# Patient Record
Sex: Female | Born: 1955 | Race: White | Hispanic: No | Smoking: Former smoker
Health system: Southern US, Community
[De-identification: ages and names within clinical notes are randomized; demographics above are authoritative.]

## PROBLEM LIST (undated history)

## (undated) DIAGNOSIS — I214 Non-ST elevation (NSTEMI) myocardial infarction: Secondary | ICD-10-CM

## (undated) DIAGNOSIS — H269 Unspecified cataract: Secondary | ICD-10-CM

## (undated) DIAGNOSIS — J9601 Acute respiratory failure with hypoxia: Secondary | ICD-10-CM

## (undated) DIAGNOSIS — I10 Essential (primary) hypertension: Secondary | ICD-10-CM

## (undated) DIAGNOSIS — N814 Uterovaginal prolapse, unspecified: Secondary | ICD-10-CM

## (undated) DIAGNOSIS — E860 Dehydration: Secondary | ICD-10-CM

## (undated) DIAGNOSIS — D649 Anemia, unspecified: Secondary | ICD-10-CM

## (undated) DIAGNOSIS — Z9289 Personal history of other medical treatment: Secondary | ICD-10-CM

## (undated) DIAGNOSIS — N179 Acute kidney failure, unspecified: Secondary | ICD-10-CM

## (undated) DIAGNOSIS — K429 Umbilical hernia without obstruction or gangrene: Secondary | ICD-10-CM

## (undated) DIAGNOSIS — E785 Hyperlipidemia, unspecified: Secondary | ICD-10-CM

## (undated) DIAGNOSIS — M1710 Unilateral primary osteoarthritis, unspecified knee: Secondary | ICD-10-CM

## (undated) DIAGNOSIS — G4730 Sleep apnea, unspecified: Secondary | ICD-10-CM

## (undated) DIAGNOSIS — Z955 Presence of coronary angioplasty implant and graft: Secondary | ICD-10-CM

## (undated) DIAGNOSIS — I639 Cerebral infarction, unspecified: Secondary | ICD-10-CM

## (undated) DIAGNOSIS — E119 Type 2 diabetes mellitus without complications: Secondary | ICD-10-CM

## (undated) DIAGNOSIS — I25110 Atherosclerotic heart disease of native coronary artery with unstable angina pectoris: Secondary | ICD-10-CM

## (undated) HISTORY — DX: Acute kidney failure, unspecified: N17.9

## (undated) HISTORY — DX: Essential (primary) hypertension: I10

## (undated) HISTORY — PX: COLONOSCOPY: SHX174

## (undated) HISTORY — DX: Dehydration: E86.0

## (undated) HISTORY — DX: Acute respiratory failure with hypoxia: J96.01

## (undated) HISTORY — PX: TUBAL LIGATION: SHX77

## (undated) HISTORY — DX: Umbilical hernia without obstruction or gangrene: K42.9

## (undated) HISTORY — DX: Unilateral primary osteoarthritis, unspecified knee: M17.10

## (undated) HISTORY — DX: Uterovaginal prolapse, unspecified: N81.4

## (undated) HISTORY — DX: Type 2 diabetes mellitus without complications: E11.9

## (undated) HISTORY — DX: Sleep apnea, unspecified: G47.30

## (undated) HISTORY — DX: Hyperlipidemia, unspecified: E78.5

## (undated) HISTORY — DX: Unspecified cataract: H26.9

---

## 1997-06-28 ENCOUNTER — Emergency Department (HOSPITAL_COMMUNITY): Admission: EM | Admit: 1997-06-28 | Discharge: 1997-06-29 | Payer: Self-pay | Admitting: Emergency Medicine

## 1998-05-15 ENCOUNTER — Emergency Department (HOSPITAL_COMMUNITY): Admission: EM | Admit: 1998-05-15 | Discharge: 1998-05-15 | Payer: Self-pay | Admitting: Emergency Medicine

## 1999-03-03 ENCOUNTER — Emergency Department (HOSPITAL_COMMUNITY): Admission: EM | Admit: 1999-03-03 | Discharge: 1999-03-03 | Payer: Self-pay | Admitting: Emergency Medicine

## 2004-08-02 ENCOUNTER — Ambulatory Visit: Payer: Self-pay | Admitting: Internal Medicine

## 2004-08-06 ENCOUNTER — Ambulatory Visit: Payer: Self-pay | Admitting: *Deleted

## 2004-09-06 ENCOUNTER — Ambulatory Visit: Payer: Self-pay | Admitting: Internal Medicine

## 2004-09-09 ENCOUNTER — Ambulatory Visit (HOSPITAL_COMMUNITY): Admission: RE | Admit: 2004-09-09 | Discharge: 2004-09-09 | Payer: Self-pay | Admitting: Internal Medicine

## 2004-09-25 ENCOUNTER — Inpatient Hospital Stay (HOSPITAL_COMMUNITY): Admission: EM | Admit: 2004-09-25 | Discharge: 2004-09-30 | Payer: Self-pay | Admitting: Emergency Medicine

## 2004-10-04 ENCOUNTER — Ambulatory Visit: Payer: Self-pay | Admitting: Internal Medicine

## 2004-10-10 ENCOUNTER — Encounter: Payer: Self-pay | Admitting: Obstetrics and Gynecology

## 2004-10-10 ENCOUNTER — Ambulatory Visit: Payer: Self-pay | Admitting: Obstetrics and Gynecology

## 2004-10-22 ENCOUNTER — Ambulatory Visit (HOSPITAL_COMMUNITY): Admission: RE | Admit: 2004-10-22 | Discharge: 2004-10-22 | Payer: Self-pay | Admitting: Urology

## 2005-01-17 ENCOUNTER — Ambulatory Visit: Payer: Self-pay | Admitting: Internal Medicine

## 2005-01-21 ENCOUNTER — Ambulatory Visit (HOSPITAL_COMMUNITY): Admission: RE | Admit: 2005-01-21 | Discharge: 2005-01-21 | Payer: Self-pay | Admitting: Internal Medicine

## 2005-02-07 ENCOUNTER — Ambulatory Visit: Payer: Self-pay | Admitting: Internal Medicine

## 2005-04-14 ENCOUNTER — Ambulatory Visit: Payer: Self-pay | Admitting: Internal Medicine

## 2005-05-15 ENCOUNTER — Ambulatory Visit: Payer: Self-pay | Admitting: *Deleted

## 2006-01-20 DIAGNOSIS — N814 Uterovaginal prolapse, unspecified: Secondary | ICD-10-CM

## 2006-01-20 HISTORY — DX: Uterovaginal prolapse, unspecified: N81.4

## 2006-03-19 ENCOUNTER — Ambulatory Visit: Payer: Self-pay | Admitting: Internal Medicine

## 2006-04-02 ENCOUNTER — Ambulatory Visit: Payer: Self-pay | Admitting: Internal Medicine

## 2006-04-03 ENCOUNTER — Ambulatory Visit (HOSPITAL_COMMUNITY): Admission: RE | Admit: 2006-04-03 | Discharge: 2006-04-03 | Payer: Self-pay | Admitting: Internal Medicine

## 2006-04-16 ENCOUNTER — Ambulatory Visit: Payer: Self-pay | Admitting: Internal Medicine

## 2006-04-30 ENCOUNTER — Encounter (HOSPITAL_COMMUNITY): Admission: RE | Admit: 2006-04-30 | Discharge: 2006-05-01 | Payer: Self-pay | Admitting: Cardiology

## 2006-05-07 ENCOUNTER — Ambulatory Visit: Payer: Self-pay | Admitting: Internal Medicine

## 2006-05-14 ENCOUNTER — Ambulatory Visit (HOSPITAL_COMMUNITY): Admission: RE | Admit: 2006-05-14 | Discharge: 2006-05-14 | Payer: Self-pay | Admitting: Cardiology

## 2006-05-21 ENCOUNTER — Ambulatory Visit: Payer: Self-pay | Admitting: Family Medicine

## 2006-05-25 ENCOUNTER — Emergency Department (HOSPITAL_COMMUNITY): Admission: EM | Admit: 2006-05-25 | Discharge: 2006-05-25 | Payer: Self-pay | Admitting: Emergency Medicine

## 2006-10-07 ENCOUNTER — Encounter (INDEPENDENT_AMBULATORY_CARE_PROVIDER_SITE_OTHER): Payer: Self-pay | Admitting: *Deleted

## 2007-04-02 ENCOUNTER — Ambulatory Visit: Payer: Self-pay | Admitting: Nurse Practitioner

## 2007-04-02 DIAGNOSIS — E785 Hyperlipidemia, unspecified: Secondary | ICD-10-CM

## 2007-04-02 DIAGNOSIS — M1710 Unilateral primary osteoarthritis, unspecified knee: Secondary | ICD-10-CM

## 2007-04-02 DIAGNOSIS — I10 Essential (primary) hypertension: Secondary | ICD-10-CM

## 2007-04-02 DIAGNOSIS — E1169 Type 2 diabetes mellitus with other specified complication: Secondary | ICD-10-CM

## 2007-04-02 DIAGNOSIS — E1159 Type 2 diabetes mellitus with other circulatory complications: Secondary | ICD-10-CM | POA: Diagnosis present

## 2007-04-05 LAB — CONVERTED CEMR LAB
ALT: 24 units/L (ref 0–35)
AST: 22 units/L (ref 0–37)
Albumin: 4.1 g/dL (ref 3.5–5.2)
Alkaline Phosphatase: 97 units/L (ref 39–117)
BUN: 11 mg/dL (ref 6–23)
Basophils Absolute: 0 10*3/uL (ref 0.0–0.1)
Basophils Relative: 1 % (ref 0–1)
CO2: 25 meq/L (ref 19–32)
Calcium: 9 mg/dL (ref 8.4–10.5)
Chloride: 100 meq/L (ref 96–112)
Creatinine, Ser: 0.95 mg/dL (ref 0.40–1.20)
Eosinophils Absolute: 0.4 10*3/uL (ref 0.0–0.7)
Eosinophils Relative: 6 % — ABNORMAL HIGH (ref 0–5)
Glucose, Bld: 192 mg/dL — ABNORMAL HIGH (ref 70–99)
HCT: 44.9 % (ref 36.0–46.0)
Hemoglobin: 14.1 g/dL (ref 12.0–15.0)
Lymphocytes Relative: 36 % (ref 12–46)
Lymphs Abs: 2.3 10*3/uL (ref 0.7–4.0)
MCHC: 31.4 g/dL (ref 30.0–36.0)
MCV: 89.1 fL (ref 78.0–100.0)
Monocytes Absolute: 0.5 10*3/uL (ref 0.1–1.0)
Monocytes Relative: 8 % (ref 3–12)
Neutro Abs: 3 10*3/uL (ref 1.7–7.7)
Neutrophils Relative %: 48 % (ref 43–77)
Platelets: 248 10*3/uL (ref 150–400)
Potassium: 4.1 meq/L (ref 3.5–5.3)
RBC: 5.04 M/uL (ref 3.87–5.11)
RDW: 14.9 % (ref 11.5–15.5)
Sodium: 139 meq/L (ref 135–145)
TSH: 1.107 microintl units/mL (ref 0.350–5.50)
Total Bilirubin: 0.3 mg/dL (ref 0.3–1.2)
Total Protein: 7.5 g/dL (ref 6.0–8.3)
WBC: 6.2 10*3/uL (ref 4.0–10.5)

## 2007-04-19 ENCOUNTER — Ambulatory Visit: Payer: Self-pay | Admitting: Nurse Practitioner

## 2007-04-19 LAB — CONVERTED CEMR LAB
Blood Glucose, AC Bkfst: 221 mg/dL
Hgb A1c MFr Bld: 9.8 %

## 2007-04-20 LAB — CONVERTED CEMR LAB
Cholesterol: 212 mg/dL — ABNORMAL HIGH (ref 0–200)
HDL: 35 mg/dL — ABNORMAL LOW (ref >39)
LDL Cholesterol: 142 mg/dL — ABNORMAL HIGH (ref 0–99)
Total CHOL/HDL Ratio: 6.1
Triglycerides: 173 mg/dL — ABNORMAL HIGH (ref <150)
VLDL: 35 mg/dL (ref 0–40)

## 2007-04-21 ENCOUNTER — Telehealth (INDEPENDENT_AMBULATORY_CARE_PROVIDER_SITE_OTHER): Payer: Self-pay | Admitting: *Deleted

## 2007-05-25 ENCOUNTER — Ambulatory Visit: Payer: Self-pay | Admitting: Internal Medicine

## 2007-05-25 DIAGNOSIS — E119 Type 2 diabetes mellitus without complications: Secondary | ICD-10-CM

## 2007-05-25 DIAGNOSIS — Z794 Long term (current) use of insulin: Secondary | ICD-10-CM

## 2007-05-25 DIAGNOSIS — E1165 Type 2 diabetes mellitus with hyperglycemia: Secondary | ICD-10-CM

## 2007-05-25 LAB — CONVERTED CEMR LAB: Blood Glucose, Fingerstick: 225

## 2007-05-31 ENCOUNTER — Ambulatory Visit: Payer: Self-pay | Admitting: Internal Medicine

## 2007-06-06 LAB — CONVERTED CEMR LAB
BUN: 19 mg/dL (ref 6–23)
CO2: 24 meq/L (ref 19–32)
Calcium: 9 mg/dL (ref 8.4–10.5)
Chloride: 101 meq/L (ref 96–112)
Creatinine, Ser: 0.98 mg/dL (ref 0.40–1.20)
Glucose, Bld: 151 mg/dL — ABNORMAL HIGH (ref 70–99)
Potassium: 4.3 meq/L (ref 3.5–5.3)
Sodium: 138 meq/L (ref 135–145)

## 2007-06-07 ENCOUNTER — Encounter (INDEPENDENT_AMBULATORY_CARE_PROVIDER_SITE_OTHER): Payer: Self-pay | Admitting: *Deleted

## 2007-06-08 ENCOUNTER — Ambulatory Visit: Payer: Self-pay | Admitting: Internal Medicine

## 2007-06-22 ENCOUNTER — Ambulatory Visit: Payer: Self-pay | Admitting: Family Medicine

## 2007-06-22 ENCOUNTER — Encounter (INDEPENDENT_AMBULATORY_CARE_PROVIDER_SITE_OTHER): Payer: Self-pay | Admitting: Internal Medicine

## 2007-08-19 ENCOUNTER — Ambulatory Visit: Payer: Self-pay | Admitting: Internal Medicine

## 2007-08-19 LAB — CONVERTED CEMR LAB
Blood Glucose, Fingerstick: 203
Hgb A1c MFr Bld: 9.5 %

## 2007-10-05 ENCOUNTER — Ambulatory Visit: Payer: Self-pay | Admitting: Internal Medicine

## 2007-10-05 LAB — CONVERTED CEMR LAB
Bilirubin Urine: NEGATIVE
Blood Glucose, Fingerstick: 141
Blood in Urine, dipstick: NEGATIVE
Glucose, Urine, Semiquant: NEGATIVE
Ketones, urine, test strip: NEGATIVE
Nitrite: NEGATIVE
Protein, U semiquant: NEGATIVE
Specific Gravity, Urine: <1.005
Urobilinogen, UA: 0.2
WBC Urine, dipstick: NEGATIVE
pH: 7

## 2007-10-14 LAB — CONVERTED CEMR LAB
ALT: 22 units/L (ref 0–35)
AST: 17 units/L (ref 0–37)
Albumin: 4.2 g/dL (ref 3.5–5.2)
Alkaline Phosphatase: 82 units/L (ref 39–117)
Bilirubin, Direct: 0.1 mg/dL (ref 0.0–0.3)
Cholesterol: 126 mg/dL (ref 0–200)
HDL: 39 mg/dL — ABNORMAL LOW (ref >39)
Indirect Bilirubin: 0.3 mg/dL (ref 0.0–0.9)
LDL Cholesterol: 55 mg/dL (ref 0–99)
Microalb, Ur: 2.1 mg/dL — ABNORMAL HIGH (ref 0.00–1.89)
Total Bilirubin: 0.4 mg/dL (ref 0.3–1.2)
Total CHOL/HDL Ratio: 3.2
Total Protein: 7.4 g/dL (ref 6.0–8.3)
Triglycerides: 160 mg/dL — ABNORMAL HIGH (ref <150)
VLDL: 32 mg/dL (ref 0–40)

## 2007-10-21 ENCOUNTER — Encounter: Admission: RE | Admit: 2007-10-21 | Discharge: 2007-12-02 | Payer: Self-pay | Admitting: Internal Medicine

## 2007-10-21 ENCOUNTER — Encounter (INDEPENDENT_AMBULATORY_CARE_PROVIDER_SITE_OTHER): Payer: Self-pay | Admitting: Internal Medicine

## 2007-12-02 ENCOUNTER — Encounter (INDEPENDENT_AMBULATORY_CARE_PROVIDER_SITE_OTHER): Payer: Self-pay | Admitting: Internal Medicine

## 2008-02-18 ENCOUNTER — Ambulatory Visit: Payer: Self-pay | Admitting: Internal Medicine

## 2008-05-15 ENCOUNTER — Encounter (INDEPENDENT_AMBULATORY_CARE_PROVIDER_SITE_OTHER): Payer: Self-pay | Admitting: Internal Medicine

## 2008-06-21 ENCOUNTER — Encounter (INDEPENDENT_AMBULATORY_CARE_PROVIDER_SITE_OTHER): Payer: Self-pay | Admitting: Internal Medicine

## 2008-07-07 ENCOUNTER — Encounter (INDEPENDENT_AMBULATORY_CARE_PROVIDER_SITE_OTHER): Payer: Self-pay | Admitting: Internal Medicine

## 2008-07-07 ENCOUNTER — Ambulatory Visit: Payer: Self-pay | Admitting: Internal Medicine

## 2008-07-07 DIAGNOSIS — F329 Major depressive disorder, single episode, unspecified: Secondary | ICD-10-CM

## 2008-07-07 LAB — CONVERTED CEMR LAB
Bilirubin Urine: NEGATIVE
Blood Glucose, Fingerstick: 99
Blood in Urine, dipstick: NEGATIVE
Chlamydia, DNA Probe: NEGATIVE
GC Probe Amp, Genital: NEGATIVE
Glucose, Urine, Semiquant: NEGATIVE
Hgb A1c MFr Bld: 8.7 %
Ketones, urine, test strip: NEGATIVE
Nitrite: NEGATIVE
Protein, U semiquant: 100
Specific Gravity, Urine: 1.01
Urobilinogen, UA: 0.2
WBC Urine, dipstick: NEGATIVE
pH: 7.5

## 2008-07-13 ENCOUNTER — Encounter (INDEPENDENT_AMBULATORY_CARE_PROVIDER_SITE_OTHER): Payer: Self-pay | Admitting: Internal Medicine

## 2008-07-21 ENCOUNTER — Ambulatory Visit (HOSPITAL_COMMUNITY): Admission: RE | Admit: 2008-07-21 | Discharge: 2008-07-21 | Payer: Self-pay | Admitting: Internal Medicine

## 2008-08-09 ENCOUNTER — Ambulatory Visit: Payer: Self-pay | Admitting: Internal Medicine

## 2008-08-21 LAB — CONVERTED CEMR LAB
ALT: 27 U/L
AST: 25 U/L
Albumin: 3.9 g/dL
Alkaline Phosphatase: 76 U/L
BUN: 11 mg/dL
Basophils Absolute: 0.1 10*3/uL
Basophils Relative: 1 %
CO2: 27 meq/L
Calcium: 9.2 mg/dL
Chloride: 105 meq/L
Cholesterol: 207 mg/dL — ABNORMAL HIGH
Creatinine, Ser: 0.94 mg/dL
Eosinophils Absolute: 0.2 10*3/uL
Eosinophils Relative: 4 %
Glucose, Bld: 155 mg/dL — ABNORMAL HIGH
HCT: 43.3 %
HDL: 39 mg/dL — ABNORMAL LOW
Hemoglobin: 13.6 g/dL
LDL Cholesterol: 133 mg/dL — ABNORMAL HIGH
Lymphocytes Relative: 41 %
Lymphs Abs: 2.5 10*3/uL
MCHC: 31.4 g/dL
MCV: 89.5 fL
Microalb, Ur: 8.82 mg/dL — ABNORMAL HIGH
Monocytes Absolute: 0.5 10*3/uL
Monocytes Relative: 8 %
Neutro Abs: 2.9 10*3/uL
Neutrophils Relative %: 47 %
Platelets: 261 10*3/uL
Potassium: 4.4 meq/L
RBC: 4.84 M/uL
RDW: 16.2 % — ABNORMAL HIGH
Sodium: 142 meq/L
Total Bilirubin: 0.4 mg/dL
Total CHOL/HDL Ratio: 5.3
Total Protein: 7.2 g/dL
Triglycerides: 173 mg/dL — ABNORMAL HIGH
VLDL: 35 mg/dL
WBC: 6.2 10*3/uL

## 2008-09-07 ENCOUNTER — Ambulatory Visit: Payer: Self-pay | Admitting: Internal Medicine

## 2008-09-07 LAB — CONVERTED CEMR LAB: Blood Glucose, Fingerstick: 176

## 2008-09-21 ENCOUNTER — Ambulatory Visit: Payer: Self-pay | Admitting: Internal Medicine

## 2008-09-28 ENCOUNTER — Ambulatory Visit: Payer: Self-pay | Admitting: Internal Medicine

## 2008-10-19 ENCOUNTER — Ambulatory Visit: Payer: Self-pay | Admitting: Internal Medicine

## 2008-10-19 LAB — CONVERTED CEMR LAB
ALT: 29 units/L (ref 0–35)
AST: 21 units/L (ref 0–37)
Albumin: 4.1 g/dL (ref 3.5–5.2)
Alkaline Phosphatase: 71 units/L (ref 39–117)
Bilirubin, Direct: 0.1 mg/dL (ref 0.0–0.3)
Cholesterol: 113 mg/dL (ref 0–200)
HDL: 39 mg/dL — ABNORMAL LOW (ref >39)
Indirect Bilirubin: 0.3 mg/dL (ref 0.0–0.9)
LDL Cholesterol: 54 mg/dL (ref 0–99)
Total Bilirubin: 0.4 mg/dL (ref 0.3–1.2)
Total CHOL/HDL Ratio: 2.9
Total Protein: 7.2 g/dL (ref 6.0–8.3)
Triglycerides: 100 mg/dL (ref <150)
VLDL: 20 mg/dL (ref 0–40)

## 2008-11-01 ENCOUNTER — Encounter (INDEPENDENT_AMBULATORY_CARE_PROVIDER_SITE_OTHER): Payer: Self-pay | Admitting: Internal Medicine

## 2009-01-20 HISTORY — PX: CARDIAC CATHETERIZATION: SHX172

## 2009-01-26 ENCOUNTER — Ambulatory Visit: Payer: Self-pay | Admitting: Internal Medicine

## 2009-01-26 DIAGNOSIS — K439 Ventral hernia without obstruction or gangrene: Secondary | ICD-10-CM | POA: Insufficient documentation

## 2009-01-26 HISTORY — DX: Ventral hernia without obstruction or gangrene: K43.9

## 2009-01-26 LAB — CONVERTED CEMR LAB: Blood Glucose, Fingerstick: 96

## 2009-01-31 LAB — CONVERTED CEMR LAB
Hgb A1c MFr Bld: 8.3 % — ABNORMAL HIGH (ref 4.6–6.1)
Microalb, Ur: 13.01 mg/dL — ABNORMAL HIGH (ref 0.00–1.89)

## 2009-02-09 ENCOUNTER — Ambulatory Visit: Payer: Self-pay | Admitting: Internal Medicine

## 2009-02-23 LAB — CONVERTED CEMR LAB
BUN: 15 mg/dL (ref 6–23)
CO2: 25 meq/L (ref 19–32)
Calcium: 9.4 mg/dL (ref 8.4–10.5)
Chloride: 103 meq/L (ref 96–112)
Creatinine, Ser: 0.91 mg/dL (ref 0.40–1.20)
Glucose, Bld: 91 mg/dL (ref 70–99)
Potassium: 4.1 meq/L (ref 3.5–5.3)
Sodium: 141 meq/L (ref 135–145)

## 2009-03-05 ENCOUNTER — Ambulatory Visit (HOSPITAL_COMMUNITY): Admission: RE | Admit: 2009-03-05 | Discharge: 2009-03-05 | Payer: Self-pay | Admitting: Family Medicine

## 2009-03-09 ENCOUNTER — Ambulatory Visit: Payer: Self-pay | Admitting: Internal Medicine

## 2009-03-09 LAB — CONVERTED CEMR LAB: Blood Glucose, Fingerstick: 100

## 2009-03-23 ENCOUNTER — Ambulatory Visit: Payer: Self-pay | Admitting: Internal Medicine

## 2009-03-23 LAB — CONVERTED CEMR LAB
BUN: 15 mg/dL (ref 6–23)
CO2: 29 meq/L (ref 19–32)
Calcium: 8.8 mg/dL (ref 8.4–10.5)
Chloride: 102 meq/L (ref 96–112)
Creatinine, Ser: 0.84 mg/dL (ref 0.40–1.20)
Glucose, Bld: 117 mg/dL — ABNORMAL HIGH (ref 70–99)
Potassium: 3.7 meq/L (ref 3.5–5.3)
Sodium: 142 meq/L (ref 135–145)

## 2009-03-26 ENCOUNTER — Encounter: Admission: RE | Admit: 2009-03-26 | Discharge: 2009-03-26 | Payer: Self-pay | Admitting: Internal Medicine

## 2009-03-27 ENCOUNTER — Encounter (INDEPENDENT_AMBULATORY_CARE_PROVIDER_SITE_OTHER): Payer: Self-pay | Admitting: Internal Medicine

## 2009-03-30 ENCOUNTER — Telehealth (INDEPENDENT_AMBULATORY_CARE_PROVIDER_SITE_OTHER): Payer: Self-pay | Admitting: Internal Medicine

## 2009-04-06 ENCOUNTER — Ambulatory Visit: Payer: Self-pay | Admitting: Internal Medicine

## 2009-08-10 ENCOUNTER — Telehealth (INDEPENDENT_AMBULATORY_CARE_PROVIDER_SITE_OTHER): Payer: Self-pay | Admitting: Internal Medicine

## 2009-08-10 ENCOUNTER — Ambulatory Visit: Payer: Self-pay | Admitting: Internal Medicine

## 2009-08-10 DIAGNOSIS — G4733 Obstructive sleep apnea (adult) (pediatric): Secondary | ICD-10-CM

## 2009-08-10 DIAGNOSIS — E049 Nontoxic goiter, unspecified: Secondary | ICD-10-CM | POA: Insufficient documentation

## 2009-08-10 LAB — CONVERTED CEMR LAB
Bilirubin Urine: NEGATIVE
Blood Glucose, Fingerstick: 97
Blood in Urine, dipstick: NEGATIVE
Chlamydia, DNA Probe: NEGATIVE
GC Probe Amp, Genital: NEGATIVE
Glucose, Urine, Semiquant: NEGATIVE
Hgb A1c MFr Bld: 7.7 %
KOH Prep: NEGATIVE
Ketones, urine, test strip: NEGATIVE
Microalb, Ur: 11.07 mg/dL — ABNORMAL HIGH (ref 0.00–1.89)
Nitrite: NEGATIVE
Specific Gravity, Urine: 1.01
TSH: 1.175 microintl units/mL (ref 0.350–4.500)
Urobilinogen, UA: NEGATIVE
WBC Urine, dipstick: NEGATIVE
Whiff Test: POSITIVE
pH: 6.5

## 2009-08-14 ENCOUNTER — Encounter (INDEPENDENT_AMBULATORY_CARE_PROVIDER_SITE_OTHER): Payer: Self-pay | Admitting: Internal Medicine

## 2009-08-14 ENCOUNTER — Ambulatory Visit (HOSPITAL_COMMUNITY): Admission: RE | Admit: 2009-08-14 | Discharge: 2009-08-14 | Payer: Self-pay | Admitting: Internal Medicine

## 2009-08-15 ENCOUNTER — Encounter (INDEPENDENT_AMBULATORY_CARE_PROVIDER_SITE_OTHER): Payer: Self-pay | Admitting: Internal Medicine

## 2009-08-17 ENCOUNTER — Ambulatory Visit (HOSPITAL_COMMUNITY): Admission: RE | Admit: 2009-08-17 | Discharge: 2009-08-17 | Payer: Self-pay | Admitting: Internal Medicine

## 2009-08-18 ENCOUNTER — Encounter (INDEPENDENT_AMBULATORY_CARE_PROVIDER_SITE_OTHER): Payer: Self-pay | Admitting: Internal Medicine

## 2009-08-29 ENCOUNTER — Encounter (INDEPENDENT_AMBULATORY_CARE_PROVIDER_SITE_OTHER): Payer: Self-pay | Admitting: *Deleted

## 2009-08-31 ENCOUNTER — Ambulatory Visit: Payer: Self-pay | Admitting: Internal Medicine

## 2009-09-06 ENCOUNTER — Telehealth: Payer: Self-pay | Admitting: Internal Medicine

## 2009-09-07 ENCOUNTER — Ambulatory Visit: Payer: Self-pay | Admitting: Internal Medicine

## 2009-09-10 ENCOUNTER — Encounter: Payer: Self-pay | Admitting: Internal Medicine

## 2009-09-13 DIAGNOSIS — D126 Benign neoplasm of colon, unspecified: Secondary | ICD-10-CM

## 2009-09-13 HISTORY — DX: Benign neoplasm of colon, unspecified: D12.6

## 2009-09-14 ENCOUNTER — Ambulatory Visit: Payer: Self-pay | Admitting: Internal Medicine

## 2009-09-14 LAB — CONVERTED CEMR LAB: Blood Glucose, Fingerstick: 114

## 2009-10-05 ENCOUNTER — Emergency Department (HOSPITAL_COMMUNITY): Admission: EM | Admit: 2009-10-05 | Discharge: 2009-10-05 | Payer: Self-pay | Admitting: Emergency Medicine

## 2009-10-11 ENCOUNTER — Ambulatory Visit (HOSPITAL_BASED_OUTPATIENT_CLINIC_OR_DEPARTMENT_OTHER): Admission: RE | Admit: 2009-10-11 | Discharge: 2009-10-11 | Payer: Self-pay | Admitting: Internal Medicine

## 2009-10-11 ENCOUNTER — Encounter (INDEPENDENT_AMBULATORY_CARE_PROVIDER_SITE_OTHER): Payer: Self-pay | Admitting: Internal Medicine

## 2009-10-12 ENCOUNTER — Encounter (INDEPENDENT_AMBULATORY_CARE_PROVIDER_SITE_OTHER): Payer: Self-pay | Admitting: Internal Medicine

## 2009-10-13 ENCOUNTER — Ambulatory Visit: Payer: Self-pay | Admitting: Internal Medicine

## 2009-10-22 ENCOUNTER — Ambulatory Visit: Payer: Self-pay | Admitting: Internal Medicine

## 2009-10-26 ENCOUNTER — Ambulatory Visit: Payer: Self-pay | Admitting: Internal Medicine

## 2009-11-24 ENCOUNTER — Encounter (INDEPENDENT_AMBULATORY_CARE_PROVIDER_SITE_OTHER): Payer: Self-pay | Admitting: Internal Medicine

## 2009-11-28 ENCOUNTER — Encounter (INDEPENDENT_AMBULATORY_CARE_PROVIDER_SITE_OTHER): Payer: Self-pay | Admitting: Internal Medicine

## 2010-01-02 ENCOUNTER — Encounter (INDEPENDENT_AMBULATORY_CARE_PROVIDER_SITE_OTHER): Payer: Self-pay | Admitting: Internal Medicine

## 2010-01-18 ENCOUNTER — Ambulatory Visit: Payer: Self-pay | Admitting: Internal Medicine

## 2010-01-18 LAB — CONVERTED CEMR LAB
Blood Glucose, Fingerstick: 114
Cholesterol, target level: 200 mg/dL
Hgb A1c MFr Bld: 7.5 %

## 2010-02-01 ENCOUNTER — Ambulatory Visit
Admission: RE | Admit: 2010-02-01 | Discharge: 2010-02-01 | Payer: Self-pay | Source: Home / Self Care | Attending: Internal Medicine | Admitting: Internal Medicine

## 2010-02-01 ENCOUNTER — Encounter (INDEPENDENT_AMBULATORY_CARE_PROVIDER_SITE_OTHER): Payer: Self-pay | Admitting: Internal Medicine

## 2010-02-01 LAB — CONVERTED CEMR LAB
AST: 19 units/L (ref 0–37)
Albumin: 4.3 g/dL (ref 3.5–5.2)
Alkaline Phosphatase: 55 units/L (ref 39–117)
BUN: 15 mg/dL (ref 6–23)
Basophils Absolute: 0 10*3/uL (ref 0.0–0.1)
Basophils Relative: 1 % (ref 0–1)
Eosinophils Absolute: 0.3 10*3/uL (ref 0.0–0.7)
HDL: 40 mg/dL (ref >39)
LDL Cholesterol: 136 mg/dL — ABNORMAL HIGH (ref 0–99)
MCHC: 31.8 g/dL (ref 30.0–36.0)
MCV: 88.8 fL (ref 78.0–100.0)
Monocytes Relative: 8 % (ref 3–12)
Neutrophils Relative %: 46 % (ref 43–77)
Platelets: 250 10*3/uL (ref 150–400)
Potassium: 4.2 meq/L (ref 3.5–5.3)
RBC: 4.74 M/uL (ref 3.87–5.11)
RDW: 15 % (ref 11.5–15.5)
Total CHOL/HDL Ratio: 5.3

## 2010-02-19 NOTE — Miscellaneous (Signed)
Summary: [facility]  Clinical Lists Changes  Medications: Added new medication of MIRALAX   POWD (POLYETHYLENE GLYCOL 3350) As per prep  instructions. - Signed Added new medication of DULCOLAX 5 MG  TBEC (BISACODYL) Day before procedure take 2 at 3pm and 2 at 8pm. - Signed Added new medication of REGLAN 10 MG  TABS (METOCLOPRAMIDE HCL) As per prep instructions. - Signed Rx of MIRALAX   POWD (POLYETHYLENE GLYCOL 3350) As per prep  instructions.;  #255gm x 0;  Signed;  Entered by: Ezra Sites RN;  Authorized by: Hart Carwin MD;  Method used: Print then Give to Patient Rx of DULCOLAX 5 MG  TBEC (BISACODYL) Day before procedure take 2 at 3pm and 2 at 8pm.;  #4 x 0;  Signed;  Entered by: Ezra Sites RN;  Authorized by: Hart Carwin MD;  Method used: Print then Give to Patient Rx of REGLAN 10 MG  TABS (METOCLOPRAMIDE HCL) As per prep instructions.;  #2 x 0;  Signed;  Entered by: Ezra Sites RN;  Authorized by: Hart Carwin MD;  Method used: Print then Give to Patient Observations: Added new observation of NKA: T (08/31/2009 10:52)    Prescriptions: REGLAN 10 MG  TABS (METOCLOPRAMIDE HCL) As per prep instructions.  #2 x 0   Entered by:   Ezra Sites RN   Authorized by:   Hart Carwin MD   Signed by:   Ezra Sites RN on 08/31/2009   Method used:   Print then Give to Patient   RxID:   [id number] DULCOLAX 5 MG  TBEC (BISACODYL) Day before procedure take 2 at 3pm and 2 at 8pm.  #4 x 0   Entered by:   Ezra Sites RN   Authorized by:   Hart Carwin MD   Signed by:   Ezra Sites RN on 08/31/2009   Method used:   Print then Give to Patient   RxID:   [id number] MIRALAX   POWD (POLYETHYLENE GLYCOL 3350) As per prep  instructions.  #255gm x 0   Entered by:   Ezra Sites RN   Authorized by:   Hart Carwin MD   Signed by:   Ezra Sites RN on 08/31/2009   Method used:   Print then Give to Patient   RxID:   [id number]

## 2010-02-19 NOTE — Letter (Signed)
Summary: Diabetic Instructions  [facility]  [address] [place], [place] [address]   Phone: [phone]  Fax: [phone]    Holly Acosta [birth year] MRN: [id number]   _  _   ORAL DIABETIC MEDICATION INSTRUCTIONS  The day before your procedure:   Take your diabetic pill as you do normally  The day of your procedure:   Do not take your diabetic pill    We will check your blood sugar levels during the admission process and again in Recovery before discharging you home  ________________________________________________________________________

## 2010-02-19 NOTE — Progress Notes (Signed)
Summary: Office Visit//DEPRESSION SCREENNING  Office Visit//DEPRESSION SCREENNING   Imported By: Arta Bruce 08/13/2009 09:29:10  _____________________________________________________________________  External Attachment:    Type:   Image     Comment:   External Document

## 2010-02-19 NOTE — Assessment & Plan Note (Signed)
Summary: FOLLOW UP WITH BMET/ V58.69 IN 2-3 WEEKS NURSE VISIT//GK  Nurse Visit   Allergies: No Known Drug Allergies

## 2010-02-19 NOTE — Assessment & Plan Note (Signed)
Summary: FOLLOW UP WITH DR Little Bashore IN 6 WEEKS BP//GK   Vital Signs:  Patient profile:   55 year old female Weight:      189 pounds Temp:     97.8 degrees F Pulse rate:   54 / minute Pulse rhythm:   regular Resp:     18 per minute BP sitting:   157 / 81  (left arm) Cuff size:   large  Vitals Entered By: Vesta Mixer CMA (March 09, 2009 11:15 AM) CC: f/u bp Is Patient Diabetic? Yes Pain Assessment Patient in pain? no      CBG Result 100  Does patient need assistance? Ambulation Normal   Primary Care Provider:  Yuval Rubens  CC:  f/u bp.  History of Present Illness: 1.  Hypertension:  Has been on 20 mg of Lisinopril for about 1 month.  Tolerated increase in Lisinopril fine.  2.  DM:  A1C as expected too high at last visit.  Pt. lost about 10 lb s since then working on diet.  Exercising on bike as well.  Taking meds regularly.  Current Medications (verified): 1)  Atenolol 50 Mg Tabs (Atenolol) .... Take One (1) Tablet Each Day 2)  Norvasc 10 Mg Tabs (Amlodipine Besylate) .... Take One (1) Tablet By Mouth Every Day 3)  Glucophage 500 Mg  Tabs (Metformin Hcl) .... 2 Tablets By Mouth Two Times A Day 4)  Glucometer Elite Classic   Kit (Blood Glucose Monitoring Suppl) .... Use To Check Sugars Two Times A Day 5)  Glucometer Elite Test   Strp (Glucose Blood) .... Two Times A Day Sugar Testing 6)  Lisinopril 20 Mg Tabs (Lisinopril) .Marland Kitchen.. 1 Tab By Mouth Daily 7)  Crestor 10 Mg  Tabs (Rosuvastatin Calcium) .Marland Kitchen.. 1 Tab By Mouth Daily 8)  Ibuprofen 600 Mg  Tabs (Ibuprofen) .Marland Kitchen.. 1 Tab By Mouth Q 6h With Food As Needed Pain 9)  Lovaza 1 Gm Caps (Omega-3-Acid Ethyl Esters) .... 4 Caps By Mouth Daily 10)  Glucotrol Xl 5 Mg Xr24h-Tab (Glipizide) .Marland Kitchen.. 1 Tab By Mouth Q Am  Allergies (verified): No Known Drug Allergies  Physical Exam  Lungs:  Normal respiratory effort, chest expands symmetrically. Lungs are clear to auscultation, no crackles or wheezes. Heart:  Normal rate and regular  rhythm. S1 and S2 normal without gallop, murmur, click, rub or other extra sounds.  Radial pulses normal and equal   Impression & Recommendations:  Problem # 1:  CRACKLES BILATERAL LUNG BASES (ICD-793.1) Resolved--CXR normal as well  Problem # 2:  DIABETES MELLITUS, TYPE II, UNCONTROLLED (ICD-250.02) Continue to work on lifestyle Her updated medication list for this problem includes:    Glucophage 500 Mg Tabs (Metformin hcl) .Marland Kitchen... 2 tablets by mouth two times a day    Lisinopril 20 Mg Tabs (Lisinopril) .Marland Kitchen... 1 tab by mouth daily    Glucotrol Xl 5 Mg Xr24h-tab (Glipizide) .Marland Kitchen... 1 tab by mouth q am  Problem # 3:  HYPERTENSION, BENIGN ESSENTIAL (ICD-401.1) Add HCTZ Her updated medication list for this problem includes:    Atenolol 50 Mg Tabs (Atenolol) .Marland Kitchen... Take one (1) tablet each day    Norvasc 10 Mg Tabs (Amlodipine besylate) .Marland Kitchen... Take one (1) tablet by mouth every day    Lisinopril 20 Mg Tabs (Lisinopril) .Marland Kitchen... 1 tab by mouth daily    Hydrochlorothiazide 12.5 Mg Caps (Hydrochlorothiazide) .Marland Kitchen... 1 tab or cap by mouth daily in morning  Complete Medication List: 1)  Atenolol 50 Mg Tabs (Atenolol) .... Take one (  1) tablet each day 2)  Norvasc 10 Mg Tabs (Amlodipine besylate) .... Take one (1) tablet by mouth every day 3)  Glucophage 500 Mg Tabs (Metformin hcl) .... 2 tablets by mouth two times a day 4)  Glucometer Elite Classic Kit (Blood glucose monitoring suppl) .... Use to check sugars two times a day 5)  Glucometer Elite Test Strp (Glucose blood) .... Two times a day sugar testing 6)  Lisinopril 20 Mg Tabs (Lisinopril) .Marland Kitchen.. 1 tab by mouth daily 7)  Crestor 10 Mg Tabs (Rosuvastatin calcium) .Marland Kitchen.. 1 tab by mouth daily 8)  Ibuprofen 600 Mg Tabs (Ibuprofen) .Marland Kitchen.. 1 tab by mouth q 6h with food as needed pain 9)  Lovaza 1 Gm Caps (Omega-3-acid ethyl esters) .... 4 caps by mouth daily 10)  Glucotrol Xl 5 Mg Xr24h-tab (Glipizide) .Marland Kitchen.. 1 tab by mouth q am 11)  Hydrochlorothiazide 12.5 Mg  Caps (Hydrochlorothiazide) .Marland Kitchen.. 1 tab or cap by mouth daily in morning  Other Orders: Capillary Blood Glucose/CBG (16109)  Patient Instructions: 1)  Follow up with Dr. Delrae Alfred on or after 07/07/09 for CPP 2)  Lab visit --fasting -- 2 days prior to CPP in June:  FLP, A1C, HIV, RPR, UA, urine microalbumin, CBC, CMET 3)  BMet and bp check --nurse visit in 2 weeks Prescriptions: HYDROCHLOROTHIAZIDE 12.5 MG CAPS (HYDROCHLOROTHIAZIDE) 1 tab or cap by mouth daily in morning  #30 x 11   Entered and Authorized by:   Julieanne Manson MD   Signed by:   Julieanne Manson MD on 03/09/2009   Method used:   Print then Give to Patient   RxID:   [id number]

## 2010-02-19 NOTE — Letter (Signed)
Summary: MAILED REQUESTED RECORDS TO [facility]  MAILED REQUESTED RECORDS TO [facility]   Imported By: Arta Bruce 10/12/2009 15:33:11  _____________________________________________________________________  External Attachment:    Type:   Image     Comment:   External Document

## 2010-02-19 NOTE — Progress Notes (Signed)
Summary: Question about procedure  Phone Note Call from Patient Call back at Home Phone [phone]   Caller: Patient Call For: Dr. Juanda Chance Reason for Call: Talk to Nurse Summary of Call: pt. ate some nuts and salad on Mon./Tues. Has her COL sch'd for 09-07-09 Initial call taken by: Karna Christmas,  September 06, 2009 8:08 AM  Follow-up for Phone Call        Patient  notified to keep her appointment for 09/07/09, to be on a clear liquid diet today. Follow-up by: Darcey Nora RN, CGRN,  September 06, 2009 8:48 AM

## 2010-02-19 NOTE — Assessment & Plan Note (Signed)
Summary: F/U DYSTHYMIA 1 MONTH PER DR MULBERRY/ NS   Vital Signs:  Patient profile:   55 year old female Height:      64.50 inches Weight:      185.5 pounds Temp:     97.1 degrees F oral Pulse rate:   40 / minute Pulse rhythm:   regular Resp:     16 per minute BP sitting:   156 / 90  (left arm) Cuff size:   regular  Vitals Entered By: Michelle Nasuti (September 14, 2009 10:39 AM) CC: PT PRESENTS TO CLINIC FOR DM AND HTN FOLLOW UP Is Patient Diabetic? Yes Did you bring your meter with you today? No Pain Assessment Patient in pain? no      CBG Result 114 CBG Device ID B NONFASTING  Does patient need assistance? Functional Status Self care Ambulation Normal   Primary Care Amea Mcphail:  [facility]  CC:  PT PRESENTS TO CLINIC FOR DM AND HTN FOLLOW UP.  History of Present Illness: 1.  Depression:  Scored 14 on PHQ 9 at CPP -- see ROS last visit.  Pt. did not take Zoloft after reading possible side effects--concerned she will become a "zombie"  while taking.  Long discussion today regarding side effects.  States she is now going to a church and trying meditation--wants to give that a try before considering medication.  Has had counseling in past--but not currently.  Refused at last visit.  No suicidal ideation.  Pt. now willing to go to [facility] for counseling.    2.  Hypertension:  Pt. cannot give a clear history as to whether she has been taking meds regularly.  Looks like she did not fill meds until 8/15 for most part, but then states she has been taking meds since her last visit on 7/22.  3.  DM:  as above--not clear how long has been back on meds.  Sugars have been running in 90 to low 100 range.  4.  Concern for sleep apnea and plans for colonoscopy--scheduled for September.  Habits & Providers  Alcohol-Tobacco-Diet     Tobacco Status: never  Current Medications (verified): 1)  Atenolol 50 Mg Tabs (Atenolol) .... Take One (1) Tablet Each Day 2)  Norvasc 10 Mg Tabs  (Amlodipine Besylate) .... Take One (1) Tablet By Mouth Every Day 3)  Glucophage 500 Mg  Tabs (Metformin Hcl) .... 2 Tablets By Mouth Two Times A Day 4)  Glucometer Elite Classic   Kit (Blood Glucose Monitoring Suppl) .... Use To Check Sugars Two Times A Day 5)  Glucometer Elite Test   Strp (Glucose Blood) .... Two Times A Day Sugar Testing 6)  Lisinopril 20 Mg Tabs (Lisinopril) .Marland Kitchen.. 1 Tab By Mouth Daily 7)  Crestor 10 Mg  Tabs (Rosuvastatin Calcium) .Marland Kitchen.. 1 Tab By Mouth Daily 8)  Lovaza 1 Gm Caps (Omega-3-Acid Ethyl Esters) .... 4 Caps By Mouth Daily 9)  Glucotrol Xl 5 Mg Xr24h-Tab (Glipizide) .Marland Kitchen.. 1 Tab By Mouth Q Am 10)  Hydrochlorothiazide 25 Mg Tabs (Hydrochlorothiazide) .Marland Kitchen.. 1 Tablet By Mouth Daily 11)  Zoloft 50 Mg Tabs (Sertraline Hcl) .... 1/2 Tab By Mouth Daily For 1 Week, Then 1 Tab By Mouth Daily 12)  Dish Pessary  Misc (Misc. Devices) 13)  Miralax   Powd (Polyethylene Glycol 3350) .... As Per Prep  Instructions. 14)  Dulcolax 5 Mg  Tbec (Bisacodyl) .... Day Before Procedure Take 2 At 3pm and 2 At 8pm. 15)  Reglan 10 Mg  Tabs (Metoclopramide  Hcl) .... As Per Prep Instructions.  Allergies (verified): No Known Drug Allergies  Social History: Smoking Status:  never  Physical Exam  General:  NAD, smiling.   Impression & Recommendations:  Problem # 1:  DEPRESSION (ICD-311) Will remove zoloft and just try counseling. The following medications were removed from the medication list:    Zoloft 50 Mg Tabs (Sertraline hcl) .Marland Kitchen... 1/2 tab by mouth daily for 1 week, then 1 tab by mouth daily  Orders: Psychology Referral (Psychology)  Problem # 2:  DIABETES MELLITUS, TYPE II, UNCONTROLLED (ICD-250.02) Pt. needs to be on meds longer for adequate evaluation Her updated medication list for this problem includes:    Glucophage 500 Mg Tabs (Metformin hcl) .Marland Kitchen... 2 tablets by mouth two times a day    Lisinopril 20 Mg Tabs (Lisinopril) .Marland Kitchen... 1 tab by mouth daily    Glucotrol Xl 5 Mg  Xr24h-tab (Glipizide) .Marland Kitchen... 1 tab by mouth q am  Problem # 3:  HYPERTENSION, BENIGN ESSENTIAL (ICD-401.1) Needs to be on meds longer to see if controlled on current meds. Her updated medication list for this problem includes:    Atenolol 50 Mg Tabs (Atenolol) .Marland Kitchen... Take one (1) tablet each day    Norvasc 10 Mg Tabs (Amlodipine besylate) .Marland Kitchen... Take one (1) tablet by mouth every day    Lisinopril 20 Mg Tabs (Lisinopril) .Marland Kitchen... 1 tab by mouth daily    Hydrochlorothiazide 25 Mg Tabs (Hydrochlorothiazide) .Marland Kitchen... 1 tablet by mouth daily  Complete Medication List: 1)  Atenolol 50 Mg Tabs (Atenolol) .... Take one (1) tablet each day 2)  Norvasc 10 Mg Tabs (Amlodipine besylate) .... Take one (1) tablet by mouth every day 3)  Glucophage 500 Mg Tabs (Metformin hcl) .... 2 tablets by mouth two times a day 4)  Glucometer Elite Classic Kit (Blood glucose monitoring suppl) .... Use to check sugars two times a day 5)  Glucometer Elite Test Strp (Glucose blood) .... Two times a day sugar testing 6)  Lisinopril 20 Mg Tabs (Lisinopril) .Marland Kitchen.. 1 tab by mouth daily 7)  Crestor 10 Mg Tabs (Rosuvastatin calcium) .Marland Kitchen.. 1 tab by mouth daily 8)  Lovaza 1 Gm Caps (Omega-3-acid ethyl esters) .... 4 caps by mouth daily 9)  Glucotrol Xl 5 Mg Xr24h-tab (Glipizide) .Marland Kitchen.. 1 tab by mouth q am 10)  Hydrochlorothiazide 25 Mg Tabs (Hydrochlorothiazide) .Marland Kitchen.. 1 tablet by mouth daily 11)  Dish Pessary Misc (Misc. devices) 12)  Miralax Powd (Polyethylene glycol 3350) .... As per prep  instructions. 13)  Dulcolax 5 Mg Tbec (Bisacodyl) .... Day before procedure take 2 at 3pm and 2 at 8pm. 14)  Reglan 10 Mg Tabs (Metoclopramide hcl) .... As per prep instructions.  Patient Instructions: 1)  Referral [facility] for counseling. 2)  Follow up with Dr. Delrae Alfred in 3 months --DM, htn, depression 3)  Call office and let us know after you have sleep study done, so we can look for results

## 2010-02-19 NOTE — Progress Notes (Signed)
  Phone Note Call from Patient Call back at [facility] Phone [phone]   Summary of Call: The pt received a new prescription called hydrochlorothiadide and she only got 15 pills but she is not sure if she should take more pills or not.  Pt needs to come next Friday, March 18 but she only has three more pills and won't last till next Friday.  Please call her back. [facility] Delrae Alfred MD  Initial call taken by: Manon Hilding,  March 30, 2009 3:22 PM  Follow-up for Phone Call        Pt was increased to one pill a day, but only given #15 by pharmacy, called [facility] to let them know of the increase in dosage. Follow-up by: Vesta Mixer CMA,  March 30, 2009 3:53 PM

## 2010-02-19 NOTE — Procedures (Signed)
Summary: Colonoscopy  Patient: Holly Acosta Note: All result statuses are Final unless otherwise noted.  Tests: (1) Colonoscopy (COL)   COL Colonoscopy           DONE     [facility]     [address]     [place], [place]  [address]           COLONOSCOPY PROCEDURE REPORT           PATIENT:  [name], [name]  MR#:  [id number]     BIRTHDATE:  1955/03/29, 54 yrs. old  GENDER:  female     ENDOSCOPIST:  Hedwig Morton. Juanda Chance, MD     REF. BY:  Julieanne Manson, M.D.     PROCEDURE DATE:  09/07/2009     PROCEDURE:  Colonoscopy 24401     ASA CLASS:  Class I     INDICATIONS:  Routine Risk Screening     MEDICATIONS:   Versed 7 mg, Fentanyl 50 mcg           DESCRIPTION OF PROCEDURE:   After the risks benefits and     alternatives of the procedure were thoroughly explained, informed     consent was obtained.  Digital rectal exam was performed and     revealed no rectal masses.   The LB [id number] endoscope     was introduced through the anus and advanced to the cecum, which     was identified by both the appendix and ileocecal valve, without     limitations.  The quality of the prep was good, using MiraLax.     The instrument was then slowly withdrawn as the colon was fully     examined.     <<PROCEDUREIMAGES>>           FINDINGS:  Four polyps were found in the sigmoid to descending     colon segments. 4 mm polyp at 60 cm, 10 mm sessile polyp at 15     cm,3 mm sessile polyp at 15 cm and 2 mm flat polyp at 10 cm(     removed but no tissue) Polyps were snared without cautery.     Retrieval was successful. snare polyp Polyp was snared, then     cauterized with monopolar cautery. Retrieval was successful (see     image6, image5, image7, image9, image8, and image10). snare polyp     This was otherwise a normal examination of the colon (see image11,     image4, image3, image2, and image1).   Retroflexed views in the     rectum revealed no abnormalities.    The scope was then withdrawn     from the patient and the procedure completed.           COMPLICATIONS:  None     ENDOSCOPIC IMPRESSION:     1) Four polyps in the sigmoid to descending colon segments     2) Otherwise normal examination     RECOMMENDATIONS:     1) Await pathology results     2) High fiber diet.     REPEAT EXAM:  In 5 year(s) for.           ______________________________     Hedwig Morton. Juanda Chance, MD           CC:           n.     eSIGNED:   Hedwig Morton. Brodie at 09/07/2009 09:17 AM  Shauna, Bodkins, [id number]  Note: An exclamation mark (!) indicates a result that was not dispersed into the flowsheet. Document Creation Date: 09/07/2009 9:19 AM _______________________________________________________________________  (1) Order result status: Final Collection or observation date-time: 09/07/2009 09:10 Requested date-time:  Receipt date-time:  Reported date-time:  Referring Physician:   Ordering Physician: Lina Sar [phone]) Specimen Source:  Source: Launa Grill Order Number: [id number] Lab site:   Appended Document: Colonoscopy     Procedures Next Due Date:    Colonoscopy: 09/2019

## 2010-02-19 NOTE — Letter (Signed)
Summary: *HSN Results Follow up  [facility]  [address] [place], [place] [address]   Phone: [phone]  Fax: [phone]      08/18/2009   [facility] [address] [place], [place]  [address]   Dear  Ms. Benna Ferrante,                            ____S.Drinkard,FNP   ____D. Gore,FNP       ____B. McPherson,MD   ____V. Rankins,MD    __X__E. Jermine Bibbee,MD    ____N. Daphine Deutscher, FNP  ____D. Reche Dixon, MD    ____K. Philipp Deputy, MD    ____Other     This letter is to inform you that your recent test(s):  _______Pap Smear    _______Lab Test     ___X____X-ray    _______ is within acceptable limits  _______ requires a medication change  _______ requires a follow-up lab visit  _______ requires a follow-up visit with your provider   Comments:  Thyroid ultrasound showed 2 small growths that are not concerning appearing.  Recommend recheck of your thyroid this time next year and considering a repeat ultrasound if feels larger.       _________________________________________________________ If you have any questions, please contact our office                     Sincerely,  Julieanne Manson MD [facility]

## 2010-02-19 NOTE — Assessment & Plan Note (Signed)
Summary: 54mo f/u/////rjp   Vital Signs:  Patient profile:   55 year old female Weight:      200.2 pounds Temp:     97.9 degrees F oral Pulse rate:   54 / minute Pulse rhythm:   regular Resp:     18 per minute BP sitting:   159 / 85  (left arm) Cuff size:   large  Vitals Entered By: Geanie Cooley  (January 26, 2009 12:26 PM) CC: pt here for followup, pt wants to know if there is something she can take for  the contstant pain in her knees. pt has arthiritis in her knees, the pain is worse when she walks. Is Patient Diabetic? Yes Did you bring your meter with you today? No Pain Assessment Patient in pain? yes     Location: knee Intensity: 8 Type: aching CBG Result 96  Does patient need assistance? Functional Status Self care Ambulation Normal   Primary Care Provider:  Nehemie Casserly  CC:  pt here for followup, pt wants to know if there is something she can take for  the contstant pain in her knees. pt has arthiritis in her knees, and the pain is worse when she walks.Marland Kitchen  History of Present Illness: 1.  Hyperlipidemia:  Just back to exercising after the holidays.  Using the exercise bike as limited with walking with knee pain.  No current access to a pool.  Does not feel really comfortable swimming, but would consider some water aerobics or walking in level not above head.  2.  DM:  Retasure- thinks she had done some time in past year--unable to find documentation.  Checks feet nightly.  Just received flu shot today.  Up to date on pneumovax.  Sugars 80-90s in morning.  Has not been checking later in day.  A1C in upper 8s in June.  3.  Hypertension:  May have been missing meds when tries to refill and cannot get right away.  Taking max of Norvasc, 10 mg Lisinopril, 50 mg of Atenolol.  Exercise as above.  4.  Bilateral knee pain:  Has hx of OA of bilateral knees.  Would like to try Glucosamine/chondroitin.  Discussed weight issue and exercise above as well.  Habits &  Providers  Alcohol-Tobacco-Diet     Tobacco Status: quit--5 years ago     Cigarette Packs/Day: 1.0     Year Started: age 28-until late 61s  Allergies (verified): No Known Drug Allergies  Social History: Smoking Status:  quit--5 years ago Packs/Day:  1.0  Review of Systems Resp:  Denies cough and shortness of breath. GI:  No abdominal pain with hernia.  Physical Exam  General:  Obese, NAD Lungs:  Bilateral dry crackles at bases posteriorly Heart:  RRR, bradycardic with Grade I/VI SEM along LSB.  Normodynamic radial pulses.  No JVD Abdomen:  Supraumbilical hernia that is nontender and fairly soft but unable to completely reduce.soft, non-tender, no rebound tenderness, no hepatomegaly, and no splenomegaly.  Morbidly obese.   Impression & Recommendations:  Problem # 1:  CRACKLES BILATERAL LUNG BASES (ICD-793.1) No evidence of failure Orders: Diagnostic X-Ray/Fluoroscopy (Diagnostic X-Ray/Flu)  Problem # 2:  DIABETES MELLITUS, TYPE II, UNCONTROLLED (ICD-250.02) encouraged lifestyle changes Diabetic foot exam today flu shot today Her updated medication list for this problem includes:    Glucophage 500 Mg Tabs (Metformin hcl) .Marland Kitchen... 2 tablets by mouth two times a day    Lisinopril 20 Mg Tabs (Lisinopril) .Marland Kitchen... 1 tab by mouth daily  Glucotrol Xl 5 Mg Xr24h-tab (Glipizide) .Marland Kitchen... 1 tab by mouth q am  Orders: T- Hemoglobin A1C (81191-47829) T-Urine Microalbumin w/creat. ratio [phone])  Problem # 3:  HYPERCHOLESTEROLEMIA (ICD-272.0) At goal save for HDL with combination Her updated medication list for this problem includes:    Crestor 10 Mg Tabs (Rosuvastatin calcium) .Marland Kitchen... 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 Pt. not light  headed or fatigued with resting pulse, but limited with beta blockade secondary to this Increase Lisinopril to 20 mg daily Her updated medication list for this problem includes:    Atenolol 50 Mg Tabs (Atenolol) .Marland Kitchen... Take one (1) tablet each day    Norvasc 10 Mg Tabs (Amlodipine besylate) .Marland Kitchen... Take one (1) tablet by mouth every day    Lisinopril 20 Mg Tabs (Lisinopril) .Marland Kitchen... 1 tab by mouth daily  Problem # 6:  VENTRAL HERNIA (ICD-553.20) Follow for now  Complete Medication List: 1)  Atenolol 50 Mg Tabs (Atenolol) .... Take one (1) tablet each day 2)  Norvasc 10 Mg Tabs (Amlodipine besylate) .... Take one (1) tablet by mouth every day 3)  Glucophage 500 Mg Tabs (Metformin hcl) .... 2 tablets by mouth two times a day 4)  Glucometer Elite Classic Kit (Blood glucose monitoring suppl) .... Use to check sugars two times a day 5)  Glucometer Elite Test Strp (Glucose blood) .... Two times a day sugar testing 6)  Lisinopril 20 Mg Tabs (Lisinopril) .Marland Kitchen.. 1 tab by mouth daily 7)  Crestor 10 Mg Tabs (Rosuvastatin calcium) .Marland Kitchen.. 1 tab by mouth daily 8)  Ibuprofen 600 Mg Tabs (Ibuprofen) .Marland Kitchen.. 1 tab by mouth q 6h with food as needed pain 9)  Lovaza 1 Gm Caps (Omega-3-acid ethyl esters) .... 4 caps by mouth daily 10)  Glucotrol Xl 5 Mg Xr24h-tab (Glipizide) .Marland Kitchen.. 1 tab by mouth q am  Other Orders: Capillary Blood Glucose/CBG (62952) Flu Vaccine 57yrs + (84132) Admin 1st Vaccine (44010) Admin 1st Vaccine [facility]) [phone])  Patient Instructions: 1)  BP check with BMET   V58.69 in 2-3 weeks--nurse visit 2)  Follow up with Dr. Delrae Alfred in 6 weeks--bp 3)  Get into a pool if possible Prescriptions: CRESTOR 10 MG  TABS (ROSUVASTATIN CALCIUM) 1 tab by mouth daily  #30 x 6   Entered and Authorized by:   Julieanne Manson MD   Signed by:   Julieanne Manson MD on 01/26/2009   Method used:   Faxed to ...       [facility] (retail)       [address] [place], [place]  [address]       Ph: [phone]       Fax: [phone]   RxID:   [id number] GLUCOMETER ELITE TEST   STRP (GLUCOSE BLOOD) two times a day sugar testing  #100 x 11   Entered and Authorized by:   Julieanne Manson MD   Signed by:   Julieanne Manson MD on 01/26/2009   Method used:   Faxed to ...       [facility] (retail)       [address] [place], [place]  [address]  Ph: [phone]       Fax: [phone]   RxID:   [id number] LISINOPRIL 20 MG TABS (LISINOPRIL) 1 tab by mouth daily  #30 x 11   Entered and Authorized by:   Julieanne Manson MD   Signed by:   Julieanne Manson MD on 01/26/2009   Method used:   Faxed to ...       [facility] (retail)       [address] [place], [place]  [address]       Ph: [phone]       Fax: [phone]   RxID:   [id number]    Vital Signs:  Patient profile:   55 year old female Weight:      200.2 pounds Temp:     97.9 degrees F oral Pulse rate:   54 / minute Pulse rhythm:   regular Resp:     18 per minute BP sitting:   159 / 85  (left arm) Cuff size:   large  Vitals Entered By: Geanie Cooley  (January 26, 2009 12:26 PM)    Influenza Vaccine    Vaccine Type: Fluvax 3+    Site: left deltoid    Mfr: Sanofi Pasteur    Dose: 0.5 ml    Route: IM    Given by: Vesta Mixer CMA    Exp. Date: 07/12/2009    Lot #: [id number]    VIS given: 08/13/06 version given January 26, 2009.  Flu Vaccine Consent Questions    Do you have a history of severe allergic reactions to this vaccine? no    Any prior history of allergic reactions to egg and/or gelatin? no    Do you have a sensitivity to the preservative Thimersol? no    Do you have a past history of Guillan-Barre Syndrome? no    Do you currently have an acute febrile illness? no    Have you ever had a severe reaction to latex? no    Vaccine information  given and explained to patient? yes    Are you currently pregnant? no

## 2010-02-19 NOTE — Miscellaneous (Signed)
Summary: Referral to [facility] for CPAP set up  Clinical Lists Changes  Problems: Changed problem from Question of  SLEEP APNEA (ICD-780.57) to SLEEP APNEA (ICD-780.57) - CPAP titration 10/11/09  Settings;  13 CWP Small ResMed Mirage Quattro mask with heated humidifier. - Signed Orders: Added new Referral order of Home Health Referral [facility]) - Signed  Nora--needs referral to [facility] for CPAP set up at home. I send the referral today to [facility] and also the application to [facility] to the patient .Marland KitchenCheryll Dessert  November 26, 2009 9:46 AM

## 2010-02-19 NOTE — Letter (Signed)
Summary: [facility] Instructions  [facility]  [address] [place], [place] [address]   Phone: [phone]  Fax: [phone]       BRAELEE HERRLE    [birth year]    MRN: [id number]       Procedure Day /Date:  Friday 09/07/2009     Arrival Time:  7:30 am     Procedure Time: 8:30 am     Location of Procedure:                    _x _  [facility] (4th Floor)    PREPARATION FOR COLONOSCOPY WITH MIRALAX  Starting 5 days prior to your procedure Sunday 8/14  do not eat nuts, seeds, popcorn, corn, beans, peas,  salads, or any raw vegetables.  Do not take any fiber supplements (e.g. Metamucil, Citrucel, and Benefiber). ____________________________________________________________________________________________________   THE DAY BEFORE YOUR PROCEDURE         DATE: Thursday 8/18  1   Drink clear liquids the entire day-NO SOLID FOOD  2   Do not drink anything colored red or purple.  Avoid juices with pulp.  No orange juice.  3   Drink at least 64 oz. (8 glasses) of fluid/clear liquids during the day to prevent dehydration and help the prep work efficiently.  CLEAR LIQUIDS INCLUDE: Water Jello Ice Popsicles Tea (sugar ok, no milk/cream) Powdered fruit flavored drinks Coffee (sugar ok, no milk/cream) Gatorade Juice: apple, white grape, white cranberry  Lemonade Clear bullion, consomm, broth Carbonated beverages (any kind) Strained chicken noodle soup Hard Candy  4   Mix the entire bottle of Miralax with 64 oz. of Gatorade/Powerade in the morning and put in the refrigerator to chill.  5   At 3:00 pm take 2 Dulcolax/Bisacodyl tablets.  6   At 4:30 pm take one Reglan/Metoclopramide tablet.  7  Starting at 5:00 pm drink one 8 oz glass of the Miralax mixture every 15-20 minutes until you have finished drinking the entire 64 oz.  You should finish drinking prep around 7:30 or 8:00 pm.  8   If you are nauseated, you may take the 2nd Reglan/Metoclopramide  tablet at 6:30 pm.        9    At 8:00 pm take 2 more DULCOLAX/Bisacodyl tablets.     THE DAY OF YOUR PROCEDURE      DATE:  Friday 8/19  You may drink clear liquids until 6:30 am   (2 HOURS BEFORE PROCEDURE).   MEDICATION INSTRUCTIONS  Unless otherwise instructed, you should take regular prescription medications with a small sip of water as early as possible the morning of your procedure.  Diabetic patients - see separate instructions.  Additional medication instructions:  Do not take HCTZ day of procedure.         OTHER INSTRUCTIONS  You will need a responsible adult at least 55 years of age to accompany you and drive you home.   This person must remain in the waiting room during your procedure.  Wear loose fitting clothing that is easily removed.  Leave jewelry and other valuables at home.  However, you may wish to bring a book to read or an iPod/MP3 player to listen to music as you wait for your procedure to start.  Remove all body piercing jewelry and leave at home.  Total time from sign-in until discharge is approximately 2-3 hours.  You should go home directly after your procedure and rest.  You can resume  normal activities the day after your procedure.  The day of your procedure you should not:   Drive   Make legal decisions   Operate machinery   Drink alcohol   Return to work  You will receive specific instructions about eating, activities and medications before you leave.   The above instructions have been reviewed and explained to me by   Ezra Sites RN  August 31, 2009 11:18 AM    I fully understand and can verbalize these instructions _____________________________ Date _______

## 2010-02-19 NOTE — Letter (Signed)
Summary: TEST ORDER FORM//ULTRASOUND/APPT DATE & TIME  TEST ORDER FORM//ULTRASOUND/APPT DATE & TIME   Imported By: Arta Bruce 08/10/2009 12:31:54  _____________________________________________________________________  External Attachment:    Type:   Image     Comment:   External Document

## 2010-02-19 NOTE — Letter (Signed)
Summary: SLEEP STUDY APPT  SLEEP STUDY APPT   Imported By: Arta Bruce 08/14/2009 10:40:10  _____________________________________________________________________  External Attachment:    Type:   Image     Comment:   External Document

## 2010-02-19 NOTE — Progress Notes (Signed)
Summary: SLEEP STUDY REFERRAL   Phone Note Outgoing Call   Summary of Call: Holly Acosta--referral for screening colonoscopy and for split night sleep study--see orders.  Send CPP for GI referral Initial call taken by: Julieanne Manson MD,  August 10, 2009 12:07 PM  Follow-up for Phone Call        I SEND REFERRAL TODAY  TO [facility] SLEEP STUDY  PT HAVE AN APPT 09-21-09 2 @ 8PM  Follow-up by: Cheryll Dessert,  August 13, 2009 3:14 PM

## 2010-02-19 NOTE — Assessment & Plan Note (Signed)
Summary: CPP/////KT//RES//SS   Vital Signs:  Patient profile:   55 year old female Weight:      188 pounds BMI:     31.89 Temp:     97.9 degrees F Pulse rate:   56 / minute Pulse rhythm:   regular Resp:     18 per minute BP sitting:   175 / 91  (left arm) Cuff size:   large  Vitals Entered By: Vesta Mixer CMA (August 10, 2009 10:49 AM) CC: CPP Is Patient Diabetic? Yes CBG Result 97  Does patient need assistance? Ambulation Normal   Primary Care Provider:  Adalea Handler  CC:  CPP.  History of Present Illness: 55 yo female here for CPP.  Pt. has been off  all meds for about 1 month as she did not get eligibility appt. in timely fashion and could not get meds filled.    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 1.  2008:  Cardiac Catheterization: normal  Family History: Mother, died 39:   DM, Alzheimer's dementia, Hypertension, arthritis Father, died 68:  MI. 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 Tobacco Use:  Started age 17, quit age 49:  smoked 1ppd Alcohol Use:  None Drug Use:  None  Review of Systems General:  Energy not good.. Eyes:  Reading glasses. No eye exam in last year.  Had Retasure over 1 year ago.. ENT:  Denies decreased hearing. CV:   Denies chest pain or discomfort and palpitations. Resp:  Denies shortness of breath. GI:  Denies abdominal pain, bloody stools, dark tarry stools, and diarrhea; occasional constipation. GU:  Denies discharge, dysuria, and urinary frequency. MS:  Significant knee pain--OA. Derm:  Rash--red and itchy on lateral low legs intermittently.  Uses Jergen's Lotion and helps.  Legs do not swell.. Neuro:  Sometimes with numbness and tingling in right hand--all fingers--does not note that it starts on one side or the other.Marland Kitchen Psych:  Has not completed PHQ 9, but several answers in 2 and 3 range--ultimately scored 14--has felt depressed for a few months.  Not suicidal.  Has been to counseling at [facility] and knows she can go back.  Does not feel that there is anything more she can talk about.  Main issues she feels that are affecting her are in dealing with children, especially one daughter who has struggled with drug addiction.  Mom thinks she is still using.  She watches her daughter's children as at times her daughter is not able.  Sleeps well, but never refreshed.  Does not really look forward to day.  Never knows what the day may bring with regards to her daughter.  Does not really find herself tearful a lot.   Does snore very badly--sometimes apneic.  Has never been evaluated for sleep apnea.  Physical Exam  General:  overweight, NAD Head:  Normocephalic and atraumatic without obvious abnormalities. No apparent alopecia or balding. Eyes:  No corneal or conjunctival inflammation noted.  EOMI. Perrla. Funduscopic exam benign, without hemorrhages, exudates or papilledema. Vision grossly normal. Ears:  External ear exam shows no significant lesions or deformities.  Otoscopic examination reveals clear canals, tympanic membranes are intact bilaterally without bulging, retraction, inflammation or discharge. Hearing is grossly normal bilaterally. Nose:  External nasal examination shows no deformity or  inflammation. Nasal mucosa are pink and moist without lesions or exudates. Mouth:  Oral mucosa and oropharynx without lesions or exudates.  Teeth in good repair.fair dentition.   Neck:  No deformities, masses, or tenderness noted.  Thyromegaly noted--seems to go down into low neck area, not just wide  No definite nodule Breasts:  No mass, nodules, thickening, tenderness, bulging, retraction, inflamation, nipple discharge or skin changes noted.   Lungs:  Normal respiratory effort, chest expands symmetrically. Lungs are clear to auscultation, no wheezes, but faint dry crackles at bases.  Resonant to percussion at bases. Heart:  Normal rate and regular rhythm. S1 and S2 normal without gallop, murmur, click, rub or other extra sounds. Abdomen:  Bowel sounds positive,abdomen soft and non-tender without masses or  organomegaly.  Does have a large  chronic supraumbilical hernia that does not completely reduce.  Hernia area nontender.   Rectal:  No external abnormalities noted. Normal sphincter tone. No rectal masses or tenderness.  Heme negative light brown stool Genitalia:  Pelvic Exam: After pessary removal        External: normal female genitalia without lesions or masses        Vagina: normal without lesions or masses        Cervix: Able to palpate, but not able to fully visualize secondary to very lax vaginal walls blocking view.        Adnexa: normal bimanual exam without masses or fullness        Uterus: normal by palpation        Pap smear: performed Msk:  No deformity or scoliosis noted of thoracic or lumbar spine.   Pulses:  R and L carotid,radial,femoral,dorsalis pedis and posterior tibial pulses are full and equal bilaterally Extremities:  No clubbing, cyanosis, edema.Hypertrophic changes of bilateral knees with somewhat limited flexion. Neurologic:  No cranial nerve deficits noted. Station and gait are normal. Plantar reflexes are down-going bilaterally. DTRs are symmetrical throughout.  Sensory, motor and coordinative functions appear intact. Skin:  Hyperpigmenatation of lower legs bilaterally with fine scabbing on lateral pretib area bilaterally.  Appears dry Cervical Nodes:  No lymphadenopathy noted Axillary Nodes:  No palpable lymphadenopathy Inguinal Nodes:  No significant adenopathy Psych:  Cognition and judgment appear intact. Alert and cooperative with normal attention span and concentration. No apparent delusions, illusions, hallucinations   Impression & Recommendations:  Problem # 1:  ROUTINE GYNECOLOGICAL EXAMINATION (ICD-V72.31)  Mammogram scheduled  Orders: Pap Smear, Thin Prep ( Collection of) [id number]) KOH/ WET Mount [phone]) T- GC Chlamydia ([address]) T-HIV Antibody  (Reflex) ([phone]) T-Pap Smear, Thin Prep (78469) T-Syphilis Test (RPR) ([id number])  Problem # 2:  HEALTH SCREENING (ICD-V70.0) Immunizations up to date Guiac cards x 3 to return in 2 weeks. Orders: Gastroenterology Referral (GI)  Problem # 3:  DEPRESSION (ICD-311)  Her updated medication list for this problem includes:    Zoloft 50 Mg Tabs (Sertraline hcl) .Marland Kitchen... 1/2 tab by mouth daily for 1 week, then 1 tab by mouth daily  Problem # 4:  Health issues Regarding cholesterol, DM, htn--none of these are controlled currently as has not filled meds --no labs until back on meds for a bit.  Problem #  5:  THYROMEGALY (ICD-240.9)  Orders: T-TSH ([id number]) Ultrasound (Ultrasound)  Complete Medication List: 1)  Atenolol 50 Mg Tabs (Atenolol) .... Take one (1) tablet each day 2)  Norvasc 10 Mg Tabs (Amlodipine besylate) .... Take one (1) tablet by mouth every day 3)  Glucophage 500 Mg Tabs (Metformin hcl) .... 2 tablets by mouth two times a day 4)  Glucometer Elite Classic Kit (Blood glucose monitoring suppl) .... Use to check sugars two times a day 5)  Glucometer Elite Test Strp (Glucose blood) .... Two times a day sugar testing 6)  Lisinopril 20 Mg Tabs (Lisinopril) .Marland Kitchen.. 1 tab by  mouth daily 7)  Crestor 10 Mg Tabs (Rosuvastatin calcium) .Marland Kitchen.. 1 tab by mouth daily 8)  Lovaza 1 Gm Caps (Omega-3-acid ethyl esters) .... 4 caps by mouth daily 9)  Glucotrol Xl 5 Mg Xr24h-tab (Glipizide) .Marland Kitchen.. 1 tab by mouth q am 10)  Hydrochlorothiazide 25 Mg Tabs (Hydrochlorothiazide) .Marland Kitchen.. 1 tablet by mouth daily 11)  Zoloft 50 Mg Tabs (Sertraline hcl) .... 1/2 tab by mouth daily for 1 week, then 1 tab by mouth daily 12)  Dish Pessary Misc (Misc. devices)  Other Orders: Capillary Blood Glucose/CBG (65784) Hemoglobin A1C (83036) UA Dipstick w/o Micro (manual) (69629) T-Urine Microalbumin w/creat. ratio [phone]) Sleep Disorder Referral (Sleep Disorder)  Patient Instructions: 1)  Schedule Retasure 2)  Call for flu shot end of October, beginning of NOvember 3)  Follow up with Dr. Delrae Alfred in 1 month--dysthymia 4)  Will schedule fasting labs after 1 month follow up:  FLP, CBC  Preventive Care Screening  Prior Values:    Pap Smear:  NEGATIVE FOR INTRAEPITHELIAL LESIONS OR MALIGNANCY. (07/07/2008)    Mammogram:  ASSESSMENT: Negative - BI-RADS 1^MM DIGITAL SCREENING (07/21/2008)    Last Tetanus Booster:  Tdap (08/19/2007)    Last Flu Shot:  Fluvax 3+ (01/26/2009)    Last Pneumovax:  Pneumovax (08/19/2007)     SBE:  Yes and no changes Osteoprevention:  Not much in way of dairy.  Exercising on Stationary bike--15 minutes two times a day  Guaiac Cards:  cannot recall. Colonoscopy:  never.  Prescriptions: HYDROCHLOROTHIAZIDE 25 MG TABS (HYDROCHLOROTHIAZIDE) 1 tablet by mouth daily  #30 x 11   Entered and Authorized by:   Julieanne Manson MD   Signed by:   Julieanne Manson MD on 08/10/2009   Method used:   Faxed to ...       [facility] (retail)       [address] [place], [place]  [address]       Ph: [phone]       Fax: [phone]   RxID:   [id number] GLUCOTROL XL 5 MG XR24H-TAB (GLIPIZIDE) 1 tab by mouth q am   #30 x 11   Entered and Authorized by:   Julieanne Manson MD   Signed by:   Julieanne Manson MD on 08/10/2009   Method used:   Faxed to ...       [facility] - [facility] (retail)       [address] [place], [place]  [address]       Ph: [phone]       Fax: [phone]   RxID:   [id number] LOVAZA 1 GM CAPS (OMEGA-3-ACID ETHYL ESTERS) 4 caps by mouth daily  #120 x 11   Entered and Authorized by:   Julieanne Manson MD   Signed by:  Julieanne Manson MD on 08/10/2009   Method used:   Faxed to ...       [facility] - [facility] (retail)       [address] [place], [place]  [address]       Ph: [phone]       Fax: [phone]   RxID:   [id number] CRESTOR 10 MG  TABS (ROSUVASTATIN CALCIUM) 1 tab by mouth daily  #30 x 6   Entered and Authorized by:   Julieanne Manson MD   Signed by:   Julieanne Manson MD on 08/10/2009   Method used:   Faxed to ...       [facility] - [facility] (retail)       [address] [place], [place]  [address]       Ph: [phone]       Fax: [phone]   RxID:   [id number] LISINOPRIL 20 MG TABS (LISINOPRIL) 1 tab by mouth daily  #30 x 11   Entered and Authorized by:   Julieanne Manson MD   Signed by:   Julieanne Manson MD on 08/10/2009   Method used:   Faxed to ...       [facility] - Pharmac (retail)       [address] [place], [place]  [address]       Ph: [phone]       Fax: [phone]   RxID:   [id number] GLUCOMETER ELITE TEST   STRP (GLUCOSE BLOOD) two times a day sugar testing  #100 x 11   Entered and Authorized by:   Julieanne Manson MD   Signed by:   Julieanne Manson MD on 08/10/2009   Method used:   Faxed to ...       [facility] - Pharmac (retail)       [address] [place], [place]  [address]       Ph: [phone]        Fax: [phone]   RxID:   [id number] GLUCOPHAGE 500 MG  TABS (METFORMIN HCL) 2 tablets by mouth two times a day  #120 x 6   Entered and Authorized by:   Julieanne Manson MD   Signed by:   Julieanne Manson MD on 08/10/2009   Method used:   Faxed to ...       [facility] - Pharmac (retail)       [address] [place], [place]  [address]       Ph: [phone]       Fax: [phone]   RxID:   [id number] NORVASC 10 MG TABS (AMLODIPINE BESYLATE) TAKE ONE (1) TABLET BY MOUTH EVERY DAY  #30 x 11   Entered and Authorized by:   Julieanne Manson MD   Signed by:   Julieanne Manson MD on 08/10/2009   Method used:   Faxed to ...       [facility] - Pharmac (retail)       [address] [place], [place]  [address]       Ph: [phone]       Fax: [phone]   RxID:   [id number] ATENOLOL 50 MG TABS (ATENOLOL) TAKE ONE (1) TABLET EACH DAY  #30 x 11  Entered and Authorized by:   Julieanne Manson MD   Signed by:   Julieanne Manson MD on 08/10/2009   Method used:   Faxed to ...       [facility] - Pharmac (retail)       [address] [place], [place]  [address]       Ph: [phone]       Fax: [phone]   RxID:   [id number] ZOLOFT 50 MG TABS (SERTRALINE HCL) 1/2 tab by mouth daily for 1 week, then 1 tab by mouth daily  #30 x 2   Entered and Authorized by:   Julieanne Manson MD   Signed by:   Julieanne Manson MD on 08/10/2009   Method used:   Faxed to ...       [facility] - Pharmac (retail)       [address] [place], [place]  [address]       Ph: [phone]       Fax: [phone]   RxID:   [id number]   Laboratory Results   Urine Tests    Routine Urinalysis   Glucose: negative   (Normal Range: Negative) Bilirubin: negative   (Normal Range: Negative) Ketone: negative   (Normal Range:  Negative) Spec. Gravity: 1.010   (Normal Range: 1.003-1.035) Blood: negative   (Normal Range: Negative) pH: 6.5   (Normal Range: 5.0-8.0) Protein: trace   (Normal Range: Negative) Urobilinogen: negative   (Normal Range: 0-1) Nitrite: negative   (Normal Range: Negative) Leukocyte Esterace: negative   (Normal Range: Negative)     Blood Tests     HGBA1C: 7.7%   (Normal Range: Non-Diabetic - 3-6%   Control Diabetic - 6-8%) CBG Random:: 97    Wet Mount/KOH Source: vaginal WBC/hpf: 5-10 Bacteria/hpf: 2+ Clue cells/hpf: few  Positive whiff Yeast/hpf: none Trichomonas/hpf: none   Laboratory Results   Urine Tests    Routine Urinalysis   Glucose: negative   (Normal Range: Negative) Bilirubin: negative   (Normal Range: Negative) Ketone: negative   (Normal Range: Negative) Spec. Gravity: 1.010   (Normal Range: 1.003-1.035) Blood: negative   (Normal Range: Negative) pH: 6.5   (Normal Range: 5.0-8.0) Protein: trace   (Normal Range: Negative) Urobilinogen: negative   (Normal Range: 0-1) Nitrite: negative   (Normal Range: Negative) Leukocyte Esterace: negative   (Normal Range: Negative)     Blood Tests     HGBA1C: 7.7%   (Normal Range: Non-Diabetic - 3-6%   Control Diabetic - 6-8%) CBG Random:: 97mg /dL    Wet Mount  Positive whiff Wet Mount KOH: Negative     Appended Document: CPP/////KT//RES//SS Tiffany--did she get HIV testing?   Clinical Lists Changes  Observations: Added new observation of HIVRAPIDRSLT: negative (08/10/2009 17:46)      Laboratory Results    Other Tests  Rapid HIV: negative

## 2010-02-19 NOTE — Letter (Signed)
Summary: REFERAL//GASTROENTEROLOGY APPT DATE & TIME  REFERAL//GASTROENTEROLOGY APPT DATE & TIME   Imported By: Arta Bruce 08/15/2009 12:46:36  _____________________________________________________________________  External Attachment:    Type:   Image     Comment:   External Document

## 2010-02-19 NOTE — Letter (Signed)
Summary: *HSN Results Follow up  [facility]  [address] [place], [place] [address]   Phone: [phone]  Fax: [phone]      08/15/2009   [facility] [address] [place], [place]  [address]   Dear  Ms. Urania Dakin,                            ____S.Drinkard,FNP   ____D. Gore,FNP       ____B. McPherson,MD   ____V. Rankins,MD    _X___E. Emilia Kayes,MD    ____N. Daphine Deutscher, FNP  ____D. Reche Dixon, MD    ____K. Philipp Deputy, MD    ____Other     This letter is to inform you that your recent test(s):  ___X____Pap Smear    ___X____Lab Test     _______X-ray    ____X___ is within acceptable limits  _______ requires a medication change  _______ requires a follow-up lab visit  _______ requires a follow-up visit with your provider   Comments:       _________________________________________________________ If you have any questions, please contact our office                     Sincerely,  Julieanne Manson MD [facility]

## 2010-02-19 NOTE — Letter (Signed)
Summary: NUTRRITION & DIABETES MANAGEMENT  NUTRRITION & DIABETES MANAGEMENT   Imported By: Arta Bruce 06/12/2009 14:53:42  _____________________________________________________________________  External Attachment:    Type:   Image     Comment:   External Document

## 2010-02-19 NOTE — Letter (Signed)
Summary: Patient Notice- Polyp Results  [facility]  [address] [place], [place] [address]   Phone: [phone]  Fax: [phone]        September 10, 2009 MRN: [id number]    [facility] [address] [place], [place]  [address]    Dear Ms. Mayford Knife,  I am pleased to inform you that the colon polyp(s) removed during your recent colonoscopy was (were) found to be benign (no cancer detected) upon pathologic examination.All polyps were hyperplastic ( NOT precancerous)  I recommend you have a repeat colonoscopy examination in 10 _ years to look for recurrent polyps, as having colon polyps increases your risk for having recurrent polyps or even colon cancer in the future.  Should you develop new or worsening symptoms of abdominal pain, bowel habit changes or bleeding from the rectum or bowels, please schedule an evaluation with either your primary care physician or with me.  Additional information/recommendations:  _x_ No further action with gastroenterology is needed at this time. Please      follow-up with your primary care physician for your other healthcare      needs.  __ Please call [phone] to schedule a return visit to review your      situation.  __ Please keep your follow-up visit as already scheduled.  __ Continue treatment plan as outlined the day of your exam.  Please call us if you are having persistent problems or have questions about your condition that have not been fully answered at this time.  Sincerely,  Hart Carwin MD  This letter has been electronically signed by your physician.  Appended Document: Patient Notice- Polyp Results letter mailed

## 2010-02-21 NOTE — Letter (Signed)
Summary: FAXED MOST RECENT RECORDS TO [facility]  FAXED MOST RECENT RECORDS TO [facility]   Imported By: Arta Bruce 01/02/2010 11:48:54  _____________________________________________________________________  External Attachment:    Type:   Image     Comment:   External Document

## 2010-02-21 NOTE — Letter (Signed)
Summary: CRAP BI-LEVEL SET-UP INFORMATION  CRAP BI-LEVEL SET-UP INFORMATION   Imported By: Arta Bruce 01/07/2010 15:24:06  _____________________________________________________________________  External Attachment:    Type:   Image     Comment:   External Document

## 2010-02-21 NOTE — Assessment & Plan Note (Signed)
Summary: fup DM,HTN,depression/JM   Vital Signs:  Patient profile:   55 year old female Height:      64.50 inches Weight:      187.0 pounds BMI:     31.72 Temp:     97.3 degrees F oral Pulse rate:   72 / minute Pulse rhythm:   regular Resp:     18 per minute BP sitting:   140 / 92  (left arm) Cuff size:   regular  Vitals Entered By: [place] Shannon (January 18, 2010 3:09 PM) CC: pt is here for follow up..., Lipid Management Is Patient Diabetic? Yes Pain Assessment Patient in pain? no      CBG Result 114  Does patient need assistance? Functional Status Self care Ambulation Normal   Primary Care Provider:  [facility]  CC:  pt is here for follow up... and Lipid Management.  History of Present Illness: 1.  Sleep apnea:  Using CPAP nightly--generally between 4-8 hours.  Sometimes awakens and has taken off the apparatus.  Notes improved daytime energy and less sleepiness.    2.  Depression:  Went to [facility] once.  Missed another appt.  Has not yet made another appt.   3.  DM:  Has not had eyes checked with Retasure yet this year.   States fasting sugars are around 115.  Does not check any other time of day generally.  A1C at 7.5%, which is good for her.   Did get flu vaccine.  4.  Hypertension:  pt.'s med list shows Norvasc has not been filled since 07/2009, but pt. states she has been getting monthly.  Lisinopril and Atenolol appear filled appropriately.  Called pharmacy and they states she has not picked up Norvasc since July   Allergies: No Known Drug Allergies   Impression & Recommendations:  Problem # 1:  DEPRESSION (ICD-311) She is to let me know if she wants something else done for treatment. Does not seem interested currently Encouraged counseling at least  Problem # 2:  DIABETES MELLITUS, TYPE II, UNCONTROLLED (ICD-250.02) Fair control--certainly improved Her updated medication list for this problem includes:    Glucophage 500 Mg Tabs (Metformin  hcl) .Marland Kitchen... 2 tablets by mouth two times a day    Lisinopril 20 Mg Tabs (Lisinopril) .Marland Kitchen... 1 tab by mouth daily    Glucotrol Xl 5 Mg Xr24h-tab (Glipizide) .Marland Kitchen... 1 tab by mouth q am  Orders: Hgb A1C (14782NF)  Problem # 3:  HYPERTENSION, BENIGN ESSENTIAL (ICD-401.1) Not controlled, but not taking meds To get restarted on Norvasc and recheck bp when in for fasting labs Her updated medication list for this problem includes:    Atenolol 50 Mg Tabs (Atenolol) .Marland Kitchen... Take one (1) tablet each day    Norvasc 10 Mg Tabs (Amlodipine besylate) .Marland Kitchen... Take one (1) tablet by mouth every day    Lisinopril 20 Mg Tabs (Lisinopril) .Marland Kitchen... 1 tab by mouth daily    Hydrochlorothiazide 25 Mg Tabs (Hydrochlorothiazide) .Marland Kitchen... 1 tablet by mouth daily  Problem # 4:  SLEEP APNEA (ICD-780.57) Improved with CPAP  Complete Medication List: 1)  Atenolol 50 Mg Tabs (Atenolol) .... Take one (1) tablet each day 2)  Norvasc 10 Mg Tabs (Amlodipine besylate) .... Take one (1) tablet by mouth every day 3)  Glucophage 500 Mg Tabs (Metformin hcl) .... 2 tablets by mouth two times a day 4)  Glucometer Elite Classic Kit (Blood glucose monitoring suppl) .... Use to check sugars two times a day 5)  Glucometer Elite Test Strp (Glucose blood) .... Two times a day sugar testing 6)  Lisinopril 20 Mg Tabs (Lisinopril) .Marland Kitchen.. 1 tab by mouth daily 7)  Crestor 10 Mg Tabs (Rosuvastatin calcium) .Marland Kitchen.. 1 tab by mouth daily 8)  Lovaza 1 Gm Caps (Omega-3-acid ethyl esters) .... 4 caps by mouth daily 9)  Glucotrol Xl 5 Mg Xr24h-tab (Glipizide) .Marland Kitchen.. 1 tab by mouth q am 10)  Hydrochlorothiazide 25 Mg Tabs (Hydrochlorothiazide) .Marland Kitchen.. 1 tablet by mouth daily 11)  Dish Pessary Misc (Misc. devices)  Other Orders: Capillary Blood Glucose/CBG (40981)  Patient Instructions: 1)  Schedule fasting labs  and bp check with nurse in next 2 weeks:  FLP, CMET, CBC. 2)  Follow up with Dr. Delrae Alfred in 4 months --htn, DM 3)  Schedule  Retasure Prescriptions: NORVASC 10 MG TABS (AMLODIPINE BESYLATE) TAKE ONE (1) TABLET BY MOUTH EVERY DAY  #30 x 11   Entered and Authorized by:   Julieanne Manson MD   Signed by:   Julieanne Manson MD on 01/18/2010   Method used:   Faxed to ...       [facility] - [facility] (retail)       [address] [place], [place]  [address]       Ph: [phone]       Fax: [phone]   RxID:   [id number]    Orders Added: 1)  Capillary Blood Glucose/CBG [82948] 2)  Hgb A1C [83036QW] 3)  Est. Patient Level III [36[address]]    Laboratory Results   Blood Tests   Date/Time Received: January 18, 2010 3:45 PM   HGBA1C: 7.5%   (Normal Range: Non-Diabetic - 3-6%   Control Diabetic - 6-8%) CBG Random:: 114mg /dL

## 2010-03-02 ENCOUNTER — Telehealth (INDEPENDENT_AMBULATORY_CARE_PROVIDER_SITE_OTHER): Payer: Self-pay | Admitting: Internal Medicine

## 2010-03-19 NOTE — Progress Notes (Signed)
Summary: High cholesterol  Phone Note Outgoing Call   Summary of Call: Please call pt. --let her know it looks like she is not taking her Crestor.  She needs to take every day.  Rest of labs fine. Schedule for repeat FLP in 8 weeks please.  Also, send for medication fill report from pharmacy please--place in shelves where labs go please. Initial call taken by: Julieanne Manson MD,  March 02, 2010 3:21 PM  Follow-up for Phone Call        Called pt -- Notified her of the above info -- She says she has missed her Crestor but for the most part is taking them -- Encouraged pt. to take meds as instructed -- Sched. her an appt. for 05/03/10 @ 9am -- Called [facility] and they will be faxing over pt's medication list -- Will be placed in your shelf -- Hale Drone CMA  March 06, 2010 11:30 AM   Additional Follow-up for Phone Call Additional follow up Details #1::        Appears she is filling med appropriately when fill list evaluated, just needs to take it regularly Additional Follow-up by: Julieanne Manson MD,  March 13, 2010 8:36 AM

## 2010-04-04 LAB — GLUCOSE, CAPILLARY: Glucose-Capillary: 162 mg/dL — ABNORMAL HIGH (ref 70–99)

## 2010-04-05 LAB — GLUCOSE, CAPILLARY: Glucose-Capillary: 98 mg/dL (ref 70–99)

## 2010-06-07 NOTE — Cardiovascular Report (Signed)
NAMECAROLEANN, [name]                 ACCOUNT NO.:  [id number]   MEDICAL RECORD NO.:  [id number]          PATIENT TYPE:  OIB   LOCATION:  2899                         FACILITY:  [facility]   PHYSICIAN:  Peter M. Swaziland, M.D.  DATE OF BIRTH:  02-28-1955   DATE OF PROCEDURE:  05/14/2006  DATE OF DISCHARGE:                            CARDIAC CATHETERIZATION   INDICATION FOR PROCEDURE:  The patient is a 55 year old white female  with multiple cardiac risk factors, who presented with symptoms  predominantly of dyspnea.  She had an adenosine Cardiolite study, which  suggested a small area of ischemia in the posterolateral wall.   PROCEDURE:  Left heart catheterization, coronary and left ventricular  angiography.   ACCESS:  Via the right femoral artery using standard Seldinger  technique.   EQUIPMENT:  6-French 4 cm right and left Judkins catheter, 6-French  pigtail catheter, 6-French arterial sheath.  Closure was obtained with  Angio-Seal device with excellent hemostasis.   MEDICATIONS:  Local anesthesia 1% Xylocaine, Versed 2 mg IV.   CONTRAST:  90 mL of Omnipaque.   HEMODYNAMIC DATA:  Aortic pressure was 153/74 with a mean of 105.  Left  ventricular pressure is 149 with an EDP of 11 mmHg.   ANGIOGRAPHIC DATA:  The left coronary artery arises and distributes  normally.  The left main coronary is normal.   The left anterior descending artery and moderate diagonal branch are  both normal.   The left circumflex coronary artery is normal.   The right coronary is a large, dominant vessel and is normal.   Left ventricular angiography was performed in the RAO view.  This  demonstrates normal left ventricular size and contractility with normal  systolic function.  Ejection fraction is estimated at 60%.  There was no  mitral regurgitation or prolapse.   FINAL INTERPRETATION:  1. Normal coronary anatomy.  2. Normal left ventricular function.           ______________________________  Peter M. Swaziland, M.D.     PMJ/MEDQ  D:  05/14/2006  T:  05/14/2006  Job:  [id number]   cc:   Dr. Cathey Endow, Dala Dock

## 2010-06-07 NOTE — Discharge Summary (Signed)
Holly Acosta, FINCHER                 ACCOUNT NO.:  [id number]   MEDICAL RECORD NO.:  [id number]          PATIENT TYPE:  INP   LOCATION:  0165                         FACILITY:  [facility]   PHYSICIAN:  Hillery Aldo, M.D.   DATE OF BIRTH:  1955/04/25   DATE OF ADMISSION:  09/24/2004  DATE OF DISCHARGE:                                 DISCHARGE SUMMARY   PRIMARY CARE PHYSICIAN:  Dr. Donia Guiles at [facility].   DISCHARGE DIAGNOSES:  1.  Uterine prolapse.  2.  Acute renal failure secondary to obstructive uropathy.  3.  Sepsis with multiple organ dysfunction syndrome.  4.  Anemia.  5.  Chronic hypertension.  6.  Diarrhea, resolved.  7.  Hypokalemia.  8.  Noncardiac chest pain.  9.  Bilateral hydronephrosis.  10. Proteus mirabilis urinary tract infection.   DISCHARGE MEDICATIONS:  1.  Norvasc 5 mg daily.  2.  Atenolol 50 mg daily.  3.  Ciprofloxacin 250 mg b.i.d. x9 additional days.  4.  Iron supplements twice a day with meals.   Note:  The patient was instructed to stop her hydrochlorothiazide due to  renal insufficiency.   CONSULTATIONS:  1.  Dr. Vernie Ammons of urology.  2.  Dr. Vincente Poli of gynecology.   PROCEDURES AND DIAGNOSTIC STUDIES:  1.  Chest x-ray on September 25, 2004 showed no active cardiopulmonary      disease.  2.  Renal ultrasound on September 26, 2004 showed bilateral hydronephrosis.      Prior CT scan demonstrated the uterine prolapse with compression of the      bladder and distal ureters. The urologist commented that this uterine      and vaginal prolapse is the most likely cause for the bilateral      hydronephrosis.  3.  Chest x-ray on September 26, 2004 showed cardiomegaly with low lung      volumes. There was mild left basilar subsegmental atelectasis.   DISCHARGE LABORATORY VALUES:  CBC showed a white blood cell count of 12.2  and an absolute neutrophil count of 10.2. Hemoglobin was 10.1, hematocrit  30.3, MCV 82.1, and platelets 493. Sodium was 137,  potassium 3.5, chloride  104, bicarbonate 24, glucose 125, BUN 38, creatinine 2.0, calcium 8.7. Stool  for C. difficile toxin was negative. Stool cultures did not reveal any  suspicious growth. Fecal occult blood testing was negative. Urine cultures  grew greater than 100,000 colonies of Proteus mirabilis.   HOSPITAL COURSE BY PROBLEM:  #1 - SEPSIS. The patient did appear to be  septic on initial evaluation and she was empirically treated with IV Cipro  for presumed urinary source. She was hypotensive on admission as well. She  was found to be in acute renal failure. With appropriate antibiotic  treatment, her blood pressure stabilized and her white blood cell count  began to decline. It reached a high of 14.2. She will continue on a total  course of 2 weeks of ciprofloxacin therapy.   #2 - HYDRONEPHROSIS/OBSTRUCTIVE UROPATHY. The patient was found to have  gross uterine prolapse on physical exam. Urology and gynecology consultation  was obtained. Dr. Vernie Ammons replaced her uterine into the vagina to unkink the  uterus and treat the hydronephrosis and acute renal failure. She was  subsequently evaluated by Dr. Vincente Poli who implanted a pessary for a  temporizing measure. She will need close follow-up with a gynecologist after  discharge for endometrial biopsy and possible hysterectomy. Her renal  failure reversed with this intervention. Her creatinine had reached a high  of 4.1. At the time of this dictation, it has declined to 2.0. because of  her ongoing elevated creatinine, her hydrochlorothiazide will not be  resumed.   #3 - IRON DEFICIENCY ANEMIA. It was felt that her iron deficiency anemia was  likely secondary to fibroids in her uterus and menorrhagia. She did receive  a unit of packed red blood cells for a hemoglobin of 8.5. Iron studies were  done and her ferritin was 251 with an iron of less than 10. The elevation in  her ferritin likely was secondary to acute phase reaction from  her sepsis.  Her stools were heme negative. She will be continued on iron supplementation  therapy.   #4 - HYPOKALEMIA. The patient was supplemented with potassium and was able  to maintain her potassium level prior to discharge.   #5 - HYPERTENSION. The patient's antihypertensives were held while in-house  due to hypotension. Upon discharge, her blood pressure has normalized and  has even been high. Given this, we will resume her atenolol and her Norvasc.   DISPOSITION:  The patient will be discharged home today. She is to return to  Dr. Reche Dixon in 1-2 weeks' time. She is to return to a gynecologist in the  near future. She is to resume a low-salt diet and to resume activity slowly.   CONDITION AT DISCHARGE:  Improved.           ______________________________  Hillery Aldo, M.D.     CR/MEDQ  D:  09/30/2004  T:  09/30/2004  Job:  [id number]   cc:   Dineen Kid. Reche Dixon, M.D.  [address] [place]  [place] [address]  Fax: [phone]   Stann Mainland. Vincente Poli, M.D.  [address]  [place]  [place] [address]  Fax: [phone]   Veverly Fells. Vernie Ammons, M.D.  [address]  [place]  [place] [address]  Fax: [phone]

## 2010-06-07 NOTE — Discharge Summary (Signed)
NAMEWAYLYNN, BENEFIEL NO.:  [id number]   MEDICAL RECORD NO.:  [id number]           PATIENT TYPE:   LOCATION:                               FACILITY:  [facility]   PHYSICIAN:  Peter M. Swaziland, M.D.  DATE OF BIRTH:  04/05/1955   DATE OF ADMISSION:  05/14/2006  DATE OF DISCHARGE:                               DISCHARGE SUMMARY   Ms. Watrous is a 55 year old white female who was evaluated for symptoms  of dyspnea and chest pain.  She has multiple cardiac risk factors  including a history of tobacco abuse, hypertension, and  hypercholesterolemia.  She has a family history of early coronary  disease.  She presents with a history of dyspnea on exertion.  She also  has periods of chest tightness and discomfort that was really more  profound prior to a recent change in her blood pressure medication.  To  further evaluate her symptoms, she did undergo an adenosine Cardiolite  study on May 01, 2006.  This demonstrated a small area of reversible  ischemia in the inferior posterior wall.  Left ventricular function was  normal at 58%.  Based on these findings, I have recommended diagnostic  cardiac catheterization.   PAST MEDICAL HISTORY:  1. Hypertension.  2. Hypercholesterolemia.  3. Arthritis.  4. History of uterine prolapse.   She has had no prior surgery.   She has no known allergies.   CURRENT MEDICATIONS:  1. Atenolol 50 mg per day.  2. Pravachol 40 mg at bedtime.  3. Norvasc 10 mg per day  4. Tylenol p.r.n.   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  She is divorced.  She has three children.  She has a 15  pack-year history of smoking but quit in 2002.  She denies alcohol use.   FAMILY HISTORY:  Father died at age 55 with myocardial infarction.  Mother is age 59 and has diabetes.  One brother, age 61, has diabetes.  She has four sisters in good health.   REVIEW OF SYSTEMS:  Otherwise unremarkable.   PHYSICAL  EXAMINATION:  GENERAL:  The patient is an obese white female in  no distress.  VITAL SIGNS:  Weight is 202.  Blood pressure 118/70, pulse 60 and  regular, respirations were normal.  HEENT:  Unremarkable.  She has no JVD or bruits.  LUNGS:  Clear.  CARDIAC:  Regular rate and rhythm without gallop, murmur, rub or click.  ABDOMEN:  Soft, nontender, without masses or bruits.  VASCULAR:  Femoral and pedal pulses are 2+ and symmetric.  NEUROLOGIC:  Nonfocal.   LABORATORY DATA:  Resting ECG shows sinus bradycardia with LVH by  voltage and nonspecific T-wave abnormality.  Chest x-ray shows no active  disease.   IMPRESSION:  1. Symptoms of chest tightness and dyspnea on exertion consistent with      anginal equivalent.  Adenosine Cardiolite study shows a small area      of inferior posterior ischemia.  2. Hypercholesterolemia.  3. Hypertension.  4. Prior tobacco abuse.  5. Strong  family history of coronary disease.   PLAN:  We will proceed with diagnostic cardiac catheterization with  further therapy pending these results.           ______________________________  Peter M. Swaziland, M.D.     PMJ/MEDQ  D:  05/08/2006  T:  05/08/2006  Job:  [id number]

## 2010-06-07 NOTE — H&P (Signed)
NAMEKRIMSON, MASSMANN                 ACCOUNT NO.:  [id number]   MEDICAL RECORD NO.:  [id number]          PATIENT TYPE:  INP   LOCATION:  0104                         FACILITY:  [facility]   PHYSICIAN:  Lonia Blood, M.D.       DATE OF BIRTH:  [birth year]   DATE OF ADMISSION:  09/25/2004  DATE OF DISCHARGE:                                HISTORY & PHYSICAL   PRIMARY CARE PHYSICIAN:  Dineen Kid. Reche Dixon, M.D. at [facility].   CHIEF COMPLAINT:  Altered mental status.   HISTORY OF PRESENT ILLNESS:  Ms. Haefner is a 55 year old woman with a  history of hypertension who was brought in to the [facility]  around midnight for altered mental status.  The patient's sister reports  that the patient was found in her car, really confused and unable to get  down from the car because of just generalized weakness.  The patient does  not have a clear recollection of the event.  Currently, the patient denies  any coughing, denies any chest pain, denies abdominal pain.  She reports  some dysuria and some lower abdominal pain.   PAST MEDICAL HISTORY:  1.  Hypertension.  2.  Hyperlipidemia.  3.  Uterine prolapse.   HOME MEDICATIONS:  The patient is too confused to remember all the  medications, but she is taking a cholesterol pill, a beta blocker, and a  fluid pill, but she does not know the exact name or the dosages of them.   The patient has no known drug allergies.   SOCIAL HISTORY:  Ms. Matzek lives by herself.  She works as a Advertising copywriter.  She does not smoke cigarettes, does not drink alcohol, does not use any IV  drugs.  She is single and has two grownup children.   FAMILY HISTORY:  The patient's father died of coronary artery disease.  The  patient's mother is alive and has diabetes.  She has six siblings.  One of  them is a brother with hypertension and diabetes.   REVIEW OF SYSTEMS:  CONSTITUTIONAL:  Positive for fever, chills, fatigue.  EYES:  The patient denies any vision changes  or eye pain.  ENT:  Reports a  dry mouth.  No hearing loss.  CARDIOVASCULAR:  Denies any chest pain,  dyspnea, or edema.  RESPIRATORY:  Denies any cough, sputum, wheezing.  GASTROINTESTINAL:  Denies any nausea or vomiting.  Is positive for diarrhea.  Negative for bleed.  GENITOURINARY:  As per HPI.  SKIN:  No bothersome  rashes.  MUSCULOSKELETAL:  No joint pain, no muscle pain, no back pain.  NEUROLOGIC:  No focal weakness.  She has generalized weakness.  No vertigo.  ENDOCRINE:  No polyuria, no polydipsia, no heat or cold intolerance.  PSYCHIATRIC:  Not obtainable.   PHYSICAL EXAMINATION UPON ADMISSION:  VITAL SIGNS:  Temperature 103.5; blood  pressure 111/62; heart rate 72; respirations 20; saturating 94% on room air.  GENERAL APPEARANCE:  She appears frail and chronically ill.  She is very  obtunded.  She is curled in bed in a fetal position.  HEENT:  Eyes:  Pupils equal, round, and reactive to light and accommodation.  Extraocular movements are intact.  She has intact hearing.  Pharynx is  clear.  Mouth is without ulcerations.  Very dry mucosa.  NECK:  Without JVD.  No thyromegaly.  Trachea is in the midline.  RESPIRATORY:  Clear to auscultation, without wheezes, rhonchi, crackles.  CARDIOVASCULAR:  She is tachycardic, with 3/6 flow murmur, and S3 gallop.  No pedal edema.  GASTROINTESTINAL:  The abdomen is soft, nontender.  Bowel sounds are  present.  She has positive CVA tenderness.  No suprapubic tenderness.  LYMPHATIC:  There is no palpable lymphadenopathy.  MUSCULOSKELETAL:  She has decreased muscle bulk. Good tone.  Full range of  motion in the limbs.  SKIN:  There are no bothersome rashes.  NEUROLOGIC:  Cranial nerves III-XII intact.  Deep tendon reflexes are  symmetric.  Sensation is intact.  Strength is 5/5.  PSYCHIATRIC:  The patient is too obtunded to be tested.   LABORATORY DATA AT THE TIME OF ADMISSION:  Sodium 128, potassium 3.5,  chloride 84, bicarbonate 29, glucose  119, BUN 48, creatinine 4, total  protein 7.1, albumin 2.6, AST 79, ALT 66, alkaline phosphatase 68, bilirubin  1.  UA shows positive hemoglobin, positive leukocyte esterase, 11-20 white  blood cells.  White blood cell count is 18,000, hemoglobin 10, hematocrit  30, neutrophil count 15,000.  Chest x-ray shows no acute disease.   ASSESSMENT AND PLAN:  1.  Sepsis, with most likely urinary source, probably also pyelonephritis.      Plan is to admit the patient to intensive care unit.  Proceed with      aggressive hydration, intravenous antibiotics, and close monitoring.  2.  Acute renal failure.  Most likely secondary to severe dehydration.  Will      proceed with intravenous fluid administration and monitoring the      creatinine.  3.  Increase liver function tests.  Decrease albumin.  Worrisome of chronic      liver disease, but also this could be related just to the acute septic      episode.  Will monitor the values.  4.  Anemia.  Unsure about chronicity.  Will check hemoccult stools.  Check a      ferritin, iron, TIBC.  Transfuse as needed.           ______________________________  Lonia Blood, M.D.     SL/MEDQ  D:  09/25/2004  T:  09/25/2004  Job:  [id number]   cc:   [facility]

## 2010-06-07 NOTE — Consult Note (Signed)
Holly Acosta, Holly Acosta                 ACCOUNT NO.:  [id number]   MEDICAL RECORD NO.:  [id number]          PATIENT TYPE:  INP   LOCATION:  0165                         FACILITY:  [facility]   PHYSICIAN:  Mark C. Vernie Ammons, M.D.  DATE OF BIRTH:  09/12/1955   DATE OF CONSULTATION:  09/26/2004  DATE OF DISCHARGE:                                   CONSULTATION   Patient is a 55 year old black female admitted with acute renal failure.  She had developed altered mental status and was brought into the emergency  room and found to have a creatinine of 4.  She had elevated white count as  well.  A CT scan was obtained, and it revealed bilateral hydronephrosis, and  I was contacted regarding this finding.  In talking with the patient, she  has had some hesitancy just recently.  She also has some frequency and  urgency as well as rare urge incontinence.  Never had any hematuria.  She  has noted a bulging mass from the vagina for approximately a month or so,  which has corresponded to the duration of her hesitancy.   PAST MEDICAL HISTORY:  1.  Hypertension.  2.  Hyperlipidemia.  3.  Uterine prolapse.   CURRENT MEDICATIONS:  Lovenox, Protonix, Cipro, Phenergan.   ALLERGIES:  No known drug allergies.   SOCIAL HISTORY:  Patient lives by herself.  She denies alcohol or tobacco  use.   FAMILY HISTORY:  Father and mother died of coronary artery disease.  No  history of GU malignancy or renal disease.   REVIEW OF SYSTEMS:  Positive for recent fever, chills, and fatigue.  No  chest pain.  She has had some recent diarrhea.  As noted above, otherwise  negative.   PHYSICAL EXAMINATION:  VITAL SIGNS:  Blood pressure 120/70, pulse 70,  temperature 101.5.  GENERAL:  Patient is a well-developed and well-nourished, frail-appearing  black female in no apparent distress.  HEENT:  Normocephalic and atraumatic.  Oropharynx is clear.  NECK:  Supple.  Midline trachea.  CHEST:  Normal respiratory effort.  CARDIOVASCULAR:  Regular rate and rhythm with grade 3 flow murmur.  ABDOMEN:  Soft and nontender without mass or HSM.  GU:  Normal urethra.  Her vagina is essentially everted with the majority of  the uterus lying outside of her vagina.  It does not appear excoriated or  with any lesion.  The uterus was replaced to its normal anatomic position.  No bladder base abnormalities were noted.  Foley catheter was indwelling.  EXTREMITIES:  Without clubbing, cyanosis or edema.  No gross focal  neurological deficits noted.  SKIN:  Warm and dry.   Her CBC now reveals a white count of 14.6.  Her potassium is 2.8.  Sodium  131.  BUN 50, creatinine 4.1.  Urine culture, preliminary, reveals greater than 100,000 colonies of  Proteus.   CT scan was performed of the pelvis only.  She has bilateral hydronephrosis  down to the bladder; however, the ureters could be followed out of the  pelvis and actually into the location of the vagina.  Renal ultrasound, no mass.   IMPRESSION:  1.  Proteus urinary tract infection:  Sensitivities are pending.  She is      currently on Cipro.  2.  Bilateral hydronephrosis:  This is undoubtedly due to the uterus/bladder      prolapsing out of her vagina, which is kinking both her ureters.  She      has a Foley catheter in place, but that would not help, as the ureters      are kinked above the level of where the catheter is, and the only      treatment is replacement of her uterus in its normal anatomic position,      which was performed.  This alone should unkink her ureters; however, she      is going to need a permanent solution.  3.  Voiding symptoms:  These could be due to her urinary tract infection but      also likely from the prolapse.  I think as both of these are treated,      that should resolve.   RECOMMENDATION:  1.  I replaced her uterus into the vagina, its normal anatomic position,      which will unkink her ureters and likely resolve her  hydronephrosis and      renal failure.  2.  Agree with Cipro until sensitivities are known.  3.  I recommend GYN consult for pessary placement versus hysterectomy.      Mark C. Vernie Ammons, M.D.  Electronically Signed     MCO/MEDQ  D:  09/26/2004  T:  09/26/2004  Job:  [id number]

## 2011-11-04 ENCOUNTER — Encounter: Payer: Self-pay | Admitting: Family Medicine

## 2011-11-04 ENCOUNTER — Ambulatory Visit (INDEPENDENT_AMBULATORY_CARE_PROVIDER_SITE_OTHER): Payer: Medicaid Other | Admitting: Family Medicine

## 2011-11-04 VITALS — BP 153/90 | HR 64 | Ht 65.0 in | Wt 193.0 lb

## 2011-11-04 DIAGNOSIS — E7800 Pure hypercholesterolemia, unspecified: Secondary | ICD-10-CM

## 2011-11-04 DIAGNOSIS — Z23 Encounter for immunization: Secondary | ICD-10-CM

## 2011-11-04 DIAGNOSIS — I10 Essential (primary) hypertension: Secondary | ICD-10-CM

## 2011-11-04 DIAGNOSIS — E119 Type 2 diabetes mellitus without complications: Secondary | ICD-10-CM

## 2011-11-04 LAB — POCT GLYCOSYLATED HEMOGLOBIN (HGB A1C): Hemoglobin A1C: 10.7

## 2011-11-04 MED ORDER — GLUCOSE BLOOD VI STRP: ORAL_STRIP | Status: DC

## 2011-11-04 MED ORDER — ACCU-CHEK NANO SMARTVIEW W/DEVICE KIT: 1.0000 | PACK | Freq: Every day | Status: DC

## 2011-11-04 MED ORDER — ACCU-CHEK FASTCLIX LANCETS MISC: 1.0000 | Freq: Every day | Status: DC

## 2011-11-04 NOTE — Assessment & Plan Note (Signed)
>>  ASSESSMENT AND PLAN FOR HYPERTENSION, BENIGN ESSENTIAL WRITTEN ON 11/04/2011  3:03 PM BY HAIRFORD, AMBER M, MD  Currently on 4 medications and not at goal. Will RTC in 4 months and re-evaluate blood pressure. Encourage patient to diet and exercise adn continue taking all medications. If she remains elevated will consider referral to ambulatory BP monitoring prior to adjusting medications.

## 2011-11-04 NOTE — Assessment & Plan Note (Signed)
Last labs at goal, per patient. Awaiting records. Continue Pravastatin, Will check direct LDL with next lab draw.

## 2011-11-04 NOTE — Assessment & Plan Note (Signed)
Uncontrolled. A1C 10.8. Will give Rx for new meter. She will check CBG and keep log. Monitor for highs/lows. Continue all medications currently and will readdress in one month. Patient is aware of possibility of starting insulin.

## 2011-11-04 NOTE — Patient Instructions (Signed)
It was nice to meet you today. Please make an appointment to come back in 4-6 weeks for lab work and to check up on your high blood pressure and diabetes.  Please make an eye appointment and a mammogram appointment.  Let me know if you need anything. Take care! Amber M. Hairford, M.D.

## 2011-11-04 NOTE — Assessment & Plan Note (Signed)
Currently on 4 medications and not at goal. Will RTC in 4 months and re-evaluate blood pressure. Encourage patient to diet and exercise adn continue taking all medications. If she remains elevated will consider referral to ambulatory BP monitoring prior to adjusting medications.

## 2011-11-04 NOTE — Assessment & Plan Note (Signed)
>>  ASSESSMENT AND PLAN FOR UNCONTROLLED TYPE 2 DIABETES MELLITUS WITH HYPERGLYCEMIA ([facility]) WRITTEN ON 11/04/2011  2:52 PM BY HAIRFORD, AMBER M, MD  Uncontrolled. A1C 10.8. Will give Rx for new meter. She will check CBG and keep log. Monitor for highs/lows. Continue all medications currently and will readdress in one month. Patient is aware of possibility of starting insulin.

## 2011-11-04 NOTE — Progress Notes (Signed)
Patient ID: Holly Acosta, female   DOB: 06/20/1955, 56 y.o.   MRN: [id number] [name] Umberto Pavek M. Holly Hoge, MD Phone: [phone]   Subjective: HPI: Patient is a 56 y.o. female presenting to clinic today for new patient visit. Concerns today include None  1. Hypertension Blood pressure at home:Does not measure Blood pressure today: 153/90 Taking Meds: Yes Side effects: None ROS: Denies headache, visual changes, nausea, vomiting, chest pain, abdominal pain or shortness of breath.  2.  Diabetes:  High/Low at home: Does not check (no meter). Unsure of last A1C Taking medications: Yes Side effects: None ROS: denies fever, chills, dizziness, loss of conscieness, polyuria, polydipsia, numbness or tingling in extremities or chest pain.  3. HLD- Last check 3 months ago, at goal per patient.   Past Medical History  Diagnosis Date  . Diabetes mellitus, type 2   . HTN (hypertension)   . HLD (hyperlipidemia)   . Sleep apnea   . Prolapsed uterus   . Arthritis of knee    Past Surgical History  Procedure Date  . Cardiac catheterization     2011   History   Social History  . Marital Status: Divorced    Spouse Name: N/A    Number of Children: N/A  . Years of Education: N/A   Social History Main Topics  . Smoking status: Former Games developer  . Smokeless tobacco: Former Neurosurgeon    Quit date: 11/04/2006  . Alcohol Use: No  . Drug Use: No  . Sexually Active: None   Other Topics Concern  . None   Social History Narrative   Lives with daughter Toniann Fail age 26) and 4 grandchildren.Disability - Arthritis. Does get disability check.Does not have a living will yet   History Reviewed: former smoker. Health Maintenance:  - Needs Flu shot today - Last mammogram 2 years - Colonoscopy 2 years - Last lab work 3 months ago  ROS: Please see HPI above.  Objective: Office vital signs reviewed.  Physical Examination:  General: Awake, alert. NAD HEENT: Atraumatic,  normocephalic Neck: No LAD Pulm: CTAB, no wheezes Cardio: RRR, no murmurs appreciated Abdomen:+BS, soft, nontender, nondistended. Midline hernia Extremities: No edema. TTP knees bilaterally Neuro: Grossly intact  Assessment: 56 yo F New Patient appointment  Plan: See Problem List and After Visit Summary

## 2011-12-03 ENCOUNTER — Other Ambulatory Visit: Payer: Self-pay | Admitting: Family Medicine

## 2011-12-11 ENCOUNTER — Encounter: Payer: Self-pay | Admitting: Family Medicine

## 2011-12-11 ENCOUNTER — Ambulatory Visit (INDEPENDENT_AMBULATORY_CARE_PROVIDER_SITE_OTHER): Payer: Medicaid Other | Admitting: Family Medicine

## 2011-12-11 VITALS — BP 153/82 | HR 66 | Temp 97.6°F | Ht 65.0 in | Wt 192.4 lb

## 2011-12-11 DIAGNOSIS — M1710 Unilateral primary osteoarthritis, unspecified knee: Secondary | ICD-10-CM | POA: Insufficient documentation

## 2011-12-11 DIAGNOSIS — IMO0001 Reserved for inherently not codable concepts without codable children: Secondary | ICD-10-CM

## 2011-12-11 DIAGNOSIS — M179 Osteoarthritis of knee, unspecified: Secondary | ICD-10-CM | POA: Insufficient documentation

## 2011-12-11 DIAGNOSIS — E7800 Pure hypercholesterolemia, unspecified: Secondary | ICD-10-CM

## 2011-12-11 DIAGNOSIS — I10 Essential (primary) hypertension: Secondary | ICD-10-CM

## 2011-12-11 DIAGNOSIS — IMO0002 Reserved for concepts with insufficient information to code with codable children: Secondary | ICD-10-CM

## 2011-12-11 LAB — BASIC METABOLIC PANEL
BUN: 14 mg/dL (ref 6–23)
Chloride: 101 mEq/L (ref 96–112)
Creat: 0.86 mg/dL (ref 0.50–1.10)
Glucose, Bld: 106 mg/dL — ABNORMAL HIGH (ref 70–99)

## 2011-12-11 LAB — CBC
HCT: 40.3 % (ref 36.0–46.0)
Hemoglobin: 13.6 g/dL (ref 12.0–15.0)
MCV: 84.3 fL (ref 78.0–100.0)
RDW: 14.8 % (ref 11.5–15.5)
WBC: 8.2 10*3/uL (ref 4.0–10.5)

## 2011-12-11 MED ORDER — ATENOLOL 50 MG PO TABS: 50.0000 mg | ORAL_TABLET | Freq: Every day | ORAL | Status: DC

## 2011-12-11 MED ORDER — AMLODIPINE BESYLATE 10 MG PO TABS: 10.0000 mg | ORAL_TABLET | Freq: Every day | ORAL | Status: DC

## 2011-12-11 MED ORDER — DICLOFENAC SODIUM 75 MG PO TBEC: 75.0000 mg | DELAYED_RELEASE_TABLET | Freq: Two times a day (BID) | ORAL | Status: DC

## 2011-12-11 NOTE — Patient Instructions (Signed)
It was good to see you today! I am glad everything is going well. Please make an appointment in the front to see Dr. Raymondo Band in pharmacy clinic. Then, come back to see me in 6-8 weeks for follow up.  I will refill your medications today as well.  Take care! Sinda Leedom M. Zianne Schubring, M.D.

## 2011-12-11 NOTE — Assessment & Plan Note (Signed)
>>  ASSESSMENT AND PLAN FOR UNCONTROLLED TYPE 2 DIABETES MELLITUS WITH HYPERGLYCEMIA (HCC) WRITTEN ON 12/11/2011  2:07 PM BY HAIRFORD, AMBER M, MD  Uncontrolled. Still taking metformin and glipizide. Patient's diet is not carb modified, and seems to have limited knowledge. Will check CBC, Bmet and direct LDL today. Encouraged patient to see Dr. Raymondo Band about DM and perhaps consider insulin for better control. Patient agrees.

## 2011-12-11 NOTE — Addendum Note (Signed)
Addended by: Hilarie Fredrickson on: 12/11/2011 03:23 PM   Modules accepted: Orders

## 2011-12-11 NOTE — Progress Notes (Signed)
Patient ID: Holly Acosta, female   DOB: 06-13-1955, 56 y.o.   MRN: [id number] [facility] Holly M. Hairford, MD Phone: [phone]  Subjective: HPI: Patient is a 56 y.o. female presenting to clinic today for follow up appointment. Concerns today include: none.  1. Hypertension: Does not monitor at home Blood pressure today: 153/82 Taking Meds: Takes Novasc 10, Atenolol 50, HCTZ 25, Lisinopril 40mg  Side effects: No problems ROS: Denies, nausea, vomiting, chest pain, abdominal pain or shortness of breath. Occasional headaches and blurred vision.    2. Diabetes: Checks blood sugar BID. Last A1C was 10.7 High at home: 208 Low at home: 176 Taking medications: Takes Metformin and Glipizide Side effects: None ROS: denies fever, chills, dizziness, polyuria, poly dipsia numbness or tingling in extremities or chest pain.  3. Hyperlipemia: Takes Pravachol 20mg  daily. Last lipid panel was in January 2012. LDL was 136 and was started on medicine them. Patient states her diet is "not good." 24 hour recall: breakfast had bagel, 2 scrambled eggs and coffee with splenda. Lunch she had a [place] sandwich and water. Dinner varies, but last night had green beans, corndog.  4. Bilateral knee pain: Known arthritis. Not taking anything at home. She states it interferes with her daily life. Mild swelling, no redness, no rash. Getting progressively worse.   History Reviewed: Former smoker. Health Maintenance: UTD on flu shot  ROS: Please see HPI above.  Objective: Office vital signs reviewed.  Physical Examination:  General: Awake, alert. NAD. HEENT: Atraumatic, normocephalic Neck: No masses palpated. No LAD Pulm: CTAB, no wheezes Cardio: RRR, no murmurs appreciated Abdomen:+BS, soft, nontender, nondistended Extremities: No edema. Limited ROM of knees bilaterally. Mild swelling. Abnormal gait. Neuro: Grossly intact  Assessment: 56 yo F follow up appointment  Plan: See  Problem List and After Visit Summary

## 2011-12-11 NOTE — Assessment & Plan Note (Signed)
On Pravastatin. Will check direct LDL today. If >100, will increase dose with next refill. Patient aware.

## 2011-12-11 NOTE — Assessment & Plan Note (Signed)
On 4 antihypertensives and systolic not at goal. Will continue current regimen and continue to monitor. If remains elevated, or stays higher, will consider amb BP monitoring. Patient also encouraged to change diet and exercise.

## 2011-12-11 NOTE — Assessment & Plan Note (Signed)
Uncontrolled. Still taking metformin and glipizide. Patient's diet is not carb modified, and seems to have limited knowledge. Will check CBC, Bmet and direct LDL today. Encouraged patient to see Dr. Raymondo Band about DM and perhaps consider insulin for better control. Patient agrees.

## 2011-12-11 NOTE — Assessment & Plan Note (Signed)
Will treat with short term NSAID. If renal function abnormal on today's lab, will limit NSAID. Patient encouraged to follow up soon if her knees worsen.

## 2011-12-11 NOTE — Assessment & Plan Note (Signed)
>>  ASSESSMENT AND PLAN FOR HYPERTENSION, BENIGN ESSENTIAL WRITTEN ON 12/11/2011  2:08 PM BY HAIRFORD, AMBER M, MD  On 4 antihypertensives and systolic not at goal. Will continue current regimen and continue to monitor. If remains elevated, or stays higher, will consider amb BP monitoring. Patient also encouraged to change diet and exercise.

## 2011-12-25 ENCOUNTER — Ambulatory Visit (INDEPENDENT_AMBULATORY_CARE_PROVIDER_SITE_OTHER): Payer: Medicaid Other | Admitting: Pharmacist

## 2011-12-25 MED ORDER — INSULIN GLARGINE 100 UNIT/ML ~~LOC~~ SOLN
10.0000 [IU] | Freq: Every day | SUBCUTANEOUS | Status: DC
Start: 2011-12-25 — End: 2011-12-26

## 2011-12-25 MED ORDER — INSULIN PEN NEEDLE 31G X 5 MM MISC: 1.0000 | Freq: Every day | Status: DC

## 2011-12-25 NOTE — Patient Instructions (Addendum)
Start taking Lantus 10 units each morning.  Check your blood sugar every morning.  Try to drink unsweet tea with sweetener rather than sweet tea. Also try to drink diet soda (like Sprite Zero).  Add a unit each day until your blood sugar is close to 100. For example, if you blood sugar is higher than 100 on Friday, take 11 units. If it is still higher than 100 on Saturday, take 12 units. Continue to increase by a unit each day until you reach 100.   I know that you are nervous about using needles but you can do it! If you have an questions or concerns, please let me know.   Please schedule an appointment with Dr. Raymondo Band in early January.

## 2011-12-26 ENCOUNTER — Encounter: Payer: Self-pay | Admitting: Pharmacist

## 2011-12-26 MED ORDER — INSULIN GLARGINE 100 UNIT/ML ~~LOC~~ SOLN: 10.0000 [IU] | Freq: Every day | SUBCUTANEOUS | Status: DC

## 2011-12-26 NOTE — Assessment & Plan Note (Signed)
Diabetes of 4-5 yrs duration currently under poor control of blood glucose based on  . Lab Results  Component Value Date   HGBA1C 10.7 11/04/2011   and home readings consistently >200.   Control is suboptimal due to dietary indiscretion and sedentary lifestyle combined with suboptimal medication regimen.  Instructed on technique and patient was able to verbalize and demonstrate adequate technique.  She administered her first dose of Lantus 10 units in office without error. She was able to verbalize appropriate hypoglycemia management plan. Started basal insulin Lantus (insulin glargine). Patient will continue to titrate 1 unit/day if fasting CBGs > 100mg /dl until fasting CBGs reach goal or next visit.  Written patient instructions provided.  Follow up in  Pharmacist Clinic Visit in early January.   Total time in face to face counseling 35 minutes.  Patient seen with Juanita Craver, PharmD student and Rachael Darby Med Student. Marland Kitchen

## 2011-12-26 NOTE — Assessment & Plan Note (Signed)
>>  ASSESSMENT AND PLAN FOR UNCONTROLLED TYPE 2 DIABETES MELLITUS WITH HYPERGLYCEMIA (HCC) WRITTEN ON 12/26/2011  9:50 AM BY KOVAL, PETER G, PHARMD  Diabetes of 4-5 yrs duration currently under poor control of blood glucose based on  . Lab Results  Component Value Date   HGBA1C 10.7 11/04/2011   and home readings consistently >200.   Control is suboptimal due to dietary indiscretion and sedentary lifestyle combined with suboptimal medication regimen.  Instructed on technique and patient was able to verbalize and demonstrate adequate technique.  She administered her first dose of Lantus 10 units in office without error. She was able to verbalize appropriate hypoglycemia management plan. Started basal insulin Lantus (insulin glargine). Patient will continue to titrate 1 unit/day if fasting CBGs > 100mg /dl until fasting CBGs reach goal or next visit.  Written patient instructions provided.  Follow up in  Pharmacist Clinic Visit in early January.   Total time in face to face counseling 35 minutes.  Patient seen with Juanita Craver, PharmD student and Rachael Darby Med Student. Marland Kitchen

## 2011-12-26 NOTE — Progress Notes (Signed)
  Subjective:    Patient ID: Holly Acosta, female    DOB: 15-Aug-1955, 56 y.o.   MRN: [id number]  HPI Patient arrives in good spirits for insulin initiation.  She appears modestly apprehensive about starting "shots".   She states she has never administered any shots.  She is willing to start shots of insulin glargine today.     Review of Systems     Objective:   Physical Exam        Assessment & Plan:   Diabetes of 4-5 yrs duration currently under poor control of blood glucose based on  . Lab Results  Component Value Date   HGBA1C 10.7 11/04/2011   and home readings consistently >200.   Control is suboptimal due to dietary indiscretion and sedentary lifestyle combined with suboptimal medication regimen.  Instructed on technique and patient was able to verbalize and demonstrate adequate technique.  She administered her first dose of Lantus 10 units in office without error. She was able to verbalize appropriate hypoglycemia management plan. Started basal insulin Lantus (insulin glargine). Patient will continue to titrate 1 unit/day if fasting CBGs > 100mg /dl until fasting CBGs reach goal or next visit.  Written patient instructions provided.  Follow up in  Pharmacist Clinic Visit in early January.   Total time in face to face counseling 35 minutes.  Patient seen with Juanita Craver, PharmD student and Rachael Darby Med Student. Marland Kitchen

## 2011-12-26 NOTE — Progress Notes (Signed)
Patient ID: Holly Acosta, female   DOB: 08-14-1955, 56 y.o.   MRN: [id number] Reviewed and agree with Dr. Macky Lower documentation and management.

## 2012-01-01 ENCOUNTER — Other Ambulatory Visit: Payer: Self-pay | Admitting: Family Medicine

## 2012-01-23 ENCOUNTER — Encounter: Payer: Self-pay | Admitting: Pharmacist

## 2012-01-23 ENCOUNTER — Ambulatory Visit (INDEPENDENT_AMBULATORY_CARE_PROVIDER_SITE_OTHER): Payer: Medicaid Other | Admitting: Pharmacist

## 2012-01-23 VITALS — BP 146/86 | HR 100 | Ht 65.0 in | Wt 193.8 lb

## 2012-01-23 DIAGNOSIS — I10 Essential (primary) hypertension: Secondary | ICD-10-CM

## 2012-01-23 NOTE — Patient Instructions (Addendum)
Increase your insulin to 19 units tomorrow. If your sugar is over 100, continue to increase by 1 unit each day. Continue checking blood sugar in the mornings, but start checking it around meals and bedtime as well.  Continue watching your diet and being as active as you can. Bring your meter to your visit with Dr. Mikel Cella next week.  Follow up with Dr. Raymondo Band in 1 month after visit with Dr. Mikel Cella.

## 2012-01-23 NOTE — Assessment & Plan Note (Signed)
>>  ASSESSMENT AND PLAN FOR UNCONTROLLED TYPE 2 DIABETES MELLITUS WITH HYPERGLYCEMIA (HCC) WRITTEN ON 01/23/2012 11:47 AM BY KOVAL, PETER G, PHARMD  Diabetes of >5 yrs duration currently under fair to good control of blood glucose based on average home fasting CBG readings of 200. Control is suboptimal due to suboptimal insulin dose with recent initiation of insulin. Denies hypoglycemic events.  Able to verbalize appropriate hypoglycemia management plan. Increased dose of basal insulin Lantus (insulin glargine) with self-titration. Patient will continue to titrate 1 unit/day if fasting CBGs > 100mg /dl until fasting CBGs reach goal or next visit.  Anticipate basal dose to be 25-30 units daily. Written patient instructions provided.  Follow up with Dr. Mikel Cella in 1 week and in  Pharmacist Clinic Visit 1 month. Will continue to adjust based on fasting and random CBGs in future.  Total time in face to face counseling 20 minutes.  Patient seen with Doris Cheadle, PharmD.  Blood pressure greater than 140 systolic. Will continue to follow. Pt is currently on 3 drug regimen. May consider change of atenolol to carvedilol in future.

## 2012-01-23 NOTE — Assessment & Plan Note (Signed)
Blood pressure greater than 140 systolic. Will continue to follow. Pt is currently on 3 drug regimen. May consider change of atenolol to carvedilol in future.

## 2012-01-23 NOTE — Assessment & Plan Note (Signed)
Diabetes of >5 yrs duration currently under fair to good control of blood glucose based on average home fasting CBG readings of 200. Control is suboptimal due to suboptimal insulin dose with recent initiation of insulin. Denies hypoglycemic events.  Able to verbalize appropriate hypoglycemia management plan. Increased dose of basal insulin Lantus (insulin glargine) with self-titration. Patient will continue to titrate 1 unit/day if fasting CBGs > 100mg /dl until fasting CBGs reach goal or next visit.  Anticipate basal dose to be 25-30 units daily. Written patient instructions provided.  Follow up with Dr. Mikel Cella in 1 week and in  Pharmacist Clinic Visit 1 month. Will continue to adjust based on fasting and random CBGs in future.  Total time in face to face counseling 20 minutes.  Patient seen with Doris Cheadle, PharmD.  Blood pressure greater than 140 systolic. Will continue to follow. Pt is currently on 3 drug regimen. May consider change of atenolol to carvedilol in future.

## 2012-01-23 NOTE — Assessment & Plan Note (Signed)
>>  ASSESSMENT AND PLAN FOR HYPERTENSION, BENIGN ESSENTIAL WRITTEN ON 01/23/2012 11:47 AM BY KOVAL, PETER G, PHARMD  Blood pressure greater than 140 systolic. Will continue to follow. Pt is currently on 3 drug regimen. May consider change of atenolol to carvedilol in future.

## 2012-01-23 NOTE — Progress Notes (Signed)
  Subjective:    Patient ID: Holly Acosta, female    DOB: 1955-05-19, 57 y.o.   MRN: [id number]  HPI Pt arrives in good spirits. She reports her nocturia has decreased from 2-3 times/night to once/night since starting the insulin. Denies hypoglycemic events. Pt verbalized goal weight of 145 lbs.    Review of Systems     Objective:   Physical Exam        Assessment & Plan:   Diabetes of >5 yrs duration currently under fair to good control of blood glucose based on average home fasting CBG readings of 200. Control is suboptimal due to suboptimal insulin dose with recent initiation of insulin. Denies hypoglycemic events.  Able to verbalize appropriate hypoglycemia management plan. Increased dose of basal insulin Lantus (insulin glargine) with self-titration. Patient will continue to titrate 1 unit/day if fasting CBGs > 100mg /dl until fasting CBGs reach goal or next visit.  Anticipate basal dose to be 25-30 units daily. Written patient instructions provided.  Follow up with Dr. Mikel Cella in 1 week and in  Pharmacist Clinic Visit 1 month. Will continue to adjust based on fasting and random CBGs in future.  Total time in face to face counseling 20 minutes.  Patient seen with Doris Cheadle, PharmD.  Blood pressure greater than 140 systolic. Will continue to follow. Pt is currently on 3 drug regimen. May consider change of atenolol to carvedilol in future.

## 2012-01-26 NOTE — Progress Notes (Signed)
Patient ID: Holly Acosta, female   DOB: 11/11/1955, 56 y.o.   MRN: [id number] Reviewed: Agree with Dr. Koval's documentation and management. 

## 2012-01-29 ENCOUNTER — Ambulatory Visit (INDEPENDENT_AMBULATORY_CARE_PROVIDER_SITE_OTHER): Payer: Medicaid Other | Admitting: Family Medicine

## 2012-01-29 VITALS — BP 130/76 | HR 69 | Temp 98.0°F | Ht 64.5 in | Wt 191.0 lb

## 2012-01-29 DIAGNOSIS — E669 Obesity, unspecified: Secondary | ICD-10-CM

## 2012-01-29 DIAGNOSIS — I10 Essential (primary) hypertension: Secondary | ICD-10-CM

## 2012-01-29 NOTE — Patient Instructions (Signed)
It was good to see you today. Everything looks good. Congratulations on your blood pressure. Keep up the good work with your insulin.  I will see you in March! Holly Acosta Holly Acosta, HollyD.  1800 Calorie Diet for Diabetes Meal Planning The 1800 calorie diet is designed for eating up to 1800 calories each day. Following this diet and making healthy meal choices can help improve overall health. This diet controls blood sugar (glucose) levels and can also help lower blood pressure and cholesterol. SERVING SIZES Measuring foods and serving sizes helps to make sure you are getting the right amount of food. The list below tells how big or small some common serving sizes are:  1 oz.........4 stacked dice.  3 oz........Marland KitchenDeck of cards.  1 tsp.......Marland KitchenTip of little finger.  1 tbs......Marland KitchenMarland KitchenThumb.  2 tbs.......Marland KitchenGolf ball.   cup......Marland KitchenHalf of a fist.  1 cup.......Marland KitchenA fist. GUIDELINES FOR CHOOSING FOODS The goal of this diet is to eat a variety of foods and limit calories to 1800 each day. This can be done by choosing foods that are low in calories and fat. The diet also suggests eating small amounts of food frequently. Doing this helps control your blood glucose levels so they do not get too high or too low. Each meal or snack may include a protein food source to help you feel more satisfied and to stabilize your blood glucose. Try to eat about the same amount of food around the same time each day. This includes weekend days, travel days, and days off work. Space your meals about 4 to 5 hours apart and add a snack between them if you wish.  For example, a daily food plan could include breakfast, a morning snack, lunch, dinner, and an evening snack. Healthy meals and snacks include whole grains, vegetables, fruits, lean meats, poultry, fish, and dairy products. As you plan your meals, select a variety of foods. Choose from the bread and starch, vegetable, fruit, dairy, and meat/protein groups. Examples of foods  from each group and their suggested serving sizes are listed below. Use measuring cups and spoons to become familiar with what a healthy portion looks like. Bread and Starch Each serving equals 15 grams of carbohydrates.  1 slice bread.   bagel.   cup cold cereal (unsweetened).   cup hot cereal or mashed potatoes.  1 small potato (size of a computer mouse).   cup cooked pasta or rice.   English muffin.  1 cup broth-based soup.  3 cups of popcorn.  4 to 6 whole-wheat crackers.   cup cooked beans, peas, or corn. Vegetable Each serving equals 5 grams of carbohydrates.   cup cooked vegetables.  1 cup raw vegetables.   cup tomato or vegetable juice. Fruit Each serving equals 15 grams of carbohydrates.  1 small apple or orange.  1 cup watermelon or strawberries.   cup applesauce (no sugar added).  2 tbs raisins.   banana.   cup canned fruit, packed in water, its own juice, or sweetened with a sugar substitute.   cup unsweetened fruit juice. Dairy Each serving equals 12 to 15 grams of carbohydrates.  1 cup fat-free milk.  6 oz artificially sweetened yogurt or plain yogurt.  1 cup low-fat buttermilk.  1 cup soy milk.  1 cup almond milk. Meat/Protein  1 large egg.  2 to 3 oz meat, poultry, or fish.   cup low-fat cottage cheese.  1 tbs peanut butter.  1 oz low-fat cheese.   cup tuna in water.  cup tofu. Fat  1 tsp oil.  1 tsp trans-fat-free margarine.  1 tsp butter.  1 tsp mayonnaise.  2 tbs avocado.  1 tbs salad dressing.  1 tbs cream cheese.  2 tbs sour cream. SAMPLE 1800 CALORIE DIET PLAN Breakfast   cup unsweetened cereal (1 carb serving).  1 cup fat-free milk (1 carb serving).  1 slice whole-wheat toast (1 carb serving).   small banana (1 carb serving).  1 scrambled egg.  1 tsp trans-fat-free margarine. Lunch  Tuna sandwich.  2 slices whole-wheat bread (2 carb servings).   cup canned tuna in  water, drained.  1 tbs reduced fat mayonnaise.  1 stalk celery, chopped.  2 slices tomato.  1 lettuce leaf.  1 cup carrot sticks.  24 to 30 seedless grapes (2 carb servings).  6 oz light yogurt (1 carb serving). Afternoon Snack  3 graham cracker squares (1 carb serving).  Fat-free milk, 1 cup (1 carb serving).  1 tbs peanut butter. Dinner  3 oz salmon, broiled with 1 tsp oil.  1 cup mashed potatoes (2 carb servings) with 1 tsp trans-fat-free margarine.  1 cup fresh or frozen green beans.  1 cup steamed asparagus.  1 cup fat-free milk (1 carb serving). Evening Snack  3 cups air-popped popcorn (1 carb serving).  2 tbs parmesan cheese sprinkled on top. MEAL PLAN Use this worksheet to help you make a daily meal plan based on the 1800 calorie diet suggestions. If you are using this plan to help you control your blood glucose, you may interchange carbohydrate-containing foods (dairy, starches, and fruits). Select a variety of fresh foods of varying colors and flavors. The total amount of carbohydrate in your meals or snacks is more important than making sure you include all of the food groups every time you eat. Choose from the following foods to build your day's meals:  8 Starches.  4 Vegetables.  3 Fruits.  2 Dairy.  6 to 7 oz Meat/Protein.  Up to 4 Fats. Your dietician can use this worksheet to help you decide how many servings and which types of foods are right for you. BREAKFAST Food Group and Servings / Food Choice Starch ________________________________________________________ Dairy _________________________________________________________ Fruit _________________________________________________________ Meat/Protein __________________________________________________ Fat ___________________________________________________________ LUNCH Food Group and Servings / Food Choice Starch ________________________________________________________ Meat/Protein  __________________________________________________ Vegetable _____________________________________________________ Fruit _________________________________________________________ Dairy _________________________________________________________ Fat ___________________________________________________________ Aura Fey Food Group and Servings / Food Choice Starch ________________________________________________________ Meat/Protein __________________________________________________ Fruit __________________________________________________________ Dairy _________________________________________________________ Laural Golden Food Group and Servings / Food Choice Starch _________________________________________________________ Meat/Protein ___________________________________________________ Dairy __________________________________________________________ Vegetable ______________________________________________________ Fruit ___________________________________________________________ Fat ____________________________________________________________ Lollie Sails Food Group and Servings / Food Choice Fruit __________________________________________________________ Meat/Protein ___________________________________________________ Dairy __________________________________________________________ Starch _________________________________________________________ DAILY TOTALS Starch ____________________________ Vegetable _________________________ Fruit _____________________________ Dairy _____________________________ Meat/Protein______________________ Fat _______________________________ Document Released: 07/29/2004 Document Revised: 03/31/2011 Document Reviewed: 11/22/2010 [employer] Patient Information 2013 [place], [place].

## 2012-01-29 NOTE — Assessment & Plan Note (Signed)
>>  ASSESSMENT AND PLAN FOR HYPERTENSION, BENIGN ESSENTIAL WRITTEN ON 01/29/2012  2:24 PM BY HAIRFORD, AMBER M, MD  BP at goal today! This is the lowest her blood pressure has been in years. I am not sure what has changed, but I am very happy to see her systolic of 130 today. Continue current regimen. Strongly encouraged weight loss.

## 2012-01-29 NOTE — Assessment & Plan Note (Signed)
Talked briefly about her weight, and how it will improve her DM and HTN. She acknowledges she should lose weight. GIven a handout on diabetic meal planning, and will discuss with her more at next visit ways to help lower her weight.

## 2012-01-29 NOTE — Assessment & Plan Note (Signed)
BP at goal today! This is the lowest her blood pressure has been in years. I am not sure what has changed, but I am very happy to see her systolic of 130 today. Continue current regimen. Strongly encouraged weight loss.

## 2012-01-29 NOTE — Assessment & Plan Note (Signed)
Continue current regimen. Follow up with Dr. Raymondo Band. Will need A1C and microalbumin at next visit.

## 2012-01-29 NOTE — Progress Notes (Signed)
Patient ID: ELENORE WANNINGER, female   DOB: 17-Dec-1955, 57 y.o.   MRN: [id number] [name] Hence Derrick M. Shizuko Wojdyla, MD Phone: [phone]  Subjective: HPI: Patient is a 57 y.o. female presenting to clinic today for follow up. Concerns today include: None  1. Diabetes: Been seeing Dr. Raymondo Band. Started on Insulin one month ago. Tolerating the injections well. Checking CBG twice daily. Plans to follow up with Dr. Raymondo Band in one month. Working on diet and overall feeling better. High at home: 203 Low at home: 137, and has not had any hypoglycemic events since starting insulin Taking medications: Taking Lantus, started at 10 but now at 24. Metformin and Glipizide Side effects: None. Some tiredness ROS: denies fever, chills, dizziness, LOC, polyuria improving, polydipsia, numbness or tingling in extremities or chest pain.  2. Hypertension Blood pressure at home: Does not check at home Blood pressure today: 130/76 Taking Meds:Yes, on Lisinopril, HCTZ, Norvasc and Atenolol Side effects: None ROS: Denies headache, visual changes, nausea, vomiting, chest pain, abdominal pain or shortness of breath.  History Reviewed: Former smoker. Health Maintenance: UTD  ROS: Please see HPI above.  Objective: Office vital signs reviewed.  Physical Examination:  General: Awake, alert. NAD HEENT: Atraumatic, normocephalic Neck: No masses palpated. No LAD Pulm: CTAB, no wheezes Cardio: RRR, no murmurs appreciated Abdomen: Obese, +BS, soft, nontender, nondistended Extremities: No edema Neuro: Grossly intact  Assessment: 57 yo F follow up appointment  Plan: See Problem List and After Visit Summary

## 2012-01-29 NOTE — Assessment & Plan Note (Signed)
>>  ASSESSMENT AND PLAN FOR UNCONTROLLED TYPE 2 DIABETES MELLITUS WITH HYPERGLYCEMIA (HCC) WRITTEN ON 01/29/2012  2:04 PM BY HAIRFORD, AMBER M, MD  Continue current regimen. Follow up with Dr. Raymondo Band. Will need A1C and microalbumin at next visit.

## 2012-02-09 ENCOUNTER — Other Ambulatory Visit: Payer: Self-pay | Admitting: Family Medicine

## 2012-02-20 ENCOUNTER — Ambulatory Visit (INDEPENDENT_AMBULATORY_CARE_PROVIDER_SITE_OTHER): Payer: Medicaid Other | Admitting: Pharmacist

## 2012-02-20 ENCOUNTER — Encounter: Payer: Self-pay | Admitting: Pharmacist

## 2012-02-20 VITALS — BP 148/85 | HR 70 | Temp 98.1°F | Ht 64.5 in | Wt 190.4 lb

## 2012-02-20 DIAGNOSIS — I10 Essential (primary) hypertension: Secondary | ICD-10-CM

## 2012-02-20 NOTE — Progress Notes (Signed)
Patient ID: Holly Acosta, female   DOB: [birth year], 57 y.o.   MRN: [id number] Reviewed: Agree with Dr. Macky Lower documentation and management.

## 2012-02-20 NOTE — Assessment & Plan Note (Signed)
Diabetes of several yrs duration currently under good and improved control of blood glucose based on home fasting CBG readings of 130-200.  Control is improved however patient would like to target weight goal of 145 lbs in the future.  Denies hypoglycemic events.  Following education, able to verbalize appropriate hypoglycemia management plan (information sheet provided). Continued basal insulin Lantus (insulin glargine) at dose of 25 units each AM. Asked to check both AM and one additioanl CBG daily (postprandials).  Written patient instructions provided.  Follow up in  Pharmacist Clinic Visit PRN after next visit with Dr. Mikel Cella..   Total time in face to face counseling 20 minutes.

## 2012-02-20 NOTE — Patient Instructions (Addendum)
Continue Lantus 25 units in the morning.   Continue checking blood sugar in the AM and 1 other time most days.   Bring your meter - next visit.   Goal readings on your meter should be 100-200 all day.   Next visit with Dr. Mikel Cella - 3-4 weeks

## 2012-02-20 NOTE — Assessment & Plan Note (Signed)
>>  ASSESSMENT AND PLAN FOR HYPERTENSION, BENIGN ESSENTIAL WRITTEN ON 02/20/2012 12:04 PM BY KOVAL, Lubertha Basque, PHARMD  Consider reassessment of current drug therapy plan for Hypertension at next visit.    She should be rehydrated and has been improved control of CBGs which should have decreased stress.

## 2012-02-20 NOTE — Progress Notes (Signed)
  Subjective:    Patient ID: Holly Acosta, female    DOB: November 08, 1955, 57 y.o.   MRN: [id number]  HPI  Patient arrives in good spirits for follow up of recent initiation of Lantus insulin.   Patient reports taking 24 units in the AM and recently switch to a new pen.   She did NOT bring her meter today but reported most readings < 200 and more readings < 150 than above 150.  She denies symptomatic hypoglycemia or having any readings < 130.   She is willing to continue with current treatment plan and check post-prandial CBGs to assess if prandial insulin will be needed.  Reviewed hypoglycemia management plan.    Review of Systems     Objective:   Physical Exam        Assessment & Plan:   Diabetes of several yrs duration currently under good and improved control of blood glucose based on home fasting CBG readings of 130-200.  Control is improved however patient would like to target weight goal of 145 lbs in the future.  Denies hypoglycemic events.  Following education, able to verbalize appropriate hypoglycemia management plan (information sheet provided). Continued basal insulin Lantus (insulin glargine) at dose of 25 units each AM. Asked to check both AM and one additioanl CBG daily (postprandials).  Written patient instructions provided.  Follow up in  Pharmacist Clinic Visit PRN after next visit with Dr. Mikel Cella..   Total time in face to face counseling 20 minutes.

## 2012-02-20 NOTE — Assessment & Plan Note (Signed)
Consider reassessment of current drug therapy plan for Hypertension at next visit.    She should be rehydrated and has been improved control of CBGs which should have decreased stress.

## 2012-02-20 NOTE — Assessment & Plan Note (Signed)
>>  ASSESSMENT AND PLAN FOR UNCONTROLLED TYPE 2 DIABETES MELLITUS WITH HYPERGLYCEMIA (HCC) WRITTEN ON 02/20/2012 12:03 PM BY KOVAL, PETER G, PHARMD  Diabetes of several yrs duration currently under good and improved control of blood glucose based on home fasting CBG readings of 130-200.  Control is improved however patient would like to target weight goal of 145 lbs in the future.  Denies hypoglycemic events.  Following education, able to verbalize appropriate hypoglycemia management plan (information sheet provided). Continued basal insulin Lantus (insulin glargine) at dose of 25 units each AM. Asked to check both AM and one additioanl CBG daily (postprandials).  Written patient instructions provided.  Follow up in  Pharmacist Clinic Visit PRN after next visit with Dr. Mikel Cella..   Total time in face to face counseling 20 minutes.

## 2012-02-23 ENCOUNTER — Other Ambulatory Visit: Payer: Self-pay | Admitting: Family Medicine

## 2012-03-19 ENCOUNTER — Encounter: Payer: Self-pay | Admitting: Family Medicine

## 2012-03-19 ENCOUNTER — Ambulatory Visit (INDEPENDENT_AMBULATORY_CARE_PROVIDER_SITE_OTHER): Payer: Medicaid Other | Admitting: Family Medicine

## 2012-03-19 VITALS — BP 157/88 | HR 74 | Ht 65.0 in | Wt 195.0 lb

## 2012-03-19 DIAGNOSIS — IMO0001 Reserved for inherently not codable concepts without codable children: Secondary | ICD-10-CM

## 2012-03-19 DIAGNOSIS — J069 Acute upper respiratory infection, unspecified: Secondary | ICD-10-CM

## 2012-03-19 DIAGNOSIS — M129 Arthropathy, unspecified: Secondary | ICD-10-CM

## 2012-03-19 DIAGNOSIS — I10 Essential (primary) hypertension: Secondary | ICD-10-CM

## 2012-03-19 LAB — POCT URINALYSIS DIPSTICK
Bilirubin, UA: NEGATIVE
Ketones, UA: NEGATIVE
Leukocytes, UA: NEGATIVE
Protein, UA: 30
Spec Grav, UA: 1.025

## 2012-03-19 LAB — POCT GLYCOSYLATED HEMOGLOBIN (HGB A1C): Hemoglobin A1C: 9.4

## 2012-03-19 MED ORDER — MELOXICAM 15 MG PO TABS: 15.0000 mg | ORAL_TABLET | Freq: Every day | ORAL | Status: DC

## 2012-03-19 MED ORDER — BENZONATATE 100 MG PO CAPS: 100.0000 mg | ORAL_CAPSULE | Freq: Three times a day (TID) | ORAL | Status: DC | PRN

## 2012-03-19 MED ORDER — ACCU-CHEK FASTCLIX LANCETS MISC: 1.0000 | Freq: Every day | Status: DC

## 2012-03-19 MED ORDER — GLUCOSE BLOOD VI STRP: ORAL_STRIP | Status: DC

## 2012-03-19 NOTE — Progress Notes (Signed)
Patient ID: MARALYN WITHERELL, female   DOB: 1955-02-27, 57 y.o.   MRN: [id number]  [name] Amber M. Hairford, MD Phone: [phone]   Subjective: HPI: Patient is a 57 y.o. female presenting to clinic today for follow up. Concerns today include URI  #1. Hypertension Blood pressure at home: Does not check at home Blood pressure today: 157/88  Taking Meds: Taking all home medications, as well as OTC decongestants Side effects: None ROS: Denies headache, visual changes, nausea, vomiting, chest pain, abdominal pain or shortness of breath. Not currently exercising because of the cold weather. Feels slightly more fatigued.   #2. Diabetes:  High at home: Thinks her meter is broken. Higest 180's, but did see 300 a few times but possibly due to meter dysfunction Low at home: 140. No symptomatic lows at home.  Taking medications: Taking medicine. Lantus 25units every morning Side effects: None ROS: denies fever, chills, dizziness, LOC, polyuria, polydipsia, numbness or tingling in extremities or chest pain.  #3. URI:  Developed nasal congestion, non-productive cough, sore throat for one week. Tried OTC medications which has helped much other than helping cough some. No fevers. Possible sick contacts.   #4. Osteoarthritis: Patient with known knee pain. She states it is not better or worse. Mildly interferes with her daily life but she is able to ambulate and do her normal activities. She does not tolerate Voltaren due to nausea, but is willing to try something else.   History Reviewed: Former smoker. Health Maintenance: Needs pap in August 2014. Mammogram due.  ROS: Please see HPI above.  Objective: Office vital signs reviewed. BP 157/88  Pulse 74  Ht 5\' 5"  (1.651 m)  Wt 195 lb (88.451 kg)  BMI 32.45 kg/m2  Physical Examination:  General: Awake, alert. NAD. Very pleasant HEENT: Atraumatic, normocephalic. No sinus tenderness. Some post-pharyngeal edema and  erythema. Neck: No masses palpated. No LAD Pulm: CTAB, no wheezes Cardio: RRR, no murmurs appreciated Abdomen:+BS, soft, nontender, nondistended Extremities: No edema. Foot exam unremarkable. No sores or decreased sensation Neuro: Grossly intact  Assessment: 57 y.o. female follow up appointment  Plan: See Problem List and After Visit Summary

## 2012-03-19 NOTE — Assessment & Plan Note (Signed)
Avoid OTC decongestants given her HTN. Take Coricidin as needed, and given Rx for Tessalon to take at night for cough. Continue symptomatic relief

## 2012-03-19 NOTE — Patient Instructions (Signed)
I have sent in a prescription for new test strips and lancets to go with your new meter.  For your knee pain, I have sent in a prescription for Mobic 15mg  to take daily as needed for pain.  For your cold, stop taking any over the counter decongestants. You can take Coricidin (which is behind the counter) as well as plain Robitussin as needed. I have sent in a prescription for Tessalon to take for your cough. You can use this at night especially.  Quientin Jent M. Kayela Humphres, M.D.

## 2012-03-19 NOTE — Assessment & Plan Note (Signed)
SBP elevated to 157 today but patient is taking OTC decongestants for cold. Encouraged exercise and take all medications as prescribed.

## 2012-03-19 NOTE — Assessment & Plan Note (Signed)
>>  ASSESSMENT AND PLAN FOR HYPERTENSION, BENIGN ESSENTIAL WRITTEN ON 03/19/2012 11:45 AM BY HAIRFORD, AMBER M, MD  SBP elevated to 157 today but patient is taking OTC decongestants for cold. Encouraged exercise and take all medications as prescribed.

## 2012-03-19 NOTE — Assessment & Plan Note (Signed)
A1C improved, but still not at goal. Given new meter in clinic today and Rx for strips/lancets. She should check her blood sugar before insulin, and also at some point later in the day if she can. Continue lantus as prescribed. Follow up with me in 3 months, and pharmacy clinic as needed. Continue all medications as prescribed.

## 2012-03-19 NOTE — Addendum Note (Signed)
Addended by: [place], Marisol Giambra on: 03/19/2012 02:54 PM   Modules accepted: Orders

## 2012-03-19 NOTE — Assessment & Plan Note (Signed)
>>  ASSESSMENT AND PLAN FOR UNCONTROLLED TYPE 2 DIABETES MELLITUS WITH HYPERGLYCEMIA (HCC) WRITTEN ON 03/19/2012 11:44 AM BY HAIRFORD, AMBER M, MD  A1C improved, but still not at goal. Given new meter in clinic today and Rx for strips/lancets. She should check her blood sugar before insulin, and also at some point later in the day if she can. Continue lantus as prescribed. Follow up with me in 3 months, and pharmacy clinic as needed. Continue all medications as prescribed.

## 2012-03-19 NOTE — Assessment & Plan Note (Signed)
Given Rx for Mobic. Since it is daily hopefully it will not cause nausea. Follow up as needed

## 2012-04-30 ENCOUNTER — Other Ambulatory Visit: Payer: Self-pay | Admitting: Family Medicine

## 2012-06-26 ENCOUNTER — Other Ambulatory Visit: Payer: Self-pay | Admitting: Family Medicine

## 2012-07-26 ENCOUNTER — Other Ambulatory Visit: Payer: Self-pay | Admitting: Family Medicine

## 2012-08-31 ENCOUNTER — Other Ambulatory Visit: Payer: Self-pay | Admitting: Family Medicine

## 2012-10-04 ENCOUNTER — Other Ambulatory Visit: Payer: Self-pay | Admitting: Family Medicine

## 2012-11-09 ENCOUNTER — Other Ambulatory Visit: Payer: Self-pay | Admitting: Family Medicine

## 2012-11-29 ENCOUNTER — Other Ambulatory Visit: Payer: Self-pay | Admitting: Family Medicine

## 2012-12-06 ENCOUNTER — Other Ambulatory Visit: Payer: Self-pay | Admitting: Family Medicine

## 2012-12-24 ENCOUNTER — Telehealth: Payer: Self-pay | Admitting: *Deleted

## 2012-12-24 DIAGNOSIS — E7800 Pure hypercholesterolemia, unspecified: Secondary | ICD-10-CM

## 2012-12-24 DIAGNOSIS — IMO0001 Reserved for inherently not codable concepts without codable children: Secondary | ICD-10-CM

## 2012-12-24 NOTE — Telephone Encounter (Signed)
Scheduled patient for a lab visit next Friday to check her LDL and A1C.Holly Acosta, [name]

## 2012-12-24 NOTE — Addendum Note (Signed)
Addended by: Jennette Bill on: 12/24/2012 12:11 PM   Modules accepted: Orders

## 2012-12-31 ENCOUNTER — Other Ambulatory Visit (INDEPENDENT_AMBULATORY_CARE_PROVIDER_SITE_OTHER): Payer: Medicaid Other

## 2012-12-31 DIAGNOSIS — E7800 Pure hypercholesterolemia, unspecified: Secondary | ICD-10-CM

## 2012-12-31 DIAGNOSIS — IMO0001 Reserved for inherently not codable concepts without codable children: Secondary | ICD-10-CM

## 2012-12-31 LAB — LDL CHOLESTEROL, DIRECT: Direct LDL: 135 mg/dL — ABNORMAL HIGH

## 2012-12-31 LAB — POCT GLYCOSYLATED HEMOGLOBIN (HGB A1C): Hemoglobin A1C: >14

## 2012-12-31 NOTE — Progress Notes (Unsigned)
D-LDL AND A1C DONE TODAY Holly Acosta 

## 2013-01-08 ENCOUNTER — Other Ambulatory Visit: Payer: Self-pay | Admitting: Family Medicine

## 2013-02-04 ENCOUNTER — Other Ambulatory Visit: Payer: Self-pay | Admitting: Family Medicine

## 2013-04-12 ENCOUNTER — Other Ambulatory Visit: Payer: Self-pay | Admitting: Family Medicine

## 2013-08-18 ENCOUNTER — Other Ambulatory Visit: Payer: Self-pay | Admitting: *Deleted

## 2013-09-01 ENCOUNTER — Ambulatory Visit (INDEPENDENT_AMBULATORY_CARE_PROVIDER_SITE_OTHER): Payer: Medicaid Other | Admitting: Family Medicine

## 2013-09-01 ENCOUNTER — Encounter: Payer: Self-pay | Admitting: Family Medicine

## 2013-09-01 VITALS — BP 176/78 | HR 84 | Ht 65.0 in | Wt 185.0 lb

## 2013-09-01 DIAGNOSIS — I10 Essential (primary) hypertension: Secondary | ICD-10-CM

## 2013-09-01 DIAGNOSIS — E7800 Pure hypercholesterolemia, unspecified: Secondary | ICD-10-CM

## 2013-09-01 DIAGNOSIS — E1165 Type 2 diabetes mellitus with hyperglycemia: Principal | ICD-10-CM

## 2013-09-01 DIAGNOSIS — IMO0001 Reserved for inherently not codable concepts without codable children: Secondary | ICD-10-CM

## 2013-09-01 DIAGNOSIS — E785 Hyperlipidemia, unspecified: Secondary | ICD-10-CM

## 2013-09-01 LAB — CBC
HCT: 44.1 % (ref 36.0–46.0)
Hemoglobin: 14.3 g/dL (ref 12.0–15.0)
MCH: 28.7 pg (ref 26.0–34.0)
MCHC: 32.4 g/dL (ref 30.0–36.0)
MCV: 88.6 fL (ref 78.0–100.0)
Platelets: 235 10*3/uL (ref 150–400)
RBC: 4.98 MIL/uL (ref 3.87–5.11)
RDW: 14.2 % (ref 11.5–15.5)
WBC: 6 10*3/uL (ref 4.0–10.5)

## 2013-09-01 LAB — POCT GLYCOSYLATED HEMOGLOBIN (HGB A1C): Hemoglobin A1C: >14

## 2013-09-01 MED ORDER — HYDROCHLOROTHIAZIDE 25 MG PO TABS: 25.0000 mg | ORAL_TABLET | Freq: Every day | ORAL | Status: DC

## 2013-09-01 MED ORDER — ATORVASTATIN CALCIUM 40 MG PO TABS: 40.0000 mg | ORAL_TABLET | Freq: Every day | ORAL | Status: DC

## 2013-09-01 MED ORDER — LISINOPRIL 40 MG PO TABS: 40.0000 mg | ORAL_TABLET | Freq: Every day | ORAL | Status: DC

## 2013-09-01 MED ORDER — AMLODIPINE BESYLATE 10 MG PO TABS: 10.0000 mg | ORAL_TABLET | Freq: Every day | ORAL | Status: DC

## 2013-09-01 NOTE — Assessment & Plan Note (Signed)
>>  ASSESSMENT AND PLAN FOR HYPERTENSION, BENIGN ESSENTIAL WRITTEN ON 09/01/2013 12:24 PM BY Jamal Collin, MD  Poor control - Reports being out of all blood her medications  X1 week; denies financial difficulties - Refill Lipitor, HCTZ, amlodipine; did not refill atenolol - Followup for hypertension in 2 weeks.  Will reassess additional medication needs at that time - Check BMP

## 2013-09-01 NOTE — Assessment & Plan Note (Signed)
Poor control - Reports being out of all blood her medications  X1 week; denies financial difficulties - Refill Lipitor, HCTZ, amlodipine; did not refill atenolol - Followup for hypertension in 2 weeks.  Will reassess additional medication needs at that time - Check BMP

## 2013-09-01 NOTE — Assessment & Plan Note (Signed)
Switched statin to Lipitor; she think she had muscle cramps with this in the past however she continues to have occasional muscle cramps

## 2013-09-01 NOTE — Patient Instructions (Addendum)
It was great seeing you today.   1. Your blood pressure and blood sugar are both high today. I have refilled your medications for both. Please take all medication as prescribed.  2. Check your blood sugar every morning when you wake up both eating breakfast 3. I have changed your cholesterol medication to Lipitor which you should take at night 4. Take baby aspirin 81 mg every day   Please bring all your medications to every doctors visit  Sign up for My Chart to have easy access to your labs results, and communication with your Primary care physician.  Next Appointment  Please make an appointment with Dr Berkley Harvey in 1-2 weeks   I look forward to talking with you again at our next visit. If you have any questions or concerns before then, please call the clinic at [phone].  Take Care,   Dr Phill Myron

## 2013-09-01 NOTE — Progress Notes (Signed)
  Patient name: Holly Acosta MRN [id number]  Date of birth: [birth year]  CC & HPI:  Holly Acosta is a 58 y.o. female presenting today for DM, HTN and HLD f/u.   DIABETES  Lab Results  Component Value Date   HGBA1C >14.0 09/01/2013    Blood Sugar Ranges: Not checking  Symptoms of Hypoglycemia? no  Comorbid Symptoms:  Denies Neuropathy; Reports blurry Vision   CHRONIC HYPERTENSION BP Readings from Last 3 Encounters:  09/01/13 176/78  03/19/12 157/88  02/20/12 148/85    Disease Monitoring  Blood pressure range outside clinc: not checking  Chest pain: no   Dyspnea: no   Claudication: no  Medication Side Effects: Not currently taking meds; reports being out for ~ 1 week   HLD Lab Results  Component Value Date   LDLCALC 136* 02/01/2010    Medication Compliance: yes  Side Effects?: No muscle pain or weakness, RUQ pain; jaundice; says she had muscle cramps with Lipitor in the past  Medication compliance: No financial difficulties: no   Preventitive Healthcare:  Smoking: Quit    ROS: See HPI    Objective Findings:  Vitals: BP 176/78  Pulse 84  Ht 5\' 5"  (1.651 m)  Wt 185 lb (83.915 kg)  BMI 30.79 kg/m2  Gen: NAD CV: RRR w/o m/r/g, pulses +2 b/l Resp: CTAB w/ normal respiratory effort LE: NO edema  Assessment & Plan:   Please See Problem Focused Assessment & Plan

## 2013-09-01 NOTE — Assessment & Plan Note (Signed)
>>  ASSESSMENT AND PLAN FOR UNCONTROLLED TYPE 2 DIABETES MELLITUS WITH HYPERGLYCEMIA (HCC) WRITTEN ON 09/01/2013 12:20 PM BY Jamal Collin, MD  Poor control; Reports only taking Lantus 1-2 times a week; Not check BG at home but reports has working meter  - Encouraged compliance with Lantus as well as by mouth medications - As patient to check a.m. blood sugars and followup in 2 weeks - Eye Exam and foot exam: past due will discuss next visit

## 2013-09-01 NOTE — Assessment & Plan Note (Signed)
Poor control; Reports only taking Lantus 1-2 times a week; Not check BG at home but reports has working meter  - Encouraged compliance with Lantus as well as by mouth medications - As patient to check a.m. blood sugars and followup in 2 weeks - Eye Exam and foot exam: past due will discuss next visit

## 2013-09-02 LAB — BASIC METABOLIC PANEL
BUN: 11 mg/dL (ref 6–23)
CO2: 25 meq/L (ref 19–32)
Calcium: 9.7 mg/dL (ref 8.4–10.5)
Chloride: 98 mEq/L (ref 96–112)
Creat: 0.87 mg/dL (ref 0.50–1.10)
Glucose, Bld: 408 mg/dL — ABNORMAL HIGH (ref 70–99)
Potassium: 4.5 mEq/L (ref 3.5–5.3)
Sodium: 135 mEq/L (ref 135–145)

## 2013-09-03 ENCOUNTER — Other Ambulatory Visit: Payer: Self-pay | Admitting: Family Medicine

## 2013-09-08 ENCOUNTER — Telehealth: Payer: Self-pay | Admitting: Family Medicine

## 2013-09-08 NOTE — Telephone Encounter (Signed)
Pt was placed on Lipitor on 8/13 by Dr. Berkley Harvey. Since starting the medication, she has been experiencing cramps in legs and hands. Would like to know should she continue taking? Please call, ok to LVM [phone].

## 2013-09-08 NOTE — Telephone Encounter (Signed)
LMOVM informing pt to stop lipitor but continue other meds and that we will see her on 09/12/13.  Confirmed with Dr. Berkley Harvey. [place]eger, Salome Spotted

## 2013-09-12 ENCOUNTER — Encounter: Payer: Self-pay | Admitting: Family Medicine

## 2013-09-12 ENCOUNTER — Ambulatory Visit (INDEPENDENT_AMBULATORY_CARE_PROVIDER_SITE_OTHER): Payer: Medicaid Other | Admitting: Family Medicine

## 2013-09-12 VITALS — BP 159/91 | HR 73 | Temp 98.0°F | Wt 187.0 lb

## 2013-09-12 DIAGNOSIS — IMO0001 Reserved for inherently not codable concepts without codable children: Secondary | ICD-10-CM

## 2013-09-12 DIAGNOSIS — G4730 Sleep apnea, unspecified: Secondary | ICD-10-CM

## 2013-09-12 DIAGNOSIS — E7800 Pure hypercholesterolemia, unspecified: Secondary | ICD-10-CM

## 2013-09-12 DIAGNOSIS — I10 Essential (primary) hypertension: Secondary | ICD-10-CM

## 2013-09-12 DIAGNOSIS — E1165 Type 2 diabetes mellitus with hyperglycemia: Secondary | ICD-10-CM

## 2013-09-12 MED ORDER — ACCU-CHEK FASTCLIX LANCETS MISC: 1.0000 | Freq: Every day | Status: DC

## 2013-09-12 MED ORDER — ACCU-CHEK AVIVA CONNECT W/DEVICE KIT: 1.0000 | PACK | Status: DC | PRN

## 2013-09-12 MED ORDER — CARVEDILOL 12.5 MG PO TABS: 12.5000 mg | ORAL_TABLET | Freq: Two times a day (BID) | ORAL | Status: DC

## 2013-09-12 MED ORDER — GLUCOSE BLOOD VI STRP: ORAL_STRIP | Status: DC

## 2013-09-12 MED ORDER — PRAVASTATIN SODIUM 40 MG PO TABS: 20.0000 mg | ORAL_TABLET | Freq: Every day | ORAL | Status: DC

## 2013-09-12 NOTE — Assessment & Plan Note (Signed)
Not using CPAP - Advised restarting given persistent HTN on 3 BP meds

## 2013-09-12 NOTE — Assessment & Plan Note (Signed)
Discontinued Lipitor due to increased cramping in feet - restarting Pravastatin @ 20 MG qhs - Will increase to 80 mg as tolerated

## 2013-09-12 NOTE — Progress Notes (Signed)
  Patient name: [name] MRN [id number]  Date of birth: [birth year]  CC & HPI:  Holly Acosta is a 58 y.o. female presenting today for f/u on HTN, DM, and HLD.   DIABETES  Lab Results  Component Value Date   HGBA1C >14.0 09/01/2013    Blood Sugar Ranges: Not check due to meter not working  Symptoms of Hypoglycemia? no  Comorbid Symptoms:  no Neuropathy: no Vision problems  Medication Side Effects: denies N/V/D  CHRONIC HYPERTENSION BP Readings from Last 3 Encounters:  09/12/13 159/91  09/01/13 176/78  03/19/12 157/88    Disease Monitoring: Has OSA and not using CPAP machine  Blood pressure range outside clinc: not checking  Chest pain: no   Dyspnea: no   Claudication: no  Medication Side Effects: Denies cough, ightheadedness, rash)   On CC blocker and has worsening LE edema that comes and goes  HLD Lab Results  Component Value Date   LDLCALC 136* 02/01/2010    Medication Compliance: Stopped taking due to worsening cramps in her feet  Side Effects?: yes- muscle pain as above  Medication compliance yes   Preventitive Healthcare:  Smoking: Continues   Diet: Working on cutting back on salt  Exercise: exercise bike 5- 10 mins a few times a week; working on increasing    ROS: See HPI   Medical & Surgical Hx:  Reviewed  Medications & Allergies: Reviewed  Social History: Reviewed:   Objective Findings:  Vitals: BP 159/91  Pulse 73  Temp(Src) 98 F (36.7 C) (Oral)  Wt 187 lb (84.823 kg)  Gen: NAD CV: RRR w/o m/r/g, pulses +2 b/l Resp: CTAB w/ normal respiratory effort LE: 1+ swelling b/l   Assessment & Plan:   Please See Problem Focused Assessment & Plan

## 2013-09-12 NOTE — Assessment & Plan Note (Signed)
>>  ASSESSMENT AND PLAN FOR UNCONTROLLED TYPE 2 DIABETES MELLITUS WITH HYPERGLYCEMIA (HCC) WRITTEN ON 09/12/2013 12:18 PM BY Jamal Collin, MD  Report compliance with Glipizide, Metformin and Lantus 25u qd; Meter not working - Rx for new meter - f/u in 2 weeks at nurse visit for HTN to report BGs - F/u with PCP in 4 weeks

## 2013-09-12 NOTE — Assessment & Plan Note (Signed)
Report compliance with Glipizide, Metformin and Lantus 25u qd; Meter not working - Rx for new meter - f/u in 2 weeks at nurse visit for HTN to report BGs - F/u with PCP in 4 weeks

## 2013-09-12 NOTE — Assessment & Plan Note (Signed)
>>  ASSESSMENT AND PLAN FOR HYPERTENSION, BENIGN ESSENTIAL WRITTEN ON 09/12/2013 12:17 PM BY Jamal Collin, MD  She report compliance with Lisinopril, HCTZ and Norvasc since last visit - Not using CPAP machine - Some mild worsening of LE swelling - ECHO 2012 but unable to view results  Plan - Start Coreg 6.25 mg BID; Nurse visit in 2 weeks for BP check - Will increase to 12.5 BID if still elevated at that time - Advised restarting CPAP; If BP remains elevated after restarting may need to evaluate for secondary causes of HTN  - Advised low salt diet and encouraged increasing Exercise to 30 mins daily - consider starting Spironolactone or Repeating ECHO - Encouraged smoking Cessation

## 2013-09-12 NOTE — Assessment & Plan Note (Signed)
She report compliance with Lisinopril, HCTZ and Norvasc since last visit - Not using CPAP machine - Some mild worsening of LE swelling - ECHO 2012 but unable to view results  Plan - Start Coreg 6.25 mg BID; Nurse visit in 2 weeks for BP check - Will increase to 12.5 BID if still elevated at that time - Advised restarting CPAP; If BP remains elevated after restarting may need to evaluate for secondary causes of HTN  - Advised low salt diet and encouraged increasing Exercise to 30 mins daily - consider starting Spironolactone or Repeating ECHO - Encouraged smoking Cessation

## 2013-09-12 NOTE — Patient Instructions (Signed)
It was great seeing you today.   1. I've sent in a prescription for new meter and strips. Please check your blood sugar every morning and bring in your meter and reading in 1 month 2. I'm starting Coreg for your high blood pressure. Please take 1/2 pill (6.25 mg) for two weeks and come back for nurse visit    Please bring all your medications to every doctors visit  Sign up for My Chart to have easy access to your labs results, and communication with your Primary care physician.  Next Appointment  Need nurse visit for BP check in 2 weeks  Please make an appointment with Dr Berkley Harvey in 1 month   I look forward to talking with you again at our next visit. If you have any questions or concerns before then, please call the clinic at [phone].  Take Care,   Dr Phill Myron

## 2013-09-22 ENCOUNTER — Other Ambulatory Visit: Payer: Self-pay | Admitting: *Deleted

## 2013-09-22 MED ORDER — METFORMIN HCL 1000 MG PO TABS: 1000.0000 mg | ORAL_TABLET | Freq: Two times a day (BID) | ORAL | Status: DC

## 2013-09-29 ENCOUNTER — Ambulatory Visit (INDEPENDENT_AMBULATORY_CARE_PROVIDER_SITE_OTHER): Payer: Medicaid Other | Admitting: *Deleted

## 2013-09-29 VITALS — BP 140/80 | HR 70

## 2013-09-29 DIAGNOSIS — Z136 Encounter for screening for cardiovascular disorders: Secondary | ICD-10-CM

## 2013-09-29 DIAGNOSIS — Z0130 Encounter for examination of blood pressure without abnormal findings: Secondary | ICD-10-CM

## 2013-09-29 NOTE — Progress Notes (Signed)
   Pt in nurse clinic for blood pressure check.  BP 140/80, heart rate 70.  Pt denies any chest pain, SOB, headache or visual changes.  Pt stated she is taking her medication as prescribed.  Derl Barrow, RN

## 2013-10-06 ENCOUNTER — Encounter: Payer: Self-pay | Admitting: Family Medicine

## 2013-10-06 ENCOUNTER — Telehealth: Payer: Self-pay | Admitting: Home Health Services

## 2013-10-06 ENCOUNTER — Ambulatory Visit (INDEPENDENT_AMBULATORY_CARE_PROVIDER_SITE_OTHER): Payer: Medicaid Other | Admitting: Family Medicine

## 2013-10-06 VITALS — BP 130/80 | HR 60 | Temp 98.0°F | Ht 65.0 in | Wt 183.0 lb

## 2013-10-06 DIAGNOSIS — E7800 Pure hypercholesterolemia, unspecified: Secondary | ICD-10-CM

## 2013-10-06 DIAGNOSIS — M129 Arthropathy, unspecified: Secondary | ICD-10-CM

## 2013-10-06 DIAGNOSIS — Z23 Encounter for immunization: Secondary | ICD-10-CM

## 2013-10-06 DIAGNOSIS — E119 Type 2 diabetes mellitus without complications: Secondary | ICD-10-CM

## 2013-10-06 DIAGNOSIS — G4730 Sleep apnea, unspecified: Secondary | ICD-10-CM

## 2013-10-06 DIAGNOSIS — E1165 Type 2 diabetes mellitus with hyperglycemia: Secondary | ICD-10-CM

## 2013-10-06 DIAGNOSIS — IMO0001 Reserved for inherently not codable concepts without codable children: Secondary | ICD-10-CM

## 2013-10-06 DIAGNOSIS — I10 Essential (primary) hypertension: Secondary | ICD-10-CM

## 2013-10-06 LAB — HM DIABETES EYE EXAM

## 2013-10-06 MED ORDER — INSULIN GLARGINE 100 UNIT/ML SOLOSTAR PEN: PEN_INJECTOR | SUBCUTANEOUS | Status: DC

## 2013-10-06 MED ORDER — PRAVASTATIN SODIUM 40 MG PO TABS: 40.0000 mg | ORAL_TABLET | Freq: Every day | ORAL | Status: DC

## 2013-10-06 NOTE — Assessment & Plan Note (Signed)
She was able to use her CPAP machine for several nights and reported that her sleep was better; her she has not used for several days now due to broken hose - she reports having # [facility] home [facility], and will contact him regarding CPAP machine assessment/repair

## 2013-10-06 NOTE — Patient Instructions (Signed)
It was great seeing you today.   1. Increase Pravastatin to 1 pill every night (40mg ) 2. Increase your Lantus by 2 units every second day until your morning blood sugars are under 120. Call me when your morning blood sugar is staying under 120 or your get to 50 units of lantus 3. Call about CPAP and see if you can get it fixed 4. Your Blood pressure is excellent - continue taking your current medications   Please bring all your medications to every doctors visit  Sign up for My Chart to have easy access to your labs results, and communication with your Primary care physician.  Next Appointment  Please make an appointment with Dr Berkley Harvey in 2 months to discuss your Diabetes  Make an appointment to see me about your knees at your convenience   I look forward to talking with you again at our next visit. If you have any questions or concerns before then, please call the clinic at [phone].  Take Care,   Dr Phill Myron

## 2013-10-06 NOTE — Assessment & Plan Note (Signed)
Patient is tolerating Pravachol without for cramps previously experienced with Lipitor - Increase to 40 mg each bedtime

## 2013-10-06 NOTE — Assessment & Plan Note (Signed)
Patient continues to complain of bilateral knee pain preventing her from exercising - Deferred evaluation -this visit - asked patient to make followup visit within the next month to assess and discuss treatment options

## 2013-10-06 NOTE — Progress Notes (Signed)
  Patient name: Holly Acosta MRN [id number]  Date of birth: 1955-04-12  CC & HPI:  Holly Acosta is a 58 y.o. female presenting today for DM, OSA, HLD, and HTN  DIABETES   Blood Sugar Ranges: 176 - 225  Symptoms of Hypoglycemia? no  Comorbid Symptoms: No Chest pain; SOB;  Neuropathy; Vision problems  Medication Compliance: yes   Medication Side Effects: None  Counseling  Diet pattern: Working on cutting back on breads  Exercise: biking 5 mins  Smoking status: quit    Preventative Health Care  Eye Exam: out of date  Vaccinations reveiwed:    OSA - Used CPAP several times but hose is broken  CHRONIC HYPERTENSION  BP Readings from Last 3 Encounters:  10/06/13 130/80  09/29/13 140/80  09/12/13 159/91    Control: well controlled Disease Monitoring  Chest pain: no   Dyspnea: no   Claudication: no  Medication compliance/financial difficulties: no  Medication Side Effects:Some fatigue since starting [place]:   History  Smoking status  . Former Smoker  Smokeless tobacco  . Former Systems developer  . Quit date: 11/04/2006   Hyperlipidemia  Medication Compliance: yes  Side Effects?: no muscle pain or weakness, RUQ pain; jaundice    ROS: See HPI   Medical & Surgical Hx:  Reviewed  Medications & Allergies: Reviewed  Social History: Reviewed:   Objective Findings:  Vitals: BP 130/80  Pulse 60  Temp(Src) 98 F (36.7 C) (Oral)  Ht 5\' 5"  (1.651 m)  Wt 183 lb (83.008 kg)  BMI 30.45 kg/m2  Gen: NAD CV: RRR w/o m/r/g, pulses +2 b/l Resp: CTAB w/ normal respiratory effort LE: 1+ edema b/l  Assessment & Plan:   Please See Problem Focused Assessment & Plan

## 2013-10-06 NOTE — Assessment & Plan Note (Signed)
>>  ASSESSMENT AND PLAN FOR HYPERTENSION, BENIGN ESSENTIAL WRITTEN ON 10/06/2013 12:30 PM BY Jamal Collin, MD  Well controlled today continue current therapy

## 2013-10-06 NOTE — Telephone Encounter (Signed)
[facility] pharmacist [facility] contacted patient to discuss not recently filling his pravastatin medication.  1. Decreased pravastatin adherence.  Patient was not aware of the change in dose from 40-80 mg.  Never picked up 80 mg Rx  Last filled 40 mg on 08-13-13 #30  2. Would like Rx sent to [facility] for DM testing supplies.  Freestyle lite meter, Freestyle lite testing strips, and Freestyle lancets would be best financial option for him.  Message was sent to PCP nurse to sent RX.  3. Pt is looking great with lisinopril compliance.

## 2013-10-06 NOTE — Assessment & Plan Note (Signed)
>>  ASSESSMENT AND PLAN FOR UNCONTROLLED TYPE 2 DIABETES MELLITUS WITH HYPERGLYCEMIA (HCC) WRITTEN ON 10/06/2013 12:37 PM BY Jamal Collin, MD  BGs remain elevated with compliance with Metformin and Glipizide + Lantus 25 units - Will increase Lantus 2 units every other day until morning BG is < 120 or Lantus dose reach 50 units - follow up in 2 months recheck A1c at that time - If still poorly controlled-  Will refer to pharmacy clinic and consider short acting insulin - eye exam completed today

## 2013-10-06 NOTE — Assessment & Plan Note (Signed)
Well controlled today continue current therapy

## 2013-10-06 NOTE — Assessment & Plan Note (Addendum)
BGs remain elevated with compliance with Metformin and Glipizide + Lantus 25 units - Will increase Lantus 2 units every other day until morning BG is < 120 or Lantus dose reach 50 units - follow up in 2 months recheck A1c at that time - If still poorly controlled-  Will refer to pharmacy clinic and consider short acting insulin - eye exam completed today

## 2013-10-10 ENCOUNTER — Other Ambulatory Visit: Payer: Self-pay | Admitting: Family Medicine

## 2013-10-26 ENCOUNTER — Ambulatory Visit (INDEPENDENT_AMBULATORY_CARE_PROVIDER_SITE_OTHER): Payer: Medicaid Other | Admitting: Family Medicine

## 2013-10-26 ENCOUNTER — Encounter: Payer: Self-pay | Admitting: Family Medicine

## 2013-10-26 VITALS — BP 143/83 | HR 73 | Temp 98.2°F | Wt 185.0 lb

## 2013-10-26 DIAGNOSIS — M25561 Pain in right knee: Secondary | ICD-10-CM

## 2013-10-26 MED ORDER — METHYLPREDNISOLONE ACETATE 40 MG/ML IJ SUSP
40.0000 mg | Freq: Once | INTRAMUSCULAR | Status: AC
  Administered 2013-10-26: 40 mg via INTRA_ARTICULAR
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
## 2013-10-26 NOTE — Patient Instructions (Signed)
It was great seeing you today.   1. Ice your knee when you get home and before going to bed 2. Use Aleve twice a day for the next three days 3. Call if you have knee swelling, redness or fevers   Please bring all your medications to every doctors visit  Sign up for My Chart to have easy access to your labs results, and communication with your Primary care physician.  Next Appointment  Please make an appointment with Dr Berkley Harvey in 1-2 months   I look forward to talking with you again at our next visit. If you have any questions or concerns before then, please call the clinic at [phone].  Take Care,   Dr Phill Myron

## 2013-11-21 ENCOUNTER — Encounter: Payer: Self-pay | Admitting: Family Medicine

## 2013-12-21 ENCOUNTER — Other Ambulatory Visit: Payer: Self-pay | Admitting: Family Medicine

## 2013-12-29 ENCOUNTER — Other Ambulatory Visit: Payer: Self-pay | Admitting: Family Medicine

## 2014-01-05 ENCOUNTER — Other Ambulatory Visit: Payer: Self-pay | Admitting: Family Medicine

## 2014-01-05 DIAGNOSIS — IMO0002 Reserved for concepts with insufficient information to code with codable children: Secondary | ICD-10-CM

## 2014-01-05 DIAGNOSIS — E1165 Type 2 diabetes mellitus with hyperglycemia: Secondary | ICD-10-CM

## 2014-01-05 DIAGNOSIS — E118 Type 2 diabetes mellitus with unspecified complications: Principal | ICD-10-CM

## 2014-01-06 ENCOUNTER — Ambulatory Visit: Payer: Medicaid Other | Admitting: Family Medicine

## 2014-02-07 ENCOUNTER — Encounter: Payer: Self-pay | Admitting: Family Medicine

## 2014-02-10 ENCOUNTER — Ambulatory Visit: Payer: Medicaid Other | Admitting: Family Medicine

## 2014-02-19 ENCOUNTER — Other Ambulatory Visit: Payer: Self-pay | Admitting: Family Medicine

## 2014-02-19 DIAGNOSIS — IMO0002 Reserved for concepts with insufficient information to code with codable children: Secondary | ICD-10-CM

## 2014-02-19 DIAGNOSIS — E1165 Type 2 diabetes mellitus with hyperglycemia: Secondary | ICD-10-CM

## 2014-02-20 ENCOUNTER — Emergency Department (HOSPITAL_COMMUNITY): Payer: Medicaid Other

## 2014-02-20 ENCOUNTER — Emergency Department (HOSPITAL_COMMUNITY)
Admission: EM | Admit: 2014-02-20 | Discharge: 2014-02-20 | Disposition: A | Discharge status: Home / Self Care | Payer: Medicaid Other | Attending: Emergency Medicine | Admitting: Emergency Medicine

## 2014-02-20 ENCOUNTER — Encounter (HOSPITAL_COMMUNITY): Payer: Self-pay | Admitting: *Deleted

## 2014-02-20 ENCOUNTER — Encounter: Payer: Self-pay | Admitting: Family Medicine

## 2014-02-20 DIAGNOSIS — E119 Type 2 diabetes mellitus without complications: Secondary | ICD-10-CM | POA: Insufficient documentation

## 2014-02-20 DIAGNOSIS — Z87891 Personal history of nicotine dependence: Secondary | ICD-10-CM | POA: Insufficient documentation

## 2014-02-20 DIAGNOSIS — Z8739 Personal history of other diseases of the musculoskeletal system and connective tissue: Secondary | ICD-10-CM | POA: Diagnosis not present

## 2014-02-20 DIAGNOSIS — Z9889 Other specified postprocedural states: Secondary | ICD-10-CM | POA: Insufficient documentation

## 2014-02-20 DIAGNOSIS — Z8669 Personal history of other diseases of the nervous system and sense organs: Secondary | ICD-10-CM | POA: Insufficient documentation

## 2014-02-20 DIAGNOSIS — I10 Essential (primary) hypertension: Secondary | ICD-10-CM | POA: Insufficient documentation

## 2014-02-20 DIAGNOSIS — Z79899 Other long term (current) drug therapy: Secondary | ICD-10-CM | POA: Insufficient documentation

## 2014-02-20 DIAGNOSIS — Z8742 Personal history of other diseases of the female genital tract: Secondary | ICD-10-CM | POA: Diagnosis not present

## 2014-02-20 DIAGNOSIS — M25552 Pain in left hip: Secondary | ICD-10-CM | POA: Insufficient documentation

## 2014-02-20 DIAGNOSIS — E785 Hyperlipidemia, unspecified: Secondary | ICD-10-CM | POA: Diagnosis not present

## 2014-02-20 MED ORDER — HYDROCODONE-ACETAMINOPHEN 5-325 MG PO TABS: ORAL_TABLET | ORAL | Status: DC

## 2014-02-20 NOTE — Discharge Instructions (Signed)
Take vicodin for breakthrough pain, do not drink alcohol, drive, care for children or do other critical tasks while taking vicodin.  Please be very careful not to fall! The pain medication and crutches puts you at risk for falls. Please rest as much as possible and try to not stay alone.   Please follow with your primary care doctor in the next 2 days for a check-up. They must obtain records for further management.   Do not hesitate to return to the Emergency Department for any new, worsening or concerning symptoms.

## 2014-02-20 NOTE — ED Notes (Signed)
Pt in c/o pain to her left hip that radiates down her whole leg, worse in knee and hip, history of arthritis, denies injury, pain is worse with movement

## 2014-02-20 NOTE — ED Provider Notes (Signed)
CSN: [id number]     Arrival date & time 02/20/14  2019 History  This chart was scribed for non-physician practitioner, Monico Blitz, PA-C working with Malvin Johns, MD by Frederich Balding, ED scribe. This patient was seen in room TR11C/TR11C and the patient's care was started at 10:10 PM.    Chief Complaint  Patient presents with  . Hip Pain   The history is provided by the patient. No language interpreter was used.    HPI Comments: Holly Acosta is a 59 y.o. female with history of knee arthritis who presents to the Emergency Department complaining of left hip pan that radiates into her buttock and leg that started one week ago. Rates pain 10/10. Denies injury but reports history of the same. Movement worsens pain. She has taken aleve with no relief. Pt does not normally walk with a cane but recently started using one due to pain. Denies lower back pain.   Past Medical History  Diagnosis Date  . Diabetes mellitus, type 2   . HTN (hypertension)   . HLD (hyperlipidemia)   . Sleep apnea   . Prolapsed uterus   . Arthritis of knee    Past Surgical History  Procedure Laterality Date  . Cardiac catheterization      2011   Family History  Problem Relation Age of Onset  . Diabetes Mother   . Hypertension Mother   . Hypertension Father    History  Substance Use Topics  . Smoking status: Former Research scientist (life sciences)  . Smokeless tobacco: Former Systems developer    Quit date: 11/04/2006  . Alcohol Use: No   OB History    Obstetric Comments   3 pregnancies - All NSVD     Review of Systems  A complete 10 system review of systems was obtained and all systems are negative except as noted in the HPI and PMH.   Allergies  Lipitor and Diclofenac  Home Medications   Prior to Admission medications   Medication Sig Start Date End Date Taking? Authorizing Provider  ACCU-CHEK FASTCLIX LANCETS MISC 1 each by Does not apply route daily. Check sugar daily as needed 09/12/13   Olam Idler, MD  amLODipine  (NORVASC) 10 MG tablet Take 1 tablet (10 mg total) by mouth daily. 09/01/13   Olam Idler, MD  B-D UF III MINI PEN NEEDLES 31G X 5 MM MISC USE ONCE DAILY WITH LANTUS 01/06/14   Olam Idler, MD  Blood Glucose Monitoring Suppl (ACCU-CHEK AVIVA CONNECT) W/DEVICE KIT 1 Device by Does not apply route as needed. 09/12/13   Olam Idler, MD  carvedilol (COREG) 12.5 MG tablet TAKE 1 TABLET BY MOUTH TWICE A DAY WITH MEALS 12/26/13   Olam Idler, MD  fish oil-omega-3 fatty acids 1000 MG capsule Take 2 g by mouth daily.    Historical Provider, MD  glipiZIDE (GLUCOTROL) 10 MG tablet TAKE 1 TABLET BY MOUTH EVERY DAY 04/12/13   Montez Morita, MD  glucose blood (ACCU-CHEK SMARTVIEW) test strip Daily as needed 09/12/13   Olam Idler, MD  hydrochlorothiazide (HYDRODIURIL) 25 MG tablet Take 1 tablet (25 mg total) by mouth daily. 09/01/13   Olam Idler, MD  HYDROcodone-acetaminophen (NORCO/VICODIN) 5-325 MG per tablet Take 1-2 tablets by mouth every 6 hours as needed for pain and/or cough. 02/20/14   Lowen Mansouri, PA-C  LANTUS SOLOSTAR 100 UNIT/ML Solostar Pen INCREASE YOUR LANTUS DOSE BY 2 UNITS EVERY SECOND DAY UNITL YOUR MORNING BLOOD SUGAR ARE BELOW 120  02/20/14   Olam Idler, MD  lisinopril (PRINIVIL,ZESTRIL) 40 MG tablet Take 1 tablet (40 mg total) by mouth daily. 09/01/13   Olam Idler, MD  metFORMIN (GLUCOPHAGE) 1000 MG tablet TAKE 1 TABLET BY MOUTH TWICE DAILY WITH A MEAL 12/30/13   Olam Idler, MD  pravastatin (PRAVACHOL) 40 MG tablet Take 1 tablet (40 mg total) by mouth daily. 10/06/13   Olam Idler, MD   BP 136/73 mmHg  Pulse 71  Temp(Src) 97.8 F (36.6 C) (Oral)  Resp 16  SpO2 93%   Physical Exam  Constitutional: She is oriented to person, place, and time. She appears well-developed and well-nourished. No distress.  HENT:  Head: Normocephalic.  Eyes: Conjunctivae and EOM are normal.  Cardiovascular: Normal rate.   Pulmonary/Chest: Effort normal. No stridor.   Musculoskeletal: Normal range of motion.  Good ROM in left hip flexion. No tenderness to palpation over greater trochanter. Straight leg raises positive on left side and negative on right side. Neurovascularly intact.   Neurological: She is alert and oriented to person, place, and time.  Psychiatric: She has a normal mood and affect.  Nursing note and vitals reviewed.   ED Course  Procedures (including critical care time)  DIAGNOSTIC STUDIES: Oxygen Saturation is 100% on RA, normal by my interpretation.    COORDINATION OF CARE: 10:12 PM-Discussed treatment plan which includes a short course of narcotic pain medication with pt at bedside and pt agreed to plan. Will give pt an orthopedic referral and advised her to follow up.   Labs Review Labs Reviewed - No data to display  Imaging Review Dg Hip Unilat With Pelvis 2-3 Views Left  02/20/2014   CLINICAL DATA:  Left side hip pain since yesterday. No known injury.  EXAM: LEFT HIP (WITH PELVIS) 2-3 VIEWS  COMPARISON:  None.  FINDINGS: Degenerative changes in the hips bilaterally with joint space narrowing and spurring, left greater than right. SI joints are symmetric and unremarkable. No acute bony abnormality. Specifically, no fracture, subluxation, or dislocation. Soft tissues are intact.  IMPRESSION: No acute bony abnormality.   Electronically Signed   By: Rolm Baptise M.D.   On: 02/20/2014 21:27     EKG Interpretation None      MDM   Final diagnoses:  Hip pain, acute, left    Filed Vitals:   02/20/14 2024 02/20/14 2219  BP: 162/87 136/73  Pulse: 64 71  Temp:  97.8 F (36.6 C)  TempSrc: Oral Oral  Resp: 20 16  SpO2: 100% 93%    Medications - No data to display  Holly Acosta is a pleasant 59 y.o. female presenting with atraumatic left hip pain onset 1 week ago. Patient has good range of motion, x-rays without signs of fracture, joint space narrowing or AVN. She is now ambulating with a cane, this is not typical for her.  I think he would benefit this patient to see an orthopedist. I've advised her to please let her primary care physician know that this is a new issue for her and given her a referral to Dr. Marlou Sa.  Evaluation does not show pathology that would require ongoing emergent intervention or inpatient treatment. Pt is hemodynamically stable and mentating appropriately. Discussed findings and plan with patient/guardian, who agrees with care plan. All questions answered. Return precautions discussed and outpatient follow up given.   Discharge Medication List as of 02/20/2014 10:16 PM    START taking these medications   Details  HYDROcodone-acetaminophen (NORCO/VICODIN) 5-325  MG per tablet Take 1-2 tablets by mouth every 6 hours as needed for pain and/or cough., Print         I personally performed the services described in this documentation, which was scribed in my presence. The recorded information has been reviewed and is accurate.  Monico Blitz, PA-C 02/21/14 1649  Malvin Johns, MD 02/24/14 [phone]

## 2014-02-20 NOTE — Telephone Encounter (Signed)
Please call. She needs PCP apt ASAP to discuss diabetes and recheck A1c. I've refilled her Lantus once until she can be scheduled.

## 2014-02-20 NOTE — Telephone Encounter (Signed)
appt 03/16/14. Holly Acosta,CMA

## 2014-02-23 ENCOUNTER — Ambulatory Visit (INDEPENDENT_AMBULATORY_CARE_PROVIDER_SITE_OTHER): Payer: Medicaid Other | Admitting: Family Medicine

## 2014-02-23 ENCOUNTER — Encounter: Payer: Self-pay | Admitting: Family Medicine

## 2014-02-23 VITALS — BP 133/82 | HR 79 | Temp 97.9°F | Ht 65.0 in | Wt 181.0 lb

## 2014-02-23 DIAGNOSIS — M25552 Pain in left hip: Secondary | ICD-10-CM | POA: Insufficient documentation

## 2014-02-23 MED ORDER — HYDROCODONE-ACETAMINOPHEN 5-325 MG PO TABS: 1.0000 | ORAL_TABLET | Freq: Four times a day (QID) | ORAL | Status: DC | PRN

## 2014-02-23 NOTE — Progress Notes (Addendum)
Patient ID: Holly Acosta, female   DOB: [birth year], 59 y.o.   MRN: [id number]   HPI  Patient presents today for one week of left hip pain.  Patient explains that the hip pain started in her left hip without inciting event or traumatic event. She describes it as a left lateral hip pain that radiates down the lateral side of her left leg to her knee and around her patella. She did have some intermittent left back pain which has largely resolved. The pain is worse with movement or walking.  She states that she's having a hard time walking because of the pain. She is also has a very hard time putting on her socks and getting dressed because of the pain. She denies fevers, chills, change in appetite, erythema or warmth at the site.  He was seen in the ER 3 days ago and given hydrocodone which has provided her with good relief. She has made an appointment with orthopedic surgery for 7 days from now and requests a small amount of narcotic pain medicine to help her make it to the appointment.  Smoking status noted ROS: Per HPI  Objective: BP 133/82 mmHg  Pulse 79  Temp(Src) 97.9 F (36.6 C) (Oral)  Ht 5\' 5"  (1.651 m)  Wt 181 lb (82.101 kg)  BMI 30.12 kg/m2 Gen: NAD, alert, cooperative with exam HEENT: NCAT CV: RRR, good S1/S2, no murmur Resp: CTABL, no wheezes, non-labored Ext: No edema, warm Neuro: Alert and oriented, No gross deficits  Musculoskeletal: Left hip with slight tenderness to palpation of above the greater trochanter around the lateral edge of the iliac crest, she does have tenderness in the medial thigh with logroll, unable to flex or extend leg due to pain.  Assessment and plan:  Left hip pain Severe left hip pain now for 1 week without inciting event Exam patient is unable to lift left hip and is having a hard time walking because of it, she does have pain with logroll test She has an appointment with orthopedic surgery in 7 days, she was given 7 hydrocodone in the ER  which lasted for 2 days. We'll give her another short course of hydrocodone, 5 mg-#30, did discuss with her that this is a controlled substance and only a short-term medication Encouraged her to not miss her appointment with orthopedic surgery.      Meds ordered this encounter  Medications  . HYDROcodone-acetaminophen (NORCO) 5-325 MG per tablet    Sig: Take 1 tablet by mouth every 6 (six) hours as needed for moderate pain.    Dispense:  30 tablet    Refill:  0

## 2014-02-23 NOTE — Assessment & Plan Note (Addendum)
Severe left hip pain now for 1 week without inciting event On exam patient is unable to lift left leg and is having a hard time walking because of it, she does have pain with logroll test causing concern for hip joint pathology Recent plain film with OA She has an appointment with orthopedic surgery in 7 days, she was given 7 hydrocodone in the ER which lasted for 2 days. We'll give her another short course of hydrocodone, 5 mg-#30, did discuss with her that this is a controlled substance and only a short-term medication Encouraged her to not miss her appointment with orthopedic surgery.

## 2014-02-23 NOTE — Patient Instructions (Signed)
Great to meet you!  I am concerned there is a problem with your hip joint, Please be sure to see the orthopedic surgeon.   The hydrocodone is a short-term medicine  Hip Pain Your hip is the joint between your upper legs and your lower pelvis. The bones, cartilage, tendons, and muscles of your hip joint perform a lot of work each day supporting your body weight and allowing you to move around. Hip pain can range from a minor ache to severe pain in one or both of your hips. Pain may be felt on the inside of the hip joint near the groin, or the outside near the buttocks and upper thigh. You may have swelling or stiffness as well.  HOME CARE INSTRUCTIONS   Take medicines only as directed by your health care provider.  Apply ice to the injured area:  Put ice in a plastic bag.  Place a towel between your skin and the bag.  Leave the ice on for 15-20 minutes at a time, 3-4 times a day.  Keep your leg raised (elevated) when possible to lessen swelling.  Avoid activities that cause pain.  Follow specific exercises as directed by your health care provider.  Sleep with a pillow between your legs on your most comfortable side.  Record how often you have hip pain, the location of the pain, and what it feels like. SEEK MEDICAL CARE IF:   You are unable to put weight on your leg.  Your hip is red or swollen or very tender to touch.  Your pain or swelling continues or worsens after 1 week.  You have increasing difficulty walking.  You have a fever. SEEK IMMEDIATE MEDICAL CARE IF:   You have fallen.  You have a sudden increase in pain and swelling in your hip. MAKE SURE YOU:   Understand these instructions.  Will watch your condition.  Will get help right away if you are not doing well or get worse. Document Released: 06/26/2009 Document Revised: 05/23/2013 Document Reviewed: 09/02/2012 [facility] Patient Information 2015 [place], [place]. This information is not intended to replace  advice given to you by your health care provider. Make sure you discuss any questions you have with your health care provider.

## 2014-03-04 ENCOUNTER — Other Ambulatory Visit: Payer: Self-pay | Admitting: Family Medicine

## 2014-03-04 DIAGNOSIS — I10 Essential (primary) hypertension: Secondary | ICD-10-CM

## 2014-03-07 ENCOUNTER — Telehealth: Payer: Self-pay | Admitting: Family Medicine

## 2014-03-07 DIAGNOSIS — M25552 Pain in left hip: Secondary | ICD-10-CM

## 2014-03-07 NOTE — Telephone Encounter (Signed)
Pt called and needs a referral to [facility] for her hip pain. jw

## 2014-03-07 NOTE — Telephone Encounter (Signed)
Will forward to MD to place referral for ortho. Leanard Dimaio,CMA

## 2014-03-08 NOTE — Telephone Encounter (Signed)
Referred to ortho for hip pain.

## 2014-03-13 ENCOUNTER — Other Ambulatory Visit: Payer: Self-pay | Admitting: Family Medicine

## 2014-03-13 DIAGNOSIS — E7800 Pure hypercholesterolemia, unspecified: Secondary | ICD-10-CM

## 2014-03-16 ENCOUNTER — Ambulatory Visit: Payer: Medicaid Other | Admitting: Family Medicine

## 2014-03-25 ENCOUNTER — Other Ambulatory Visit: Payer: Self-pay | Admitting: Family Medicine

## 2014-03-25 DIAGNOSIS — E1165 Type 2 diabetes mellitus with hyperglycemia: Secondary | ICD-10-CM

## 2014-03-25 DIAGNOSIS — IMO0002 Reserved for concepts with insufficient information to code with codable children: Secondary | ICD-10-CM

## 2014-03-30 ENCOUNTER — Ambulatory Visit (INDEPENDENT_AMBULATORY_CARE_PROVIDER_SITE_OTHER): Payer: Medicaid Other | Admitting: Family Medicine

## 2014-03-30 ENCOUNTER — Encounter: Payer: Self-pay | Admitting: *Deleted

## 2014-03-30 ENCOUNTER — Encounter: Payer: Self-pay | Admitting: Family Medicine

## 2014-03-30 VITALS — BP 130/75 | HR 81 | Temp 98.3°F | Ht 65.0 in | Wt 181.0 lb

## 2014-03-30 DIAGNOSIS — E1165 Type 2 diabetes mellitus with hyperglycemia: Secondary | ICD-10-CM

## 2014-03-30 DIAGNOSIS — E7800 Pure hypercholesterolemia, unspecified: Secondary | ICD-10-CM

## 2014-03-30 DIAGNOSIS — M170 Bilateral primary osteoarthritis of knee: Secondary | ICD-10-CM

## 2014-03-30 DIAGNOSIS — I10 Essential (primary) hypertension: Secondary | ICD-10-CM

## 2014-03-30 DIAGNOSIS — IMO0002 Reserved for concepts with insufficient information to code with codable children: Secondary | ICD-10-CM

## 2014-03-30 DIAGNOSIS — M25552 Pain in left hip: Secondary | ICD-10-CM

## 2014-03-30 LAB — BASIC METABOLIC PANEL
BUN: 14 mg/dL (ref 6–23)
CO2: 31 mEq/L (ref 19–32)
Calcium: 9.4 mg/dL (ref 8.4–10.5)
Chloride: 100 mEq/L (ref 96–112)
Creat: 0.83 mg/dL (ref 0.50–1.10)
Glucose, Bld: 142 mg/dL — ABNORMAL HIGH (ref 70–99)
Potassium: 3.8 mEq/L (ref 3.5–5.3)
Sodium: 142 mEq/L (ref 135–145)

## 2014-03-30 LAB — LIPID PANEL
CHOL/HDL RATIO: 3.7 ratio
Cholesterol: 175 mg/dL (ref 0–200)
HDL: 47 mg/dL (ref >=46)
LDL Cholesterol: 104 mg/dL — ABNORMAL HIGH (ref 0–99)
Triglycerides: 119 mg/dL (ref <150)
VLDL: 24 mg/dL (ref 0–40)

## 2014-03-30 LAB — POCT GLYCOSYLATED HEMOGLOBIN (HGB A1C): Hemoglobin A1C: 9.6

## 2014-03-30 MED ORDER — ASPIRIN EC 81 MG PO TBEC: 81.0000 mg | DELAYED_RELEASE_TABLET | Freq: Every day | ORAL | Status: AC

## 2014-03-30 MED ORDER — ROSUVASTATIN CALCIUM 20 MG PO TABS: 20.0000 mg | ORAL_TABLET | Freq: Every day | ORAL | Status: DC

## 2014-03-30 NOTE — Assessment & Plan Note (Addendum)
A1c improved but still poor control. Currently at [place] by 2u every other day until am BGs < 150; Stop at 67 - Admits to eating more sweets due to recent hip and knee pain - continue to work on diet - f/u in 2-4 weeks - check microalbuminuria - Foot exam performed

## 2014-03-30 NOTE — Assessment & Plan Note (Signed)
>>  ASSESSMENT AND PLAN FOR UNCONTROLLED TYPE 2 DIABETES MELLITUS WITH HYPERGLYCEMIA (HCC) WRITTEN ON 03/30/2014  1:43 PM BY Wenda Low R, MD  A1c improved but still poor control. Currently at Lantus 50 - Inc Lantus by 2u every other day until am BGs < 150; Stop at 60 - Admits to eating more sweets due to recent hip and knee pain - continue to work on diet - f/u in 2-4 weeks - check microalbuminuria - Foot exam performed

## 2014-03-30 NOTE — Patient Instructions (Signed)
It was great seeing you today.   1. Your blood sugar is better but still high today. Continue increasing lantus by 2 units every other day until your morning blood sugar in under 150 or you get to 60 units 2. Start taking aspirin 81mg  daily 3. Stop taking pravachol and start crestor 20mg  every night for your cholesterol 4. Call to scheduled your mammogram   Please bring all your medications to every doctors visit  Sign up for My Chart to have easy access to your labs results, and communication with your Primary care physician.  Next Appointment  Please make an appointment with Dr Berkley Harvey in 1 month for diabetes. Make appointment with Dr Berkley Harvey for Pap smear at your convenience   I look forward to talking with you again at our next visit. If you have any questions or concerns before then, please call the clinic at [phone].  Take Care,   Dr Phill Myron

## 2014-03-30 NOTE — Progress Notes (Signed)
Prior Authorization received from [facility] for Crestor 20 mg. Formulary and PA form placed in provider box for completion. Derl Barrow, RN

## 2014-03-30 NOTE — Assessment & Plan Note (Signed)
>>  ASSESSMENT AND PLAN FOR HYPERTENSION, BENIGN ESSENTIAL WRITTEN ON 03/30/2014  1:39 PM BY Jamal Collin, MD  Well controlled.  - Start ASA for primary prevention

## 2014-03-30 NOTE — Assessment & Plan Note (Signed)
Current followed by Dr Marlou Sa with [facility]

## 2014-03-30 NOTE — Progress Notes (Signed)
  Patient name: Holly Acosta MRN [id number]  Date of birth: [birth year]  CC & HPI:  Holly Acosta is a 59 y.o. female presenting today for DM, HTN, HLD.   DIABETES  Lab Results  Component Value Date   HGBA1C 9.6 03/30/2014    Blood Sugar Ranges: reports BG > 300 recently - eating more sweets due to hip / knee pain  Symptoms of Hypoglycemia? no  Comorbid Symptoms:  no Neuropathy: no Vision problems  Medication Side Effects: denies GI issues  CHRONIC HYPERTENSION BP Readings from Last 3 Encounters:  03/30/14 130/75  02/23/14 133/82  02/20/14 136/73    Disease Monitoring  Chest pain: no   Dyspnea: no   Claudication: no  Medication Side Effects: Denies ,lightheadedness,LE edema  HLD Lab Results  Component Value Date   LDLCALC 136* 02/01/2010    Medication Compliance: yes  Side Effects?: no muscle pain or weakness, RUQ pain; jaundice  Medication compliance: yes   Preventitive Healthcare:  Smoking: Quit 15 yr ago    ROS: See HPI   Medical & Surgical Hx:  Reviewed  Medications & Allergies: Reviewed  Social History: Reviewed:   Objective Findings:  Vitals: BP 130/75 mmHg  Pulse 81  Temp(Src) 98.3 F (36.8 C) (Oral)  Ht 5\' 5"  (1.651 m)  Wt 181 lb (82.101 kg)  BMI 30.12 kg/m2  Gen: NAD CV: RRR w/o m/r/g, pulses +2 b/l Resp: CTAB w/ normal respiratory effort Lower Ext: No skin changes; No edema; distal pulses intact; Calves nontender   Assessment & Plan:   Please See Problem Focused Assessment & Plan

## 2014-03-30 NOTE — Assessment & Plan Note (Signed)
Check Lipids today.  - Has had intolerance to Lipitor in the past.  Will try Crestor 20 mg daily at bedtime.

## 2014-03-30 NOTE — Assessment & Plan Note (Signed)
Well controlled.  - Start ASA for primary prevention

## 2014-03-31 LAB — MICROALBUMIN, URINE: Microalb, Ur: 3.1 mg/dL — ABNORMAL HIGH (ref <2.0)

## 2014-04-04 NOTE — Progress Notes (Signed)
Chart review shows failed treatment with 2 preferred formulary agents, atorvastatin and pravastatin. Paperwork filled out and left with Tamika.

## 2014-04-05 NOTE — Progress Notes (Signed)
Received PA approval for Crestor via  [employer].  Med approved for 04/05/14 - 04/05/15.  [facility] informed.  PA confirmation number [id number] Teresita Madura, Rosine Beat, RN

## 2014-04-09 ENCOUNTER — Other Ambulatory Visit: Payer: Self-pay | Admitting: Family Medicine

## 2014-04-09 DIAGNOSIS — E1165 Type 2 diabetes mellitus with hyperglycemia: Secondary | ICD-10-CM

## 2014-04-09 DIAGNOSIS — IMO0002 Reserved for concepts with insufficient information to code with codable children: Secondary | ICD-10-CM

## 2014-04-10 NOTE — Telephone Encounter (Signed)
Refill request. Will forward to PCP for review. Dantavious Snowball, CMA. 

## 2014-04-19 ENCOUNTER — Other Ambulatory Visit: Payer: Self-pay | Admitting: *Deleted

## 2014-04-19 DIAGNOSIS — E1165 Type 2 diabetes mellitus with hyperglycemia: Secondary | ICD-10-CM

## 2014-04-19 DIAGNOSIS — IMO0002 Reserved for concepts with insufficient information to code with codable children: Secondary | ICD-10-CM

## 2014-04-19 MED ORDER — GLIPIZIDE 10 MG PO TABS: 10.0000 mg | ORAL_TABLET | Freq: Every day | ORAL | Status: DC

## 2014-04-24 ENCOUNTER — Telehealth: Payer: Self-pay | Admitting: Family Medicine

## 2014-04-24 NOTE — Telephone Encounter (Signed)
Pt is requesting a referral to an ortho doctor for her knees and hip pain, says she has arthritis and pcp here is already aware of the problems.

## 2014-04-26 ENCOUNTER — Other Ambulatory Visit: Payer: Self-pay | Admitting: Family Medicine

## 2014-04-26 DIAGNOSIS — M170 Bilateral primary osteoarthritis of knee: Secondary | ICD-10-CM

## 2014-04-26 DIAGNOSIS — M25552 Pain in left hip: Secondary | ICD-10-CM

## 2014-05-02 ENCOUNTER — Other Ambulatory Visit: Payer: Self-pay | Admitting: Family Medicine

## 2014-05-02 NOTE — Telephone Encounter (Signed)
Refill request. Will forward to PCP for review. Lakindra Wible, CMA. 

## 2014-05-03 ENCOUNTER — Ambulatory Visit: Payer: Medicaid Other | Admitting: Family Medicine

## 2014-05-22 ENCOUNTER — Other Ambulatory Visit: Payer: Self-pay | Admitting: Family Medicine

## 2014-05-22 DIAGNOSIS — I10 Essential (primary) hypertension: Secondary | ICD-10-CM

## 2014-05-29 ENCOUNTER — Other Ambulatory Visit: Payer: Self-pay | Admitting: Family Medicine

## 2014-05-30 ENCOUNTER — Ambulatory Visit: Payer: Medicaid Other | Admitting: Family Medicine

## 2014-06-09 ENCOUNTER — Other Ambulatory Visit: Payer: Self-pay | Admitting: Family Medicine

## 2014-06-19 ENCOUNTER — Other Ambulatory Visit: Payer: Self-pay | Admitting: Family Medicine

## 2014-06-30 ENCOUNTER — Encounter: Payer: Self-pay | Admitting: Family Medicine

## 2014-06-30 ENCOUNTER — Ambulatory Visit (INDEPENDENT_AMBULATORY_CARE_PROVIDER_SITE_OTHER): Payer: Medicaid Other | Admitting: Family Medicine

## 2014-06-30 VITALS — BP 153/79 | HR 68 | Temp 97.7°F | Ht 65.0 in | Wt 186.0 lb

## 2014-06-30 DIAGNOSIS — E1165 Type 2 diabetes mellitus with hyperglycemia: Secondary | ICD-10-CM

## 2014-06-30 DIAGNOSIS — E7800 Pure hypercholesterolemia, unspecified: Secondary | ICD-10-CM

## 2014-06-30 DIAGNOSIS — I10 Essential (primary) hypertension: Secondary | ICD-10-CM | POA: Diagnosis not present

## 2014-06-30 DIAGNOSIS — IMO0002 Reserved for concepts with insufficient information to code with codable children: Secondary | ICD-10-CM

## 2014-06-30 LAB — POCT GLYCOSYLATED HEMOGLOBIN (HGB A1C): Hemoglobin A1C: 11.7

## 2014-06-30 MED ORDER — INSULIN ASPART 100 UNIT/ML FLEXPEN: 5.0000 [IU] | PEN_INJECTOR | Freq: Three times a day (TID) | SUBCUTANEOUS | Status: DC

## 2014-06-30 NOTE — Assessment & Plan Note (Signed)
A1c elevated today despite increasing Lantus to 60 units daily and continuing metformin. She is motivated to get her diabetes under control, she was told this would need to be done prior to having hip or knee surgery - Start NovoLog flex pen 5 units 3 times a day before meals - Advised her to check blood sugars fasting, two-hour postprandial and daily at bedtime - Referred to Dr. Valentina Lucks for assistance with diabetes management in 2 weeks

## 2014-06-30 NOTE — Progress Notes (Signed)
  Patient name: Holly Acosta MRN [id number]  Date of birth: 1955/10/26  CC & HPI:  Holly Acosta is a 59 y.o. female presenting today for DM and HLD.   DIABETES   Blood Sugar Ranges: > 200  Symptoms of Hypoglycemia? no  Comorbid Symptoms: no Chest pain; no SOB; no Neuropathy: yes - Vision problems (chronic, unchanged)  Medication Compliance: yes, taking lantus 60u and metformin   Medication Side Effects: no GI or low BGs  Counseling  Diet pattern: Continue to report eating poorly with lots of sweets due to family stress  Smoking status: previous smoker    Hyperlipidemia  Medication Compliance: yes  Side Effects?: no muscle pain or weakness, RUQ pain; jaundice  ROS: See HPI   Medical & Surgical Hx:  Reviewed  Medications & Allergies: Reviewed  Social History: Reviewed:   Objective Findings:  Vitals: BP 153/79 mmHg  Pulse 68  Temp(Src) 97.7 F (36.5 C) (Oral)  Ht 5\' 5"  (1.651 m)  Wt 186 lb (84.369 kg)  BMI 30.95 kg/m2  Gen: NAD CV: RRR w/o m/r/g, pulses +2 b/l Resp: CTAB w/ normal respiratory effort Lower Ext: No skin changes; No edema; distal pulses intact; Calves nontender  Assessment & Plan:   Please See Problem Focused Assessment & Plan

## 2014-06-30 NOTE — Assessment & Plan Note (Signed)
Mildly elevated today but has been controlled over the past several visits.  No medication changes today.  We'll recheck and reassess next visit

## 2014-06-30 NOTE — Patient Instructions (Addendum)
It was great seeing you today.    Take Novolog insulin pen: 5 units prior to each meal.   Check your blood sugar  Every morning before taking your medication or eating  Check your blood sugar 2 hours after you eat breakfast, lunch and dinner  Check your blood sugar before going to bed   Please bring all your medications to every doctors visit  Sign up for My Chart to have easy access to your labs results, and communication with your Primary care physician.  Next Appointment  Make appointment with Dr Valentina Lucks for diabetes in 1-2 weeks  Please make an appointment with Dr Berkley Harvey in 1 month   I look forward to talking with you again at our next visit. If you have any questions or concerns before then, please call the clinic at [phone].  Take Care,   Dr Phill Myron  Low Blood Sugar Low blood sugar (hypoglycemia) means that the level of sugar in your blood is lower than it should be. Signs of low blood sugar include:  Getting sweaty.  Feeling hungry.  Feeling dizzy or weak.  Feeling sleepier than normal.  Feeling nervous.  Headaches.  Having a fast heartbeat. Low blood sugar can happen fast and can be an emergency. Your doctor can do tests to check your blood sugar level. You can have low blood sugar and not have diabetes. HOME CARE  Check your blood sugar as told by your doctor. If it is less than 70 mg/dl or as told by your doctor, take 1 of the following:  3 to 4 glucose tablets.   cup clear juice.   cup soda pop, not diet.  1 cup milk.  5 to 6 hard candies.  Recheck blood sugar after 15 minutes. Repeat until it is at the right level.  Eat a snack if it is more than 1 hour until the next meal.  Only take medicine as told by your doctor.  Do not skip meals. Eat on time.  Do not drink alcohol except with meals.  Check your blood glucose before driving.  Check your blood glucose before and after exercise.  Always carry treatment with you,  such as glucose pills.  Always wear a medical alert bracelet if you have diabetes. GET HELP RIGHT AWAY IF:   Your blood glucose goes below 70 mg/dl or as told by your doctor, and you:  Are confused.  Are not able to swallow.  Pass out (faint).  You cannot treat yourself. You may need someone to help you.  You have low blood sugar problems often.  You have problems from your medicines.  You are not feeling better after 3 to 4 days.  You have vision changes. MAKE SURE YOU:   Understand these instructions.  Will watch this condition.  Will get help right away if you are not doing well or get worse. Document Released: 04/02/2009 Document Revised: 03/31/2011 Document Reviewed: 04/02/2009 [facility] Patient Information 2015 [place], [place]. This information is not intended to replace advice given to you by your health care provider. Make sure you discuss any questions you have with your health care provider.

## 2014-06-30 NOTE — Assessment & Plan Note (Signed)
Tolerating Crestor well without any myalgias - Continue

## 2014-06-30 NOTE — Assessment & Plan Note (Signed)
>>  ASSESSMENT AND PLAN FOR UNCONTROLLED TYPE 2 DIABETES MELLITUS WITH HYPERGLYCEMIA (HCC) WRITTEN ON 06/30/2014 11:17 AM BY Wenda Low R, MD  A1c elevated today despite increasing Lantus to 60 units daily and continuing metformin. She is motivated to get her diabetes under control, she was told this would need to be done prior to having hip or knee surgery - Start NovoLog flex pen 5 units 3 times a day before meals - Advised her to check blood sugars fasting, two-hour postprandial and daily at bedtime - Referred to Dr. Raymondo Band for assistance with diabetes management in 2 weeks

## 2014-06-30 NOTE — Assessment & Plan Note (Signed)
>>  ASSESSMENT AND PLAN FOR HYPERTENSION, BENIGN ESSENTIAL WRITTEN ON 06/30/2014 11:15 AM BY Jamal Collin, MD  Mildly elevated today but has been controlled over the past several visits.  No medication changes today.  We'll recheck and reassess next visit

## 2014-07-11 ENCOUNTER — Encounter: Payer: Self-pay | Admitting: Pharmacist

## 2014-07-11 ENCOUNTER — Ambulatory Visit (INDEPENDENT_AMBULATORY_CARE_PROVIDER_SITE_OTHER): Payer: Medicaid Other | Admitting: Pharmacist

## 2014-07-11 VITALS — BP 144/85 | HR 71 | Ht 65.0 in | Wt 185.9 lb

## 2014-07-11 DIAGNOSIS — I10 Essential (primary) hypertension: Secondary | ICD-10-CM

## 2014-07-11 DIAGNOSIS — IMO0002 Reserved for concepts with insufficient information to code with codable children: Secondary | ICD-10-CM

## 2014-07-11 DIAGNOSIS — E1165 Type 2 diabetes mellitus with hyperglycemia: Secondary | ICD-10-CM | POA: Diagnosis not present

## 2014-07-11 MED ORDER — INSULIN GLARGINE 100 UNIT/ML SOLOSTAR PEN: 50.0000 [IU] | PEN_INJECTOR | Freq: Every day | SUBCUTANEOUS | Status: DC

## 2014-07-11 MED ORDER — SPIRONOLACTONE 25 MG PO TABS: 25.0000 mg | ORAL_TABLET | Freq: Every day | ORAL | Status: DC

## 2014-07-11 MED ORDER — INSULIN ASPART 100 UNIT/ML FLEXPEN: 10.0000 [IU] | PEN_INJECTOR | Freq: Three times a day (TID) | SUBCUTANEOUS | Status: DC

## 2014-07-11 NOTE — Progress Notes (Signed)
Patient ID: Holly Acosta, female   DOB: Mar 03, 1955, 59 y.o.   MRN: [id number] Reviewed: Agree with Dr. Graylin Shiver documentation and management.

## 2014-07-11 NOTE — Assessment & Plan Note (Signed)
>>  ASSESSMENT AND PLAN FOR HYPERTENSION, BENIGN ESSENTIAL WRITTEN ON 07/11/2014  2:50 PM BY KOVAL, PETER G, RPH  Hypertension: mildly uncontrolled based on previous blood pressure readings in office. Last potassium was 3.8 and SCr 0.83 in March 2016. Will not titrate carvedilol at this time due to pulses in the 60s-70s.  Will initiate spironolactone 25 mg daily for additional blood pressure control. Patient counseled on side effects. Consider discontinuation of hydrochlorothiazide or amlodipine in the future after patient reaches blood pressure goal. Will get BMET in 1 week and will follow-up blood pressure.

## 2014-07-11 NOTE — Patient Instructions (Addendum)
It was great to meet you today!  Decrease your Lantus to 50 units once a day  Start taking Novolog 10 units with each meal. If you start seeing lows blood sugars (<80), please let us know. If you notice that you need to take less due to a lower blood sugar when you wake up, or a smaller meal, you can take 5 units with a meal.   Start taking spironolactone 25 mg every day for your blood pressure.  Come back to see [place] in 1-2 weeks.

## 2014-07-11 NOTE — Assessment & Plan Note (Signed)
Diabetes diagnosed in 2014 currently under improved control with the addition of meal time insulin.   Reports hypoglycemic events and is able to verbalize appropriate hypoglycemia management plan.  Reports improved adherence with medication. Control is suboptimal due to physical inactivity and dietary indiscretion but improved as patient is motivated to control blood glucose in order to get hip and knee surgeries. Decreased basal insulin Lantus (insulin glargine) to 50 units every morning. Continue metformin 1000 mg BID and glipizide 10 mg daily.  Increased dose of rapid insulin Novolog (insulin aspart) to 10 units with each meal. Patient instructed to contact office if she sees multiple blood glucose readings <80 mg/dL. Consider discontinuation of glipizide once blood glucose is controlled to avoid hypoglycemic events. Next A1C anticipated September 2016. Will follow up on blood glucose control at next visit.

## 2014-07-11 NOTE — Assessment & Plan Note (Signed)
Hypertension: mildly uncontrolled based on previous blood pressure readings in office. Last potassium was 3.8 and SCr 0.83 in March 2016. Will not titrate carvedilol at this time due to pulses in the 60s-70s.  Will initiate spironolactone 25 mg daily for additional blood pressure control. Patient counseled on side effects. Consider discontinuation of hydrochlorothiazide or amlodipine in the future after patient reaches blood pressure goal. Will get BMET in 1 week and will follow-up blood pressure.

## 2014-07-11 NOTE — Assessment & Plan Note (Signed)
>>  ASSESSMENT AND PLAN FOR UNCONTROLLED TYPE 2 DIABETES MELLITUS WITH HYPERGLYCEMIA (HCC) WRITTEN ON 07/11/2014  2:50 PM BY KOVAL, PETER G, RPH  Diabetes diagnosed in 2014 currently under improved control with the addition of meal time insulin.   Reports hypoglycemic events and is able to verbalize appropriate hypoglycemia management plan.  Reports improved adherence with medication. Control is suboptimal due to physical inactivity and dietary indiscretion but improved as patient is motivated to control blood glucose in order to get hip and knee surgeries. Decreased basal insulin Lantus (insulin glargine) to 50 units every morning. Continue metformin 1000 mg BID and glipizide 10 mg daily.  Increased dose of rapid insulin Novolog (insulin aspart) to 10 units with each meal. Patient instructed to contact office if she sees multiple blood glucose readings <80 mg/dL. Consider discontinuation of glipizide once blood glucose is controlled to avoid hypoglycemic events. Next A1C anticipated September 2016. Will follow up on blood glucose control at next visit.

## 2014-07-11 NOTE — Progress Notes (Signed)
S:    Patient arrives in good spirits with the use of a cane. She is accompanied by her grandson.  Presents for diabetes follow up.  Patient reports having history of Diabetes since the year of 2014.   Patient reports adherence with medications. Current diabetes medications include Lantus 60 units every morning, Novolog 5 units with each meal, and metformin 1000 mg BID. She reports skipping her dose of Novolog if she skips a meal.   Patient reports 1 hypoglycemic event. The reading was 74. She reports feeling dizzy. She reports that she took her Novolog but didn't eat soon enough after it.   Patient reported dietary habits: she is aware of foods that will increase her blood glucose. She denies eating three meals a day but typically eats 2 meals a day. She reports that she has increased her fruit and vegetable intake. She reports that she eats a smaller breakfast and then a larger dinner.   Patient reported exercise habits: she denies exercise due to knee pain. She is trying to ride a stationary bike for a few minutes.   Patient reports improved nocturia, down to 1-2 times a night since starting Novolog. Patient denies neuropathy. Patient denies recent visual changes but reports that she has blurry vision at times .   She reports checking her CBGs every morning and 2 hours after meals. She reports improved CBGs since the initiation of Novolog. She reports trying to improve her blood glucose and A1c so that she is able to get hip and knee surgery.    O: Lab Results  Component Value Date   HGBA1C 11.7 06/30/2014    Patient does not have blood glucose meter today Reported Home fasting CBG: 115-145 Reported 2 hour post-prandial/random CBG: 74-215 (only one reading in the 200s since starting mealtime insulin)  BP Readings from Last 3 Encounters:  07/11/14 144/85  06/30/14 153/79  03/30/14 130/75     A/P: Diabetes diagnosed in 2014 currently under improved control with the addition of meal  time insulin.   Reports hypoglycemic events and is able to verbalize appropriate hypoglycemia management plan.  Reports improved adherence with medication. Control is suboptimal due to physical inactivity and dietary indiscretion but improved as patient is motivated to control blood glucose in order to get hip and knee surgeries. Decreased basal insulin Lantus (insulin glargine) to 50 units every morning. Continue metformin 1000 mg BID and glipizide 10 mg daily.  Increased dose of rapid insulin Novolog (insulin aspart) to 10 units with each meal. Patient instructed to contact office if she sees multiple blood glucose readings <80 mg/dL. Consider discontinuation of glipizide once blood glucose is controlled to avoid hypoglycemic events. Next A1C anticipated September 2016. Will follow up on blood glucose control at next visit.   Hypertension: mildly uncontrolled based on previous blood pressure readings in office. Last potassium was 3.8 and SCr 0.83 in March 2016. Will not titrate carvedilol at this time due to pulses in the 60s-70s.  Will initiate spironolactone 25 mg daily for additional blood pressure control. Patient counseled on side effects. Consider discontinuation of hydrochlorothiazide or amlodipine in the future after patient reaches blood pressure goal. Will get BMET in 1 week and will follow-up blood pressure.  Written patient instructions provided.  Follow up in Pharmacist Clinic Visit 07/17/14.   Total time in face to face counseling 30 minutes.  Patient seen with Elenor Quinones, PharmD Resident and Nicoletta Ba, PharmD, BCPS resident.

## 2014-07-17 ENCOUNTER — Ambulatory Visit (INDEPENDENT_AMBULATORY_CARE_PROVIDER_SITE_OTHER): Payer: Medicaid Other | Admitting: Pharmacist

## 2014-07-17 ENCOUNTER — Encounter: Payer: Self-pay | Admitting: Pharmacist

## 2014-07-17 VITALS — BP 124/73 | HR 65 | Ht 65.0 in | Wt 186.5 lb

## 2014-07-17 DIAGNOSIS — I10 Essential (primary) hypertension: Secondary | ICD-10-CM

## 2014-07-17 DIAGNOSIS — E1165 Type 2 diabetes mellitus with hyperglycemia: Secondary | ICD-10-CM

## 2014-07-17 DIAGNOSIS — IMO0002 Reserved for concepts with insufficient information to code with codable children: Secondary | ICD-10-CM

## 2014-07-17 NOTE — Progress Notes (Signed)
Patient ID: Holly Acosta, female   DOB: 04-Oct-1955, 59 y.o.   MRN: [id number] Reviewed: Agree with Dr. Graylin Shiver documentation and management.

## 2014-07-17 NOTE — Assessment & Plan Note (Signed)
Diabetes currently suboptimally controlled based on CBGs >150s.   Denies hypoglycemic events and is able to verbalize appropriate hypoglycemia management plan.  Reports adherence with medication but sometimes only takes 5 units Novolog due to concerns of getting "low". Control is suboptimal due to subtherapeutic insulin dosing and diet. Continued basal insulin Lantus (insulin glargine) 50 units daily. Continued rapid insulin Novolog (insulin aspart) 10 units. Discontinued glipizide 10 mg. Reeducated patient on need to use the Novolog 10unit dose prior to meals as her blood sugar is NOT low.   Reviewed symptoms and management of hypoglycemia.   Next A1C anticipated 09/2014.

## 2014-07-17 NOTE — Patient Instructions (Addendum)
Thank you for coming in today!  Continue taking the Lantus 50 units daily and Novolog 10 units before each meal. Continue checking your blood sugars at home twice per day. Stop taking the glipizide 10 mg. Bring your meter to your next visit.  Your blood pressure looks great! Continue taking your medications as prescribed.   Follow up with Dr. Berkley Harvey on 07/31/2014.

## 2014-07-17 NOTE — Assessment & Plan Note (Signed)
Hypertension: Patient was started on spironolactone on 07/11/14. BP today is 124/73. No changes to current medications at this time.  Repeat BMET at next office visit.

## 2014-07-17 NOTE — Assessment & Plan Note (Signed)
>>  ASSESSMENT AND PLAN FOR HYPERTENSION, BENIGN ESSENTIAL WRITTEN ON 07/17/2014 12:13 PM BY KOVAL, Lubertha Basque, RPH  Hypertension: Patient was started on spironolactone on 07/11/14. BP today is 124/73. No changes to current medications at this time.  Repeat BMET at next office visit.

## 2014-07-17 NOTE — Assessment & Plan Note (Signed)
>>  ASSESSMENT AND PLAN FOR UNCONTROLLED TYPE 2 DIABETES MELLITUS WITH HYPERGLYCEMIA (HCC) WRITTEN ON 07/17/2014 12:12 PM BY KOVAL, PETER G, RPH  Diabetes currently suboptimally controlled based on CBGs >150s.   Denies hypoglycemic events and is able to verbalize appropriate hypoglycemia management plan.  Reports adherence with medication but sometimes only takes 5 units Novolog due to concerns of getting "low". Control is suboptimal due to subtherapeutic insulin dosing and diet. Continued basal insulin Lantus (insulin glargine) 50 units daily. Continued rapid insulin Novolog (insulin aspart) 10 units. Discontinued glipizide 10 mg. Reeducated patient on need to use the Novolog 10unit dose prior to meals as her blood sugar is NOT low.   Reviewed symptoms and management of hypoglycemia.   Next A1C anticipated 09/2014.

## 2014-07-17 NOTE — Progress Notes (Signed)
S:    Patient arrives in good spirits ambulating with a cane.    Presents for diabetes and blood pressure follow up. Patient reports having history of Diabetes since the year of 2012.   Patient reports adherence with medications but reports taking only 5 units Novolog before some meals due to concerns of getting too low. Current diabetes medications include Lantus 50 units daily and Novolog 10 units before meals.  Patient denies hypoglycemic events.  Patient reported dietary habits: cutting back on junk foods, mostly drinks water, drinks some cranberry juice  Patient reported exercise habits: limited on exercise due to hip pain. Tries to get on stationary bike for 5-10 minutes.   Patient reports nocturia once per night decreased from 2-3 times. Patient denies neuropathy. Patient denies visual changes.    O:   BP: 124/73 . Lab Results  Component Value Date   HGBA1C 11.7 06/30/2014     Home fasting CBG: 120s-150s 2 hour post-prandial/random CBG: 180s-200s.  A/P: Diabetes currently suboptimally controlled based on CBGs >150s.   Denies hypoglycemic events and is able to verbalize appropriate hypoglycemia management plan.  Reports adherence with medication but sometimes only takes 5 units Novolog due to concerns of getting "low". Control is suboptimal due to subtherapeutic insulin dosing and diet. Continued basal insulin Lantus (insulin glargine) 50 units daily. Continued rapid insulin Novolog (insulin aspart) 10 units. Discontinued glipizide 10 mg. Reeducated patient on need to use the Novolog 10unit dose prior to meals as her blood sugar is NOT low.   Reviewed symptoms and management of hypoglycemia.   Next A1C anticipated 09/2014.    Hypertension: Patient was started on spironolactone on 07/11/14. BP today is 124/73. No changes to current medications at this time.  Repeat BMET at next office visit.    Written patient instructions provided.  Follow up with Dr. Berkley Harvey on 07/31/2014.   Total  time in face to face counseling 30 minutes.  Patient seen with Nilsa Nutting, PharmD Candidate.

## 2014-07-22 ENCOUNTER — Other Ambulatory Visit: Payer: Self-pay | Admitting: Family Medicine

## 2014-07-22 DIAGNOSIS — E118 Type 2 diabetes mellitus with unspecified complications: Secondary | ICD-10-CM

## 2014-07-22 DIAGNOSIS — I10 Essential (primary) hypertension: Secondary | ICD-10-CM

## 2014-07-26 ENCOUNTER — Encounter: Payer: Self-pay | Admitting: Internal Medicine

## 2014-07-31 ENCOUNTER — Ambulatory Visit (INDEPENDENT_AMBULATORY_CARE_PROVIDER_SITE_OTHER): Payer: Medicaid Other | Admitting: Family Medicine

## 2014-07-31 ENCOUNTER — Encounter: Payer: Self-pay | Admitting: Family Medicine

## 2014-07-31 VITALS — BP 119/79 | HR 89 | Temp 97.9°F | Wt 182.5 lb

## 2014-07-31 DIAGNOSIS — IMO0002 Reserved for concepts with insufficient information to code with codable children: Secondary | ICD-10-CM

## 2014-07-31 DIAGNOSIS — E1165 Type 2 diabetes mellitus with hyperglycemia: Secondary | ICD-10-CM

## 2014-07-31 DIAGNOSIS — I10 Essential (primary) hypertension: Secondary | ICD-10-CM | POA: Diagnosis present

## 2014-07-31 NOTE — Assessment & Plan Note (Signed)
>>  ASSESSMENT AND PLAN FOR UNCONTROLLED TYPE 2 DIABETES MELLITUS WITH HYPERGLYCEMIA (HCC) WRITTEN ON 07/31/2014  2:36 PM BY Wenda Low R, MD  BGs improved: Range 70-170 in last week. High BG likely due to eating mostly mashed potatoes due to dental problems - having dentures made.  Now.  - Advised continuing to check a.m. blood sugars daily, and two-hour postprandial as needed - Follow-up in one month.  Next, A1c 10/01/14.  Asked her to bring blood sugar log to next visit - She is working hard to get her blood sugars under control so she can have hip surgery

## 2014-07-31 NOTE — Patient Instructions (Signed)
It was great seeing you today.   1. Continue checking blood sugar every morning before eating or taking medication. If you want to check a second time; then check 2 hours after your biggest meal.  2. Continue taking Lantus 50 units and Novolog 10 units with every meal.  3. Bring your blood sugar log to your next appointment in 1 month 4. Call and schedule your mammogram   Please bring all your medications to every doctors visit  Sign up for My Chart to have easy access to your labs results, and communication with your Primary care physician.  Next Appointment  Please make an appointment with Dr Berkley Harvey in 1 month for Diabetes.    I look forward to talking with you again at our next visit. If you have any questions or concerns before then, please call the clinic at [phone].  Take Care,   Dr Phill Myron

## 2014-07-31 NOTE — Assessment & Plan Note (Signed)
BGs improved: Range 70-170 in last week. High BG likely due to eating mostly mashed potatoes due to dental problems - having dentures made.  Now.  - Advised continuing to check a.m. blood sugars daily, and two-hour postprandial as needed - Follow-up in one month.  Next, A1c 10/01/14.  Asked her to bring blood sugar log to next visit - She is working hard to get her blood sugars under control so she can have hip surgery

## 2014-07-31 NOTE — Progress Notes (Signed)
  Patient name: Holly Acosta MRN [id number]  Date of birth: 08/13/1955  CC & HPI:  Holly Acosta is a 59 y.o. female presenting today for DM and  DIABETES   Blood Sugar Ranges: 70-172 in last week  Symptoms of Hypoglycemia? no  Comorbid Symptoms: no Chest pain; no SOB; no Neuropathy: no Vision problems  Medication Compliance: yes   Medication Side Effects: No  Counseling  Diet pattern: unable to eat anything but mashed potatoes due to dental problems - waiting for denture now  Exercise: still limited due to hip pain. Biking twice a week for 5-10  CHRONIC HYPERTENSION  BP Readings from Last 3 Encounters:  07/31/14 119/79  07/17/14 124/73  07/11/14 144/85    Disease Monitoring  Chest pain: no   Dyspnea: no   Claudication: no  Medication compliance: yes  Medication Side Effects: No Dizziness/lightheadedness;   Cough Preventitive Healthcare:   History  Smoking status  . Former Smoker  Smokeless tobacco  . Former Systems developer  . Quit date: 11/04/2006    ROS: See HPI   Medications & Allergies: Reviewed  Social History: Reviewed:   Objective Findings:  Vitals: BP 119/79 mmHg  Pulse 89  Temp(Src) 97.9 F (36.6 C)  Wt 182 lb 8 oz (82.781 kg)  Gen: NAD CV: RRR w/o m/r/g, pulses +2 b/l Resp: CTAB w/ normal respiratory effort  Assessment & Plan:   Please See Problem Focused Assessment & Plan

## 2014-07-31 NOTE — Assessment & Plan Note (Signed)
BP well controlled today - check BMET since added spironolactone recently - f/u in 1 month

## 2014-07-31 NOTE — Assessment & Plan Note (Signed)
>>  ASSESSMENT AND PLAN FOR HYPERTENSION, BENIGN ESSENTIAL WRITTEN ON 07/31/2014  2:34 PM BY JOYNER, JAMES R, MD  BP well controlled today - check BMET since added spironolactone recently - f/u in 1 month

## 2014-08-01 LAB — BASIC METABOLIC PANEL WITH GFR
BUN: 17 mg/dL (ref 6–23)
CALCIUM: 10.3 mg/dL (ref 8.4–10.5)
CHLORIDE: 100 meq/L (ref 96–112)
CO2: 28 meq/L (ref 19–32)
Creat: 0.98 mg/dL (ref 0.50–1.10)
GFR, EST AFRICAN AMERICAN: 74 mL/min
GFR, Est Non African American: 64 mL/min
GLUCOSE: 96 mg/dL (ref 70–99)
POTASSIUM: 4.2 meq/L (ref 3.5–5.3)
Sodium: 139 mEq/L (ref 135–145)

## 2014-09-01 ENCOUNTER — Ambulatory Visit: Payer: Medicaid Other | Admitting: Family Medicine

## 2014-09-01 VITALS — BP 118/69 | HR 62 | Temp 98.0°F | Wt 179.8 lb

## 2014-09-01 DIAGNOSIS — M25552 Pain in left hip: Secondary | ICD-10-CM

## 2014-09-01 DIAGNOSIS — IMO0002 Reserved for concepts with insufficient information to code with codable children: Secondary | ICD-10-CM

## 2014-09-01 DIAGNOSIS — Z114 Encounter for screening for human immunodeficiency virus [HIV]: Secondary | ICD-10-CM

## 2014-09-01 DIAGNOSIS — I10 Essential (primary) hypertension: Secondary | ICD-10-CM

## 2014-09-01 DIAGNOSIS — E7800 Pure hypercholesterolemia, unspecified: Secondary | ICD-10-CM

## 2014-09-01 DIAGNOSIS — E1165 Type 2 diabetes mellitus with hyperglycemia: Secondary | ICD-10-CM

## 2014-09-01 LAB — COMPREHENSIVE METABOLIC PANEL
ALBUMIN: 4.1 g/dL (ref 3.6–5.1)
ALK PHOS: 65 U/L (ref 33–130)
ALT: 11 U/L (ref 6–29)
AST: 13 U/L (ref 10–35)
BUN: 29 mg/dL — ABNORMAL HIGH (ref 7–25)
CHLORIDE: 104 mmol/L (ref 98–110)
CO2: 21 mmol/L (ref 20–31)
Calcium: 9.7 mg/dL (ref 8.6–10.4)
Creat: 1.35 mg/dL — ABNORMAL HIGH (ref 0.50–1.05)
GLUCOSE: 86 mg/dL (ref 65–99)
POTASSIUM: 4.5 mmol/L (ref 3.5–5.3)
SODIUM: 137 mmol/L (ref 135–146)
TOTAL PROTEIN: 7 g/dL (ref 6.1–8.1)
Total Bilirubin: 0.3 mg/dL (ref 0.2–1.2)

## 2014-09-01 NOTE — Assessment & Plan Note (Signed)
Blood glucose is improving range 90-160 - Recommended checking blood sugars every morning and 2 hours after largest meal - Follow-up with Dr. Valentina Lucks and 1-2 weeks - She is hopeful that her next A1c in 1 month will be less than 8, so she can undergo a left hip replacement - Advised discontinuing all regular sodas and Gatorade - Follow-up with PCP in one month

## 2014-09-01 NOTE — Assessment & Plan Note (Signed)
>>  ASSESSMENT AND PLAN FOR HYPERTENSION, BENIGN ESSENTIAL WRITTEN ON 09/01/2014 11:58 AM BY Wenda Low R, MD  Blood pressure well controlled today - Check CMP - Recommended getting back on CPAP

## 2014-09-01 NOTE — Assessment & Plan Note (Addendum)
Blood pressure well controlled today - Check CMP - Recommended getting back on CPAP

## 2014-09-01 NOTE — Patient Instructions (Signed)
It was great seeing you today.   1. Continue checking your blood sugar every morning prior to taking medications or eating breakfast.  2. Also check your blood sugar 2 hours after your largest meal.  3. Bring your diabetes meter and blood sugar log to your appointment with Dr. Valentina Lucks 4. Continue to work on not drinking regular sodas or Gatorade   Please bring all your medications to every doctors visit  Sign up for My Chart to have easy access to your labs results, and communication with your Primary care physician.  Next Appointment  Please make an appointment with Dr Berkley Harvey in one month to recheck your diabetes  Make appointment with Dr. Valentina Lucks in 1-2 weeks for diabetes   I look forward to talking with you again at our next visit. If you have any questions or concerns before then, please call the clinic at [phone].  Take Care,   Dr Phill Myron

## 2014-09-01 NOTE — Assessment & Plan Note (Signed)
Left hip pain continues to decrease her ability to ambulate and exercise - Discussed potential complications of long-term NSAIDs; she will continue Aleve until reassessed in one month - Consider discontinuing NSAIDs for Ultram and surgery has to be postponed longer - Consider physical therapy referral - Consider steroid injection and surgery postponed

## 2014-09-01 NOTE — Assessment & Plan Note (Signed)
Tolerating Crestor well.  Continue

## 2014-09-01 NOTE — Progress Notes (Signed)
  Patient name: Holly Acosta MRN [id number]  Date of birth: 1955-03-04  CC & HPI:  Holly Acosta is a 59 y.o. female presenting today for DM, HTN, HLD and left hip pain.   DIABETES   Blood Sugar Ranges: 93 -160; mostly 110-120  Symptoms of Hypoglycemia? no  Comorbid Symptoms: no Chest pain; no SOB; no Neuropathy: no Vision problems  Medication Compliance: yes   Medication Side Effects: none  Counseling  Diet pattern: Better; Occasional reg soda and Gatorade. No fruit juice  Exercise: Exercise bike 5 mins daily  Smoking status: no    Lab Results  Component Value Date   HGBA1C 11.7 06/30/2014   CHRONIC HYPERTENSION BP Readings from Last 3 Encounters:  09/01/14 118/69  07/31/14 119/79  07/17/14 124/73    Disease Monitoring  Chest pain: no   Dyspnea: no   Claudication: no  Medication Side Effects: Denies cough, angioedema,lightheadedness, rash   HLD Lab Results  Component Value Date   LDLCALC 104* 03/30/2014    Medication Compliance: yes  Side Effects?: no muscle pain or weakness, RUQ pain; jaundice  Medication compliance: yes   Left Hip Pain - worse with exercise - Had previous steroid injection that gave her relief for several months - Was advised by her orthopedic surgeon not to get further steroids prior to surgery - Taking Aleve twice a day  ROS: See HPI   Medical & Surgical Hx:  Reviewed  Medications & Allergies: Reviewed  Social History: Reviewed:   Objective Findings:  Vitals: BP 118/69 mmHg  Pulse 62  Temp(Src) 98 F (36.7 C) (Oral)  Wt 179 lb 12.8 oz (81.557 kg)  Gen: NAD CV: RRR w/o m/r/g, pulses +2 b/l Resp: CTAB w/ normal respiratory effort Lower Ext: No skin changes; No edema; distal pulses intact; Calves nontender  Assessment & Plan:   Please See Problem Focused Assessment & Plan

## 2014-09-01 NOTE — Assessment & Plan Note (Signed)
>>  ASSESSMENT AND PLAN FOR UNCONTROLLED TYPE 2 DIABETES MELLITUS WITH HYPERGLYCEMIA (HCC) WRITTEN ON 09/01/2014 11:59 AM BY Wenda Low R, MD  Blood glucose is improving range 90-160 - Recommended checking blood sugars every morning and 2 hours after largest meal - Follow-up with Dr. Raymondo Band and 1-2 weeks - She is hopeful that her next A1c in 1 month will be less than 8, so she can undergo a left hip replacement - Advised discontinuing all regular sodas and Gatorade - Follow-up with PCP in one month

## 2014-09-02 LAB — HIV ANTIBODY (ROUTINE TESTING W REFLEX): HIV 1&2 Ab, 4th Generation: NONREACTIVE

## 2014-09-04 ENCOUNTER — Other Ambulatory Visit: Payer: Self-pay | Admitting: Family Medicine

## 2014-09-10 ENCOUNTER — Other Ambulatory Visit: Payer: Self-pay | Admitting: Family Medicine

## 2014-09-14 ENCOUNTER — Ambulatory Visit: Payer: Medicaid Other | Admitting: Pharmacist

## 2014-09-18 ENCOUNTER — Other Ambulatory Visit: Payer: Self-pay | Admitting: Family Medicine

## 2014-09-18 ENCOUNTER — Ambulatory Visit: Payer: Medicaid Other | Admitting: Pharmacist

## 2014-09-28 ENCOUNTER — Encounter: Payer: Self-pay | Admitting: Pharmacist

## 2014-09-28 ENCOUNTER — Ambulatory Visit (INDEPENDENT_AMBULATORY_CARE_PROVIDER_SITE_OTHER): Payer: Medicaid Other | Admitting: Pharmacist

## 2014-09-28 DIAGNOSIS — E1165 Type 2 diabetes mellitus with hyperglycemia: Secondary | ICD-10-CM | POA: Diagnosis present

## 2014-09-28 DIAGNOSIS — M25552 Pain in left hip: Secondary | ICD-10-CM

## 2014-09-28 DIAGNOSIS — IMO0002 Reserved for concepts with insufficient information to code with codable children: Secondary | ICD-10-CM

## 2014-09-28 LAB — POCT GLYCOSYLATED HEMOGLOBIN (HGB A1C): HEMOGLOBIN A1C: 9.2

## 2014-09-28 MED ORDER — INSULIN ASPART 100 UNIT/ML FLEXPEN: 15.0000 [IU] | PEN_INJECTOR | Freq: Two times a day (BID) | SUBCUTANEOUS | Status: DC

## 2014-09-28 NOTE — Progress Notes (Signed)
Patient ID: Holly Acosta, female   DOB: [birth year], 59 y.o.   MRN: [id number] Reviewed: Agree with Dr. Graylin Shiver documentation and management.

## 2014-09-28 NOTE — Assessment & Plan Note (Signed)
Pain:  Patient is currently taking Aleve 2 tablets approximately three times per week.  Patient is also currently taking lisinopril for HTN.  Noted last BMET showed an increase in Scr from 0.98 to 1.35.  Counseled patient to stop taking Aleve or any NSAIDs.  Patient to try taking Tylenol ER 650mg  TID and to take an additional capsule as needed up to 2 capsules TID.  Ordered follow up BMET today.

## 2014-09-28 NOTE — Assessment & Plan Note (Signed)
Diabetes diagnosed in 2009 currently improved with A1c today of 9.2%.   Denies hypoglycemic events and is able to verbalize appropriate hypoglycemia management plan.  Reports adherence with medication. Control is suboptimal due to subtherapeutic bolus insulin dosing, pain and stress.  Continued basal insulin Lantus (insulin glargine) 50 units daily. Increased dose of rapid insulin Novolog (insulin aspart) to 15 units before meals.  Reviewed symptoms and management of hypoglycemia.  Instructed patient to monitor blood glucose BID in the morning and at bedtime.  Patient to bring her meter to the next visit.  Next A1C anticipated in 3 months.

## 2014-09-28 NOTE — Progress Notes (Signed)
S:    Patient arrives in good spirit, walking with a cane.  Presents for diabetes follow up.  Patient reports having history of Diabetes since the year of 2009.   Patient reports adherence with medications. Current diabetes medications include Lantus 50 units daily, Novolog 10 units before meals, and metformin 1000mg  BID.    Patient denies hypoglycemic events.   Patient denies nocturia. She reports nocturia has decreased from 3-4 times per night to one episode per night.  Patient denies neuropathy. Patient denies visual changes.   O:  Lab Results  Component Value Date   HGBA1C 11.7 06/30/2014     Home fasting CBG: 100-120 2 hour post-prandial/random CBG: 120-150  A/P: Diabetes diagnosed in 2009 currently improved with A1c today of 9.2%.   Denies hypoglycemic events and is able to verbalize appropriate hypoglycemia management plan.  Reports adherence with medication. Control is suboptimal due to subtherapeutic bolus insulin dosing, pain and stress.  Continued basal insulin Lantus (insulin glargine) 50 units daily. Increased dose of rapid insulin Novolog (insulin aspart) to 15 units before meals.  Reviewed symptoms and management of hypoglycemia.  Instructed patient to monitor blood glucose BID in the morning and at bedtime.  Patient to bring her meter to the next visit.  Next A1C anticipated in 3 months.    Pain:  Patient is currently taking Aleve 2 tablets approximately three times per week.  Patient is also currently taking lisinopril for HTN.  Noted last BMET showed an increase in Scr from 0.98 to 1.35.  Counseled patient to stop taking Aleve or any NSAIDs.  Patient to try taking Tylenol ER 650mg  TID and to take an additional capsule as needed up to 2 capsules TID.  Ordered follow up BMET today.    Written patient instructions provided.  Follow up with Dr. Berkley Harvey on 10/04/14.  Follow up in Pharmacist Clinic Visit in two weeks.   Total time in face to face counseling 30 minutes.  Patient seen  with Roxy Manns, PharmD Candidate, Bennye Alm, PharmD Resident and Elisabeth Most, PharmD, resident.  Marland Kitchen

## 2014-09-28 NOTE — Patient Instructions (Addendum)
Increase Novolog to 15 units before meals.  Check blood sugar in the morning and at bedtime.  Bring your meter to your next visit.    Stop using Aleve for hip pain.  Try taking Tylenol extended release 650mg  (1 capsule) three times daily.  You can increase to 2 capsules three times daily if needed.    Follow up with me in 2 weeks.

## 2014-09-28 NOTE — Assessment & Plan Note (Signed)
>>  ASSESSMENT AND PLAN FOR UNCONTROLLED TYPE 2 DIABETES MELLITUS WITH HYPERGLYCEMIA (HCC) WRITTEN ON 09/28/2014 12:19 PM BY KOVAL, PETER G, RPH  Diabetes diagnosed in 2009 currently improved with A1c today of 9.2%.   Denies hypoglycemic events and is able to verbalize appropriate hypoglycemia management plan.  Reports adherence with medication. Control is suboptimal due to subtherapeutic bolus insulin dosing, pain and stress.  Continued basal insulin Lantus (insulin glargine) 50 units daily. Increased dose of rapid insulin Novolog (insulin aspart) to 15 units before meals.  Reviewed symptoms and management of hypoglycemia.  Instructed patient to monitor blood glucose BID in the morning and at bedtime.  Patient to bring her meter to the next visit.  Next A1C anticipated in 3 months.

## 2014-09-29 LAB — BASIC METABOLIC PANEL
BUN: 24 mg/dL (ref 7–25)
CALCIUM: 9.7 mg/dL (ref 8.6–10.4)
CHLORIDE: 103 mmol/L (ref 98–110)
CO2: 22 mmol/L (ref 20–31)
CREATININE: 1.51 mg/dL — AB (ref 0.50–1.05)
Glucose, Bld: 88 mg/dL (ref 65–99)
Potassium: 4.3 mmol/L (ref 3.5–5.3)
Sodium: 137 mmol/L (ref 135–146)

## 2014-10-04 ENCOUNTER — Ambulatory Visit: Payer: Medicaid Other | Admitting: Family Medicine

## 2014-10-12 ENCOUNTER — Encounter: Payer: Self-pay | Admitting: Family Medicine

## 2014-10-12 ENCOUNTER — Ambulatory Visit: Payer: Medicaid Other | Admitting: Pharmacist

## 2014-10-14 ENCOUNTER — Other Ambulatory Visit: Payer: Self-pay | Admitting: Family Medicine

## 2014-10-25 ENCOUNTER — Other Ambulatory Visit: Payer: Self-pay | Admitting: Family Medicine

## 2014-10-26 ENCOUNTER — Ambulatory Visit (INDEPENDENT_AMBULATORY_CARE_PROVIDER_SITE_OTHER): Payer: Medicaid Other | Admitting: Family Medicine

## 2014-10-26 VITALS — BP 122/73 | HR 69 | Temp 97.7°F | Ht 65.0 in | Wt 182.0 lb

## 2014-10-26 DIAGNOSIS — Z794 Long term (current) use of insulin: Secondary | ICD-10-CM | POA: Diagnosis not present

## 2014-10-26 DIAGNOSIS — M25552 Pain in left hip: Secondary | ICD-10-CM

## 2014-10-26 DIAGNOSIS — E1165 Type 2 diabetes mellitus with hyperglycemia: Secondary | ICD-10-CM | POA: Diagnosis present

## 2014-10-26 DIAGNOSIS — Z23 Encounter for immunization: Secondary | ICD-10-CM

## 2014-10-26 DIAGNOSIS — I10 Essential (primary) hypertension: Secondary | ICD-10-CM

## 2014-10-26 LAB — BASIC METABOLIC PANEL WITH GFR
BUN: 23 mg/dL (ref 7–25)
CHLORIDE: 102 mmol/L (ref 98–110)
CO2: 26 mmol/L (ref 20–31)
CREATININE: 1.07 mg/dL — AB (ref 0.50–1.05)
Calcium: 9.7 mg/dL (ref 8.6–10.4)
GFR, Est African American: 66 mL/min (ref >=60)
GFR, Est Non African American: 57 mL/min — ABNORMAL LOW (ref >=60)
Glucose, Bld: 89 mg/dL (ref 65–99)
Potassium: 4.1 mmol/L (ref 3.5–5.3)
Sodium: 137 mmol/L (ref 135–146)

## 2014-10-26 MED ORDER — TRAMADOL HCL 50 MG PO TABS
50.0000 mg | ORAL_TABLET | Freq: Four times a day (QID) | ORAL | Status: DC | PRN
Start: 2014-10-26 — End: 2014-12-20

## 2014-10-26 NOTE — Assessment & Plan Note (Signed)
>>  ASSESSMENT AND PLAN FOR HYPERTENSION, BENIGN ESSENTIAL WRITTEN ON 10/26/2014  3:06 PM BY Wenda Low R, MD  Blood pressure well controlled today. - Recheck BMP; creatinine elevated at last check - was taking NSAIDs often, but has now stopped - Consider discontinuing metformin if creatinine increases - f/u in pharm clinic in 2 weeks

## 2014-10-26 NOTE — Assessment & Plan Note (Signed)
>>  ASSESSMENT AND PLAN FOR UNCONTROLLED TYPE 2 DIABETES MELLITUS WITH HYPERGLYCEMIA (HCC) WRITTEN ON 10/26/2014  3:08 PM BY Wenda Low R, MD  Not checking BG regularly, but reports highest BG 169 and no hypoglycemia. Only missed Lantus once and Novolog once in past week.  - Advised checking AM BGs daily and 2 hour post-prandial several times over the next 2 weeks.  - f/u in pharm clinic in 2 weeks; Advised to call with BGs if unable to make that appointment

## 2014-10-26 NOTE — Assessment & Plan Note (Signed)
Asked her to discussed steroids hip injection with her orthopedist this has provided relief in the past - Decrease Tylenol to 650 QID  - Start Ultram 50mg  every 6 hours prn; advised to discuss this with her orthopedist

## 2014-10-26 NOTE — Assessment & Plan Note (Signed)
Blood pressure well controlled today. - Recheck BMP; creatinine elevated at last check - was taking NSAIDs often, but has now stopped - Consider discontinuing metformin if creatinine increases - f/u in pharm clinic in 2 weeks

## 2014-10-26 NOTE — Progress Notes (Signed)
  Patient name: Holly Acosta MRN [id number]  Date of birth: 1955/02/20  CC & HPI:  Holly Acosta is a 59 y.o. female presenting today for Diabetes and Hip pain  DIABETES   Blood Sugar Ranges: Not checking frequently.  Reports highest 169  Symptoms of Hypoglycemia? No  Comorbid Symptoms: no Chest pain; no SOB; no Neuropathy: no Vision problems  Medication Compliance: mostly, reports missing Lantus and NovoLog once each the past week   Medication Side Effects: denies hypoglycemia  Left hip pain - Reports 10 out of 10 left hip pain without medication; pain improves to 8 out of 10 with Tylenol 650 4 times a day - Has appointment with orthopedist in the next 2 weeks to discuss hip replacement - Has had previous hip steroids injection with improvement in pain for approximately 3 months - Has avoided NSAIDs as recommended due to increasing creatinine and concurrent lisinopril  ROS: See HPI   Medical & Surgical Hx:  Reviewed  Medications & Allergies: Reviewed  Social History: Reviewed:   Objective Findings:  Vitals: BP 122/73 mmHg  Pulse 69  Temp(Src) 97.7 F (36.5 C) (Oral)  Ht 5\' 5"  (1.651 m)  Wt 182 lb (82.555 kg)  BMI 30.29 kg/m2  Gen: NAD CV: RRR w/o m/r/g, pulses +2 b/l Resp: CTAB w/ normal respiratory effort Lower Ext: No skin changes; trace edema; distal pulses intact; Calves nontender  Assessment & Plan:   HYPERTENSION, BENIGN ESSENTIAL Blood pressure well controlled today. - Recheck BMP; creatinine elevated at last check - was taking NSAIDs often, but has now stopped - Consider discontinuing metformin if creatinine increases - f/u in pharm clinic in 2 weeks  DM (diabetes mellitus), type 2, uncontrolled Not checking BG regularly, but reports highest BG 169 and no hypoglycemia. Only missed Lantus once and Novolog once in past week.  - Advised checking AM BGs daily and 2 hour post-prandial several times over the next 2 weeks.  - f/u in pharm clinic in 2 weeks;  Advised to call with BGs if unable to make that appointment  Left hip pain Asked her to discussed steroids hip injection with her orthopedist this has provided relief in the past - Decrease Tylenol to 650 QID  - Start Ultram 50mg  every 6 hours prn; advised to discuss this with her orthopedist

## 2014-10-26 NOTE — Assessment & Plan Note (Signed)
Not checking BG regularly, but reports highest BG 169 and no hypoglycemia. Only missed Lantus once and Novolog once in past week.  - Advised checking AM BGs daily and 2 hour post-prandial several times over the next 2 weeks.  - f/u in pharm clinic in 2 weeks; Advised to call with BGs if unable to make that appointment

## 2014-10-26 NOTE — Patient Instructions (Signed)
It was great seeing you today.   1. Continue taking metformin 1000 mg twice a day.  Lantus 50 units daily.  NovoLog 15 units with every meal 2. Check her blood sugar first thing in the morning every day, and checked her blood sugar 2 hours after eating several times a week.  3. We will recheck your kidney function today and I'll call you with those results.  4. Take Ultram 50 mg every 6 hours as needed for pain 5. Discuss your diabetes with your orthopedic surgeon and find out what degree of control they will require prior to doing your hip.  6. 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  Next Appointment  Please make an appointment with Dr Valentina Lucks in 2 weeks for Diabetes   I look forward to talking with you again at our next visit. If you have any questions or concerns before then, please call the clinic at [phone].  Take Care,   Dr Phill Myron

## 2014-10-30 ENCOUNTER — Ambulatory Visit: Payer: Medicaid Other | Admitting: Pharmacist

## 2014-11-13 ENCOUNTER — Ambulatory Visit: Payer: Medicaid Other | Admitting: Pharmacist

## 2014-11-14 ENCOUNTER — Other Ambulatory Visit: Payer: Self-pay | Admitting: *Deleted

## 2014-11-14 MED ORDER — METFORMIN HCL 1000 MG PO TABS: ORAL_TABLET | ORAL | Status: DC

## 2014-11-16 ENCOUNTER — Encounter: Payer: Self-pay | Admitting: Pharmacist

## 2014-11-16 ENCOUNTER — Ambulatory Visit (INDEPENDENT_AMBULATORY_CARE_PROVIDER_SITE_OTHER): Payer: Medicaid Other | Admitting: Pharmacist

## 2014-11-16 VITALS — BP 113/73 | HR 66 | Ht 65.0 in | Wt 179.0 lb

## 2014-11-16 DIAGNOSIS — E1165 Type 2 diabetes mellitus with hyperglycemia: Secondary | ICD-10-CM | POA: Diagnosis present

## 2014-11-16 DIAGNOSIS — Z794 Long term (current) use of insulin: Secondary | ICD-10-CM

## 2014-11-16 DIAGNOSIS — I10 Essential (primary) hypertension: Secondary | ICD-10-CM

## 2014-11-16 DIAGNOSIS — IMO0001 Reserved for inherently not codable concepts without codable children: Secondary | ICD-10-CM

## 2014-11-16 LAB — BASIC METABOLIC PANEL
BUN: 30 mg/dL — AB (ref 7–25)
CHLORIDE: 99 mmol/L (ref 98–110)
CO2: 24 mmol/L (ref 20–31)
Calcium: 10 mg/dL (ref 8.6–10.4)
Creat: 1.41 mg/dL — ABNORMAL HIGH (ref 0.50–1.05)
Glucose, Bld: 71 mg/dL (ref 65–99)
POTASSIUM: 4.3 mmol/L (ref 3.5–5.3)
Sodium: 136 mmol/L (ref 135–146)

## 2014-11-16 LAB — POCT GLYCOSYLATED HEMOGLOBIN (HGB A1C): Hemoglobin A1C: 8.8

## 2014-11-16 MED ORDER — INSULIN ASPART 100 UNIT/ML FLEXPEN: 20.0000 [IU] | PEN_INJECTOR | Freq: Two times a day (BID) | SUBCUTANEOUS | Status: DC

## 2014-11-16 NOTE — Assessment & Plan Note (Signed)
Blood pressure at goal. Decreased HCTZ to 12.5mg  daily. Patient has a good supply of the 25mg  tablets at home. She was instructed to cut them in half until she runs out. If she runs out prior to her next visit she will call office and request a new prescription.

## 2014-11-16 NOTE — Progress Notes (Signed)
Patient ID: Holly Acosta, female   DOB: February 15, 1955, 59 y.o.   MRN: [id number] Reviewed: Agree with Dr. Graylin Shiver documentation and management.

## 2014-11-16 NOTE — Patient Instructions (Addendum)
You're A1c today was 8.8. Continue to work on your diet to reach your goal.  We will decrease your HCTZ to 12.5 mg daily because your blood pressure today was at goal. You can cut your current 25 mg tablets in half to use up your home supply. Please call the office if you are running low.  Increase your Novolog to 20 units twice a day with breakfast and supper. Continue taking your Lantus 50 units once a day.   Please remember to check your blood sugars at least once a day and bring your meter to your next appointment.  Schedule a follow up visit with Dr. Valentina Lucks in 2 weeks.

## 2014-11-16 NOTE — Assessment & Plan Note (Signed)
>>  ASSESSMENT AND PLAN FOR HYPERTENSION, BENIGN ESSENTIAL WRITTEN ON 11/16/2014  4:42 PM BY KOVAL, PETER G, RPH  Blood pressure at goal. Decreased HCTZ to 12.5mg  daily. Patient has a good supply of the 25mg  tablets at home. She was instructed to cut them in half until she runs out. If she runs out prior to her next visit she will call office and request a new prescription.

## 2014-11-16 NOTE — Progress Notes (Signed)
S:    Patient arrives in good spirits with an cane, moving through clinic by wheelchair.  Presents for diabetes follow-up. Patient reports Diabetes for about 5 years.  Patient reports adherence with medications. Current diabetes medications include Novolog, Lantus, Metformin  Patient denies hypoglycemic events.  Patient reported dietary habits: Breakfast: egg, sausage, Lunch: skips, Dinner: Chicken, Greens, Mashed Potatoes, Snack: Crackers and Peanut Butter, Drinks: Water mostly  Patient reported exercise habits: unable to exercise due to hip pain, improving, attempting to walk for 5 minutes at a time around the house or on a bike.   Patient denies nocturia.  Patient denies neuropathy. Patient denies visual changes. Patient reports self foot exams.   She reports visiting with an orthopedic surgeon who will not intervene on her left hip unless her A1c is less than 8. She denies pain at today's visit, she has just taken a tramadol.   O:  Lab Results  Component Value Date   HGBA1C 8.8 11/16/2014   BP 113/73 mmHg Home fasting CBG: 94 -  235 (only 3 checks in last 30 days)  A/P: Diabetes diagnosed about 5 years ago currently uncontrolled.   Patient denies hypoglycemic events and is able to verbalize appropriate hypoglycemia management plan.  Patient reports adherence with medication. Control is suboptimal due to diet and immobility. She has hip pain that will need either steriod injection or surgery. Orthopedics would like her A1c to be less than 8 prior to any intervention.  Continued basal insulin Lantus (insulin glargine) 50 units daily. Increased dose of rapid insulin Novolog (insulin aspart) to 20 units twice a day at breakfast and supper. Encouraged her to eat a good diet and check blood glucose consistently. Next A1C anticipated December 2016.    Blood pressure at goal. Decreased HCTZ to 12.5mg  daily. Patient has a good supply of the 25mg  tablets at home. She was instructed to cut  them in half until she runs out. If she runs out prior to her next visit she will call office and request a new prescription.  Written patient instructions provided.  Total time in face to face counseling 35 minutes.   Follow up in Pharmacist Clinic Visit in 2 weeks.   Patient seen with Viann Fish, PharmD Candidate, Dimitri Ped, PharmD Resident.and Elisabeth Most, PharmD, resident.

## 2014-11-16 NOTE — Assessment & Plan Note (Signed)
>>  ASSESSMENT AND PLAN FOR UNCONTROLLED TYPE 2 DIABETES MELLITUS WITH HYPERGLYCEMIA (HCC) WRITTEN ON 11/16/2014  4:42 PM BY KOVAL, PETER G, RPH  Diabetes diagnosed about 5 years ago currently uncontrolled.   Patient denies hypoglycemic events and is able to verbalize appropriate hypoglycemia management plan.  Patient reports adherence with medication. Control is suboptimal due to diet and immobility. She has hip pain that will need either steriod injection or surgery. Orthopedics would like her A1c to be less than 8 prior to any intervention.  Continued basal insulin Lantus (insulin glargine) 50 units daily. Increased dose of rapid insulin Novolog (insulin aspart) to 20 units twice a day at breakfast and supper. Encouraged her to eat a good diet and check blood glucose consistently. Next A1C anticipated December 2016.

## 2014-11-16 NOTE — Assessment & Plan Note (Signed)
Diabetes diagnosed about 5 years ago currently uncontrolled.   Patient denies hypoglycemic events and is able to verbalize appropriate hypoglycemia management plan.  Patient reports adherence with medication. Control is suboptimal due to diet and immobility. She has hip pain that will need either steriod injection or surgery. Orthopedics would like her A1c to be less than 8 prior to any intervention.  Continued basal insulin Lantus (insulin glargine) 50 units daily. Increased dose of rapid insulin Novolog (insulin aspart) to 20 units twice a day at breakfast and supper. Encouraged her to eat a good diet and check blood glucose consistently. Next A1C anticipated December 2016.

## 2014-11-26 ENCOUNTER — Other Ambulatory Visit: Payer: Self-pay | Admitting: Family Medicine

## 2014-11-27 NOTE — Telephone Encounter (Signed)
Refill request from pharmacy. Will forward to PCP for review. Raffi Milstein, CMA. 

## 2014-12-01 ENCOUNTER — Ambulatory Visit: Payer: Medicaid Other | Admitting: Pharmacist

## 2014-12-06 ENCOUNTER — Other Ambulatory Visit: Payer: Self-pay | Admitting: Family Medicine

## 2014-12-12 ENCOUNTER — Encounter: Payer: Self-pay | Admitting: Pharmacist

## 2014-12-12 ENCOUNTER — Ambulatory Visit (INDEPENDENT_AMBULATORY_CARE_PROVIDER_SITE_OTHER): Payer: Medicaid Other | Admitting: Pharmacist

## 2014-12-12 VITALS — BP 107/66 | HR 74 | Ht 65.0 in | Wt 178.4 lb

## 2014-12-12 DIAGNOSIS — I10 Essential (primary) hypertension: Secondary | ICD-10-CM

## 2014-12-12 DIAGNOSIS — IMO0001 Reserved for inherently not codable concepts without codable children: Secondary | ICD-10-CM

## 2014-12-12 DIAGNOSIS — E1165 Type 2 diabetes mellitus with hyperglycemia: Secondary | ICD-10-CM

## 2014-12-12 NOTE — Assessment & Plan Note (Signed)
Diabetes diagnosed in 2012 currently uncontrolled.   Patient denies hypoglycemic events and is able to verbalize appropriate hypoglycemia management plan.  Patient reports adherence with medication. Control is suboptimal due to pain. Continue dose of lantus 50 units daily and novolog 20 units before breakfast and dinner. Patient's meter broke, and she was given a new Accu-chek meter in clinic.  She was asked to check 2 hours post-largest meal of the day to assess control.  Next A1C anticipated 2-3 months.

## 2014-12-12 NOTE — Patient Instructions (Addendum)
Thank you for coming in today!  Because your blood pressure has been low, we are going to STOP your hydrochlorothiazide. We have given you a new blood sugar meter. Please check your blood sugars twice a day (once in the morning and 2 hours after your largest meal) and bring your meter to your next appointment. Try to work on doing low weight bearing exercises that you can tolerate.  Keep up the good work!  Follow up with pharmacy clinic on December 15th

## 2014-12-12 NOTE — Assessment & Plan Note (Signed)
Chronic Hypertension requiring multiple drug regimen for control.  Blood pressure today was 107/66.  Given improved control the potential for higher adherence rate to medicines and poor past adherence is concerning  Discontinue HCTZ.  Revaluate blood pressure control at next visit.

## 2014-12-12 NOTE — Assessment & Plan Note (Signed)
>>  ASSESSMENT AND PLAN FOR UNCONTROLLED TYPE 2 DIABETES MELLITUS WITH HYPERGLYCEMIA (HCC) WRITTEN ON 12/12/2014  5:26 PM BY KOVAL, Lubertha Basque, RPH  Diabetes diagnosed in 2012 currently uncontrolled.   Patient denies hypoglycemic events and is able to verbalize appropriate hypoglycemia management plan.  Patient reports adherence with medication. Control is suboptimal due to pain. Continue dose of lantus 50 units daily and novolog 20 units before breakfast and dinner. Patient's meter broke, and she was given a new Accu-chek meter in clinic.  She was asked to check 2 hours post-largest meal of the day to assess control.  Next A1C anticipated 2-3 months.

## 2014-12-12 NOTE — Assessment & Plan Note (Signed)
>>  ASSESSMENT AND PLAN FOR HYPERTENSION, BENIGN ESSENTIAL WRITTEN ON 12/12/2014  5:28 PM BY KOVAL, PETER G, RPH  Chronic Hypertension requiring multiple drug regimen for control.  Blood pressure today was 107/66.  Given improved control the potential for higher adherence rate to medicines and poor past adherence is concerning  Discontinue HCTZ.  Revaluate blood pressure control at next visit.

## 2014-12-12 NOTE — Progress Notes (Signed)
S:    Patient arrives in good spirits in a wheelchair being pushed by her grandson 69.  Presents for diabetes follow up Patient reports Diabetes was diagnosed a few years ago ~4 years.  Patient reports that she needs her A1c to be <8 in order to have hip surgery. Taking tramadol for pain 1-2 times per day.  Patient reports adherence with medications. Current diabetes medications include Lantus 50 units daily and novolog 20 units before breakfast and dinner.  Patient denies hypoglycemic events.  Patient reported dietary habits: has worked on decreasing bread. Drinks water  Patient reported exercise habits: Having a hard time exercising due to hip and knee pain. Will work on doing more non-weight bearing exercises.   Patient denies nocturia.  Patient denies neuropathy. Patient denies visual changes. Patient reports self foot exams.    O:  Lab Results  Component Value Date   HGBA1C 8.8 11/16/2014    Home fasting CBG: 140s  2 hour post-prandial/random CBG: 140-160s.  A/P: Diabetes diagnosed in 2012 currently uncontrolled.   Patient denies hypoglycemic events and is able to verbalize appropriate hypoglycemia management plan.  Patient reports adherence with medication. Control is suboptimal due to pain. Continue dose of lantus 50 units daily and novolog 20 units before breakfast and dinner. Patient's meter broke, and she was given a new Accu-chek meter in clinic.  She was asked to check 2 hours post-largest meal of the day to assess control.  Next A1C anticipated 2-3 months.    Chronic Hypertension requiring multiple drug regimen for control.  Blood pressure today was 107/66.  Discontinue HCTZ.  Revaluate blood pressure control at next visit.   Written patient instructions provided.  Total time in face to face counseling 40 minutes.   Follow up in Pharmacist Clinic Visit December 15th.  Patient seen with Liliane Shi, PharmD Candidate, and Angela Burke, PharmD Resident. Marland Kitchen

## 2014-12-13 NOTE — Progress Notes (Signed)
Patient ID: Holly Acosta, female   DOB: 08/13/1955, 59 y.o.   MRN: [id number] Reviewed: Agree with Dr. Koval's documentation and management.  

## 2014-12-20 ENCOUNTER — Other Ambulatory Visit: Payer: Self-pay | Admitting: *Deleted

## 2014-12-20 DIAGNOSIS — M25552 Pain in left hip: Secondary | ICD-10-CM

## 2014-12-22 NOTE — Telephone Encounter (Signed)
2nd request.  Charletta Voight L, RN  

## 2014-12-28 MED ORDER — TRAMADOL HCL 50 MG PO TABS: 50.0000 mg | ORAL_TABLET | Freq: Four times a day (QID) | ORAL | Status: DC | PRN

## 2014-12-28 NOTE — Telephone Encounter (Signed)
Pt is aware of this and her daughter Drexel Iha will be picking up her script.  She plans to be in the car. Jazmin Hartsell,CMA

## 2014-12-28 NOTE — Telephone Encounter (Signed)
Please call and let her know her Rx for Ultram is at the front desk. She needs to make an appointment with me prior to additional refills.

## 2015-01-04 ENCOUNTER — Encounter: Payer: Self-pay | Admitting: Pharmacist

## 2015-01-04 ENCOUNTER — Ambulatory Visit (INDEPENDENT_AMBULATORY_CARE_PROVIDER_SITE_OTHER): Payer: Medicaid Other | Admitting: Pharmacist

## 2015-01-04 VITALS — BP 134/74 | HR 70 | Wt 179.0 lb

## 2015-01-04 DIAGNOSIS — IMO0002 Reserved for concepts with insufficient information to code with codable children: Secondary | ICD-10-CM

## 2015-01-04 DIAGNOSIS — E1165 Type 2 diabetes mellitus with hyperglycemia: Secondary | ICD-10-CM | POA: Diagnosis not present

## 2015-01-04 DIAGNOSIS — E1169 Type 2 diabetes mellitus with other specified complication: Secondary | ICD-10-CM

## 2015-01-04 DIAGNOSIS — I10 Essential (primary) hypertension: Secondary | ICD-10-CM

## 2015-01-04 MED ORDER — GLUCOSE BLOOD VI STRP: ORAL_STRIP | Status: DC

## 2015-01-04 NOTE — Assessment & Plan Note (Signed)
Blood pressure at goal today in clinic.  Continued current regimen of amlodipine 10mg  daily, carvedilol 12.5mg  BID, lisinopril 40mg  daily, and spironolactone 25mg  daily.

## 2015-01-04 NOTE — Progress Notes (Signed)
S:    Patient arrives in good spirits, in a wheelchair pushed by her granddaughter.  Presents for diabetes follow up.   Patient reports Diabetes was diagnosed a few years ago ~4 years.  Patient reports that she needs her A1c to be <8 in order to have hip surgery.  Taking tramadol for pain 1-2 times per day.     Patient reports adherence with medications. Current diabetes medications include metformin 1000mg  BID, Lantus 50 units daily, and Novolog 20 units before meals (patient reports taking once daily).    Patient denies hypoglycemic events.  Patient reported dietary habits: patient reports only eating one main meal per day, she reports eating a smaller meal at a second time of the day, patient reports snacking some during day and reports bread is a weakness   Patient reported exercise habits: exercise limited due to hip and knee pain   Noted patient has had gradual weight loss since June 2016 which correlates with improvement in A1c.     O:  Lab Results  Component Value Date   HGBA1C 8.8 11/16/2014    Home fasting CBG: 130-170 mg/dL  2 hour post-prandial/random CBG: 170s   A/P: Diabetes diagnosed in 2012 currently uncontrolled.  Patient denies hypoglycemic events and is able to verbalize appropriate hypoglycemia management plan.  Patient reports adherence with medication. Control is suboptimal due to pain.  Continued basal insulin Lantus (insulin glargine) 50 units daily.  Increased dose of rapid insulin Novolog (insulin aspart) to 20 units twice daily.  Counseled patient to monitor blood glucose 2 hours post meal to assess control.  Counseled patient to call clinic if she noticed blood glucose dropping below 80 mg/dL.  Counseled patient on diet and healthy choices for snacks.  Next A1C anticipated in one month.    Blood pressure at goal today in clinic.  Continued current regimen of amlodipine 10mg  daily, carvedilol 12.5mg  BID, lisinopril 40mg  daily, and spironolactone 25mg  daily.     Written patient instructions provided.  Total time in face to face counseling 30 minutes.   Follow up in Pharmacist Clinic Visit in early January.   Patient seen with Elisabeth Most, PharmD Resident.

## 2015-01-04 NOTE — Patient Instructions (Signed)
Thank you for coming in today!   Continue Lantus 50 units daily.  Take Novolog 20 units twice a day before your meals.    Check your blood sugar two hours after your meal to assess control.    Call clinic if you start having blood sugars less than 80.    Schedule a follow up appointment with Dr. Valentina Lucks in early January.

## 2015-01-04 NOTE — Assessment & Plan Note (Signed)
Diabetes diagnosed in 2012 currently uncontrolled.  Patient denies hypoglycemic events and is able to verbalize appropriate hypoglycemia management plan.  Patient reports adherence with medication. Control is suboptimal due to pain.  Continued basal insulin Lantus (insulin glargine) 50 units daily.  Increased dose of rapid insulin Novolog (insulin aspart) to 20 units twice daily.  Counseled patient to monitor blood glucose 2 hours post meal to assess control.  Counseled patient to call clinic if she noticed blood glucose dropping below 80 mg/dL.  Counseled patient on diet and healthy choices for snacks.  Next A1C anticipated in one month.

## 2015-01-04 NOTE — Assessment & Plan Note (Signed)
>>  ASSESSMENT AND PLAN FOR HYPERTENSION, BENIGN ESSENTIAL WRITTEN ON 01/04/2015  2:17 PM BY KOVAL, PETER G, RPH  Blood pressure at goal today in clinic.  Continued current regimen of amlodipine 10mg  daily, carvedilol 12.5mg  BID, lisinopril 40mg  daily, and spironolactone 25mg  daily.

## 2015-01-04 NOTE — Assessment & Plan Note (Signed)
>>  ASSESSMENT AND PLAN FOR UNCONTROLLED TYPE 2 DIABETES MELLITUS WITH HYPERGLYCEMIA (HCC) WRITTEN ON 01/04/2015  2:17 PM BY KOVAL, PETER G, RPH  Diabetes diagnosed in 2012 currently uncontrolled.  Patient denies hypoglycemic events and is able to verbalize appropriate hypoglycemia management plan.  Patient reports adherence with medication. Control is suboptimal due to pain.  Continued basal insulin Lantus (insulin glargine) 50 units daily.  Increased dose of rapid insulin Novolog (insulin aspart) to 20 units twice daily.  Counseled patient to monitor blood glucose 2 hours post meal to assess control.  Counseled patient to call clinic if she noticed blood glucose dropping below 80 mg/dL.  Counseled patient on diet and healthy choices for snacks.  Next A1C anticipated in one month.

## 2015-01-04 NOTE — Progress Notes (Signed)
Patient ID: Holly Acosta, female   DOB: 12/09/1955, 59 y.o.   MRN: [id number] Reviewed: Agree with Dr. Koval's documentation and management.  

## 2015-01-12 ENCOUNTER — Other Ambulatory Visit: Payer: Self-pay | Admitting: Family Medicine

## 2015-02-01 ENCOUNTER — Ambulatory Visit (INDEPENDENT_AMBULATORY_CARE_PROVIDER_SITE_OTHER): Payer: Medicaid Other | Admitting: Family Medicine

## 2015-02-01 ENCOUNTER — Encounter: Payer: Self-pay | Admitting: Family Medicine

## 2015-02-01 VITALS — BP 116/69 | HR 87 | Temp 98.3°F | Wt 180.4 lb

## 2015-02-01 DIAGNOSIS — I10 Essential (primary) hypertension: Secondary | ICD-10-CM | POA: Diagnosis not present

## 2015-02-01 DIAGNOSIS — E1169 Type 2 diabetes mellitus with other specified complication: Secondary | ICD-10-CM | POA: Diagnosis not present

## 2015-02-01 DIAGNOSIS — IMO0002 Reserved for concepts with insufficient information to code with codable children: Secondary | ICD-10-CM

## 2015-02-01 DIAGNOSIS — M25552 Pain in left hip: Secondary | ICD-10-CM

## 2015-02-01 DIAGNOSIS — E1165 Type 2 diabetes mellitus with hyperglycemia: Secondary | ICD-10-CM

## 2015-02-01 LAB — POCT GLYCOSYLATED HEMOGLOBIN (HGB A1C): HEMOGLOBIN A1C: 9.8

## 2015-02-01 NOTE — Progress Notes (Signed)
  Patient name: Holly Acosta MRN [id number]  Date of birth: 15-Jul-1955  CC & HPI:  Holly Acosta is a 60 y.o. female presenting today for DM, HTN, Hip pain.   DIABETES   Blood Sugar Ranges: 120 200s  Symptoms of Hypoglycemia? no  Comorbid Symptoms: no Chest pain; no SOB; no Neuropathy: no Vision problems  Medication Compliance: yes,  Lantus 50 units every morning.  NovoLog 20 units 1-2 times daily with meals   Medication Side Effects: no  hypoglycemia  CHRONIC HYPERTENSION  BP Readings from Last 3 Encounters:  02/01/15 116/69  01/04/15 134/74  12/12/14 107/66    Control: good  Chest pain: no   Dyspnea: no   Claudication: no  Medication compliance: yes  Medication Side Effects: no Dizziness/lightheadedness;   Preventitive Healthcare:   History  Smoking status  . Former Smoker  Smokeless tobacco  . Never Used   Hip pain -  Persistent left hip pain with difficulty ambulating -  Takes Tylenol as needed and Ultram for breakthrough pain -  Has appointment with orthopedic surgeon next month for evaluation for surgery   ROS: See HPI   Medical & Surgical Hx:  Reviewed  Medications & Allergies: Reviewed  Social History: Reviewed:   Objective Findings:  Vitals: BP 116/69 mmHg  Pulse 87  Temp(Src) 98.3 F (36.8 C) (Oral)  Wt 180 lb 6.4 oz (81.829 kg)  Gen: NAD; In wheelchair  CV: RRR w/o m/r/g, pulses +2 b/l Resp: CTAB w/ normal respiratory effort   Assessment & Plan:   HYPERTENSION, BENIGN ESSENTIAL  Blood pressure well controlled today.  Continue current regimen:  Amlodipine 10 mg daily, carvedilol 12.5 mg twice a day, lisinopril 40 mg daily, spironolactone 25mg  daily - f/u with Dr Valentina Lucks in 2-3 weeks  DM (diabetes mellitus), type 2, uncontrolled  Blood glucose readings seem to be improved without hypoglycemia.  -  Recheck hemoglobin A1c today -  Recheck bmet -  Will call with A1c results and adjust medications as needed.  Continue Lantus 50 units every  morning;  NovoLog 20 units 1-2 times daily with meals - f/u with Dr Valentina Lucks in 2-3 weeks.  Advised to bring blood sugar meter to that appointment  Left hip pain  Persistent left hip pain due to OA.  -  Appointment with orthopedic surgeon in one month for consideration of surgery -  Continue Tylenol when necessary and Ultram for breakthrough pain.  No refills provided today

## 2015-02-01 NOTE — Assessment & Plan Note (Signed)
Blood glucose readings seem to be improved without hypoglycemia.  -  Recheck hemoglobin A1c today -  Recheck bmet -  Will call with A1c results and adjust medications as needed.  Continue Lantus 50 units every morning;  NovoLog 20 units 1-2 times daily with meals - f/u with Dr Valentina Lucks in 2-3 weeks.  Advised to bring blood sugar meter to that appointment

## 2015-02-01 NOTE — Patient Instructions (Signed)
It was great seeing you today.   1.  No changes to her medications were made today.  I will call you with the results of your blood work, and discuss any medication changes needed.    Please bring all your medications to every doctors visit  Sign up for My Chart to have easy access to your labs results, and communication with your Primary care physician.  Next Appointment  Please make an appointment with Dr Valentina Lucks in 2-3 weeks for diabetes   I look forward to talking with you again at our next visit. If you have any questions or concerns before then, please call the clinic at [phone].  Take Care,   Dr Phill Myron

## 2015-02-01 NOTE — Assessment & Plan Note (Signed)
Blood pressure well controlled today.  Continue current regimen:  Amlodipine 10 mg daily, carvedilol 12.5 mg twice a day, lisinopril 40 mg daily, spironolactone 25mg  daily - f/u with Dr Valentina Lucks in 2-3 weeks

## 2015-02-01 NOTE — Addendum Note (Signed)
Addended by: Maryland Pink on: 02/01/2015 05:04 PM   Modules accepted: Orders

## 2015-02-01 NOTE — Assessment & Plan Note (Signed)
>>  ASSESSMENT AND PLAN FOR HYPERTENSION, BENIGN ESSENTIAL WRITTEN ON 02/01/2015  4:56 PM BY Jamal Collin, MD   Blood pressure well controlled today.  Continue current regimen:  Amlodipine 10 mg daily, carvedilol 12.5 mg twice a day, lisinopril 40 mg daily, spironolactone 25mg  daily - f/u with Dr Raymondo Band in 2-3 weeks

## 2015-02-01 NOTE — Assessment & Plan Note (Signed)
Persistent left hip pain due to OA.  -  Appointment with orthopedic surgeon in one month for consideration of surgery -  Continue Tylenol when necessary and Ultram for breakthrough pain.  No refills provided today

## 2015-02-01 NOTE — Assessment & Plan Note (Signed)
>>  ASSESSMENT AND PLAN FOR UNCONTROLLED TYPE 2 DIABETES MELLITUS WITH HYPERGLYCEMIA (HCC) WRITTEN ON 02/01/2015  4:58 PM BY JOYNER, JAMES R, MD   Blood glucose readings seem to be improved without hypoglycemia.  -  Recheck hemoglobin A1c today -  Recheck bmet -  Will call with A1c results and adjust medications as needed.  Continue Lantus 50 units every morning;  NovoLog 20 units 1-2 times daily with meals - f/u with Dr Raymondo Band in 2-3 weeks.  Advised to bring blood sugar meter to that appointment

## 2015-02-02 LAB — BASIC METABOLIC PANEL WITH GFR
BUN: 15 mg/dL (ref 7–25)
CO2: 25 mmol/L (ref 20–31)
Calcium: 9.9 mg/dL (ref 8.6–10.4)
Chloride: 105 mmol/L (ref 98–110)
Creat: 0.93 mg/dL (ref 0.50–1.05)
GFR, EST AFRICAN AMERICAN: 78 mL/min (ref >=60)
GFR, Est Non African American: 67 mL/min (ref >=60)
Glucose, Bld: 97 mg/dL (ref 65–99)
Potassium: 4.4 mmol/L (ref 3.5–5.3)
SODIUM: 140 mmol/L (ref 135–146)

## 2015-02-15 ENCOUNTER — Ambulatory Visit (INDEPENDENT_AMBULATORY_CARE_PROVIDER_SITE_OTHER): Payer: Medicaid Other | Admitting: Pharmacist

## 2015-02-15 ENCOUNTER — Encounter: Payer: Self-pay | Admitting: Pharmacist

## 2015-02-15 DIAGNOSIS — E1165 Type 2 diabetes mellitus with hyperglycemia: Secondary | ICD-10-CM

## 2015-02-15 DIAGNOSIS — E1169 Type 2 diabetes mellitus with other specified complication: Secondary | ICD-10-CM

## 2015-02-15 DIAGNOSIS — IMO0002 Reserved for concepts with insufficient information to code with codable children: Secondary | ICD-10-CM

## 2015-02-15 MED ORDER — GLUCOSE BLOOD VI STRP: ORAL_STRIP | Status: DC

## 2015-02-15 MED ORDER — ALBIGLUTIDE 30 MG ~~LOC~~ PEN
30.0000 mg | PEN_INJECTOR | SUBCUTANEOUS | Status: DC
Start: 2015-02-15 — End: 2016-05-08

## 2015-02-15 NOTE — Assessment & Plan Note (Signed)
Diabetes currently uncontrolled.  Pt needs A1C < 8 for hip/knee surgery. Patient denies hypoglycemic events and is able to verbalize appropriate hypoglycemia management plan.  Patient reports adherence with medication. Control is suboptimal due to diet and adherence. Continued basal insulin Lantus (insulin glargine) to 50 units daily. Continued rapid insulin Novolog (insulin aspart) to 20 units BIDAC.  Continued metformin 1000 mg BID.  Reordered Accuchek Aviva Plus test strips and encouraged pt to check BG at least once daily (preferably twice daily). Started Tanzeum (albiglutide) subcutaneous injections 30 mg weekly. Provided counseling on possible SE and provided instruction on injection technique. Next A1C anticipated 04/2015.

## 2015-02-15 NOTE — Assessment & Plan Note (Signed)
>>  ASSESSMENT AND PLAN FOR UNCONTROLLED TYPE 2 DIABETES MELLITUS WITH HYPERGLYCEMIA (HCC) WRITTEN ON 02/15/2015  5:02 PM BY KOVAL, PETER G, RPH  Diabetes currently uncontrolled.  Pt needs A1C < 8 for hip/knee surgery. Patient denies hypoglycemic events and is able to verbalize appropriate hypoglycemia management plan.  Patient reports adherence with medication. Control is suboptimal due to diet and adherence. Continued basal insulin Lantus (insulin glargine) to 50 units daily. Continued rapid insulin Novolog (insulin aspart) to 20 units BIDAC.  Continued metformin 1000 mg BID.  Reordered Accuchek Aviva Plus test strips and encouraged pt to check BG at least once daily (preferably twice daily). Started Tanzeum (albiglutide) subcutaneous injections 30 mg weekly. Provided counseling on possible SE and provided instruction on injection technique. Next A1C anticipated 04/2015.

## 2015-02-15 NOTE — Patient Instructions (Addendum)
Start Tanzeum (albiglutide) subcutaneous injections 30 mg weekly.   If you need further instruction on how to inject Tanzeum once you get home please ask your [facility] pharmacist or watch the video on [email or web address]  Please continue Lantus 50 units daily, Novolog 20 units twice daily before meals, and metformin 1000 mg twice daily.  Please check Blood Glucose at least once daily (preferably twice daily).  Please call clinic if you experience frequent BG < 80.    Thank you for coming in today.   Please return to see Dr Valentina Lucks in 2 weeks.

## 2015-02-15 NOTE — Progress Notes (Signed)
S:    Patient arrives in good spirits with assistance of a wheelchair and accompanied by her grandson.  Presents for diabetes evaluation and management.   Patient reports adherence with medications. Current diabetes medications include Lantus, Novolog, and Metformin.  Reports missing Novolog around twice a week due to forgetting medication or not eating. Reports missing lantus once a month. Pt reports sometimes forgetting 2nd dose of metformin.   Patient denies hypoglycemic events.  Patient reported dietary habits: per patient diet has improved since the holidays. Eats mostly 2 meals/day Largest meal supper around 7 pm: Corn, fried chicken, [id number]. Drinks water or sweet tea w/ splenda Brunch: Eggs and Wheat Toast, bacon. Coffee with cream and splenda. Snacks: Chips occasionally (small bag q2 days or so)  Patient reported exercise habits: Can walk in the house but uses wheelchair on long distances   Patient reports nocturia (1x/night, has decreased from 3-4x/night) Patient denies neuropathy. Patient denies visual changes. Patient reports self foot exams every other day.  Reports no changes.   Does not check SMBG often.  Has only ~8 readings over the past month.  Awaiting hip/knee surgery but needs A1C < 8.   O:  Lab Results  Component Value Date   HGBA1C 9.8 02/01/2015    Home fasting CBG: AM before Breakfast [phone] 2 hour post-prandial/random CBG: 275 (1145 pm) .  A/P: Diabetes currently uncontrolled.  Pt needs A1C < 8 for hip/knee surgery. Patient denies hypoglycemic events and is able to verbalize appropriate hypoglycemia management plan.  Patient reports adherence with medication. Control is suboptimal due to diet and adherence. Continued basal insulin Lantus (insulin glargine) to 50 units daily. Continued rapid insulin Novolog (insulin aspart) to 20 units BIDAC.  Continued metformin 1000 mg BID.  Reordered Accuchek Aviva Plus test strips and encouraged pt to check  BG at least once daily (preferably twice daily). Started Tanzeum (albiglutide) subcutaneous injections 30 mg weekly. Provided counseling on possible SE and provided instruction on injection technique. Next A1C anticipated 04/2015.    Written patient instructions provided.  Total time in face to face counseling 40 minutes.   Follow up in Pharmacist Clinic Visit in 2 weeks.   Patient seen with Phillis Knack, PharmD Candidate, and Bennye Alm, PharmD Resident.

## 2015-02-16 NOTE — Progress Notes (Signed)
Patient ID: Holly Acosta, female   DOB: 01/25/1955, 59 y.o.   MRN: [id number] Reviewed: Agree with Dr. Koval's documentation and management.  

## 2015-03-08 ENCOUNTER — Ambulatory Visit: Payer: Medicaid Other | Admitting: Pharmacist

## 2015-03-22 ENCOUNTER — Ambulatory Visit: Payer: Medicaid Other | Admitting: Pharmacist

## 2015-04-05 ENCOUNTER — Ambulatory Visit (INDEPENDENT_AMBULATORY_CARE_PROVIDER_SITE_OTHER): Payer: Medicaid Other | Admitting: Pharmacist

## 2015-04-05 ENCOUNTER — Encounter: Payer: Self-pay | Admitting: Pharmacist

## 2015-04-05 VITALS — BP 110/68 | HR 79 | Wt 185.0 lb

## 2015-04-05 DIAGNOSIS — E7800 Pure hypercholesterolemia, unspecified: Secondary | ICD-10-CM | POA: Diagnosis not present

## 2015-04-05 DIAGNOSIS — E1165 Type 2 diabetes mellitus with hyperglycemia: Secondary | ICD-10-CM | POA: Diagnosis not present

## 2015-04-05 DIAGNOSIS — IMO0002 Reserved for concepts with insufficient information to code with codable children: Secondary | ICD-10-CM

## 2015-04-05 DIAGNOSIS — E1169 Type 2 diabetes mellitus with other specified complication: Secondary | ICD-10-CM

## 2015-04-05 DIAGNOSIS — I10 Essential (primary) hypertension: Secondary | ICD-10-CM | POA: Diagnosis not present

## 2015-04-05 NOTE — Assessment & Plan Note (Signed)
Hypertension longstanding currently well controlled with use of multidrug regimen. Appears that spironolactone has improved control significantly.  Repeat BMET at next visit.  Patient reports adherence with medication. Control is at goal.

## 2015-04-05 NOTE — Assessment & Plan Note (Signed)
>>  ASSESSMENT AND PLAN FOR UNCONTROLLED TYPE 2 DIABETES MELLITUS WITH HYPERGLYCEMIA (HCC) WRITTEN ON 04/05/2015  6:15 PM BY KOVAL, PETER G, RPH  Diabetes longstanding currently uncontrolled. Patient denies hypoglycemic events and is able to verbalize appropriate hypoglycemia management plan. Patient reports adherence with medication. Although she has only taken albiglutide (Tanzem) once since last due to flu-like symptoms. Control is suboptimal due to inadequate medication therapy.  Restart Tanzem 1 injection weekly.  Continue Lantus 50 uinits daily and Novolog 20 units twice daily Next A1C anticipated 1-2 months.  Reeducated on need to work hard on diet with portion size control and attempting to remain active.  Follow up in 2-4 weeks depending on response to Tanzeum.

## 2015-04-05 NOTE — Assessment & Plan Note (Signed)
>>  ASSESSMENT AND PLAN FOR HYPERTENSION, BENIGN ESSENTIAL WRITTEN ON 04/05/2015  6:15 PM BY KOVAL, PETER G, RPH  Hypertension longstanding currently well controlled with use of multidrug regimen. Appears that spironolactone has improved control significantly.  Repeat BMET at next visit.  Patient reports adherence with medication. Control is at goal.

## 2015-04-05 NOTE — Patient Instructions (Addendum)
Thank you for coming to your visit today!  Plan:  Restart Tanzem 1 injection weekly Continue Lantus 50 uinits daily and Novolog 20 units twice daily  Follow-up with Pharmacy Clinic in 3-4 weeks.  Please bring your glucose meter with you to this appointment.

## 2015-04-05 NOTE — Assessment & Plan Note (Signed)
Diabetes longstanding currently uncontrolled. Patient denies hypoglycemic events and is able to verbalize appropriate hypoglycemia management plan. Patient reports adherence with medication. Although she has only taken albiglutide (Tanzem) once since last due to flu-like symptoms. Control is suboptimal due to inadequate medication therapy.  Restart Tanzem 1 injection weekly.  Continue Lantus 50 uinits daily and Novolog 20 units twice daily Next A1C anticipated 1-2 months.  Reeducated on need to work hard on diet with portion size control and attempting to remain active.  Follow up in 2-4 weeks depending on response to Tanzeum.

## 2015-04-05 NOTE — Progress Notes (Signed)
S:    Patient arrives in wheelchair with her granddaughter accompanying her at this visit. Presents for diabetes evaluation, education, and management at the request of Dr. Berkley Harvey.  Patient reports Diabetes was diagnosed about 6 years ago.   Patient reports adherence with medications. Although she has only taken albiglutide (Tanzem) once since last due to flu-like symptoms believed to NOT be due to the drug.   Current diabetes medications include: Lantus, Novolog, albiglutide (Tanzem) Current hypertension medications include: amlodipine, carvedilol, spironolactone  Patient denies hypoglycemic events.  Patient reported dietary habits: Eats 2 meals/day  Patient reported exercise habits: no exercise reported but patient states she is able to do without wheelchair at home    O:  Lab Results  Component Value Date   HGBA1C 9.8 02/01/2015   There were no vitals filed for this visit.  Home CBG readings: 150-200   A/P: Diabetes longstanding currently uncontrolled. Patient denies hypoglycemic events and is able to verbalize appropriate hypoglycemia management plan. Patient reports adherence with medication. Although she has only taken albiglutide (Tanzem) once since last due to flu-like symptoms. Control is suboptimal due to inadequate medication therapy.  Restart Tanzem 1 injection weekly.  Continue Lantus 50 uinits daily and Novolog 20 units twice daily Next A1C anticipated 1-2 months.  Reeducated on need to work hard on diet with portion size control and attempting to remain active.  Follow up in 2-4 weeks depending on response to Tanzeum.    ASCVD risk greater than 7.5%. Continued Aspirin 81 mg and Continued rosuvastatin 20 mg.   Hypertension longstanding currently well controlled with use of multidrug regimen. Appears that spironolactone has improved control significantly.  Repeat BMET at next visit.  Patient reports adherence with medication. Control is at goal.   Written patient  instructions provided.  Total time in face to face counseling 35 minutes.   Follow up in Pharmacist Clinic Visit 2-4 weeks.   Patient seen with Rudolpho Sevin, PharmD Candidate, and Nuala Alpha, PharmD Resident.

## 2015-04-05 NOTE — Assessment & Plan Note (Signed)
ASCVD risk greater than 7.5%. Continued Aspirin 81 mg and Continued rosuvastatin 20 mg.  Repeat Lipid panel next visit.

## 2015-04-06 NOTE — Progress Notes (Signed)
Patient ID: Holly Acosta, female   DOB: 03/19/1955, 59 y.o.   MRN: [id number] Reviewed: Agree with Dr. Koval's documentation and management.  

## 2015-04-10 ENCOUNTER — Other Ambulatory Visit: Payer: Self-pay | Admitting: Family Medicine

## 2015-04-14 ENCOUNTER — Other Ambulatory Visit: Payer: Self-pay | Admitting: Family Medicine

## 2015-04-16 ENCOUNTER — Telehealth: Payer: Self-pay | Admitting: *Deleted

## 2015-04-16 NOTE — Telephone Encounter (Signed)
Prior Authorization received from [facility] for Crestor 20 mg. Formulary and PA form placed in provider box for completion. Carlin Mamone L, RN  

## 2015-04-17 NOTE — Telephone Encounter (Signed)
Received PA approval for Rosuvastatin 20 mg via [employer] [employer].  Med approved for 04/17/15 - 04/16/16.  [facility] informed.  PA approval number [id number]. Approved for 30 day supply.  Derl Barrow, RN

## 2015-04-19 ENCOUNTER — Other Ambulatory Visit: Payer: Self-pay | Admitting: Family Medicine

## 2015-05-05 ENCOUNTER — Other Ambulatory Visit: Payer: Self-pay | Admitting: Family Medicine

## 2015-05-13 ENCOUNTER — Other Ambulatory Visit: Payer: Self-pay | Admitting: Family Medicine

## 2015-08-27 ENCOUNTER — Other Ambulatory Visit: Payer: Self-pay | Admitting: Family Medicine

## 2015-11-29 ENCOUNTER — Other Ambulatory Visit: Payer: Self-pay | Admitting: Family Medicine

## 2015-12-04 ENCOUNTER — Other Ambulatory Visit: Payer: Self-pay | Admitting: *Deleted

## 2015-12-04 ENCOUNTER — Other Ambulatory Visit: Payer: Self-pay | Admitting: Family Medicine

## 2015-12-04 MED ORDER — INSULIN GLARGINE 100 UNIT/ML SOLOSTAR PEN
PEN_INJECTOR | SUBCUTANEOUS | 1 refills | Status: DC
Start: 2015-12-04 — End: 2015-12-31

## 2015-12-06 ENCOUNTER — Other Ambulatory Visit: Payer: Self-pay | Admitting: Family Medicine

## 2015-12-31 ENCOUNTER — Other Ambulatory Visit: Payer: Self-pay | Admitting: *Deleted

## 2015-12-31 MED ORDER — INSULIN GLARGINE 100 UNIT/ML SOLOSTAR PEN: PEN_INJECTOR | SUBCUTANEOUS | 1 refills | Status: DC

## 2016-01-17 ENCOUNTER — Other Ambulatory Visit: Payer: Self-pay | Admitting: Family Medicine

## 2016-01-17 DIAGNOSIS — E1165 Type 2 diabetes mellitus with hyperglycemia: Principal | ICD-10-CM

## 2016-01-17 DIAGNOSIS — IMO0001 Reserved for inherently not codable concepts without codable children: Secondary | ICD-10-CM

## 2016-01-17 DIAGNOSIS — Z794 Long term (current) use of insulin: Principal | ICD-10-CM

## 2016-01-24 ENCOUNTER — Other Ambulatory Visit: Payer: Self-pay | Admitting: Family Medicine

## 2016-02-25 ENCOUNTER — Telehealth: Payer: Self-pay | Admitting: Internal Medicine

## 2016-02-25 NOTE — Telephone Encounter (Signed)
Pt informed. Fleeger, Jessica Dawn, CMA  

## 2016-02-25 NOTE — Telephone Encounter (Signed)
Form completed and given to Jessica. 

## 2016-02-25 NOTE — Telephone Encounter (Signed)
Clinical info completed on handicap form.  Place form in Dr. Mayo's box for completion.  HARTSELL,  JAZMIN, CMA   

## 2016-02-25 NOTE — Telephone Encounter (Signed)
Disability parking placard form dropped off for at front desk for completion.  Verified that patient section of form has been completed.  Last DOS  was 04/05/15.  Placed form in blue team folder to be completed by clinical staff.  Holly Acosta

## 2016-03-28 ENCOUNTER — Other Ambulatory Visit: Payer: Self-pay | Admitting: Internal Medicine

## 2016-03-28 MED ORDER — ROSUVASTATIN CALCIUM 20 MG PO TABS: 20.0000 mg | ORAL_TABLET | Freq: Every day | ORAL | 1 refills | Status: DC

## 2016-04-03 ENCOUNTER — Other Ambulatory Visit: Payer: Self-pay | Admitting: Family Medicine

## 2016-04-25 ENCOUNTER — Other Ambulatory Visit: Payer: Self-pay | Admitting: Internal Medicine

## 2016-05-08 ENCOUNTER — Ambulatory Visit (INDEPENDENT_AMBULATORY_CARE_PROVIDER_SITE_OTHER): Payer: Medicaid Other | Admitting: Internal Medicine

## 2016-05-08 ENCOUNTER — Encounter: Payer: Self-pay | Admitting: Internal Medicine

## 2016-05-08 VITALS — BP 110/70 | HR 87 | Ht 65.0 in | Wt 193.0 lb

## 2016-05-08 DIAGNOSIS — E1165 Type 2 diabetes mellitus with hyperglycemia: Secondary | ICD-10-CM | POA: Diagnosis not present

## 2016-05-08 DIAGNOSIS — Z794 Long term (current) use of insulin: Secondary | ICD-10-CM | POA: Diagnosis not present

## 2016-05-08 DIAGNOSIS — Z23 Encounter for immunization: Secondary | ICD-10-CM

## 2016-05-08 DIAGNOSIS — IMO0001 Reserved for inherently not codable concepts without codable children: Secondary | ICD-10-CM

## 2016-05-08 LAB — POCT GLYCOSYLATED HEMOGLOBIN (HGB A1C): Hemoglobin A1C: 13.4

## 2016-05-08 MED ORDER — INSULIN ASPART 100 UNIT/ML FLEXPEN: 20.0000 [IU] | PEN_INJECTOR | Freq: Three times a day (TID) | SUBCUTANEOUS | 2 refills | Status: DC

## 2016-05-08 MED ORDER — GLUCOSE BLOOD VI STRP: ORAL_STRIP | 12 refills | Status: DC

## 2016-05-08 NOTE — Assessment & Plan Note (Signed)
>>  ASSESSMENT AND PLAN FOR UNCONTROLLED TYPE 2 DIABETES MELLITUS WITH HYPERGLYCEMIA (HCC) WRITTEN ON 05/08/2016  5:02 PM BY MAYO, KATY DODD, MD  Uncontrolled. No lows. - A1c was 13.4 in clinic today, up from 9.8% in 01/2015 - Continue Lantus 50 units qAM and Metformin 1000 units bid - Increase Novolog from 20 units bid with meals to 20 units tid with meals - Start checking blood sugars twice a day and writing them down - Patient will call me in 2 weeks to let me know what her blood sugars have been running - Will likely need to increase Lantus in the future - Consider referral to Dr. Raymondo Band vs starting another agent like Victoza. - Diabetic foot exam performed in clinic today and was normal - Referral placed to ophthalmology for annual eye exam - Follow-up appointment in 3 months

## 2016-05-08 NOTE — Patient Instructions (Addendum)
It was so wonderful to meet you!   We need to work together over the next few months to try to get your blood sugars under better control.   Continue to take the Lantus 50 units in the morning. Please start taking Novolog 20 units three times a day with meals.   Please start checking your blood sugars twice a day and writing them down.  I have also referred you to the eye doctor for your annual exam. You should hear from our office in 2 weeks to schedule this appointment.  We will see you back in 3 months  -Dr. Brett Albino

## 2016-05-08 NOTE — Progress Notes (Signed)
   [facility] Phone: [phone]  Subjective:  Holly Acosta is a 61 year old female presenting to clinic for follow-up of her diabetes. She has not been seen in her clinic for her diabetes since 01/2015. She states she stopped checking her blood sugars many months ago because she ran out of refills and never called to get new ones. She is taking Lantus 50 units in the morning, Novolog 20 units twice a day with meals, and Metformin 1000mg  bid. No shakiness or sweatiness. No polyuria or polydipsia.  ROS: See HPI for pertinent positives and negatives  Past Medical History- T2DM, OSA, HTN, HLD  Family history reviewed for today's visit. No changes.  Social history- patient is a former smoker. Quit in 2008.  Objective: BP 110/70   Pulse 87   Ht 5\' 5"  (1.651 m)   Wt 193 lb (87.5 kg)   SpO2 91%   BMI 32.12 kg/m  Gen: NAD, alert, cooperative with exam HEENT: NCAT, EOMI, MMM Neck: FROM, supple CV: RRR, no murmur Resp: CTABL, no wheezes, normal work of breathing Msk: No edema, warm, normal tone, moves UE/LE spontaneously Diabetic Foot: No deformities, no ulcerations, no other skin breakdown bilaterally; intact to touch and monofilament testing bilaterally; PT and DP pulses intact bilaterally.  Assessment/Plan: T2DM: Uncontrolled. No lows. Has been on Albiglutide in the past, but stopped taking this. - A1c was 13.4 in clinic today, up from 9.8% in 01/2015 - Continue Lantus 50 units qAM, Metformin 1000 units bid - Increase Novolog from 20 units bid with meals to 20 units tid with meals - Start checking blood sugars twice a day and writing them down - Patient will call me in 2 weeks to let me know what her blood sugars have been running - Will likely need to increase Lantus in the future - Consider referral to Dr. Valentina Lucks - Diabetic foot exam performed in clinic today and was normal - Referral placed to ophthalmology for annual eye exam - Follow-up appointment in 3  months   Hyman Bible, MD PGY-2

## 2016-05-08 NOTE — Assessment & Plan Note (Signed)
Uncontrolled. No lows. - A1c was 13.4 in clinic today, up from 9.8% in 01/2015 - Continue Lantus 50 units qAM and Metformin 1000 units bid - Increase Novolog from 20 units bid with meals to 20 units tid with meals - Start checking blood sugars twice a day and writing them down - Patient will call me in 2 weeks to let me know what her blood sugars have been running - Will likely need to increase Lantus in the future - Consider referral to Dr. Valentina Lucks vs starting another agent like Victoza. - Diabetic foot exam performed in clinic today and was normal - Referral placed to ophthalmology for annual eye exam - Follow-up appointment in 3 months

## 2016-05-10 ENCOUNTER — Other Ambulatory Visit: Payer: Self-pay | Admitting: Family Medicine

## 2016-05-27 NOTE — Addendum Note (Signed)
Addended by: Valerie Roys on: 05/27/2016 11:01 AM   Modules accepted: Orders

## 2016-06-29 ENCOUNTER — Other Ambulatory Visit: Payer: Self-pay | Admitting: Family Medicine

## 2016-06-30 ENCOUNTER — Other Ambulatory Visit: Payer: Self-pay | Admitting: Internal Medicine

## 2016-06-30 ENCOUNTER — Telehealth: Payer: Self-pay | Admitting: *Deleted

## 2016-06-30 MED ORDER — ACCU-CHEK AVIVA CONNECT W/DEVICE KIT: 1.0000 | PACK | 0 refills | Status: DC | PRN

## 2016-06-30 MED ORDER — GLUCOSE BLOOD VI STRP: ORAL_STRIP | 12 refills | Status: DC

## 2016-06-30 NOTE — Telephone Encounter (Signed)
Prescriptions sent for accu-chek smartview strips and a new meter.

## 2016-06-30 NOTE — Telephone Encounter (Signed)
Message from Pharmacy:  Selinda Michaels test strips are on backorder until at least July. Please send a new rx for accu-check smartview or Guide test strips and a new meter, Thanks.

## 2016-07-01 MED ORDER — ACCU-CHEK NANO SMARTVIEW W/DEVICE KIT: PACK | 0 refills | Status: DC

## 2016-07-01 NOTE — Telephone Encounter (Signed)
Received fax from [facility] stating the [place] meter is on back order.  Request to change to Accu-Chek Nano.  Meter changed per request. Derl Barrow, RN

## 2016-07-10 ENCOUNTER — Other Ambulatory Visit: Payer: Self-pay | Admitting: Family Medicine

## 2016-07-22 ENCOUNTER — Other Ambulatory Visit: Payer: Self-pay | Admitting: Internal Medicine

## 2016-07-22 DIAGNOSIS — E1165 Type 2 diabetes mellitus with hyperglycemia: Secondary | ICD-10-CM

## 2016-07-22 DIAGNOSIS — IMO0002 Reserved for concepts with insufficient information to code with codable children: Secondary | ICD-10-CM

## 2016-07-22 DIAGNOSIS — I10 Essential (primary) hypertension: Secondary | ICD-10-CM

## 2016-07-22 DIAGNOSIS — E1169 Type 2 diabetes mellitus with other specified complication: Principal | ICD-10-CM

## 2016-07-22 DIAGNOSIS — Z794 Long term (current) use of insulin: Principal | ICD-10-CM

## 2016-07-22 NOTE — Telephone Encounter (Signed)
Attempted to call patient to discuss about her refill on Spironolactone. Patient was not available. Someone else picked up the phone. She is on Spironolactone and Lisinopril 40 mg. No history of CHF. Not on Lasix. The two medicines together increase her risk of hyperkalemia and arrhythmia. She hasn't had BMP in a year. Her BP has been well controlled. I don't think she needs Spironolactone for blood pressure control. I recommend stopping it and following up in clinic for hypertension.  Please advise her.  Thanks! Chayson Charters (covering for [employer])

## 2016-07-24 ENCOUNTER — Other Ambulatory Visit: Payer: Self-pay | Admitting: Family Medicine

## 2016-07-24 NOTE — Telephone Encounter (Signed)
LM for patient to call back.  Please assist her in scheduling an appointment to discuss her bp medication and its management. Jailani Hogans,CMA

## 2016-07-25 NOTE — Telephone Encounter (Signed)
Spoke with patient and gave her message from MD.  Patient voiced understanding and made an appt with PCP on 07-31-16. Makinze Jani,CMA

## 2016-07-31 ENCOUNTER — Ambulatory Visit (INDEPENDENT_AMBULATORY_CARE_PROVIDER_SITE_OTHER): Payer: Medicaid Other | Admitting: Internal Medicine

## 2016-07-31 ENCOUNTER — Encounter: Payer: Self-pay | Admitting: Internal Medicine

## 2016-07-31 VITALS — BP 124/86 | HR 88 | Temp 98.0°F | Ht 65.0 in | Wt 192.0 lb

## 2016-07-31 DIAGNOSIS — Z1159 Encounter for screening for other viral diseases: Secondary | ICD-10-CM

## 2016-07-31 DIAGNOSIS — Z794 Long term (current) use of insulin: Secondary | ICD-10-CM | POA: Diagnosis not present

## 2016-07-31 DIAGNOSIS — I10 Essential (primary) hypertension: Secondary | ICD-10-CM

## 2016-07-31 DIAGNOSIS — E1165 Type 2 diabetes mellitus with hyperglycemia: Secondary | ICD-10-CM

## 2016-07-31 LAB — POCT GLYCOSYLATED HEMOGLOBIN (HGB A1C): HEMOGLOBIN A1C: 13.7

## 2016-07-31 MED ORDER — ALBIGLUTIDE 30 MG ~~LOC~~ PEN: PEN_INJECTOR | SUBCUTANEOUS | 1 refills | Status: DC

## 2016-07-31 MED ORDER — LISINOPRIL 40 MG PO TABS: 40.0000 mg | ORAL_TABLET | Freq: Every day | ORAL | 1 refills | Status: DC

## 2016-07-31 NOTE — Progress Notes (Signed)
   [facility] Phone: [phone]  Subjective:  Holly Acosta is a 61 year old female presenting to clinic for follow-up of her diabetes and HTN.  HTN: Stopped taking Lisinopril last week because she ran out of it. Taking Spironolactone and Coreg. No side effects. Not checking blood pressures at home. No chest pain, no SOB, no lower extremity edema.  T2DM: Checking blood sugars 1-2 times per day. Ranging 168-200, mostly in the 180s. No lows, no shakiness, no sweatiness. Taking Metformin 1000mg  bid, Lantus 50 units daily, and Novolog 20 units tid. No side effects. Was previously on Albiglutide SQ weekly, but states she just stopped taking this because she didn't know if she needed it.  ROS: See HPI for pertinent positives and negatives  Past Medical History- HTN, OSA, T2DM, HLD, depression, obesity  Family history reviewed for today's visit. No changes.  Social history- patient is a former smoker, quit in 2008.  Objective: BP 124/86   Pulse 88   Temp 98 F (36.7 C) (Oral)   Ht 5\' 5"  (1.651 m)   Wt 192 lb (87.1 kg)   SpO2 98%   BMI 31.95 kg/m  Gen: NAD, alert, cooperative with exam HEENT: NCAT, EOMI, MMM Neck: FROM, supple CV: RRR, no murmur Resp: CTABL, no wheezes, normal work of breathing Msk: No edema  Assessment/Plan: HTN: Well-controlled. BP 124/86 today. Has not been taking Lisinopril for the last week because she ran out. Taking Spironolactone and Coreg. - Will stop Spironolactone because patient does not seem to need it and she does not come in frequently enough for [place] to be able to safely monitor her electrolytes. - Restart Lisinopril for renal protection in the setting of T2DM - Continue Coreg - Check BMP to monitor electrolytes and renal function - Follow-up in 3 months for BP check  T2DM: Poorly controlled. Last A1c 13.4% in 04/2016. Repeat A1c 13.7% today. - Discussed different options with patient, including increasing her insulin or adding on  a new medication. Patient would like to be placed back on the weekly injectable diabetes medication that she was previously on. - Prescribed Albiglutide 30mg  qweekly - Check BMP to monitor kidney function while on Metformin - Follow-up for repeat A1c in 3 months - Could consider increasing Albiglutide dose to 50mg  qweekly or increasing insulin dosing in the future - May need to consider referral to Dr. Sande Brothers, MD PGY-3

## 2016-07-31 NOTE — Patient Instructions (Signed)
It was so nice to see you!  For your blood pressure, please stop taking your Spironolactone and restart the Lisinopril.  For your diabetes, please keep taking the Lantus and Novolog like you currently are. I have restarted your Albiglutide. You should inject this once a week.  We will see you back in 3 months!  -Dr. Brett Albino

## 2016-07-31 NOTE — Assessment & Plan Note (Signed)
Well-controlled. BP 124/86 today. Has not been taking Lisinopril for the last week because she ran out. Taking Spironolactone and Coreg. - Will stop Spironolactone because patient does not seem to need it and she does not come in frequently enough for [place] to be able to safely monitor her electrolytes. - Restart Lisinopril for renal protection in the setting of T2DM - Continue Coreg - Check BMP to monitor electrolytes and renal function - Follow-up in 3 months for BP check

## 2016-07-31 NOTE — Assessment & Plan Note (Signed)
Poorly controlled. Last A1c 13.4% in 04/2016. Repeat A1c 13.7% today. - Discussed different options with patient, including increasing her insulin or adding on a new medication. Patient would like to be placed back on the weekly injectable diabetes medication that she was previously on. - Prescribed Albiglutide 30mg  qweekly - Check BMP to monitor kidney function while on Metformin - Follow-up for repeat A1c in 3 months - Could consider increasing Albiglutide dose to 50mg  qweekly or increasing insulin dosing in the future - May need to consider referral to Dr. Valentina Lucks

## 2016-07-31 NOTE — Assessment & Plan Note (Signed)
>>  ASSESSMENT AND PLAN FOR HYPERTENSION, BENIGN ESSENTIAL WRITTEN ON 07/31/2016  6:50 PM BY MAYO, KATY DODD, MD  Well-controlled. BP 124/86 today. Has not been taking Lisinopril for the last week because she ran out. Taking Spironolactone and Coreg. - Will stop Spironolactone because patient does not seem to need it and she does not come in frequently enough for [place] to be able to safely monitor her electrolytes. - Restart Lisinopril for renal protection in the setting of T2DM - Continue Coreg - Check BMP to monitor electrolytes and renal function - Follow-up in 3 months for BP check

## 2016-07-31 NOTE — Assessment & Plan Note (Signed)
>>  ASSESSMENT AND PLAN FOR UNCONTROLLED TYPE 2 DIABETES MELLITUS WITH HYPERGLYCEMIA ([facility]) WRITTEN ON 07/31/2016  6:50 PM BY MAYO, KATY DODD, MD  Poorly controlled. Last A1c 13.4% in 04/2016. Repeat A1c 13.7% today. - Discussed different options with patient, including increasing her insulin or adding on a new medication. Patient would like to be placed back on the weekly injectable diabetes medication that she was previously on. - Prescribed Albiglutide 30mg  qweekly - Check BMP to monitor kidney function while on Metformin - Follow-up for repeat A1c in 3 months - Could consider increasing Albiglutide dose to 50mg  qweekly or increasing insulin dosing in the future - May need to consider referral to Dr. Raymondo Band

## 2016-08-01 ENCOUNTER — Telehealth: Payer: Self-pay | Admitting: Internal Medicine

## 2016-08-01 ENCOUNTER — Other Ambulatory Visit: Payer: Self-pay | Admitting: Internal Medicine

## 2016-08-01 DIAGNOSIS — I10 Essential (primary) hypertension: Secondary | ICD-10-CM

## 2016-08-01 LAB — BASIC METABOLIC PANEL
BUN / CREAT RATIO: 15 (ref 12–28)
BUN: 19 mg/dL (ref 8–27)
CALCIUM: 10.1 mg/dL (ref 8.7–10.3)
CHLORIDE: 97 mmol/L (ref 96–106)
CO2: 19 mmol/L — AB (ref 20–29)
CREATININE: 1.28 mg/dL — AB (ref 0.57–1.00)
GFR calc Af Amer: 53 mL/min/{1.73_m2} — ABNORMAL LOW (ref >59)
GFR calc non Af Amer: 46 mL/min/{1.73_m2} — ABNORMAL LOW (ref >59)
GLUCOSE: 196 mg/dL — AB (ref 65–99)
Potassium: 4.5 mmol/L (ref 3.5–5.2)
Sodium: 136 mmol/L (ref 134–144)

## 2016-08-01 LAB — HEPATITIS C ANTIBODY

## 2016-08-01 NOTE — Telephone Encounter (Signed)
Called patient to discuss her lab results. Her electrolytes looked fine. Her creatinine was 1.28 (baseline 1.0-1.5 since 2016). She is planning to pick up her Lisinopril prescription today, so we will recheck her BMET in 1 week. Future order placed. Patient voiced understanding.  Hyman Bible, MD PGY-3

## 2016-08-21 ENCOUNTER — Other Ambulatory Visit: Payer: Self-pay | Admitting: Internal Medicine

## 2016-08-28 ENCOUNTER — Other Ambulatory Visit: Payer: Self-pay | Admitting: Internal Medicine

## 2016-08-28 DIAGNOSIS — E1165 Type 2 diabetes mellitus with hyperglycemia: Principal | ICD-10-CM

## 2016-08-28 DIAGNOSIS — Z794 Long term (current) use of insulin: Principal | ICD-10-CM

## 2016-08-28 DIAGNOSIS — IMO0001 Reserved for inherently not codable concepts without codable children: Secondary | ICD-10-CM

## 2016-08-31 ENCOUNTER — Other Ambulatory Visit: Payer: Self-pay | Admitting: Internal Medicine

## 2016-08-31 ENCOUNTER — Other Ambulatory Visit: Payer: Self-pay | Admitting: Student

## 2016-08-31 DIAGNOSIS — E1169 Type 2 diabetes mellitus with other specified complication: Principal | ICD-10-CM

## 2016-08-31 DIAGNOSIS — IMO0002 Reserved for concepts with insufficient information to code with codable children: Secondary | ICD-10-CM

## 2016-08-31 DIAGNOSIS — E1165 Type 2 diabetes mellitus with hyperglycemia: Secondary | ICD-10-CM

## 2016-08-31 DIAGNOSIS — Z794 Long term (current) use of insulin: Principal | ICD-10-CM

## 2016-09-11 ENCOUNTER — Telehealth: Payer: Self-pay | Admitting: *Deleted

## 2016-09-11 NOTE — Telephone Encounter (Signed)
Received [facility] from [facility] stating Tanzeum 30 mg Pen has been discontinued by manufacturer. Please send in alternative. Medicaid formulary placed in provider box for review.  Derl Barrow, RN

## 2016-09-12 ENCOUNTER — Other Ambulatory Visit: Payer: Self-pay | Admitting: Internal Medicine

## 2016-09-12 MED ORDER — ALBIGLUTIDE 30 MG ~~LOC~~ PEN: 30.0000 mg | PEN_INJECTOR | SUBCUTANEOUS | 3 refills | Status: DC

## 2016-09-12 NOTE — Telephone Encounter (Signed)
New prescription sent. Thanks

## 2016-09-15 NOTE — Telephone Encounter (Signed)
Tanzeum 30 mg was discontinued by manufacturer. Please send in an alternative medication.  Derl Barrow, RN

## 2016-09-16 ENCOUNTER — Other Ambulatory Visit: Payer: Self-pay | Admitting: Internal Medicine

## 2016-09-16 MED ORDER — DULAGLUTIDE 0.75 MG/0.5ML ~~LOC~~ SOAJ: 0.7500 mg | SUBCUTANEOUS | 0 refills | Status: DC

## 2016-09-16 NOTE — Telephone Encounter (Signed)
Sent in prescription for Trulicity. Thanks!

## 2016-10-11 ENCOUNTER — Other Ambulatory Visit: Payer: Self-pay | Admitting: Internal Medicine

## 2016-12-03 ENCOUNTER — Other Ambulatory Visit: Payer: Self-pay | Admitting: Internal Medicine

## 2016-12-06 ENCOUNTER — Other Ambulatory Visit: Payer: Self-pay | Admitting: Internal Medicine

## 2017-01-14 ENCOUNTER — Other Ambulatory Visit: Payer: Self-pay | Admitting: Internal Medicine

## 2017-01-24 ENCOUNTER — Other Ambulatory Visit: Payer: Self-pay | Admitting: Family Medicine

## 2017-01-24 ENCOUNTER — Other Ambulatory Visit: Payer: Self-pay | Admitting: Internal Medicine

## 2017-01-24 DIAGNOSIS — IMO0001 Reserved for inherently not codable concepts without codable children: Secondary | ICD-10-CM

## 2017-01-24 DIAGNOSIS — Z794 Long term (current) use of insulin: Principal | ICD-10-CM

## 2017-01-24 DIAGNOSIS — E1165 Type 2 diabetes mellitus with hyperglycemia: Principal | ICD-10-CM

## 2017-02-23 ENCOUNTER — Other Ambulatory Visit: Payer: Self-pay | Admitting: Internal Medicine

## 2017-03-17 ENCOUNTER — Other Ambulatory Visit: Payer: Self-pay | Admitting: Internal Medicine

## 2017-04-23 ENCOUNTER — Other Ambulatory Visit: Payer: Self-pay | Admitting: Internal Medicine

## 2017-04-23 DIAGNOSIS — E669 Obesity, unspecified: Secondary | ICD-10-CM | POA: Diagnosis present

## 2017-04-23 DIAGNOSIS — Z888 Allergy status to other drugs, medicaments and biological substances status: Secondary | ICD-10-CM

## 2017-04-23 DIAGNOSIS — I10 Essential (primary) hypertension: Secondary | ICD-10-CM | POA: Diagnosis present

## 2017-04-23 DIAGNOSIS — Z87891 Personal history of nicotine dependence: Secondary | ICD-10-CM

## 2017-04-23 DIAGNOSIS — Z79899 Other long term (current) drug therapy: Secondary | ICD-10-CM

## 2017-04-23 DIAGNOSIS — Z8673 Personal history of transient ischemic attack (TIA), and cerebral infarction without residual deficits: Secondary | ICD-10-CM

## 2017-04-23 DIAGNOSIS — G4733 Obstructive sleep apnea (adult) (pediatric): Secondary | ICD-10-CM | POA: Diagnosis present

## 2017-04-23 DIAGNOSIS — I25110 Atherosclerotic heart disease of native coronary artery with unstable angina pectoris: Secondary | ICD-10-CM | POA: Diagnosis present

## 2017-04-23 DIAGNOSIS — Z6829 Body mass index (BMI) 29.0-29.9, adult: Secondary | ICD-10-CM

## 2017-04-23 DIAGNOSIS — M1710 Unilateral primary osteoarthritis, unspecified knee: Secondary | ICD-10-CM | POA: Diagnosis present

## 2017-04-23 DIAGNOSIS — E785 Hyperlipidemia, unspecified: Secondary | ICD-10-CM | POA: Diagnosis present

## 2017-04-23 DIAGNOSIS — Z833 Family history of diabetes mellitus: Secondary | ICD-10-CM

## 2017-04-23 DIAGNOSIS — Z955 Presence of coronary angioplasty implant and graft: Secondary | ICD-10-CM

## 2017-04-23 DIAGNOSIS — Z794 Long term (current) use of insulin: Secondary | ICD-10-CM

## 2017-04-23 DIAGNOSIS — E1165 Type 2 diabetes mellitus with hyperglycemia: Secondary | ICD-10-CM | POA: Diagnosis present

## 2017-04-23 DIAGNOSIS — I214 Non-ST elevation (NSTEMI) myocardial infarction: Principal | ICD-10-CM | POA: Diagnosis present

## 2017-04-23 DIAGNOSIS — Z8249 Family history of ischemic heart disease and other diseases of the circulatory system: Secondary | ICD-10-CM

## 2017-04-23 DIAGNOSIS — Z9119 Patient's noncompliance with other medical treatment and regimen: Secondary | ICD-10-CM

## 2017-04-23 DIAGNOSIS — E7800 Pure hypercholesterolemia, unspecified: Secondary | ICD-10-CM | POA: Diagnosis present

## 2017-04-24 ENCOUNTER — Emergency Department (HOSPITAL_COMMUNITY): Payer: Medicaid Other

## 2017-04-24 ENCOUNTER — Other Ambulatory Visit: Payer: Self-pay

## 2017-04-24 ENCOUNTER — Inpatient Hospital Stay (HOSPITAL_COMMUNITY)
Admission: EM | Admit: 2017-04-24 | Discharge: 2017-05-04 | DRG: 247 | Disposition: A | Discharge status: Skilled Nursing Facility | Payer: Medicaid Other | Attending: Cardiology | Admitting: Cardiology

## 2017-04-24 ENCOUNTER — Encounter (HOSPITAL_COMMUNITY): Payer: Self-pay | Admitting: Emergency Medicine

## 2017-04-24 DIAGNOSIS — I25110 Atherosclerotic heart disease of native coronary artery with unstable angina pectoris: Secondary | ICD-10-CM | POA: Clinically undetermined

## 2017-04-24 DIAGNOSIS — E1159 Type 2 diabetes mellitus with other circulatory complications: Secondary | ICD-10-CM | POA: Diagnosis present

## 2017-04-24 DIAGNOSIS — R072 Precordial pain: Secondary | ICD-10-CM | POA: Diagnosis not present

## 2017-04-24 DIAGNOSIS — I214 Non-ST elevation (NSTEMI) myocardial infarction: Secondary | ICD-10-CM | POA: Diagnosis not present

## 2017-04-24 DIAGNOSIS — E1169 Type 2 diabetes mellitus with other specified complication: Secondary | ICD-10-CM | POA: Diagnosis present

## 2017-04-24 DIAGNOSIS — E785 Hyperlipidemia, unspecified: Secondary | ICD-10-CM

## 2017-04-24 DIAGNOSIS — E119 Type 2 diabetes mellitus without complications: Secondary | ICD-10-CM | POA: Diagnosis present

## 2017-04-24 DIAGNOSIS — Z794 Long term (current) use of insulin: Secondary | ICD-10-CM | POA: Diagnosis present

## 2017-04-24 DIAGNOSIS — E1165 Type 2 diabetes mellitus with hyperglycemia: Secondary | ICD-10-CM | POA: Diagnosis present

## 2017-04-24 DIAGNOSIS — R778 Other specified abnormalities of plasma proteins: Secondary | ICD-10-CM

## 2017-04-24 DIAGNOSIS — Z955 Presence of coronary angioplasty implant and graft: Secondary | ICD-10-CM

## 2017-04-24 DIAGNOSIS — I639 Cerebral infarction, unspecified: Secondary | ICD-10-CM | POA: Diagnosis not present

## 2017-04-24 DIAGNOSIS — J189 Pneumonia, unspecified organism: Secondary | ICD-10-CM

## 2017-04-24 DIAGNOSIS — I152 Hypertension secondary to endocrine disorders: Secondary | ICD-10-CM | POA: Diagnosis present

## 2017-04-24 DIAGNOSIS — R079 Chest pain, unspecified: Secondary | ICD-10-CM | POA: Insufficient documentation

## 2017-04-24 DIAGNOSIS — I10 Essential (primary) hypertension: Secondary | ICD-10-CM | POA: Diagnosis present

## 2017-04-24 DIAGNOSIS — I63442 Cerebral infarction due to embolism of left cerebellar artery: Secondary | ICD-10-CM

## 2017-04-24 DIAGNOSIS — R7989 Other specified abnormal findings of blood chemistry: Secondary | ICD-10-CM

## 2017-04-24 HISTORY — DX: Personal history of other medical treatment: Z92.89

## 2017-04-24 HISTORY — DX: Cerebral infarction, unspecified: I63.9

## 2017-04-24 HISTORY — DX: Chest pain, unspecified: R07.9

## 2017-04-24 HISTORY — DX: Anemia, unspecified: D64.9

## 2017-04-24 HISTORY — DX: Non-ST elevation (NSTEMI) myocardial infarction: I21.4

## 2017-04-24 HISTORY — DX: Presence of coronary angioplasty implant and graft: Z95.5

## 2017-04-24 HISTORY — DX: Atherosclerotic heart disease of native coronary artery with unstable angina pectoris: I25.110

## 2017-04-24 LAB — I-STAT TROPONIN, ED
TROPONIN I, POC: 0 ng/mL (ref 0.00–0.08)
Troponin i, poc: 0.08 ng/mL (ref 0.00–0.08)

## 2017-04-24 LAB — BASIC METABOLIC PANEL
Anion gap: 12 (ref 5–15)
BUN: 21 mg/dL — AB (ref 6–20)
CALCIUM: 9.5 mg/dL (ref 8.9–10.3)
CO2: 23 mmol/L (ref 22–32)
Chloride: 98 mmol/L — ABNORMAL LOW (ref 101–111)
Creatinine, Ser: 0.98 mg/dL (ref 0.44–1.00)
GFR calc Af Amer: >60 mL/min (ref >60)
Glucose, Bld: 231 mg/dL — ABNORMAL HIGH (ref 65–99)
Potassium: 4 mmol/L (ref 3.5–5.1)
Sodium: 133 mmol/L — ABNORMAL LOW (ref 135–145)

## 2017-04-24 LAB — HEPARIN LEVEL (UNFRACTIONATED): Heparin Unfractionated: <0.1 IU/mL — ABNORMAL LOW (ref 0.30–0.70)

## 2017-04-24 LAB — D-DIMER, QUANTITATIVE (NOT AT ARMC)

## 2017-04-24 LAB — CBC
HEMATOCRIT: 42 % (ref 36.0–46.0)
HEMOGLOBIN: 13.7 g/dL (ref 12.0–15.0)
MCH: 28.6 pg (ref 26.0–34.0)
MCHC: 32.6 g/dL (ref 30.0–36.0)
MCV: 87.7 fL (ref 78.0–100.0)
Platelets: 245 10*3/uL (ref 150–400)
RBC: 4.79 MIL/uL (ref 3.87–5.11)
RDW: 13.9 % (ref 11.5–15.5)
WBC: 8.9 10*3/uL (ref 4.0–10.5)

## 2017-04-24 LAB — TROPONIN I
TROPONIN I: 1.83 ng/mL — AB (ref <0.03)
Troponin I: 0.83 ng/mL (ref <0.03)

## 2017-04-24 LAB — GLUCOSE, CAPILLARY
GLUCOSE-CAPILLARY: 331 mg/dL — AB (ref 65–99)
Glucose-Capillary: 301 mg/dL — ABNORMAL HIGH (ref 65–99)
Glucose-Capillary: 267 mg/dL — ABNORMAL HIGH (ref 65–99)

## 2017-04-24 LAB — BRAIN NATRIURETIC PEPTIDE: B Natriuretic Peptide: 31 pg/mL (ref 0.0–100.0)

## 2017-04-24 LAB — CBG MONITORING, ED: Glucose-Capillary: 237 mg/dL — ABNORMAL HIGH (ref 65–99)

## 2017-04-24 MED ORDER — HEPARIN BOLUS VIA INFUSION
4000.0000 [IU] | Freq: Once | INTRAVENOUS | Status: AC
  Administered 2017-04-24: 4000 [IU] via INTRAVENOUS
  Filled 2017-04-24: qty 4000

## 2017-04-24 MED ORDER — NITROGLYCERIN 0.4 MG SL SUBL
0.4000 mg | SUBLINGUAL_TABLET | SUBLINGUAL | Status: DC | PRN
  Administered 2017-04-24: 0.4 mg via SUBLINGUAL
  Filled 2017-04-24: qty 1

## 2017-04-24 MED ORDER — INSULIN GLARGINE 100 UNIT/ML ~~LOC~~ SOLN
25.0000 [IU] | Freq: Every day | SUBCUTANEOUS | Status: DC
  Administered 2017-04-24 – 2017-05-01 (×7): 25 [IU] via SUBCUTANEOUS
  Filled 2017-04-24 (×8): qty 0.25

## 2017-04-24 MED ORDER — AMLODIPINE BESYLATE 10 MG PO TABS
10.0000 mg | ORAL_TABLET | Freq: Every day | ORAL | Status: DC
  Administered 2017-04-24 – 2017-04-30 (×7): 10 mg via ORAL
  Filled 2017-04-24: qty 2
  Filled 2017-04-24 (×6): qty 1

## 2017-04-24 MED ORDER — HEPARIN (PORCINE) IN NACL 100-0.45 UNIT/ML-% IJ SOLN
1550.0000 [IU]/h | INTRAMUSCULAR | Status: DC
  Administered 2017-04-24: 900 [IU]/h via INTRAVENOUS
  Administered 2017-04-26 (×2): 1500 [IU]/h via INTRAVENOUS
  Filled 2017-04-24 (×4): qty 250

## 2017-04-24 MED ORDER — ACETAMINOPHEN 325 MG PO TABS
650.0000 mg | ORAL_TABLET | Freq: Once | ORAL | Status: DC
  Filled 2017-04-24: qty 2

## 2017-04-24 MED ORDER — ACETAMINOPHEN 325 MG PO TABS
650.0000 mg | ORAL_TABLET | ORAL | Status: DC | PRN
  Administered 2017-05-02: 650 mg via ORAL
  Filled 2017-04-24: qty 2

## 2017-04-24 MED ORDER — ONDANSETRON HCL 4 MG/2ML IJ SOLN: 4.0000 mg | Freq: Four times a day (QID) | INTRAMUSCULAR | Status: DC | PRN

## 2017-04-24 MED ORDER — INSULIN ASPART 100 UNIT/ML ~~LOC~~ SOLN
0.0000 [IU] | Freq: Three times a day (TID) | SUBCUTANEOUS | Status: DC
  Administered 2017-04-24 – 2017-04-25 (×2): 8 [IU] via SUBCUTANEOUS
  Administered 2017-04-25 (×2): 11 [IU] via SUBCUTANEOUS
  Administered 2017-04-26 – 2017-04-27 (×4): 8 [IU] via SUBCUTANEOUS
  Administered 2017-04-27 – 2017-04-28 (×2): 5 [IU] via SUBCUTANEOUS
  Administered 2017-04-28: 11 [IU] via SUBCUTANEOUS
  Administered 2017-04-28 – 2017-04-29 (×4): 8 [IU] via SUBCUTANEOUS
  Administered 2017-04-30 (×2): 3 [IU] via SUBCUTANEOUS
  Administered 2017-04-30: 5 [IU] via SUBCUTANEOUS
  Administered 2017-05-01: 8 [IU] via SUBCUTANEOUS
  Administered 2017-05-01: 11 [IU] via SUBCUTANEOUS
  Administered 2017-05-01 – 2017-05-02 (×2): 5 [IU] via SUBCUTANEOUS
  Administered 2017-05-02: 3 [IU] via SUBCUTANEOUS
  Administered 2017-05-02: 8 [IU] via SUBCUTANEOUS
  Administered 2017-05-03: 5 [IU] via SUBCUTANEOUS
  Administered 2017-05-03: 8 [IU] via SUBCUTANEOUS
  Administered 2017-05-03: 5 [IU] via SUBCUTANEOUS
  Administered 2017-05-04 (×2): 3 [IU] via SUBCUTANEOUS

## 2017-04-24 MED ORDER — SODIUM CHLORIDE 0.9% FLUSH: 3.0000 mL | INTRAVENOUS | Status: DC | PRN

## 2017-04-24 MED ORDER — ROSUVASTATIN CALCIUM 20 MG PO TABS
20.0000 mg | ORAL_TABLET | Freq: Every day | ORAL | Status: DC
  Administered 2017-04-24: 20 mg via ORAL
  Filled 2017-04-24: qty 1

## 2017-04-24 MED ORDER — CARVEDILOL 12.5 MG PO TABS
12.5000 mg | ORAL_TABLET | Freq: Two times a day (BID) | ORAL | Status: DC
  Administered 2017-04-25 – 2017-05-04 (×19): 12.5 mg via ORAL
  Filled 2017-04-24 (×20): qty 1

## 2017-04-24 MED ORDER — SODIUM CHLORIDE 0.9% FLUSH
3.0000 mL | Freq: Two times a day (BID) | INTRAVENOUS | Status: DC
  Administered 2017-04-24 – 2017-04-27 (×5): 3 mL via INTRAVENOUS

## 2017-04-24 MED ORDER — ENOXAPARIN SODIUM 40 MG/0.4ML ~~LOC~~ SOLN: 40.0000 mg | SUBCUTANEOUS | Status: DC

## 2017-04-24 MED ORDER — INSULIN ASPART 100 UNIT/ML ~~LOC~~ SOLN: 0.0000 [IU] | Freq: Three times a day (TID) | SUBCUTANEOUS | Status: DC

## 2017-04-24 MED ORDER — INSULIN ASPART 100 UNIT/ML ~~LOC~~ SOLN
0.0000 [IU] | Freq: Every day | SUBCUTANEOUS | Status: DC
  Administered 2017-04-24: 4 [IU] via SUBCUTANEOUS
  Administered 2017-04-25: 3 [IU] via SUBCUTANEOUS
  Administered 2017-04-26: 4 [IU] via SUBCUTANEOUS
  Administered 2017-04-27 – 2017-04-28 (×2): 3 [IU] via SUBCUTANEOUS
  Administered 2017-04-29: 2 [IU] via SUBCUTANEOUS
  Administered 2017-05-01: 3 [IU] via SUBCUTANEOUS
  Administered 2017-05-02: 2 [IU] via SUBCUTANEOUS

## 2017-04-24 MED ORDER — LISINOPRIL 20 MG PO TABS
40.0000 mg | ORAL_TABLET | Freq: Every day | ORAL | Status: DC
  Administered 2017-04-24 – 2017-04-30 (×7): 40 mg via ORAL
  Filled 2017-04-24: qty 2
  Filled 2017-04-24 (×2): qty 1
  Filled 2017-04-24 (×2): qty 2
  Filled 2017-04-24 (×2): qty 1

## 2017-04-24 MED ORDER — ASPIRIN EC 81 MG PO TBEC
81.0000 mg | DELAYED_RELEASE_TABLET | Freq: Every day | ORAL | Status: DC
  Administered 2017-04-24 – 2017-05-04 (×11): 81 mg via ORAL
  Filled 2017-04-24 (×11): qty 1

## 2017-04-24 MED ORDER — SODIUM CHLORIDE 0.9 % IV SOLN: 250.0000 mL | INTRAVENOUS | Status: DC | PRN

## 2017-04-24 NOTE — Progress Notes (Signed)
ANTICOAGULATION CONSULT NOTE - Initial Consult  Pharmacy Consult for heparin Indication: chest pain/ACS  Allergies  Allergen Reactions  . Lipitor [Atorvastatin] Other (See Comments)    Cramping in feet that resolved after stopping med  . Diclofenac Nausea Only and Other (See Comments)    Caused a lot of stomach pain     Patient Measurements: Height: 5\' 5"  (165.1 cm) Weight: 200 lb (90.7 kg) IBW/kg (Calculated) : 57 Heparin Dosing Weight: 77 kg   Vital Signs: BP: 162/89 (04/05 1500) Pulse Rate: 70 (04/05 1500)  Labs: Recent Labs    04/24/17 0023 04/24/17 1253  HGB 13.7  --   HCT 42.0  --   PLT 245  --   CREATININE 0.98  --   TROPONINI  --  0.83*    Estimated Creatinine Clearance: 67.1 mL/min (by C-G formula based on SCr of 0.98 mg/dL).   Medical History: Past Medical History:  Diagnosis Date  . Arthritis of knee   . Diabetes mellitus, type 2 ([place])   . HLD (hyperlipidemia)   . HTN (hypertension)   . Prolapsed uterus   . Sleep apnea     Medications:   (Not in a hospital admission)  Assessment: 41 YOF with chest pain and elevated troponin to start IV heparin for ACS. She is not on any anticoagulation prior to admission. H/H and Plt wnl.   Goal of Therapy:  Heparin level 0.3-0.7 units/ml Monitor platelets by anticoagulation protocol: Yes   Plan:  -Heparin 4000 units IV once, then heparin at 900 units/hr -F/u 6 hr HL -Monitor daily HL, CBC and s/s of bleeding  Albertina Parr, PharmD., BCPS Clinical Pharmacist Clinical phone for 04/24/17: [phone]

## 2017-04-24 NOTE — ED Notes (Signed)
Pt daughter continues to state her mother is in "excruciating pain" This RN offered Tylenol

## 2017-04-24 NOTE — ED Notes (Signed)
Admitting Paged about increasing Trop

## 2017-04-24 NOTE — ED Notes (Signed)
Pt ambulatory with steady gait. As RN entered assignment Pt was walking to bathroom. RN offered bedside commode. Pt insists on walking to bathroom.

## 2017-04-24 NOTE — ED Notes (Addendum)
Pt had her family member take her outside and then told a bystander to come and get her because her chest is feeling worse. Staff went to the pt and the daughter got upset and said that she has had a heart attach before and knows that her mother is having one as well. Staff assured the patient that a EKG has been done and her blood work is in process offered the patient be re-evaluated.

## 2017-04-24 NOTE — ED Triage Notes (Signed)
Pt reports central CP X1 day with radiation down both arms. Intermittent, tight in nature, 3/10. Denies SOB, N/V, dizziness, etc.

## 2017-04-24 NOTE — ED Notes (Signed)
Ordered diet tray for pt  

## 2017-04-24 NOTE — ED Notes (Signed)
Pt stated her chest was hurting worse.  Made RN Tori aware.  Rechecked vitals

## 2017-04-24 NOTE — H&P (Addendum)
Cardiology Admission History and Physical:   Patient ID: TALESHIA LUFF; MRN: [id number]; DOB: 1955-10-11   Admission date: 04/24/2017  Primary Care Provider: Sela Hua, MD Primary Cardiologist: Remotely Dr. Martinique Primary Electrophysiologist:  None  Chief Complaint:  Chest pain  Patient Profile:   Holly Acosta is a 62 y.o. female with a PMH HTN, HLD, DM type 2, OSA, and arthritis, who presents with CP.   History of Present Illness:   Ms. Sabatino was in her usual state of health until yesterday evening when she developed an achy substernal chest pain radiating to both arms while watching TV. She reports the initial episode lasted 5-10 minutes and resolved spontaneously. She continued to have waxing and waning chest pain prompting her to present to the ED. She attempted deep breathing exercises and getting fresh air which alleviated her symptoms some. She reports a history of panic attacks but states these episodes were not as severe. She noted one episode that occurred while at the hospital was associated with diaphoresis and nausea. She had some lightheadedness but denied SOB, vomiting, dizziness, or syncope.   She has not seen a cardiologist in years. She had a NST in 2008 with reversible decreased myocardial perfusion and further underwent cardiac catheterization with normal coronaries. She has not had an ischemic evaluation since that time. She reports being fairly sedentary with mobility limited by arthritis. Denies family history of CAD, MI, CHF. She is a former smoker, quit 20+ years ago, and denies ETOH use. She has a history of OSA and is non-compliant with her CPAP.   She is currently chest pain free. She reports increase life stressors at home with 3 teenage grandchildren living with her. She has intermittent LE edema and constipation. She denies recent fever, abdominal pain, orthopnea, or PND.   ED course: Hypertensive, otherwise VSS. Labs notable for Na 133, K 4, Cr 0.98, Hgb  13.7, PLT 245, Ddimer negative, BNP 31, POC trop 0.00> 0.08. EKG with NSR, LVH, biphasic TW in III, appears more inverted on repeat; otherwise no STE/D. CXR with atelectasis. Cardiology asked to see for evaluation of CP.   Past Medical History:  Diagnosis Date  . Arthritis of knee   . Diabetes mellitus, type 2 ([place])   . HLD (hyperlipidemia)   . HTN (hypertension)   . Prolapsed uterus   . Sleep apnea     Past Surgical History:  Procedure Laterality Date  . CARDIAC CATHETERIZATION     2011     Medications Prior to Admission: Prior to Admission medications   Medication Sig Start Date End Date Taking? Authorizing Provider  ACCU-CHEK FASTCLIX LANCETS MISC 1 each by Does not apply route daily. Check sugar daily as needed 09/12/13   Olam Idler, MD  acetaminophen (TYLENOL) 500 MG tablet Take 1,000 mg by mouth every 12 (twelve) hours as needed.    [provider]  amLODipine (NORVASC) 10 MG tablet TAKE 1 TABLET BY MOUTH EVERY DAY 01/26/17   Donnamae Jude, MD  aspirin EC 81 MG tablet Take 1 tablet (81 mg total) by mouth daily. 03/30/14   Olam Idler, MD  B-D UF III MINI PEN NEEDLES 31G X 5 MM MISC ADMINISTER LANTUS DAILY AS DIRECTED 12/03/16   Mayo, Pete Pelt, MD  Blood Glucose Monitoring Suppl (ACCU-CHEK NANO SMARTVIEW) w/Device KIT Use to test blood sugar up to 3 times a day. ICD-10 code: E11.9 07/01/16   MayoPete Pelt, MD  carvedilol (COREG) 12.5 MG tablet  TAKE 1 TABLET BY MOUTH TWICE A DAY WITH MEALS 03/18/17   Mayo, Pete Pelt, MD  glucose blood (ACCU-CHEK AVIVA) test strip Use as instructed 05/08/16   Mayo, Pete Pelt, MD  glucose blood (ACCU-CHEK SMARTVIEW) test strip Use as instructed 06/30/16   Mayo, Pete Pelt, MD  LANTUS SOLOSTAR 100 UNIT/ML Solostar Pen INJECT 50 UNITS INTO THE SKIN IN THE MORNING 02/25/17   Mayo, Pete Pelt, MD  lisinopril (PRINIVIL,ZESTRIL) 40 MG tablet Take 1 tablet (40 mg total) by mouth daily. 07/31/16   Mayo, Pete Pelt, MD  metFORMIN (GLUCOPHAGE) 1000 MG  tablet TAKE 1 TABLET BY MOUTH TWICE DAILY WITH A MEAL 04/19/15   Olam Idler, MD  metFORMIN (GLUCOPHAGE) 1000 MG tablet TAKE 1 TABLET BY MOUTH TWICE A DAY WITH A MEAL 10/13/16   Mayo, Pete Pelt, MD  NOVOLOG FLEXPEN 100 UNIT/ML FlexPen INJECT 20 UNITS INTO THE SKIN 3 TIMES DAILY WITH MEALS. 01/26/17   Mayo, Pete Pelt, MD  rosuvastatin (CRESTOR) 20 MG tablet Take 1 tablet (20 mg total) by mouth daily. 03/28/16   Mayo, Pete Pelt, MD  traMADol (ULTRAM) 50 MG tablet Take 1 tablet (50 mg total) by mouth every 6 (six) hours as needed. 12/28/14   Olam Idler, MD  TRULICITY 4.70 JG/2.8ZM SOPN INJECT 0.75 MG INTO THE SKIN ONCE A WEEK. 01/15/17   Mayo, Pete Pelt, MD     Allergies:    Allergies  Allergen Reactions  . Lipitor [Atorvastatin] Other (See Comments)    Cramping in feet that resolved after stopping med  . Diclofenac Nausea Only and Other (See Comments)    Caused a lot of stomach pain     Social History:   Social History   Socioeconomic History  . Marital status: Divorced    Spouse name: Not on file  . Number of children: Not on file  . Years of education: Not on file  . Highest education level: Not on file  Occupational History  . Not on file  Social Needs  . Financial resource strain: Not on file  . Food insecurity:    Worry: Not on file    Inability: Not on file  . Transportation needs:    Medical: Not on file    Non-medical: Not on file  Tobacco Use  . Smoking status: Former Smoker    Packs/day: 1.00    Years: 33.00    Pack years: 33.00    Types: Cigarettes    Start date: 01/20/1973    Last attempt to quit: 11/04/2006    Years since quitting: 10.4  . Smokeless tobacco: Never Used  Substance and Sexual Activity  . Alcohol use: No    Alcohol/week: 0.0 oz  . Drug use: No  . Sexual activity: Not on file  Lifestyle  . Physical activity:    Days per week: Not on file    Minutes per session: Not on file  . Stress: Not on file  Relationships  . Social connections:     Talks on phone: Not on file    Gets together: Not on file    Attends religious service: Not on file    Active member of club or organization: Not on file    Attends meetings of clubs or organizations: Not on file    Relationship status: Not on file  . Intimate partner violence:    Fear of current or ex partner: Not on file    Emotionally abused: Not on file  Physically abused: Not on file    Forced sexual activity: Not on file  Other Topics Concern  . Not on file  Social History Narrative   Lives with daughter [name] age 58) and 4 grandchildren.   Disability - Arthritis. Does get disability check.   Does not have a living will yet    Family History:   The patient's family history includes Diabetes in her mother; Hypertension in her father and mother.    ROS:  Please see the history of present illness.  All other ROS reviewed and negative.     Physical Exam/Data:   Vitals:   04/24/17 0845 04/24/17 0930 04/24/17 1000 04/24/17 1100  BP: (!) 149/88 (!) 151/85 (!) 156/85 (!) 155/85  Pulse: 67 67 61 69  Resp: '20 20 20 ' (!) 21  Temp:      TempSrc:      SpO2: 92% 97% 95% 94%  Weight:      Height:       No intake or output data in the 24 hours ending 04/24/17 1204 Filed Weights   04/24/17 0000  Weight: 200 lb (90.7 kg)   Body mass index is 33.28 kg/m.  General:  Well nourished, well developed obese female, laying in bed in no acute distress HEENT: anicteric Neck: no JVD Vascular: No carotid bruits; distal pulses 2+ bilaterally  Cardiac:  normal S1, S2; RRR; no murmurs, gallops, or rubs Lungs:  clear to auscultation bilaterally, no wheezing, rhonchi or rales  Abd: NABS, soft, obese, nontender, no hepatomegaly  Ext: trace-1+ b/l LE edema Musculoskeletal:  No deformities, BUE and BLE strength normal and equal Skin: warm and dry  Neuro:  CNs 2-12 intact, no focal abnormalities noted Psych:  Normal affect    EKG:  NSR, LVH, biphasic TW in III, appears more inverted on  repeat; otherwise no STE/D  Relevant CV Studies:  Left heart catheterization 2008: The left coronary artery arises and distributes  normally.  The left main coronary is normal.   The left anterior descending artery and moderate diagonal branch are  both normal.   The left circumflex coronary artery is normal.   The right coronary is a large, dominant vessel and is normal.   Left ventricular angiography was performed in the RAO view.  This  demonstrates normal left ventricular size and contractility with normal  systolic function.  Ejection fraction is estimated at 60%.  There was no  mitral regurgitation or prolapse.   FINAL INTERPRETATION:  1. Normal coronary anatomy.  2. Normal left ventricular function.   Laboratory Data:  Chemistry Recent Labs  Lab 04/24/17 0023  NA 133*  K 4.0  CL 98*  CO2 23  GLUCOSE 231*  BUN 21*  CREATININE 0.98  CALCIUM 9.5  GFRNONAA >60  GFRAA >60  ANIONGAP 12    No results for input(s): PROT, ALBUMIN, AST, ALT, ALKPHOS, BILITOT in the last 168 hours. Hematology Recent Labs  Lab 04/24/17 0023  WBC 8.9  RBC 4.79  HGB 13.7  HCT 42.0  MCV 87.7  MCH 28.6  MCHC 32.6  RDW 13.9  PLT 245   Cardiac EnzymesNo results for input(s): TROPONINI in the last 168 hours.  Recent Labs  Lab 04/24/17 0027 04/24/17 0907  TROPIPOC 0.00 0.08    BNP Recent Labs  Lab 04/24/17 0901  BNP 31.0    DDimer  Recent Labs  Lab 04/24/17 0901  DDIMER <0.27    Radiology/Studies:  Dg Chest 2 View  Result Date: 04/24/2017  CLINICAL DATA:  Central chest pain for 1 day radiating to the arms. EXAM: CHEST - 2 VIEW COMPARISON:  03/05/2009 FINDINGS: Shallow inspiration with probable linear atelectasis in the lung bases. No airspace disease or consolidation. No blunting of costophrenic angles. No pneumothorax. Mediastinal contours appear intact. Heart size and pulmonary vascularity are normal. Degenerative changes in the spine. IMPRESSION: Shallow  inspiration with mild linear atelectasis in the lung bases. Electronically Signed   By: Lucienne Capers M.D.   On: 04/24/2017 01:31    Assessment and Plan:   1. Chest pain: atypical chest pain which occurred at rest and resolved spontaneously. Trop poc 0.00>0.08. EKG with NSR, LVH, biphasic TW in III, appears more inverted on repeat; otherwise no STE/D.  Patient had a positive NST 2008 with subsequent LHC with clean coronaries. No ischemic work-up since that time. Risk factors for ACS include HTN, HLD, poorly controlled DM type 2, OSA with non-compliance with CPAP, and obesity.   - Will check lab troponins - if confirmed elevated, will likely need further ischemic evaluation. Anticipate NST prior to discharge.  - Will check echocardiogram to assess LV function and wall motion abnormalities  2. HTN: BP elevated, likely in the setting of not receiving morning meds - Continue amlodipine, lisinopril, and carvedilol  3. HLD: no recent lipids - Continue statin  4. DM type 2: poorly controlled diabetes with last Hgb A1C 13.7 07/2016. On insulin - Will decrease lantus this evening in anticipation of NPO after MN - ISS  5. OSA: patient reports non-compliance with CPAP - Encouraged use of CPAP  Severity of Illness: The appropriate patient status for this patient is OBSERVATION. Observation status is judged to be reasonable and necessary in order to provide the required intensity of service to ensure the patient's safety. The patient's presenting symptoms, physical exam findings, and initial radiographic and laboratory data in the context of their medical condition is felt to place them at decreased risk for further clinical deterioration. Furthermore, it is anticipated that the patient will be medically stable for discharge from the hospital within 2 midnights of admission. The following factors support the patient status of observation.   " The patient's presenting symptoms include chest  pain . " The physical exam findings include LE edema. " The initial radiographic and laboratory data are Elevated troponin.     For questions or updates, please contact [facility] Please consult [email or web address] for contact info under Cardiology/STEMI.    Signed, [name], PA-C  04/24/2017 12:04 PM  As above, patient seen and examined.  Briefly she is a 62 year old female with past medical history of diabetes mellitus, hypertension, hyperlipidemia, obstructive sleep apnea, arthritis for evaluation of chest pain.  Patient did have a cardiac catheterization in the past (2008) showing normal coronary arteries.  She typically does not have exertional chest pain, dyspnea on exertion, orthopnea, PND or pedal edema.  She developed chest pain yesterday that has waxed and waned.  The pain is substernal and described as an aching sensation.  She notes pain in her arms bilaterally.  The pain is not pleuritic, positional or exertional.  Lasts 5 minutes and resolves but then returns.  One episode was associated with nausea.  She also had diaphoresis.  No dyspnea.  Presently pain-free.  Cardiology asked to evaluate. Electrocardiogram shows sinus rhythm with left ventricular hypertrophy but no ST changes.  Troponins are 0.00 and 0.08.  Hemoglobin 13.7.  D-dimer normal. 1 chest pain-symptoms are atypical.  Troponin is borderline.  We will admit and continue to cycle enzymes.  If they become more positive she would likely need cardiac catheterization.  If no clear trend will arrange stress nuclear study for tomorrow morning.  Continue with aspirin and beta-blocker.  I will add heparin if follow-up enzymes abnormal.  2 hypertension-blood pressure is mildly elevated.  Resume home medications and follow.  Adjust regimen as needed.  3 hyperlipidemia-continue statin.  Kirk Ruths, MD

## 2017-04-24 NOTE — ED Notes (Signed)
Pt brought back to triage for repeat EKG per request of pt. Pt daughter wanting to speak with me requesting why her mother is having CP. Informed her I am unable to explain her mothers cause of CP but EKG, CXR, blood has been reviewed by staff members and is reassuring.

## 2017-04-24 NOTE — ED Provider Notes (Signed)
[facility] DEPARTMENT Provider Note   CSN: [id number] Arrival date & time: 04/23/17  2352     History   Chief Complaint Chief Complaint  Patient presents with  . Chest Pain    HPI Holly Acosta is a 62 y.o. female.  The history is provided by the patient and medical records.  Chest Pain   This is a new problem. The current episode started yesterday. The problem occurs constantly. The problem has been resolved. The pain is present in the substernal region. The pain is at a severity of 8/10. The pain is moderate. The pain radiates to the left arm and right arm. Associated symptoms include cough (dry), diaphoresis, nausea and shortness of breath. Pertinent negatives include no abdominal pain, no back pain, no exertional chest pressure, no fever, no headaches, no hemoptysis, no irregular heartbeat, no leg pain, no lower extremity edema, no palpitations, no sputum production, no syncope and no vomiting. She has tried nothing for the symptoms. The treatment provided no relief.  Her past medical history is significant for diabetes and hypertension.  Pertinent negatives for past medical history include no CAD.    Past Medical History:  Diagnosis Date  . Arthritis of knee   . Diabetes mellitus, type 2 ([place])   . HLD (hyperlipidemia)   . HTN (hypertension)   . Prolapsed uterus   . Sleep apnea     Patient Active Problem List   Diagnosis Date Noted  . Left hip pain 02/23/2014  . Obesity 01/29/2012  . Osteoarthritis, knee 12/11/2011  . COLONIC POLYPS, HYPERPLASTIC 09/13/2009  . THYROMEGALY 08/10/2009  . SLEEP APNEA 08/10/2009  . VENTRAL HERNIA 01/26/2009  . DEPRESSION 07/07/2008  . DM (diabetes mellitus), type 2, uncontrolled ([place]) 05/25/2007  . HYPERCHOLESTEROLEMIA 04/02/2007  . HYPERTENSION, BENIGN ESSENTIAL 04/02/2007  . OA (osteoarthritis) of knee 04/02/2007  . UTERINE PROLAPSE 01/20/2006    Past Surgical History:  Procedure Laterality Date  .  CARDIAC CATHETERIZATION     2011     OB History   None    Obstetric Comments  3 pregnancies - All NSVD         Home Medications    Prior to Admission medications   Medication Sig Start Date End Date Taking? Authorizing Provider  ACCU-CHEK FASTCLIX LANCETS MISC 1 each by Does not apply route daily. Check sugar daily as needed 09/12/13   Olam Idler, MD  acetaminophen (TYLENOL) 500 MG tablet Take 1,000 mg by mouth every 12 (twelve) hours as needed.    [provider]  amLODipine (NORVASC) 10 MG tablet TAKE 1 TABLET BY MOUTH EVERY DAY 01/26/17   Donnamae Jude, MD  aspirin EC 81 MG tablet Take 1 tablet (81 mg total) by mouth daily. 03/30/14   Olam Idler, MD  B-D UF III MINI PEN NEEDLES 31G X 5 MM MISC ADMINISTER LANTUS DAILY AS DIRECTED 12/03/16   Mayo, Pete Pelt, MD  Blood Glucose Monitoring Suppl (ACCU-CHEK NANO SMARTVIEW) w/Device KIT Use to test blood sugar up to 3 times a day. ICD-10 code: E11.9 07/01/16   MayoPete Pelt, MD  carvedilol (COREG) 12.5 MG tablet TAKE 1 TABLET BY MOUTH TWICE A DAY WITH MEALS 03/18/17   Mayo, Pete Pelt, MD  glucose blood (ACCU-CHEK AVIVA) test strip Use as instructed 05/08/16   Mayo, Pete Pelt, MD  glucose blood (ACCU-CHEK SMARTVIEW) test strip Use as instructed 06/30/16   Mayo, Pete Pelt, MD  LANTUS SOLOSTAR 100 UNIT/ML Solostar  Pen INJECT 50 UNITS INTO THE SKIN IN THE MORNING 02/25/17   Mayo, Pete Pelt, MD  lisinopril (PRINIVIL,ZESTRIL) 40 MG tablet Take 1 tablet (40 mg total) by mouth daily. 07/31/16   Mayo, Pete Pelt, MD  metFORMIN (GLUCOPHAGE) 1000 MG tablet TAKE 1 TABLET BY MOUTH TWICE DAILY WITH A MEAL 04/19/15   Olam Idler, MD  metFORMIN (GLUCOPHAGE) 1000 MG tablet TAKE 1 TABLET BY MOUTH TWICE A DAY WITH A MEAL 10/13/16   Mayo, Pete Pelt, MD  NOVOLOG FLEXPEN 100 UNIT/ML FlexPen INJECT 20 UNITS INTO THE SKIN 3 TIMES DAILY WITH MEALS. 01/26/17   Mayo, Pete Pelt, MD  rosuvastatin (CRESTOR) 20 MG tablet Take 1 tablet (20 mg total) by mouth  daily. 03/28/16   Mayo, Pete Pelt, MD  traMADol (ULTRAM) 50 MG tablet Take 1 tablet (50 mg total) by mouth every 6 (six) hours as needed. 12/28/14   Olam Idler, MD  TRULICITY 1.82 XH/3.7JI SOPN INJECT 0.75 MG INTO THE SKIN ONCE A WEEK. 01/15/17   Mayo, Pete Pelt, MD    Family History Family History  Problem Relation Age of Onset  . Diabetes Mother   . Hypertension Mother   . Hypertension Father     Social History Social History   Tobacco Use  . Smoking status: Former Smoker    Packs/day: 1.00    Years: 33.00    Pack years: 33.00    Types: Cigarettes    Start date: 01/20/1973    Last attempt to quit: 11/04/2006    Years since quitting: 10.4  . Smokeless tobacco: Never Used  Substance Use Topics  . Alcohol use: No    Alcohol/week: 0.0 oz  . Drug use: No     Allergies   Lipitor [atorvastatin] and Diclofenac   Review of Systems Review of Systems  Constitutional: Positive for diaphoresis. Negative for chills, fatigue and fever.  HENT: Negative for congestion and rhinorrhea.   Eyes: Negative for visual disturbance.  Respiratory: Positive for cough (dry), chest tightness and shortness of breath. Negative for hemoptysis, sputum production, choking, wheezing and stridor.   Cardiovascular: Positive for chest pain. Negative for palpitations, leg swelling and syncope.  Gastrointestinal: Positive for nausea. Negative for abdominal pain, constipation, diarrhea and vomiting.  Genitourinary: Negative for dysuria and flank pain.  Musculoskeletal: Negative for back pain, neck pain and neck stiffness.  Skin: Negative for rash and wound.  Neurological: Negative for light-headedness and headaches.  Psychiatric/Behavioral: Negative for agitation.  All other systems reviewed and are negative.    Physical Exam Updated Vital Signs BP 116/74   Pulse 60   Temp 98.3 F (36.8 C) (Oral)   Resp 18   Ht _0  (1.651 m)   Wt 90.7 kg (200 lb)   SpO2 98%   BMI 33.28 kg/m   Physical  Exam  Constitutional: She is oriented to person, place, and time. She appears well-developed and well-nourished.  Non-toxic appearance. She does not appear ill. No distress.  HENT:  Head: Normocephalic and atraumatic.  Right Ear: External ear normal.  Left Ear: External ear normal.  Nose: Nose normal.  Mouth/Throat: Oropharynx is clear and moist. No oropharyngeal exudate.  Eyes: Pupils are equal, round, and reactive to light. Conjunctivae and EOM are normal.  Neck: Normal range of motion. Neck supple.  Cardiovascular: Normal rate, regular rhythm and intact distal pulses.  No murmur heard. Pulmonary/Chest: Effort normal. No stridor. No respiratory distress. She has no wheezes. She has rales (mild in the bases).  She exhibits no tenderness.  Abdominal: She exhibits no distension. There is no tenderness. There is no rebound and no guarding.  Musculoskeletal: She exhibits no edema or tenderness.  Neurological: She is alert and oriented to person, place, and time. She has normal reflexes. She exhibits normal muscle tone. Coordination normal.  Skin: Skin is warm. No rash noted. She is not diaphoretic. No erythema.  Nursing note and vitals reviewed.    ED Treatments / Results  Labs (all labs ordered are listed, but only abnormal results are displayed) Labs Reviewed  BASIC METABOLIC PANEL - Abnormal; Notable for the following components:      Result Value   Sodium 133 (*)    Chloride 98 (*)    Glucose, Bld 231 (*)    BUN 21 (*)    All other components within normal limits  TROPONIN I - Abnormal; Notable for the following components:   Troponin I 0.83 (*)    All other components within normal limits  TROPONIN I - Abnormal; Notable for the following components:   Troponin I 1.83 (*)    All other components within normal limits  GLUCOSE, CAPILLARY - Abnormal; Notable for the following components:   Glucose-Capillary 301 (*)    All other components within normal limits  CBG MONITORING, ED  - Abnormal; Notable for the following components:   Glucose-Capillary 237 (*)    All other components within normal limits  CBC  BRAIN NATRIURETIC PEPTIDE  D-DIMER, QUANTITATIVE (NOT AT [facility])  TROPONIN I  HEPARIN LEVEL (UNFRACTIONATED)  HIV ANTIBODY (ROUTINE TESTING)  HEPARIN LEVEL (UNFRACTIONATED)  CBC  CBC  CREATININE, SERUM  BASIC METABOLIC PANEL  LIPID PANEL  PROTIME-INR  I-STAT TROPONIN, ED  I-STAT TROPONIN, ED  I-STAT TROPONIN, ED    EKG EKG Interpretation  Date/Time:  Friday April 24 2017 01:59:47 EDT Ventricular Rate:  56 PR Interval:  154 QRS Duration: 100 QT Interval:  472 QTC Calculation: 455 R Axis:   -20 Text Interpretation:  Sinus bradycardia Left ventricular hypertrophy Abnormal ECG When compared to prior, no significant changes seen.  No STEMI Confirmed by Antony Blackbird [id number]) on 04/24/2017 7:29:39 AM   Radiology Dg Chest 2 View  Result Date: 04/24/2017 CLINICAL DATA:  Central chest pain for 1 day radiating to the arms. EXAM: CHEST - 2 VIEW COMPARISON:  03/05/2009 FINDINGS: Shallow inspiration with probable linear atelectasis in the lung bases. No airspace disease or consolidation. No blunting of costophrenic angles. No pneumothorax. Mediastinal contours appear intact. Heart size and pulmonary vascularity are normal. Degenerative changes in the spine. IMPRESSION: Shallow inspiration with mild linear atelectasis in the lung bases. Electronically Signed   By: Lucienne Capers M.D.   On: 04/24/2017 01:31    Procedures Procedures (including critical care time)  CRITICAL CARE Performed by: Gwenyth Allegra Jt [place] Total critical care time: 35 minutes Critical care time was exclusive of separately billable procedures and treating other patients. Chest pain with rising troponin admitted to cardiology for further management. Critical care was necessary to treat or prevent imminent or life-threatening deterioration. Critical care was time spent personally by me on  the following activities: development of treatment plan with patient and/or surrogate as well as nursing, discussions with consultants, evaluation of patient's response to treatment, examination of patient, obtaining history from patient or surrogate, ordering and performing treatments and interventions, ordering and review of laboratory studies, ordering and review of radiographic studies, pulse oximetry and re-evaluation of patient's condition.   Medications Ordered in ED Medications  rosuvastatin (CRESTOR) tablet 20 mg (has no administration in time range)  amLODipine (NORVASC) tablet 10 mg (10 mg Oral Given 04/24/17 1641)  aspirin EC tablet 81 mg (has no administration in time range)  carvedilol (COREG) tablet 12.5 mg (has no administration in time range)  insulin glargine (LANTUS) injection 25 Units (has no administration in time range)  lisinopril (PRINIVIL,ZESTRIL) tablet 40 mg (40 mg Oral Given 04/24/17 1641)  nitroGLYCERIN (NITROSTAT) SL tablet 0.4 mg (0.4 mg Sublingual Given 04/24/17 1843)  acetaminophen (TYLENOL) tablet 650 mg (has no administration in time range)  ondansetron (ZOFRAN) injection 4 mg (has no administration in time range)  sodium chloride flush (NS) 0.9 % injection 3 mL (has no administration in time range)  sodium chloride flush (NS) 0.9 % injection 3 mL (has no administration in time range)  0.9 %  sodium chloride infusion (has no administration in time range)  insulin aspart (novoLOG) injection 0-5 Units (has no administration in time range)  heparin ADULT infusion 100 units/mL (25000 units/275m sodium chloride 0.45%) (900 Units/hr Intravenous New Bag/Given 04/24/17 1614)  insulin aspart (novoLOG) injection 0-15 Units (has no administration in time range)  heparin bolus via infusion 4,000 Units (4,000 Units Intravenous Bolus from Bag 04/24/17 1615)     Initial Impression / Assessment and Plan / ED Course  I have reviewed the triage vital signs and the nursing  notes.  Pertinent labs & imaging results that were available during my care of the patient were reviewed by me and considered in my medical decision making (see chart for details).     MKAROLYNN INFANTINOis a 62y.o. female with a past medical history significant for hypertension, hypercholesterolemia, and diabetes who presents with chest pain.  Patient reports that starting yesterday she began having intermittent chest pains.  She describes it as a tightness and aching pain that is across her chest and central chest.  She reports that she had associated nausea and diaphoresis.  She reported some mild shortness of breath with the pain.  She reports that the episode lasted for several minutes at a time and then will go away.  She reports the pain radiated to both of her arms equally..  Denied any exertional or pleuritic component to the pain.  She reports that she had pain many years ago and she thought it might be related to anxiety.  She describes it as an 8 out of 10 in severity when it was at its worst but is currently a 3 out of 10.  She denied recent trauma or any recent medication changes.  She denies any recent congestion or rhinorrhea but does report a recent cough.  She denies any urinary symptoms or GI symptoms.   EKG revealed no STEMI and EKG appears similar to prior.  On exam, patient had faint crackles in the bases and chest was nontender.  Lungs otherwise clear.  Abdomen nontender.  Legs not edematous and patient has symmetric pulses in all extremities.  Patient was alert and oriented and was resting comfortably.  Based on patient's exam and history, patient workup to look for a cardiac or pulmonary cause of her symptoms.  Initial troponin was negative.  Chest x-ray showed shallow inspiration with mild atelectasis in the bases.  Will order BNP to look for fluid in the bases as the lungs had some rales.  CBC and BMP showed elevation in glucose but otherwise reassuring.  Heart score calculated  as a 5.  Patient will have a d-dimer  added given her age and the diffuse chest tightness/pain as well as a BNP and repeat troponin.  Anticipate speaking with cardiology after workup is completed to discuss the chest pain with a heart score of 5.    D-dimer not elevated.  BNP not elevated.  Second troponin was more elevated however it was still within normal range.  Given this rise, and her heart score of 5, cardiology was called.  Patient continued to have improved pain.  Cardiology saw the patient and will admit for further management.  Prior to admission, patient had another increase in troponin.  Patient admitted for further management by cardiology.   Final Clinical Impressions(s) / ED Diagnoses   Final diagnoses:  Precordial pain  Elevated troponin    Clinical Impression: 1. Precordial pain   2. Elevated troponin     Disposition: Admit  This note was prepared with assistance of Dragon voice recognition software. Occasional wrong-word or sound-a-like substitutions may have occurred due to the inherent limitations of voice recognition software.      Annleigh Knueppel, Gwenyth Allegra, MD 04/24/17 [phone]

## 2017-04-25 ENCOUNTER — Observation Stay (HOSPITAL_BASED_OUTPATIENT_CLINIC_OR_DEPARTMENT_OTHER): Payer: Medicaid Other

## 2017-04-25 DIAGNOSIS — Z9119 Patient's noncompliance with other medical treatment and regimen: Secondary | ICD-10-CM | POA: Diagnosis not present

## 2017-04-25 DIAGNOSIS — E889 Metabolic disorder, unspecified: Secondary | ICD-10-CM | POA: Diagnosis not present

## 2017-04-25 DIAGNOSIS — I2510 Atherosclerotic heart disease of native coronary artery without angina pectoris: Secondary | ICD-10-CM | POA: Diagnosis not present

## 2017-04-25 DIAGNOSIS — E785 Hyperlipidemia, unspecified: Secondary | ICD-10-CM | POA: Diagnosis present

## 2017-04-25 DIAGNOSIS — Z833 Family history of diabetes mellitus: Secondary | ICD-10-CM | POA: Diagnosis not present

## 2017-04-25 DIAGNOSIS — M1710 Unilateral primary osteoarthritis, unspecified knee: Secondary | ICD-10-CM | POA: Diagnosis present

## 2017-04-25 DIAGNOSIS — I214 Non-ST elevation (NSTEMI) myocardial infarction: Principal | ICD-10-CM

## 2017-04-25 DIAGNOSIS — E669 Obesity, unspecified: Secondary | ICD-10-CM | POA: Diagnosis present

## 2017-04-25 DIAGNOSIS — F05 Delirium due to known physiological condition: Secondary | ICD-10-CM | POA: Diagnosis not present

## 2017-04-25 DIAGNOSIS — Z8249 Family history of ischemic heart disease and other diseases of the circulatory system: Secondary | ICD-10-CM | POA: Diagnosis not present

## 2017-04-25 DIAGNOSIS — G4733 Obstructive sleep apnea (adult) (pediatric): Secondary | ICD-10-CM | POA: Diagnosis present

## 2017-04-25 DIAGNOSIS — I639 Cerebral infarction, unspecified: Secondary | ICD-10-CM | POA: Diagnosis not present

## 2017-04-25 DIAGNOSIS — E1169 Type 2 diabetes mellitus with other specified complication: Secondary | ICD-10-CM | POA: Diagnosis not present

## 2017-04-25 DIAGNOSIS — I25110 Atherosclerotic heart disease of native coronary artery with unstable angina pectoris: Secondary | ICD-10-CM | POA: Diagnosis present

## 2017-04-25 DIAGNOSIS — E7800 Pure hypercholesterolemia, unspecified: Secondary | ICD-10-CM | POA: Diagnosis present

## 2017-04-25 DIAGNOSIS — Z87891 Personal history of nicotine dependence: Secondary | ICD-10-CM | POA: Diagnosis not present

## 2017-04-25 DIAGNOSIS — Z794 Long term (current) use of insulin: Secondary | ICD-10-CM | POA: Diagnosis not present

## 2017-04-25 DIAGNOSIS — R079 Chest pain, unspecified: Secondary | ICD-10-CM

## 2017-04-25 DIAGNOSIS — Z888 Allergy status to other drugs, medicaments and biological substances status: Secondary | ICD-10-CM | POA: Diagnosis not present

## 2017-04-25 DIAGNOSIS — Z6829 Body mass index (BMI) 29.0-29.9, adult: Secondary | ICD-10-CM | POA: Diagnosis not present

## 2017-04-25 DIAGNOSIS — E1165 Type 2 diabetes mellitus with hyperglycemia: Secondary | ICD-10-CM | POA: Diagnosis present

## 2017-04-25 DIAGNOSIS — E119 Type 2 diabetes mellitus without complications: Secondary | ICD-10-CM | POA: Diagnosis not present

## 2017-04-25 DIAGNOSIS — Z79899 Other long term (current) drug therapy: Secondary | ICD-10-CM | POA: Diagnosis not present

## 2017-04-25 DIAGNOSIS — I10 Essential (primary) hypertension: Secondary | ICD-10-CM | POA: Diagnosis present

## 2017-04-25 DIAGNOSIS — Z8673 Personal history of transient ischemic attack (TIA), and cerebral infarction without residual deficits: Secondary | ICD-10-CM | POA: Diagnosis not present

## 2017-04-25 DIAGNOSIS — Z955 Presence of coronary angioplasty implant and graft: Secondary | ICD-10-CM | POA: Diagnosis not present

## 2017-04-25 DIAGNOSIS — E0865 Diabetes mellitus due to underlying condition with hyperglycemia: Secondary | ICD-10-CM | POA: Diagnosis not present

## 2017-04-25 DIAGNOSIS — R748 Abnormal levels of other serum enzymes: Secondary | ICD-10-CM | POA: Diagnosis not present

## 2017-04-25 LAB — BASIC METABOLIC PANEL
Anion gap: 12 (ref 5–15)
BUN: 14 mg/dL (ref 6–20)
CHLORIDE: 98 mmol/L — AB (ref 101–111)
CO2: 23 mmol/L (ref 22–32)
CREATININE: 0.97 mg/dL (ref 0.44–1.00)
Calcium: 9.2 mg/dL (ref 8.9–10.3)
GFR calc non Af Amer: >60 mL/min (ref >60)
Glucose, Bld: 283 mg/dL — ABNORMAL HIGH (ref 65–99)
POTASSIUM: 3.8 mmol/L (ref 3.5–5.1)
Sodium: 133 mmol/L — ABNORMAL LOW (ref 135–145)

## 2017-04-25 LAB — CBC
HEMATOCRIT: 43 % (ref 36.0–46.0)
HEMOGLOBIN: 13.8 g/dL (ref 12.0–15.0)
MCH: 27.9 pg (ref 26.0–34.0)
MCHC: 32.1 g/dL (ref 30.0–36.0)
MCV: 87 fL (ref 78.0–100.0)
Platelets: 263 10*3/uL (ref 150–400)
RBC: 4.94 MIL/uL (ref 3.87–5.11)
RDW: 14.3 % (ref 11.5–15.5)
WBC: 10.4 10*3/uL (ref 4.0–10.5)

## 2017-04-25 LAB — ECHOCARDIOGRAM COMPLETE
Height: 65 in
WEIGHTICAEL: 2944 [oz_av]

## 2017-04-25 LAB — GLUCOSE, CAPILLARY
Glucose-Capillary: 263 mg/dL — ABNORMAL HIGH (ref 65–99)
Glucose-Capillary: 350 mg/dL — ABNORMAL HIGH (ref 65–99)
Glucose-Capillary: 331 mg/dL — ABNORMAL HIGH (ref 65–99)
Glucose-Capillary: 276 mg/dL — ABNORMAL HIGH (ref 65–99)

## 2017-04-25 LAB — HIV ANTIBODY (ROUTINE TESTING W REFLEX): HIV Screen 4th Generation wRfx: NONREACTIVE

## 2017-04-25 LAB — PROTIME-INR
INR: 1.02
Prothrombin Time: 13.3 seconds (ref 11.4–15.2)

## 2017-04-25 LAB — HEPARIN LEVEL (UNFRACTIONATED)
HEPARIN UNFRACTIONATED: 0.29 [IU]/mL — AB (ref 0.30–0.70)
Heparin Unfractionated: <0.1 IU/mL — ABNORMAL LOW (ref 0.30–0.70)
Heparin Unfractionated: 0.31 IU/mL (ref 0.30–0.70)

## 2017-04-25 LAB — LIPID PANEL
CHOL/HDL RATIO: 7.7 ratio
Cholesterol: 230 mg/dL — ABNORMAL HIGH (ref 0–200)
HDL: 30 mg/dL — ABNORMAL LOW (ref >40)
LDL CALC: UNDETERMINED mg/dL (ref 0–99)
TRIGLYCERIDES: 518 mg/dL — AB (ref <150)
VLDL: UNDETERMINED mg/dL (ref 0–40)

## 2017-04-25 LAB — TROPONIN I

## 2017-04-25 MED ORDER — HEPARIN BOLUS VIA INFUSION
2250.0000 [IU] | Freq: Once | INTRAVENOUS | Status: AC
  Administered 2017-04-25: 2250 [IU] via INTRAVENOUS
  Filled 2017-04-25: qty 2250

## 2017-04-25 MED ORDER — HEPARIN BOLUS VIA INFUSION
1100.0000 [IU] | Freq: Once | INTRAVENOUS | Status: AC
  Administered 2017-04-25: 1100 [IU] via INTRAVENOUS
  Filled 2017-04-25: qty 1100

## 2017-04-25 MED ORDER — ROSUVASTATIN CALCIUM 20 MG PO TABS
40.0000 mg | ORAL_TABLET | Freq: Every day | ORAL | Status: DC
  Administered 2017-04-25 – 2017-05-04 (×10): 40 mg via ORAL
  Filled 2017-04-25 (×10): qty 2

## 2017-04-25 NOTE — Progress Notes (Signed)
[place] for heparin Indication: chest pain/ACS  Allergies  Allergen Reactions  . Lipitor [Atorvastatin] Other (See Comments)    Cramping in feet that resolved after stopping med  . Diclofenac Nausea Only and Other (See Comments)    Caused a lot of stomach pain     Patient Measurements: Height: 5\' 5"  (165.1 cm) Weight: 184 lb (83.5 kg) IBW/kg (Calculated) : 57 Heparin Dosing Weight: 75 kg   Vital Signs: Temp: 99.8 F (37.7 C) (04/06 2111) Temp Source: Oral (04/06 2111) BP: 133/79 (04/06 2111) Pulse Rate: 72 (04/06 2111)  Labs: Recent Labs    04/24/17 0023 04/24/17 1253 04/24/17 1610  04/25/17 0058 04/25/17 0258 04/25/17 0605 04/25/17 1323 04/25/17 2122  HGB 13.7  --   --   --   --  13.8  --   --   --   HCT 42.0  --   --   --   --  43.0  --   --   --   PLT 245  --   --   --   --  263  --   --   --   LABPROT  --   --   --   --   --  13.3  --   --   --   INR  --   --   --   --   --  1.02  --   --   --   HEPARINUNFRC  --   --   --    < >  --   --  <0.10* 0.29* 0.31  CREATININE 0.98  --   --   --   --  0.97  --   --   --   TROPONINI  --  0.83* 1.83*  --  >65.00*  --   --   --   --    < > = values in this interval not displayed.    Assessment: 22 YOF with chest pain and elevated troponin to start IV heparin for ACS. She is not on any anticoagulation prior to admission.  -heparin level is now at goal on 1500 units/hr    Goal of Therapy:  Heparin level 0.3-0.7 units/ml Monitor platelets by anticoagulation protocol: Yes   Plan:  -No heparin changes needed -Daily heparin level and CBC  Hildred Laser, PharmD Clinical Pharmacist 04/25/2017 10:20 PM

## 2017-04-25 NOTE — Progress Notes (Signed)
[place] for heparin Indication: chest pain/ACS  Allergies  Allergen Reactions  . Lipitor [Atorvastatin] Other (See Comments)    Cramping in feet that resolved after stopping med  . Diclofenac Nausea Only and Other (See Comments)    Caused a lot of stomach pain     Patient Measurements: Height: 5\' 5"  (165.1 cm) Weight: 184 lb (83.5 kg) IBW/kg (Calculated) : 57 Heparin Dosing Weight: 75 kg   Vital Signs: Temp: 98.9 F (37.2 C) (04/06 0545) Temp Source: Oral (04/06 0545) BP: 141/89 (04/06 0545) Pulse Rate: 79 (04/06 0545)  Labs: Recent Labs    04/24/17 0023 04/24/17 1253 04/24/17 1610 04/24/17 2301 04/25/17 0058 04/25/17 0258 04/25/17 0605  HGB 13.7  --   --   --   --  13.8  --   HCT 42.0  --   --   --   --  43.0  --   PLT 245  --   --   --   --  263  --   LABPROT  --   --   --   --   --  13.3  --   INR  --   --   --   --   --  1.02  --   HEPARINUNFRC  --   --   --  <0.10*  --   --  <0.10*  CREATININE 0.98  --   --   --   --  0.97  --   TROPONINI  --  0.83* 1.83*  --  >65.00*  --   --     Assessment: 61 YOF with chest pain and elevated troponin to start IV heparin for ACS. She is not on any anticoagulation prior to admission. H/H and Plt wnl.    Follow-up heparin level still undetectable. I spoke with the RN, heparin has been running okay, no issues with the infusion.   Goal of Therapy:  Heparin level 0.3-0.7 units/ml Monitor platelets by anticoagulation protocol: Yes   Plan:  Rebolus heparin 2250 units Increase heparin to 1400 units/hr 6 hour heparin level Daily heparin level, CBC Monitor clinical course, s/sx bleeding   Nida Boatman, PharmD PGY1 Acute Care Pharmacy Resident Pager: [phone] 7:41 AM

## 2017-04-25 NOTE — Progress Notes (Addendum)
CRITICAL VALUE ALERT  Critical Value:  Troponin >65.   Date & Time Notied:  04/25/17 0208  Provider Notified: Text Page to Dr. Barrett Henle at [id number] "[id number], M. Critical trop reported >65. Pt denies CP, EKG obtained. IV heparin infusing, cath planned for Mon. Any further orders?"  Orders Received/Actions taken: Call back received at 0231 from Dr. Barrett Henle; no new orders at this time, pt asymptomatic.   Will continue to monitor closely for any acute changes.  Jacqlyn Larsen, RN

## 2017-04-25 NOTE — Progress Notes (Signed)
[facility] for heparin Indication: chest pain/ACS  Allergies  Allergen Reactions  . Lipitor [Atorvastatin] Other (See Comments)    Cramping in feet that resolved after stopping med  . Diclofenac Nausea Only and Other (See Comments)    Caused a lot of stomach pain     Patient Measurements: Height: 5\' 5"  (165.1 cm) Weight: 184 lb 3.2 oz (83.6 kg) IBW/kg (Calculated) : 57 Heparin Dosing Weight: 77 kg   Vital Signs: Temp: 99.2 F (37.3 C) (04/05 2140) Temp Source: Oral (04/05 2140) BP: 144/89 (04/05 2140) Pulse Rate: 76 (04/05 2140)  Labs: Recent Labs    04/24/17 0023 04/24/17 1253 04/24/17 1610 04/24/17 2301  HGB 13.7  --   --   --   HCT 42.0  --   --   --   PLT 245  --   --   --   HEPARINUNFRC  --   --   --  <0.10*  CREATININE 0.98  --   --   --   TROPONINI  --  0.83* 1.83*  --     Assessment: 61 YOF with chest pain and elevated troponin to start IV heparin for ACS. She is not on any anticoagulation prior to admission. H/H and Plt wnl.    Initial heparin level is undetectable. I spoke with the RN, heparin has been running okay, no issues with the infusion.   Goal of Therapy:  Heparin level 0.3-0.7 units/ml Monitor platelets by anticoagulation protocol: Yes   Plan:  -Increase heparin to 1200 units/hr -Daily HL, CBC -Check 6 hour level   Harvel Quale 04/25/2017  12:51 AM

## 2017-04-25 NOTE — Progress Notes (Addendum)
Progress Note  Patient Name: Holly Acosta Date of Encounter: 04/25/2017  Primary Cardiologist: Dr Martinique  Subjective   No chest pain or dyspnea  Inpatient Medications    Scheduled Meds: . amLODipine  10 mg Oral Daily  . aspirin EC  81 mg Oral Daily  . carvedilol  12.5 mg Oral BID WC  . insulin aspart  0-15 Units Subcutaneous TID WC  . insulin aspart  0-5 Units Subcutaneous QHS  . insulin glargine  25 Units Subcutaneous Q2200  . lisinopril  40 mg Oral Daily  . rosuvastatin  20 mg Oral Daily  . sodium chloride flush  3 mL Intravenous Q12H   Continuous Infusions: . sodium chloride    . heparin 1,200 Units/hr (04/25/17 0111)   PRN Meds: sodium chloride, acetaminophen, nitroGLYCERIN, ondansetron (ZOFRAN) IV, sodium chloride flush   Vital Signs    Vitals:   04/24/17 2000 04/24/17 2140 04/25/17 0543 04/25/17 0545  BP:  (!) 144/89  (!) 141/89  Pulse:  76  79  Resp:      Temp:  99.2 F (37.3 C)  98.9 F (37.2 C)  TempSrc:  Oral  Oral  SpO2:  93%  93%  Weight: 184 lb 3.2 oz (83.6 kg)  184 lb (83.5 kg)   Height:        Intake/Output Summary (Last 24 hours) at 04/25/2017 0708 Last data filed at 04/25/2017 0549 Gross per 24 hour  Intake 462.35 ml  Output 2200 ml  Net -1737.65 ml   Filed Weights   04/24/17 0000 04/24/17 2000 04/25/17 0543  Weight: 200 lb (90.7 kg) 184 lb 3.2 oz (83.6 kg) 184 lb (83.5 kg)    Telemetry    Sinus- Personally Reviewed  ECG    NSR, LAD, LVH - Personally Reviewed  Physical Exam   GEN: No acute distress.   Neck: No JVD Cardiac: RRR, no murmurs, rubs, or gallops.  Respiratory: Clear to auscultation bilaterally. GI: Soft, nontender, non-distended  MS: No edema Neuro:  Nonfocal  Psych: Normal affect   Labs    Chemistry Recent Labs  Lab 04/24/17 0023 04/25/17 0258  NA 133* 133*  K 4.0 3.8  CL 98* 98*  CO2 23 23  GLUCOSE 231* 283*  BUN 21* 14  CREATININE 0.98 0.97  CALCIUM 9.5 9.2  GFRNONAA >60 >60  GFRAA >60 >60    ANIONGAP 12 12     Hematology Recent Labs  Lab 04/24/17 0023 04/25/17 0258  WBC 8.9 10.4  RBC 4.79 4.94  HGB 13.7 13.8  HCT 42.0 43.0  MCV 87.7 87.0  MCH 28.6 27.9  MCHC 32.6 32.1  RDW 13.9 14.3  PLT 245 263    Cardiac Enzymes Recent Labs  Lab 04/24/17 1253 04/24/17 1610 04/25/17 0058  TROPONINI 0.83* 1.83* >65.00*    Recent Labs  Lab 04/24/17 0027 04/24/17 0907  TROPIPOC 0.00 0.08     BNP Recent Labs  Lab 04/24/17 0901  BNP 31.0     DDimer  Recent Labs  Lab 04/24/17 0901  DDIMER <0.27     Radiology    Dg Chest 2 View  Result Date: 04/24/2017 CLINICAL DATA:  Central chest pain for 1 day radiating to the arms. EXAM: CHEST - 2 VIEW COMPARISON:  03/05/2009 FINDINGS: Shallow inspiration with probable linear atelectasis in the lung bases. No airspace disease or consolidation. No blunting of costophrenic angles. No pneumothorax. Mediastinal contours appear intact. Heart size and pulmonary vascularity are normal. Degenerative changes in the spine.  IMPRESSION: Shallow inspiration with mild linear atelectasis in the lung bases. Electronically Signed   By: Lucienne Capers M.D.   On: 04/24/2017 01:31     Patient Profile     62 y.o. female admitted with NSTEMI  Assessment & Plan    1 non-ST elevation myocardial infarction-patient has ruled in.  She is now pain-free and she does not have ST elevation.  She will require cardiac catheterization on Monday.  The risks and benefits including myocardial infarction, CVA and death discussed and she agrees to proceed.  Plan to continue aspirin, heparin, statin and beta-blocker. Echo for LV function.  2 hyperlipidemia-given coronary artery disease I will increase Crestor to 40 mg daily.  She will need lipids and liver in 4 weeks.    3 hypertension-blood pressure is mildly elevated.  Home medications to be resumed this morning. We will adjust regimen based on follow-up readings.  4 diabetes mellitus-follow CBGs.  For  questions or updates, please contact [place] Please consult [email or web address] for contact info under Cardiology/STEMI.      Signed, Kirk Ruths, MD  04/25/2017, 7:08 AM

## 2017-04-25 NOTE — Progress Notes (Signed)
  Echocardiogram 2D Echocardiogram has been performed.  Holly Acosta 04/25/2017, 10:41 AM

## 2017-04-25 NOTE — Progress Notes (Signed)
[place] for heparin Indication: chest pain/ACS  Allergies  Allergen Reactions  . Lipitor [Atorvastatin] Other (See Comments)    Cramping in feet that resolved after stopping med  . Diclofenac Nausea Only and Other (See Comments)    Caused a lot of stomach pain     Patient Measurements: Height: 5\' 5"  (165.1 cm) Weight: 184 lb (83.5 kg) IBW/kg (Calculated) : 57 Heparin Dosing Weight: 75 kg   Vital Signs: Temp: 99.5 F (37.5 C) (04/06 1427) Temp Source: Oral (04/06 1427) BP: 118/77 (04/06 1427) Pulse Rate: 81 (04/06 1427)  Labs: Recent Labs    04/24/17 0023 04/24/17 1253 04/24/17 1610 04/24/17 2301 04/25/17 0058 04/25/17 0258 04/25/17 0605 04/25/17 1323  HGB 13.7  --   --   --   --  13.8  --   --   HCT 42.0  --   --   --   --  43.0  --   --   PLT 245  --   --   --   --  263  --   --   LABPROT  --   --   --   --   --  13.3  --   --   INR  --   --   --   --   --  1.02  --   --   HEPARINUNFRC  --   --   --  <0.10*  --   --  <0.10* 0.29*  CREATININE 0.98  --   --   --   --  0.97  --   --   TROPONINI  --  0.83* 1.83*  --  >65.00*  --   --   --     Assessment: 38 YOF with chest pain and elevated troponin to start IV heparin for ACS. She is not on any anticoagulation prior to admission. H/H and Plt wnl. No bleeding per RN.   Heparin level slightly subtherapeutic after increase.   Goal of Therapy:  Heparin level 0.3-0.7 units/ml Monitor platelets by anticoagulation protocol: Yes   Plan:  Rebolus heparin 1100 units Increase heparin to 1500 units/hr 6 hour heparin level Daily heparin level, CBC Monitor clinical course, s/sx bleeding   Nida Boatman, PharmD PGY1 Acute Care Pharmacy Resident Pager: [phone] 2:51 PM 04/25/2017

## 2017-04-26 LAB — CBC
HCT: 42.1 % (ref 36.0–46.0)
Hemoglobin: 13.4 g/dL (ref 12.0–15.0)
MCH: 27.8 pg (ref 26.0–34.0)
MCHC: 31.8 g/dL (ref 30.0–36.0)
MCV: 87.3 fL (ref 78.0–100.0)
PLATELETS: 239 10*3/uL (ref 150–400)
RBC: 4.82 MIL/uL (ref 3.87–5.11)
RDW: 14.1 % (ref 11.5–15.5)
WBC: 10.5 10*3/uL (ref 4.0–10.5)

## 2017-04-26 LAB — BASIC METABOLIC PANEL
Anion gap: 12 (ref 5–15)
BUN: 15 mg/dL (ref 6–20)
CHLORIDE: 101 mmol/L (ref 101–111)
CO2: 21 mmol/L — AB (ref 22–32)
CREATININE: 0.96 mg/dL (ref 0.44–1.00)
Calcium: 9.3 mg/dL (ref 8.9–10.3)
GFR calc Af Amer: >60 mL/min (ref >60)
GFR calc non Af Amer: >60 mL/min (ref >60)
GLUCOSE: 317 mg/dL — AB (ref 65–99)
Potassium: 4.3 mmol/L (ref 3.5–5.1)
Sodium: 134 mmol/L — ABNORMAL LOW (ref 135–145)

## 2017-04-26 LAB — GLUCOSE, CAPILLARY
Glucose-Capillary: 282 mg/dL — ABNORMAL HIGH (ref 65–99)
Glucose-Capillary: 289 mg/dL — ABNORMAL HIGH (ref 65–99)
Glucose-Capillary: 293 mg/dL — ABNORMAL HIGH (ref 65–99)
Glucose-Capillary: 345 mg/dL — ABNORMAL HIGH (ref 65–99)

## 2017-04-26 LAB — HEPARIN LEVEL (UNFRACTIONATED): Heparin Unfractionated: 0.37 IU/mL (ref 0.30–0.70)

## 2017-04-26 MED ORDER — SODIUM CHLORIDE 0.9 % IV SOLN: 250.0000 mL | INTRAVENOUS | Status: DC | PRN

## 2017-04-26 MED ORDER — SODIUM CHLORIDE 0.9% FLUSH: 3.0000 mL | INTRAVENOUS | Status: DC | PRN

## 2017-04-26 MED ORDER — SODIUM CHLORIDE 0.9% FLUSH
3.0000 mL | Freq: Two times a day (BID) | INTRAVENOUS | Status: DC
  Administered 2017-04-26 (×2): 3 mL via INTRAVENOUS

## 2017-04-26 MED ORDER — MAGNESIUM HYDROXIDE 400 MG/5ML PO SUSP
30.0000 mL | Freq: Every day | ORAL | Status: DC | PRN
  Administered 2017-04-26 – 2017-05-01 (×2): 30 mL via ORAL
  Filled 2017-04-26 (×3): qty 30

## 2017-04-26 MED ORDER — SODIUM CHLORIDE 0.9 % WEIGHT BASED INFUSION
1.0000 mL/kg/h | INTRAVENOUS | Status: DC
  Administered 2017-04-27: 1 mL/kg/h via INTRAVENOUS

## 2017-04-26 MED ORDER — SODIUM CHLORIDE 0.9 % WEIGHT BASED INFUSION
3.0000 mL/kg/h | INTRAVENOUS | Status: DC
  Administered 2017-04-27: 3 mL/kg/h via INTRAVENOUS

## 2017-04-26 MED ORDER — ASPIRIN 81 MG PO CHEW
81.0000 mg | CHEWABLE_TABLET | ORAL | Status: AC
  Administered 2017-04-27: 81 mg via ORAL
  Filled 2017-04-26: qty 1

## 2017-04-26 NOTE — Progress Notes (Signed)
Progress Note  Patient Name: ARIBA LEHNEN Date of Encounter: 04/26/2017  Primary Cardiologist: Dr [place]  Subjective   Pt denies CP or dyspnea  Inpatient Medications    Scheduled Meds: . amLODipine  10 mg Oral Daily  . [START ON 04/27/2017] aspirin  81 mg Oral Pre-Cath  . aspirin EC  81 mg Oral Daily  . carvedilol  12.5 mg Oral BID WC  . insulin aspart  0-15 Units Subcutaneous TID WC  . insulin aspart  0-5 Units Subcutaneous QHS  . insulin glargine  25 Units Subcutaneous Q2200  . lisinopril  40 mg Oral Daily  . rosuvastatin  40 mg Oral Daily  . sodium chloride flush  3 mL Intravenous Q12H  . sodium chloride flush  3 mL Intravenous Q12H   Continuous Infusions: . sodium chloride    . sodium chloride    . [START ON 04/27/2017] sodium chloride     Followed by  . [START ON 04/27/2017] sodium chloride    . heparin 1,500 Units/hr (04/26/17 0232)   PRN Meds: sodium chloride, sodium chloride, acetaminophen, nitroGLYCERIN, ondansetron (ZOFRAN) IV, sodium chloride flush, sodium chloride flush   Vital Signs    Vitals:   04/25/17 1709 04/25/17 2111 04/26/17 0618 04/26/17 0622  BP: (!) 141/86 133/79 136/78   Pulse: 76 72 77   Resp:      Temp:  99.8 F (37.7 C) 99 F (37.2 C)   TempSrc:  Oral Oral   SpO2:  90% 92%   Weight:    183 lb 9.6 oz (83.3 kg)  Height:        Intake/Output Summary (Last 24 hours) at 04/26/2017 0809 Last data filed at 04/26/2017 4854 Gross per 24 hour  Intake 420 ml  Output 1600 ml  Net -1180 ml   Filed Weights   04/24/17 2000 04/25/17 0543 04/26/17 0622  Weight: 184 lb 3.2 oz (83.6 kg) 184 lb (83.5 kg) 183 lb 9.6 oz (83.3 kg)    Telemetry    Sinus- Personally Reviewed   Physical Exam   GEN: WD/WN No acute distress.   Neck: No JVD, supple Cardiac: RRR Respiratory: CTA GI: Soft, nontender, non-distended, no masses  MS: No edema Neuro:  Grossly intact   Labs    Chemistry Recent Labs  Lab 04/24/17 0023 04/25/17 0258 04/26/17 0406    NA 133* 133* 134*  K 4.0 3.8 4.3  CL 98* 98* 101  CO2 23 23 21*  GLUCOSE 231* 283* 317*  BUN 21* 14 15  CREATININE 0.98 0.97 0.96  CALCIUM 9.5 9.2 9.3  GFRNONAA >60 >60 >60  GFRAA >60 >60 >60  ANIONGAP 12 12 12      Hematology Recent Labs  Lab 04/24/17 0023 04/25/17 0258 04/26/17 0406  WBC 8.9 10.4 10.5  RBC 4.79 4.94 4.82  HGB 13.7 13.8 13.4  HCT 42.0 43.0 42.1  MCV 87.7 87.0 87.3  MCH 28.6 27.9 27.8  MCHC 32.6 32.1 31.8  RDW 13.9 14.3 14.1  PLT 245 263 239    Cardiac Enzymes Recent Labs  Lab 04/24/17 1253 04/24/17 1610 04/25/17 0058  TROPONINI 0.83* 1.83* >65.00*    Recent Labs  Lab 04/24/17 0027 04/24/17 0907  TROPIPOC 0.00 0.08     BNP Recent Labs  Lab 04/24/17 0901  BNP 31.0     DDimer  Recent Labs  Lab 04/24/17 0901  DDIMER <0.27     Patient Profile     62 y.o. female admitted with NSTEMI  Assessment &  Plan    1 non-ST elevation myocardial infarction-Pt remains pain free. She has ruled in; plan cardiac cath in AM. The risks and benefits including myocardial infarction, CVA and death discussed and she agrees to proceed.  Plan to continue aspirin, heparin, statin and beta-blocker. Echo shows normal LV function.  2 hyperlipidemia-continue statin.  3 hypertension-blood pressure is controlled; continue present meds.  4 diabetes mellitus-continue meds and follow CBGs.  For questions or updates, please contact [facility] Please consult [email or web address] for contact info under Cardiology/STEMI.      Signed, Kirk Ruths, MD  04/26/2017, 8:09 AM

## 2017-04-26 NOTE — Plan of Care (Signed)
Pt is aware to call for assistance as needed to get to the BR. Pt educated regarding her blood sugars and diet and exercise to help reduce her blood sugars, offered her a Dietician to instruct on healthy foods and meal plans, also foods to help lower her cholesterol. Pt and family very appreciative and will work with patient to help her to lower her cholesterol and blood sugars. Explained to patient about her troponin levels and the effects her elevated lipid levels and blood sugars have on her heart and also reviewed what a heart cath was and rationale of doing the test and depending on the results what different treatments could be initiated and patient and family verbalized understanding. Pt given written material on the heart cath and the aftercare, will continue to review and answer questions.

## 2017-04-26 NOTE — Progress Notes (Addendum)
[name] for heparin Indication: chest pain/ACS  Allergies  Allergen Reactions  . Lipitor [Atorvastatin] Other (See Comments)    Cramping in feet that resolved after stopping med  . Diclofenac Nausea Only and Other (See Comments)    Caused a lot of stomach pain     Patient Measurements: Height: 5\' 5"  (165.1 cm) Weight: 183 lb 9.6 oz (83.3 kg) IBW/kg (Calculated) : 57 Heparin Dosing Weight: 75 kg   Vital Signs: Temp: 99 F (37.2 C) (04/07 0618) Temp Source: Oral (04/07 0618) BP: 136/78 (04/07 0618) Pulse Rate: 77 (04/07 0618)  Labs: Recent Labs    04/24/17 0023 04/24/17 1253 04/24/17 1610  04/25/17 0058 04/25/17 0258  04/25/17 1323 04/25/17 2122 04/26/17 0406  HGB 13.7  --   --   --   --  13.8  --   --   --  13.4  HCT 42.0  --   --   --   --  43.0  --   --   --  42.1  PLT 245  --   --   --   --  263  --   --   --  239  LABPROT  --   --   --   --   --  13.3  --   --   --   --   INR  --   --   --   --   --  1.02  --   --   --   --   HEPARINUNFRC  --   --   --    < >  --   --    < > 0.29* 0.31 0.37  CREATININE 0.98  --   --   --   --  0.97  --   --   --  0.96  TROPONINI  --  0.83* 1.83*  --  >65.00*  --   --   --   --   --    < > = values in this interval not displayed.    Assessment: 36 YOF with chest pain and elevated troponin to start IV heparin for ACS. She is not on any anticoagulation prior to admission.  Heparin level remains at goal, 0.37, no signs/sx of bleeding documented.  Cardiac cath scheduled for Monday   Goal of Therapy:  Heparin level 0.3-0.7 units/ml Monitor platelets by anticoagulation protocol: Yes   Plan:  Continue heparin 1500 units/hr Daily heparin level, CBC Monitor clinical course, s/sx bleeding  Nida Boatman, PharmD PGY1 Acute Care Pharmacy Resident Pager: [phone] 04/26/2017 7:43 AM

## 2017-04-27 ENCOUNTER — Encounter (HOSPITAL_COMMUNITY)
Admission: EM | Disposition: A | Discharge status: Skilled Nursing Facility | Payer: Self-pay | Source: Home / Self Care | Attending: Cardiology

## 2017-04-27 DIAGNOSIS — E119 Type 2 diabetes mellitus without complications: Secondary | ICD-10-CM

## 2017-04-27 DIAGNOSIS — I2510 Atherosclerotic heart disease of native coronary artery without angina pectoris: Secondary | ICD-10-CM

## 2017-04-27 DIAGNOSIS — I10 Essential (primary) hypertension: Secondary | ICD-10-CM

## 2017-04-27 DIAGNOSIS — I214 Non-ST elevation (NSTEMI) myocardial infarction: Secondary | ICD-10-CM

## 2017-04-27 DIAGNOSIS — E785 Hyperlipidemia, unspecified: Secondary | ICD-10-CM

## 2017-04-27 HISTORY — PX: LEFT HEART CATH AND CORONARY ANGIOGRAPHY: CATH118249

## 2017-04-27 HISTORY — PX: CORONARY STENT INTERVENTION: CATH118234

## 2017-04-27 LAB — GLUCOSE, CAPILLARY
GLUCOSE-CAPILLARY: 198 mg/dL — AB (ref 65–99)
GLUCOSE-CAPILLARY: 285 mg/dL — AB (ref 65–99)
Glucose-Capillary: 263 mg/dL — ABNORMAL HIGH (ref 65–99)
Glucose-Capillary: 248 mg/dL — ABNORMAL HIGH (ref 65–99)

## 2017-04-27 LAB — CBC
HCT: 40.2 % (ref 36.0–46.0)
HEMOGLOBIN: 12.9 g/dL (ref 12.0–15.0)
MCH: 28.2 pg (ref 26.0–34.0)
MCHC: 32.1 g/dL (ref 30.0–36.0)
MCV: 87.8 fL (ref 78.0–100.0)
Platelets: 238 10*3/uL (ref 150–400)
RBC: 4.58 MIL/uL (ref 3.87–5.11)
RDW: 14.2 % (ref 11.5–15.5)
WBC: 10.7 10*3/uL — ABNORMAL HIGH (ref 4.0–10.5)

## 2017-04-27 LAB — POCT ACTIVATED CLOTTING TIME: Activated Clotting Time: 323 seconds

## 2017-04-27 LAB — HEPARIN LEVEL (UNFRACTIONATED): HEPARIN UNFRACTIONATED: 0.3 [IU]/mL (ref 0.30–0.70)

## 2017-04-27 SURGERY — LEFT HEART CATH AND CORONARY ANGIOGRAPHY
Anesthesia: LOCAL

## 2017-04-27 MED ORDER — SODIUM CHLORIDE 0.9 % IV SOLN: INTRAVENOUS | Status: AC

## 2017-04-27 MED ORDER — HEPARIN (PORCINE) IN NACL 2-0.9 UNIT/ML-% IJ SOLN
INTRAMUSCULAR | Status: AC
  Filled 2017-04-27: qty 500

## 2017-04-27 MED ORDER — HEART ATTACK BOUNCING BOOK
Freq: Once | Status: AC
  Administered 2017-04-27: 23:00:00
  Filled 2017-04-27: qty 1

## 2017-04-27 MED ORDER — VERAPAMIL HCL 2.5 MG/ML IV SOLN
INTRAVENOUS | Status: DC | PRN
  Administered 2017-04-27: 10 mL via INTRA_ARTERIAL

## 2017-04-27 MED ORDER — TIROFIBAN (AGGRASTAT) BOLUS VIA INFUSION
INTRAVENOUS | Status: DC | PRN
  Administered 2017-04-27: 2085 ug via INTRAVENOUS

## 2017-04-27 MED ORDER — IOHEXOL 350 MG/ML SOLN
INTRAVENOUS | Status: DC | PRN
  Administered 2017-04-27: 105 mL via INTRA_ARTERIAL

## 2017-04-27 MED ORDER — ONDANSETRON HCL 4 MG/2ML IJ SOLN
INTRAMUSCULAR | Status: AC
  Filled 2017-04-27: qty 2

## 2017-04-27 MED ORDER — SODIUM CHLORIDE 0.9% FLUSH: 3.0000 mL | INTRAVENOUS | Status: DC | PRN

## 2017-04-27 MED ORDER — SODIUM CHLORIDE 0.9% FLUSH: 3.0000 mL | Freq: Two times a day (BID) | INTRAVENOUS | Status: DC

## 2017-04-27 MED ORDER — TICAGRELOR 90 MG PO TABS
ORAL_TABLET | ORAL | Status: AC
  Filled 2017-04-27: qty 2

## 2017-04-27 MED ORDER — ACETAMINOPHEN 325 MG PO TABS: 650.0000 mg | ORAL_TABLET | ORAL | Status: DC | PRN

## 2017-04-27 MED ORDER — FENTANYL CITRATE (PF) 100 MCG/2ML IJ SOLN
INTRAMUSCULAR | Status: AC
  Filled 2017-04-27: qty 2

## 2017-04-27 MED ORDER — NITROGLYCERIN 1 MG/10 ML FOR IR/CATH LAB
INTRA_ARTERIAL | Status: DC | PRN
  Administered 2017-04-27: 200 ug via INTRACORONARY

## 2017-04-27 MED ORDER — HEPARIN SODIUM (PORCINE) 1000 UNIT/ML IJ SOLN
INTRAMUSCULAR | Status: DC | PRN
  Administered 2017-04-27: 6000 [IU] via INTRAVENOUS
  Administered 2017-04-27: 4000 [IU] via INTRAVENOUS

## 2017-04-27 MED ORDER — LIDOCAINE HCL (PF) 1 % IJ SOLN
INTRAMUSCULAR | Status: DC | PRN
  Administered 2017-04-27: 2 mL

## 2017-04-27 MED ORDER — TIROFIBAN HCL IN NACL 5-0.9 MG/100ML-% IV SOLN
INTRAVENOUS | Status: AC
  Filled 2017-04-27: qty 100

## 2017-04-27 MED ORDER — ASPIRIN 81 MG PO CHEW: 81.0000 mg | CHEWABLE_TABLET | Freq: Every day | ORAL | Status: DC

## 2017-04-27 MED ORDER — FENTANYL CITRATE (PF) 100 MCG/2ML IJ SOLN
INTRAMUSCULAR | Status: DC | PRN
  Administered 2017-04-27: 25 ug via INTRAVENOUS

## 2017-04-27 MED ORDER — TICAGRELOR 90 MG PO TABS
ORAL_TABLET | ORAL | Status: DC | PRN
  Administered 2017-04-27: 180 mg via ORAL

## 2017-04-27 MED ORDER — SODIUM CHLORIDE 0.9 % IV SOLN: 250.0000 mL | INTRAVENOUS | Status: DC | PRN

## 2017-04-27 MED ORDER — VERAPAMIL HCL 2.5 MG/ML IV SOLN
INTRAVENOUS | Status: AC
  Filled 2017-04-27: qty 2

## 2017-04-27 MED ORDER — LIDOCAINE HCL 1 % IJ SOLN
INTRAMUSCULAR | Status: AC
  Filled 2017-04-27: qty 20

## 2017-04-27 MED ORDER — ONDANSETRON HCL 4 MG/2ML IJ SOLN
INTRAMUSCULAR | Status: DC | PRN
  Administered 2017-04-27 (×2): 4 mg via INTRAVENOUS

## 2017-04-27 MED ORDER — LABETALOL HCL 5 MG/ML IV SOLN: 10.0000 mg | INTRAVENOUS | Status: AC | PRN

## 2017-04-27 MED ORDER — TICAGRELOR 90 MG PO TABS
90.0000 mg | ORAL_TABLET | Freq: Two times a day (BID) | ORAL | Status: DC
  Administered 2017-04-28 – 2017-05-04 (×14): 90 mg via ORAL
  Filled 2017-04-27 (×14): qty 1

## 2017-04-27 MED ORDER — ONDANSETRON HCL 4 MG/2ML IJ SOLN
4.0000 mg | Freq: Four times a day (QID) | INTRAMUSCULAR | Status: DC | PRN
  Administered 2017-04-28 – 2017-05-04 (×4): 4 mg via INTRAVENOUS
  Filled 2017-04-27 (×4): qty 2

## 2017-04-27 MED ORDER — ANGIOPLASTY BOOK
Freq: Once | Status: AC
  Administered 2017-04-27: 23:00:00
  Filled 2017-04-27: qty 1

## 2017-04-27 MED ORDER — MIDAZOLAM HCL 2 MG/2ML IJ SOLN
INTRAMUSCULAR | Status: DC | PRN
  Administered 2017-04-27: 1 mg via INTRAVENOUS

## 2017-04-27 MED ORDER — HEPARIN SODIUM (PORCINE) 1000 UNIT/ML IJ SOLN
INTRAMUSCULAR | Status: AC
  Filled 2017-04-27: qty 1

## 2017-04-27 MED ORDER — MIDAZOLAM HCL 2 MG/2ML IJ SOLN
INTRAMUSCULAR | Status: AC
  Filled 2017-04-27: qty 2

## 2017-04-27 MED ORDER — HYDRALAZINE HCL 20 MG/ML IJ SOLN: 5.0000 mg | INTRAMUSCULAR | Status: AC | PRN

## 2017-04-27 MED ORDER — HEPARIN (PORCINE) IN NACL 2-0.9 UNIT/ML-% IJ SOLN
INTRAMUSCULAR | Status: DC | PRN
  Administered 2017-04-27 (×2): 500 mL

## 2017-04-27 MED ORDER — NITROGLYCERIN 1 MG/10 ML FOR IR/CATH LAB
INTRA_ARTERIAL | Status: AC
  Filled 2017-04-27: qty 10

## 2017-04-27 SURGICAL SUPPLY — 23 items
BALLN SAPPHIRE 3.0X15 (BALLOONS) ×2
BALLN ~~LOC~~ EMERGE MR 3.75X8 (BALLOONS) ×2
BALLOON SAPPHIRE 3.0X15 (BALLOONS) IMPLANT
BALLOON ~~LOC~~ EMERGE MR 3.75X8 (BALLOONS) IMPLANT
BAND CMPR LRG ZPHR (HEMOSTASIS) ×1
BAND ZEPHYR COMPRESS 30 LONG (HEMOSTASIS) ×1 IMPLANT
CATH INFINITI 5 FR JL3.5 (CATHETERS) ×1 IMPLANT
CATH INFINITI 5FR ANG PIGTAIL (CATHETERS) ×1 IMPLANT
CATH INFINITI JR4 5F (CATHETERS) ×1 IMPLANT
CATH LAUNCHER 6FR EBU3.5 (CATHETERS) ×1 IMPLANT
GUIDEWIRE INQWIRE 1.5J.035X260 (WIRE) IMPLANT
INQWIRE 1.5J .035X260CM (WIRE) ×2
KIT ENCORE 26 ADVANTAGE (KITS) ×1 IMPLANT
KIT HEART LEFT (KITS) ×2 IMPLANT
KIT HEMO VALVE WATCHDOG (MISCELLANEOUS) ×1 IMPLANT
NDL PERC 21GX4CM (NEEDLE) IMPLANT
NEEDLE PERC 21GX4CM (NEEDLE) ×2 IMPLANT
PACK CARDIAC CATHETERIZATION (CUSTOM PROCEDURE TRAY) ×2 IMPLANT
SHEATH RAIN RADIAL 21G 6FR (SHEATH) ×1 IMPLANT
STENT SIERRA 3.50 X 18 MM (Permanent Stent) ×1 IMPLANT
TRANSDUCER W/STOPCOCK (MISCELLANEOUS) ×2 IMPLANT
TUBING CIL FLEX 10 FLL-RA (TUBING) ×2 IMPLANT
WIRE HI TORQ VERSACORE-J 145CM (WIRE) ×1 IMPLANT

## 2017-04-27 NOTE — Interval H&P Note (Signed)
Cath Lab Visit (complete for each Cath Lab visit)  Clinical Evaluation Leading to the Procedure:   ACS: Yes.    Non-ACS:    Anginal Classification: CCS IV  Anti-ischemic medical therapy: Minimal Therapy (1 class of medications)  Non-Invasive Test Results: No non-invasive testing performed  Prior CABG: No previous CABG      History and Physical Interval Note:  04/27/2017 11:44 AM  Holly Acosta  has presented today for surgery, with the diagnosis of Nstemi  The various methods of treatment have been discussed with the patient and family. After consideration of risks, benefits and other options for treatment, the patient has consented to  Procedure(s): LEFT HEART CATH AND CORONARY ANGIOGRAPHY (N/A) as a surgical intervention .  The patient's history has been reviewed, patient examined, no change in status, stable for surgery.  I have reviewed the patient's chart and labs.  Questions were answered to the patient's satisfaction.     Larae Grooms

## 2017-04-27 NOTE — Care Management Note (Addendum)
Case Management Note  Patient Details  Name: Holly Acosta MRN: [id number] Date of Birth: 1955-09-08  Subjective/Objective: Pt presented for Chest Pain-s/p cath with DES. Plan for home on [place]. Pt has Medicaid and cost is usually no more than $3.00.                    Action/Plan: CM will provide pt with 30 day free card. CM will also check patients pharmacy to make sure in stock. No further needs from CM at this time.   Expected Discharge Date:                  Expected Discharge Plan:  Home/Self Care  In-House Referral:  NA  Discharge planning Services  CM Consult, Medication Assistance  Post Acute Care Choice:  NA Choice offered to:  NA  DME Arranged:  N/A DME Agency:  NA  HH Arranged:  NA HH Agency:  NA  Status of Service:  Completed, signed off  If discussed at [facility] of Stay Meetings, dates discussed:    Additional Comments: 1611 04-27-17 [name], RN,BSN [phone] [facility] has medication available. No further needs from CM at this time.  [name], RN 04/27/2017, 2:37 PM

## 2017-04-27 NOTE — Progress Notes (Signed)
Patient received on unit form cath holding very sedated, arouseable to strong verbal stimulus.  Report from holding was that she was like that when they received her.  Patient able to answer orientation questions appropriately, but incapable of remaining awake.  Observed closely, aspiration precautions, keep NPO until awake and alert.  Family instructed.

## 2017-04-27 NOTE — Progress Notes (Signed)
Cardiologist:  [place]   Subjective:  Denies SSCP, palpitations or Dyspnea   Objective:  Vitals:   04/26/17 1538 04/26/17 1728 04/26/17 1924 04/27/17 0332  BP: 130/73 (!) 123/91 124/73 135/83  Pulse: 78 85 73 77  Resp:   16 (!) 24  Temp: 98.2 F (36.8 C)  98.3 F (36.8 C) 98.5 F (36.9 C)  TempSrc: Oral  Oral Oral  SpO2: (!) 89%  91% 94%  Weight:    183 lb 12.8 oz (83.4 kg)  Height:        Intake/Output from previous day:  Intake/Output Summary (Last 24 hours) at 04/27/2017 3329 Last data filed at 04/27/2017 0700 Gross per 24 hour  Intake 1532 ml  Output 1600 ml  Net -68 ml    Physical Exam: Affect appropriate Healthy:  appears stated age HEENT: normal Neck supple with no adenopathy JVP normal no bruits no thyromegaly Lungs clear with no wheezing and good diaphragmatic motion Heart:  S1/S2 no murmur, no rub, gallop or click PMI normal Abdomen: benighn, BS positve, no tenderness, no AAA no bruit.  No HSM or HJR Distal pulses intact with no bruits No edema Neuro non-focal Skin warm and dry No muscular weakness   Lab Results: Basic Metabolic Panel: Recent Labs    04/25/17 0258 04/26/17 0406  NA 133* 134*  K 3.8 4.3  CL 98* 101  CO2 23 21*  GLUCOSE 283* 317*  BUN 14 15  CREATININE 0.97 0.96  CALCIUM 9.2 9.3   Liver Function Tests: No results for input(s): AST, ALT, ALKPHOS, BILITOT, PROT, ALBUMIN in the last 72 hours. No results for input(s): LIPASE, AMYLASE in the last 72 hours. CBC: Recent Labs    04/26/17 0406 04/27/17 0326  WBC 10.5 10.7*  HGB 13.4 12.9  HCT 42.1 40.2  MCV 87.3 87.8  PLT 239 238   Cardiac Enzymes: Recent Labs    04/24/17 1253 04/24/17 1610 04/25/17 0058  TROPONINI 0.83* 1.83* >65.00*   BNP: Invalid input(s): POCBNP D-Dimer: Recent Labs    04/24/17 0901  DDIMER <0.27   Hemoglobin A1C: No results for input(s): HGBA1C in the last 72 hours. Fasting Lipid Panel: Recent Labs    04/25/17 0258  CHOL 230*    HDL 30*  LDLCALC UNABLE TO CALCULATE IF TRIGLYCERIDE OVER 400 mg/dL  TRIG 518*  CHOLHDL 7.7    Imaging: No results found.  Cardiac Studies:  Echo :  : EF 55-50% moderate LVH grade 2 diastolic    Telemetry:  NSR no VT 04/27/2017   ECG:  SR LAD LVH no acute ST elevation   Medications:   . amLODipine  10 mg Oral Daily  . aspirin EC  81 mg Oral Daily  . carvedilol  12.5 mg Oral BID WC  . insulin aspart  0-15 Units Subcutaneous TID WC  . insulin aspart  0-5 Units Subcutaneous QHS  . insulin glargine  25 Units Subcutaneous Q2200  . lisinopril  40 mg Oral Daily  . rosuvastatin  40 mg Oral Daily  . sodium chloride flush  3 mL Intravenous Q12H  . sodium chloride flush  3 mL Intravenous Q12H     . sodium chloride    . sodium chloride    . sodium chloride 1 mL/kg/hr (04/27/17 0704)  . heparin 1,500 Units/hr (04/26/17 2113)    Assessment/Plan:  NSTEMI:  Surprising that troponin >65 on 04/25/17  EF normal by echo no acute ECG changes Suggests circumflex lesion. Continue beta blocker heparin ASA  for cath today Dr Christ Kick around [facility] Orders written cath lab called to confirm.   DM:  Cover with SS insulin   Chol:  On statin  Lab Results  Component Value Date   LDLCALC UNABLE TO CALCULATE IF TRIGLYCERIDE OVER 400 mg/dL 04/25/2017   HTN:  Controlled on norvasc and ACE may d/c former if BP tool low post MI  She has a wrap on right wrist which will need to be removed for cath Keeping IV in place  Time spent examining / interviewing patient and reviewing chart cath lab called and orders 45 minutes   Jenkins Rouge 04/27/2017, 8:12 AM

## 2017-04-27 NOTE — Progress Notes (Signed)
ANTICOAGULATION CONSULT NOTE  Pharmacy Consult:  Heparin Indication: chest pain/ACS  Allergies  Allergen Reactions  . Lipitor [Atorvastatin] Other (See Comments)    Cramping in feet that resolved after stopping med  . Diclofenac Nausea Only and Other (See Comments)    Caused a lot of stomach pain     Patient Measurements: Height: 5\' 5"  (165.1 cm) Weight: 183 lb 12.8 oz (83.4 kg) IBW/kg (Calculated) : 57 Heparin Dosing Weight: 75 kg   Vital Signs: Temp: 98.5 F (36.9 C) (04/08 0332) Temp Source: Oral (04/08 0332) BP: 135/83 (04/08 0332) Pulse Rate: 77 (04/08 0332)  Labs: Recent Labs    04/24/17 1253 04/24/17 1610  04/25/17 0058  04/25/17 0258  04/25/17 2122 04/26/17 0406 04/27/17 0326  HGB  --   --   --   --    < > 13.8  --   --  13.4 12.9  HCT  --   --   --   --   --  43.0  --   --  42.1 40.2  PLT  --   --   --   --   --  263  --   --  239 238  LABPROT  --   --   --   --   --  13.3  --   --   --   --   INR  --   --   --   --   --  1.02  --   --   --   --   HEPARINUNFRC  --   --    < >  --   --   --    < > 0.31 0.37 0.30  CREATININE  --   --   --   --   --  0.97  --   --  0.96  --   TROPONINI 0.83* 1.83*  --  >65.00*  --   --   --   --   --   --    < > = values in this interval not displayed.    Assessment: 85 YOF with chest pain and elevated troponin to continue on IV heparin for ACS. Heparin level is therapeutic and at the low end of normal.  No bleeding reported.   Goal of Therapy:  Heparin level 0.3-0.7 units/ml Monitor platelets by anticoagulation protocol: Yes    Plan:  Increase heparin gtt slightly to 1550 units/hr Daily heparin level and CBC F/U post cath and increase Lantus   Anitta Tenny D. Mina Marble, PharmD, BCPS Pager:  [phone] 04/27/2017, 8:35 AM

## 2017-04-27 NOTE — H&P (View-Only) (Signed)
Cardiologist:  [place]   Subjective:  Denies SSCP, palpitations or Dyspnea   Objective:  Vitals:   04/26/17 1538 04/26/17 1728 04/26/17 1924 04/27/17 0332  BP: 130/73 (!) 123/91 124/73 135/83  Pulse: 78 85 73 77  Resp:   16 (!) 24  Temp: 98.2 F (36.8 C)  98.3 F (36.8 C) 98.5 F (36.9 C)  TempSrc: Oral  Oral Oral  SpO2: (!) 89%  91% 94%  Weight:    183 lb 12.8 oz (83.4 kg)  Height:        Intake/Output from previous day:  Intake/Output Summary (Last 24 hours) at 04/27/2017 2440 Last data filed at 04/27/2017 0700 Gross per 24 hour  Intake 1532 ml  Output 1600 ml  Net -68 ml    Physical Exam: Affect appropriate Healthy:  appears stated age HEENT: normal Neck supple with no adenopathy JVP normal no bruits no thyromegaly Lungs clear with no wheezing and good diaphragmatic motion Heart:  S1/S2 no murmur, no rub, gallop or click PMI normal Abdomen: benighn, BS positve, no tenderness, no AAA no bruit.  No HSM or HJR Distal pulses intact with no bruits No edema Neuro non-focal Skin warm and dry No muscular weakness   Lab Results: Basic Metabolic Panel: Recent Labs    04/25/17 0258 04/26/17 0406  NA 133* 134*  K 3.8 4.3  CL 98* 101  CO2 23 21*  GLUCOSE 283* 317*  BUN 14 15  CREATININE 0.97 0.96  CALCIUM 9.2 9.3   Liver Function Tests: No results for input(s): AST, ALT, ALKPHOS, BILITOT, PROT, ALBUMIN in the last 72 hours. No results for input(s): LIPASE, AMYLASE in the last 72 hours. CBC: Recent Labs    04/26/17 0406 04/27/17 0326  WBC 10.5 10.7*  HGB 13.4 12.9  HCT 42.1 40.2  MCV 87.3 87.8  PLT 239 238   Cardiac Enzymes: Recent Labs    04/24/17 1253 04/24/17 1610 04/25/17 0058  TROPONINI 0.83* 1.83* >65.00*   BNP: Invalid input(s): POCBNP D-Dimer: Recent Labs    04/24/17 0901  DDIMER <0.27   Hemoglobin A1C: No results for input(s): HGBA1C in the last 72 hours. Fasting Lipid Panel: Recent Labs    04/25/17 0258  CHOL 230*    HDL 30*  LDLCALC UNABLE TO CALCULATE IF TRIGLYCERIDE OVER 400 mg/dL  TRIG 518*  CHOLHDL 7.7    Imaging: No results found.  Cardiac Studies:  Echo :  : EF 55-50% moderate LVH grade 2 diastolic    Telemetry:  NSR no VT 04/27/2017   ECG:  SR LAD LVH no acute ST elevation   Medications:   . amLODipine  10 mg Oral Daily  . aspirin EC  81 mg Oral Daily  . carvedilol  12.5 mg Oral BID WC  . insulin aspart  0-15 Units Subcutaneous TID WC  . insulin aspart  0-5 Units Subcutaneous QHS  . insulin glargine  25 Units Subcutaneous Q2200  . lisinopril  40 mg Oral Daily  . rosuvastatin  40 mg Oral Daily  . sodium chloride flush  3 mL Intravenous Q12H  . sodium chloride flush  3 mL Intravenous Q12H     . sodium chloride    . sodium chloride    . sodium chloride 1 mL/kg/hr (04/27/17 0704)  . heparin 1,500 Units/hr (04/26/17 2113)    Assessment/Plan:  NSTEMI:  Surprising that troponin >65 on 04/25/17  EF normal by echo no acute ECG changes Suggests circumflex lesion. Continue beta blocker heparin ASA  for cath today Dr Christ Kick around [facility] Orders written cath lab called to confirm.   DM:  Cover with SS insulin   Chol:  On statin  Lab Results  Component Value Date   LDLCALC UNABLE TO CALCULATE IF TRIGLYCERIDE OVER 400 mg/dL 04/25/2017   HTN:  Controlled on norvasc and ACE may d/c former if BP tool low post MI  She has a wrap on right wrist which will need to be removed for cath Keeping IV in place  Time spent examining / interviewing patient and reviewing chart cath lab called and orders 45 minutes   Jenkins Rouge 04/27/2017, 8:12 AM

## 2017-04-28 ENCOUNTER — Inpatient Hospital Stay (HOSPITAL_COMMUNITY): Payer: Medicaid Other

## 2017-04-28 ENCOUNTER — Encounter (HOSPITAL_COMMUNITY): Payer: Self-pay | Admitting: Interventional Cardiology

## 2017-04-28 ENCOUNTER — Telehealth: Payer: Self-pay | Admitting: Adult Health

## 2017-04-28 DIAGNOSIS — Z794 Long term (current) use of insulin: Secondary | ICD-10-CM

## 2017-04-28 DIAGNOSIS — F05 Delirium due to known physiological condition: Secondary | ICD-10-CM

## 2017-04-28 DIAGNOSIS — E889 Metabolic disorder, unspecified: Secondary | ICD-10-CM

## 2017-04-28 DIAGNOSIS — E0865 Diabetes mellitus due to underlying condition with hyperglycemia: Secondary | ICD-10-CM

## 2017-04-28 LAB — URINALYSIS, ROUTINE W REFLEX MICROSCOPIC
Bacteria, UA: NONE SEEN
Bilirubin Urine: NEGATIVE
HGB URINE DIPSTICK: NEGATIVE
KETONES UR: 20 mg/dL — AB
LEUKOCYTES UA: NEGATIVE
Nitrite: NEGATIVE
PROTEIN: NEGATIVE mg/dL
RBC / HPF: NONE SEEN RBC/hpf (ref 0–5)
Specific Gravity, Urine: 1.017 (ref 1.005–1.030)
pH: 6 (ref 5.0–8.0)

## 2017-04-28 LAB — GLUCOSE, CAPILLARY
GLUCOSE-CAPILLARY: 283 mg/dL — AB (ref 65–99)
GLUCOSE-CAPILLARY: 321 mg/dL — AB (ref 65–99)
GLUCOSE-CAPILLARY: 294 mg/dL — AB (ref 65–99)
Glucose-Capillary: 245 mg/dL — ABNORMAL HIGH (ref 65–99)

## 2017-04-28 LAB — CBC
HCT: 42.7 % (ref 36.0–46.0)
Hemoglobin: 13.9 g/dL (ref 12.0–15.0)
MCH: 28.5 pg (ref 26.0–34.0)
MCHC: 32.6 g/dL (ref 30.0–36.0)
MCV: 87.5 fL (ref 78.0–100.0)
PLATELETS: 266 10*3/uL (ref 150–400)
RBC: 4.88 MIL/uL (ref 3.87–5.11)
RDW: 14.2 % (ref 11.5–15.5)
WBC: 13.3 10*3/uL — ABNORMAL HIGH (ref 4.0–10.5)

## 2017-04-28 LAB — BASIC METABOLIC PANEL
ANION GAP: 10 (ref 5–15)
Anion gap: 11 (ref 5–15)
BUN: 14 mg/dL (ref 6–20)
BUN: 16 mg/dL (ref 6–20)
CALCIUM: 9.4 mg/dL (ref 8.9–10.3)
CO2: 22 mmol/L (ref 22–32)
CO2: 24 mmol/L (ref 22–32)
CREATININE: 0.94 mg/dL (ref 0.44–1.00)
Calcium: 9.3 mg/dL (ref 8.9–10.3)
Chloride: 102 mmol/L (ref 101–111)
Chloride: 100 mmol/L — ABNORMAL LOW (ref 101–111)
Creatinine, Ser: 0.98 mg/dL (ref 0.44–1.00)
GFR calc Af Amer: >60 mL/min (ref >60)
GLUCOSE: 272 mg/dL — AB (ref 65–99)
Glucose, Bld: 280 mg/dL — ABNORMAL HIGH (ref 65–99)
POTASSIUM: 4.1 mmol/L (ref 3.5–5.1)
Potassium: 4.5 mmol/L (ref 3.5–5.1)
SODIUM: 135 mmol/L (ref 135–145)
SODIUM: 134 mmol/L — AB (ref 135–145)

## 2017-04-28 LAB — BLOOD GAS, ARTERIAL
Acid-Base Excess: 2.2 mmol/L — ABNORMAL HIGH (ref 0.0–2.0)
Bicarbonate: 25.7 mmol/L (ref 20.0–28.0)
DRAWN BY: 270221
O2 SAT: 89.2 %
PCO2 ART: 34.9 mmHg (ref 32.0–48.0)
PH ART: 7.477 — AB (ref 7.350–7.450)
Patient temperature: 97.4
pO2, Arterial: 56.2 mmHg — ABNORMAL LOW (ref 83.0–108.0)

## 2017-04-28 LAB — TSH: TSH: 0.422 u[IU]/mL (ref 0.350–4.500)

## 2017-04-28 LAB — AMMONIA: AMMONIA: 23 umol/L (ref 9–35)

## 2017-04-28 MED ORDER — NALOXONE HCL 0.4 MG/ML IJ SOLN
INTRAMUSCULAR | Status: AC
  Administered 2017-04-28: 12:00:00 0.4 mg
  Filled 2017-04-28: qty 1

## 2017-04-28 MED ORDER — STROKE: EARLY STAGES OF RECOVERY BOOK
Freq: Once | Status: AC
  Administered 2017-04-29
  Filled 2017-04-28: qty 1

## 2017-04-28 MED ORDER — TICAGRELOR 90 MG PO TABS: 90.0000 mg | ORAL_TABLET | Freq: Two times a day (BID) | ORAL | 3 refills | Status: DC

## 2017-04-28 MED ORDER — METFORMIN HCL 1000 MG PO TABS: ORAL_TABLET | ORAL | 2 refills | Status: DC

## 2017-04-28 MED ORDER — TICAGRELOR 90 MG PO TABS: 90.0000 mg | ORAL_TABLET | Freq: Two times a day (BID) | ORAL | 0 refills | Status: DC

## 2017-04-28 MED ORDER — ROSUVASTATIN CALCIUM 40 MG PO TABS: 40.0000 mg | ORAL_TABLET | Freq: Every day | ORAL | 3 refills | Status: DC

## 2017-04-28 MED FILL — Tirofiban HCl in NaCl 0.9% IV Soln 5 MG/100ML (Base Equiv): INTRAVENOUS | Qty: 100 | Status: AC

## 2017-04-28 MED FILL — Heparin Sodium (Porcine) 2 Unit/ML in Sodium Chloride 0.9%: INTRAMUSCULAR | Qty: 1000 | Status: AC

## 2017-04-28 MED FILL — Lidocaine HCl Local Inj 1%: INTRAMUSCULAR | Qty: 20 | Status: AC

## 2017-04-28 NOTE — Progress Notes (Signed)
Inpatient Diabetes Program Recommendations  AACE/ADA: New Consensus Statement on Inpatient Glycemic Control (2015)  Target Ranges:  Prepandial:   less than 140 mg/dL      Peak postprandial:   less than 180 mg/dL (1-2 hours)      Critically ill patients:  140 - 180 mg/dL   Lab Results  Component Value Date   GLUCAP 283 (H) 04/28/2017   HGBA1C 13.7 07/31/2016    Review of Glycemic Control Results for TIONA, RUANE (MRN [id number]) as of 04/28/2017 12:53  Ref. Range 04/27/2017 16:57 04/27/2017 21:04 04/28/2017 06:47 04/28/2017 12:37  Glucose-Capillary Latest Ref Range: 65 - 99 mg/dL 248 (H) 285 (H) 245 (H) 283 (H)   Diabetes history: Type 2 DM Outpatient Diabetes medications: Lantus 50 units QAM, Novolog 20 units TID, Trulicity .75 mg Q wk Current orders for Inpatient glycemic control: Lantus 25 units QHS, Novolog 0-5 units QHS, Novolog 0-15 units TID  Inpatient Diabetes Program Recommendations:    Last A1C was from July, 2018. Consider repeating A1C to see updated level of control.   Noted patient's Lantus dose was held last night. Would recommend increasing Lantus to 35 units (patient is on Lantus 50 units at home).   Thanks, Bronson Curb, MSN, RNC-OB Diabetes Coordinator [phone] (8a-5p)

## 2017-04-28 NOTE — Progress Notes (Signed)
Cardiologist:  [place]   Subjective:  Denies SSCP, palpitations or Dyspnea Post PCI/Stent ostial circumflex   Objective:  Vitals:   04/27/17 2000 04/27/17 2001 04/28/17 0221 04/28/17 0705  BP: (!) 155/78 (!) 155/78 (!) 162/90 (!) 158/92  Pulse: 78 72 78 81  Resp: (!) 25 (!) 23 (!) 23 15  Temp:  (!) 97.4 F (36.3 C) (!) 97.5 F (36.4 C)   TempSrc:  Axillary Oral   SpO2: 93% 92% 93% 100%  Weight:   182 lb 15.7 oz (83 kg)   Height:        Intake/Output from previous day:  Intake/Output Summary (Last 24 hours) at 04/28/2017 0750 Last data filed at 04/28/2017 0647 Gross per 24 hour  Intake 465 ml  Output 600 ml  Net -135 ml    Physical Exam: Affect appropriate Healthy:  appears stated age HEENT: normal Neck supple with no adenopathy JVP normal no bruits no thyromegaly Lungs clear with no wheezing and good diaphragmatic motion Heart:  S1/S2 no murmur, no rub, gallop or click PMI normal Abdomen: benighn, BS positve, no tenderness, no AAA no bruit.  No HSM or HJR Distal pulses intact with no bruits No edema Neuro non-focal Skin warm and dry No muscular weakness Right radial minor bruising    Lab Results: Basic Metabolic Panel: Recent Labs    04/26/17 0406 04/28/17 0246  NA 134* 135  K 4.3 4.5  CL 101 102  CO2 21* 22  GLUCOSE 317* 272*  BUN 15 14  CREATININE 0.96 0.94  CALCIUM 9.3 9.4   Liver Function Tests: No results for input(s): AST, ALT, ALKPHOS, BILITOT, PROT, ALBUMIN in the last 72 hours. No results for input(s): LIPASE, AMYLASE in the last 72 hours. CBC: Recent Labs    04/27/17 0326 04/28/17 0246  WBC 10.7* 13.3*  HGB 12.9 13.9  HCT 40.2 42.7  MCV 87.8 87.5  PLT 238 266   Cardiac Enzymes: No results for input(s): CKTOTAL, CKMB, CKMBINDEX, TROPONINI in the last 72 hours. BNP: Invalid input(s): POCBNP D-Dimer: No results for input(s): DDIMER in the last 72 hours. Hemoglobin A1C: No results for input(s): HGBA1C in the last 72  hours. Fasting Lipid Panel: No results for input(s): CHOL, HDL, LDLCALC, TRIG, CHOLHDL, LDLDIRECT in the last 72 hours.  Imaging: No results found.  Cardiac Studies:  Echo :  : EF 55-50% moderate LVH grade 2 diastolic    Telemetry:  NSR no VT 04/28/2017   ECG:  SR LAD LVH no acute ST elevation   Medications:   . amLODipine  10 mg Oral Daily  . aspirin EC  81 mg Oral Daily  . carvedilol  12.5 mg Oral BID WC  . insulin aspart  0-15 Units Subcutaneous TID WC  . insulin aspart  0-5 Units Subcutaneous QHS  . insulin glargine  25 Units Subcutaneous Q2200  . lisinopril  40 mg Oral Daily  . rosuvastatin  40 mg Oral Daily  . ticagrelor  90 mg Oral BID     . sodium chloride      Assessment/Plan:  NSTEMI:  Post DES to ostial circumflex no angina this am Continue DAT D/c home this am   DM:  Cover with SS insulin   Chol:  On statin  Lab Results  Component Value Date   LDLCALC UNABLE TO CALCULATE IF TRIGLYCERIDE OVER 400 mg/dL 04/25/2017   HTN:  Controlled on norvasc and ACE may d/c former if BP tool low post MI  Jenkins Rouge 04/28/2017, 7:50 AM

## 2017-04-28 NOTE — Progress Notes (Addendum)
NURSE CALLED TO INFORM CT Head   RESULTS, WHICH  WERE  Ordered  By IM staff,  I alerted  Them to page Im as well  And  Stat Neurology as well

## 2017-04-28 NOTE — Progress Notes (Signed)
Pt.had no dinner bec.she sleeps most of the time & refused to eat bec.she just vomited & she is supposed to get Lantus 25 units,MD on call Gatti was called & made aware & ordered to hold the Lantus tonite.

## 2017-04-28 NOTE — Progress Notes (Signed)
Called neuro consult for + stroke on CT head performed due to lethargy per chart notes. CT was not called to this NP. RN paged because family wanted the results. As soon as NP realized CT positive, neuro was called and will see the pt now.  KJKG, NP [facility]

## 2017-04-28 NOTE — Telephone Encounter (Signed)
Current admit 

## 2017-04-28 NOTE — Evaluation (Signed)
Physical Therapy Evaluation Patient Details Name: MERINDA VICTORINO MRN: [id number] DOB: [birth year] Today's Date: 04/28/2017   History of Present Illness  Pt is a 62 y.o. female admitted 04/24/17 with chest pain; worked up for NSTEMI. S/p L heart cath 4/8 and DES to ostial circumflex. PMH includes HTN, DM, sleep apnea, bilat knee arthritis.    Clinical Impression  Pt presents with an overall decrease in functional mobility secondary to above. Pt very lethargic, requiring repeated, max multimodal cues to open eyes and participate this session. Oriented and appropriately interactive despite this lethargy; also asking for assist with all aspects of mobility (including feeding self), requiring max encouragement to perform tasks independently. PTA, pt lives with adolescent grandkids; no family available for 24/7 support. Pt required close min guard for transfers and short ambulation with RW; 1x LOB requiring minA to correct. Decreased safety awareness and slowed processing. Do not feel pt is safe to return home today; is at significant risk for falls, can barely keep eyes open, and does not have consistent family support. If pt to d/c today, discussed rec for short-term SNF-level therapies; pt in agreement with this. Will follow acutely.     Follow Up Recommendations SNF;Supervision/Assistance - 24 hour    Equipment Recommendations  Other (comment)(rollator)    Recommendations for Other Services       Precautions / Restrictions Precautions Precautions: Fall Restrictions Weight Bearing Restrictions: No      Mobility  Bed Mobility Overal bed mobility: Needs Assistance Bed Mobility: Supine to Sit     Supine to sit: Min guard;HOB elevated     General bed mobility comments: Significant increased time and effort, requiring cues to stay awake and step by step sequencing. Falling asleep during transfer  Transfers Overall transfer level: Needs assistance Equipment used: Rolling walker (2  wheeled) Transfers: Sit to/from Stand Sit to Stand: Min guard         General transfer comment: Stood x2 from bed with RW and min guard for balance. Poor eccentric control into sitting despite cues for safety; very poor safety awareness (due to lethargy?)  Ambulation/Gait Ambulation/Gait assistance: Min guard;Min assist Ambulation Distance (Feet): 5 Feet Assistive device: Rolling walker (2 wheeled)   Gait velocity: Decreased Gait velocity interpretation: <1.8 ft/sec, indicative of risk for recurrent falls General Gait Details: Initially marching in place with RW and min guard for balance; pt stating "I have to sit, I'm going to pass out" with minA to control descent; VSS. Close min guard taking steps to chair with RW; very unsteady gait  Stairs            Wheelchair Mobility    Modified Rankin (Stroke Patients Only)       Balance Overall balance assessment: Needs assistance Sitting-balance support: Bilateral upper extremity supported;Feet supported Sitting balance-Leahy Scale: Fair       Standing balance-Leahy Scale: Poor Standing balance comment: Reliant on UE support                             Pertinent Vitals/Pain Pain Assessment: No/denies pain    Home Living Family/patient expects to be discharged to:: Private residence Living Arrangements: Other relatives(Grandkids (17 & younger)) Available Help at Discharge: Family;Available PRN/intermittently Type of Home: House Home Access: Ramped entrance     Home Layout: One level Home Equipment: Walker - 2 wheels      Prior Function Level of Independence: Independent  Hand Dominance        Extremity/Trunk Assessment   Upper Extremity Assessment Upper Extremity Assessment: Overall WFL for tasks assessed    Lower Extremity Assessment Lower Extremity Assessment: Generalized weakness       Communication   Communication: Expressive difficulties(soft spoken)  Cognition  Arousal/Alertness: Lethargic;Suspect due to medications Behavior During Therapy: Flat affect Overall Cognitive Status: Impaired/Different from baseline Area of Impairment: Attention;Following commands;Safety/judgement;Awareness;Problem solving                   Current Attention Level: Focused   Following Commands: Follows one step commands inconsistently;Follows one step commands with increased time Safety/Judgement: Decreased awareness of safety;Decreased awareness of deficits Awareness: Emergent Problem Solving: Decreased initiation;Slow processing;Difficulty sequencing;Requires verbal cues General Comments: Pt with significant lethargy, requiring max multimodal cues to participate. Had eyes closed majority of session, even while standing and attempting to ambulate. Requesting assist throughout session, even to feed self, requiring max encouragement to perform tasks independently. Decreased safety awareness       General Comments General comments (skin integrity, edema, etc.): Grandkids present throughout session    Exercises     Assessment/Plan    PT Assessment Patient needs continued PT services  PT Problem List Decreased strength;Decreased activity tolerance;Decreased balance;Decreased mobility;Decreased cognition;Decreased knowledge of use of DME;Decreased safety awareness       PT Treatment Interventions DME instruction;Gait training;Stair training;Functional mobility training;Therapeutic activities;Therapeutic exercise;Balance training;Patient/family education    PT Goals (Current goals can be found in the Care Plan section)  Acute Rehab PT Goals Patient Stated Goal: Get stronger at SNF PT Goal Formulation: With patient Time For Goal Achievement: 05/12/17 Potential to Achieve Goals: Good    Frequency Min 2X/week   Barriers to discharge Decreased caregiver support      Co-evaluation               AM-PAC PT "6 Clicks" Daily Activity  Outcome Measure  Difficulty turning over in bed (including adjusting bedclothes, sheets and blankets)?: A Little Difficulty moving from lying on back to sitting on the side of the bed? : A Little Difficulty sitting down on and standing up from a chair with arms (e.g., wheelchair, bedside commode, etc,.)?: Unable Help needed moving to and from a bed to chair (including a wheelchair)?: A Little Help needed walking in hospital room?: A Lot Help needed climbing 3-5 steps with a railing? : A Lot 6 Click Score: 14    End of Session Equipment Utilized During Treatment: Gait belt Activity Tolerance: Patient limited by lethargy Patient left: in chair;with call bell/phone within reach;with family/visitor present Nurse Communication: Mobility status PT Visit Diagnosis: Other abnormalities of gait and mobility (R26.89)    Time: 0932-6712 PT Time Calculation (min) (ACUTE ONLY): 30 min   Charges:   PT Evaluation $PT Eval Moderate Complexity: 1 Mod PT Treatments $Therapeutic Activity: 8-22 mins   PT G Codes:       Mabeline Caras, PT, DPT Acute Rehab Services  Pager: [place] 04/28/2017, 9:33 AM

## 2017-04-28 NOTE — Progress Notes (Signed)
[id number] Came to see pt to educate. Family arrived and I reinforced ed with daughter and granddaughter. Stressed importance of brilinta with stent. Has brilinta card. Reviewed NTG use, MI restrictions and CRP 2. Referring to [facility] program for after completion of Home PT. Discussed carb counting with granddaughter and gave diabetic and heart healthy handouts. Pt vomited 400 cc of liquid when I was in room. Notified RN. Put pt's denture in cup with efferdent with family and pt approval. Will follow up tomorrow. Graylon Good RN BSN 04/28/2017 3:27 PM

## 2017-04-28 NOTE — Care Management Note (Signed)
Case Management Note  Patient Details  Name: Holly Acosta MRN: [id number] Date of Birth: 29-May-1955  Subjective/Objective:                 Spoke w granddaughter at the bedside and patient's daughter on the phone. They state patient will have 24/7 support at home, provided by various family members. Patient has a handicapped bathroom, but will need a rollator. Order placed and it will be delivered to the room prior to discharge. Daughter states they are comfortable with medication administration and deferred [place], [place] to provide [facility] PT OT at [place], referral accepted by Butch Penny. No other CM needs.    Action/Plan:   Expected Discharge Date:  04/28/17               Expected Discharge Plan:  [place]  In-House Referral:  NA  Discharge planning Services  CM Consult, Medication Assistance  Post Acute Care Choice:  Durable Medical Equipment, Home Health Choice offered to:  Adult Children  DME Arranged:  Walker rolling with seat DME Agency:  [place] Arranged:  OT, PT [place] Agency:  [place]  Status of Service:  Completed, signed off  If discussed at [place] of Stay Meetings, dates discussed:    Additional Comments:  Carles Collet, RN 04/28/2017, 12:29 PM

## 2017-04-28 NOTE — Discharge Summary (Addendum)
Discharge Summary    Patient ID: Holly Acosta,  MRN: [id number], DOB/AGE: [birth year] 62 y.o.  Admit date: 04/24/2017 Discharge date: 05/04/2017  Primary Care Provider: Sela Hua Primary Cardiologist: Peter Martinique, MD  Discharge Diagnoses    Principal Problem:   Non-ST elevation (NSTEMI) myocardial infarction [facility]) Active Problems:   DM (diabetes mellitus), type 2, uncontrolled ([place])   Hyperlipidemia associated with type 2 diabetes mellitus ([place])   HYPERTENSION, BENIGN ESSENTIAL   Acute cerebrovascular accident (CVA) of cerebellum ([place])   Status post coronary artery stent placement   Coronary artery disease involving native coronary artery of native heart with unstable angina pectoris (HCC)   Allergies Allergies  Allergen Reactions  . Lipitor [Atorvastatin] Other (See Comments)    Cramping in feet that resolved after stopping med  . Diclofenac Nausea Only and Other (See Comments)    Caused a lot of stomach pain     Diagnostic Studies/Procedures    Cardiac Catheterization: 04/27/2017  Prox RCA lesion is 25% stenosed.  Mid RCA to Dist RCA lesion is 25% stenosed.  Prox LAD lesion is 25% stenosed.  The left ventricular systolic function is normal.  LV end diastolic pressure is mildly elevated. LVEDP 18 mm Hg.  The left ventricular ejection fraction is 55-65% by visual estimate.  There is no aortic valve stenosis.  Prox Cx lesion is 99% stenosed. This was the culprit lesion.  A drug-eluting stent was successfully placed using a STENT SIERRA 3.50 X 18 MM.  Post intervention, there is a 0% residual stenosis.   Continue dual antiplatelet therapy for at least one year with aggressive secondary prevention.   I stressed the importance of compliance with her Brilinta.    Echocardiogram:  04/25/2017 Study Conclusions - Left ventricle: The cavity size was normal. Wall thickness was   increased in a pattern of moderate LVH. There was focal basal   hypertrophy.  Systolic function was normal. The estimated ejection   fraction was in the range of 55% to 60%. Wall motion was normal;   there were no regional wall motion abnormalities. Features are   consistent with a pseudonormal left ventricular filling pattern,   with concomitant abnormal relaxation and increased filling   pressure (grade 2 diastolic dysfunction).  Echocardiogram: 04/29/2017 Study Conclusions - Left ventricle: The cavity size was normal. There was moderate   concentric hypertrophy. Systolic function was normal. The   estimated ejection fraction was in the range of 55% to 60%.   Doppler parameters are consistent with abnormal left ventricular   relaxation (grade 1 diastolic dysfunction). There was no evidence   of elevated ventricular filling pressure by Doppler parameters. - Mitral valve: There was no regurgitation. Valve area by pressure   half-time: 2.08 cm^2. - Left atrium: The atrium was normal in size. - Right ventricle: Systolic function was normal. - Right atrium: The atrium was normal in size. - Tricuspid valve: There was no regurgitation. - Pericardium, extracardiac: The pericardium was normal in   appearance.  Impressions: - There is hypokinesis of the basal inferior, inferolateral and mid   inferolateral walls, overall LVEF is preserved at 55-60%. am: 04/29/2017    History of Present Illness     Holly Acosta is a 62 y.o. female with past medical history of HTN, HLD, DM Type 2, OSA, and arthritis who presented to [facility] ED on 04/24/2017 for evaluation of chest pain.   Reported being in her usual state of health until the evening  PTA when she developed an achy substernal chest pain radiating to both arms while watching TV. She reports the initial episode lasted 5-10 minutes and resolved spontaneously. She continued to have waxing and waning chest pain prompting her to present to the ED.   She had a NST in 2008 with reversible decreased myocardial perfusion and  further underwent cardiac catheterization with normal coronaries. She has not had an ischemic evaluation since that time. She reports being fairly sedentary with mobility limited by arthritis. Denies family history of CAD, MI, CHF. She is a former smoker, having quit 20+ years ago. She has a history of OSA and is non-compliant with her CPAP.   ED course: Hypertensive, otherwise VSS. Labs notable for Na 133, K 4, Cr 0.98, Hgb 13.7, PLT 245, Ddimer negative, BNP 31, POC trop 0.00> 0.08. EKG with NSR, LVH, biphasic TW in III, appears more inverted on repeat; otherwise no STE/D. CXR with atelectasis. Cardiology asked to see for evaluation of CP.    Hospital Course     Consultants: Internal Medicine, Neurology  1. NSTEMI - Troponin values peaked at > 65.00 during admission. Cardiac catheterization on 04/27/2017 showed severe stenosis along LCx with DES placement performed at that time. Full report as outlined above.   - she will continue on ASA, Brilinta, BB, and statin therapy.  - Cardiology follow-up has been arranged.   2. Lethargy, altered mental status, leukocytosis --> found to have acute CVA - following the procedure, she developed worsening lethargy and leukocytosis. IM initially consulted  but Neurology later consulted and found to have a CVA. Head CT withevidence of acute and subacute left PICA infarct. 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 - The Stroke Team followed throughout admission and their recommendations were appreciated. Evaluated by PT with SNF recommended. Social work assisted with placement. Outpatient Neurology follow-up arranged.   3. HTN - BP remained stable during admission and  permissive HTN was allowed in the setting of her CVA. - She was continued on Amlodipine, Coreg, and Lisinopril with Amlodipine and Lisinopril dosing being reduced  following CVA. Anticipate titration of Amlodipine back to 9m daily at outpatient follow-up.   4. Uncontrolled IDDM - A1c elevated to 13.3 on 04/29/17. Diabetes Coordinator followed during admission.  - has close follow-up with the Family Medicine Service on 05/06/2017.   5. HLD - FLP this admission shows total cholesterol of 173, HDL 34, LDL 107, and Triglycerides 159. - has been started on Crestor 457mdaily. Will need repeat FLP and LFT's in 6 weeks.   6. OSA Non compliant with CPAP - Consider outpatient re-evaluation.   Discharge Vitals Blood pressure (!) 142/85, pulse 74, temperature 98.1 F (36.7 C), temperature source Oral, resp. rate 18, height _0  (1.651 m), weight 179 lb 10.8 oz (81.5 kg), SpO2 94 %.  Filed Weights   05/01/17 0500 05/02/17 0241 05/04/17 0349  Weight: 187 lb 6.3 oz (85 kg) 175 lb 11.3 oz (79.7 kg) 179 lb 10.8 oz (81.5 kg)   Physical Exam  Constitutional: She is oriented to person, place, and time and well-developed, well-nourished, and in no distress.  HENT:  Head: Normocephalic and atraumatic.  Eyes: Pupils are equal, round, and reactive to light. Conjunctivae and EOM are normal.  Neck: Normal range of motion. Neck supple.  Cardiovascular: Normal rate and regular rhythm.  R radial cath site with ecchymosis but no hematoma  Pulmonary/Chest: Effort normal  and breath sounds normal.  Abdominal: Soft. Bowel sounds are normal.  Musculoskeletal: Normal range of motion.  Neurological: She is alert and oriented to person, place, and time. No cranial nerve deficit. Coordination normal.  Skin: Skin is dry.  Psychiatric: Affect normal.   Labs & Radiologic Studies   _____________  Ct Angio Head W Or Wo Contrast  Result Date: 04/29/2017 CLINICAL DATA:  Stroke follow-up EXAM: CT ANGIOGRAPHY HEAD AND NECK TECHNIQUE: Multidetector CT imaging of the head and neck was performed using the standard protocol during bolus administration of intravenous contrast.  Multiplanar CT image reconstructions and MIPs were obtained to evaluate the vascular anatomy. Carotid stenosis measurements (when applicable) are obtained utilizing NASCET criteria, using the distal internal carotid diameter as the denominator. CONTRAST:  90m ISOVUE-370 IOPAMIDOL (ISOVUE-370) INJECTION 76% COMPARISON:  Brain MRI from earlier today. FINDINGS: CTA NECK FINDINGS Aortic arch: Atheromatous wall calcification. Low-density along the arch and proximal descending segment is attributed to atelectasis and non-opacified vein. Two vessel branching pattern. Right carotid system: Tortuosity without stenosis, dissection, or beading. Mild calcified plaque the proximal ECA. Left carotid system: Common carotid tortuosity. Mild atheromatous plaque on the distal common carotid and at the bifurcation. No stenosis, beading, or dissection. Vertebral arteries: No proximal subclavian stenosis. There is subclavian atherosclerosis without ulceration. Mild right vertebral artery dominance. Both vertebral arteries are smooth and widely patent to the dura. Skeleton: Cervical facet spurring.  No acute or aggressive finding. Other neck: Small thyroid nodules, none measuring over 1 cm, below size threshold for strict imaging follow-up per consensus guidelines. Upper chest: Negative Review of the MIP images confirms the above findings CTA HEAD FINDINGS Anterior circulation: Calcified plaque on the carotid siphons without flow limiting stenosis. No branch occlusion, beading, or aneurysm. Posterior circulation: The mid left PICA is occluded, correlating with the acute infarct. The vertebral and basilar arteries are smooth and diffusely patent. Good flow in bilateral posterior cerebral arteries. Hypoplastic right P1 segment. Venous sinuses: Patent on the delayed phase. Anatomic variants: None significant Delayed phase: No abnormal intracranial enhancement. Review of the MIP images confirms the above findings IMPRESSION: 1. Mid left  PICA occlusion correlating with known acute infarct. No underlying embolic source or flow limiting stenosis identified. 2. Overall mild intracranial and cervical atherosclerosis. Electronically Signed   By: JMonte FantasiaM.D.   On: 04/29/2017 11:35   Dg Chest 2 View  Result Date: 04/24/2017 CLINICAL DATA:  Central chest pain for 1 day radiating to the arms. EXAM: CHEST - 2 VIEW COMPARISON:  03/05/2009 FINDINGS: Shallow inspiration with probable linear atelectasis in the lung bases. No airspace disease or consolidation. No blunting of costophrenic angles. No pneumothorax. Mediastinal contours appear intact. Heart size and pulmonary vascularity are normal. Degenerative changes in the spine. IMPRESSION: Shallow inspiration with mild linear atelectasis in the lung bases. Electronically Signed   By: WLucienne CapersM.D.   On: 04/24/2017 01:31   Ct Head Wo Contrast  Result Date: 04/28/2017 CLINICAL DATA:  Altered mental status EXAM: CT HEAD WITHOUT CONTRAST TECHNIQUE: Contiguous axial images were obtained from the base of the skull through the vertex without intravenous contrast. COMPARISON:  None. FINDINGS: Brain: There is hypoattenuation within the medial left cerebellar hemisphere consistent with acute to early subacute infarct. There is mass effect on the fourth ventricle. No hydrocephalus. No intracranial hemorrhage or extra-axial collection. Bilateral basal ganglia mineralization. Otherwise normal appearance of the brain for age. Vascular: Atherosclerotic calcification of the internal carotid arteries at the skull base. No  hyperdense vessel. Skull: Normal visualized skull base, calvarium and extracranial soft tissues. Sinuses/Orbits: No sinus fluid levels or advanced mucosal thickening. No mastoid effusion. Normal orbits. IMPRESSION: Acute to early subacute left posterior inferior cerebellar artery territory infarct without hemorrhage. Mild mass effect on the fourth ventricle, but no hydrocephalus. These  results will be called to the ordering clinician or representative by the Radiologist Assistant, and communication documented in the PACS or zVision Dashboard. Electronically Signed   By: Ulyses Jarred M.D.   On: 04/28/2017 20:00   Ct Angio Neck W Or Wo Contrast  Result Date: 04/29/2017 CLINICAL DATA:  Stroke follow-up EXAM: CT ANGIOGRAPHY HEAD AND NECK TECHNIQUE: Multidetector CT imaging of the head and neck was performed using the standard protocol during bolus administration of intravenous contrast. Multiplanar CT image reconstructions and MIPs were obtained to evaluate the vascular anatomy. Carotid stenosis measurements (when applicable) are obtained utilizing NASCET criteria, using the distal internal carotid diameter as the denominator. CONTRAST:  62m ISOVUE-370 IOPAMIDOL (ISOVUE-370) INJECTION 76% COMPARISON:  Brain MRI from earlier today. FINDINGS: CTA NECK FINDINGS Aortic arch: Atheromatous wall calcification. Low-density along the arch and proximal descending segment is attributed to atelectasis and non-opacified vein. Two vessel branching pattern. Right carotid system: Tortuosity without stenosis, dissection, or beading. Mild calcified plaque the proximal ECA. Left carotid system: Common carotid tortuosity. Mild atheromatous plaque on the distal common carotid and at the bifurcation. No stenosis, beading, or dissection. Vertebral arteries: No proximal subclavian stenosis. There is subclavian atherosclerosis without ulceration. Mild right vertebral artery dominance. Both vertebral arteries are smooth and widely patent to the dura. Skeleton: Cervical facet spurring.  No acute or aggressive finding. Other neck: Small thyroid nodules, none measuring over 1 cm, below size threshold for strict imaging follow-up per consensus guidelines. Upper chest: Negative Review of the MIP images confirms the above findings CTA HEAD FINDINGS Anterior circulation: Calcified plaque on the carotid siphons without flow  limiting stenosis. No branch occlusion, beading, or aneurysm. Posterior circulation: The mid left PICA is occluded, correlating with the acute infarct. The vertebral and basilar arteries are smooth and diffusely patent. Good flow in bilateral posterior cerebral arteries. Hypoplastic right P1 segment. Venous sinuses: Patent on the delayed phase. Anatomic variants: None significant Delayed phase: No abnormal intracranial enhancement. Review of the MIP images confirms the above findings IMPRESSION: 1. Mid left PICA occlusion correlating with known acute infarct. No underlying embolic source or flow limiting stenosis identified. 2. Overall mild intracranial and cervical atherosclerosis. Electronically Signed   By: JMonte FantasiaM.D.   On: 04/29/2017 11:35   Mr Brain Wo Contrast  Result Date: 04/29/2017 CLINICAL DATA:  Initial evaluation for acute stroke. EXAM: MRI HEAD WITHOUT CONTRAST TECHNIQUE: Multiplanar, multiecho pulse sequences of the brain and surrounding structures were obtained without intravenous contrast. COMPARISON:  Prior CT from 04/28/2017. FINDINGS: Brain: Generalized age-related cerebral atrophy. Patchy and confluent T2/FLAIR hyperintensity within the periventricular, deep, and subcortical white matter both cerebral hemispheres, nonspecific, but most like related to chronic small vessel ischemic changes, moderate nature. There is an acute moderate size left PICA territory infarct involving the inferomedial left cerebellar hemisphere. Mild petechial hemorrhage without frank hemorrhagic transformation. Localized edema with mild mass effect on the fourth ventricular outflow tract which remains patent at this time. No hydrocephalus. Additional punctate 8 mm acute ischemic infarct within the inferior right cerebellar hemisphere, right PICA territory. No associated hemorrhage or mass effect. No other evidence for acute or subacute ischemia. Gray-white matter differentiation otherwise maintained. No  other areas of chronic infarction identified. No other evidence for acute or chronic intracranial hemorrhage. No mass lesion or midline shift. No hydrocephalus. No extra-axial fluid collection. Major dural sinuses are grossly patent. Incidental note made of a partially empty sella. Midline structures intact and normal. Vascular: Major intracranial vascular flow voids are maintained at the skull base. Right vertebral artery is dominant. Skull and upper cervical spine: Craniocervical junction within normal limits. Upper cervical spine unremarkable. Bone marrow signal intensity within normal limits. No scalp soft tissue abnormality. Sinuses/Orbits: Globes and orbital soft tissues within normal limits. Paranasal sinuses are clear. Trace opacity bilateral mastoid air cells, of doubtful significance. Inner ear structures normal. Other: None. IMPRESSION: 1. 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 Associated mild petechial hemorrhage without frank hemorrhagic transformation. Localized edema with mild mass effect on the fourth ventricular outflow tract which remains patent at this time. No hydrocephalus. 2. Additional 8 mm acute ischemic nonhemorrhagic right cerebellar infarct. 3. Moderate nonspecific cerebral white matter changes, most likely related to chronic small vessel ischemic disease. Electronically Signed   By: Jeannine Boga M.D.   On: 04/29/2017 06:03   Dg Chest Port 1v Same Day  Result Date: 04/28/2017 CLINICAL DATA:  Pneumonia. Shortness of breath. Dizziness on exertion. EXAM: PORTABLE CHEST 1 VIEW COMPARISON:  04/24/2017. FINDINGS: Mediastinum and hilar structures normal. Heart size stable. No pulmonary venous congestion. Low lung volumes with mild basilar atelectasis. No pleural effusion or pneumothorax. Degenerative change thoracic spine. Thoracic scoliosis. IMPRESSION: Low lung volumes with mild basilar atelectasis, otherwise negative chest. Chest is stable from  04/24/2017. Electronically Signed   By: Marcello Moores  Register   On: 04/28/2017 12:58   Disposition   Pt is being discharged to SNF today in good condition.  Follow-up Plans & Appointments     Contact information for follow-up providers    Lendon Colonel, NP Follow up on 05/07/2017.   Specialties:  Nurse Practitioner, Radiology, Cardiology Why:  Cardiology Hospital Follow-up on 05/07/2017 at 9:30 AM.  Contact information: [address] [place] [address] [phone]        [facility] Follow up in 4 week(s).   Specialty:  Radiology Why:  stroke clinic. office will call with appt date and time Contact information: [address] [place], Pete Pelt, MD Follow up on 05/06/2017.   Specialty:  Family Medicine Why:  Please arrive 15 minutes early for your 9:10AM appointment. Contact information: [address] [place] [address] [phone]            Contact information for after-discharge care    [facility] .   Service:  Skilled Nursing Contact information: [place] [phone]                 Discharge Instructions    Amb Referral to Cardiac Rehabilitation   Complete by:  As directed    Diagnosis:   NSTEMI Coronary Stents     Ambulatory referral to Neurology   Complete by:  As directed    Follow up with stroke clinic NP (Jessica Vanschaick or Cecille Rubin, if both not available, consider Dr. Antony Contras, Dr. Bess Harvest, or Dr. Sarina Ill) at [facility] in about 4 weeks.   Diet - low sodium heart healthy   Complete by:  As directed  Diet - low sodium heart healthy   Complete by:  As directed    Diet - low sodium heart healthy   Complete by:  As directed    Discharge instructions   Complete by:  As directed    No driving for 2 days. No  lifting over 5 lbs for 1 week. No sexual activity for 1 week. You may return to work in one week. Keep procedure site clean & dry. If you notice increased pain, swelling, bleeding or pus, call/return!  You may shower, but no soaking baths/hot tubs/pools for 1 week.   Increase activity slowly   Complete by:  As directed    Increase activity slowly   Complete by:  As directed    Increase activity slowly   Complete by:  As directed       Discharge Medications   Allergies as of 05/04/2017      Reactions   Lipitor [atorvastatin] Other (See Comments)   Cramping in feet that resolved after stopping med   Diclofenac Nausea Only, Other (See Comments)   Caused a lot of stomach pain       Medication List    TAKE these medications   ACCU-CHEK FASTCLIX LANCETS Misc 1 each by Does not apply route daily. Check sugar daily as needed   ACCU-CHEK NANO SMARTVIEW w/Device Kit Use to test blood sugar up to 3 times a day. ICD-10 code: E11.9   acetaminophen 500 MG tablet Commonly known as:  TYLENOL Take 1,000 mg by mouth every 12 (twelve) hours as needed for mild pain.   amLODipine 5 MG tablet Commonly known as:  NORVASC Take 1 tablet (5 mg total) by mouth daily. What changed:    medication strength  how much to take   aspirin EC 81 MG tablet Take 1 tablet (81 mg total) by mouth daily.   B-D UF III MINI PEN NEEDLES 31G X 5 MM Misc Generic drug:  Insulin Pen Needle ADMINISTER LANTUS DAILY AS DIRECTED   carvedilol 12.5 MG tablet Commonly known as:  COREG TAKE 1 TABLET BY MOUTH TWICE A DAY WITH MEALS   glucose blood test strip Commonly known as:  ACCU-CHEK AVIVA Use as instructed   glucose blood test strip Commonly known as:  ACCU-CHEK SMARTVIEW Use as instructed   LANTUS SOLOSTAR 100 UNIT/ML Solostar Pen Generic drug:  Insulin Glargine INJECT 50 UNITS INTO THE SKIN IN THE MORNING   lisinopril 20 MG tablet Commonly known as:  PRINIVIL,ZESTRIL Take 1 tablet (20 mg total) by  mouth daily. What changed:    medication strength  how much to take   metFORMIN 1000 MG tablet Commonly known as:  GLUCOPHAGE TAKE 1 TABLET BY MOUTH TWICE DAILY WITH A MEAL Resume on 04/30/17. What changed:    See the new instructions.  Another medication with the same name was removed. Continue taking this medication, and follow the directions you see here.   NOVOLOG FLEXPEN 100 UNIT/ML FlexPen Generic drug:  insulin aspart INJECT 20 UNITS INTO THE SKIN 3 TIMES DAILY WITH MEALS.   rosuvastatin 40 MG tablet Commonly known as:  CRESTOR Take 1 tablet (40 mg total) by mouth daily. What changed:    medication strength  how much to take   ticagrelor 90 MG Tabs tablet Commonly known as:  BRILINTA Take 1 tablet (90 mg total) by mouth 2 (two) times daily.   TRULICITY 8.85 OY/7.7AJ Sopn Generic drug:  Dulaglutide INJECT 0.75 MG INTO THE SKIN ONCE A WEEK.  Durable Medical Equipment  (From admission, onward)        Start     Ordered   04/28/17 1211  For home use only DME 4 wheeled rolling walker with seat  Once    Question:  Patient needs a walker to treat with the following condition  Answer:  Weakness generalized   04/28/17 1211       Aspirin prescribed at discharge?  Yes High Intensity Statin Prescribed? (Lipitor 40-19m or Crestor 20-460m: Yes Beta Blocker Prescribed? Yes For EF <40%, was ACEI/ARB Prescribed? Yes ADP Receptor Inhibitor Prescribed? (i.e. Plavix etc.-Includes Medically Managed Patients): Yes For EF <40%, Aldosterone Inhibitor Prescribed? No: nl EF Was EF assessed during THIS hospitalization? Yes Was Cardiac Rehab II ordered? (Included Medically managed Patients): Yes   Outstanding Labs/Studies   FLP and LFT's in 6-8 weeks  Follow A1c and insulin with PCP  Duration of Discharge Encounter   Greater than 30 minutes including physician time.  Signed, Leanor KailPA-C 05/04/2017, 11:18 AM   KaEna Dawley MD 05/04/2017

## 2017-04-28 NOTE — Progress Notes (Signed)
Today patient marginally more alert than yesterday.  Sleeps except when given verbal stimulus.  Very unstable on her feet.  Very poor appetite.PT, cardiac rehab, cardiology, SW, and care management have all seen patient.  BG high today secondary to holding of lantus last night.  Family remains at bedside.  Internal medicine consulted.

## 2017-04-28 NOTE — Consult Note (Signed)
Consult Note                                                      Holly Acosta  SMO:[id number]  DOB: November 29, 1955  DOA: 04/24/2017  PCP: Sela Hua, MD      Requesting physician: Dr. Johnsie Cancel (cardiology)    Reason for consultation: Altered mental status   History of Present llness   Holly Acosta is an 62 y.o. female with a diabetes mellitus type 2, OSA not on CPAP, history of positive stress test in 2008 with normal coronaries on cardiac cath, obesity, arthritis, hypertension and hyperlipidemia who is admitted to cardiology service for NST EMI on 4/8.  Patient initially presented with acute onset of substernal chest pain radiating to both arms while watching TV.  She was found to have T wave inversion in an LDH elevated troponin. Patient underwent left heart catheterization on 4/8 with finding of 99% stenosed proximal circumflex lesion followed by DES and started on DA PT with aspirin and Brilinta. Since the procedure she has been since the recovery from the procedure she has been found to be increasingly sleepy.  Patient was unable to participate with cardiac rehab this morning.  Initially thought this was following anesthesia prolonged recovery from ane she continues to be lethargic. Vitals have been stable afebrile. Labs early this morning was unremarkable except for mild leukocytosis (13 K) and elevated blood glucose in 200s.  Chest x-ray done this afternoon was negative for infiltrate.  Planned discharge home was canceled by primary team.  She has been nauseous since her cardiac cath and has vomited 4 times (twice yesterday and twice today).  Hospitalist consulted for further evaluation.  On my evaluation patient is sleepy, opens eyes spontaneously and answering question appropriately but again appears sleepy.  Patient denies any headache, blurred  vision, dizziness, fevers, chills, further chest pain, shortness of breath, abdominal pain, dysuria, diarrhea, tingling or numbness of her extremities.  She denies any recent illness, new medications, use of narcotics or benzodiazepines.  Has not received any pain medications benzodiazepines today.      Review Of Systems     In addition to the HPI above,  No Fever-chills, increased somnolence No Headache, No changes with Vision or hearing, No problems swallowing food or Liquids, No Chest pain, Cough or Shortness of Breath, Nausea and vomiting++, abdominal pain,  Bowel movements are regular, No Blood in stool or Urine, No dysuria, No new skin rashes or bruises, No new joints pains-aches,  No new weakness, tingling, numbness in any  extremity, No recent weight gain or loss, No polyuria, polydypsia or polyphagia, No significant Mental Stressors.    Social History   Social History   Tobacco Use  . Smoking status: Former Smoker    Packs/day: 1.00    Years: 33.00    Pack years: 33.00    Types: Cigarettes    Start date: 01/20/1973    Last attempt to quit: 11/04/2006    Years since quitting: 10.4  . Smokeless tobacco: Never Used  Substance Use Topics  . Alcohol use: Never    Alcohol/week: 0.0 oz    Frequency: Never     Family History   Family History  Problem Relation Age of Onset  . Diabetes Mother   . Hypertension Mother   . Hypertension Father      Medications   Prior to Admission medications   Medication Sig Start Date End Date Taking? Authorizing Provider  acetaminophen (TYLENOL) 500 MG tablet Take 1,000 mg by mouth every 12 (twelve) hours as needed for mild pain.    Yes [provider]  amLODipine (NORVASC) 10 MG tablet TAKE 1 TABLET BY MOUTH EVERY DAY Patient taking differently: TAKE 1 TABLET (51m)  BY MOUTH EVERY DAY 01/26/17  Yes PDonnamae Jude MD  aspirin EC 81 MG tablet Take 1 tablet (81 mg total) by mouth daily. 03/30/14  Yes JOlam Idler  MD  carvedilol (COREG) 12.5 MG tablet TAKE 1 TABLET BY MOUTH TWICE A DAY WITH MEALS 03/18/17  Yes Mayo, KPete Pelt MD  LANTUS SOLOSTAR 100 UNIT/ML Solostar Pen INJECT 50 UNITS INTO THE SKIN IN THE MORNING 02/25/17  Yes Mayo, KPete Pelt MD  lisinopril (PRINIVIL,ZESTRIL) 40 MG tablet Take 1 tablet (40 mg total) by mouth daily. 07/31/16  Yes Mayo, KPete Pelt MD  NOVOLOG FLEXPEN 100 UNIT/ML FlexPen INJECT 20 UNITS INTO THE SKIN 3 TIMES DAILY WITH MEALS. 01/26/17  Yes Mayo, KPete Pelt MD  ACCU-CHEK FASTCLIX LANCETS MISC 1 each by Does not apply route daily. Check sugar daily as needed 09/12/13   JOlam Idler MD  B-D UF III MINI PEN NEEDLES 31G X 5 MM MISC ADMINISTER LANTUS DAILY AS DIRECTED 04/24/17   Mayo, KPete Pelt MD  Blood Glucose Monitoring Suppl (ACCU-CHEK NANO SMARTVIEW) w/Device KIT Use to test blood sugar up to 3 times a day. ICD-10 code: E11.9 07/01/16   Mayo,Pete Pelt MD  glucose blood (ACCU-CHEK AVIVA) test strip Use as instructed 05/08/16   Mayo, KPete Pelt MD  glucose blood (ACCU-CHEK SMARTVIEW) test strip Use as instructed 06/30/16   Mayo, KPete Pelt MD  metFORMIN (GLUCOPHAGE) 1000 MG tablet TAKE 1 TABLET BY MOUTH TWICE A DAY WITH A MEAL Patient not taking: Reported on 04/24/2017 10/13/16   MSela Hua MD  metFORMIN (GLUCOPHAGE) 1000 MG tablet TAKE 1 TABLET BY MOUTH TWICE DAILY WITH A MEAL Resume on 04/30/17. 04/28/17   DLedora Bottcher PA  rosuvastatin (CRESTOR) 40 MG tablet Take 1 tablet (40 mg total) by mouth daily. 04/28/17   DLedora Bottcher PA  ticagrelor (BRILINTA) 90 MG TABS tablet Take 1 tablet (90 mg total) by mouth 2 (two) times daily. 04/28/17   [facility], ATami Lin PA  TRULICITY 03.41MPF/7.9KWSOPN INJECT 0.75 MG INTO THE SKIN ONCE A WEEK. Patient not taking: Reported on 04/24/2017 01/15/17   Mayo,Pete Pelt MD    Anti-infectives (From admission, onward)   None      Scheduled Meds: . amLODipine  10 mg Oral Daily  .  aspirin EC  81 mg Oral Daily  . carvedilol  12.5 mg  Oral BID WC  . insulin aspart  0-15 Units Subcutaneous TID WC  . insulin aspart  0-5 Units Subcutaneous QHS  . insulin glargine  25 Units Subcutaneous Q2200  . lisinopril  40 mg Oral Daily  . rosuvastatin  40 mg Oral Daily  . ticagrelor  90 mg Oral BID   Continuous Infusions: . sodium chloride     PRN Meds:.sodium chloride, acetaminophen, acetaminophen, magnesium hydroxide, ondansetron (ZOFRAN) IV, sodium chloride flush  Allergies  Allergen Reactions  . Lipitor [Atorvastatin] Other (See Comments)    Cramping in feet that resolved after stopping med  . Diclofenac Nausea Only and Other (See Comments)    Caused a lot of stomach pain     Objective   Physical Exam  Vitals  Blood pressure (!) 147/81, pulse 90, temperature (!) 97.4 F (36.3 C), resp. rate (!) 24, height _0  (1.651 m), weight 83 kg (182 lb 15.7 oz), SpO2 91 %.   General: Middle-aged obese female lying in bed appears sleepy but easily arousable HEENT: Pupils reactive bilaterally, EOMI, no pallor, no icterus, moist mucosa, supple neck, no cervical lymphadenopathy Chest: Clear to auscultation bilaterally, no added sound CVS: Normal sinus rhythm, no murmur rub or gallop GI: Soft, nondistended, nontender, bowel sounds present Musculoskeletal: Warm, no edema CNS: Sleepy but easily arousable, oriented x3, no flapping tremors, normal power and tone in all extremities.    Data   CBC Recent Labs  Lab 04/24/17 0023 04/25/17 0258 04/26/17 0406 04/27/17 0326 04/28/17 0246  WBC 8.9 10.4 10.5 10.7* 13.3*  HGB 13.7 13.8 13.4 12.9 13.9  HCT 42.0 43.0 42.1 40.2 42.7  PLT 245 263 239 238 266  MCV 87.7 87.0 87.3 87.8 87.5  MCH 28.6 27.9 27.8 28.2 28.5  MCHC 32.6 32.1 31.8 32.1 32.6  RDW 13.9 14.3 14.1 14.2 14.2   ------------------------------------------------------------------------------------------------------------------  Chemistries  Recent Labs  Lab 04/24/17 0023 04/25/17 0258 04/26/17 0406  04/28/17 0246  NA 133* 133* 134* 135  K 4.0 3.8 4.3 4.5  CL 98* 98* 101 102  CO2 23 23 21* 22  GLUCOSE 231* 283* 317* 272*  BUN 21* _1 CREATININE 0.98 0.97 0.96 0.94  CALCIUM 9.5 9.2 9.3 9.4   ------------------------------------------------------------------------------------------------------------------ estimated creatinine clearance is 66.9 mL/min (by C-G formula based on SCr of 0.94 mg/dL). ------------------------------------------------------------------------------------------------------------------ No results for input(s): TSH, T4TOTAL, T3FREE, THYROIDAB in the last 72 hours.  Invalid input(s): FREET3   Coagulation profile Recent Labs  Lab 04/25/17 0258  INR 1.02   ------------------------------------------------------------------------------------------------------------------- No results for input(s): DDIMER in the last 72 hours. -------------------------------------------------------------------------------------------------------------------  Cardiac Enzymes Recent Labs  Lab 04/24/17 1253 04/24/17 1610 04/25/17 0058  TROPONINI 0.83* 1.83* >65.00*   ------------------------------------------------------------------------------------------------------------------ Invalid input(s): POCBNP   ---------------------------------------------------------------------------------------------------------------  Urinalysis    Component Value Date/Time   COLORURINE lt. yellow 07/07/2008 1359   APPEARANCEUR Clear 07/07/2008 1359   LABSPEC 1.010 08/10/2009 1047   PHURINE 6.5 08/10/2009 1047   HGBUR negative 08/10/2009 1047   BILIRUBINUR NEG 03/19/2012 1100   PROTEINUR 30 03/19/2012 1100   UROBILINOGEN 0.2 03/19/2012 1100   UROBILINOGEN negative 08/10/2009 1047   NITRITE NEG 03/19/2012 1100   NITRITE negative 08/10/2009 1047   LEUKOCYTESUR Negative 03/19/2012 1100     Imaging    Dg Chest Port 1v Same Day  Result Date: 04/28/2017 CLINICAL DATA:   Pneumonia. Shortness of breath. Dizziness on exertion. EXAM: PORTABLE CHEST 1  VIEW COMPARISON:  04/24/2017. FINDINGS: Mediastinum and hilar structures normal. Heart size stable. No pulmonary venous congestion. Low lung volumes with mild basilar atelectasis. No pleural effusion or pneumothorax. Degenerative change thoracic spine. Thoracic scoliosis. IMPRESSION: Low lung volumes with mild basilar atelectasis, otherwise negative chest. Chest is stable from 04/24/2017. Electronically Signed   By: Marcello Moores  Register   On: 04/28/2017 12:58    My personal review of EKG: Normal sinus rhythm at 72 with LVH    Assessment & Paln   Principal problem Acute metabolic encephalopathy ([place])  No clear etiology.  Differential includes infection (UTI), post anesthetic effect, acute gastroenteritis versus hypercapnia. CBGs have been elevated. Check UA, repeat BMET, TSH, ammonia and obtain PET/CT. Avoid any narcotics or benzodiazepines.  Neurochecks.  . Non-ST elevation (NSTEMI) myocardial infarction [facility]) Management per primary.  Cardiac cath with DES to ostial circumflex.  No further chest pain symptoms. Started on ASA and brilinta.  On statin.   Active Problems: Nausea and vomiting Abdominal exam benign.  Chest x-ray negative for any infiltrate.  Check UA and labs as mentioned above.  Placed on clears.  PRN Zofran.    DM (diabetes mellitus), type 2, uncontrolled (HCC) Continue bedtime Lantus with sliding scale coverage.  CBG in 200s.  Monitor for any hypoglycemic symptoms.  Essential hypertension Pressure mildly elevated.  Continue current medications.   OSA Not on CPAP.  check ABG.      DVT Prophylaxis: SCDs    Family Communication: Plan discussed with patient and her granddaughter is at bedside   Thank you for the consult, we will follow the patient with you in the Hospital.   Flonnie Overman Miki Blank M.D on 04/28/2017 at 4:08 PM  Between 7am to 7pm - Pager - [phone].  After 7pm go to [email or web address] - password [email or web address]  [facility]  - Office  [phone]   Thank you for the consult, we will follow the patient with you in the Hospital.  Total time spent on consult: 50 minutes.

## 2017-04-28 NOTE — Telephone Encounter (Signed)
TOC Patient-Please call Patient-Patient has an appointment with Jory Sims on 05-07-17.

## 2017-04-28 NOTE — H&P (Deleted)
Consult Note                                                      Holly Acosta  SFK:[id number]  DOB: Jul 12, 1955  DOA: 04/24/2017  PCP: Sela Hua, MD      Requesting physician: Dr. Johnsie Cancel (cardiology)  Reason for consultation: Altered mental status  History of Present llness   Holly Acosta is an 62 y.o. female with a diabetes mellitus type 2, OSA not on CPAP, history of positive stress test in 2008 with normal coronaries on cardiac cath, obesity, arthritis, hypertension and hyperlipidemia who is admitted to cardiology service for NST EMI on 4/8.  Patient initially presented with acute onset of substernal chest pain radiating to both arms while watching TV.  She was found to have T wave inversion in an LDH elevated troponin. Patient underwent left heart catheterization on 4/8 with finding of 99% stenosed proximal circumflex lesion followed by DES and started on DA PT with aspirin and Brilinta. Since the procedure she has been since the recovery from the procedure she has been found to be increasingly sleepy.  Patient was unable to participate with cardiac rehab this morning.  Initially thought this was following anesthesia prolonged recovery from ane she continues to be lethargic. Vitals have been stable afebrile. Labs early this morning was unremarkable except for mild leukocytosis (13 K) and elevated blood glucose in 200s.  Chest x-ray done this afternoon was negative for infiltrate.  Planned discharge home was canceled by primary team.  She has been nauseous since her cardiac cath and has vomited 4 times (twice yesterday and twice today).  Hospitalist consulted for further evaluation.  On my evaluation patient is sleepy, opens eyes spontaneously and answering question appropriately but again appears sleepy.  Patient denies any headache, blurred vision,  dizziness, fevers, chills, further chest pain, shortness of breath, abdominal pain, dysuria, diarrhea, tingling or numbness of her extremities.  She denies any recent illness, new medications, use of narcotics or benzodiazepines.  Has not received any pain medications benzodiazepines today.    Review Of Systems     In addition to the HPI above,  No Fever-chills, No Headache, No changes with Vision or hearing, No problems swallowing food or Liquids, No Chest pain, Cough or Shortness of Breath, Nausea and vomiting++, abdominal pain,  Bowel movements are regular, No Blood in stool or Urine, No dysuria, No new skin rashes or bruises, No new joints pains-aches,  No new weakness, tingling, numbness in any extremity, No recent weight gain or loss,  No polyuria, polydypsia or polyphagia, No significant Mental Stressors.    Social History   Social History   Tobacco Use  . Smoking status: Former Smoker    Packs/day: 1.00    Years: 33.00    Pack years: 33.00    Types: Cigarettes    Start date: 01/20/1973    Last attempt to quit: 11/04/2006    Years since quitting: 10.4  . Smokeless tobacco: Never Used  Substance Use Topics  . Alcohol use: Never    Alcohol/week: 0.0 oz    Frequency: Never     Family History   Family History  Problem Relation Age of Onset  . Diabetes Mother   . Hypertension Mother   . Hypertension Father      Medications   Prior to Admission medications   Medication Sig Start Date End Date Taking? Authorizing Provider  acetaminophen (TYLENOL) 500 MG tablet Take 1,000 mg by mouth every 12 (twelve) hours as needed for mild pain.    Yes [provider]  amLODipine (NORVASC) 10 MG tablet TAKE 1 TABLET BY MOUTH EVERY DAY Patient taking differently: TAKE 1 TABLET ('10mg'$ )  BY MOUTH EVERY DAY 01/26/17  Yes Donnamae Jude, MD  aspirin EC 81 MG tablet Take 1 tablet (81 mg total) by mouth daily. 03/30/14  Yes Olam Idler, MD  carvedilol (COREG) 12.5 MG  tablet TAKE 1 TABLET BY MOUTH TWICE A DAY WITH MEALS 03/18/17  Yes Mayo, Pete Pelt, MD  LANTUS SOLOSTAR 100 UNIT/ML Solostar Pen INJECT 50 UNITS INTO THE SKIN IN THE MORNING 02/25/17  Yes Mayo, Pete Pelt, MD  lisinopril (PRINIVIL,ZESTRIL) 40 MG tablet Take 1 tablet (40 mg total) by mouth daily. 07/31/16  Yes Mayo, Pete Pelt, MD  NOVOLOG FLEXPEN 100 UNIT/ML FlexPen INJECT 20 UNITS INTO THE SKIN 3 TIMES DAILY WITH MEALS. 01/26/17  Yes Mayo, Pete Pelt, MD  ACCU-CHEK FASTCLIX LANCETS MISC 1 each by Does not apply route daily. Check sugar daily as needed 09/12/13   Olam Idler, MD  B-D UF III MINI PEN NEEDLES 31G X 5 MM MISC ADMINISTER LANTUS DAILY AS DIRECTED 04/24/17   Mayo, Pete Pelt, MD  Blood Glucose Monitoring Suppl (ACCU-CHEK NANO SMARTVIEW) w/Device KIT Use to test blood sugar up to 3 times a day. ICD-10 code: E11.9 07/01/16   MayoPete Pelt, MD  glucose blood (ACCU-CHEK AVIVA) test strip Use as instructed 05/08/16   Mayo, Pete Pelt, MD  glucose blood (ACCU-CHEK SMARTVIEW) test strip Use as instructed 06/30/16   Mayo, Pete Pelt, MD  metFORMIN (GLUCOPHAGE) 1000 MG tablet TAKE 1 TABLET BY MOUTH TWICE A DAY WITH A MEAL Patient not taking: Reported on 04/24/2017 10/13/16   Sela Hua, MD  metFORMIN (GLUCOPHAGE) 1000 MG tablet TAKE 1 TABLET BY MOUTH TWICE DAILY WITH A MEAL Resume on 04/30/17. 04/28/17   Ledora Bottcher, PA  rosuvastatin (CRESTOR) 40 MG tablet Take 1 tablet (40 mg total) by mouth daily. 04/28/17   Ledora Bottcher, PA  ticagrelor (BRILINTA) 90 MG TABS tablet Take 1 tablet (90 mg total) by mouth 2 (two) times daily. 04/28/17   [facility], Tami Lin, PA  TRULICITY 5.95 GL/8.7FI SOPN INJECT 0.75 MG INTO THE SKIN ONCE A WEEK. Patient not taking: Reported on 04/24/2017 01/15/17   MayoPete Pelt, MD    Anti-infectives (From admission, onward)   None      Scheduled Meds: . amLODipine  10 mg Oral Daily  . aspirin EC  81 mg Oral  Daily  . carvedilol  12.5 mg Oral BID WC  . insulin aspart   0-15 Units Subcutaneous TID WC  . insulin aspart  0-5 Units Subcutaneous QHS  . insulin glargine  25 Units Subcutaneous Q2200  . lisinopril  40 mg Oral Daily  . rosuvastatin  40 mg Oral Daily  . ticagrelor  90 mg Oral BID   Continuous Infusions: . sodium chloride     PRN Meds:.sodium chloride, acetaminophen, acetaminophen, magnesium hydroxide, ondansetron (ZOFRAN) IV, sodium chloride flush  Allergies  Allergen Reactions  . Lipitor [Atorvastatin] Other (See Comments)    Cramping in feet that resolved after stopping med  . Diclofenac Nausea Only and Other (See Comments)    Caused a lot of stomach pain     Objective   Physical Exam  Vitals  Blood pressure (!) 147/81, pulse 90, temperature (!) 97.4 F (36.3 C), resp. rate (!) 24, height _0  (1.651 m), weight 83 kg (182 lb 15.7 oz), SpO2 91 %.   General: Middle-aged obese female lying in bed appears sleepy but easily arousable HEENT: Pupils reactive bilaterally, EOMI, no pallor, no icterus, moist mucosa, supple neck, no cervical lymphadenopathy Chest: Clear to auscultation bilaterally, no added sound CVS: Normal sinus rhythm, no murmur rub or gallop GI: Soft, nondistended, nontender, bowel sounds present Musculoskeletal: Warm, no edema CNS: Sleepy but easily arousable, oriented x3, no flapping tremors, normal power and tone in all extremities.  Data   CBC Recent Labs  Lab 04/24/17 0023 04/25/17 0258 04/26/17 0406 04/27/17 0326 04/28/17 0246  WBC 8.9 10.4 10.5 10.7* 13.3*  HGB 13.7 13.8 13.4 12.9 13.9  HCT 42.0 43.0 42.1 40.2 42.7  PLT 245 263 239 238 266  MCV 87.7 87.0 87.3 87.8 87.5  MCH 28.6 27.9 27.8 28.2 28.5  MCHC 32.6 32.1 31.8 32.1 32.6  RDW 13.9 14.3 14.1 14.2 14.2   ------------------------------------------------------------------------------------------------------------------  Chemistries  Recent Labs  Lab 04/24/17 0023 04/25/17 0258 04/26/17 0406 04/28/17 0246  NA 133* 133* 134* 135  K 4.0  3.8 4.3 4.5  CL 98* 98* 101 102  CO2 23 23 21* 22  GLUCOSE 231* 283* 317* 272*  BUN 21* _1 CREATININE 0.98 0.97 0.96 0.94  CALCIUM 9.5 9.2 9.3 9.4   ------------------------------------------------------------------------------------------------------------------ estimated creatinine clearance is 66.9 mL/min (by C-G formula based on SCr of 0.94 mg/dL). ------------------------------------------------------------------------------------------------------------------ No results for input(s): TSH, T4TOTAL, T3FREE, THYROIDAB in the last 72 hours.  Invalid input(s): FREET3   Coagulation profile Recent Labs  Lab 04/25/17 0258  INR 1.02   ------------------------------------------------------------------------------------------------------------------- No results for input(s): DDIMER in the last 72 hours. -------------------------------------------------------------------------------------------------------------------  Cardiac Enzymes Recent Labs  Lab 04/24/17 1253 04/24/17 1610 04/25/17 0058  TROPONINI 0.83* 1.83* >65.00*   ------------------------------------------------------------------------------------------------------------------ Invalid input(s): POCBNP   ---------------------------------------------------------------------------------------------------------------  Urinalysis    Component Value Date/Time   COLORURINE lt. yellow 07/07/2008 1359   APPEARANCEUR Clear 07/07/2008 1359   LABSPEC 1.010 08/10/2009 1047   PHURINE 6.5 08/10/2009 1047   HGBUR negative 08/10/2009 1047   BILIRUBINUR NEG 03/19/2012 1100   PROTEINUR 30 03/19/2012 1100   UROBILINOGEN 0.2 03/19/2012 1100   UROBILINOGEN negative 08/10/2009 1047   NITRITE NEG 03/19/2012 1100   NITRITE negative 08/10/2009 1047   LEUKOCYTESUR Negative 03/19/2012 1100     Imaging    Dg Chest Port 1v Same Day  Result Date: 04/28/2017 CLINICAL DATA:  Pneumonia. Shortness of breath. Dizziness on  exertion. EXAM: PORTABLE CHEST 1 VIEW COMPARISON:  04/24/2017. FINDINGS: Mediastinum and hilar  structures normal. Heart size stable. No pulmonary venous congestion. Low lung volumes with mild basilar atelectasis. No pleural effusion or pneumothorax. Degenerative change thoracic spine. Thoracic scoliosis. IMPRESSION: Low lung volumes with mild basilar atelectasis, otherwise negative chest. Chest is stable from 04/24/2017. Electronically Signed   By: Marcello Moores  Register   On: 04/28/2017 12:58    My personal review of EKG: Normal sinus rhythm at 72 with LVH  Assessment & Paln   Principal problem Acute metabolic encephalopathy ([place])  No clear etiology.  Differential includes infection (UTI), post anesthetic effect, acute gastroenteritis versus hypercapnia. CBGs have been elevated. Check UA, repeat BMET, TSH, ammonia and obtain PET/CT. Avoid any narcotics or benzodiazepines.  Neurochecks.  . Non-ST elevation (NSTEMI) myocardial infarction [facility]) Management per primary.  Cardiac cath with DES to ostial circumflex.  No further chest pain symptoms. Started on ASA and brilinta.  On statin.   Active Problems: Nausea and vomiting Abdominal exam benign.  Chest x-ray negative for any infiltrate.  Check UA and labs as mentioned above.  Placed on clears.  PRN Zofran.    DM (diabetes mellitus), type 2, uncontrolled (HCC) Continue bedtime Lantus with sliding scale coverage.  CBG in 200s.  Monitor for any hypoglycemic symptoms.  Essential hypertension Pressure mildly elevated.  Continue current medications.   OSA Not on CPAP.  check ABG.      DVT Prophylaxis: SCDs    Family Communication: Plan discussed with patient and her granddaughter is at bedside   Thank you for the consult, we will follow the patient with you in the Hospital.   Flonnie Overman Skyah Hannon M.D on 04/28/2017 at 4:08 PM  Between 7am to 7pm - Pager - [phone]. After 7pm go to [email or web address] - password [email or web address]  [facility]  -  Office  [phone]   Thank you for the consult, we will follow the patient with you in the Hospital.  Total time spent on consult: 50 minutes.

## 2017-04-29 ENCOUNTER — Inpatient Hospital Stay (HOSPITAL_COMMUNITY): Payer: Medicaid Other

## 2017-04-29 DIAGNOSIS — R778 Other specified abnormalities of plasma proteins: Secondary | ICD-10-CM | POA: Insufficient documentation

## 2017-04-29 DIAGNOSIS — Z955 Presence of coronary angioplasty implant and graft: Secondary | ICD-10-CM

## 2017-04-29 DIAGNOSIS — I639 Cerebral infarction, unspecified: Secondary | ICD-10-CM

## 2017-04-29 DIAGNOSIS — I214 Non-ST elevation (NSTEMI) myocardial infarction: Secondary | ICD-10-CM

## 2017-04-29 DIAGNOSIS — I63442 Cerebral infarction due to embolism of left cerebellar artery: Secondary | ICD-10-CM

## 2017-04-29 DIAGNOSIS — R7989 Other specified abnormal findings of blood chemistry: Secondary | ICD-10-CM

## 2017-04-29 HISTORY — DX: Cerebral infarction, unspecified: I63.9

## 2017-04-29 LAB — LIPID PANEL
CHOL/HDL RATIO: 5.1 ratio
Cholesterol: 173 mg/dL (ref 0–200)
HDL: 34 mg/dL — AB (ref >40)
LDL CALC: 107 mg/dL — AB (ref 0–99)
Triglycerides: 159 mg/dL — ABNORMAL HIGH (ref <150)
VLDL: 32 mg/dL (ref 0–40)

## 2017-04-29 LAB — ECHOCARDIOGRAM LIMITED
Height: 65 in
Weight: 3001.78 oz

## 2017-04-29 LAB — CBC
HEMATOCRIT: 41 % (ref 36.0–46.0)
Hemoglobin: 13.2 g/dL (ref 12.0–15.0)
MCH: 28.1 pg (ref 26.0–34.0)
MCHC: 32.2 g/dL (ref 30.0–36.0)
MCV: 87.2 fL (ref 78.0–100.0)
Platelets: 284 10*3/uL (ref 150–400)
RBC: 4.7 MIL/uL (ref 3.87–5.11)
RDW: 14.1 % (ref 11.5–15.5)
WBC: 13.9 10*3/uL — AB (ref 4.0–10.5)

## 2017-04-29 LAB — GLUCOSE, CAPILLARY
GLUCOSE-CAPILLARY: 286 mg/dL — AB (ref 65–99)
GLUCOSE-CAPILLARY: 270 mg/dL — AB (ref 65–99)
GLUCOSE-CAPILLARY: 224 mg/dL — AB (ref 65–99)
Glucose-Capillary: 284 mg/dL — ABNORMAL HIGH (ref 65–99)

## 2017-04-29 LAB — HEMOGLOBIN A1C
Hgb A1c MFr Bld: 13.3 % — ABNORMAL HIGH (ref 4.8–5.6)
MEAN PLASMA GLUCOSE: 335.01 mg/dL

## 2017-04-29 MED ORDER — IOPAMIDOL (ISOVUE-370) INJECTION 76%
INTRAVENOUS | Status: AC
  Administered 2017-04-29: 50 mL
  Filled 2017-04-29: qty 50

## 2017-04-29 NOTE — [place] FL2 (Signed)
[place] MEDICAID FL2 LEVEL OF CARE SCREENING TOOL     IDENTIFICATION  Patient Name: Holly Acosta Birthdate: [birth year] Sex: female Admission Date (Current Location): 04/24/2017  [facility] and [place] Number:  Herbalist and Address:  [facility] [place], [place] [address], [place], [place] [address]      Provider Number: [id number]  Attending Physician Name and Address:  Lelon Perla, MD  Relative Name and Phone Number:       Current Level of Care: Hospital Recommended Level of Care: [place] Prior Approval Number:    Date Approved/Denied:   PASRR Number: [id number]  Discharge Plan: SNF    Current Diagnoses: Patient Active Problem List   Diagnosis Date Noted  . CVA (cerebral vascular accident) ([place]) 04/29/2017  . Non-ST elevation (NSTEMI) myocardial infarction ([place])   . Chest pain 04/24/2017  . Left hip pain 02/23/2014  . Obesity 01/29/2012  . Osteoarthritis, knee 12/11/2011  . COLONIC POLYPS, HYPERPLASTIC 09/13/2009  . THYROMEGALY 08/10/2009  . SLEEP APNEA 08/10/2009  . VENTRAL HERNIA 01/26/2009  . DEPRESSION 07/07/2008  . DM (diabetes mellitus), type 2, uncontrolled ([place]) 05/25/2007  . HYPERCHOLESTEROLEMIA 04/02/2007  . HYPERTENSION, BENIGN ESSENTIAL 04/02/2007  . OA (osteoarthritis) of knee 04/02/2007  . UTERINE PROLAPSE 01/20/2006    Orientation RESPIRATION BLADDER Height & Weight     Self, Time, Situation, Place  Normal Continent, External catheter(catheter placed 04/27/17) Weight: 187 lb 9.8 oz (85.1 kg) Height:  5\' 5"  (165.1 cm)  BEHAVIORAL SYMPTOMS/MOOD NEUROLOGICAL BOWEL NUTRITION STATUS      Continent Diet(heart healthy, carb modified)  AMBULATORY STATUS COMMUNICATION OF NEEDS Skin   Limited Assist Verbally Normal                       Personal Care Assistance Level of Assistance  Bathing, Feeding, Dressing Bathing Assistance: Limited assistance Feeding assistance: Independent Dressing  Assistance: Limited assistance     Functional Limitations Info  Sight, Hearing, Speech Sight Info: Adequate Hearing Info: Adequate Speech Info: Adequate    SPECIAL CARE FACTORS FREQUENCY  PT (By licensed PT), OT (By licensed OT), Speech therapy     PT Frequency: 5x/wk OT Frequency: 5x/wk     Speech Therapy Frequency: 5x/wk      Contractures Contractures Info: Not present    Additional Factors Info  Code Status, Allergies, Insulin Sliding Scale Code Status Info: Full Allergies Info: Lipitor Atorvastatin, Diclofenac   Insulin Sliding Scale Info: 0-15 units 3x/day with meals; 0-5 units daily at bedtime; Lantus 25 units daily at 10pm       Current Medications (04/29/2017):  This is the current hospital active medication list Current Facility-Administered Medications  Medication Dose Route Frequency Provider Last Rate Last Dose  . 0.9 %  sodium chloride infusion  250 mL Intravenous PRN Jettie Booze, MD      . acetaminophen (TYLENOL) tablet 650 mg  650 mg Oral Q4H PRN Jettie Booze, MD      . acetaminophen (TYLENOL) tablet 650 mg  650 mg Oral Q4H PRN Jettie Booze, MD      . amLODipine (NORVASC) tablet 10 mg  10 mg Oral Daily Jettie Booze, MD   10 mg at 04/29/17 1059  . aspirin EC tablet 81 mg  81 mg Oral Daily Jettie Booze, MD   81 mg at 04/29/17 1059  . carvedilol (COREG) tablet 12.5 mg  12.5 mg Oral BID WC Jettie Booze, MD  12.5 mg at 04/29/17 1059  . insulin aspart (novoLOG) injection 0-15 Units  0-15 Units Subcutaneous TID WC Jettie Booze, MD   8 Units at 04/29/17 1429  . insulin aspart (novoLOG) injection 0-5 Units  0-5 Units Subcutaneous QHS Jettie Booze, MD   3 Units at 04/28/17 2226  . insulin glargine (LANTUS) injection 25 Units  25 Units Subcutaneous Q2200 Jettie Booze, MD   25 Units at 04/28/17 2217  . lisinopril (PRINIVIL,ZESTRIL) tablet 40 mg  40 mg Oral Daily Jettie Booze, MD   40 mg at  04/29/17 1059  . magnesium hydroxide (MILK OF MAGNESIA) suspension 30 mL  30 mL Oral Daily PRN Jettie Booze, MD   30 mL at 04/26/17 2117  . ondansetron (ZOFRAN) injection 4 mg  4 mg Intravenous Q6H PRN Jettie Booze, MD   4 mg at 04/28/17 1449  . rosuvastatin (CRESTOR) tablet 40 mg  40 mg Oral Daily Jettie Booze, MD   40 mg at 04/29/17 1100  . sodium chloride flush (NS) 0.9 % injection 3 mL  3 mL Intravenous PRN Jettie Booze, MD      . ticagrelor [facility]) tablet 90 mg  90 mg Oral BID Jettie Booze, MD   90 mg at 04/29/17 1059     Discharge Medications: Please see discharge summary for a list of discharge medications.  Relevant Imaging Results:  Relevant Lab Results:   Additional Information SS#: [id number]  Geralynn Ochs, LCSW

## 2017-04-29 NOTE — Progress Notes (Signed)
  Echocardiogram 2D Echocardiogram has been performed.  Holly Acosta L Androw 04/29/2017, 12:34 PM

## 2017-04-29 NOTE — Care Management Note (Signed)
Case Management Note  Patient Details  Name: Holly Acosta MRN: [id number] Date of Birth: Oct 18, 1955  Subjective/Objective:                    Action/Plan: Pt is s/p CVA. PT recommending SNF. Initially patient going to d/c home with [facility] services but family deciding on rehab currently. CM following for d/c disposition.   Expected Discharge Date:  04/28/17               Expected Discharge Plan:  [place]  In-House Referral:  NA  Discharge planning Services  CM Consult, Medication Assistance  Post Acute Care Choice:  Durable Medical Equipment, Home Health Choice offered to:  Adult Children  DME Arranged:  [place] rolling with seat DME Agency:  [place] Arranged:  OT, PT [place] Agency:  [place]  Status of Service:  In process, will continue to follow  If discussed at Long Length of Stay Meetings, dates discussed:    Additional Comments:  Pollie Friar, RN 04/29/2017, 1:21 PM

## 2017-04-29 NOTE — Progress Notes (Signed)
Pt.with order to transfer to neuro tele as per Dr.Vedre. Report given to [facility]. V/s stable ;alert ,oriented x 4 ;still drowsy.Transferred to 3W 22 by SWOT RN Nira Conn without difficulty.Family at bedside.

## 2017-04-29 NOTE — Consult Note (Signed)
Requesting Physician: Dr. Stanford Breed,     Chief Complaint:   History obtained from: Patient and Chart     HPI:                                                                                                                                       Holly Acosta is an 62 y.o.femalewith HTN, diabetes mellitus type 2, OSA not on CPAP, history of positive stress test in 2008 with normal coronaries on cardiac cath, obesity, arthritis, hyperlipidemia admitted to cardiology service for NST EMI on 4/8.   Patient presented on 4/8 with acute onset chest pain while watching TV. She was found to have a T-wave  Inversion and elevated troponins. Underwent left heart catheterizat with 99% stenosis of proximal circumflex lesion and underwent cardiac stent. She was started on dual antiplatelet therapy with aspirin and Brilinta.   Since the procedure she has been found to be increasingly sleepy and unable to participate with cardiac rehab. A CT scan was ordered shows acute to subacute left PICA infarct  Date last known well: 04/27/17 tPA Given: outside window at time of discovery of stroke Baseline MRS 0    Past Medical History:  Diagnosis Date  . Anemia   . Arthritis of knee    "both knees" (04/24/2017)  . Diabetes mellitus, type 2 ([place])   . History of blood transfusion    "related to prolapsed uterus"  . HLD (hyperlipidemia)   . HTN (hypertension)   . Prolapsed uterus   . Sleep apnea    "have mask; haven't worn it for a long time" (04/24/2017)    Past Surgical History:  Procedure Laterality Date  . CARDIAC CATHETERIZATION  2011  . CORONARY STENT INTERVENTION N/A 04/27/2017   Procedure: CORONARY STENT INTERVENTION;  Surgeon: Jettie Booze, MD;  Location: [facility];  Service: Cardiovascular;  Laterality: N/A;  . LEFT HEART CATH AND CORONARY ANGIOGRAPHY N/A 04/27/2017   Procedure: LEFT HEART CATH AND CORONARY ANGIOGRAPHY;  Surgeon: Jettie Booze, MD;  Location: [facility];   Service: Cardiovascular;  Laterality: N/A;  . TUBAL LIGATION      Family History  Problem Relation Age of Onset  . Diabetes Mother   . Hypertension Mother   . Hypertension Father    Social History:  reports that she quit smoking about 10 years ago. Her smoking use included cigarettes. She started smoking about 44 years ago. She has a 33.00 pack-year smoking history. She has never used smokeless tobacco. She reports that she does not drink alcohol or use drugs.  Allergies:  Allergies  Allergen Reactions  . Lipitor [Atorvastatin] Other (See Comments)    Cramping in feet that resolved after stopping med  . Diclofenac Nausea Only and Other (See Comments)    Caused a lot of stomach pain     Medications:  I reviewed home medications   ROS:                                                                                                                                     14 systems reviewed and negative except above    Examination:                                                                                                      General: Appears well-developed and well-nourished.  Psych: Affect appropriate to situation Eyes: No scleral injection HENT: No OP obstrucion Head: Normocephalic.  Cardiovascular: Normal rate and regular rhythm.  Respiratory: Effort normal and breath sounds normal to anterior ascultation GI: Soft.  No distension. There is no tenderness.  Skin: WDI   Neurological Examination Mental Status: Alert, oriented, thought content appropriate.  Speech fluent without evidence of aphasia. Mild dysarthria. Able to follow 3 step commands without difficulty. Cranial Nerves: II: Visual fields grossly normal, III,IV, VI: ptosis present in left eye , extra-ocular motions intact bilaterally, pupils equal, round, reactive to light and accommodation. No  miosis noted  V,VII: smile symmetric, facial light touch sensation normal bilaterally VIII: hearing normal bilaterally IX,X: uvula rises symmetrically XI: bilateral shoulder shrug XII: midline tongue extension Motor: Right : Upper extremity   5/5    Left:     Upper extremity   5/5  Lower extremity   5/5     Lower extremity   5/5 Tone and bulk:normal tone throughout; no atrophy noted Sensory: Pinprick and light touch intact throughout, bilaterally Deep Tendon Reflexes: 2+ and symmetric throughout Plantars: Right: downgoing   Left: downgoing Cerebellar:  finger-to-nose shows no significant ataxia on either arm  Gait: normal gait and station     Lab Results: Basic Metabolic Panel: Recent Labs  Lab 04/24/17 0023 04/25/17 0258 04/26/17 0406 04/28/17 0246 04/28/17 1634  NA 133* 133* 134* 135 134*  K 4.0 3.8 4.3 4.5 4.1  CL 98* 98* 101 102 100*  CO2 23 23 21* 22 24  GLUCOSE 231* 283* 317* 272* 280*  BUN 21* 14 15 14 16   CREATININE 0.98 0.97 0.96 0.94 0.98  CALCIUM 9.5 9.2 9.3 9.4 9.3    CBC: Recent Labs  Lab 04/24/17 0023 04/25/17 0258 04/26/17 0406 04/27/17 0326 04/28/17 0246  WBC 8.9 10.4 10.5 10.7* 13.3*  HGB 13.7 13.8 13.4 12.9 13.9  HCT 42.0 43.0 42.1 40.2 42.7  MCV 87.7 87.0 87.3 87.8 87.5  PLT 245 263 239 238  266    Coagulation Studies: No results for input(s): LABPROT, INR in the last 72 hours.  Imaging: Ct Head Wo Contrast  Result Date: 04/28/2017 CLINICAL DATA:  Altered mental status EXAM: CT HEAD WITHOUT CONTRAST TECHNIQUE: Contiguous axial images were obtained from the base of the skull through the vertex without intravenous contrast. COMPARISON:  None. FINDINGS: Brain: There is hypoattenuation within the medial left cerebellar hemisphere consistent with acute to early subacute infarct. There is mass effect on the fourth ventricle. No hydrocephalus. No intracranial hemorrhage or extra-axial collection. Bilateral basal ganglia mineralization. Otherwise  normal appearance of the brain for age. Vascular: Atherosclerotic calcification of the internal carotid arteries at the skull base. No hyperdense vessel. Skull: Normal visualized skull base, calvarium and extracranial soft tissues. Sinuses/Orbits: No sinus fluid levels or advanced mucosal thickening. No mastoid effusion. Normal orbits. IMPRESSION: Acute to early subacute left posterior inferior cerebellar artery territory infarct without hemorrhage. Mild mass effect on the fourth ventricle, but no hydrocephalus. These results will be called to the ordering clinician or representative by the Radiologist Assistant, and communication documented in the PACS or zVision Dashboard. Electronically Signed   By: Ulyses Jarred M.D.   On: 04/28/2017 20:00   Dg Chest Port 1v Same Day  Result Date: 04/28/2017 CLINICAL DATA:  Pneumonia. Shortness of breath. Dizziness on exertion. EXAM: PORTABLE CHEST 1 VIEW COMPARISON:  04/24/2017. FINDINGS: Mediastinum and hilar structures normal. Heart size stable. No pulmonary venous congestion. Low lung volumes with mild basilar atelectasis. No pleural effusion or pneumothorax. Degenerative change thoracic spine. Thoracic scoliosis. IMPRESSION: Low lung volumes with mild basilar atelectasis, otherwise negative chest. Chest is stable from 04/24/2017. Electronically Signed   By: Marcello Moores  Register   On: 04/28/2017 12:58     ASSESSMENT AND PLAN   62 y.o.femalewith HTN, diabetes mellitus type 2, OSA not on CPAP, history of positive stress test in 2008 with normal coronaries on cardiac cath, obesity, arthritis, hyperlipidemia admitted to cardiology service for NST EMI on 4/8. Post cardiac cath found to be lethargic and CT head shows acute/subacute left PICA stroke. On DAPT with ASA and Brilinta.     Acute/Subacute Left Cerebellar infarct    Risk factors: DM, HTN, OSA, CAD  Etiology: ? Post procedural, will need monitor for pAfib  Recommend # MRI of the brain without  contrast #CTA head and neck  #Transthoracic Echo  # OK to continue ASA and Brilinta as it is needed given recent DES placement, also no petechial hemorrhage noted #Start or continue Atorvastatin 80 mg/other high intensity statin # BP goal: permissive HTN upto 419//622 systolic  # HBAIC and Lipid profile # Telemetry monitoring # Frequent neuro checks # NPO until passes stroke swallow screen   Mild cerebral edema with minimal 4th ventricle compression without development of hydrocephalus  Given location of stroke will need to monitor for cerebral edema causing hydrocephalus  Currently does not need 3% hypertonic     Please page stroke NP  Or  PA  Or MD from 8am -4 pm  as this patient from this time will be  followed by the stroke.   You can look them up on [email or web address]  Password [facility]   Bennet Kujawa [facility] Pager Number [phone]

## 2017-04-29 NOTE — Progress Notes (Signed)
Admitted to 3W23 via bed.  Denies pain,  A&Ox4, oriented to unit, plan of care & safety precautions.

## 2017-04-29 NOTE — Progress Notes (Signed)
Inpatient Diabetes Program Recommendations  AACE/ADA: New Consensus Statement on Inpatient Glycemic Control (2015)  Target Ranges:  Prepandial:   less than 140 mg/dL      Peak postprandial:   less than 180 mg/dL (1-2 hours)      Critically ill patients:  140 - 180 mg/dL   Lab Results  Component Value Date   GLUCAP 286 (H) 04/29/2017   HGBA1C 13.3 (H) 04/29/2017    Review of Glycemic ControlResults for TAMEIKA, HECKMANN (MRN [id number]) as of 04/29/2017 10:10  Ref. Range 04/28/2017 06:47 04/28/2017 12:37 04/28/2017 16:50 04/28/2017 21:20 04/29/2017 06:34  Glucose-Capillary Latest Ref Range: 65 - 99 mg/dL 245 (H) 283 (H) 321 (H) 294 (H) 286 (H)   Diabetes history: Type 2 DM Outpatient Diabetes medications: Lantus 50 units QAM, Novolog 20 units TID, Trulicity .75 mg Q wk Current orders for Inpatient glycemic control: Lantus 25 units QHS, Novolog 0-5 units QHS, Novolog 0-15 units TID Inpatient Diabetes Program Recommendations:    Note A1C indicates very poor control of DM although is slightly lower then A1C from July 2018.  Please increase Lantus to 40 units.  Also consider adding Novolog meal coverage 10 units tid with meals.  Unclear if patient is actually taking medications as ordered based on A1C.  Will attempt speaking with patient today.    Thanks,  Adah Perl, RN, BC-ADM Inpatient Diabetes Coordinator Pager [phone]

## 2017-04-29 NOTE — Progress Notes (Signed)
32 Noted pt had CVA . Will follow PT progress. Graylon Good RN BSN 04/29/2017 1:06 PM

## 2017-04-29 NOTE — Progress Notes (Signed)
Patient ID: Holly Acosta, female   DOB: 04-06-1955, 62 y.o.   MRN: [id number]                                                                PROGRESS NOTE                                                                                                                                                                                                             Patient Demographics:    Holly Acosta, is a 62 y.o. female, DOB - 1955/06/30, CXK:[id number]  Admit date - 04/24/2017   Admitting Physician Lelon Perla, MD  Outpatient Primary MD for the patient is Mayo, Pete Pelt, MD  LOS - 4  Outpatient Specialists:     Chief Complaint  Patient presents with  . Chest Pain       Brief Narrative      62 y.o.femalewith a diabetes mellitus type 2, OSA not on CPAP, history of positive stress test in 2008 with normal coronaries on cardiac cath, obesity, arthritis, hypertension and hyperlipidemia who is admitted to cardiology service for NST EMI on 4/8. Patient initially presented with acute onset of substernal chest pain radiating to both arms while watching TV. She was found to have T wave inversion in an LDH elevated troponin. Patient underwent left heart catheterization on 4/8 with finding of 99% stenosed proximal circumflex lesion followed by DES and started on DA PT with aspirin and Brilinta. Since the procedure she has been since the recovery from the procedure she has been found to be increasingly sleepy. Patient was unable to participate with cardiac rehab this morning. Initially thought this was following anesthesia prolonged recovery from ane she continues to be lethargic. Vitals have been stable afebrile. Labs early this morning was unremarkable except for mild leukocytosis (13 K) and elevated blood glucose in 200s. Chest x-ray done this afternoon was negative for infiltrate. Planneddischarge home was canceled by primary team.She has been nauseous since her cardiac cath and has vomited 4  times (twice yesterday and twice today).  Hospitalist consulted for further evaluation.  On my evaluation patient is sleepy, opens eyes spontaneously and answering question appropriately but again appears sleepy.Patient denies any headache, blurred vision, dizziness, fevers, chills, further chest pain, shortness of breath, abdominal pain, dysuria,  diarrhea, tingling or numbness of her extremities. She denies any recent illness, new medications, use of narcotics or benzodiazepines. Has not received any pain medications benzodiazepines today.      Subjective:    Holly Acosta today is axox2 (person, place,  ? Not time vs sleepiness )  Pt seems more alert than described in priornotes. CT brain overnite showed CVA. W/up pending.   No headache, No chest pain, No abdominal pain - No Nausea, No new weakness tingling or numbness, No Cough - SOB.    Assessment  & Plan :    Principal Problem:   Non-ST elevation (NSTEMI) myocardial infarction [facility]) Active Problems:   DM (diabetes mellitus), type 2, uncontrolled (HCC)   HYPERCHOLESTEROLEMIA   HYPERTENSION, BENIGN ESSENTIAL   Chest pain   Altered Mental Status Acute Stroke CT brain 4/9=> IMPRESSION: Acute to early subacute left posterior inferior cerebellar artery territory infarct without hemorrhage. Mild mass effect on the fourth ventricle, but no hydrocephalus. MRI brain 4/10=> IMPRESSION: 1. 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 Associated mild petechial hemorrhage without frank hemorrhagic transformation. Localized edema with mild mass effect on the fourth ventricular outflow tract which remains patent at this time. No hydrocephalus. 2. Additional 8 mm acute ischemic nonhemorrhagic right cerebellar infarct. 3. Moderate nonspecific cerebral white matter changes, most likely related to chronic small vessel ischemic disease.  Cardiac echo pending CTA brain, neck ordered Cont Aspirin Cont  Brillinta Cont Crestor 40mg  po qhs  Non-ST elevation (NSTEMI) myocardial infarction [facility]) Management per primary. Cardiac cath with DES to ostial circumflex.  Cont Aspirin Cont Brillinta Cont Lisinopril 40mg  po qday Cont Carvedilol l12.5 g po bid Cont Norvasc 10mg  po qday  Nausea and vomiting resolved Abdominal exam benign.   DM (diabetes mellitus), type 2, uncontrolled (HCC) Cont Lantus 25 units [place] qday, OFF Metformin due to recent cath fsbs ac and qhs, ISS  Essential hypertension Permissive hypertension as per neurology  OSA Not on CPAP.checkABG.  Code Status : FULL CODE  Family Communication  : w patient  Disposition Plan  : TBD  Barriers For Discharge :   Consults  :  Neurology , cardiology  Procedures  :   Cardiac catheterization, CT brain 4/9, MRI brain 4/10  DVT Prophylaxis  :  - SCDs currently, defer to cardiology, neurology  Lab Results  Component Value Date   PLT 266 04/28/2017    Antibiotics  :  none  Anti-infectives (From admission, onward)   None        Objective:   Vitals:   04/28/17 2343 04/29/17 0022 04/29/17 0224 04/29/17 0415  BP: (!) 161/82 (!) 143/67 139/84 134/82  Pulse: 67 79 70 75  Resp: 16 16 16 17   Temp: 98.1 F (36.7 C) 98.7 F (37.1 C) 98.7 F (37.1 C) 98.6 F (37 C)  TempSrc: Oral Oral Oral Oral  SpO2: 94% 93% 94% 95%  Weight:      Height:        Wt Readings from Last 3 Encounters:  04/28/17 83 kg (182 lb 15.7 oz)  07/31/16 87.1 kg (192 lb)  05/08/16 87.5 kg (193 lb)     Intake/Output Summary (Last 24 hours) at 04/29/2017 0624 Last data filed at 04/29/2017 0500 Gross per 24 hour  Intake 600 ml  Output 1500 ml  Net -900 ml     Physical Exam  Awake Alert, Oriented X 3, No new F.N deficits, Normal affect [place].AT,PERRAL Supple Neck,No JVD, No cervical lymphadenopathy appriciated.  Symmetrical Chest wall movement, Good air movement bilaterally, CTAB RRR,No Gallops,Rubs or new Murmurs, No Parasternal  Heave +ve B.Sounds, Abd Soft, No tenderness, No organomegaly appriciated, No rebound - guarding or rigidity. No Cyanosis, Clubbing or edema, No new Rash or bruise   Good finger to nose,  downgoing toe on the right, equivocal on the left    Data Review:    CBC Recent Labs  Lab 04/24/17 0023 04/25/17 0258 04/26/17 0406 04/27/17 0326 04/28/17 0246  WBC 8.9 10.4 10.5 10.7* 13.3*  HGB 13.7 13.8 13.4 12.9 13.9  HCT 42.0 43.0 42.1 40.2 42.7  PLT 245 263 239 238 266  MCV 87.7 87.0 87.3 87.8 87.5  MCH 28.6 27.9 27.8 28.2 28.5  MCHC 32.6 32.1 31.8 32.1 32.6  RDW 13.9 14.3 14.1 14.2 14.2    Chemistries  Recent Labs  Lab 04/24/17 0023 04/25/17 0258 04/26/17 0406 04/28/17 0246 04/28/17 1634  NA 133* 133* 134* 135 134*  K 4.0 3.8 4.3 4.5 4.1  CL 98* 98* 101 102 100*  CO2 23 23 21* 22 24  GLUCOSE 231* 283* 317* 272* 280*  BUN 21* 14 15 14 16   CREATININE 0.98 0.97 0.96 0.94 0.98  CALCIUM 9.5 9.2 9.3 9.4 9.3   ------------------------------------------------------------------------------------------------------------------ No results for input(s): CHOL, HDL, LDLCALC, TRIG, CHOLHDL, LDLDIRECT in the last 72 hours.  Lab Results  Component Value Date   HGBA1C 13.7 07/31/2016   ------------------------------------------------------------------------------------------------------------------ Recent Labs    04/28/17 1634  TSH 0.422   ------------------------------------------------------------------------------------------------------------------ No results for input(s): VITAMINB12, FOLATE, FERRITIN, TIBC, IRON, RETICCTPCT in the last 72 hours.  Coagulation profile Recent Labs  Lab 04/25/17 0258  INR 1.02    No results for input(s): DDIMER in the last 72 hours.  Cardiac Enzymes Recent Labs  Lab 04/24/17 1253 04/24/17 1610 04/25/17 0058  TROPONINI 0.83* 1.83* >65.00*    ------------------------------------------------------------------------------------------------------------------    Component Value Date/Time   BNP 31.0 04/24/2017 0901    Inpatient Medications  Scheduled Meds: . amLODipine  10 mg Oral Daily  . aspirin EC  81 mg Oral Daily  . carvedilol  12.5 mg Oral BID WC  . insulin aspart  0-15 Units Subcutaneous TID WC  . insulin aspart  0-5 Units Subcutaneous QHS  . insulin glargine  25 Units Subcutaneous Q2200  . lisinopril  40 mg Oral Daily  . rosuvastatin  40 mg Oral Daily  . ticagrelor  90 mg Oral BID   Continuous Infusions: . sodium chloride     PRN Meds:.sodium chloride, acetaminophen, acetaminophen, magnesium hydroxide, ondansetron (ZOFRAN) IV, sodium chloride flush  Micro Results No results found for this or any previous visit (from the past 240 hour(s)).  Radiology Reports Dg Chest 2 View  Result Date: 04/24/2017 CLINICAL DATA:  Central chest pain for 1 day radiating to the arms. EXAM: CHEST - 2 VIEW COMPARISON:  03/05/2009 FINDINGS: Shallow inspiration with probable linear atelectasis in the lung bases. No airspace disease or consolidation. No blunting of costophrenic angles. No pneumothorax. Mediastinal contours appear intact. Heart size and pulmonary vascularity are normal. Degenerative changes in the spine. IMPRESSION: Shallow inspiration with mild linear atelectasis in the lung bases. Electronically Signed   By: Lucienne Capers M.D.   On: 04/24/2017 01:31   Ct Head Wo Contrast  Result Date: 04/28/2017 CLINICAL DATA:  Altered mental status EXAM: CT HEAD WITHOUT CONTRAST TECHNIQUE: Contiguous axial images were obtained from the base of the skull through the vertex without intravenous contrast. COMPARISON:  None. FINDINGS: Brain: There  is hypoattenuation within the medial left cerebellar hemisphere consistent with acute to early subacute infarct. There is mass effect on the fourth ventricle. No hydrocephalus. No intracranial  hemorrhage or extra-axial collection. Bilateral basal ganglia mineralization. Otherwise normal appearance of the brain for age. Vascular: Atherosclerotic calcification of the internal carotid arteries at the skull base. No hyperdense vessel. Skull: Normal visualized skull base, calvarium and extracranial soft tissues. Sinuses/Orbits: No sinus fluid levels or advanced mucosal thickening. No mastoid effusion. Normal orbits. IMPRESSION: Acute to early subacute left posterior inferior cerebellar artery territory infarct without hemorrhage. Mild mass effect on the fourth ventricle, but no hydrocephalus. These results will be called to the ordering clinician or representative by the Radiologist Assistant, and communication documented in the PACS or zVision Dashboard. Electronically Signed   By: Ulyses Jarred M.D.   On: 04/28/2017 20:00   Mr Brain Wo Contrast  Result Date: 04/29/2017 CLINICAL DATA:  Initial evaluation for acute stroke. EXAM: MRI HEAD WITHOUT CONTRAST TECHNIQUE: Multiplanar, multiecho pulse sequences of the brain and surrounding structures were obtained without intravenous contrast. COMPARISON:  Prior CT from 04/28/2017. FINDINGS: Brain: Generalized age-related cerebral atrophy. Patchy and confluent T2/FLAIR hyperintensity within the periventricular, deep, and subcortical white matter both cerebral hemispheres, nonspecific, but most like related to chronic small vessel ischemic changes, moderate nature. There is an acute moderate size left PICA territory infarct involving the inferomedial left cerebellar hemisphere. Mild petechial hemorrhage without frank hemorrhagic transformation. Localized edema with mild mass effect on the fourth ventricular outflow tract which remains patent at this time. No hydrocephalus. Additional punctate 8 mm acute ischemic infarct within the inferior right cerebellar hemisphere, right PICA territory. No associated hemorrhage or mass effect. No other evidence for acute or  subacute ischemia. Gray-white matter differentiation otherwise maintained. No other areas of chronic infarction identified. No other evidence for acute or chronic intracranial hemorrhage. No mass lesion or midline shift. No hydrocephalus. No extra-axial fluid collection. Major dural sinuses are grossly patent. Incidental note made of a partially empty sella. Midline structures intact and normal. Vascular: Major intracranial vascular flow voids are maintained at the skull base. Right vertebral artery is dominant. Skull and upper cervical spine: Craniocervical junction within normal limits. Upper cervical spine unremarkable. Bone marrow signal intensity within normal limits. No scalp soft tissue abnormality. Sinuses/Orbits: Globes and orbital soft tissues within normal limits. Paranasal sinuses are clear. Trace opacity bilateral mastoid air cells, of doubtful significance. Inner ear structures normal. Other: None. IMPRESSION: 1. 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 Associated mild petechial hemorrhage without frank hemorrhagic transformation. Localized edema with mild mass effect on the fourth ventricular outflow tract which remains patent at this time. No hydrocephalus. 2. Additional 8 mm acute ischemic nonhemorrhagic right cerebellar infarct. 3. Moderate nonspecific cerebral white matter changes, most likely related to chronic small vessel ischemic disease. Electronically Signed   By: Jeannine Boga M.D.   On: 04/29/2017 06:03   Dg Chest Port 1v Same Day  Result Date: 04/28/2017 CLINICAL DATA:  Pneumonia. Shortness of breath. Dizziness on exertion. EXAM: PORTABLE CHEST 1 VIEW COMPARISON:  04/24/2017. FINDINGS: Mediastinum and hilar structures normal. Heart size stable. No pulmonary venous congestion. Low lung volumes with mild basilar atelectasis. No pleural effusion or pneumothorax. Degenerative change thoracic spine. Thoracic scoliosis. IMPRESSION: Low lung volumes  with mild basilar atelectasis, otherwise negative chest. Chest is stable from 04/24/2017. Electronically Signed   By: Marcello Moores  Register   On: 04/28/2017 12:58    Time Spent in minutes  Lockhart M.D on 04/29/2017 at 6:24 AM  Between 7am to 7pm - Pager - [phone]    After 7pm go to [email or web address] - password [facility]  [facility] -  Office  [phone]

## 2017-04-29 NOTE — Evaluation (Signed)
Occupational Therapy Evaluation Patient Details Name: Holly Acosta MRN: [id number] DOB: January 12, 1956 Today's Date: 04/29/2017    History of Present Illness Pt is a 62 y.o. female admitted 04/24/17 with chest pain; worked up for NSTEMI. S/p L heart cath 4/8 and DES to ostial circumflex. PMH includes HTN, DM, sleep apnea, bilat knee arthritis.   Clinical Impression   Pt with decline in function and safety with ADLs and ADL mobility wirh decreased strength, balance endurance and cognition/poor safety awareness. Pt with significant lethargy, requiring max multimodal cues to participate. Had eyes closed majority of session, even while standing Opened eyes upon cues to ambulate x 5 ft to sink and back to bed. Pt would benefit from acute OT services to address impairments to maximize level of function and safety    Follow Up Recommendations  SNF;Supervision/Assistance - 24 hour;Other (comment)(HH with 24 hour sup and assist if family can provide)    Equipment Recommendations   TBD at next venue of care   Recommendations for Other Services       Precautions / Restrictions Precautions Precautions: Fall Restrictions Weight Bearing Restrictions: Yes      Mobility Bed Mobility Overal bed mobility: Needs Assistance Bed Mobility: Supine to Sit;Sit to Supine     Supine to sit: Min guard;HOB elevated Sit to supine: Min guard   General bed mobility comments: Significant increased time and effort, requiring cues to stay awake and step by step sequencing. Able to scoot to [facility] with increased time  Transfers Overall transfer level: Needs assistance Equipment used: Rolling walker (2 wheeled) Transfers: Sit to/from Stand Sit to Stand: Min guard         General transfer comment: poor balance, eyes closed until cues from OT to ambulate towards sink with RW    Balance Overall balance assessment: Needs assistance Sitting-balance support: Bilateral upper extremity supported;Feet  supported Sitting balance-Leahy Scale: Fair     Standing balance support: Single extremity supported;During functional activity Standing balance-Leahy Scale: Poor                             ADL either performed or assessed with clinical judgement   ADL Overall ADL's : Needs assistance/impaired     Grooming: Wash/dry hands;Wash/dry face;Min guard;Standing   Upper Body Bathing: Min guard;Sitting   Lower Body Bathing: Moderate assistance   Upper Body Dressing : Min guard   Lower Body Dressing: Moderate assistance   Toilet Transfer: Min guard;Stand-pivot;Ambulation Toilet Transfer Details (indicate cue type and reason): simulated to recliner Toileting- Clothing Manipulation and Hygiene: Minimal assistance;Sit to/from stand       Functional mobility during ADLs: Min guard;Cueing for safety;Cueing for sequencing;Rolling walker General ADL Comments: Pt with significant lethargy, requiring max multimodal cues to participate. Had eyes closed majority of session, even while standing Opened eyes upon cues to ambulate x 5 ft to sink and back to bed     Vision Baseline Vision/History: Wears glasses Wears Glasses: Reading only Patient Visual Report: No change from baseline       Perception     Praxis      Pertinent Vitals/Pain Pain Assessment: No/denies pain     Hand Dominance Right   Extremity/Trunk Assessment Upper Extremity Assessment Upper Extremity Assessment: Generalized weakness   Lower Extremity Assessment Lower Extremity Assessment: Defer to PT evaluation   Cervical / Trunk Assessment Cervical / Trunk Assessment: Normal   Communication Communication Communication: Expressive difficulties   Cognition Arousal/Alertness: Lethargic;Suspect  due to medications Behavior During Therapy: Flat affect Overall Cognitive Status: Impaired/Different from baseline Area of Impairment: Attention;Following commands;Safety/judgement;Awareness;Problem solving                    Current Attention Level: Focused   Following Commands: Follows one step commands inconsistently;Follows one step commands with increased time Safety/Judgement: Decreased awareness of safety;Decreased awareness of deficits Awareness: Emergent Problem Solving: Decreased initiation;Slow processing;Difficulty sequencing;Requires verbal cues General Comments: Pt with significant lethargy, requiring max multimodal cues to participate. Had eyes closed majority of session, even while standing Opened eyes upon cues to ambulate x 5 ft to sink and back to bed   General Comments       Exercises     Shoulder Instructions      Home Living Family/patient expects to be discharged to:: Private residence Living Arrangements: Other relatives Available Help at Discharge: Family;Available PRN/intermittently Type of Home: House Home Access: Ramped entrance     Home Layout: One level     Bathroom Shower/Tub: Tub/shower unit;Walk-in shower   Bathroom Toilet: Standard     Home Equipment: Walker - 2 wheels          Prior Functioning/Environment Level of Independence: Independent                 OT Problem List: Decreased strength;Decreased activity tolerance;Decreased knowledge of use of DME or AE;Impaired balance (sitting and/or standing);Decreased safety awareness      OT Treatment/Interventions:      OT Goals(Current goals can be found in the care plan section) Acute Rehab OT Goals Patient Stated Goal: get better OT Goal Formulation: With patient Time For Goal Achievement: 05/13/17 Potential to Achieve Goals: Good ADL Goals Pt Will Perform Grooming: with supervision;with set-up;standing Pt Will Perform Upper Body Bathing: with supervision;with set-up;sitting Pt Will Perform Upper Body Dressing: with supervision;with set-up;sitting Pt Will Transfer to Toilet: with supervision;ambulating;regular height toilet;grab bars Pt Will Perform Toileting - Clothing  Manipulation and hygiene: with min guard assist;sit to/from stand  OT Frequency: Min 2X/week   Barriers to D/C: Decreased caregiver support  uncertain of levelof care and assist pt would be able to have at home       Co-evaluation              AM-PAC PT "6 Clicks" Daily Activity     Outcome Measure Help from another person eating meals?: A Little Help from another person taking care of personal grooming?: A Little Help from another person toileting, which includes using toliet, bedpan, or urinal?: A Lot Help from another person bathing (including washing, rinsing, drying)?: A Lot Help from another person to put on and taking off regular upper body clothing?: A Little Help from another person to put on and taking off regular lower body clothing?: A Lot 6 Click Score: 15   End of Session Equipment Utilized During Treatment: Gait belt  Activity Tolerance: Patient limited by fatigue Patient left: in bed;with call bell/phone within reach;with bed alarm set  OT Visit Diagnosis: Other abnormalities of gait and mobility (R26.89);Muscle weakness (generalized) (M62.81)                Time: 6712-4580 OT Time Calculation (min): 23 min Charges:  OT General Charges $OT Visit: 1 Visit OT Evaluation $OT Eval Moderate Complexity: 1 Mod OT Treatments $Therapeutic Activity: 8-22 mins G-Codes: OT G-codes **NOT FOR INPATIENT CLASS** Functional Assessment Tool Used: AM-PAC 6 Clicks Daily Activity     Britt Bottom 04/29/2017, 11:18 AM

## 2017-04-29 NOTE — Progress Notes (Addendum)
Progress Note  Patient Name: Holly Acosta Date of Encounter: 04/29/2017  Primary Cardiologist: Nasean Zapf [place], MD   Subjective   Pt awake and answering questions. She feels better than yesterday.  Inpatient Medications    Scheduled Meds: . amLODipine  10 mg Oral Daily  . aspirin EC  81 mg Oral Daily  . carvedilol  12.5 mg Oral BID WC  . insulin aspart  0-15 Units Subcutaneous TID WC  . insulin aspart  0-5 Units Subcutaneous QHS  . insulin glargine  25 Units Subcutaneous Q2200  . lisinopril  40 mg Oral Daily  . rosuvastatin  40 mg Oral Daily  . ticagrelor  90 mg Oral BID   Continuous Infusions: . sodium chloride     PRN Meds: sodium chloride, acetaminophen, acetaminophen, magnesium hydroxide, ondansetron (ZOFRAN) IV, sodium chloride flush   Vital Signs    Vitals:   04/29/17 0224 04/29/17 0415 04/29/17 0620 04/29/17 0637  BP: 139/84 134/82 (!) 146/84   Pulse: 70 75 64   Resp: 16 17 16    Temp: 98.7 F (37.1 C) 98.6 F (37 C) 98.7 F (37.1 C)   TempSrc: Oral Oral Oral   SpO2: 94% 95% 98%   Weight:    187 lb 9.8 oz (85.1 kg)  Height:        Intake/Output Summary (Last 24 hours) at 04/29/2017 0748 Last data filed at 04/29/2017 0500 Gross per 24 hour  Intake 600 ml  Output 1300 ml  Net -700 ml   Filed Weights   04/27/17 0332 04/28/17 0221 04/29/17 0637  Weight: 183 lb 12.8 oz (83.4 kg) 182 lb 15.7 oz (83 kg) 187 lb 9.8 oz (85.1 kg)    Telemetry    sinus - Personally Reviewed  ECG    No new tracings - Personally Reviewed  Physical Exam   GEN: No acute distress.   Neck: No JVD Cardiac: RRR, no murmurs, rubs, or gallops.  Respiratory: Clear to auscultation bilaterally. GI: Soft, nontender, non-distended  MS: No edema; No deformity. Neuro:  Nonfocal  Psych: Normal affect   Labs    Chemistry Recent Labs  Lab 04/26/17 0406 04/28/17 0246 04/28/17 1634  NA 134* 135 134*  K 4.3 4.5 4.1  CL 101 102 100*  CO2 21* 22 24  GLUCOSE 317* 272* 280*    BUN 15 14 16   CREATININE 0.96 0.94 0.98  CALCIUM 9.3 9.4 9.3  GFRNONAA >60 >60 >60  GFRAA >60 >60 >60  ANIONGAP 12 11 10      Hematology Recent Labs  Lab 04/27/17 0326 04/28/17 0246 04/29/17 0557  WBC 10.7* 13.3* 13.9*  RBC 4.58 4.88 4.70  HGB 12.9 13.9 13.2  HCT 40.2 42.7 41.0  MCV 87.8 87.5 87.2  MCH 28.2 28.5 28.1  MCHC 32.1 32.6 32.2  RDW 14.2 14.2 14.1  PLT 238 266 284    Cardiac Enzymes Recent Labs  Lab 04/24/17 1253 04/24/17 1610 04/25/17 0058  TROPONINI 0.83* 1.83* >65.00*    Recent Labs  Lab 04/24/17 0027 04/24/17 0907  TROPIPOC 0.00 0.08     BNP Recent Labs  Lab 04/24/17 0901  BNP 31.0     DDimer  Recent Labs  Lab 04/24/17 0901  DDIMER <0.27     Radiology    Ct Head Wo Contrast  Result Date: 04/28/2017 CLINICAL DATA:  Altered mental status EXAM: CT HEAD WITHOUT CONTRAST TECHNIQUE: Contiguous axial images were obtained from the base of the skull through the vertex without intravenous contrast. COMPARISON:  None. FINDINGS: Brain: There is hypoattenuation within the medial left cerebellar hemisphere consistent with acute to early subacute infarct. There is mass effect on the fourth ventricle. No hydrocephalus. No intracranial hemorrhage or extra-axial collection. Bilateral basal ganglia mineralization. Otherwise normal appearance of the brain for age. Vascular: Atherosclerotic calcification of the internal carotid arteries at the skull base. No hyperdense vessel. Skull: Normal visualized skull base, calvarium and extracranial soft tissues. Sinuses/Orbits: No sinus fluid levels or advanced mucosal thickening. No mastoid effusion. Normal orbits. IMPRESSION: Acute to early subacute left posterior inferior cerebellar artery territory infarct without hemorrhage. Mild mass effect on the fourth ventricle, but no hydrocephalus. These results will be called to the ordering clinician or representative by the Radiologist Assistant, and communication documented in  the PACS or zVision Dashboard. Electronically Signed   By: Ulyses Jarred M.D.   On: 04/28/2017 20:00   Mr Brain Wo Contrast  Result Date: 04/29/2017 CLINICAL DATA:  Initial evaluation for acute stroke. EXAM: MRI HEAD WITHOUT CONTRAST TECHNIQUE: Multiplanar, multiecho pulse sequences of the brain and surrounding structures were obtained without intravenous contrast. COMPARISON:  Prior CT from 04/28/2017. FINDINGS: Brain: Generalized age-related cerebral atrophy. Patchy and confluent T2/FLAIR hyperintensity within the periventricular, deep, and subcortical white matter both cerebral hemispheres, nonspecific, but most like related to chronic small vessel ischemic changes, moderate nature. There is an acute moderate size left PICA territory infarct involving the inferomedial left cerebellar hemisphere. Mild petechial hemorrhage without frank hemorrhagic transformation. Localized edema with mild mass effect on the fourth ventricular outflow tract which remains patent at this time. No hydrocephalus. Additional punctate 8 mm acute ischemic infarct within the inferior right cerebellar hemisphere, right PICA territory. No associated hemorrhage or mass effect. No other evidence for acute or subacute ischemia. Gray-white matter differentiation otherwise maintained. No other areas of chronic infarction identified. No other evidence for acute or chronic intracranial hemorrhage. No mass lesion or midline shift. No hydrocephalus. No extra-axial fluid collection. Major dural sinuses are grossly patent. Incidental note made of a partially empty sella. Midline structures intact and normal. Vascular: Major intracranial vascular flow voids are maintained at the skull base. Right vertebral artery is dominant. Skull and upper cervical spine: Craniocervical junction within normal limits. Upper cervical spine unremarkable. Bone marrow signal intensity within normal limits. No scalp soft tissue abnormality. Sinuses/Orbits: Globes and  orbital soft tissues within normal limits. Paranasal sinuses are clear. Trace opacity bilateral mastoid air cells, of doubtful significance. Inner ear structures normal. Other: None. IMPRESSION: 1. 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 Associated mild petechial hemorrhage without frank hemorrhagic transformation. Localized edema with mild mass effect on the fourth ventricular outflow tract which remains patent at this time. No hydrocephalus. 2. Additional 8 mm acute ischemic nonhemorrhagic right cerebellar infarct. 3. Moderate nonspecific cerebral white matter changes, most likely related to chronic small vessel ischemic disease. Electronically Signed   By: Jeannine Boga M.D.   On: 04/29/2017 06:03   Dg Chest Port 1v Same Day  Result Date: 04/28/2017 CLINICAL DATA:  Pneumonia. Shortness of breath. Dizziness on exertion. EXAM: PORTABLE CHEST 1 VIEW COMPARISON:  04/24/2017. FINDINGS: Mediastinum and hilar structures normal. Heart size stable. No pulmonary venous congestion. Low lung volumes with mild basilar atelectasis. No pleural effusion or pneumothorax. Degenerative change thoracic spine. Thoracic scoliosis. IMPRESSION: Low lung volumes with mild basilar atelectasis, otherwise negative chest. Chest is stable from 04/24/2017. Electronically Signed   By: Marcello Moores  Register   On: 04/28/2017 12:58    Cardiac  Studies   Left heart cath 04/27/17:  Prox RCA lesion is 25% stenosed.  Mid RCA to Dist RCA lesion is 25% stenosed.  Prox LAD lesion is 25% stenosed.  The left ventricular systolic function is normal.  LV end diastolic pressure is mildly elevated. LVEDP 18 mm Hg.  The left ventricular ejection fraction is 55-65% by visual estimate.  There is no aortic valve stenosis.  Prox Cx lesion is 99% stenosed. This was the culprit lesion.  A drug-eluting stent was successfully placed using a STENT SIERRA 3.50 X 18 MM.  Post intervention, there is a 0%  residual stenosis.  Continue dual antiplatelet therapy for at least one year with aggressive secondary prevention.  I stressed the importance of compliance with her Brilinta.    Echo 04/25/17: Study Conclusions - Left ventricle: The cavity size was normal. Wall thickness was increased in a pattern of moderate LVH. There was focal basal hypertrophy. Systolic function was normal. The estimated ejection fraction was in the range of 55% to 60%. Wall motion was normal; there were no regional wall motion abnormalities. Features are consistent with a pseudonormal left ventricular filling pattern, with concomitant abnormal relaxation and increased filling pressure (grade 2 diastolic dysfunction).   Patient Profile     62 y.o. female with a PMH HTN, HLD, DM type 2, OSA, and arthritis, who presents with CP. DES to proximal Cx. Subsequently found to have altered mental status; head CT with acute stroke.   Assessment & Plan    1. NSTEMI Heart catheterization with DES to proximal Cx. On ASA and brilinta. Continue statin. No chest pain.    2. Lethargy, altered mental status, leukocytosis Medicine consulted yesterday for sleepiness, lethargy. She did not have focal neurological deficits.  Head CT with evidence of acute and subacute left PICA infarct. MRI brain with moderate sized acute ischemic left cerebellar infarct without frank hemorrhagic transformation. Awaiting CT angio. UA negative for UTI, CXR without signs of acute infection. Leukocytosis may be in response to stroke. Management per neurology.    3. HTN Permissive BP per neuro. Pressures 130-160s. No medication changes today.   4. DM Continue insulin for tight BG control. Repeat A1c 13.3%.    For questions or updates, please contact [place] Please consult [email or web address] for contact info under Cardiology/STEMI.      Signed, Tami Lin Duke, PA  04/29/2017, 7:48 AM    Patient examined chart  reviewed. Lethargy yesterday prompted head CT post cath which showed CVA. Needs to continue DAT given recent DES. BP is ok no signs of infection. No chest pain or arrhythmia on telemetry Appreciate neurology and IM care May end up needed inpatient rehab. Headache better Lungs clear right radial cath sight A   Jenkins Rouge

## 2017-04-29 NOTE — Progress Notes (Signed)
Inpatient Diabetes Program Recommendations  AACE/ADA: New Consensus Statement on Inpatient Glycemic Control (2015)  Target Ranges:  Prepandial:   less than 140 mg/dL      Peak postprandial:   less than 180 mg/dL (1-2 hours)      Critically ill patients:  140 - 180 mg/dL   Lab Results  Component Value Date   GLUCAP 284 (H) 04/29/2017   HGBA1C 13.3 (H) 04/29/2017   Diabetes history:Type 2 DM Outpatient Diabetes medications:Lantus 50 units QAM, Novolog 20 units TID, Trulicity .75 mg Q wk Current orders for Inpatient glycemic control:Lantus 25 units QHS, Novolog 0-5 units QHS, Novolog 0-15 units TID Inpatient Diabetes Program Recommendations:    Note A1C indicates very poor control of DM although is slightly lower then A1C from July 2018.  Please increase Lantus to 40 units.  Also consider adding Novolog meal coverage 10 units tid with meals.  Unclear if patient is actually taking medications as ordered based on A1C. Review of Glycemic Control  Spoke with patient and granddaughter regarding DM management. Patient very sleepy during conversation.  I asked if she is consistently taking insulin.  Patient shook head yes, but granddaughter said "No" that she is missing doses.  Granddaughter states that she goes to school during the day but will be able to remind patient of taking medications in the evening.  May want to move Lantus dose to evening time so that family can help remind patient once she does go home?  We discussed A1C result and what it means including importance of controlling DM.  Patient again kept eyes closed and was not engaged.  Will order Living Well with DM booklet for patient and family.  Will need close follow-up with PCP.  Note current plans for rehab prior to home.    Thanks,  Adah Perl, RN, BC-ADM Inpatient Diabetes Coordinator Pager [phone] ([phone])

## 2017-04-29 NOTE — Progress Notes (Addendum)
STROKE TEAM PROGRESS NOTE   SUBJECTIVE (INTERVAL HISTORY) Her RN is at the bedside.  Patient is a deep sleeper, took effort to wake her, but when I did she as A&O x3. No complaints. Feels as good if not better than yesterday.   OBJECTIVE Vitals:   04/29/17 0637 04/29/17 0821 04/29/17 1020 04/29/17 1301  BP:  (!) 144/81 (!) 150/71 (!) 141/79  Pulse:  72 69 70  Resp:    18  Temp:    98 F (36.7 C)  TempSrc:    Oral  SpO2:  99% 99% 96%  Weight: 85.1 kg (187 lb 9.8 oz)     Height:        CBC:  Recent Labs  Lab 04/28/17 0246 04/29/17 0557  WBC 13.3* 13.9*  HGB 13.9 13.2  HCT 42.7 41.0  MCV 87.5 87.2  PLT 266 409    Basic Metabolic Panel:  Recent Labs  Lab 04/28/17 0246 04/28/17 1634  NA 135 134*  K 4.5 4.1  CL 102 100*  CO2 22 24  GLUCOSE 272* 280*  BUN 14 16  CREATININE 0.94 0.98  CALCIUM 9.4 9.3    Lipid Panel:     Component Value Date/Time   CHOL 173 04/29/2017 0557   TRIG 159 (H) 04/29/2017 0557   HDL 34 (L) 04/29/2017 0557   CHOLHDL 5.1 04/29/2017 0557   VLDL 32 04/29/2017 0557   LDLCALC 107 (H) 04/29/2017 0557   HgbA1c:  Lab Results  Component Value Date   HGBA1C 13.3 (H) 04/29/2017   Urine Drug Screen: No results found for: LABOPIA, COCAINSCRNUR, LABBENZ, AMPHETMU, THCU, LABBARB  Alcohol Level No results found for: ETH  IMAGING  Ct Angio Head W Or Wo Contrast  Result Date: 04/29/2017 CLINICAL DATA:  Stroke follow-up EXAM: CT ANGIOGRAPHY HEAD AND NECK TECHNIQUE: Multidetector CT imaging of the head and neck was performed using the standard protocol during bolus administration of intravenous contrast. Multiplanar CT image reconstructions and MIPs were obtained to evaluate the vascular anatomy. Carotid stenosis measurements (when applicable) are obtained utilizing NASCET criteria, using the distal internal carotid diameter as the denominator. CONTRAST:  54mL ISOVUE-370 IOPAMIDOL (ISOVUE-370) INJECTION 76% COMPARISON:  Brain MRI from earlier today.  FINDINGS: CTA NECK FINDINGS Aortic arch: Atheromatous wall calcification. Low-density along the arch and proximal descending segment is attributed to atelectasis and non-opacified vein. Two vessel branching pattern. Right carotid system: Tortuosity without stenosis, dissection, or beading. Mild calcified plaque the proximal ECA. Left carotid system: Common carotid tortuosity. Mild atheromatous plaque on the distal common carotid and at the bifurcation. No stenosis, beading, or dissection. Vertebral arteries: No proximal subclavian stenosis. There is subclavian atherosclerosis without ulceration. Mild right vertebral artery dominance. Both vertebral arteries are smooth and widely patent to the dura. Skeleton: Cervical facet spurring.  No acute or aggressive finding. Other neck: Small thyroid nodules, none measuring over 1 cm, below size threshold for strict imaging follow-up per consensus guidelines. Upper chest: Negative Review of the MIP images confirms the above findings CTA HEAD FINDINGS Anterior circulation: Calcified plaque on the carotid siphons without flow limiting stenosis. No branch occlusion, beading, or aneurysm. Posterior circulation: The mid left PICA is occluded, correlating with the acute infarct. The vertebral and basilar arteries are smooth and diffusely patent. Good flow in bilateral posterior cerebral arteries. Hypoplastic right P1 segment. Venous sinuses: Patent on the delayed phase. Anatomic variants: None significant Delayed phase: No abnormal intracranial enhancement. Review of the MIP images confirms the above findings IMPRESSION: 1.  Mid left PICA occlusion correlating with known acute infarct. No underlying embolic source or flow limiting stenosis identified. 2. Overall mild intracranial and cervical atherosclerosis. Electronically Signed   By: Monte Fantasia M.D.   On: 04/29/2017 11:35   Ct Head Wo Contrast  Result Date: 04/28/2017 CLINICAL DATA:  Altered mental status EXAM: CT HEAD  WITHOUT CONTRAST TECHNIQUE: Contiguous axial images were obtained from the base of the skull through the vertex without intravenous contrast. COMPARISON:  None. FINDINGS: Brain: There is hypoattenuation within the medial left cerebellar hemisphere consistent with acute to early subacute infarct. There is mass effect on the fourth ventricle. No hydrocephalus. No intracranial hemorrhage or extra-axial collection. Bilateral basal ganglia mineralization. Otherwise normal appearance of the brain for age. Vascular: Atherosclerotic calcification of the internal carotid arteries at the skull base. No hyperdense vessel. Skull: Normal visualized skull base, calvarium and extracranial soft tissues. Sinuses/Orbits: No sinus fluid levels or advanced mucosal thickening. No mastoid effusion. Normal orbits. IMPRESSION: Acute to early subacute left posterior inferior cerebellar artery territory infarct without hemorrhage. Mild mass effect on the fourth ventricle, but no hydrocephalus. These results will be called to the ordering clinician or representative by the Radiologist Assistant, and communication documented in the PACS or zVision Dashboard. Electronically Signed   By: Ulyses Jarred M.D.   On: 04/28/2017 20:00   Ct Angio Neck W Or Wo Contrast  Result Date: 04/29/2017 CLINICAL DATA:  Stroke follow-up EXAM: CT ANGIOGRAPHY HEAD AND NECK TECHNIQUE: Multidetector CT imaging of the head and neck was performed using the standard protocol during bolus administration of intravenous contrast. Multiplanar CT image reconstructions and MIPs were obtained to evaluate the vascular anatomy. Carotid stenosis measurements (when applicable) are obtained utilizing NASCET criteria, using the distal internal carotid diameter as the denominator. CONTRAST:  49mL ISOVUE-370 IOPAMIDOL (ISOVUE-370) INJECTION 76% COMPARISON:  Brain MRI from earlier today. FINDINGS: CTA NECK FINDINGS Aortic arch: Atheromatous wall calcification. Low-density along the  arch and proximal descending segment is attributed to atelectasis and non-opacified vein. Two vessel branching pattern. Right carotid system: Tortuosity without stenosis, dissection, or beading. Mild calcified plaque the proximal ECA. Left carotid system: Common carotid tortuosity. Mild atheromatous plaque on the distal common carotid and at the bifurcation. No stenosis, beading, or dissection. Vertebral arteries: No proximal subclavian stenosis. There is subclavian atherosclerosis without ulceration. Mild right vertebral artery dominance. Both vertebral arteries are smooth and widely patent to the dura. Skeleton: Cervical facet spurring.  No acute or aggressive finding. Other neck: Small thyroid nodules, none measuring over 1 cm, below size threshold for strict imaging follow-up per consensus guidelines. Upper chest: Negative Review of the MIP images confirms the above findings CTA HEAD FINDINGS Anterior circulation: Calcified plaque on the carotid siphons without flow limiting stenosis. No branch occlusion, beading, or aneurysm. Posterior circulation: The mid left PICA is occluded, correlating with the acute infarct. The vertebral and basilar arteries are smooth and diffusely patent. Good flow in bilateral posterior cerebral arteries. Hypoplastic right P1 segment. Venous sinuses: Patent on the delayed phase. Anatomic variants: None significant Delayed phase: No abnormal intracranial enhancement. Review of the MIP images confirms the above findings IMPRESSION: 1. Mid left PICA occlusion correlating with known acute infarct. No underlying embolic source or flow limiting stenosis identified. 2. Overall mild intracranial and cervical atherosclerosis. Electronically Signed   By: Monte Fantasia M.D.   On: 04/29/2017 11:35   Mr Brain Wo Contrast  Result Date: 04/29/2017 CLINICAL DATA:  Initial evaluation for acute stroke. EXAM: MRI  HEAD WITHOUT CONTRAST TECHNIQUE: Multiplanar, multiecho pulse sequences of the brain  and surrounding structures were obtained without intravenous contrast. COMPARISON:  Prior CT from 04/28/2017. FINDINGS: Brain: Generalized age-related cerebral atrophy. Patchy and confluent T2/FLAIR hyperintensity within the periventricular, deep, and subcortical white matter both cerebral hemispheres, nonspecific, but most like related to chronic small vessel ischemic changes, moderate nature. There is an acute moderate size left PICA territory infarct involving the inferomedial left cerebellar hemisphere. Mild petechial hemorrhage without frank hemorrhagic transformation. Localized edema with mild mass effect on the fourth ventricular outflow tract which remains patent at this time. No hydrocephalus. Additional punctate 8 mm acute ischemic infarct within the inferior right cerebellar hemisphere, right PICA territory. No associated hemorrhage or mass effect. No other evidence for acute or subacute ischemia. Gray-white matter differentiation otherwise maintained. No other areas of chronic infarction identified. No other evidence for acute or chronic intracranial hemorrhage. No mass lesion or midline shift. No hydrocephalus. No extra-axial fluid collection. Major dural sinuses are grossly patent. Incidental note made of a partially empty sella. Midline structures intact and normal. Vascular: Major intracranial vascular flow voids are maintained at the skull base. Right vertebral artery is dominant. Skull and upper cervical spine: Craniocervical junction within normal limits. Upper cervical spine unremarkable. Bone marrow signal intensity within normal limits. No scalp soft tissue abnormality. Sinuses/Orbits: Globes and orbital soft tissues within normal limits. Paranasal sinuses are clear. Trace opacity bilateral mastoid air cells, of doubtful significance. Inner ear structures normal. Other: None. IMPRESSION: 1. 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 Associated mild petechial  hemorrhage without frank hemorrhagic transformation. Localized edema with mild mass effect on the fourth ventricular outflow tract which remains patent at this time. No hydrocephalus. 2. Additional 8 mm acute ischemic nonhemorrhagic right cerebellar infarct. 3. Moderate nonspecific cerebral white matter changes, most likely related to chronic small vessel ischemic disease. Electronically Signed   By: Jeannine Boga M.D.   On: 04/29/2017 06:03   Dg Chest Port 1v Same Day  Result Date: 04/28/2017 CLINICAL DATA:  Pneumonia. Shortness of breath. Dizziness on exertion. EXAM: PORTABLE CHEST 1 VIEW COMPARISON:  04/24/2017. FINDINGS: Mediastinum and hilar structures normal. Heart size stable. No pulmonary venous congestion. Low lung volumes with mild basilar atelectasis. No pleural effusion or pneumothorax. Degenerative change thoracic spine. Thoracic scoliosis. IMPRESSION: Low lung volumes with mild basilar atelectasis, otherwise negative chest. Chest is stable from 04/24/2017. Electronically Signed   By: Marcello Moores  Register   On: 04/28/2017 12:58   2D Echocardiogram  - Left ventricle: The cavity size was normal. There was moderate concentric hypertrophy. Systolic function was normal. The estimated ejection fraction was in the range of 55% to 60%. Doppler parameters are consistent with abnormal left ventricular relaxation (grade 1 diastolic dysfunction). There was no evidence of elevated ventricular filling pressure by Doppler parameters. - Mitral valve: There was no regurgitation. Valve area by pressure half-time: 2.08 cm^2. - Left atrium: The atrium was normal in size. - Right ventricle: Systolic function was normal. - Right atrium: The atrium was normal in size. - Tricuspid valve: There was no regurgitation. - Pericardium, extracardiac: The pericardium was normal in appearance. Impressions:  There is hypokinesis of the basal inferior, inferolateral and mid inferolateral walls, overall LVEF is preserved  at 55-60%.  PHYSICAL EXAM General: Appears well-developed and well-nourished.  Psych: Affect appropriate to situation Eyes: No scleral injection HENT: No OP obstrucion Head: Normocephalic.  Cardiovascular: Normal rate and regular rhythm.  Respiratory: Effort normal and breath  sounds normal to anterior ascultation Skin: WDI  Neurological Examination Mental Status: Alert, oriented, thought content appropriate.  Speech fluent without evidence of aphasia. Mild dysarthria. Able to follow 3 step commands without difficulty. Cranial Nerves: II: Visual fields grossly normal, III,IV, VI: ptosis present in left eye , extra-ocular motions intact bilaterally, pupils equal, round, reactive to light and accommodation. No miosis noted  V,VII: smile symmetric, facial light touch sensation normal bilaterally VIII: hearing normal bilaterally IX,X: uvula rises symmetrically XI: bilateral shoulder shrug XII: midline tongue extension Motor: Right :  Upper extremity   5/5                                      Left:     Upper extremity   5/5             Lower extremity   5/5                                                  Lower extremity   5/5 Tone and bulk:normal tone throughout; no atrophy noted Sensory: Pinprick and light touch intact throughout, bilaterally Deep Tendon Reflexes: 2+ and symmetric throughout Plantars: Right: downgoing                                Left: downgoing Cerebellar:  finger-to-nose shows no significant ataxia on either arm  Gait: normal gait and station    ASSESSMENT/PLAN Holly Acosta is a 62 y.o. female with history of HTN, HLD, DB, and OSA not using her CPAP who was admitted with NSTEMI s/p cath w/ stent who was noted afterwards to be slppeing and unable to particiate with therapy. MRI showed a L cerebellar, L PICA infart.   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 Infarcts related to  current cardiac event/procedure   CT head acute L PICA infarct. No hmg. Mild mass effect on 4th vent  MRI head mod L cerebellar / L PICA infarct with mild petechial hemorrhage with localized edema and mild mass effect on the 4th ventricle.  CTA H&N  Mild L PICA occlusion. No other sign stenosis  2D Echo  EF 55-60%. No source of embolus   LDL 107  HgbA1c 13.3  SCDs for VTE prophylaxis  Diet - low sodium heart healthy  Fall precautions  Diet heart healthy/carb modified Room service appropriate? Yes; Fluid consistency: Thin  aspirin 81 mg daily and Brilinta prior to admission, now on aspirin 81 mg daily and Brilinta. Continue DAPT at discharge.  Patient counseled to be compliant with her antithrombotic medications  Ongoing aggressive stroke risk factor management  Therapy recommendations:  SNF vs home w/ HH. Family discussing  Disposition:  pending   Nothing to add from stroke standpoint. Will sign off. Follow up stroke clinic at [facility] in 4 weeks. Orders placed.  Hypertension  Stable . Permissive hypertension (OK if < 220/120) but gradually normalize in 5-7 days . Long-term BP goal normotensive  Hyperlipidemia  Home meds:  crestor 20  LDL 107, goal < 70  Now on crestor 40  Continue statin at discharge  Diabetes type II  HgbA1c 13.3,  goal < 7.0  Uncontrolled  Diabetes RN following - recommendations made  CBGs  SSI  Other Stroke Risk Factors  UDS / ETOH level not performed   Former Cigarette smoker, quit 10 yrs ago  Obesity, Body mass index is 31.22 kg/m., recommend weight loss, diet and exercise as appropriate   Coronary artery disease  obstructive sleep apnea does not use CPAP for years - recommend she wear CPAP for secondary stroke prevention. May need new mask/assessment. Asked patient to follow up   Hospital day # [place], MSN, APRN, ANVP-BC, AGPCNP-BC Advanced Practice Stroke Nurse [facility] for Schedule &  Pager information 04/29/2017 2:01 PM   ATTENDING NOTE: I reviewed above note and agree with the assessment and plan. I have made any additions or clarifications directly to the above note. Pt was seen and examined.   62 year old female with history of diabetes, hypertension, hyperlipidemia, OSA on CPAP, obesity admitted on 04/27/17 for non-STEMI status post stent, currently on aspirin and Brilinta for cardio prevention.  Postprocedure, patient found to be lethargic, drowsy and sleepy.  Had CT head showed left PICA large infarct.  MRI  confirmed left PICA infarct, no hydrocephalus.  CT head and neck unremarkable except left PICA occlusion, consistent with current infarct.  EF 55-60% with multi region hypokinesis.  A1c is 13.3 and LDL 107.   Patient mental status much improved, today awake alert fully orientated, no focal neurological deficit, no ataxia or dysmetria on exam.  Patient stroke likely due to STEMI versus cardiac procedure.  However, patient had multiple stroke risk factors including uncontrolled diabetes, CAD, hyperlipidemia, obesity and OSA.  Recommend continue aspirin and Brilinta for stroke and cardiac prevention stroke risk factor modification.  PT/OT recommended SNF.  Neurology will sign off. Please call with questions. Pt will follow up with stroke clinic NP at [facility] in about 4 weeks. Thanks for the consult.  Rosalin Hawking, MD PhD Stroke Neurology 04/29/2017 10:22 PM   To contact Stroke Continuity provider, please refer to [email or web address] After hours, contact General Neurology

## 2017-04-29 NOTE — Evaluation (Signed)
Speech Language Pathology Evaluation Patient Details Name: Holly Acosta MRN: [id number] DOB: 03/25/1955 Today's Date: 04/29/2017 Time: 1829-9371 SLP Time Calculation (min) (ACUTE ONLY): 18 min  Problem List:  Patient Active Problem List   Diagnosis Date Noted  . CVA (cerebral vascular accident) ([place]) 04/29/2017  . Non-ST elevation (NSTEMI) myocardial infarction ([place])   . Chest pain 04/24/2017  . Left hip pain 02/23/2014  . Obesity 01/29/2012  . Osteoarthritis, knee 12/11/2011  . COLONIC POLYPS, HYPERPLASTIC 09/13/2009  . THYROMEGALY 08/10/2009  . SLEEP APNEA 08/10/2009  . VENTRAL HERNIA 01/26/2009  . DEPRESSION 07/07/2008  . DM (diabetes mellitus), type 2, uncontrolled ([place]) 05/25/2007  . HYPERCHOLESTEROLEMIA 04/02/2007  . HYPERTENSION, BENIGN ESSENTIAL 04/02/2007  . OA (osteoarthritis) of knee 04/02/2007  . UTERINE PROLAPSE 01/20/2006   Past Medical History:  Past Medical History:  Diagnosis Date  . Anemia   . Arthritis of knee    "both knees" (04/24/2017)  . Diabetes mellitus, type 2 ([place])   . History of blood transfusion    "related to prolapsed uterus"  . HLD (hyperlipidemia)   . HTN (hypertension)   . Prolapsed uterus   . Sleep apnea    "have mask; haven't worn it for a long time" (04/24/2017)   Past Surgical History:  Past Surgical History:  Procedure Laterality Date  . CARDIAC CATHETERIZATION  2011  . CORONARY STENT INTERVENTION N/A 04/27/2017   Procedure: CORONARY STENT INTERVENTION;  Surgeon: Jettie Booze, MD;  Location: [facility];  Service: Cardiovascular;  Laterality: N/A;  . LEFT HEART CATH AND CORONARY ANGIOGRAPHY N/A 04/27/2017   Procedure: LEFT HEART CATH AND CORONARY ANGIOGRAPHY;  Surgeon: Jettie Booze, MD;  Location: [facility];  Service: Cardiovascular;  Laterality: N/A;  . TUBAL LIGATION     HPI:  62 y.o. female with a PMH HTN, HLD, DM type 2, OSA, and arthritis, who presents with CP. DES to proximal Cx. Subsequently  found to have altered mental status; head CT with acute stroke.  MRI: Moderate sized acute ischemic left cerebellar infarct, left PICA territory; 8 mm acute ischemic nonhemorr right cerebellar infarct.   Assessment / Plan / Recommendation Clinical Impression  Pt awake, participatory, but maintained eyes closed for duration of evaluation except when cued to open. She demonstrated full orientation; no dysarthria; short-term recall of events/visitors and verbal memory were [facility].  Visual spatial construction WFL.  Alternating attention WFL.  Receptive and expressive language normal.  No acute needs identified - d/w pt and her sisters that she may discover higher level deficits once she enters rehab and when her cognitive system is taxed to a greater degree.  Recommend repeat SLP evaluation in next venue of care - pt/family agree.      SLP Assessment  SLP Recommendation/Assessment: All further Speech Lanaguage Pathology  needs can be addressed in the next venue of care SLP Visit Diagnosis: Cognitive communication deficit (R41.841)    Follow Up Recommendations       Frequency and Duration           SLP Evaluation Cognition  Overall Cognitive Status: Within Functional Limits for tasks assessed Arousal/Alertness: Awake/alert Orientation Level: Oriented X4 Attention: Alternating Alternating Attention: Appears intact Memory: Appears intact Executive Function: Reasoning Reasoning: Appears intact       Comprehension  Auditory Comprehension Overall Auditory Comprehension: Appears within functional limits for tasks assessed Visual Recognition/Discrimination Discrimination: Within Function Limits Reading Comprehension Reading Status: Within funtional limits    Expression Expression Primary Mode of  Expression: Verbal Verbal Expression Overall Verbal Expression: Appears within functional limits for tasks assessed Written Expression Dominant Hand: Right   Oral / Motor  Oral Motor/Sensory  Function Overall Oral Motor/Sensory Function: Within functional limits Motor Speech Overall Motor Speech: Appears within functional limits for tasks assessed   GO                    Juan Quam Laurice 04/29/2017, 2:15 PM

## 2017-04-30 ENCOUNTER — Encounter (HOSPITAL_COMMUNITY): Payer: Self-pay | Admitting: Cardiology

## 2017-04-30 DIAGNOSIS — I639 Cerebral infarction, unspecified: Secondary | ICD-10-CM

## 2017-04-30 DIAGNOSIS — I25110 Atherosclerotic heart disease of native coronary artery with unstable angina pectoris: Secondary | ICD-10-CM

## 2017-04-30 DIAGNOSIS — Z955 Presence of coronary angioplasty implant and graft: Secondary | ICD-10-CM

## 2017-04-30 DIAGNOSIS — E1165 Type 2 diabetes mellitus with hyperglycemia: Secondary | ICD-10-CM

## 2017-04-30 DIAGNOSIS — E1169 Type 2 diabetes mellitus with other specified complication: Secondary | ICD-10-CM

## 2017-04-30 HISTORY — DX: Atherosclerotic heart disease of native coronary artery with unstable angina pectoris: I25.110

## 2017-04-30 LAB — GLUCOSE, CAPILLARY
GLUCOSE-CAPILLARY: 176 mg/dL — AB (ref 65–99)
GLUCOSE-CAPILLARY: 169 mg/dL — AB (ref 65–99)
GLUCOSE-CAPILLARY: 200 mg/dL — AB (ref 65–99)
Glucose-Capillary: 224 mg/dL — ABNORMAL HIGH (ref 65–99)
Glucose-Capillary: 221 mg/dL — ABNORMAL HIGH (ref 65–99)

## 2017-04-30 LAB — URINE CULTURE

## 2017-04-30 MED ORDER — LISINOPRIL 20 MG PO TABS
20.0000 mg | ORAL_TABLET | Freq: Every day | ORAL | Status: DC
  Administered 2017-05-01 – 2017-05-04 (×4): 20 mg via ORAL
  Filled 2017-04-30 (×4): qty 1

## 2017-04-30 MED ORDER — AMLODIPINE BESYLATE 5 MG PO TABS
5.0000 mg | ORAL_TABLET | Freq: Every day | ORAL | Status: DC
  Administered 2017-05-01 – 2017-05-04 (×4): 5 mg via ORAL
  Filled 2017-04-30 (×4): qty 1

## 2017-04-30 NOTE — Clinical Social Work Note (Signed)
Clinical Social Work Assessment  Patient Details  Name: Holly Acosta MRN: [id number] Date of Birth: 1955/12/09  Date of referral:  04/29/17               Reason for consult:  Facility Placement                Permission sought to share information with:  Facility Sport and exercise psychologist, Family Supports Permission granted to share information::  Yes, Verbal Permission Granted  Name::     Serafina Royals, Amy, Tourist information centre manager  Agency::  SNF  Relationship::  Sister, Daughters, Administrator Information:     Housing/Transportation Living arrangements for the past 2 months:  Single Family Home Source of Information:  Siblings Patient Interpreter Needed:  None Criminal Activity/Legal Involvement Pertinent to Current Situation/Hospitalization:  No - Comment as needed Significant Relationships:  Siblings, Adult Children, Other Family Members Lives with:  Self, Adult Children Do you feel safe going back to the place where you live?  Yes Need for family participation in patient care:  No (Coment)  Care giving concerns:  Patient from home with family, but would benefit from short term rehab at discharge.   Social Worker assessment / plan:  CSW met with patient's sister at bedside (patient was off the floor at a test) to discuss family discussion regarding whether the patient will return home or admit to SNF. CSW discussed limitations with patient's Medicaid, how it will limit her options and that she will have to turn over her check to pay. CSW left card with contact information for patient and family to reach out after they've made a decision. CSW to fax out referral and follow up with bed offers.  Employment status:  Retired Forensic scientist:  Medicaid In [place] PT Recommendations:  [place] / Referral to community resources:  [place]  Patient/Family's Response to care:  Patient's sister said that the patient and family were considering rehab  placement, and were agreeable to looking into options.   Patient/Family's Understanding of and Emotional Response to Diagnosis, Current Treatment, and Prognosis:  Patient's sister discussed how stressful it was for the patient and family that she had a heart attack and then a stroke on top of it, but at least she was here at the hospital when it all happened. Patient's sister discussed how the family was considering all of their options and would follow up with CSW when decision was made.  Emotional Assessment Appearance:  Appears stated age Attitude/Demeanor/Rapport:  Engaged Affect (typically observed):  Pleasant Orientation:  Oriented to Self, Oriented to Place, Oriented to  Time, Oriented to Situation Alcohol / Substance use:  Not Applicable Psych involvement (Current and /or in the community):  No (Comment)  Discharge Needs  Concerns to be addressed:  Care Coordination Readmission within the last 30 days:  No Current discharge risk:  Dependent with Mobility Barriers to Discharge:  Continued Medical Work up   [employer], [place] 04/30/2017, 4:53 PM

## 2017-04-30 NOTE — Progress Notes (Signed)
CSW following for discharge plan. CSW met with patient earlier today to discuss decision for SNF vs Home. Patient states that she is still deciding, and isn't sure what she wants to do. Patient would like to continue to talk it over with her family when they arrive later this afternoon. CSW left another card at the patient's bedside for family to reach out when they arrive at the hospital to discuss discharge plan.   CSW will continue to follow.  Laveda Abbe, [place] Clinical Social Worker [phone]

## 2017-04-30 NOTE — Progress Notes (Signed)
Occupational Therapy Treatment Patient Details Name: Holly Acosta MRN: [id number] DOB: Nov 14, 1955 Today's Date: 04/30/2017    History of present illness 62 y.o. female with a PMH HTN, HLD, DM type 2, OSA, and arthritis, who presents with CP. DES to proximal Cx. 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    OT comments  Pt making significant improvement. Able to ambulate to bathroom and complete ADL task with S to minguard A @ RW level. Pt has gait deviation at baseline due to B knee limitations. Able to complete LB ADL with minguard to S. Flat affect but able to demonstrate appropriate cognitive skills for ADL. High level cognition needs to be further assessed. Pt will need S for all medication and financial management initially until she is able to demonstrate independence with these tasks.  Feel pt is safe to DC home when medically stable if she has 24/7 S and is eligible for HHOT/PT. VSS throughout session. Will continue to follow acutely.   Follow Up Recommendations  Home health OT;Supervision/Assistance - 24 hour    Equipment Recommendations  3 in 1 bedside commode    Recommendations for Other Services      Precautions / Restrictions Precautions Precautions: Fall Restrictions Weight Bearing Restrictions: Yes RUE Weight Bearing: Non weight bearing       Mobility Bed Mobility Overal bed mobility: Needs Assistance Bed Mobility: Supine to Sit;Sit to Supine     Supine to sit: Supervision Sit to supine: Supervision      Transfers Overall transfer level: Needs assistance Equipment used: Rolling walker (2 wheeled) Transfers: Sit to/from [facility] Sit to Stand: Supervision Stand pivot transfers: S            Balance Overall balance assessment: Needs assistance   Sitting balance-Leahy Scale: Good       Standing balance-Leahy Scale: Fair                              ADL either performed or assessed with clinical judgement   ADL Overall ADL's : Needs assistance/impaired     Grooming: Set up;Supervision/safety;Standing   Upper Body Bathing: Supervision/ safety;Set up;Sitting   Lower Body Bathing: Sit to/from stand;Min guard   Upper Body Dressing : Set up;Supervision/safety;Sitting   Lower Body Dressing: Min guard;Sit to/from stand   Toilet Transfer: Min guard;RW;Ambulation;BSC(over toilet)   Toileting- Water quality scientist and Hygiene: Supervision/safety       Functional mobility during ADLs: Min guard;Rolling walker       Vision   Vision Assessment?: Yes;Vision impaired- to be further tested in functional context Eye Alignment: Within Functional Limits Tracking/Visual Pursuits: Decreased smoothness of horizontal tracking Visual Fields: No apparent deficits Additional Comments: complains of minimally blurred vions.? nystagmus L gaze; Reports "it's getting better"   Perception     Praxis      Cognition Arousal/Alertness: Awake/alert Behavior During Therapy: Flat affect Overall Cognitive Status: No family/caregiver present to determine baseline cognitive functioning                                 General Comments: slow processing but appears [facility] for basic ADL tasks; higher level cognition needs to be assessed        Exercises     Shoulder Instructions       General Comments  Pertinent Vitals/ Pain       Pain Assessment: Faces Faces Pain Scale: Hurts little more Pain Location: B knees Pain Descriptors / Indicators: Discomfort Pain Intervention(s): Limited activity within patient's tolerance  Home Living                                          Prior Functioning/Environment              Frequency  Min 3X/week        Progress Toward Goals  OT Goals(current goals can now be found in the care plan section)  Progress towards OT goals: Progressing toward  goals  Acute Rehab OT Goals Patient Stated Goal: to go home OT Goal Formulation: With patient Time For Goal Achievement: 05/13/17 Potential to Achieve Goals: Good ADL Goals Pt Will Perform Grooming: with supervision;with set-up;standing Pt Will Perform Upper Body Bathing: with supervision;with set-up;sitting Pt Will Perform Upper Body Dressing: with supervision;with set-up;sitting Pt Will Transfer to Toilet: with supervision;ambulating;regular height toilet;grab bars Pt Will Perform Toileting - Clothing Manipulation and hygiene: with min guard assist;sit to/from stand  Plan Discharge plan needs to be updated;Frequency needs to be updated    Co-evaluation                 AM-PAC PT "6 Clicks" Daily Activity     Outcome Measure   Help from another person eating meals?: None Help from another person taking care of personal grooming?: None Help from another person toileting, which includes using toliet, bedpan, or urinal?: A Little Help from another person bathing (including washing, rinsing, drying)?: A Little Help from another person to put on and taking off regular upper body clothing?: None Help from another person to put on and taking off regular lower body clothing?: A Little 6 Click Score: 21    End of Session Equipment Utilized During Treatment: Gait belt;Rolling walker  OT Visit Diagnosis: Unsteadiness on feet (R26.81);Muscle weakness (generalized) (M62.81);Other symptoms and signs involving cognitive function   Activity Tolerance Patient tolerated treatment well   Patient Left in bed;with call bell/phone within reach;with bed alarm set   Nurse Communication Mobility status;Other (comment)(DC reommendations)        Time: 2751-7001 OT Time Calculation (min): 25 min  Charges: OT General Charges $OT Visit: 1 Visit OT Treatments $Self Care/Home Management : 23-37 mins  [facility], OT/L  [phone] 04/30/2017   Holly Acosta,Holly Acosta 04/30/2017, 1:47 PM

## 2017-04-30 NOTE — Telephone Encounter (Signed)
Current admit 

## 2017-04-30 NOTE — Progress Notes (Signed)
Physical Therapy Treatment Patient Details Name: Holly Acosta MRN: [id number] DOB: [birth year] Today's Date: 04/30/2017    History of Present Illness 62 y.o. female with a PMH HTN, HLD, DM type 2, OSA, and arthritis, who presents with CP. DES to proximal Cx. 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     PT Comments    Patient progressing with distance with ambulation.  Feel currently safest plan is for d/c to SNF due to impaired balance when standing without UE support.  Likely this is sequela from stroke and will benefit from daily PT for progressing safety, balance and independence.  PT to follow.   Follow Up Recommendations  SNF;Supervision/Assistance - 24 hour     Equipment Recommendations  Other (comment)(rollator)    Recommendations for Other Services       Precautions / Restrictions Precautions Precautions: Fall Restrictions Weight Bearing Restrictions: No    Mobility  Bed Mobility Overal bed mobility: Needs Assistance Bed Mobility: Supine to Sit;Sit to Supine     Supine to sit: Supervision Sit to supine: Supervision   General bed mobility comments: assist for safety  Transfers Overall transfer level: Needs assistance Equipment used: Rolling walker (2 wheeled) Transfers: Sit to/from Stand Sit to Stand: Min guard Stand pivot transfers: Min guard       General transfer comment: assist for holding walker as pt pulls up on walker  Ambulation/Gait Ambulation/Gait assistance: Min guard Ambulation Distance (Feet): 80 Feet Assistive device: Rolling walker (2 wheeled) Gait Pattern/deviations: Step-to pattern;Step-through pattern;Decreased stride length;Wide base of support;Trunk flexed     General Gait Details: heavy leaning on walker with ambulation, keeps walker in front of her, reports used walker PTA due to knees   Stairs            Wheelchair Mobility    Modified Rankin  (Stroke Patients Only)       Balance Overall balance assessment: Needs assistance   Sitting balance-Leahy Scale: Good Sitting balance - Comments: sat up to don socks   Standing balance support: No upper extremity supported Standing balance-Leahy Scale: Poor Standing balance comment: needs min A for balance when without UE support due to falling forward or backward depending on posture with multiple attempts                            Cognition Arousal/Alertness: Awake/alert Behavior During Therapy: Flat affect Overall Cognitive Status: No family/caregiver present to determine baseline cognitive functioning                                 General Comments: slow processing and limited insight, but WFL for basic tasks      Exercises      General Comments        Pertinent Vitals/Pain Pain Assessment: Faces Faces Pain Scale: Hurts little more Pain Location: B knees Pain Descriptors / Indicators: Discomfort;Aching Pain Intervention(s): Monitored during session;Repositioned    Home Living                      Prior Function            PT Goals (current goals can now be found in the care plan section) Acute Rehab PT Goals Patient Stated Goal: to go home Progress towards PT goals: Progressing toward goals    Frequency  Min 3X/week      PT Plan Current plan remains appropriate;Frequency needs to be updated    Co-evaluation              AM-PAC PT "6 Clicks" Daily Activity  Outcome Measure  Difficulty turning over in bed (including adjusting bedclothes, sheets and blankets)?: A Little Difficulty moving from lying on back to sitting on the side of the bed? : A Little Difficulty sitting down on and standing up from a chair with arms (e.g., wheelchair, bedside commode, etc,.)?: Unable Help needed moving to and from a bed to chair (including a wheelchair)?: A Little Help needed walking in hospital room?: A Little Help  needed climbing 3-5 steps with a railing? : Total 6 Click Score: 14    End of Session Equipment Utilized During Treatment: Gait belt Activity Tolerance: Patient tolerated treatment well Patient left: in bed;with call bell/phone within reach   PT Visit Diagnosis: Other abnormalities of gait and mobility (R26.89)     Time: 1117-3567 PT Time Calculation (min) (ACUTE ONLY): 23 min  Charges:  $Gait Training: 8-22 mins $Therapeutic Activity: 8-22 mins                    G CodesMagda Kiel, [place] [phone] 04/30/2017    Reginia Naas 04/30/2017, 4:47 PM

## 2017-04-30 NOTE — Progress Notes (Signed)
Progress Note  Patient Name: Holly Acosta Date of Encounter: 04/30/2017  Primary Cardiologist: Peter Martinique, MD   Subjective   Pt awake and answering questions. She feels "pretty good" - just "wobbly" when she was up with assistance (RN notes Max assist)   Inpatient Medications    Scheduled Meds: . [START ON 05/01/2017] amLODipine  5 mg Oral Daily  . aspirin EC  81 mg Oral Daily  . carvedilol  12.5 mg Oral BID WC  . insulin aspart  0-15 Units Subcutaneous TID WC  . insulin aspart  0-5 Units Subcutaneous QHS  . insulin glargine  25 Units Subcutaneous Q2200  . [START ON 05/01/2017] lisinopril  20 mg Oral Daily  . rosuvastatin  40 mg Oral Daily  . ticagrelor  90 mg Oral BID   Continuous Infusions: . sodium chloride     PRN Meds: sodium chloride, acetaminophen, acetaminophen, magnesium hydroxide, ondansetron (ZOFRAN) IV, sodium chloride flush   Vital Signs    Vitals:   04/30/17 0500 04/30/17 0600 04/30/17 0756 04/30/17 1131  BP: 138/84  (!) 145/79 136/68  Pulse: 67  65 61  Resp: 18  18 18   Temp: 98 F (36.7 C)  97.6 F (36.4 C) 97.9 F (36.6 C)  TempSrc: Oral  Oral Oral  SpO2: 94%  95% 91%  Weight:  183 lb 10.3 oz (83.3 kg)    Height:        Intake/Output Summary (Last 24 hours) at 04/30/2017 1201 Last data filed at 04/30/2017 1131 Gross per 24 hour  Intake 240 ml  Output 2500 ml  Net -2260 ml   Filed Weights   04/28/17 0221 04/29/17 0637 04/30/17 0600  Weight: 182 lb 15.7 oz (83 kg) 187 lb 9.8 oz (85.1 kg) 183 lb 10.3 oz (83.3 kg)    Telemetry    sinus - Personally Reviewed  ECG    No new tracings - Personally Reviewed  Physical Exam   GEN: No acute distress.   Neck: No JVD Cardiac: RRR, no murmurs, rubs, or gallops.  Respiratory: Clear to auscultation bilaterally. GI: Soft, nontender, non-distended  MS: No edema; No deformity. Neuro:  Nonfocal  Psych: Normal affect   Labs    Chemistry Recent Labs  Lab 04/26/17 0406 04/28/17 0246  04/28/17 1634  NA 134* 135 134*  K 4.3 4.5 4.1  CL 101 102 100*  CO2 21* 22 24  GLUCOSE 317* 272* 280*  BUN 15 14 16   CREATININE 0.96 0.94 0.98  CALCIUM 9.3 9.4 9.3  GFRNONAA >60 >60 >60  GFRAA >60 >60 >60  ANIONGAP 12 11 10      Hematology Recent Labs  Lab 04/27/17 0326 04/28/17 0246 04/29/17 0557  WBC 10.7* 13.3* 13.9*  RBC 4.58 4.88 4.70  HGB 12.9 13.9 13.2  HCT 40.2 42.7 41.0  MCV 87.8 87.5 87.2  MCH 28.2 28.5 28.1  MCHC 32.1 32.6 32.2  RDW 14.2 14.2 14.1  PLT 238 266 284    Cardiac Enzymes Recent Labs  Lab 04/24/17 1253 04/24/17 1610 04/25/17 0058  TROPONINI 0.83* 1.83* >65.00*    Recent Labs  Lab 04/24/17 0027 04/24/17 0907  TROPIPOC 0.00 0.08     BNP Recent Labs  Lab 04/24/17 0901  BNP 31.0     DDimer  Recent Labs  Lab 04/24/17 0901  DDIMER <0.27     Radiology    Ct Angio Head W Or Wo Contrast  Result Date: 04/29/2017 CLINICAL DATA:  Stroke follow-up EXAM: CT ANGIOGRAPHY HEAD  AND NECK TECHNIQUE: Multidetector CT imaging of the head and neck was performed using the standard protocol during bolus administration of intravenous contrast. Multiplanar CT image reconstructions and MIPs were obtained to evaluate the vascular anatomy. Carotid stenosis measurements (when applicable) are obtained utilizing NASCET criteria, using the distal internal carotid diameter as the denominator. CONTRAST:  21mL ISOVUE-370 IOPAMIDOL (ISOVUE-370) INJECTION 76% COMPARISON:  Brain MRI from earlier today. FINDINGS: CTA NECK FINDINGS Aortic arch: Atheromatous wall calcification. Low-density along the arch and proximal descending segment is attributed to atelectasis and non-opacified vein. Two vessel branching pattern. Right carotid system: Tortuosity without stenosis, dissection, or beading. Mild calcified plaque the proximal ECA. Left carotid system: Common carotid tortuosity. Mild atheromatous plaque on the distal common carotid and at the bifurcation. No stenosis,  beading, or dissection. Vertebral arteries: No proximal subclavian stenosis. There is subclavian atherosclerosis without ulceration. Mild right vertebral artery dominance. Both vertebral arteries are smooth and widely patent to the dura. Skeleton: Cervical facet spurring.  No acute or aggressive finding. Other neck: Small thyroid nodules, none measuring over 1 cm, below size threshold for strict imaging follow-up per consensus guidelines. Upper chest: Negative Review of the MIP images confirms the above findings CTA HEAD FINDINGS Anterior circulation: Calcified plaque on the carotid siphons without flow limiting stenosis. No branch occlusion, beading, or aneurysm. Posterior circulation: The mid left PICA is occluded, correlating with the acute infarct. The vertebral and basilar arteries are smooth and diffusely patent. Good flow in bilateral posterior cerebral arteries. Hypoplastic right P1 segment. Venous sinuses: Patent on the delayed phase. Anatomic variants: None significant Delayed phase: No abnormal intracranial enhancement. Review of the MIP images confirms the above findings IMPRESSION: 1. Mid left PICA occlusion correlating with known acute infarct. No underlying embolic source or flow limiting stenosis identified. 2. Overall mild intracranial and cervical atherosclerosis. Electronically Signed   By: Monte Fantasia M.D.   On: 04/29/2017 11:35   Ct Head Wo Contrast  Result Date: 04/28/2017 CLINICAL DATA:  Altered mental status EXAM: CT HEAD WITHOUT CONTRAST TECHNIQUE: Contiguous axial images were obtained from the base of the skull through the vertex without intravenous contrast. COMPARISON:  None. FINDINGS: Brain: There is hypoattenuation within the medial left cerebellar hemisphere consistent with acute to early subacute infarct. There is mass effect on the fourth ventricle. No hydrocephalus. No intracranial hemorrhage or extra-axial collection. Bilateral basal ganglia mineralization. Otherwise normal  appearance of the brain for age. Vascular: Atherosclerotic calcification of the internal carotid arteries at the skull base. No hyperdense vessel. Skull: Normal visualized skull base, calvarium and extracranial soft tissues. Sinuses/Orbits: No sinus fluid levels or advanced mucosal thickening. No mastoid effusion. Normal orbits. IMPRESSION: Acute to early subacute left posterior inferior cerebellar artery territory infarct without hemorrhage. Mild mass effect on the fourth ventricle, but no hydrocephalus. These results will be called to the ordering clinician or representative by the Radiologist Assistant, and communication documented in the PACS or zVision Dashboard. Electronically Signed   By: Ulyses Jarred M.D.   On: 04/28/2017 20:00   Ct Angio Neck W Or Wo Contrast  Result Date: 04/29/2017 CLINICAL DATA:  Stroke follow-up EXAM: CT ANGIOGRAPHY HEAD AND NECK TECHNIQUE: Multidetector CT imaging of the head and neck was performed using the standard protocol during bolus administration of intravenous contrast. Multiplanar CT image reconstructions and MIPs were obtained to evaluate the vascular anatomy. Carotid stenosis measurements (when applicable) are obtained utilizing NASCET criteria, using the distal internal carotid diameter as the denominator. CONTRAST:  39mL ISOVUE-370  IOPAMIDOL (ISOVUE-370) INJECTION 76% COMPARISON:  Brain MRI from earlier today. FINDINGS: CTA NECK FINDINGS Aortic arch: Atheromatous wall calcification. Low-density along the arch and proximal descending segment is attributed to atelectasis and non-opacified vein. Two vessel branching pattern. Right carotid system: Tortuosity without stenosis, dissection, or beading. Mild calcified plaque the proximal ECA. Left carotid system: Common carotid tortuosity. Mild atheromatous plaque on the distal common carotid and at the bifurcation. No stenosis, beading, or dissection. Vertebral arteries: No proximal subclavian stenosis. There is subclavian  atherosclerosis without ulceration. Mild right vertebral artery dominance. Both vertebral arteries are smooth and widely patent to the dura. Skeleton: Cervical facet spurring.  No acute or aggressive finding. Other neck: Small thyroid nodules, none measuring over 1 cm, below size threshold for strict imaging follow-up per consensus guidelines. Upper chest: Negative Review of the MIP images confirms the above findings CTA HEAD FINDINGS Anterior circulation: Calcified plaque on the carotid siphons without flow limiting stenosis. No branch occlusion, beading, or aneurysm. Posterior circulation: The mid left PICA is occluded, correlating with the acute infarct. The vertebral and basilar arteries are smooth and diffusely patent. Good flow in bilateral posterior cerebral arteries. Hypoplastic right P1 segment. Venous sinuses: Patent on the delayed phase. Anatomic variants: None significant Delayed phase: No abnormal intracranial enhancement. Review of the MIP images confirms the above findings IMPRESSION: 1. Mid left PICA occlusion correlating with known acute infarct. No underlying embolic source or flow limiting stenosis identified. 2. Overall mild intracranial and cervical atherosclerosis. Electronically Signed   By: Monte Fantasia M.D.   On: 04/29/2017 11:35   Mr Brain Wo Contrast  Result Date: 04/29/2017 CLINICAL DATA:  Initial evaluation for acute stroke. EXAM: MRI HEAD WITHOUT CONTRAST TECHNIQUE: Multiplanar, multiecho pulse sequences of the brain and surrounding structures were obtained without intravenous contrast. COMPARISON:  Prior CT from 04/28/2017. FINDINGS: Brain: Generalized age-related cerebral atrophy. Patchy and confluent T2/FLAIR hyperintensity within the periventricular, deep, and subcortical white matter both cerebral hemispheres, nonspecific, but most like related to chronic small vessel ischemic changes, moderate nature. There is an acute moderate size left PICA territory infarct involving the  inferomedial left cerebellar hemisphere. Mild petechial hemorrhage without frank hemorrhagic transformation. Localized edema with mild mass effect on the fourth ventricular outflow tract which remains patent at this time. No hydrocephalus. Additional punctate 8 mm acute ischemic infarct within the inferior right cerebellar hemisphere, right PICA territory. No associated hemorrhage or mass effect. No other evidence for acute or subacute ischemia. Gray-white matter differentiation otherwise maintained. No other areas of chronic infarction identified. No other evidence for acute or chronic intracranial hemorrhage. No mass lesion or midline shift. No hydrocephalus. No extra-axial fluid collection. Major dural sinuses are grossly patent. Incidental note made of a partially empty sella. Midline structures intact and normal. Vascular: Major intracranial vascular flow voids are maintained at the skull base. Right vertebral artery is dominant. Skull and upper cervical spine: Craniocervical junction within normal limits. Upper cervical spine unremarkable. Bone marrow signal intensity within normal limits. No scalp soft tissue abnormality. Sinuses/Orbits: Globes and orbital soft tissues within normal limits. Paranasal sinuses are clear. Trace opacity bilateral mastoid air cells, of doubtful significance. Inner ear structures normal. Other: None. IMPRESSION: 1. 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 Associated mild petechial hemorrhage without frank hemorrhagic transformation. Localized edema with mild mass effect on the fourth ventricular outflow tract which remains patent at this time. No hydrocephalus. 2. Additional 8 mm acute ischemic nonhemorrhagic right cerebellar infarct. 3. Moderate nonspecific  cerebral white matter changes, most likely related to chronic small vessel ischemic disease. Electronically Signed   By: Jeannine Boga M.D.   On: 04/29/2017 06:03   Dg Chest Port 1v  Same Day  Result Date: 04/28/2017 CLINICAL DATA:  Pneumonia. Shortness of breath. Dizziness on exertion. EXAM: PORTABLE CHEST 1 VIEW COMPARISON:  04/24/2017. FINDINGS: Mediastinum and hilar structures normal. Heart size stable. No pulmonary venous congestion. Low lung volumes with mild basilar atelectasis. No pleural effusion or pneumothorax. Degenerative change thoracic spine. Thoracic scoliosis. IMPRESSION: Low lung volumes with mild basilar atelectasis, otherwise negative chest. Chest is stable from 04/24/2017. Electronically Signed   By: Marcello Moores  Register   On: 04/28/2017 12:58    Cardiac Studies   Left heart cath 04/27/17:  Prox RCA lesion is 25% stenosed. Mid RCA to Dist RCA lesion is 25% stenosed.  Prox LAD lesion is 25% stenosed.  The left ventricular systolic function is normal. The left ventricular ejection fraction is 55-65% by visual estimate.  LV end diastolic pressure is mildly elevated. LVEDP 18 mm Hg.   Prox Cx lesion is 99% stenosed. This was the culprit lesion.  PCI: A drug-eluting stent was successfully placed using a STENT SIERRA 3.50 X 18 MM.  Post intervention, there is a 0% residual stenosis.  Continue dual antiplatelet therapy for at least one year with aggressive secondary prevention.  Stress importance of compliance with her Brilinta.    Echo 04/25/17: Normal LV size, Mod LVH (focal basal). EF 55-60%. No RWMA. Gr 2 DD  (pseudonormal left ventricular filling pattern)   Repeat Echo 4/10: Mod Conc LVH. EF 55-60%. Gr 2 DD. hypokinesis of the basal inferior, inferolateral and mid   inferolateral walls,  Patient Profile     62 y.o. female with a PMH HTN, HLD, DM type 2, OSA, and arthritis, who presents with CP. DES to proximal Cx. Subsequently found to have altered mental status; head CT with acute stroke.   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 NSTEMI / CAD w/ UA - s/p PCI  Heart catheterization with DES to proximal Cx. On ASA and brilinta. Continue statin. No chest pain.    2. Lethargy, altered mental status, leukocytosis Medicine consulted 2 d ago for sleepiness, lethargy. She did not have focal neurological deficits.  Head CT with evidence of acute and subacute left PICA infarct. MRI brain with moderate sized acute ischemic left cerebellar infarct without frank hemorrhagic transformation- CT Angio confirms PICA occlusion. UA negative for UTI, CXR without signs of acute infection. Leukocytosis may be in response to stroke. Management per neurology who signed off yesterday - allow permissive HTN  3. HTN -- amlodipine, lisinopril & coreg 12.5 mg bid Permissive BP per neuro. Pressures 130-160s. I have reduced Amlodipine to 5 mg & Lisinopril to 20mg  (can titrate back up in OP f/u)  4. DM -- Continue insulin for tight BG control. Repeat A1c 13.3%. -- will need close OP management.  5. HLD - on Crestor 40mg   Patient examined chart reviewed. Lethargy 2 d ago(4/9) prompted head CT post cath which showed  CVA. Needs to continue DAT given recent DES. BP is ok no signs of infection. No chest pain or arrhythmia on telemetry Appreciate neurology and IM care  Pt is s/p CVA. PT recommending SNF. Initially patient going to d/c home with [facility] services but family deciding on rehab currently. CM following for d/c disposition. PT will work with her today = apparently waiting to see what the family decides re: D/C SNF vs. Home with [place].   For questions or updates, please contact [place] Please consult  [email or web address] for contact info under Cardiology/STEMI.      Signed, Glenetta Hew, MD  04/30/2017, 12:01 PM

## 2017-05-01 LAB — GLUCOSE, CAPILLARY
GLUCOSE-CAPILLARY: 265 mg/dL — AB (ref 65–99)
Glucose-Capillary: 226 mg/dL — ABNORMAL HIGH (ref 65–99)
Glucose-Capillary: 325 mg/dL — ABNORMAL HIGH (ref 65–99)
Glucose-Capillary: 254 mg/dL — ABNORMAL HIGH (ref 65–99)

## 2017-05-01 LAB — CREATININE, SERUM
CREATININE: 0.86 mg/dL (ref 0.44–1.00)
GFR calc Af Amer: >60 mL/min (ref >60)

## 2017-05-01 NOTE — Progress Notes (Signed)
(  late entry) At approximately 0900 on 4/9, spoke with Fabian Sharp, PA, concerning patient's readiness for discharge, as indicated in earlier  cardiology note, given her LOC, which is still restricted since cath yesterday, with patient being very lethargic, but A&O X 4 with normal verbal stimulus.  She came to see patient, and agreed.  PT ordered.  Patient also given Narcan per verbal order with very mild result.  WBC noted to be elevated, and UA was ordered.

## 2017-05-01 NOTE — Progress Notes (Addendum)
Progress Note  Patient Name: Holly Acosta Date of Encounter: 05/01/2017  Primary Cardiologist: Dr. Peter Martinique   Subjective   Pt feeling better today. Up to the chair eating lunch. Discussion about transition to rehab/NF. She is to meet with daughter and CSW to confirm, but pt feel that it would be a good idea.   Inpatient Medications    Scheduled Meds: . amLODipine  5 mg Oral Daily  . aspirin EC  81 mg Oral Daily  . carvedilol  12.5 mg Oral BID WC  . insulin aspart  0-15 Units Subcutaneous TID WC  . insulin aspart  0-5 Units Subcutaneous QHS  . insulin glargine  25 Units Subcutaneous Q2200  . lisinopril  20 mg Oral Daily  . rosuvastatin  40 mg Oral Daily  . ticagrelor  90 mg Oral BID   Continuous Infusions: . sodium chloride     PRN Meds: sodium chloride, acetaminophen, acetaminophen, magnesium hydroxide, ondansetron (ZOFRAN) IV, sodium chloride flush   Vital Signs    Vitals:   05/01/17 0046 05/01/17 0426 05/01/17 0500 05/01/17 0838  BP: 130/81 (!) 154/85  123/90  Pulse: 66 63  73  Resp: 16 18  18   Temp: 98.9 F (37.2 C) 98.9 F (37.2 C)  98.3 F (36.8 C)  TempSrc: Oral Oral  Oral  SpO2: (!) 89% 94%  97%  Weight:   187 lb 6.3 oz (85 kg)   Height:        Intake/Output Summary (Last 24 hours) at 05/01/2017 1128 Last data filed at 05/01/2017 0000 Gross per 24 hour  Intake 320 ml  Output 750 ml  Net -430 ml   Filed Weights   04/29/17 0637 04/30/17 0600 05/01/17 0500  Weight: 187 lb 9.8 oz (85.1 kg) 183 lb 10.3 oz (83.3 kg) 187 lb 6.3 oz (85 kg)    Physical Exam   General: Well developed, well nourished, NAD Skin: Warm, dry, intact  Head: Normocephalic, atraumatic, clear, moist mucus membranes. Neck: Negative for carotid bruits. No JVD Lungs:Clear to ausculation bilaterally. No wheezes, rales, or rhonchi. Breathing is unlabored. Cardiovascular: RRR with S1 S2. No murmurs, rubs, or gallops Abdomen: Soft, non-tender, non-distended with normoactive  bowel sounds.  No obvious abdominal masses. MSK: Strength and tone appear normal for age. 5/5 in all extremities Extremities: No edema. No clubbing or cyanosis. DP/PT pulses 2+ bilaterally Neuro: Alert and oriented. No focal deficits. No facial asymmetry. MAE spontaneously. Psych: Responds to questions appropriately with normal affect.    Labs    Chemistry Recent Labs  Lab 04/26/17 0406 04/28/17 0246 04/28/17 1634 05/01/17 0457  NA 134* 135 134*  --   K 4.3 4.5 4.1  --   CL 101 102 100*  --   CO2 21* 22 24  --   GLUCOSE 317* 272* 280*  --   BUN 15 14 16   --   CREATININE 0.96 0.94 0.98 0.86  CALCIUM 9.3 9.4 9.3  --   GFRNONAA >60 >60 >60 >60  GFRAA >60 >60 >60 >60  ANIONGAP 12 11 10   --      Hematology Recent Labs  Lab 04/27/17 0326 04/28/17 0246 04/29/17 0557  WBC 10.7* 13.3* 13.9*  RBC 4.58 4.88 4.70  HGB 12.9 13.9 13.2  HCT 40.2 42.7 41.0  MCV 87.8 87.5 87.2  MCH 28.2 28.5 28.1  MCHC 32.1 32.6 32.2  RDW 14.2 14.2 14.1  PLT 238 266 284    Cardiac Enzymes Recent Labs  Lab 04/24/17 1253 04/24/17 1610 04/25/17 0058  TROPONINI 0.83* 1.83* >65.00*   No results for input(s): TROPIPOC in the last 168 hours.   BNPNo results for input(s): BNP, PROBNP in the last 168 hours.   DDimer No results for input(s): DDIMER in the last 168 hours.   Radiology    No results found.  Telemetry    05/01/17 NSR 67 - Personally Reviewed  ECG    No new tracings as of 05/01/17- Personally Reviewed  Cardiac Studies   Left heart cath 04/27/17:  Prox RCA lesion is 25% stenosed. Mid RCA to Dist RCA lesion is 25% stenosed.  Prox LAD lesion is 25% stenosed.  The left ventricular systolic function is normal. The left ventricular ejection fraction is 55-65% by visual estimate.  LV end diastolic pressure is mildly elevated. LVEDP 18 mm Hg.   Prox Cx lesion is 99% stenosed. This was the culprit lesion.  PCI: A drug-eluting stent was successfully placed using a STENT  SIERRA 3.50 X 18 MM.  Post intervention, there is a 0% residual stenosis.  Continue dual antiplatelet therapy for at least one year with aggressive secondary prevention.  Stress importance of compliance with her Brilinta.   Echo 04/25/17: Normal LV size, Mod LVH (focal basal). EF 55-60%. No RWMA. Gr 2 DD  (pseudonormal left ventricular filling pattern)   Repeat Echo 4/10: Mod Conc LVH. EF 55-60%. Gr 2 DD. hypokinesis of the basal inferior, inferolateral and mid inferolateral walls,  Patient Profile     62 y.o. female PMH HTN, HLD, DM type 2, OSA, and arthritis, who presents with CP. DES to proximal Cx. Subsequently found to have altered mental status; head CT with acute stroke.   Assessment & Plan    1. NSTEMI / CAD w/ UA - s/p PCI:-Cardiac catheterization on 05/07/17 with DES to prox LCx.  Denies chest pain or associated symptoms   ASA, Brilinta  On carvedilol, amlodipine and ARB. -->  ARB and amlodipine dose is reduced to allow for permissive hypertension.  Continue statin  2. Lethargy, altered mental status, leukocytosis: Most likely consistent with CVA Initially internal medicine was consulted, chest x-ray benign, UA negative for UTI.  Leukocytosis improved.  Subsequently neurology consult with diagnosis of CVA. -No focal neurological deficits although head CT with evidence of acute and subacute left PICA infarct. MRI brain with moderate sized acute ischemic left cerebellar infarct without frank hemorrhagic transformation  Allow for permissive HTN  3. HTN: -Elevated, 123/90>154/85>130/81 -Amlodipine 5, lisinopril & coreg  -Given recent CVA, allowing for permissive HTN --reduced dose of amlodipine and lisinopril.  In 5 days, would increase back to 10 mg amlodipine.   4. DM: -SSI, moderate control  -HbA1c, 13.3 on 04/29/17 -Will need close follow up for DM management --discussed with [facility] Service who will follow her as an outpatient.  They will arrange  outpatient follow-up.  For now we will continue on combination of Lantus plus with meal coverage and sliding scale.   5. HLD: -CHO-173, HDL-34, LDL-107, Trig-159 -Continue current dose of rosuvastatin -Would benefit from nutritional consult given uncontrolled DM and HLD    Signed, Kathyrn Drown NP-C [facility] Pager: [phone] 05/01/2017, 11:28 AM    I have seen, examined and evaluated the patient this AM along with Kathyrn Drown, NP.  After reviewing all the available data and chart, we discussed the patients laboratory, study & physical findings as well as symptoms in detail. I agree with her findings, examination as well as impression recommendations  as per our discussion.    Attending adjustments noted in italics.   Overall relatively stable now from both a cardiac and neurologic standpoint.  She remains somewhat dizzy with poor balance and ataxic from her stroke which would mean that she is probably better served being discharged to short-term outpatient sniff or rehab.  After discussing with the patient and her daughter.  They seem to be in agreement that this is the best idea.  Currently social worker is working on bed placement. FL 2 has been signed.  Plan on discharge would be for her to go back to 10 mg and amlodipine by mid middle of next week and then pending blood pressure evaluation and follow-up would go back up on lisinopril. She will to stay on dual antiplatelet therapy and current dose of Crestor beta-blocker.    we are hoping to move toward discharge pending bed placement.      Glenetta Hew, M.D., M.S. Interventional Cardiologist  Pager # [phone] Phone # [phone] [address] [place], [place] [address]     For questions or updates, please contact   Please consult [email or web address] for contact info under Cardiology/STEMI.

## 2017-05-01 NOTE — Progress Notes (Signed)
Physical Therapy Treatment Patient Details Name: Holly Acosta MRN: [id number] DOB: [birth year] Today's Date: 05/01/2017    History of Present Illness 62 y.o. female with a PMH HTN, HLD, DM type 2, OSA, and arthritis, who presents with CP. DES to proximal Cx. 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     PT Comments    Patient progressing with therapy. Session focused on improving gait mechanics, now ambulating with intermittent close supervision/min guard without LOB, as well as more fluid gait with better use of RW after cues. SNF recs still appropriate at this time.    Follow Up Recommendations  SNF;Supervision/Assistance - 24 hour     Equipment Recommendations  Other (comment)    Recommendations for Other Services       Precautions / Restrictions Precautions Precautions: Fall Restrictions Weight Bearing Restrictions: No RUE Weight Bearing: Non weight bearing    Mobility  Bed Mobility Overal bed mobility: Needs Assistance Bed Mobility: Supine to Sit     Supine to sit: Supervision        Transfers Overall transfer level: Needs assistance Equipment used: Rolling walker (2 wheeled) Transfers: Sit to/from Stand Sit to Stand: Min guard Stand pivot transfers: Min guard          Ambulation/Gait Ambulation/Gait assistance: Min guard Ambulation Distance (Feet): 85 Feet Assistive device: Rolling walker (2 wheeled) Gait Pattern/deviations: Step-to pattern;Step-through pattern;Decreased stride length;Wide base of support;Trunk flexed Gait velocity: decreased   General Gait Details: Patient with improved mechanics, cues for use of RW and equal step length.    Stairs             Wheelchair Mobility    Modified Rankin (Stroke Patients Only)       Balance Overall balance assessment: Needs assistance Sitting-balance support: Bilateral upper extremity supported;Feet  supported Sitting balance-Leahy Scale: Good     Standing balance support: No upper extremity supported Standing balance-Leahy Scale: Poor Standing balance comment: needs min guard                            Cognition Arousal/Alertness: Awake/alert Behavior During Therapy: Flat affect Overall Cognitive Status: No family/caregiver present to determine baseline cognitive functioning Area of Impairment: Attention;Following commands;Safety/judgement;Awareness;Problem solving                   Current Attention Level: Focused   Following Commands: Follows one step commands inconsistently;Follows one step commands with increased time Safety/Judgement: Decreased awareness of safety;Decreased awareness of deficits Awareness: Emergent Problem Solving: Decreased initiation;Slow processing;Difficulty sequencing;Requires verbal cues General Comments: slow processing and limited insight, but [facility] for basic tasks      Exercises      General Comments        Pertinent Vitals/Pain Pain Assessment: No/denies pain    Home Living                      Prior Function            PT Goals (current goals can now be found in the care plan section) Acute Rehab PT Goals PT Goal Formulation: With patient Time For Goal Achievement: 05/12/17 Potential to Achieve Goals: Good Progress towards PT goals: Progressing toward goals    Frequency    Min 3X/week      PT Plan Current plan remains appropriate;Frequency needs to be updated    Co-evaluation  AM-PAC PT "6 Clicks" Daily Activity  Outcome Measure  Difficulty turning over in bed (including adjusting bedclothes, sheets and blankets)?: A Little Difficulty moving from lying on back to sitting on the side of the bed? : A Little Difficulty sitting down on and standing up from a chair with arms (e.g., wheelchair, bedside commode, etc,.)?: Unable Help needed moving to and from a bed to chair  (including a wheelchair)?: A Little Help needed walking in hospital room?: A Little Help needed climbing 3-5 steps with a railing? : Total 6 Click Score: 14    End of Session Equipment Utilized During Treatment: Gait belt Activity Tolerance: Patient tolerated treatment well Patient left: in bed;with call bell/phone within reach Nurse Communication: Mobility status PT Visit Diagnosis: Other abnormalities of gait and mobility (R26.89)     Time: 1745-1800 PT Time Calculation (min) (ACUTE ONLY): 15 min  Charges:  $Gait Training: 8-22 mins                    G Codes:       Reinaldo Berber, PT, DPT Acute Rehab Services Pager: [phone]     Reinaldo Berber 05/01/2017, 5:56 PM

## 2017-05-01 NOTE — Progress Notes (Signed)
CSW following for discharge plan. Patient has decided that she would like to go for rehab prior to returning home. CSW working to obtain bed offers for the patient, difficult due to patient having Medicaid. CSW contacted Admissions at [facility], had not heard anything back. CSW contacted Admissions at [facility], they will likely be able to take the patient tomorrow.  CSW alerted patient. CSW will continue to follow.  Laveda Abbe, [place] Clinical Social Worker [phone]

## 2017-05-01 NOTE — Telephone Encounter (Signed)
Currently admitted.

## 2017-05-01 NOTE — Progress Notes (Signed)
Occupational Therapy Treatment Patient Details Name: Holly Acosta MRN: [id number] DOB: [birth year] Today's Date: 05/01/2017    History of present illness 62 y.o. female with a PMH HTN, HLD, DM type 2, OSA, and arthritis, who presents with CP. DES to proximal Cx. 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    OT comments  Pt. Motivated and agreeable to oob and participation in skilled OT. LB dressing, Bed mobility and toileting tasks min guard a.    Follow Up Recommendations  Home health OT;Supervision/Assistance - 24 hour    Equipment Recommendations  3 in 1 bedside commode    Recommendations for Other Services      Precautions / Restrictions Precautions Precautions: Fall Restrictions Weight Bearing Restrictions: No RUE Weight Bearing: Non weight bearing       Mobility Bed Mobility   Bed Mobility: Supine to Sit     Supine to sit: Supervision        Transfers Overall transfer level: Needs assistance Equipment used: Rolling walker (2 wheeled) Transfers: Sit to/from Stand Sit to Stand: Min guard Stand pivot transfers: Min guard            Balance                                           ADL either performed or assessed with clinical judgement   ADL Overall ADL's : Needs assistance/impaired                     Lower Body Dressing: Sitting/lateral leans;Set up Lower Body Dressing Details (indicate cue type and reason): don both socks eob Toilet Transfer: Min guard;RW;Ambulation   Toileting- Clothing Manipulation and Hygiene: Supervision/safety;Sitting/lateral lean       Functional mobility during ADLs: Min guard;Rolling walker       Vision       Perception     Praxis      Cognition   Behavior During Therapy: Flat affect Overall Cognitive Status: No family/caregiver present to determine baseline cognitive functioning                                           Exercises     Shoulder Instructions       General Comments      Pertinent Vitals/ Pain       Pain Assessment: No/denies pain  Home Living                                          Prior Functioning/Environment              Frequency  Min 3X/week        Progress Toward Goals  OT Goals(current goals can now be found in the care plan section)  Progress towards OT goals: Progressing toward goals     Plan      Co-evaluation                 AM-PAC PT "6 Clicks" Daily Activity     Outcome Measure   Help from another person eating meals?: None Help from another person taking care  of personal grooming?: None Help from another person toileting, which includes using toliet, bedpan, or urinal?: A Little Help from another person bathing (including washing, rinsing, drying)?: A Little Help from another person to put on and taking off regular upper body clothing?: None Help from another person to put on and taking off regular lower body clothing?: A Little 6 Click Score: 21    End of Session Equipment Utilized During Treatment: Gait belt;Rolling walker  OT Visit Diagnosis: Unsteadiness on feet (R26.81);Muscle weakness (generalized) (M62.81);Other symptoms and signs involving cognitive function   Activity Tolerance Patient tolerated treatment well   Patient Left with call bell/phone within reach;with bed alarm set;in chair   Nurse Communication          Time: 3143-8887 OT Time Calculation (min): 13 min  Charges: OT General Charges $OT Visit: 1 Visit OT Treatments $Self Care/Home Management : 8-22 mins   Janice Coffin, COTA/L 05/01/2017, 1:06 PM

## 2017-05-02 LAB — GLUCOSE, CAPILLARY
GLUCOSE-CAPILLARY: 200 mg/dL — AB (ref 65–99)
GLUCOSE-CAPILLARY: 241 mg/dL — AB (ref 65–99)
GLUCOSE-CAPILLARY: 264 mg/dL — AB (ref 65–99)
GLUCOSE-CAPILLARY: 209 mg/dL — AB (ref 65–99)

## 2017-05-02 MED ORDER — INSULIN GLARGINE 100 UNIT/ML ~~LOC~~ SOLN
40.0000 [IU] | Freq: Every day | SUBCUTANEOUS | Status: DC
  Administered 2017-05-02 – 2017-05-03 (×2): 40 [IU] via SUBCUTANEOUS
  Filled 2017-05-02 (×3): qty 0.4

## 2017-05-02 MED ORDER — TICAGRELOR 90 MG PO TABS: 90.0000 mg | ORAL_TABLET | Freq: Two times a day (BID) | ORAL | 3 refills | Status: DC

## 2017-05-02 MED ORDER — LISINOPRIL 20 MG PO TABS: 20.0000 mg | ORAL_TABLET | Freq: Every day | ORAL | 3 refills | Status: DC

## 2017-05-02 MED ORDER — AMLODIPINE BESYLATE 5 MG PO TABS: 5.0000 mg | ORAL_TABLET | Freq: Every day | ORAL | 3 refills | Status: DC

## 2017-05-02 NOTE — Progress Notes (Addendum)
Progress Note  Patient Name: Holly Acosta Date of Encounter: 05/02/2017  Primary Cardiologist: Peter Martinique, MD   Subjective   Denies any chest pain or palpitations. Breathing at baseline. Awaiting bed placement for SNF.   Inpatient Medications    Scheduled Meds: . amLODipine  5 mg Oral Daily  . aspirin EC  81 mg Oral Daily  . carvedilol  12.5 mg Oral BID WC  . insulin aspart  0-15 Units Subcutaneous TID WC  . insulin aspart  0-5 Units Subcutaneous QHS  . insulin glargine  25 Units Subcutaneous Q2200  . lisinopril  20 mg Oral Daily  . rosuvastatin  40 mg Oral Daily  . ticagrelor  90 mg Oral BID   Continuous Infusions: . sodium chloride     PRN Meds: sodium chloride, acetaminophen, acetaminophen, magnesium hydroxide, ondansetron (ZOFRAN) IV, sodium chloride flush   Vital Signs    Vitals:   05/02/17 0057 05/02/17 0241 05/02/17 0408 05/02/17 0737  BP: 132/77  125/88 135/70  Pulse: 64  64 64  Resp: 18  18 18   Temp: 98.3 F (36.8 C)  98.4 F (36.9 C) 98 F (36.7 C)  TempSrc: Oral  Oral Oral  SpO2: 94%  95% 94%  Weight:  175 lb 11.3 oz (79.7 kg)    Height:       No intake or output data in the 24 hours ending 05/02/17 1103 Filed Weights   04/30/17 0600 05/01/17 0500 05/02/17 0241  Weight: 183 lb 10.3 oz (83.3 kg) 187 lb 6.3 oz (85 kg) 175 lb 11.3 oz (79.7 kg)    Telemetry    NSR, HR in mid-50's to 60's with occasional PVC's. - Personally Reviewed  ECG    No new tracings.   Physical Exam   General: Well developed, well nourished, female appearing in no acute distress. Head: Normocephalic, atraumatic.  Neck: Supple without bruits, JVD not elevated. Lungs:  Resp regular and unlabored, CTA without wheezing or rales. Heart: RRR, S1, S2, no S3, S4, or murmur; no rub. Abdomen: Soft, non-tender, non-distended with normoactive bowel sounds. No hepatomegaly. No rebound/guarding. No obvious abdominal masses. Extremities: No clubbing, cyanosis, or lower extremity  edema. Distal pedal pulses are 2+ bilaterally. Neuro: Alert and oriented X 3. Moves all extremities spontaneously. Psych: Normal affect.  Labs    Chemistry Recent Labs  Lab 04/26/17 0406 04/28/17 0246 04/28/17 1634 05/01/17 0457  NA 134* 135 134*  --   K 4.3 4.5 4.1  --   CL 101 102 100*  --   CO2 21* 22 24  --   GLUCOSE 317* 272* 280*  --   BUN 15 14 16   --   CREATININE 0.96 0.94 0.98 0.86  CALCIUM 9.3 9.4 9.3  --   GFRNONAA >60 >60 >60 >60  GFRAA >60 >60 >60 >60  ANIONGAP 12 11 10   --      Hematology Recent Labs  Lab 04/27/17 0326 04/28/17 0246 04/29/17 0557  WBC 10.7* 13.3* 13.9*  RBC 4.58 4.88 4.70  HGB 12.9 13.9 13.2  HCT 40.2 42.7 41.0  MCV 87.8 87.5 87.2  MCH 28.2 28.5 28.1  MCHC 32.1 32.6 32.2  RDW 14.2 14.2 14.1  PLT 238 266 284    Cardiac EnzymesNo results for input(s): TROPONINI in the last 168 hours. No results for input(s): TROPIPOC in the last 168 hours.   BNPNo results for input(s): BNP, PROBNP in the last 168 hours.   DDimer No results for input(s): DDIMER in  the last 168 hours.   Radiology    No results found.  Cardiac Studies   Cardiac Catheterization: 04/27/2017  Prox RCA lesion is 25% stenosed.  Mid RCA to Dist RCA lesion is 25% stenosed.  Prox LAD lesion is 25% stenosed.  The left ventricular systolic function is normal.  LV end diastolic pressure is mildly elevated. LVEDP 18 mm Hg.  The left ventricular ejection fraction is 55-65% by visual estimate.  There is no aortic valve stenosis.  Prox Cx lesion is 99% stenosed. This was the culprit lesion.  A drug-eluting stent was successfully placed using a STENT SIERRA 3.50 X 18 MM.  Post intervention, there is a 0% residual stenosis.   Continue dual antiplatelet therapy for at least one year with aggressive secondary prevention.     I stressed the importance of compliance with her Brilinta.   Echocardiogram: 04/29/2017 Study Conclusions  - Left ventricle: The cavity  size was normal. There was moderate   concentric hypertrophy. Systolic function was normal. The   estimated ejection fraction was in the range of 55% to 60%.   Doppler parameters are consistent with abnormal left ventricular   relaxation (grade 1 diastolic dysfunction). There was no evidence   of elevated ventricular filling pressure by Doppler parameters. - Mitral valve: There was no regurgitation. Valve area by pressure   half-time: 2.08 cm^2. - Left atrium: The atrium was normal in size. - Right ventricle: Systolic function was normal. - Right atrium: The atrium was normal in size. - Tricuspid valve: There was no regurgitation. - Pericardium, extracardiac: The pericardium was normal in   appearance.  Impressions:  - There is hypokinesis of the basal inferior, inferolateral and mid   inferolateral walls, overall LVEF is preserved at 55-60%.  Patient Profile     62 y.o. female w/ PMH of HTN, HLD,Type 2 DM, and OSA who presented to [facility] ED on 04/24/2017 for evaluation of chest pain, found to have an NSTEMI and underwent cath on 04/27/2017 with DES to LCx. Reported lethargy following cath and Head CT on 4/9 showed evidence of an acute CVA.  Assessment & Plan    1. NSTEMI - Cardiac catheterization on 04/27/2017 showed severe stenosis along LCx with DES placement performed at that time.  - denies any repeat episodes of chest pain or dyspnea on exertion.  - will continue on ASA, Brilinta, BB, and statin therapy.   2. Lethargy, altered mental status, leukocytosis:  CVA - IM initially consulted due to AMS and Leukocytosis but Neurology later consulted and found to have a CVA. Head CT with evidence of acute and subacute left PICA infarct. 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 - Appreciate Neurology's recommendations. Has been cleared for discharge by the  Stroke Team. Awaiting bed placement for SNF.  3. HTN - BP has been at 125/70 - 146/88 within the past 24 hours. Allowing for permissive HTN in the setting of CVA - continue Amlodipine, Coreg, and Lisinopril at current dosing. Can further increase Amlodipine to 10mg  daily at her outpatient follow-up appointment.   4. Uncontrolled IDDM - A1c elevated to 13.3 on 04/29/17.  - has close follow-up with the Family Medicine Service on 05/06/2017.   5. HLD - FLP this admission shows total cholesterol of 173, HDL 34, LDL 107, and Triglycerides 159. - has been started on Crestor 40mg  daily. Will need repeat FLP and LFT's in 6 weeks.  For questions or updates, please contact [place] Please consult [email or web address] for contact info under Cardiology/STEMI.   Weston Brass Erma Heritage , PA-C 11:03 AM 05/02/2017 Pager: [phone]

## 2017-05-02 NOTE — Clinical Social Work Note (Signed)
CSW called [facility] and they do not have a RN to take the pt today. Facility will take th ept tomorrow, 4/14.  [place], [place]

## 2017-05-02 NOTE — Progress Notes (Signed)
Per Education officer, museum, plan is to transfer to SNF tomorrow.   Hilbert Corrigan PA Pager: [phone]

## 2017-05-03 DIAGNOSIS — R748 Abnormal levels of other serum enzymes: Secondary | ICD-10-CM

## 2017-05-03 LAB — GLUCOSE, CAPILLARY
Glucose-Capillary: 233 mg/dL — ABNORMAL HIGH (ref 65–99)
Glucose-Capillary: 289 mg/dL — ABNORMAL HIGH (ref 65–99)
Glucose-Capillary: 208 mg/dL — ABNORMAL HIGH (ref 65–99)
Glucose-Capillary: 190 mg/dL — ABNORMAL HIGH (ref 65–99)

## 2017-05-03 NOTE — Progress Notes (Signed)
Was told by co-workers that pt had vomited in room. Laverda Sorenson, RN was assisting pt when came to assess. Was told pt was coughing when pt experienced projectile vomiting. House keeping stated that vomit appeared to be composed of peaches and white stuff. Pt was given Zofran. Will continue to monitor.

## 2017-05-03 NOTE — Progress Notes (Signed)
Patient lying supine with wet washcloth covering face. States she does not feel nauseated now, is weak; however, alert and oriented X 4. Generalized weakness, no drifts. Call bell in reach, safety maintained.

## 2017-05-03 NOTE — Progress Notes (Signed)
Checked with nurse, appears to be still waiting for a bed at the SNF facility. Hopefully discharge tomorrow.   Hilbert Corrigan PA Pager: [phone]

## 2017-05-03 NOTE — Progress Notes (Signed)
Progress Note  Patient Name: Holly Acosta Date of Encounter: 05/03/2017  Primary Cardiologist: Peter Martinique, MD   Subjective   No chest pain overnight. Breathing at baseline. Ambulating around the room with assistance.   Inpatient Medications    Scheduled Meds: . amLODipine  5 mg Oral Daily  . aspirin EC  81 mg Oral Daily  . carvedilol  12.5 mg Oral BID WC  . insulin aspart  0-15 Units Subcutaneous TID WC  . insulin aspart  0-5 Units Subcutaneous QHS  . insulin glargine  40 Units Subcutaneous Q2200  . lisinopril  20 mg Oral Daily  . rosuvastatin  40 mg Oral Daily  . ticagrelor  90 mg Oral BID   Continuous Infusions: . sodium chloride     PRN Meds: sodium chloride, acetaminophen, acetaminophen, magnesium hydroxide, ondansetron (ZOFRAN) IV, sodium chloride flush   Vital Signs    Vitals:   05/02/17 1642 05/02/17 2025 05/02/17 2330 05/03/17 0344  BP: (!) 147/77 (!) 152/89 130/88 (!) 144/80  Pulse: 61 64 60 84  Resp: 18 16 16 16   Temp: 97.9 F (36.6 C) (!) 97.5 F (36.4 C) 98.2 F (36.8 C) 98 F (36.7 C)  TempSrc: Oral Oral Oral Oral  SpO2: 91% 95% 93% 96%  Weight:      Height:       No intake or output data in the 24 hours ending 05/03/17 0709 Filed Weights   04/30/17 0600 05/01/17 0500 05/02/17 0241  Weight: 183 lb 10.3 oz (83.3 kg) 187 lb 6.3 oz (85 kg) 175 lb 11.3 oz (79.7 kg)    Telemetry    NSR, HR in 60's to 70's with occasional PVC's.  - Personally Reviewed  ECG    No new tracings.   Physical Exam   General: Well developed, well nourished female appearing in no acute distress. Head: Normocephalic, atraumatic.  Neck: Supple without bruits, JVD not elevated. Lungs:  Resp regular and unlabored, CTA without wheezing or rales. Heart: RRR, S1, S2, no S3, S4, or murmur; no rub. Abdomen: Soft, non-tender, non-distended with normoactive bowel sounds. No hepatomegaly. No rebound/guarding. No obvious abdominal masses. Extremities: No clubbing,  cyanosis, or lower extremity edema. Distal pedal pulses are 2+ bilaterally. Neuro: Alert and oriented X 3. Moves all extremities spontaneously. Psych: Normal affect.  Labs    Chemistry Recent Labs  Lab 04/28/17 0246 04/28/17 1634 05/01/17 0457  NA 135 134*  --   K 4.5 4.1  --   CL 102 100*  --   CO2 22 24  --   GLUCOSE 272* 280*  --   BUN 14 16  --   CREATININE 0.94 0.98 0.86  CALCIUM 9.4 9.3  --   GFRNONAA >60 >60 >60  GFRAA >60 >60 >60  ANIONGAP 11 10  --      Hematology Recent Labs  Lab 04/27/17 0326 04/28/17 0246 04/29/17 0557  WBC 10.7* 13.3* 13.9*  RBC 4.58 4.88 4.70  HGB 12.9 13.9 13.2  HCT 40.2 42.7 41.0  MCV 87.8 87.5 87.2  MCH 28.2 28.5 28.1  MCHC 32.1 32.6 32.2  RDW 14.2 14.2 14.1  PLT 238 266 284    Cardiac EnzymesNo results for input(s): TROPONINI in the last 168 hours. No results for input(s): TROPIPOC in the last 168 hours.   BNPNo results for input(s): BNP, PROBNP in the last 168 hours.   DDimer No results for input(s): DDIMER in the last 168 hours.   Radiology    No  results found.  Cardiac Studies   Cardiac Catheterization: 04/27/2017  Prox RCA lesion is 25% stenosed.  Mid RCA to Dist RCA lesion is 25% stenosed.  Prox LAD lesion is 25% stenosed.  The left ventricular systolic function is normal.  LV end diastolic pressure is mildly elevated. LVEDP 18 mm Hg.  The left ventricular ejection fraction is 55-65% by visual estimate.  There is no aortic valve stenosis.  Prox Cx lesion is 99% stenosed. This was the culprit lesion.  A drug-eluting stent was successfully placed using a STENT SIERRA 3.50 X 18 MM.  Post intervention, there is a 0% residual stenosis.   Continue dual antiplatelet therapy for at least one year with aggressive secondary prevention.     I stressed the importance of compliance with her Brilinta.   Limited Echo: 04/29/2017 Study Conclusions  - Left ventricle: The cavity size was normal. There was  moderate   concentric hypertrophy. Systolic function was normal. The   estimated ejection fraction was in the range of 55% to 60%.   Doppler parameters are consistent with abnormal left ventricular   relaxation (grade 1 diastolic dysfunction). There was no evidence   of elevated ventricular filling pressure by Doppler parameters. - Mitral valve: There was no regurgitation. Valve area by pressure   half-time: 2.08 cm^2. - Left atrium: The atrium was normal in size. - Right ventricle: Systolic function was normal. - Right atrium: The atrium was normal in size. - Tricuspid valve: There was no regurgitation. - Pericardium, extracardiac: The pericardium was normal in   appearance.  Impressions:  - There is hypokinesis of the basal inferior, inferolateral and mid   inferolateral walls, overall LVEF is preserved at 55-60%.  Patient Profile     62 y.o. female w/ PMH of HTN, HLD,Type 2 DM, and OSA who presented to [facility] ED on 04/24/2017 for evaluation of chest pain, found to have an NSTEMI and underwent cath on 04/27/2017 with DES to LCx. Reported lethargy following cath and Head CT on 4/9 showed evidence of an acute CVA.   Assessment & Plan    1. NSTEMI - Cardiac catheterization on 04/27/2017 showed severe stenosis along LCx with DES placement performed at that time.  - no repeat anginal symptoms following her procedure. No significant arrhythmias noted on telemetry.  - will continue on ASA, Brilinta, BB, and statin therapy.   2. Lethargy, altered mental status, leukocytosis: CVA - IM initially consulted due to AMS and Leukocytosis but Neurology later consulted and found to have a CVA following her catheterization. Head CT withevidence of acute and subacute left PICA infarct. 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 - Appreciate Neurology's recommendations.  Continue ASA and statin therapy. To transfer to SNF later today.   3. HTN - BP has been stable at 130/70 - 152/89 within the past 24 hours. Allowing for permissive HTN in the setting of CVA - continue Amlodipine, Coreg, and Lisinopril at current dosing. Can further increase Amlodipine to 10mg  daily at her outpatient follow-up appointment.   4. Uncontrolled IDDM - A1c elevated to 13.3 on 04/29/17. Appreciate Diabetes Coordinator recommendations. Lantus titrated yesterday.   5. HLD - FLP shows total cholesterol of 173, HDL 34, LDL 107, and Triglycerides 159. Goal LDL < 70 with known CAD and CVA.  - has been started on Crestor 40mg  daily. Will need repeat FLP and LFT's in 6 weeks.    For  questions or updates, please contact [place] Please consult [email or web address] for contact info under Cardiology/STEMI.   Signed, Erma Heritage , PA-C 7:09 AM 05/03/2017 Pager: [phone]

## 2017-05-04 LAB — GLUCOSE, CAPILLARY
GLUCOSE-CAPILLARY: 158 mg/dL — AB (ref 65–99)
GLUCOSE-CAPILLARY: 175 mg/dL — AB (ref 65–99)

## 2017-05-04 NOTE — Care Management Note (Signed)
Case Management Note  Patient Details  Name: KIARALIZ RAFUSE MRN: [id number] Date of Birth: September 01, 1955  Subjective/Objective:                    Action/Plan: Pt discharging to [facility] SNF. CM signing off.    Expected Discharge Date:  05/04/17               Expected Discharge Plan:  [place]  In-House Referral:  NA  Discharge planning Services  CM Consult, Medication Assistance  Post Acute Care Choice:  Durable Medical Equipment, Home Health Choice offered to:  Adult Children  DME Arranged:  Walker rolling with seat DME Agency:  [facility] [place] Arranged:  OT, PT [place] Agency:  [place]  Status of Service:  Completed, signed off  If discussed at [place] of Stay Meetings, dates discussed:    Additional Comments:  Pollie Friar, RN 05/04/2017, 3:59 PM

## 2017-05-04 NOTE — Progress Notes (Signed)
Patient having some N/V symptoms being managed with Zofran. Reassessed patient resting comfortably.

## 2017-05-04 NOTE — Progress Notes (Signed)
Physical Therapy Treatment Patient Details Name: Holly Acosta MRN: [id number] DOB: October 22, 1955 Today's Date: 05/04/2017    History of Present Illness 62 y.o. female with a PMH HTN, HLD, DM type 2, OSA, and arthritis, who presents with CP. DES to proximal Cx. 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     PT Comments    Patient was very drowsy throughout session. Pt with no c/o nausea or dizziness. Pt tolerated short distance gait in room with min/mod A for balance and for safe management of AD.  Continue to progress as tolerated with anticipated d/c to SNF for further skilled PT services.    Follow Up Recommendations  SNF;Supervision/Assistance - 24 hour     Equipment Recommendations  Other (comment)    Recommendations for Other Services       Precautions / Restrictions Precautions Precautions: Fall Restrictions Weight Bearing Restrictions: No    Mobility  Bed Mobility Overal bed mobility: Needs Assistance Bed Mobility: Supine to Sit;Sit to Supine     Supine to sit: Supervision Sit to supine: Supervision   General bed mobility comments: supervision for safety  Transfers Overall transfer level: Needs assistance Equipment used: Rolling walker (2 wheeled) Transfers: Sit to/from Stand Sit to Stand: Min assist;Mod assist         General transfer comment: min A from EOB and mod A from commode (lower surface); cues for safe hand placement/technique  Ambulation/Gait Ambulation/Gait assistance: Min assist;Mod assist Ambulation Distance (Feet): (107ft X2) Assistive device: Rolling walker (2 wheeled) Gait Pattern/deviations: Step-through pattern;Trunk flexed;Decreased step length - right;Decreased step length - left;Decreased dorsiflexion - right Gait velocity: decreased   General Gait Details: min A initially with cues for proximity/use of RW and mod A returning from bathroom back to bed due to  increased unsteadiness and impaired gait mechanics; pt scissoring with turns and assist required for balance and managing RW safely   Stairs             Wheelchair Mobility    Modified Rankin (Stroke Patients Only)       Balance Overall balance assessment: Needs assistance Sitting-balance support: Bilateral upper extremity supported;Feet supported Sitting balance-Leahy Scale: Good     Standing balance support: Bilateral upper extremity supported;During functional activity Standing balance-Leahy Scale: Poor                              Cognition Arousal/Alertness: Lethargic Behavior During Therapy: Flat affect Overall Cognitive Status: No family/caregiver present to determine baseline cognitive functioning Area of Impairment: Attention;Following commands;Safety/judgement;Awareness;Problem solving                   Current Attention Level: Focused   Following Commands: Follows one step commands with increased time Safety/Judgement: Decreased awareness of safety;Decreased awareness of deficits Awareness: Emergent Problem Solving: Decreased initiation;Slow processing;Difficulty sequencing;Requires verbal cues General Comments: slow processing and limited insight, but [facility] for basic tasks      Exercises      General Comments        Pertinent Vitals/Pain Pain Assessment: Faces Faces Pain Scale: Hurts a little bit Pain Location: B knees Pain Descriptors / Indicators: Discomfort Pain Intervention(s): Limited activity within patient's tolerance;Monitored during session;Repositioned    Home Living                      Prior Function  PT Goals (current goals can now be found in the care plan section) Acute Rehab PT Goals Patient Stated Goal: to go home PT Goal Formulation: With patient Time For Goal Achievement: 05/12/17 Potential to Achieve Goals: Good Progress towards PT goals: Progressing toward goals     Frequency    Min 3X/week      PT Plan Current plan remains appropriate;Frequency needs to be updated    Co-evaluation              AM-PAC PT "6 Clicks" Daily Activity  Outcome Measure  Difficulty turning over in bed (including adjusting bedclothes, sheets and blankets)?: A Little Difficulty moving from lying on back to sitting on the side of the bed? : A Little Difficulty sitting down on and standing up from a chair with arms (e.g., wheelchair, bedside commode, etc,.)?: Unable Help needed moving to and from a bed to chair (including a wheelchair)?: A Little Help needed walking in hospital room?: A Lot Help needed climbing 3-5 steps with a railing? : Total 6 Click Score: 13    End of Session Equipment Utilized During Treatment: Gait belt Activity Tolerance: Patient limited by lethargy;Patient limited by fatigue Patient left: in bed;with call bell/phone within reach;with bed alarm set Nurse Communication: Mobility status PT Visit Diagnosis: Other abnormalities of gait and mobility (R26.89)     Time: 5409-8119 PT Time Calculation (min) (ACUTE ONLY): 21 min  Charges:  $Gait Training: 8-22 mins                    G Codes:       Earney Navy, PTA Pager: [phone]     Darliss Cheney 05/04/2017, 12:47 PM

## 2017-05-04 NOTE — Telephone Encounter (Signed)
Current admit 

## 2017-05-04 NOTE — Progress Notes (Signed)
Report given to nurse at [facility], patients belonging packed up and IV catheter removed, and tele  notified  andmonitor removed.

## 2017-05-04 NOTE — Clinical Social Work Placement (Signed)
   CLINICAL SOCIAL WORK PLACEMENT  NOTE  Date:  05/04/2017  Patient Details  Name: Holly Acosta MRN: [id number] Date of Birth: 12-27-1955  Clinical Social Work is seeking post-discharge placement for this patient at the [place] level of care (*CSW will initial, date and re-position this form in  chart as items are completed):  Yes   Patient/family provided with [place] Work Department's list of facilities offering this level of care within the geographic area requested by the patient (or if unable, by the patient's family).  Yes   Patient/family informed of their freedom to choose among providers that offer the needed level of care, that participate in Medicare, Medicaid or managed care program needed by the patient, have an available bed and are willing to accept the patient.  Yes   Patient/family informed of [place] [facility]'s ownership interest in [facility] and [facility], as well as of the fact that they are under no obligation to receive care at these facilities.  PASRR submitted to EDS on 04/29/17     PASRR number received on 04/29/17     Existing PASRR number confirmed on       FL2 transmitted to all facilities in geographic area requested by pt/family on 04/29/17     FL2 transmitted to all facilities within larger geographic area on       Patient informed that his/her managed care company has contracts with or will negotiate with certain facilities, including the following:        Yes   Patient/family informed of bed offers received.  Patient chooses bed at [facility]     Physician recommends and patient chooses bed at      Patient to be transferred to [facility] on 05/04/17.  Patient to be transferred to facility by ptar     Patient family notified on 05/04/17 of transfer.  Name of family member notified:  [name]     PHYSICIAN Please sign FL2     Additional Comment:     _______________________________________________ Jorge Ny, LCSW 05/04/2017, 1:06 PM

## 2017-05-04 NOTE — Progress Notes (Signed)
Called to give report at [facility] not available at the time, spoke with [facility] who will give contact to receive report .

## 2017-05-04 NOTE — Progress Notes (Signed)
CSW spoke with [facility]- they are prepared to take patient today if stable to DC- will need updated DC summary with todays date  Jorge Ny, [place] Social Worker [phone]

## 2017-05-05 NOTE — Telephone Encounter (Signed)
Patient has been discharged to [facility]

## 2017-05-05 NOTE — Telephone Encounter (Signed)
No TCM/TOC outreach necessary since discharged to facility

## 2017-05-06 ENCOUNTER — Inpatient Hospital Stay: Payer: Medicaid Other | Admitting: Internal Medicine

## 2017-05-06 ENCOUNTER — Telehealth (HOSPITAL_COMMUNITY): Payer: Self-pay

## 2017-05-06 NOTE — Telephone Encounter (Signed)
Patients insurance is active through [place]. Faxed over Medicaid Reimbursement form to [place].   Will contact patient to see if she is interested in the Cardiac Rehab Program. If interested, patient will need to completed follow up appt. Once completed, patient will be contacted for scheduling upon review by the RN Navigator.

## 2017-05-06 NOTE — Telephone Encounter (Signed)
Called patient to see if she is interested in the Cardiac Rehab Program. Patient is interested. Explained scheduling process to patient and she verbalized understanding.

## 2017-05-06 NOTE — Progress Notes (Signed)
Cardiology Office Note   Date:  05/07/2017   ID:  Kiyona, Mcnall 08-23-1955, MRN [id number]  PCP:  Sela Hua, MD  Cardiologist: Dr. Martinique   Chief Complaint  Patient presents with  . Transitions Of Care  . Coronary Artery Disease  . Cerebrovascular Accident     History of Present Illness: Holly Acosta is a 62 y.o. female who presents for TOC follow up after admission for NSTEMI with troponin peaking at > 65.0. Patient underwent cardiac cath on 04/27/2017 demonstrating severe stenosis of the LCX requiring DES. She was placed on ASA, Brilinta and statin therapy.   During admission she also developed lethargy and leukocytosis, and was found to have a CVA. MRI brain with moderate sized acute ischemic left cerebellar infarct without frank hemorrhagic transformationand acute ischemic nonhemorrhagic right cerebellar infarct. She was discharged to SNF for rehab. Diabetes was found to be uncontrolled and she will followed by PCP for ongoing management.   She is here today without any cardiac complaints.  She is somnolent but responds appropriately but falls asleep easily sitting in her wheelchair.  She is only begun to participate in physical therapy, and states that she wants to become stronger and she is happy to participate in it at this time.  Her only complaint is that she continues to sleep.  Denies shortness of breath or exertional chest discomfort during physical therapy.  She denies any bleeding issues on Brilinta.  She is medically compliant as medications are provided by skilled nursing facility.   Past Medical History:  Diagnosis Date  . Acute cerebrovascular accident (CVA) of cerebellum ([place]) 04/29/2017  . Anemia   . Arthritis of knee    "both knees" (04/24/2017)  . Coronary artery disease involving native coronary artery of native heart with unstable angina pectoris ([place]) 04/30/2017  . Diabetes mellitus, type 2 ([place])   . History of blood transfusion    "related to prolapsed  uterus"  . HLD (hyperlipidemia)   . HTN (hypertension)   . Non-ST elevation (NSTEMI) myocardial infarction ([place])   . Prolapsed uterus   . Sleep apnea    "have mask; haven't worn it for a long time" (04/24/2017)  . Status post coronary artery stent placement     Past Surgical History:  Procedure Laterality Date  . CARDIAC CATHETERIZATION  2011  . CORONARY STENT INTERVENTION N/A 04/27/2017   Procedure: CORONARY STENT INTERVENTION;  Surgeon: Jettie Booze, MD;  Location: [facility];  Service: Cardiovascular;  Laterality: N/A;  . LEFT HEART CATH AND CORONARY ANGIOGRAPHY N/A 04/27/2017   Procedure: LEFT HEART CATH AND CORONARY ANGIOGRAPHY;  Surgeon: Jettie Booze, MD;  Location: [facility];  Service: Cardiovascular;  Laterality: N/A;  . TUBAL LIGATION       Current Outpatient Medications  Medication Sig Dispense Refill  . acetaminophen (TYLENOL) 500 MG tablet Take 1,000 mg by mouth every 12 (twelve) hours as needed for mild pain.     Marland Kitchen amLODipine (NORVASC) 5 MG tablet Take 1 tablet (5 mg total) by mouth daily. 90 tablet 3  . aspirin EC 81 MG tablet Take 1 tablet (81 mg total) by mouth daily. 90 tablet 3  . Blood Glucose Monitoring Suppl (ACCU-CHEK NANO SMARTVIEW) w/Device KIT Use to test blood sugar up to 3 times a day. ICD-10 code: E11.9 1 kit 0  . carvedilol (COREG) 12.5 MG tablet TAKE 1 TABLET BY MOUTH TWICE A DAY WITH MEALS 180 tablet 0  .  fluconazole (DIFLUCAN) 150 MG tablet Take 150 mg by mouth daily.    Marland Kitchen glucose blood (ACCU-CHEK AVIVA) test strip Use as instructed 100 each 12  . glucose blood (ACCU-CHEK SMARTVIEW) test strip Use as instructed 100 each 12  . lisinopril (PRINIVIL,ZESTRIL) 20 MG tablet Take 1 tablet (20 mg total) by mouth daily. 90 tablet 3  . metFORMIN (GLUCOPHAGE) 1000 MG tablet TAKE 1 TABLET BY MOUTH TWICE DAILY WITH A MEAL Resume on 04/30/17. 60 tablet 2  . rosuvastatin (CRESTOR) 40 MG tablet Take 1 tablet (40 mg total) by mouth daily. 90  tablet 3  . ticagrelor (BRILINTA) 90 MG TABS tablet Take 1 tablet (90 mg total) by mouth 2 (two) times daily. 180 tablet 3  . TRULICITY 2.75 TZ/0.0FV SOPN INJECT 0.75 MG INTO THE SKIN ONCE A WEEK. 2 pen 0  . ACCU-CHEK FASTCLIX LANCETS MISC 1 each by Does not apply route daily. Check sugar daily as needed 102 each 3  . LANTUS SOLOSTAR 100 UNIT/ML Solostar Pen INJECT 50 UNITS INTO THE SKIN IN THE MORNING (Patient not taking: Reported on 05/07/2017) 45 pen 0  . NOVOLOG FLEXPEN 100 UNIT/ML FlexPen INJECT 20 UNITS INTO THE SKIN 3 TIMES DAILY WITH MEALS. (Patient not taking: Reported on 05/07/2017) 15 pen 2   No current facility-administered medications for this visit.     Allergies:   Lipitor [atorvastatin] and Diclofenac    Social History:  The patient  reports that she quit smoking about 10 years ago. Her smoking use included cigarettes. She started smoking about 44 years ago. She has a 33.00 pack-year smoking history. She has never used smokeless tobacco. She reports that she does not drink alcohol or use drugs.   Family History:  The patient's family history includes Diabetes in her mother; Hypertension in her father and mother.    ROS: All other systems are reviewed and negative. Unless otherwise mentioned in H&P    PHYSICAL EXAM: VS:  BP 113/76   Pulse 70   Ht '5\' 5"'$  (1.651 m)   BMI 29.90 kg/m  , BMI Body mass index is 29.9 kg/m. GEN: Well nourished, well developed, in no acute distress  HEENT: normal  Neck: no JVD, carotid bruits, or masses Cardiac: RRR; no murmurs, rubs, or gallops,no edema  Respiratory:  clear to auscultation bilaterally, normal work of breathing GI: soft, nontender, nondistended, + BS MS: no deformity or atrophy cath site is no evidence of bleeding or ecchymosis.  Generalized frailty. Skin: warm and dry, no rash Neuro:  Strength and sensation are intact, she is somnolent and falls asleep easily when not responding to questions. Psych: euthymic mood, full  affect   EKG: Normal sinus rhythm, rate of 70 bpm, nonspecific T wave abnormality, slightly prolonged QT interval 452 ms.  Essentially unchanged since EKG on 04/27/2017 during hospitalization.  Recent Labs: 04/24/2017: B Natriuretic Peptide 31.0 04/28/2017: BUN 16; Potassium 4.1; Sodium 134; TSH 0.422 04/29/2017: Hemoglobin 13.2; Platelets 284 05/01/2017: Creatinine, Ser 0.86    Lipid Panel    Component Value Date/Time   CHOL 173 04/29/2017 0557   TRIG 159 (H) 04/29/2017 0557   HDL 34 (L) 04/29/2017 0557   CHOLHDL 5.1 04/29/2017 0557   VLDL 32 04/29/2017 0557   LDLCALC 107 (H) 04/29/2017 0557   LDLDIRECT 135 (H) 12/31/2012 0949      Wt Readings from Last 3 Encounters:  05/04/17 179 lb 10.8 oz (81.5 kg)  07/31/16 192 lb (87.1 kg)  05/08/16 193 lb (87.5 kg)  Other studies Reviewed: Cardiac cath 04/27/2017     Prox RCA lesion is 25% stenosed.  Mid RCA to Dist RCA lesion is 25% stenosed.  Prox LAD lesion is 25% stenosed.  The left ventricular systolic function is normal.  LV end diastolic pressure is mildly elevated. LVEDP 18 mm Hg.  The left ventricular ejection fraction is 55-65% by visual estimate.  There is no aortic valve stenosis.  Prox Cx lesion is 99% stenosed. This was the culprit lesion.  A drug-eluting stent was successfully placed using a STENT SIERRA 3.50 X 18 MM.  Post intervention, there is a 0% residual stenosis.   Continue dual antiplatelet therapy for at least one year with aggressive secondary prevention.      Echocardiogram 04/29/2017 Left ventricle: The cavity size was normal. There was moderate   concentric hypertrophy. Systolic function was normal. The   estimated ejection fraction was in the range of 55% to 60%.   Doppler parameters are consistent with abnormal left ventricular   relaxation (grade 1 diastolic dysfunction). There was no evidence   of elevated ventricular filling pressure by Doppler parameters. - Mitral valve: There was  no regurgitation. Valve area by pressure   half-time: 2.08 cm^2. - Left atrium: The atrium was normal in size. - Right ventricle: Systolic function was normal. - Right atrium: The atrium was normal in size. - Tricuspid valve: There was no regurgitation. - Pericardium, extracardiac: The pericardium was normal in   appearance.  Impressions:  - There is hypokinesis of the basal inferior, inferolateral and mid   inferolateral walls, overall LVEF is preserved at 55-60%.  MRI 04/29/2017 IMPRESSION: 1. 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 Associated mild petechial hemorrhage without frank hemorrhagic transformation. Localized edema with mild mass effect on the fourth ventricular outflow tract which remains patent at this time. No hydrocephalus. 2. Additional 8 mm acute ischemic nonhemorrhagic right cerebellar infarct. 3. Moderate nonspecific cerebral white matter changes, most likely related to chronic small vessel ischemic disease.   ASSESSMENT AND PLAN:   1.  Coronary artery disease: Status post and STEMI requiring cardiac catheterization and percutaneous intervention with drug-eluting stent to 99% proximal circumflex lesion.  She continues on dual antiplatelet therapy with ticagrelor 90 mg twice daily, and aspirin.  She also remains on beta-blocker therapy with carvedilol 12.5 mg twice daily, ACE inhibitor lisinopril 20 mg daily, and statin therapy rosuvastatin 40 mg daily.  She offers no complaints of breathing issues, bleeding, or myalgias associated with any medications listed above.  We will continue current medication regimen and follow with her in 1 month for ongoing management and surveillance.  2.  History of nonhemorrhagic left cerebellar CVA: Continues to be followed by neurology, she is undergoing physical rehab at skilled nursing facility.  We will not make any changes on her medication regimen.  She will have follow-up labs prior to  her next office visit.  3.  Hypertension: Excellent control of blood pressure.  No changes in regimen.  She will continue ACE, beta-blocker.   Current medicines are reviewed at length with the patient today.    Labs/ tests ordered today include: BMET and CBC  Phill Myron. West Pugh, ANP, AACC   05/07/2017 11:52 AM    [facility] [address], [place], [place] [address] Phone: [phone]; Fax: [phone]

## 2017-05-07 ENCOUNTER — Encounter: Payer: Self-pay | Admitting: Internal Medicine

## 2017-05-07 ENCOUNTER — Encounter: Payer: Self-pay | Admitting: Adult Health

## 2017-05-07 ENCOUNTER — Ambulatory Visit (INDEPENDENT_AMBULATORY_CARE_PROVIDER_SITE_OTHER): Payer: Medicaid Other | Admitting: Internal Medicine

## 2017-05-07 ENCOUNTER — Other Ambulatory Visit: Payer: Self-pay

## 2017-05-07 ENCOUNTER — Ambulatory Visit (INDEPENDENT_AMBULATORY_CARE_PROVIDER_SITE_OTHER): Payer: Medicaid Other | Admitting: Adult Health

## 2017-05-07 VITALS — BP 113/76 | HR 70 | Ht 65.0 in

## 2017-05-07 DIAGNOSIS — E7800 Pure hypercholesterolemia, unspecified: Secondary | ICD-10-CM

## 2017-05-07 DIAGNOSIS — Z23 Encounter for immunization: Secondary | ICD-10-CM

## 2017-05-07 DIAGNOSIS — I639 Cerebral infarction, unspecified: Secondary | ICD-10-CM

## 2017-05-07 DIAGNOSIS — E1165 Type 2 diabetes mellitus with hyperglycemia: Secondary | ICD-10-CM

## 2017-05-07 DIAGNOSIS — I25110 Atherosclerotic heart disease of native coronary artery with unstable angina pectoris: Secondary | ICD-10-CM | POA: Diagnosis not present

## 2017-05-07 DIAGNOSIS — I214 Non-ST elevation (NSTEMI) myocardial infarction: Secondary | ICD-10-CM

## 2017-05-07 DIAGNOSIS — I10 Essential (primary) hypertension: Secondary | ICD-10-CM

## 2017-05-07 DIAGNOSIS — Z79899 Other long term (current) drug therapy: Secondary | ICD-10-CM | POA: Diagnosis not present

## 2017-05-07 NOTE — Patient Instructions (Signed)
It was so nice to see you today!  I'm sorry about your heart attack and stroke.  Please come back to see me in clinic after you are discharged from the nursing facility  -Dr. Brett Albino

## 2017-05-07 NOTE — Patient Instructions (Signed)
Labwork: BMET AND CBC TODAY HERE IN OUR OFFICE AT [facility]  Take the provided lab slips with you to the lab for your blood draw.   Follow-Up: Your physician wants you to follow-up in: 1 MONTH WITH DR Martinique -OR- KATHRYN LAWRENCE ([place]), DNP,AAA IF PRIMARY CARDIOLOGIST IS UNAVAILABLE.    Thank you for choosing [facility] at [facility]!!

## 2017-05-08 LAB — BASIC METABOLIC PANEL
BUN/Creatinine Ratio: 17 (ref 12–28)
BUN: 19 mg/dL (ref 8–27)
CO2: 24 mmol/L (ref 20–29)
CREATININE: 1.1 mg/dL — AB (ref 0.57–1.00)
Calcium: 9.7 mg/dL (ref 8.7–10.3)
Chloride: 95 mmol/L — ABNORMAL LOW (ref 96–106)
GFR calc Af Amer: 63 mL/min/{1.73_m2} (ref >59)
GFR calc non Af Amer: 54 mL/min/{1.73_m2} — ABNORMAL LOW (ref >59)
GLUCOSE: 229 mg/dL — AB (ref 65–99)
Potassium: 4.6 mmol/L (ref 3.5–5.2)
Sodium: 136 mmol/L (ref 134–144)

## 2017-05-08 LAB — CBC
HEMATOCRIT: 46.9 % — AB (ref 34.0–46.6)
HEMOGLOBIN: 15.7 g/dL (ref 11.1–15.9)
MCH: 28.4 pg (ref 26.6–33.0)
MCHC: 33.5 g/dL (ref 31.5–35.7)
MCV: 85 fL (ref 79–97)
Platelets: 368 10*3/uL (ref 150–379)
RBC: 5.52 x10E6/uL — AB (ref 3.77–5.28)
RDW: 14.2 % (ref 12.3–15.4)
WBC: 10.1 10*3/uL (ref 3.4–10.8)

## 2017-05-08 NOTE — Assessment & Plan Note (Signed)
>>  ASSESSMENT AND PLAN FOR UNCONTROLLED TYPE 2 DIABETES MELLITUS WITH HYPERGLYCEMIA (HCC) WRITTEN ON 05/08/2017 12:48 AM BY MAYO, KATY DODD, MD  Uncontrolled. Last A1c 13.3 on 04/29/17. CBGs being checked at SNF. Patient reports these are "normal" but does not have a log and she has not been checking them herself.  - Continue Metformin 1000mg  bid, Lantus 50 units daily, and Trulicity 0.75mg  weekly - Wrote on assessment form for SNF that they should increase Trulcity to 1.5mg  weekly if blood sugars are persistently >200. - Follow-up with me after she is discharged from SNF

## 2017-05-08 NOTE — Assessment & Plan Note (Signed)
Uncontrolled. Last A1c 13.3 on 04/29/17. CBGs being checked at SNF. Patient reports these are "normal" but does not have a log and she has not been checking them herself.  - Continue Metformin 1000mg  bid, Lantus 50 units daily, and Trulicity 0.75mg  weekly - Wrote on assessment form for SNF that they should increase Trulcity to 1.5mg  weekly if blood sugars are persistently >200. - Follow-up with me after she is discharged from SNF

## 2017-05-08 NOTE — Assessment & Plan Note (Signed)
Seems to have minimal residual focal deficits on exam today, although patient does not generalized weakness and deconditioning. - Continue Crestor and Aspirin - Follow-up appointment scheduled with Neurology

## 2017-05-08 NOTE — Assessment & Plan Note (Signed)
Doing well. - Continue Aspirin, Brilinta, Crestor, and Coreg - Follow-up appointment scheduled with Cardiology

## 2017-05-08 NOTE — Progress Notes (Signed)
   [facility] Phone: [phone]  Subjective:  Holly Acosta is a 62 year old female presenting to clinic for hospital follow-up. She was hospitalized from 04/24/17-05/04/17 with NSTEMI. Cardiac cath performed 04/27/17 showed severe stenosis along the left circumflex with DES x 1 placed. She was started on Aspirin, Brilinta, BB, and statin. On the day of the cardiac cath, she was found to be lethargic with AMS. MRI brain showed an acute ischemic right and left cerebellar infarcts. She underwent full stroke work-up. She was discharged from the hospital to SNF to undergo additional rehab before returning home. At the SNF, she has been getting her medications as prescribed. They have been checking her blood sugars 2-3 times per day. She was told they were high when she first got there, but they have been normal recently. She can't remember what values they have been. No dizziness, no headaches, no chest pain, no shortness of breath, no lower extremity edema.  ROS: See HPI for pertinent positives and negatives  Past Medical History- CAD s/p DES x 1 to LCx, hx CVA, HTN, T2DM, HLD, OSA  Family history reviewed for today's visit. No changes.  Social history- patient is a former smoker, quit in 2008  Objective: BP 110/62   Pulse 93   Temp 97.7 F (36.5 C) (Oral)   SpO2 97%  Gen: NAD, tired-appearing but alert HEENT: NCAT, EOMI, MMM Neck: FROM, supple CV: RRR, no murmur  Resp: CTABL, no wheezes, normal work of breathing GI: SNTND, BS present, no guarding or organomegaly Msk: No edema, warm, normal tone, moves UE/LE spontaneously Neuro: Alert and oriented, 5/5 muscle strength in upper and lower extremities bilaterally, sensation intact to light touch throughout, normal finger-to-nose testing bilaterally  Assessment/Plan: CVA: Seems to have minimal residual focal deficits on exam today, although patient does not generalized weakness and deconditioning. - Continue Crestor and  Aspirin - Follow-up appointment scheduled with Neurology  NSTEMI s/p DES to LCx: Doing well. - Continue Aspirin, Brilinta, Crestor, and Coreg - Follow-up appointment scheduled with Cardiology  T2DM: Uncontrolled. Last A1c 13.3 on 04/29/17. CBGs being checked at SNF. Patient reports these are "normal" but does not have a log and she has not been checking them herself.  - Continue Metformin 1000mg  bid, Lantus 50 units daily, and Trulicity 0.75mg  weekly - Wrote on assessment form for SNF that they should increase Trulcity to 1.5mg  weekly if blood sugars are persistently >200. - Follow-up with me after she is discharged from [facility]   Hyman Bible, MD PGY-3

## 2017-05-14 ENCOUNTER — Emergency Department (HOSPITAL_COMMUNITY): Payer: Medicaid Other

## 2017-05-14 ENCOUNTER — Emergency Department (HOSPITAL_COMMUNITY)
Admission: EM | Admit: 2017-05-14 | Discharge: 2017-05-14 | Disposition: A | Discharge status: Home / Self Care | Payer: Medicaid Other | Attending: Physician Assistant | Admitting: Physician Assistant

## 2017-05-14 DIAGNOSIS — Z79899 Other long term (current) drug therapy: Secondary | ICD-10-CM | POA: Diagnosis not present

## 2017-05-14 DIAGNOSIS — R110 Nausea: Secondary | ICD-10-CM | POA: Insufficient documentation

## 2017-05-14 DIAGNOSIS — I10 Essential (primary) hypertension: Secondary | ICD-10-CM | POA: Insufficient documentation

## 2017-05-14 DIAGNOSIS — Z87891 Personal history of nicotine dependence: Secondary | ICD-10-CM | POA: Insufficient documentation

## 2017-05-14 DIAGNOSIS — I2510 Atherosclerotic heart disease of native coronary artery without angina pectoris: Secondary | ICD-10-CM | POA: Diagnosis not present

## 2017-05-14 DIAGNOSIS — E119 Type 2 diabetes mellitus without complications: Secondary | ICD-10-CM | POA: Insufficient documentation

## 2017-05-14 DIAGNOSIS — Z7984 Long term (current) use of oral hypoglycemic drugs: Secondary | ICD-10-CM | POA: Diagnosis not present

## 2017-05-14 DIAGNOSIS — Z7982 Long term (current) use of aspirin: Secondary | ICD-10-CM | POA: Diagnosis not present

## 2017-05-14 LAB — COMPREHENSIVE METABOLIC PANEL
ALBUMIN: 2.9 g/dL — AB (ref 3.5–5.0)
ALT: 26 U/L (ref 14–54)
AST: 26 U/L (ref 15–41)
Alkaline Phosphatase: 68 U/L (ref 38–126)
Anion gap: 9 (ref 5–15)
BUN: 10 mg/dL (ref 6–20)
CHLORIDE: 102 mmol/L (ref 101–111)
CO2: 24 mmol/L (ref 22–32)
CREATININE: 1.03 mg/dL — AB (ref 0.44–1.00)
Calcium: 8.6 mg/dL — ABNORMAL LOW (ref 8.9–10.3)
GFR calc Af Amer: >60 mL/min (ref >60)
GFR, EST NON AFRICAN AMERICAN: 57 mL/min — AB (ref >60)
GLUCOSE: 99 mg/dL (ref 65–99)
POTASSIUM: 3.5 mmol/L (ref 3.5–5.1)
Sodium: 135 mmol/L (ref 135–145)
Total Bilirubin: 0.6 mg/dL (ref 0.3–1.2)
Total Protein: 6.3 g/dL — ABNORMAL LOW (ref 6.5–8.1)

## 2017-05-14 LAB — CBC WITH DIFFERENTIAL/PLATELET
BASOS ABS: 0 10*3/uL (ref 0.0–0.1)
BASOS PCT: 0 %
Eosinophils Absolute: 0.1 10*3/uL (ref 0.0–0.7)
Eosinophils Relative: 2 %
HCT: 43.4 % (ref 36.0–46.0)
Hemoglobin: 14.1 g/dL (ref 12.0–15.0)
Lymphocytes Relative: 33 %
Lymphs Abs: 2.3 10*3/uL (ref 0.7–4.0)
MCH: 28.6 pg (ref 26.0–34.0)
MCHC: 32.5 g/dL (ref 30.0–36.0)
MCV: 88 fL (ref 78.0–100.0)
Monocytes Absolute: 0.3 10*3/uL (ref 0.1–1.0)
Monocytes Relative: 5 %
NEUTROS ABS: 4.2 10*3/uL (ref 1.7–7.7)
Neutrophils Relative %: 60 %
PLATELETS: 267 10*3/uL (ref 150–400)
RBC: 4.93 MIL/uL (ref 3.87–5.11)
RDW: 14 % (ref 11.5–15.5)
WBC: 7.1 10*3/uL (ref 4.0–10.5)

## 2017-05-14 LAB — URINALYSIS, ROUTINE W REFLEX MICROSCOPIC
Bilirubin Urine: NEGATIVE
GLUCOSE, UA: NEGATIVE mg/dL
Hgb urine dipstick: NEGATIVE
Ketones, ur: NEGATIVE mg/dL
Nitrite: NEGATIVE
PROTEIN: NEGATIVE mg/dL
Specific Gravity, Urine: 1.014 (ref 1.005–1.030)
pH: 7 (ref 5.0–8.0)

## 2017-05-14 LAB — LIPASE, BLOOD: LIPASE: 36 U/L (ref 11–51)

## 2017-05-14 LAB — I-STAT TROPONIN, ED: Troponin i, poc: 0.01 ng/mL (ref 0.00–0.08)

## 2017-05-14 LAB — CBG MONITORING, ED: GLUCOSE-CAPILLARY: 97 mg/dL (ref 65–99)

## 2017-05-14 MED ORDER — POLYETHYLENE GLYCOL 3350 17 G PO PACK: 17.0000 g | PACK | Freq: Every day | ORAL | 0 refills | Status: DC

## 2017-05-14 MED ORDER — SODIUM CHLORIDE 0.9 % IV BOLUS
1000.0000 mL | Freq: Once | INTRAVENOUS | Status: AC
  Administered 2017-05-14: 1000 mL via INTRAVENOUS

## 2017-05-14 MED ORDER — ONDANSETRON HCL 4 MG/2ML IJ SOLN
4.0000 mg | Freq: Once | INTRAMUSCULAR | Status: AC
  Administered 2017-05-14: 4 mg via INTRAVENOUS
  Filled 2017-05-14: qty 2

## 2017-05-14 MED ORDER — PROMETHAZINE HCL 25 MG PO TABS: 25.0000 mg | ORAL_TABLET | Freq: Four times a day (QID) | ORAL | 0 refills | Status: DC | PRN

## 2017-05-14 NOTE — Discharge Instructions (Signed)
You are constipated.  Take Miralax daily to help with bowel movement.  Take zofran as needed for nausea.  Follow up with your doctor for further care.  Return if you have any concerns.

## 2017-05-14 NOTE — ED Notes (Signed)
Pt stable and wheeled out for discharge, states understanding follow up. Paperwork given to daughter and daughter taking patient back to [facility]. Secretary called [facility] to cancel transport

## 2017-05-14 NOTE — ED Triage Notes (Signed)
Pt arrived via [facility] from [facility] c/o nausea and vomiting x1 week. Pt states she vomits 1-2x daily. Facility has been treating nausea with zorfran, last dose given at 0800 today. Pt is diabetic; facility held insulin "because pt hadn't eaten", per EMS. EMS v/s: CBG 91, 146/98, HR 62, resp 18, 93% room air PTA.

## 2017-05-14 NOTE — ED Provider Notes (Signed)
[facility] EMERGENCY DEPARTMENT Provider Note   CSN: [id number] Arrival date & time: 05/14/17  1125     History   Chief Complaint Chief Complaint  Patient presents with  . Nausea    HPI Holly Acosta is a 62 y.o. female.  HPI   62 year old female with significant history of diabetes, hypertension, CAD, thyromegaly, depression, brought here via EMS from nursing home for evaluation of nausea and vomiting.  Patient states he was recently diagnosed with acute stroke this month.  She is currently at a rehab facility for that.  For the past week she endorsed bouts of nausea, occasional vomiting once to twice a day and occasional loose stools.  Report decrease in appetite but denies any significant pain.  No report of fever, chills, chest pain, shortness of breath, productive cough, dysuria.  She is able to pass flatus.  She does have a ventral hernia but denies any significant discomfort with that.  No chest pain or shortness of breath.  Past Medical History:  Diagnosis Date  . Acute cerebrovascular accident (CVA) of cerebellum ([place]) 04/29/2017  . Anemia   . Arthritis of knee    "both knees" (04/24/2017)  . Coronary artery disease involving native coronary artery of native heart with unstable angina pectoris ([place]) 04/30/2017  . Diabetes mellitus, type 2 ([place])   . History of blood transfusion    "related to prolapsed uterus"  . HLD (hyperlipidemia)   . HTN (hypertension)   . Non-ST elevation (NSTEMI) myocardial infarction ([place])   . Prolapsed uterus   . Sleep apnea    "have mask; haven't worn it for a long time" (04/24/2017)  . Status post coronary artery stent placement     Patient Active Problem List   Diagnosis Date Noted  . Coronary artery disease involving native coronary artery of native heart with unstable angina pectoris ([place]) 04/30/2017  . Acute cerebrovascular accident (CVA) of cerebellum ([place]) 04/29/2017  . Elevated troponin   . Status post coronary  artery stent placement   . Non-ST elevation (NSTEMI) myocardial infarction ([place])   . Chest pain 04/24/2017  . Left hip pain 02/23/2014  . Obesity 01/29/2012  . Osteoarthritis, knee 12/11/2011  . COLONIC POLYPS, HYPERPLASTIC 09/13/2009  . THYROMEGALY 08/10/2009  . SLEEP APNEA 08/10/2009  . VENTRAL HERNIA 01/26/2009  . DEPRESSION 07/07/2008  . DM (diabetes mellitus), type 2, uncontrolled ([place]) 05/25/2007  . Hyperlipidemia associated with type 2 diabetes mellitus ([place]) 04/02/2007  . HYPERTENSION, BENIGN ESSENTIAL 04/02/2007  . OA (osteoarthritis) of knee 04/02/2007  . UTERINE PROLAPSE 01/20/2006    Past Surgical History:  Procedure Laterality Date  . CARDIAC CATHETERIZATION  2011  . CORONARY STENT INTERVENTION N/A 04/27/2017   Procedure: CORONARY STENT INTERVENTION;  Surgeon: Jettie Booze, MD;  Location: [facility];  Service: Cardiovascular;  Laterality: N/A;  . LEFT HEART CATH AND CORONARY ANGIOGRAPHY N/A 04/27/2017   Procedure: LEFT HEART CATH AND CORONARY ANGIOGRAPHY;  Surgeon: Jettie Booze, MD;  Location: [facility];  Service: Cardiovascular;  Laterality: N/A;  . TUBAL LIGATION       OB History   None    Obstetric Comments  3 pregnancies - All NSVD         Home Medications    Prior to Admission medications   Medication Sig Start Date End Date Taking? Authorizing Provider  ACCU-CHEK FASTCLIX LANCETS MISC 1 each by Does not apply route daily. Check sugar daily as needed 09/12/13  Olam Idler, MD  acetaminophen (TYLENOL) 500 MG tablet Take 1,000 mg by mouth every 12 (twelve) hours as needed for mild pain.     [provider]  amLODipine (NORVASC) 5 MG tablet Take 1 tablet (5 mg total) by mouth daily. 05/02/17   Strader, Fransisco Hertz, PA-C  aspirin EC 81 MG tablet Take 1 tablet (81 mg total) by mouth daily. 03/30/14   Olam Idler, MD  Blood Glucose Monitoring Suppl (ACCU-CHEK NANO SMARTVIEW) w/Device KIT Use to test blood sugar up to  3 times a day. ICD-10 code: E11.9 07/01/16   MayoPete Pelt, MD  carvedilol (COREG) 12.5 MG tablet TAKE 1 TABLET BY MOUTH TWICE A DAY WITH MEALS 03/18/17   Mayo, Pete Pelt, MD  fluconazole (DIFLUCAN) 150 MG tablet Take 150 mg by mouth daily.    [provider]  glucose blood (ACCU-CHEK AVIVA) test strip Use as instructed 05/08/16   Mayo, Pete Pelt, MD  glucose blood (ACCU-CHEK SMARTVIEW) test strip Use as instructed 06/30/16   Mayo, Pete Pelt, MD  LANTUS SOLOSTAR 100 UNIT/ML Solostar Pen INJECT 50 UNITS INTO THE SKIN IN THE MORNING Patient not taking: Reported on 05/07/2017 02/25/17   Mayo, Pete Pelt, MD  lisinopril (PRINIVIL,ZESTRIL) 20 MG tablet Take 1 tablet (20 mg total) by mouth daily. 05/02/17   Strader, Fransisco Hertz, PA-C  metFORMIN (GLUCOPHAGE) 1000 MG tablet TAKE 1 TABLET BY MOUTH TWICE DAILY WITH A MEAL Resume on 04/30/17. 04/28/17   [facility], Tami Lin, PA  NOVOLOG FLEXPEN 100 UNIT/ML FlexPen INJECT 20 UNITS INTO THE SKIN 3 TIMES DAILY WITH MEALS. Patient not taking: Reported on 05/07/2017 01/26/17   Mayo, Pete Pelt, MD  rosuvastatin (CRESTOR) 40 MG tablet Take 1 tablet (40 mg total) by mouth daily. 04/28/17   Ledora Bottcher, PA  ticagrelor (BRILINTA) 90 MG TABS tablet Take 1 tablet (90 mg total) by mouth 2 (two) times daily. 05/02/17   Strader, Fransisco Hertz, PA-C  TRULICITY 2.86 NO/1.7RN SOPN INJECT 0.75 MG INTO THE SKIN ONCE A WEEK. 01/15/17   Mayo, Pete Pelt, MD    Family History Family History  Problem Relation Age of Onset  . Diabetes Mother   . Hypertension Mother   . Hypertension Father     Social History Social History   Tobacco Use  . Smoking status: Former Smoker    Packs/day: 1.00    Years: 33.00    Pack years: 33.00    Types: Cigarettes    Start date: 01/20/1973    Last attempt to quit: 11/04/2006    Years since quitting: 10.5  . Smokeless tobacco: Never Used  Substance Use Topics  . Alcohol use: Never    Alcohol/week: 0.0 oz    Frequency: Never  . Drug use:  Never     Allergies   Lipitor [atorvastatin] and Diclofenac   Review of Systems Review of Systems  All other systems reviewed and are negative.    Physical Exam Updated Vital Signs BP 133/64 (BP Location: Right Arm)   Pulse (!) 55   Temp 97.7 F (36.5 C) (Oral)   Resp 16   SpO2 97%   Physical Exam  Constitutional: She appears well-developed and well-nourished. No distress.  HENT:  Head: Atraumatic.  Eyes: Conjunctivae are normal.  Neck: Neck supple.  Cardiovascular: Normal rate and regular rhythm.  Pulmonary/Chest: Effort normal and breath sounds normal.  Abdominal: Soft. Bowel sounds are normal. She exhibits no distension. There is no tenderness. A hernia (Patient does  have a ventral hernia, chronic and reducible) is present.  Neurological: She is alert.  Skin: No rash noted.  Psychiatric: She has a normal mood and affect.  Nursing note and vitals reviewed.    ED Treatments / Results  Labs (all labs ordered are listed, but only abnormal results are displayed) Labs Reviewed  COMPREHENSIVE METABOLIC PANEL - Abnormal; Notable for the following components:      Result Value   Creatinine, Ser 1.03 (*)    Calcium 8.6 (*)    Total Protein 6.3 (*)    Albumin 2.9 (*)    GFR calc non Af Amer 57 (*)    All other components within normal limits  URINALYSIS, ROUTINE W REFLEX MICROSCOPIC - Abnormal; Notable for the following components:   APPearance HAZY (*)    Leukocytes, UA SMALL (*)    Bacteria, UA FEW (*)    All other components within normal limits  CBC WITH DIFFERENTIAL/PLATELET  LIPASE, BLOOD  CBG MONITORING, ED  I-STAT TROPONIN, ED    EKG None  Radiology No results found.  Procedures Procedures (including critical care time)  Medications Ordered in ED Medications  ondansetron (ZOFRAN) injection 4 mg (4 mg Intravenous Given 05/14/17 1241)  sodium chloride 0.9 % bolus 1,000 mL (0 mLs Intravenous Stopped 05/14/17 1505)     Initial Impression /  Assessment and Plan / ED Course  I have reviewed the triage vital signs and the nursing notes.  Pertinent labs & imaging results that were available during my care of the patient were reviewed by me and considered in my medical decision making (see chart for details).     BP 133/64 (BP Location: Right Arm)   Pulse (!) 55   Temp 97.7 F (36.5 C) (Oral)   Resp 16   SpO2 97%    Final Clinical Impressions(s) / ED Diagnoses   Final diagnoses:  Nausea    ED Discharge Orders        Ordered    polyethylene glycol (MIRALAX / GLYCOLAX) packet  Daily     05/14/17 1500    promethazine (PHENERGAN) 25 MG tablet  Every 6 hours PRN     05/14/17 1501     2:58 PM Patient endorsed recurrent bouts of nausea, occasional vomiting.  Does have bowel movement.  No abdominal pain.  Does have a chronic abdominal hernia but no increasing pain there.  It is reducible.  She is resting comfortably.Labs are reassuring.  Acute abdominal series x-ray without concerning feature.  Evidence of moderate stool burden.  Suspect constipation causing her symptoms.  Low suspicion for bowel obstruction, or incarcerated hernia as patient has no pain upon palpation of her abdomen while I try to reduce her hernia.  Patient discharged home with MiraLAX.  Return precautions discussed.  Care discussed with Dr. Thomasene Lot.    Domenic Moras, PA-C 05/14/17 1558    Mackuen, Fredia Sorrow, MD 05/20/17 [phone]

## 2017-05-19 ENCOUNTER — Telehealth: Payer: Self-pay | Admitting: Internal Medicine

## 2017-05-19 NOTE — Telephone Encounter (Signed)
Will forward to MD to advise. Jazmin Hartsell,CMA  

## 2017-05-19 NOTE — Telephone Encounter (Signed)
Pt mother called and would like to know if pt could have a referral to a  Gastroenterologist. She is having issues with throwing up and she is not sure what is causing it. Amy her daughter would like to be called back at [phone]

## 2017-05-20 NOTE — Telephone Encounter (Signed)
Returned phone call. No answer and had to leave a voicemail asking patient's daughter to call us back. I am fine to refer patient to the gastroenterologist, but they are usually booked out for a couple of months for new patient visits. Think patient should be evaluated here if she is having vomiting. Would recommend that patient schedule an appointment to be seen.  Hyman Bible, MD PGY-3

## 2017-05-27 ENCOUNTER — Ambulatory Visit: Payer: Medicaid Other | Admitting: Internal Medicine

## 2017-06-01 ENCOUNTER — Encounter: Payer: Self-pay | Admitting: Internal Medicine

## 2017-06-01 ENCOUNTER — Other Ambulatory Visit: Payer: Self-pay

## 2017-06-01 ENCOUNTER — Ambulatory Visit (INDEPENDENT_AMBULATORY_CARE_PROVIDER_SITE_OTHER): Payer: Medicaid Other | Admitting: Internal Medicine

## 2017-06-01 VITALS — BP 104/64 | HR 82 | Temp 98.6°F | Wt 165.0 lb

## 2017-06-01 DIAGNOSIS — G4733 Obstructive sleep apnea (adult) (pediatric): Secondary | ICD-10-CM | POA: Diagnosis present

## 2017-06-01 DIAGNOSIS — K5900 Constipation, unspecified: Secondary | ICD-10-CM | POA: Diagnosis not present

## 2017-06-01 DIAGNOSIS — Z1211 Encounter for screening for malignant neoplasm of colon: Secondary | ICD-10-CM | POA: Diagnosis not present

## 2017-06-01 MED ORDER — SENNOSIDES-DOCUSATE SODIUM 8.6-50 MG PO TABS: 1.0000 | ORAL_TABLET | Freq: Every day | ORAL | 0 refills | Status: DC

## 2017-06-01 NOTE — Patient Instructions (Signed)
It was so nice to see you!  I have sent in a new stool softener called Senokot-S.   I will send in an order for the rolling walker with seat to [facility].  You will need a new sleep study in order to get a new CPAP machine. [facility] to get [facility].  -Dr. Brett Albino

## 2017-06-01 NOTE — Progress Notes (Signed)
   [facility] Phone: [phone]  Subjective:  Holly Acosta is a 62 year old female presenting to clinic for ED follow-up and to discuss her OSA.  Nausea: Seen in the ED for this 05/14/17. Had an abdominal x-ray done that showed moderate stool burden. She was prescribed Miralax, which she only took once because it "made her feel weird". Her nausea and vomiting have completely resolved on their own. She thinks she had the nausea because she didn't like the nursing home food, so she wasn't eating and was taking her medications on an empty stomach. She is having a bowel movement every 2-3 days. No straining. No abdominal pain.  OSA: Diagnosed by sleep study in 2011. States she has had the same CPAP machine. She needs another one because her current machine is broken. Endorses nighttime snoring.  ROS: See HPI for pertinent positives and negatives  Past Medical History- CAD s/p DES x 1 to LCx, hx CVA, HTN, T2DM, HLD, OSA  Family history reviewed for today's visit. No changes.  Social history- patient is a former smoker, quit in 2008.  Objective: BP 104/64   Pulse 82   Temp 98.6 F (37 C) (Oral)   Wt 165 lb (74.8 kg)   SpO2 99%   BMI 27.46 kg/m  Gen: NAD, alert, cooperative with exam HEENT: NCAT, EOMI, MMM Neck: FROM, supple CV: RRR, no murmur Resp: CTABL, no wheezes, normal work of breathing GI: SNTND, BS present, no guarding or organomegaly, ~8cm x 6cm reducible ventral wall hernia present; hernia is not tender to palpation. Msk: No edema Neuro: Alert and oriented, no gross deficits Skin: No rashes, no lesions Psych: Appropriate behavior  Assessment/Plan: Constipation: Recent AXR 05/14/17 with moderate stool burden. Not on any stool softeners at home. She does have a ventral wall hernia, but no signs of obstruction or incarceration.  - Start Senokot-S daily  OSA: Last sleep study was in 2011. Current CPAP machine is broken. - New sleep study ordered -  Patient will need new CPAP  Screening for Colon Cancer: Last colonoscopy was in 2011, recommended repeat colonoscopy in 5 days - Referral placed to [facility]   Hyman Bible, MD PGY-3

## 2017-06-02 DIAGNOSIS — K5900 Constipation, unspecified: Secondary | ICD-10-CM

## 2017-06-02 DIAGNOSIS — Z1211 Encounter for screening for malignant neoplasm of colon: Secondary | ICD-10-CM | POA: Insufficient documentation

## 2017-06-02 HISTORY — DX: Constipation, unspecified: K59.00

## 2017-06-02 NOTE — Assessment & Plan Note (Signed)
Last sleep study was in 2011. Current CPAP machine is broken. - New sleep study ordered - Patient will need new CPAP

## 2017-06-02 NOTE — Assessment & Plan Note (Signed)
Last colonoscopy was in 2011, recommended repeat colonoscopy in 5 days - Referral placed to [facility]

## 2017-06-02 NOTE — Assessment & Plan Note (Signed)
Recent AXR 05/14/17 with moderate stool burden. Not on any stool softeners at home. She does have a ventral wall hernia, but no signs of obstruction or incarceration.  - Start Senokot-S daily

## 2017-06-04 ENCOUNTER — Other Ambulatory Visit: Payer: Self-pay | Admitting: Internal Medicine

## 2017-06-05 ENCOUNTER — Encounter: Payer: Self-pay | Admitting: Adult Health

## 2017-06-05 ENCOUNTER — Other Ambulatory Visit: Payer: Self-pay | Admitting: Internal Medicine

## 2017-06-05 ENCOUNTER — Ambulatory Visit (INDEPENDENT_AMBULATORY_CARE_PROVIDER_SITE_OTHER): Payer: Medicaid Other | Admitting: Adult Health

## 2017-06-05 VITALS — BP 122/71 | HR 64 | Ht 65.0 in | Wt 167.0 lb

## 2017-06-05 DIAGNOSIS — I10 Essential (primary) hypertension: Secondary | ICD-10-CM

## 2017-06-05 DIAGNOSIS — I63532 Cerebral infarction due to unspecified occlusion or stenosis of left posterior cerebral artery: Secondary | ICD-10-CM

## 2017-06-05 DIAGNOSIS — G4733 Obstructive sleep apnea (adult) (pediatric): Secondary | ICD-10-CM | POA: Diagnosis not present

## 2017-06-05 DIAGNOSIS — I639 Cerebral infarction, unspecified: Secondary | ICD-10-CM

## 2017-06-05 DIAGNOSIS — E785 Hyperlipidemia, unspecified: Secondary | ICD-10-CM

## 2017-06-05 NOTE — Progress Notes (Signed)
[facility] [address] [place]. [place] [address] [phone]       OFFICE FOLLOW UP NOTE  Holly. Holly Acosta Date of Birth:  1955-02-23 Medical Record Number:  [id number]   Reason for Referral:  hospital stroke follow up  CHIEF COMPLAINT:  Chief Complaint  Patient presents with  . Follow-up    Patient reports that she is doing well.     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 History obtained from patient and chart review. Reviewed all radiology images and labs personally. Holly Acosta is a 62 year old female with history of diabetes, hypertension, hyperlipidemia, OSA on CPAP, and obesity admitted on 04/27/17 for non-STEMI status post stent, currently on aspirin and Brilinta for cardio prevention.  Postprocedure, patient found to be lethargic, drowsy and sleepy.  Had CT head showed left PICA large infarct.  MRI  confirmed left PICA infarct, no hydrocephalus.  CT head and neck unremarkable except left PICA occlusion, consistent with current infarct.  EF 55-60% with multi region hypokinesis.  A1c is 13.3 and LDL 107. Patients mental status improved without focal neurological deficit, no ataxia or dysmetria during hospitalization. Patients stroke likely due to STEMI versus cardiac procedure.  However, patient had multiple stroke risk factors including uncontrolled diabetes, CAD, hyperlipidemia, obesity and OSA.  Recommend continue aspirin and Brilinta for stroke and cardiac prevention along with stroke risk factor modification.  PT/OT recommended SNF. Patient discharged in stable condition .  Patient is being seen today for follow-up visit and is accompanied by her granddaughter.  Overall patient is doing well from a stroke standpoint and does not have residual deficits.  Continues to take Brilinta and aspirin without  side effects of bleeding or bruising.  Continues to take Crestor without side effects of myalgias.  Blood pressure today satisfactory 122/71.  Patient did return home from SNF yesterday and believes they will be therapies that will be sent out next week.  Patient is able to do all ADLs and IADLs and does live currently with her granddaughter.  She is currently sitting in wheelchair in which she uses for long distance only and uses a rolling walker in her house.  She does have a history of OSA and was on a CPAP machine prior but as this broke, per insurance she needs a new sleep study and has a current referral from her PCP to this office.  She does complain of occasional right arm pain especially while she is laying on it and questioning whether she can take Tylenol as needed for this pain.  She is following up with cardiology on Monday.  Denies new or worsening stroke/TIA symptoms.     ROS:   14 system review of systems performed and negative with exception of constipation, dizziness at times, and daytime sleepiness at times  PMH:  Past Medical History:  Diagnosis Date  . Acute cerebrovascular accident (CVA) of cerebellum ([place]) 04/29/2017  . Anemia   . Arthritis of knee    "both knees" (04/24/2017)  . Coronary artery disease involving native coronary artery of native heart with unstable angina pectoris ([place]) 04/30/2017  . Diabetes mellitus, type 2 ([place])   . History of blood transfusion    "related to prolapsed uterus"  . HLD (hyperlipidemia)   . HTN (hypertension)   . Non-ST elevation (NSTEMI) myocardial infarction ([place])   .  Prolapsed uterus   . Sleep apnea    "have mask; haven't worn it for a long time" (04/24/2017)  . Status post coronary artery stent placement     PSH:  Past Surgical History:  Procedure Laterality Date  . CARDIAC CATHETERIZATION  2011  . CORONARY STENT INTERVENTION N/A 04/27/2017   Procedure: CORONARY STENT INTERVENTION;  Surgeon: Jettie Booze, MD;  Location: [facility];  Service: Cardiovascular;  Laterality: N/A;  . LEFT HEART CATH AND CORONARY ANGIOGRAPHY N/A 04/27/2017   Procedure: LEFT HEART CATH AND CORONARY ANGIOGRAPHY;  Surgeon: Jettie Booze, MD;  Location: [facility];  Service: Cardiovascular;  Laterality: N/A;  . TUBAL LIGATION      Social History:  Social History   Socioeconomic History  . Marital status: Divorced    Spouse name: Not on file  . Number of children: Not on file  . Years of education: Not on file  . Highest education level: Not on file  Occupational History  . Not on file  Social Needs  . Financial resource strain: Not on file  . Food insecurity:    Worry: Not on file    Inability: Not on file  . Transportation needs:    Medical: Not on file    Non-medical: Not on file  Tobacco Use  . Smoking status: Former Smoker    Packs/day: 1.00    Years: 33.00    Pack years: 33.00    Types: Cigarettes    Start date: 01/20/1973    Last attempt to quit: 11/04/2006    Years since quitting: 10.5  . Smokeless tobacco: Never Used  Substance and Sexual Activity  . Alcohol use: Never    Alcohol/week: 0.0 oz    Frequency: Never  . Drug use: Never  . Sexual activity: Not Currently  Lifestyle  . Physical activity:    Days per week: Not on file    Minutes per session: Not on file  . Stress: Not on file  Relationships  . Social connections:    Talks on phone: Not on file    Gets together: Not on file    Attends religious service: Not on file    Active member of club or organization: Not on file    Attends meetings of clubs or organizations: Not on file    Relationship status: Not on file  . Intimate partner violence:    Fear of current or ex partner: Not on file    Emotionally abused: Not on file    Physically abused: Not on file    Forced sexual activity: Not on file  Other Topics Concern  . Not on file  Social History Narrative   Lives with daughter [name] age 41) and 4 grandchildren.    Disability - Arthritis. Does get disability check.   Does not have a living will yet    Family History:  Family History  Problem Relation Age of Onset  . Diabetes Mother   . Hypertension Mother   . Hypertension Father     Medications:   Current Outpatient Medications on File Prior to Visit  Medication Sig Dispense Refill  . ACCU-CHEK FASTCLIX LANCETS MISC 1 each by Does not apply route daily. Check sugar daily as needed 102 each 3  . acetaminophen (TYLENOL) 500 MG tablet Take 1,000 mg by mouth every 12 (twelve) hours as needed for mild pain.     Marland Kitchen amLODipine (NORVASC) 5 MG tablet Take 1 tablet (5 mg  total) by mouth daily. 90 tablet 3  . aspirin EC 81 MG tablet Take 1 tablet (81 mg total) by mouth daily. 90 tablet 3  . Blood Glucose Monitoring Suppl (ACCU-CHEK NANO SMARTVIEW) w/Device KIT Use to test blood sugar up to 3 times a day. ICD-10 code: E11.9 1 kit 0  . carvedilol (COREG) 12.5 MG tablet TAKE 1 TABLET BY MOUTH TWICE A DAY WITH MEALS 180 tablet 0  . glucose blood (ACCU-CHEK AVIVA) test strip Use as instructed 100 each 12  . glucose blood (ACCU-CHEK SMARTVIEW) test strip Use as instructed 100 each 12  . insulin aspart (NOVOLOG) 100 UNIT/ML injection Inject 2-8 Units into the skin See admin instructions. Sliding scale: 201-250: 2 units; 251-300: 4 units; 301-350: 6 units; 351-400: 8 units.    Marland Kitchen LANTUS SOLOSTAR 100 UNIT/ML Solostar Pen INJECT 50 UNITS INTO THE SKIN IN THE MORNING 45 pen 0  . lisinopril (PRINIVIL,ZESTRIL) 20 MG tablet Take 1 tablet (20 mg total) by mouth daily. 90 tablet 3  . metFORMIN (GLUCOPHAGE) 1000 MG tablet TAKE 1 TABLET BY MOUTH TWICE DAILY WITH A MEAL Resume on 04/30/17. 60 tablet 2  . NOVOLOG FLEXPEN 100 UNIT/ML FlexPen INJECT 20 UNITS INTO THE SKIN 3 TIMES DAILY WITH MEALS. 15 pen 2  . ondansetron (ZOFRAN-ODT) 4 MG disintegrating tablet Take 4 mg by mouth daily as needed for nausea or vomiting.    . polyethylene glycol (MIRALAX / GLYCOLAX) packet Take 17 g by  mouth daily. 14 each 0  . promethazine (PHENERGAN) 25 MG tablet Take 1 tablet (25 mg total) by mouth every 6 (six) hours as needed for nausea or vomiting. 30 tablet 0  . rosuvastatin (CRESTOR) 40 MG tablet Take 1 tablet (40 mg total) by mouth daily. 90 tablet 3  . senna-docusate (SENOKOT-S) 8.6-50 MG tablet Take 1 tablet by mouth daily. 90 tablet 0  . ticagrelor (BRILINTA) 90 MG TABS tablet Take 1 tablet (90 mg total) by mouth 2 (two) times daily. 180 tablet 3  . TRULICITY 0.35 KK/9.3GH SOPN INJECT 0.75 MG INTO THE SKIN ONCE A WEEK. 2 pen 0   No current facility-administered medications on file prior to visit.     Allergies:   Allergies  Allergen Reactions  . Lipitor [Atorvastatin] Other (See Comments)    Cramping in feet that resolved after stopping med  . Diclofenac Nausea Only and Other (See Comments)    Caused a lot of stomach pain      Physical Exam  Vitals:   06/05/17 1120  BP: 122/71  Pulse: 64  Weight: 167 lb (75.8 kg)  Height: '5\' 5"'$  (1.651 m)   Body mass index is 27.79 kg/m. No exam data present  General: well developed, pleasant obese Caucasian female, well nourished, seated, in no evident distress Head: head normocephalic and atraumatic.   Neck: supple with no carotid or supraclavicular bruits Cardiovascular: regular rate and rhythm, no murmurs Musculoskeletal: no deformity Skin:  no rash/petichiae Vascular:  Normal pulses all extremities  Neurologic Exam Mental Status: Awake and fully alert. Oriented to place and time. Recent and remote memory intact. Attention span, concentration and fund of knowledge appropriate. Mood and affect appropriate.  Cranial Nerves: Fundoscopic exam reveals sharp disc margins. Pupils equal, briskly reactive to light. Extraocular movements full without nystagmus. Visual fields full to confrontation. Hearing intact. Facial sensation intact. Face, tongue, palate moves normally and symmetrically.  Motor: Normal bulk and tone. Normal  strength in all tested extremity muscles. Sensory.: intact to touch ,  pinprick , position and vibratory sensation.  Coordination: Rapid alternating movements normal in all extremities. Finger-to-nose and heel-to-shin performed accurately bilaterally. Gait and Station: Arises from chair without difficulty. Stance is normal. Gait demonstrates normal stride length and balance . Able to heel, toe and tandem walk without difficulty.  Reflexes: 1+ and symmetric. Toes downgoing.    NIHSS  0 Modified [place]  1    Diagnostic Data (Labs, Imaging, Testing)  CT head 04/28/2017 IMPRESSION: Acute to early subacute left posterior inferior cerebellar artery territory infarct without hemorrhage. Mild mass effect on the fourth ventricle, but no hydrocephalus.  MRI brain 04/29/2017 IMPRESSION: 1. 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 Associated mild petechial hemorrhage without frank hemorrhagic transformation. Localized edema with mild mass effect on the fourth ventricular outflow tract which remains patent at this time. No hydrocephalus. 2. Additional 8 mm acute ischemic nonhemorrhagic right cerebellar infarct. 3. Moderate nonspecific cerebral white matter changes, most likely related to chronic small vessel ischemic disease.  CT angio neck CT angio head 04/29/2017 IMPRESSION: 1. Mid left PICA occlusion correlating with known acute infarct. No underlying embolic source or flow limiting stenosis identified. 2. Overall mild intracranial and cervical atherosclerosis  Echocardiogram 04/29/2017 Impressions:  - There is hypokinesis of the basal inferior, inferolateral and mid   inferolateral walls, overall LVEF is preserved at 55-60%.    ASSESSMENT: ZAMIAH TOLLETT is a 62 y.o. year old female here with left cerebellar/left PICA infarcts on 04/28/2017 secondary to post cardiac procedure. Vascular risk factors include HTN, CAD, HLD, DM and OSA.      PLAN: -Continue aspirin 81 mg daily and Brilinta  and Crestor for secondary stroke prevention -F/u with PCP regarding your HLD, HTN, CAD, and DM management -f/u cardiologist on 06/08/2017 -Schedule appointment for sleep study -continue to monitor BP at home -Home PT OT -Maintain strict control of hypertension with blood pressure goal below 130/90, diabetes with hemoglobin A1c goal below 6.5% and cholesterol with LDL cholesterol (bad cholesterol) goal below 70 mg/dL. I also advised the patient to eat a healthy diet with plenty of whole grains, cereals, fruits and vegetables, exercise regularly and maintain ideal body weight.  Follow up in 4 months or call earlier if needed   Greater than 50% of time during this 25 minute visit was spent on counseling,explanation of diagnosis of left cerebellar/left PICA infarcts, reviewing risk factor management of HLD, HTN, CAD, DM, and OSA, planning of further management, discussion with patient and family and coordination of care  Venancio Poisson, [facility]  [facility] [address] [place], [place] [address]  Phone [phone] Fax [phone]

## 2017-06-05 NOTE — Patient Instructions (Addendum)
Continue aspirin 81 mg daily and brilinta  and crestor  for secondary stroke prevention  Continue to follow up with PCP regarding cholesterol and blood pressure management   Continue to follow up with cardiologist on 06/08/17  Schedule appointment for sleep study  Continue therapies at home - if you have any issues, please call and let us know  Maintain strict control of hypertension with blood pressure goal below 130/90, diabetes with hemoglobin A1c goal below 6.5% and cholesterol with LDL cholesterol (bad cholesterol) goal below 70 mg/dL. I also advised the patient to eat a healthy diet with plenty of whole grains, cereals, fruits and vegetables, exercise regularly and maintain ideal body weight.  Followup in the future with me in 4 months or call earlier if needed         Thank you for coming to see [place] at [facility]. I hope we have been able to provide you high quality care today.  You may receive a patient satisfaction survey over the next few weeks. We would appreciate your feedback and comments so that we may continue to improve ourselves and the health of our patients.

## 2017-06-08 ENCOUNTER — Encounter: Payer: Self-pay | Admitting: Adult Health

## 2017-06-08 ENCOUNTER — Ambulatory Visit (INDEPENDENT_AMBULATORY_CARE_PROVIDER_SITE_OTHER): Payer: Medicaid Other | Admitting: Adult Health

## 2017-06-08 VITALS — BP 130/80 | HR 80 | Ht 65.0 in | Wt 167.6 lb

## 2017-06-08 DIAGNOSIS — I10 Essential (primary) hypertension: Secondary | ICD-10-CM

## 2017-06-08 DIAGNOSIS — I2510 Atherosclerotic heart disease of native coronary artery without angina pectoris: Secondary | ICD-10-CM

## 2017-06-08 DIAGNOSIS — I639 Cerebral infarction, unspecified: Secondary | ICD-10-CM

## 2017-06-08 DIAGNOSIS — E7800 Pure hypercholesterolemia, unspecified: Secondary | ICD-10-CM

## 2017-06-08 NOTE — Patient Instructions (Signed)
Medication Instructions:  NO CHANGES- Your physician recommends that you continue on your current medications as directed. Please refer to the Current Medication list given to you today.  If you need a refill on your cardiac medications before your next appointment, please call your pharmacy.  Follow-Up: Your physician wants you to follow-up in: 6 months with Dr [place] You should receive a reminder letter in the mail two months in advance. If you do not receive a letter, please call our office sept 2019 to schedule the November 2019 follow-up appointment.   Thank you for choosing [facility] at [facility]!!

## 2017-06-08 NOTE — Progress Notes (Signed)
Cardiology Office Note   Date:  06/08/2017   ID:  Holly, Acosta 03-Jul-1955, MRN [id number]  PCP:  Holly Hua, MD   Cardiologist: Dr. Martinique  Chief Complaint  Patient presents with  . Follow-up    denies chest pains, SOB, swelling hands/feet     History of Present Illness: Holly Acosta is a 62 y.o. female who presents for ongoing assessment and management of coronary artery disease, most recent catheterization April 2019 with severe stenosis of the left circumflex requiring drug-eluting stent, currently on dual antiplatelet therapy with Brilinta and aspirin.    He has a history of a moderate sized acute ischemic left cerebral infarct without frank hemorrhagic transformation and acute ischemic nonhemorrhagic right cerebellar infarct during recent hospitalization in April.  The patient was transferred to skilled nursing facility for rehab.  Other history includes uncontrolled diabetes followed by PCP.  The patient was last seen in the office on 05/07/2017.  She is here for close 1 month follow-up to ascertain her response to medical management.  She remains on ACE inhibitor, statin therapy, and beta-blocker along with dual antiplatelet therapy.  She was seen in the ER on 05/14/2017, It was suspected that constipation was causing symptoms. She was found to have a reducible ventral hernia without bowel incarceration. She was told to take Miralax daily.   She is currently without complaints. She is home from PT rehab facility and is currently walking with a walker at home. She is interested in cardiac rehab. She is moving along well and is medically compliant.   Past Medical History:  Diagnosis Date  . Acute cerebrovascular accident (CVA) of cerebellum ([place]) 04/29/2017  . Anemia   . Arthritis of knee    "both knees" (04/24/2017)  . Coronary artery disease involving native coronary artery of native heart with unstable angina pectoris ([place]) 04/30/2017  . Diabetes mellitus, type 2 ([place])     . History of blood transfusion    "related to prolapsed uterus"  . HLD (hyperlipidemia)   . HTN (hypertension)   . Non-ST elevation (NSTEMI) myocardial infarction ([place])   . Prolapsed uterus   . Sleep apnea    "have mask; haven't worn it for a long time" (04/24/2017)  . Status post coronary artery stent placement     Past Surgical History:  Procedure Laterality Date  . CARDIAC CATHETERIZATION  2011  . CORONARY STENT INTERVENTION N/A 04/27/2017   Procedure: CORONARY STENT INTERVENTION;  Surgeon: Holly Booze, MD;  Location: [facility];  Service: Cardiovascular;  Laterality: N/A;  . LEFT HEART CATH AND CORONARY ANGIOGRAPHY N/A 04/27/2017   Procedure: LEFT HEART CATH AND CORONARY ANGIOGRAPHY;  Surgeon: Holly Booze, MD;  Location: [facility];  Service: Cardiovascular;  Laterality: N/A;  . TUBAL LIGATION       Current Outpatient Medications  Medication Sig Dispense Refill  . ACCU-CHEK FASTCLIX LANCETS MISC 1 each by Does not apply route daily. Check sugar daily as needed 102 each 3  . acetaminophen (TYLENOL) 500 MG tablet Take 1,000 mg by mouth every 12 (twelve) hours as needed for mild pain.     Marland Kitchen amLODipine (NORVASC) 5 MG tablet Take 1 tablet (5 mg total) by mouth daily. 90 tablet 3  . aspirin EC 81 MG tablet Take 1 tablet (81 mg total) by mouth daily. 90 tablet 3  . Blood Glucose Monitoring Suppl (ACCU-CHEK NANO SMARTVIEW) w/Device KIT Use to test blood sugar up to 3 times a  day. ICD-10 code: E11.9 1 kit 0  . carvedilol (COREG) 12.5 MG tablet TAKE 1 TABLET BY MOUTH TWICE A DAY WITH MEALS 180 tablet 0  . glucose blood (ACCU-CHEK AVIVA) test strip Use as instructed 100 each 12  . glucose blood (ACCU-CHEK SMARTVIEW) test strip Use as instructed 100 each 12  . insulin aspart (NOVOLOG) 100 UNIT/ML injection Inject 2-8 Units into the skin See admin instructions. Sliding scale: 201-250: 2 units; 251-300: 4 units; 301-350: 6 units; 351-400: 8 units.    Marland Kitchen LANTUS  SOLOSTAR 100 UNIT/ML Solostar Pen INJECT 50 UNITS INTO THE SKIN IN THE MORNING 45 pen 0  . lisinopril (PRINIVIL,ZESTRIL) 20 MG tablet Take 1 tablet (20 mg total) by mouth daily. 90 tablet 3  . metFORMIN (GLUCOPHAGE) 1000 MG tablet TAKE 1 TABLET BY MOUTH TWICE DAILY WITH A MEAL Resume on 04/30/17. 60 tablet 2  . ondansetron (ZOFRAN-ODT) 4 MG disintegrating tablet Take 4 mg by mouth daily as needed for nausea or vomiting.    . polyethylene glycol (MIRALAX / GLYCOLAX) packet Take 17 g by mouth daily. 14 each 0  . promethazine (PHENERGAN) 25 MG tablet Take 1 tablet (25 mg total) by mouth every 6 (six) hours as needed for nausea or vomiting. 30 tablet 0  . rosuvastatin (CRESTOR) 40 MG tablet Take 1 tablet (40 mg total) by mouth daily. 90 tablet 3  . senna-docusate (SENOKOT-S) 8.6-50 MG tablet Take 1 tablet by mouth daily. 90 tablet 0  . ticagrelor (BRILINTA) 90 MG TABS tablet Take 1 tablet (90 mg total) by mouth 2 (two) times daily. 180 tablet 3  . TRULICITY 7.51 WC/5.8NI SOPN INJECT 0.75 MG INTO THE SKIN ONCE A WEEK. 4 pen 0  . NOVOLOG FLEXPEN 100 UNIT/ML FlexPen INJECT 20 UNITS INTO THE SKIN 3 TIMES DAILY WITH MEALS. (Patient not taking: Reported on 06/08/2017) 15 pen 2   No current facility-administered medications for this visit.     Allergies:   Lipitor [atorvastatin] and Diclofenac    Social History:  The patient  reports that she quit smoking about 10 years ago. Her smoking use included cigarettes. She started smoking about 44 years ago. She has a 33.00 pack-year smoking history. She has never used smokeless tobacco. She reports that she does not drink alcohol or use drugs.   Family History:  The patient's family history includes Diabetes in her mother; Hypertension in her father and mother.    ROS: All other systems are reviewed and negative. Unless otherwise mentioned in H&P    PHYSICAL EXAM: VS:  BP 130/80 (BP Location: Left Arm)   Pulse 80   Ht '5\' 5"'$  (1.651 m)   Wt 167 lb 9.6 oz  (76 kg)   BMI 27.89 kg/m  , BMI Body mass index is 27.89 kg/m. GEN: Well nourished, well developed, in no acute distress  HEENT: normal  Neck: no JVD, carotid bruits, or masses Cardiac: RRR; no murmurs, rubs, or gallops,no edema  Respiratory:  clear to auscultation bilaterally, normal work of breathing GI: soft, nontender, nondistended, + BS MS: no deformity or atrophy  Skin: warm and dry, no rash Neuro:  Strength and sensation are intact Psych: euthymic mood, full affect   EKG:  Not completed during office visit   Recent Labs: 04/24/2017: B Natriuretic Peptide 31.0 04/28/2017: TSH 0.422 05/14/2017: ALT 26; BUN 10; Creatinine, Ser 1.03; Hemoglobin 14.1; Platelets 267; Potassium 3.5; Sodium 135    Lipid Panel    Component Value Date/Time   CHOL 173  04/29/2017 0557   TRIG 159 (H) 04/29/2017 0557   HDL 34 (L) 04/29/2017 0557   CHOLHDL 5.1 04/29/2017 0557   VLDL 32 04/29/2017 0557   LDLCALC 107 (H) 04/29/2017 0557   LDLDIRECT 135 (H) 12/31/2012 0949      Wt Readings from Last 3 Encounters:  06/08/17 167 lb 9.6 oz (76 kg)  06/05/17 167 lb (75.8 kg)  06/01/17 165 lb (74.8 kg)      Other studies Reviewed:  Cardiac Cath 04/27/2017     Prox RCA lesion is 25% stenosed.  Mid RCA to Dist RCA lesion is 25% stenosed.  Prox LAD lesion is 25% stenosed.  The left ventricular systolic function is normal.  LV end diastolic pressure is mildly elevated. LVEDP 18 mm Hg.  The left ventricular ejection fraction is 55-65% by visual estimate.  There is no aortic valve stenosis.  Prox Cx lesion is 99% stenosed. This was the culprit lesion.  A drug-eluting stent was successfully placed using a STENT SIERRA 3.50 X 18 MM.  Post intervention, there is a 0% residual stenosis.   Continue dual antiplatelet therapy for at least one year with aggressive secondary prevention.       Echocardiogram 04/29/2017  Left ventricle: The cavity size was normal. There was moderate   concentric  hypertrophy. Systolic function was normal. The   estimated ejection fraction was in the range of 55% to 60%.   Doppler parameters are consistent with abnormal left ventricular   relaxation (grade 1 diastolic dysfunction). There was no evidence   of elevated ventricular filling pressure by Doppler parameters. - Mitral valve: There was no regurgitation. Valve area by pressure   half-time: 2.08 cm^2. - Left atrium: The atrium was normal in size. - Right ventricle: Systolic function was normal. - Right atrium: The atrium was normal in size. - Tricuspid valve: There was no regurgitation. - Pericardium, extracardiac: The pericardium was normal in   appearance.  Impressions:  - There is hypokinesis of the basal inferior, inferolateral and mid   inferolateral walls, overall LVEF is preserved at 55-60%.   ASSESSMENT AND PLAN:  1.  CAD: S/P cath requiring severe stenosis of the left circumflex requiring drug-eluting stent, She remains on Brilinta and ASA. She denies any bleeding or hemoptysis. She will be referred to cardiac rehab for ongoing education and PT support.   2. CVA: Bilateral cerebral non-hemmorhagic infarcts. She has completed inpatient rehab and continues to improve ambulation with walker at home. She is followed by neurology.   3. Hypercholesterolemia: Continues on Crestor. Will need follow up labs in 3 months. She is having them completed by PCP.   4. Diabetes: Significant weight loss since having cardiac and cerebral events. Insulin dose has been reduced. Followed by PCP.   Current medicines are reviewed at length with the patient today.    Labs/ tests ordered today include: None Phill Myron. West Pugh, ANP, AACC   06/08/2017 3:54 PM    [facility] [address] [place], [place] [address] Phone: [phone]; Fax: [phone]

## 2017-06-09 NOTE — Progress Notes (Signed)
I reviewed above note and agree with the assessment and plan.  Rosalin Hawking, MD PhD Stroke Neurology 06/09/2017 9:49 PM

## 2017-06-12 ENCOUNTER — Ambulatory Visit (INDEPENDENT_AMBULATORY_CARE_PROVIDER_SITE_OTHER): Payer: Medicaid Other | Admitting: Internal Medicine

## 2017-06-12 ENCOUNTER — Encounter: Payer: Self-pay | Admitting: Internal Medicine

## 2017-06-12 ENCOUNTER — Other Ambulatory Visit: Payer: Self-pay

## 2017-06-12 VITALS — BP 118/80 | HR 88 | Temp 98.1°F | Ht 65.0 in | Wt 167.0 lb

## 2017-06-12 DIAGNOSIS — E1165 Type 2 diabetes mellitus with hyperglycemia: Secondary | ICD-10-CM | POA: Diagnosis present

## 2017-06-12 NOTE — Patient Instructions (Signed)
It was so nice to see you today! I'm so happy that you are doing better and are back and home.  Let's keep the same doses of your diabetes medications for now. We will see you back in 2 months to recheck your A1c.  I have sent in a referral to the eye doctor for your annual exam.  -Dr. Brett Albino

## 2017-06-16 NOTE — Assessment & Plan Note (Signed)
>>  ASSESSMENT AND PLAN FOR UNCONTROLLED TYPE 2 DIABETES MELLITUS WITH HYPERGLYCEMIA ([facility]) WRITTEN ON 06/16/2017  7:15 PM BY MAYO, KATY DODD, MD  Uncontrolled. Last A1c 13.3. CBGs have been improving since starting the Trulicity. - Continue Lantus 30 units daily and Novolog per sliding scale - Continue Metformin 1000mg  bid and Trulicity 0.75mg  weekly - Diabetic foot exam performed today and was normal - Ophthalmology referral placed for annual diabetic eye exam - Follow-up in 2 months to recheck A1c. Patient will call me before then if she starts having elevated blood sugars

## 2017-06-16 NOTE — Progress Notes (Signed)
   [facility] Phone: [phone]  Subjective:  Holly Acosta is a 62 year old female presenting to clinic for follow-up of her diabetes. She was just discharged home from SNF. Checking blood sugars a couple times per day. CBGs have been in the 140s. Taking Lantus 30 units daily and Novolog per sliding scale. Also taking started Trulicity while at SNF. States that both her and her granddaughter do not know how to administer the Trulicity since the RN has always given it to her. No low blood sugars, no polyuria, no polydipsia.  ROS: See HPI for pertinent positives and negatives  Past Medical History- CAD s/p DES x 1 to LCx, hx CVA, HTN, T2DM, HLD, OSA  Family history reviewed for today's visit. No changes.  Social history- patient is a former smoker, quit in 2008.  Objective: BP 118/80   Pulse 88   Temp 98.1 F (36.7 C) (Oral)   Ht 5\' 5"  (1.651 m)   Wt 167 lb (75.8 kg)   SpO2 96%   BMI 27.79 kg/m  Gen: NAD, alert, cooperative with exam HEENT: NCAT, EOMI, MMM Neck: FROM, supple CV: RRR, no murmur Resp: CTABL, no wheezes, normal work of breathing GI: SNTND, BS present, no guarding or organomegaly Msk: No edema, warm, normal tone, moves UE/LE spontaneously Feet: No deformities, no ulcerations, no other skin breakdown bilaterally; sensation intact to touch and monofilament testing bilaterally; PT and DP pulses intact bilaterally  Assessment/Plan: T2DM: Uncontrolled. Last A1c 13.3. CBGs have been improving since starting the Trulicity. - Continue Lantus 30 units daily and Novolog per sliding scale - Continue Metformin 1000mg  bid and Trulicity 0.75mg  weekly - Diabetic foot exam performed today and was normal - Ophthalmology referral placed for annual diabetic eye exam - Follow-up in 2 months to recheck A1c. Patient will call me before then if she starts having elevated blood sugars   Hyman Bible, MD PGY-3

## 2017-06-16 NOTE — Assessment & Plan Note (Signed)
Uncontrolled. Last A1c 13.3. CBGs have been improving since starting the Trulicity. - Continue Lantus 30 units daily and Novolog per sliding scale - Continue Metformin 1000mg  bid and Trulicity 0.75mg  weekly - Diabetic foot exam performed today and was normal - Ophthalmology referral placed for annual diabetic eye exam - Follow-up in 2 months to recheck A1c. Patient will call me before then if she starts having elevated blood sugars

## 2017-06-29 ENCOUNTER — Telehealth (HOSPITAL_COMMUNITY): Payer: Self-pay

## 2017-06-29 NOTE — Telephone Encounter (Signed)
Called and spoke with patient in regards to Cardiac Rehab - Patient stated she will need to talk with her Granddaughter about a ride. She stated she will get back with me. If no phone call from patient, will follow up.

## 2017-06-30 LAB — HM DIABETES EYE EXAM

## 2017-07-03 ENCOUNTER — Encounter: Payer: Self-pay | Admitting: Internal Medicine

## 2017-07-08 ENCOUNTER — Telehealth (HOSPITAL_COMMUNITY): Payer: Self-pay

## 2017-07-08 NOTE — Telephone Encounter (Signed)
Attempted to call patient in regards to Cardiac Rehab - LM on VM 

## 2017-07-20 ENCOUNTER — Telehealth (HOSPITAL_COMMUNITY): Payer: Self-pay

## 2017-07-20 NOTE — Telephone Encounter (Signed)
Called and spoke with patient in regards to Cardiac Rehab - Scheduled orientation on 08/25/17 at 8:30am. Patient will attend the 2:45pm exc class. Mailed packet.

## 2017-07-22 ENCOUNTER — Other Ambulatory Visit: Payer: Self-pay

## 2017-07-28 MED ORDER — INSULIN GLARGINE 100 UNIT/ML SOLOSTAR PEN: PEN_INJECTOR | SUBCUTANEOUS | 0 refills | Status: DC

## 2017-07-28 NOTE — Telephone Encounter (Signed)
Refill sent per standing order. Danley Danker, RN [facility] RN)

## 2017-07-28 NOTE — Telephone Encounter (Signed)
VM left for pt to call back to schedule with new pcp. Please assist her in scheduling with H. Anderson.

## 2017-07-28 NOTE — Telephone Encounter (Signed)
Please have Ms. Kimmons make an appt ASAP per Dr. Brett Albino to recheck HbA1c and meet me as her new PCP. Thanks!

## 2017-08-03 ENCOUNTER — Other Ambulatory Visit: Payer: Self-pay | Admitting: *Deleted

## 2017-08-03 MED ORDER — ACCU-CHEK FASTCLIX LANCETS MISC: 1.0000 | Freq: Every day | 3 refills | Status: DC

## 2017-08-03 MED ORDER — GLUCOSE BLOOD VI STRP
ORAL_STRIP | 12 refills | Status: DC
Start: 2017-08-03 — End: 2017-11-04

## 2017-08-03 NOTE — Telephone Encounter (Signed)
Patient has an appointment in august to meet new PCP but will need more testing supplies before then.  Will forward to MD. Johnney Ou

## 2017-08-10 ENCOUNTER — Other Ambulatory Visit: Payer: Self-pay | Admitting: *Deleted

## 2017-08-11 MED ORDER — INSULIN PEN NEEDLE 31G X 5 MM MISC: 1.0000 | Freq: Two times a day (BID) | 1 refills | Status: DC | PRN

## 2017-08-11 NOTE — Telephone Encounter (Signed)
2nd request. Holly Acosta,CMA  

## 2017-08-22 ENCOUNTER — Telehealth (HOSPITAL_COMMUNITY): Payer: Self-pay | Admitting: Pharmacy Technician

## 2017-08-22 NOTE — Telephone Encounter (Signed)
Cardiac Rehab Medication Review by a Pharmacist  Does the patient  feel that his/her medications are working for him/her?  yes  Has the patient been experiencing any side effects to the medications prescribed?  no  Does the patient measure his/her own blood pressure or blood glucose at home?  no   Does the patient have any problems obtaining medications due to transportation or finances?   no  Understanding of regimen: good Understanding of indications: good Potential of compliance: good   Pharmacist comments: Patient has good understanding of medication regimen but does not check BP at home.   Mills Koller, [facility]

## 2017-08-25 ENCOUNTER — Telehealth (HOSPITAL_COMMUNITY): Payer: Self-pay

## 2017-08-25 ENCOUNTER — Encounter (HOSPITAL_COMMUNITY): Admission: RE | Admit: 2017-08-25 | Payer: Medicaid Other | Source: Ambulatory Visit

## 2017-08-25 NOTE — Telephone Encounter (Signed)
Patient stated she missed her orientation appt due to transportation. She will come in tomorrow to sign paperwork to sign up for SK[facility].

## 2017-08-31 ENCOUNTER — Ambulatory Visit (HOSPITAL_COMMUNITY): Payer: Medicaid Other

## 2017-09-02 ENCOUNTER — Ambulatory Visit (HOSPITAL_COMMUNITY): Payer: Medicaid Other

## 2017-09-04 ENCOUNTER — Other Ambulatory Visit: Payer: Self-pay

## 2017-09-04 ENCOUNTER — Encounter: Payer: Self-pay | Admitting: Family Medicine

## 2017-09-04 ENCOUNTER — Ambulatory Visit (HOSPITAL_COMMUNITY): Payer: Medicaid Other

## 2017-09-04 ENCOUNTER — Ambulatory Visit: Payer: Medicaid Other | Admitting: Family Medicine

## 2017-09-04 ENCOUNTER — Telehealth: Payer: Self-pay

## 2017-09-04 VITALS — BP 106/68 | HR 69 | Temp 98.1°F | Wt 169.8 lb

## 2017-09-04 DIAGNOSIS — Z0000 Encounter for general adult medical examination without abnormal findings: Secondary | ICD-10-CM | POA: Diagnosis not present

## 2017-09-04 DIAGNOSIS — E1165 Type 2 diabetes mellitus with hyperglycemia: Secondary | ICD-10-CM

## 2017-09-04 LAB — POCT GLYCOSYLATED HEMOGLOBIN (HGB A1C): HBA1C, POC (CONTROLLED DIABETIC RANGE): 9.3 % — AB (ref 0.0–7.0)

## 2017-09-04 MED ORDER — DULAGLUTIDE 0.75 MG/0.5ML ~~LOC~~ SOAJ: SUBCUTANEOUS | 2 refills | Status: DC

## 2017-09-04 NOTE — Telephone Encounter (Signed)
Received fax from [place] requesting prior authorization of Trulicity.  Form placed in PCPs box for completion along with Medicaid formulary.   Danley Danker, RN [facility] RN)

## 2017-09-04 NOTE — Patient Instructions (Signed)
Thank you for coming in to see [place] today. Please see below to review our plan for today's visit.  1. Continue to check you blood sugar 3 times daily with meals. 2. Aim to keep blood sugar below 150 every morning. 3. If the blood sugar is above 150 for two mornings in a row, increase your dose of Lantus by once unit (for example, from 30 to 31).  4. If the blood sugar falls below 90, or you wake up nauseous, sweating, or generally not feeling well, decrease the dose of Lantus by once unit. 5. You are due for a mammogram - please have this exam done soon! 6. Please come back in about 1 month for follow up of blood sugars, pap smear, and potentially a flu shot if the are available.   Please call the clinic at [phone] if your symptoms worsen or you have any concerns. It was our pleasure to serve you!  Dr. Milus Banister [facility], PGY-1 09/04/2017 2:32 PM

## 2017-09-04 NOTE — Progress Notes (Signed)
   Subjective:    Patient ID: Holly Acosta, female    DOB: Nov 28, 1955, 62 y.o.   MRN: [id number]   CC: Diabetes Check up  HPI: Sherial Ebrahim is a 62 year old female presenting for follow up of diabetes and HbA1c check.  She reports doing well and reports her blood sugars have been between 170 and 180, close to 200 every morning.  She continues her Lantus and sliding scale insulin with metformin.  He denies having episodes of hypoglycemia.  She reports having occasional blurred vision, which is short lasting and always self resolves.  Otherwise she denies headaches, chest pain, shortness of breath, neuropathy, polyphagia, abdominal pain, dysuria, and frequency.  She reports she recently visited the ophthalmologist and had a hole in her eye that required treatment with the laser.  She also reports needing a refill of Trulicity.  Objective:  Blood pressure 106/68, pulse 69, temperature 98.1 F (36.7 C), temperature source Oral, weight 77 kg, SpO2 98 %.  General: well nourished, in no acute distress HEENT: normocephalic, no scleral icterus or conjunctival pallor, no nasal discharge, moist mucous membranes, good dentition without erythema or discharge noted in posterior oropharynx Neck: supple, non-tender, without lymphadenopathy Cardiac: RRR, clear S1 and S2, no murmurs, rubs, or gallops Respiratory: clear to auscultation bilaterally, no increased work of breathing Abdomen: soft, nontender, nondistended, no masses or organomegaly. Bowel sounds present Extremities: no edema or cyanosis. Warm, well perfused. 2+ radial and PT pulses bilaterally Skin: warm and dry, no rashes noted Neuro: alert and oriented, no focal deficits. No signs of ulceration or skin breakdown on bilateral feet.  Sensation completely intact to feet bilaterally.   Assessment & Plan:  Diabetes: Recent HbA1c was down to 9.3% from 13.3%.  1. Continue to check blood sugar 3 times daily with meals. 2. Aim to keep blood sugar below  150 every morning. 3. If the blood sugar is above 150 for two mornings in a row, increase dose of Lantus by one unit (for example, from 30 to 31).  4. If the blood sugar falls below 90, or patient wakes up nauseous, sweating, or generally not feeling well, decrease the dose of Lantus by one unit. 5. Patient is due for mammogram, this was ordered for her today. 6. Patient was instructed to return in 1 month for follow up of blood sugars, pap smear, and potentially a flu shot if these are available.   Milus Banister, [place], PGY-1

## 2017-09-07 ENCOUNTER — Ambulatory Visit (HOSPITAL_COMMUNITY): Payer: Medicaid Other

## 2017-09-07 ENCOUNTER — Other Ambulatory Visit: Payer: Self-pay | Admitting: Family Medicine

## 2017-09-07 DIAGNOSIS — E1165 Type 2 diabetes mellitus with hyperglycemia: Secondary | ICD-10-CM

## 2017-09-07 MED ORDER — EXENATIDE ER 2 MG ~~LOC~~ PEN: 2.0000 mg | PEN_INJECTOR | SUBCUTANEOUS | 0 refills | Status: DC

## 2017-09-08 MED ORDER — EXENATIDE ER 2 MG ~~LOC~~ PEN: 2.0000 mg | PEN_INJECTOR | SUBCUTANEOUS | 0 refills | Status: DC

## 2017-09-09 ENCOUNTER — Other Ambulatory Visit: Payer: Self-pay | Admitting: Family Medicine

## 2017-09-09 ENCOUNTER — Telehealth (HOSPITAL_COMMUNITY): Payer: Self-pay

## 2017-09-09 ENCOUNTER — Ambulatory Visit (HOSPITAL_COMMUNITY): Payer: Medicaid Other

## 2017-09-09 DIAGNOSIS — Z1231 Encounter for screening mammogram for malignant neoplasm of breast: Secondary | ICD-10-CM

## 2017-09-09 NOTE — Telephone Encounter (Signed)
Called to follow up with patient in regards to SCAT - Patient has appt with [facility] on 09/17/17 at 10:00am. I will follow up with patient once appt has been completed.

## 2017-09-11 ENCOUNTER — Ambulatory Visit (HOSPITAL_COMMUNITY): Payer: Medicaid Other

## 2017-09-14 ENCOUNTER — Ambulatory Visit (HOSPITAL_COMMUNITY): Payer: Medicaid Other

## 2017-09-16 ENCOUNTER — Ambulatory Visit (HOSPITAL_COMMUNITY): Payer: Medicaid Other

## 2017-09-17 ENCOUNTER — Telehealth (HOSPITAL_COMMUNITY): Payer: Self-pay

## 2017-09-17 NOTE — Telephone Encounter (Signed)
Called patient to reschedule for CR. Patient will come in for orientation on 10/29/17 @ 1:30PM and will attend the 2:45PM exercise class.  Mailed homework package.

## 2017-09-18 ENCOUNTER — Ambulatory Visit (HOSPITAL_COMMUNITY): Payer: Medicaid Other

## 2017-09-23 ENCOUNTER — Ambulatory Visit (HOSPITAL_COMMUNITY): Payer: Medicaid Other

## 2017-09-25 ENCOUNTER — Ambulatory Visit (HOSPITAL_COMMUNITY): Payer: Medicaid Other

## 2017-09-28 ENCOUNTER — Ambulatory Visit (HOSPITAL_COMMUNITY): Payer: Medicaid Other

## 2017-09-30 ENCOUNTER — Ambulatory Visit (HOSPITAL_COMMUNITY): Payer: Medicaid Other

## 2017-10-02 ENCOUNTER — Ambulatory Visit (HOSPITAL_COMMUNITY): Payer: Medicaid Other

## 2017-10-05 ENCOUNTER — Ambulatory Visit (HOSPITAL_COMMUNITY): Payer: Medicaid Other

## 2017-10-05 ENCOUNTER — Ambulatory Visit
Admission: RE | Admit: 2017-10-05 | Discharge: 2017-10-05 | Disposition: A | Discharge status: Home / Self Care | Payer: Medicaid Other | Source: Ambulatory Visit | Attending: Family Medicine | Admitting: Family Medicine

## 2017-10-05 DIAGNOSIS — Z1231 Encounter for screening mammogram for malignant neoplasm of breast: Secondary | ICD-10-CM

## 2017-10-06 ENCOUNTER — Ambulatory Visit: Payer: Medicaid Other | Admitting: Adult Health

## 2017-10-06 ENCOUNTER — Encounter: Payer: Self-pay | Admitting: Adult Health

## 2017-10-06 VITALS — BP 126/77 | HR 69 | Ht 65.0 in | Wt 174.4 lb

## 2017-10-06 DIAGNOSIS — G4733 Obstructive sleep apnea (adult) (pediatric): Secondary | ICD-10-CM | POA: Diagnosis not present

## 2017-10-06 DIAGNOSIS — E1169 Type 2 diabetes mellitus with other specified complication: Secondary | ICD-10-CM | POA: Diagnosis not present

## 2017-10-06 DIAGNOSIS — E1165 Type 2 diabetes mellitus with hyperglycemia: Secondary | ICD-10-CM

## 2017-10-06 DIAGNOSIS — I63532 Cerebral infarction due to unspecified occlusion or stenosis of left posterior cerebral artery: Secondary | ICD-10-CM

## 2017-10-06 DIAGNOSIS — I10 Essential (primary) hypertension: Secondary | ICD-10-CM

## 2017-10-06 DIAGNOSIS — E785 Hyperlipidemia, unspecified: Secondary | ICD-10-CM

## 2017-10-06 NOTE — Progress Notes (Signed)
[facility] [address] [place]. [place] [address] [phone]       OFFICE FOLLOW UP NOTE  Holly Acosta Date of Birth:  [birth year] Medical Record Number:  [id number]   Reason for visit: Stroke follow up  CHIEF COMPLAINT:  Chief Complaint  Patient presents with  . Follow-up    4 month follow up. Alone. Rm 9. No new changes or concerns at this time.    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 History obtained from patient and chart review. Reviewed all radiology images and labs personally. Holly Acosta is a 62 year old female with history of diabetes, hypertension, hyperlipidemia, OSA on CPAP, and obesity admitted on 04/27/17 for non-STEMI status post stent, currently on aspirin and Brilinta for cardio prevention.  Postprocedure, patient found to be lethargic, drowsy and sleepy.  Had CT head showed left PICA large infarct.  MRI  confirmed left PICA infarct, no hydrocephalus.  CT head and neck unremarkable except left PICA occlusion, consistent with current infarct.  EF 55-60% with multi region hypokinesis.  A1c is 13.3 and LDL 107. Patients mental status improved without focal neurological deficit, no ataxia or dysmetria during hospitalization. Patients stroke likely due to STEMI versus cardiac procedure.  However, patient had multiple stroke risk factors including uncontrolled diabetes, CAD, hyperlipidemia, obesity and OSA.  Recommend continue aspirin and Brilinta for stroke and cardiac prevention along with stroke risk factor modification.  PT/OT recommended SNF. Patient discharged in stable condition .  06/05/2017 visit: Patient is being seen today for follow-up visit and is accompanied by her granddaughter.  Overall patient is doing well from a stroke standpoint and does not have residual deficits.  Continues to take Brilinta  and aspirin without side effects of bleeding or bruising.  Continues to take Crestor without side effects of myalgias.  Blood pressure today satisfactory 122/71.  Patient did return home from SNF yesterday and believes they will be therapies that will be sent out next week.  Patient is able to do all ADLs and IADLs and does live currently with her granddaughter.  She is currently sitting in wheelchair in which she uses for long distance only and uses a rolling walker in her house.  She does have a history of OSA and was on a CPAP machine prior but as this broke, per insurance she needs a new sleep study and has a current referral from her PCP to this office.  She does complain of occasional right arm pain especially while she is laying on it and questioning whether she can take Tylenol as needed for this pain.  She is following up with cardiology on Monday.  Denies new or worsening stroke/TIA symptoms.  Interval history 10/06/2017: Patient is being seen today for scheduled follow-up appointment.  Recent A1c 9.3 on 09/04/2017.  PCP continues to manage DM.  Patient states overall she is been doing well.  She continues to take both aspirin and Brilinta post cardiac stent without side effects of bleeding or bruising.  She continues to follow with her cardiologist for management.  She will be starting cardiac rehab in October.  Starting of rehab was delayed due to transportation issues but has recently obtained Assistance.  Continues to take Crestor without side effects of myalgias.  Blood pressure today satisfactory 126/77.  PCP did place order for split-night study but this was not  obtained and patient states she was not called.  She continues to be unable to use her CPAP as she needs a repeat study in order for insurance to approve new machine as hers is currently not functioning correctly.  She has been able to use CPAP for over 1 year.  She continues to live with her granddaughter but is able to maintain all ADLs  independently.  She is currently ambulating with her rolling walker and denies recent falls.  Denies new or worsening stroke/TIA symptoms.     ROS:   14 system review of systems performed and negative with exception of no complaints  PMH:  Past Medical History:  Diagnosis Date  . Acute cerebrovascular accident (CVA) of cerebellum ([place]) 04/29/2017  . Anemia   . Arthritis of knee    "both knees" (04/24/2017)  . Coronary artery disease involving native coronary artery of native heart with unstable angina pectoris ([place]) 04/30/2017  . Diabetes mellitus, type 2 ([place])   . History of blood transfusion    "related to prolapsed uterus"  . HLD (hyperlipidemia)   . HTN (hypertension)   . Non-ST elevation (NSTEMI) myocardial infarction ([place])   . Prolapsed uterus   . Sleep apnea    "have mask; haven't worn it for a long time" (04/24/2017)  . Status post coronary artery stent placement     PSH:  Past Surgical History:  Procedure Laterality Date  . CARDIAC CATHETERIZATION  2011  . CORONARY STENT INTERVENTION N/A 04/27/2017   Procedure: CORONARY STENT INTERVENTION;  Surgeon: Jettie Booze, MD;  Location: [facility];  Service: Cardiovascular;  Laterality: N/A;  . LEFT HEART CATH AND CORONARY ANGIOGRAPHY N/A 04/27/2017   Procedure: LEFT HEART CATH AND CORONARY ANGIOGRAPHY;  Surgeon: Jettie Booze, MD;  Location: [facility];  Service: Cardiovascular;  Laterality: N/A;  . TUBAL LIGATION      Social History:  Social History   Socioeconomic History  . Marital status: Divorced    Spouse name: Not on file  . Number of children: Not on file  . Years of education: Not on file  . Highest education level: Not on file  Occupational History  . Not on file  Social Needs  . Financial resource strain: Not on file  . Food insecurity:    Worry: Not on file    Inability: Not on file  . Transportation needs:    Medical: Not on file    Non-medical: Not on file  Tobacco Use  .  Smoking status: Former Smoker    Packs/day: 1.00    Years: 33.00    Pack years: 33.00    Types: Cigarettes    Start date: 01/20/1973    Last attempt to quit: 11/04/2006    Years since quitting: 10.9  . Smokeless tobacco: Never Used  Substance and Sexual Activity  . Alcohol use: Never    Alcohol/week: 0.0 standard drinks    Frequency: Never  . Drug use: Never  . Sexual activity: Not Currently  Lifestyle  . Physical activity:    Days per week: Not on file    Minutes per session: Not on file  . Stress: Not on file  Relationships  . Social connections:    Talks on phone: Not on file    Gets together: Not on file    Attends religious service: Not on file    Active member of club or organization: Not on file    Attends meetings of clubs or  organizations: Not on file    Relationship status: Not on file  . Intimate partner violence:    Fear of current or ex partner: Not on file    Emotionally abused: Not on file    Physically abused: Not on file    Forced sexual activity: Not on file  Other Topics Concern  . Not on file  Social History Narrative   Lives with daughter [name] age 17) and 4 grandchildren.   Disability - Arthritis. Does get disability check.   Does not have a living will yet    Family History:  Family History  Problem Relation Age of Onset  . Diabetes Mother   . Hypertension Mother   . Hypertension Father     Medications:   Current Outpatient Medications on File Prior to Visit  Medication Sig Dispense Refill  . ACCU-CHEK FASTCLIX LANCETS MISC 1 each by Does not apply route daily. Check sugar daily as needed 102 each 3  . acetaminophen (TYLENOL) 500 MG tablet Take 1,000 mg by mouth every 12 (twelve) hours as needed for mild pain.     Marland Kitchen amLODipine (NORVASC) 5 MG tablet Take 1 tablet (5 mg total) by mouth daily. 90 tablet 3  . aspirin EC 81 MG tablet Take 1 tablet (81 mg total) by mouth daily. 90 tablet 3  . Blood Glucose Monitoring Suppl (ACCU-CHEK NANO  SMARTVIEW) w/Device KIT Use to test blood sugar up to 3 times a day. ICD-10 code: E11.9 1 kit 0  . carvedilol (COREG) 12.5 MG tablet TAKE 1 TABLET BY MOUTH TWICE A DAY WITH MEALS 180 tablet 0  . Dulaglutide (TRULICITY) 1.01 BP/1.0CH SOPN INJECT 0.75 MG INTO THE SKIN ONCE A WEEK. 4 pen 2  . Exenatide ER 2 MG PEN Inject 2 mg into the skin once a week. 4 each 0  . glucose blood (ACCU-CHEK AVIVA) test strip Use as instructed 100 each 12  . glucose blood (ACCU-CHEK SMARTVIEW) test strip Use as instructed 100 each 12  . insulin aspart (NOVOLOG) 100 UNIT/ML injection Inject 2-8 Units into the skin See admin instructions. Sliding scale: 201-250: 2 units; 251-300: 4 units; 301-350: 6 units; 351-400: 8 units.    . Insulin Glargine (LANTUS SOLOSTAR) 100 UNIT/ML Solostar Pen INJECT 50 UNITS INTO THE SKIN IN THE MORNING (Patient taking differently: Inject 30 Units into the skin daily. INJECT 30 UNITS INTO THE SKIN IN THE MORNING) 45 pen 0  . Insulin Pen Needle (B-D UF III MINI PEN NEEDLES) 31G X 5 MM MISC 1 each by Does not apply route 3 times/day as needed-between meals & bedtime. 100 each 1  . lisinopril (PRINIVIL,ZESTRIL) 20 MG tablet Take 1 tablet (20 mg total) by mouth daily. 90 tablet 3  . metFORMIN (GLUCOPHAGE) 1000 MG tablet TAKE 1 TABLET BY MOUTH TWICE DAILY WITH A MEAL Resume on 04/30/17. 60 tablet 2  . NOVOLOG FLEXPEN 100 UNIT/ML FlexPen INJECT 20 UNITS INTO THE SKIN 3 TIMES DAILY WITH MEALS. (Patient taking differently: Inject subcutaneously as directed per sliding scale. Sliding scale: 201-250: 2 units; 251-300: 4 units; 301-350: 6 units; 351-400: 8 units.) 15 pen 2  . ondansetron (ZOFRAN-ODT) 4 MG disintegrating tablet Take 4 mg by mouth daily as needed for nausea or vomiting.    . rosuvastatin (CRESTOR) 40 MG tablet Take 1 tablet (40 mg total) by mouth daily. 90 tablet 3  . ticagrelor (BRILINTA) 90 MG TABS tablet Take 1 tablet (90 mg total) by mouth 2 (two) times daily. 180 tablet  3   No current  facility-administered medications on file prior to visit.     Allergies:   Allergies  Allergen Reactions  . Lipitor [Atorvastatin] Other (See Comments)    Cramping in feet that resolved after stopping med  . Diclofenac Nausea Only and Other (See Comments)    Caused a lot of stomach pain      Physical Exam  Vitals:   10/06/17 1254  BP: 126/77  Pulse: 69  Weight: 174 lb 7.2 oz (79.1 kg)  Height: 5' 5" (1.651 m)   Body mass index is 29.03 kg/m. No exam data present  General: well developed, pleasant obese Caucasian female, well nourished, seated, in no evident distress Head: head normocephalic and atraumatic.   Neck: supple with no carotid or supraclavicular bruits Cardiovascular: regular rate and rhythm, no murmurs Musculoskeletal: no deformity Skin:  no rash/petichiae Vascular:  Normal pulses all extremities  Neurologic Exam Mental Status: Awake and fully alert. Oriented to place and time. Recent and remote memory intact. Attention span, concentration and fund of knowledge appropriate. Mood and affect appropriate.  Cranial Nerves: Fundoscopic exam reveals sharp disc margins. Pupils equal, briskly reactive to light. Extraocular movements full without nystagmus. Visual fields full to confrontation. Hearing intact. Facial sensation intact. Face, tongue, palate moves normally and symmetrically.  Motor: Normal bulk and tone. Normal strength in all tested extremity muscles. Sensory.: intact to touch , pinprick , position and vibratory sensation.  Coordination: Rapid alternating movements normal in all extremities. Finger-to-nose and heel-to-shin performed accurately bilaterally. Gait and Station: Arises from chair without difficulty. Stance is normal. Gait demonstrates normal stride length and balance with assistance of rolling walker. Unable to heel, toe and tandem walk without difficulty.  Reflexes: 1+ and symmetric. Toes downgoing.      Diagnostic Data (Labs, Imaging,  Testing)  CT head 04/28/2017 IMPRESSION: Acute to early subacute left posterior inferior cerebellar artery territory infarct without hemorrhage. Mild mass effect on the fourth ventricle, but no hydrocephalus.  MRI brain 04/29/2017 IMPRESSION: 1. 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 Associated mild petechial hemorrhage without frank hemorrhagic transformation. Localized edema with mild mass effect on the fourth ventricular outflow tract which remains patent at this time. No hydrocephalus. 2. Additional 8 mm acute ischemic nonhemorrhagic right cerebellar infarct. 3. Moderate nonspecific cerebral white matter changes, most likely related to chronic small vessel ischemic disease.  CT angio neck CT angio head 04/29/2017 IMPRESSION: 1. Mid left PICA occlusion correlating with known acute infarct. No underlying embolic source or flow limiting stenosis identified. 2. Overall mild intracranial and cervical atherosclerosis  Echocardiogram 04/29/2017 Impressions:  - There is hypokinesis of the basal inferior, inferolateral and mid   inferolateral walls, overall LVEF is preserved at 55-60%.    ASSESSMENT: CHARNA NEEB is a 62 y.o. year old female here with left cerebellar/left PICA infarcts on 04/28/2017 secondary to post cardiac procedure. Vascular risk factors include HTN, CAD, HLD, DM and OSA.  Patient returns today for follow-up appointment and overall is doing well from a stroke standpoint without residual deficits or recurrence of symptoms.    PLAN: -Continue aspirin 81 mg daily and Brilinta  and Crestor for secondary stroke prevention -F/u with PCP regarding your HLD, HTN, CAD, and DM management -f/u with cardiologist as scheduled for post cardiac stent placement monitoring along with duration of aspirin and Brilinta -Referral placed to [facility] for OSA management -continue to monitor BP at home -Advised to continue to stay active  and maintain a healthy diet -Maintain strict control of hypertension with blood pressure goal below 130/90, diabetes with hemoglobin A1c goal below 6.5% and cholesterol with LDL cholesterol (bad cholesterol) goal below 70 mg/dL. I also advised the patient to eat a healthy diet with plenty of whole grains, cereals, fruits and vegetables, exercise regularly and maintain ideal body weight.  Follow up in 6 months or call earlier if needed   Greater than 50% of time during this 25 minute visit was spent on counseling,explanation of diagnosis of left cerebellar/left PICA infarcts, reviewing risk factor management of HLD, HTN, CAD, DM, and OSA, planning of further management, discussion with patient and family and coordination of care  Venancio Poisson, [facility]  [facility] [address] [place], [place] [address]  Phone [phone] Fax [phone]

## 2017-10-06 NOTE — Patient Instructions (Addendum)
Continue aspirin 81 mg daily and brilinta  and Crestor  for secondary stroke prevention  Continue to follow up with PCP regarding cholesterol, blood pressure and diabetes management   Continue to follow up with cardiology as scheduled with continued management along with duration of asa and Brilinta   You will be called to schedule office visit with one of our sleep providers for sleep apnea testing  Continue to stay active and maintain a healthy diet  Continue to monitor blood pressure at home  Maintain strict control of hypertension with blood pressure goal below 130/90, diabetes with hemoglobin A1c goal below 6.5% and cholesterol with LDL cholesterol (bad cholesterol) goal below 70 mg/dL. I also advised the patient to eat a healthy diet with plenty of whole grains, cereals, fruits and vegetables, exercise regularly and maintain ideal body weight.  Followup in the future with me in 6 months or call earlier if needed       Thank you for coming to see [place] at [facility]. I hope we have been able to provide you high quality care today.  You may receive a patient satisfaction survey over the next few weeks. We would appreciate your feedback and comments so that we may continue to improve ourselves and the health of our patients.

## 2017-10-06 NOTE — Progress Notes (Signed)
I agree with the above plan 

## 2017-10-07 ENCOUNTER — Ambulatory Visit (HOSPITAL_COMMUNITY): Payer: Medicaid Other

## 2017-10-09 ENCOUNTER — Ambulatory Visit (HOSPITAL_COMMUNITY): Payer: Medicaid Other

## 2017-10-12 ENCOUNTER — Ambulatory Visit (HOSPITAL_COMMUNITY): Payer: Medicaid Other

## 2017-10-12 ENCOUNTER — Other Ambulatory Visit: Payer: Self-pay | Admitting: Family Medicine

## 2017-10-14 ENCOUNTER — Ambulatory Visit (HOSPITAL_COMMUNITY): Payer: Medicaid Other

## 2017-10-16 ENCOUNTER — Ambulatory Visit (HOSPITAL_COMMUNITY): Payer: Medicaid Other

## 2017-10-19 ENCOUNTER — Ambulatory Visit (HOSPITAL_COMMUNITY): Payer: Medicaid Other

## 2017-10-20 ENCOUNTER — Other Ambulatory Visit: Payer: Self-pay | Admitting: *Deleted

## 2017-10-20 MED ORDER — CARVEDILOL 12.5 MG PO TABS: 12.5000 mg | ORAL_TABLET | Freq: Two times a day (BID) | ORAL | 3 refills | Status: DC

## 2017-10-21 ENCOUNTER — Ambulatory Visit (HOSPITAL_COMMUNITY): Payer: Medicaid Other

## 2017-10-23 ENCOUNTER — Ambulatory Visit (HOSPITAL_COMMUNITY): Payer: Medicaid Other

## 2017-10-26 ENCOUNTER — Ambulatory Visit (HOSPITAL_COMMUNITY): Payer: Medicaid Other

## 2017-10-26 ENCOUNTER — Telehealth (HOSPITAL_COMMUNITY): Payer: Self-pay

## 2017-10-26 ENCOUNTER — Telehealth (HOSPITAL_COMMUNITY): Payer: Self-pay | Admitting: Pharmacist

## 2017-10-26 NOTE — Telephone Encounter (Signed)
**  UPDATED**  Patient Medicaid is still active.

## 2017-10-26 NOTE — Telephone Encounter (Signed)
Cardiac Rehab Medication Review by a Pharmacist  Does the patient  feel that his/her medications are working for him/her?  yes  Has the patient been experiencing any side effects to the medications prescribed?  yes  Does the patient measure his/her own blood pressure or blood glucose at home?  yes   Does the patient have any problems obtaining medications due to transportation or finances?   no  Understanding of regimen: good Understanding of indications: good Potential of compliance: good    Pharmacist comments: Patient is adherent to medication and has no issues with side effects.   Thank you for allowing pharmacy to be a part of this patient's care.  Tamela Gammon, PharmD 10/26/2017 5:28 PM PGY-1 Pharmacy Resident Direct Phone: [phone] Please check [email or web address] for unit-specific pharmacist phone numbers

## 2017-10-28 ENCOUNTER — Ambulatory Visit (HOSPITAL_COMMUNITY): Payer: Medicaid Other

## 2017-10-29 ENCOUNTER — Encounter (HOSPITAL_COMMUNITY)
Admission: RE | Admit: 2017-10-29 | Discharge: 2017-10-29 | Disposition: A | Discharge status: Home / Self Care | Payer: Medicaid Other | Source: Ambulatory Visit | Attending: Cardiology | Admitting: Cardiology

## 2017-10-29 VITALS — BP 110/60 | HR 96 | Ht 64.5 in | Wt 160.9 lb

## 2017-10-29 DIAGNOSIS — E785 Hyperlipidemia, unspecified: Secondary | ICD-10-CM | POA: Insufficient documentation

## 2017-10-29 DIAGNOSIS — G4730 Sleep apnea, unspecified: Secondary | ICD-10-CM | POA: Diagnosis not present

## 2017-10-29 DIAGNOSIS — I252 Old myocardial infarction: Secondary | ICD-10-CM | POA: Insufficient documentation

## 2017-10-29 DIAGNOSIS — I10 Essential (primary) hypertension: Secondary | ICD-10-CM | POA: Diagnosis not present

## 2017-10-29 DIAGNOSIS — Z955 Presence of coronary angioplasty implant and graft: Secondary | ICD-10-CM | POA: Diagnosis not present

## 2017-10-29 DIAGNOSIS — Z794 Long term (current) use of insulin: Secondary | ICD-10-CM | POA: Diagnosis not present

## 2017-10-29 DIAGNOSIS — Z79899 Other long term (current) drug therapy: Secondary | ICD-10-CM | POA: Insufficient documentation

## 2017-10-29 DIAGNOSIS — E119 Type 2 diabetes mellitus without complications: Secondary | ICD-10-CM | POA: Diagnosis not present

## 2017-10-29 DIAGNOSIS — Z87891 Personal history of nicotine dependence: Secondary | ICD-10-CM | POA: Insufficient documentation

## 2017-10-29 DIAGNOSIS — Z7982 Long term (current) use of aspirin: Secondary | ICD-10-CM | POA: Insufficient documentation

## 2017-10-29 DIAGNOSIS — M1990 Unspecified osteoarthritis, unspecified site: Secondary | ICD-10-CM | POA: Diagnosis not present

## 2017-10-29 DIAGNOSIS — Z7902 Long term (current) use of antithrombotics/antiplatelets: Secondary | ICD-10-CM | POA: Diagnosis not present

## 2017-10-29 DIAGNOSIS — I214 Non-ST elevation (NSTEMI) myocardial infarction: Secondary | ICD-10-CM

## 2017-10-29 DIAGNOSIS — Z8673 Personal history of transient ischemic attack (TIA), and cerebral infarction without residual deficits: Secondary | ICD-10-CM | POA: Diagnosis not present

## 2017-10-29 NOTE — Progress Notes (Addendum)
Cardiac Individual Treatment Plan  Patient Details  Name: Holly Acosta MRN: [id number] Date of Birth: May 24, 1955 Referring Provider:     CARDIAC REHAB PHASE II ORIENTATION from 10/29/2017 in [employer]  Referring Provider  Dr. Peter Martinique, MD       Initial Encounter Date:    CARDIAC REHAB PHASE II ORIENTATION from 10/29/2017 in [place]  Date  10/29/17      Visit Diagnosis: NSTEMI (non-ST elevated myocardial infarction) [facility])  Status post coronary artery stent placement, DES CFX, 04/24/17   Patient's Home Medications on Admission:  Current Outpatient Medications:  .  ACCU-CHEK FASTCLIX LANCETS MISC, 1 each by Does not apply route daily. Check sugar daily as needed, Disp: 102 each, Rfl: 3 .  acetaminophen (TYLENOL) 500 MG tablet, Take 1,000 mg by mouth every 12 (twelve) hours as needed for mild pain. , Disp: , Rfl:  .  amLODipine (NORVASC) 5 MG tablet, Take 1 tablet (5 mg total) by mouth daily., Disp: 90 tablet, Rfl: 3 .  aspirin EC 81 MG tablet, Take 1 tablet (81 mg total) by mouth daily., Disp: 90 tablet, Rfl: 3 .  B-D UF III MINI PEN NEEDLES 31G X 5 MM MISC, USE AS DIRECTED 3 TIMES A DAY, Disp: 100 each, Rfl: 1 .  Blood Glucose Monitoring Suppl (ACCU-CHEK NANO SMARTVIEW) w/Device KIT, Use to test blood sugar up to 3 times a day. ICD-10 code: E11.9, Disp: 1 kit, Rfl: 0 .  carvedilol (COREG) 12.5 MG tablet, Take 1 tablet (12.5 mg total) by mouth 2 (two) times daily with a meal., Disp: 180 tablet, Rfl: 3 .  Dulaglutide (TRULICITY) 4.48 JE/5.6DJ SOPN, INJECT 0.75 MG INTO THE SKIN ONCE A WEEK. (Patient not taking: Reported on 10/26/2017), Disp: 4 pen, Rfl: 2 .  Exenatide ER 2 MG PEN, Inject 2 mg into the skin once a week., Disp: 4 each, Rfl: 0 .  glucose blood (ACCU-CHEK AVIVA) test strip, Use as instructed, Disp: 100 each, Rfl: 12 .  glucose blood (ACCU-CHEK SMARTVIEW) test strip, Use as instructed, Disp: 100 each, Rfl:  12 .  insulin aspart (NOVOLOG) 100 UNIT/ML injection, Inject 2-8 Units into the skin See admin instructions. Sliding scale: 201-250: 2 units; 251-300: 4 units; 301-350: 6 units; 351-400: 8 units., Disp: , Rfl:  .  Insulin Glargine (LANTUS SOLOSTAR) 100 UNIT/ML Solostar Pen, INJECT 50 UNITS INTO THE SKIN IN THE MORNING (Patient taking differently: Inject 30 Units into the skin daily. INJECT 30 UNITS INTO THE SKIN IN THE MORNING), Disp: 45 pen, Rfl: 0 .  lisinopril (PRINIVIL,ZESTRIL) 20 MG tablet, Take 1 tablet (20 mg total) by mouth daily., Disp: 90 tablet, Rfl: 3 .  metFORMIN (GLUCOPHAGE) 1000 MG tablet, TAKE 1 TABLET BY MOUTH TWICE DAILY WITH A MEAL Resume on 04/30/17., Disp: 60 tablet, Rfl: 2 .  NOVOLOG FLEXPEN 100 UNIT/ML FlexPen, INJECT 20 UNITS INTO THE SKIN 3 TIMES DAILY WITH MEALS. (Patient taking differently: Inject subcutaneously as directed per sliding scale. Sliding scale: 201-250: 2 units; 251-300: 4 units; 301-350: 6 units; 351-400: 8 units.), Disp: 15 pen, Rfl: 2 .  ondansetron (ZOFRAN-ODT) 4 MG disintegrating tablet, Take 4 mg by mouth daily as needed for nausea or vomiting., Disp: , Rfl:  .  rosuvastatin (CRESTOR) 40 MG tablet, Take 1 tablet (40 mg total) by mouth daily., Disp: 90 tablet, Rfl: 3 .  ticagrelor (BRILINTA) 90 MG TABS tablet, Take 1 tablet (90 mg total) by mouth 2 (two)  times daily., Disp: 180 tablet, Rfl: 3  Past Medical History: Past Medical History:  Diagnosis Date  . Acute cerebrovascular accident (CVA) of cerebellum ([place]) 04/29/2017  . Anemia   . Arthritis of knee    "both knees" (04/24/2017)  . Coronary artery disease involving native coronary artery of native heart with unstable angina pectoris ([place]) 04/30/2017  . Diabetes mellitus, type 2 ([place])   . History of blood transfusion    "related to prolapsed uterus"  . HLD (hyperlipidemia)   . HTN (hypertension)   . Non-ST elevation (NSTEMI) myocardial infarction ([place])   . Prolapsed uterus   . Sleep apnea    "have  mask; haven't worn it for a long time" (04/24/2017)  . Status post coronary artery stent placement     Tobacco Use: Social History   Tobacco Use  Smoking Status Former Smoker  . Packs/day: 1.00  . Years: 33.00  . Pack years: 33.00  . Types: Cigarettes  . Start date: 01/20/1973  . Last attempt to quit: 11/04/2006  . Years since quitting: 10.9  Smokeless Tobacco Never Used    Labs: Recent Chemical engineer    Labs for ITP Cardiac and Pulmonary Rehab Latest Ref Rng & Units 07/31/2016 04/25/2017 04/28/2017 04/29/2017 09/04/2017   Cholestrol 0 - 200 mg/dL - 230(H) - 173 -   LDLCALC 0 - 99 mg/dL - UNABLE TO CALCULATE IF TRIGLYCERIDE OVER 400 mg/dL - 107(H) -   LDLDIRECT mg/dL - - - - -   HDL >40 mg/dL - 30(L) - 34(L) -   Trlycerides <150 mg/dL - 518(H) - 159(H) -   Hemoglobin A1c 0.0 - 7.0 % 13.7 - - 13.3(H) 9.3(A)   PHART 7.350 - 7.450 - - 7.477(H) - -   PCO2ART 32.0 - 48.0 mmHg - - 34.9 - -   HCO3 20.0 - 28.0 mmol/L - - 25.7 - -   O2SAT % - - 89.2 - -      Capillary Blood Glucose: Lab Results  Component Value Date   GLUCAP 97 05/14/2017   GLUCAP 175 (H) 05/04/2017   GLUCAP 158 (H) 05/04/2017   GLUCAP 190 (H) 05/03/2017   GLUCAP 208 (H) 05/03/2017     Exercise Target Goals: Exercise Program Goal: Individual exercise prescription set using results from initial 6 min walk test and THRR while considering  patient's activity barriers and safety.   Exercise Prescription Goal: Initial exercise prescription builds to 30-45 minutes a day of aerobic activity, 2-3 days per week.  Home exercise guidelines will be given to patient during program as part of exercise prescription that the participant will acknowledge.  Activity Barriers & Risk Stratification: Activity Barriers & Cardiac Risk Stratification - 10/29/17 1516      Activity Barriers & Cardiac Risk Stratification   Activity Barriers  Arthritis;Back Problems;Deconditioning;Muscular Weakness   Bilateral knee arthritis     Cardiac Risk Stratification  High       6 Minute Walk: 6 Minute Walk    Row Name 10/29/17 1514         6 Minute Walk   Phase  Initial     Distance  491.5 feet     Walk Time  4.15 minutes     # of Rest Breaks  2     MPH  0.93     METS  1.86     RPE  13     VO2 Peak  6.52     Symptoms  Yes (comment)  Comments  Fatigue     Resting HR  96 bpm     Resting BP  110/60     Resting Oxygen Saturation   100 %     Exercise Oxygen Saturation  during 6 min walk  97 %     Max Ex. HR  107 bpm     Max Ex. BP  118/70     2 Minute Post BP  122/72        Oxygen Initial Assessment:   Oxygen Re-Evaluation:   Oxygen Discharge (Final Oxygen Re-Evaluation):   Initial Exercise Prescription: Initial Exercise Prescription - 10/29/17 1500      Date of Initial Exercise RX and Referring Provider   Date  10/29/17    Referring Provider  Dr. Peter Martinique, MD     Expected Discharge Date  02/05/18      Recumbant Bike   Level  1    Watts  5    Minutes  10    METs  2.2      NuStep   Level  1    SPM  75    Minutes  10    METs  1.9      Arm Ergometer   Level  2    Watts  15    Minutes  10    METs  2.05      Prescription Details   Frequency (times per week)  3    Duration  Progress to 30 minutes of continuous aerobic without signs/symptoms of physical distress      Intensity   THRR 40-80% of Max Heartrate  63-126    Ratings of Perceived Exertion  11-13    Perceived Dyspnea  0-4      Progression   Progression  Continue progressive overload as per policy without signs/symptoms or physical distress.      Resistance Training   Training Prescription  Yes    Weight  2    Reps  10-15       Perform Capillary Blood Glucose checks as needed.  Exercise Prescription Changes:   Exercise Comments:   Exercise Goals and Review: Exercise Goals    Row Name 10/29/17 1518             Exercise Goals   Increase Physical Activity  Yes       Intervention  Provide advice,  education, support and counseling about physical activity/exercise needs.;Develop an individualized exercise prescription for aerobic and resistive training based on initial evaluation findings, risk stratification, comorbidities and participant's personal goals.       Expected Outcomes  Short Term: Attend rehab on a regular basis to increase amount of physical activity.       Increase Strength and Stamina  Yes       Intervention  Provide advice, education, support and counseling about physical activity/exercise needs.;Develop an individualized exercise prescription for aerobic and resistive training based on initial evaluation findings, risk stratification, comorbidities and participant's personal goals.       Expected Outcomes  Short Term: Increase workloads from initial exercise prescription for resistance, speed, and METs.;Short Term: Perform resistance training exercises routinely during rehab and add in resistance training at home;Long Term: Improve cardiorespiratory fitness, muscular endurance and strength as measured by increased METs and functional capacity (6MWT)       Able to understand and use rate of perceived exertion (RPE) scale  Yes       Intervention  Provide education and explanation on how  to use RPE scale       Expected Outcomes  Short Term: Able to use RPE daily in rehab to express subjective intensity level;Long Term:  Able to use RPE to guide intensity level when exercising independently       Knowledge and understanding of Target Heart Rate Range (THRR)  Yes       Intervention  Provide education and explanation of THRR including how the numbers were predicted and where they are located for reference       Expected Outcomes  Short Term: Able to state/look up THRR;Long Term: Able to use THRR to govern intensity when exercising independently;Short Term: Able to use daily as guideline for intensity in rehab       Able to check pulse independently  Yes       Intervention  Provide  education and demonstration on how to check pulse in carotid and radial arteries.;Review the importance of being able to check your own pulse for safety during independent exercise       Expected Outcomes  Short Term: Able to explain why pulse checking is important during independent exercise;Long Term: Able to check pulse independently and accurately       Understanding of Exercise Prescription  Yes       Intervention  Provide education, explanation, and written materials on patient's individual exercise prescription       Expected Outcomes  Short Term: Able to explain program exercise prescription;Long Term: Able to explain home exercise prescription to exercise independently          Exercise Goals Re-Evaluation :   Discharge Exercise Prescription (Final Exercise Prescription Changes):   Nutrition:  Target Goals: Understanding of nutrition guidelines, daily intake of sodium '1500mg'$ , cholesterol '200mg'$ , calories 30% from fat and 7% or less from saturated fats, daily to have 5 or more servings of fruits and vegetables.  Biometrics: Pre Biometrics - 10/29/17 1519      Pre Biometrics   Height  5' 4.5" (1.638 m)    Weight  73 kg    Waist Circumference  44 inches    Hip Circumference  45 inches    Waist to Hip Ratio  0.98 %    BMI (Calculated)  27.21    Triceps Skinfold  16 mm    % Body Fat  38.6 %    Grip Strength  30 kg    Flexibility  14 in    Single Leg Stand  0 seconds        Nutrition Therapy Plan and Nutrition Goals:   Nutrition Assessments:   Nutrition Goals Re-Evaluation:   Nutrition Goals Re-Evaluation:   Nutrition Goals Discharge (Final Nutrition Goals Re-Evaluation):   Psychosocial: Target Goals: Acknowledge presence or absence of significant depression and/or stress, maximize coping skills, provide positive support system. Participant is able to verbalize types and ability to use techniques and skills needed for reducing stress and depression.  Initial  Review & Psychosocial Screening: Initial Psych Review & Screening - 10/29/17 1620      Initial Review   Current issues with  None Identified      Family Dynamics   Good Support System?  Yes   Madalina has her grandchildren,sister and daughter for support     Barriers   Psychosocial barriers to participate in program  There are no identifiable barriers or psychosocial needs.      Screening Interventions   Interventions  Encouraged to exercise       Quality of Life  Scores: Quality of Life - 10/29/17 1458      Quality of Life   Select  Quality of Life      Quality of Life Scores   Health/Function Pre  23.21 %    Socioeconomic Pre  22.86 %    Psych/Spiritual Pre  22.86 %    Family Pre  27.88 %    GLOBAL Pre  23.64 %      Scores of 19 and below usually indicate a poorer quality of life in these areas.  A difference of  2-3 points is a clinically meaningful difference.  A difference of 2-3 points in the total score of the Quality of Life Index has been associated with significant improvement in overall quality of life, self-image, physical symptoms, and general health in studies assessing change in quality of life.  PHQ-9: Recent Review Flowsheet Data    Depression screen [facility] 2/9 09/04/2017 06/12/2017 06/01/2017 05/07/2017 07/31/2016   Decreased Interest 0 0 0 0 0   Down, Depressed, Hopeless 0 0 0 0 0   PHQ - 2 Score 0 0 0 0 0     Interpretation of Total Score  Total Score Depression Severity:  1-4 = Minimal depression, 5-9 = Mild depression, 10-14 = Moderate depression, 15-19 = Moderately severe depression, 20-27 = Severe depression   Psychosocial Evaluation and Intervention:   Psychosocial Re-Evaluation:   Psychosocial Discharge (Final Psychosocial Re-Evaluation):   Vocational Rehabilitation: Provide vocational rehab assistance to qualifying candidates.   Vocational Rehab Evaluation & Intervention: Vocational Rehab - 10/29/17 1623      Initial Vocational Rehab  Evaluation & Intervention   Assessment shows need for Vocational Rehabilitation  No       Education: Education Goals: Education classes will be provided on a weekly basis, covering required topics. Participant will state understanding/return demonstration of topics presented.  Learning Barriers/Preferences: Learning Barriers/Preferences - 10/29/17 1521      Learning Barriers/Preferences   Learning Barriers  None    Learning Preferences  Written Material       Education Topics: Count Your Pulse:  -Group instruction provided by verbal instruction, demonstration, patient participation and written materials to support subject.  Instructors address importance of being able to find your pulse and how to count your pulse when at home without a heart monitor.  Patients get hands on experience counting their pulse with staff help and individually.   Heart Attack, Angina, and Risk Factor Modification:  -Group instruction provided by verbal instruction, video, and written materials to support subject.  Instructors address signs and symptoms of angina and heart attacks.    Also discuss risk factors for heart disease and how to make changes to improve heart health risk factors.   Functional Fitness:  -Group instruction provided by verbal instruction, demonstration, patient participation, and written materials to support subject.  Instructors address safety measures for doing things around the house.  Discuss how to get up and down off the floor, how to pick things up properly, how to safely get out of a chair without assistance, and balance training.   Meditation and Mindfulness:  -Group instruction provided by verbal instruction, patient participation, and written materials to support subject.  Instructor addresses importance of mindfulness and meditation practice to help reduce stress and improve awareness.  Instructor also leads participants through a meditation exercise.    Stretching for  Flexibility and Mobility:  -Group instruction provided by verbal instruction, patient participation, and written materials to support subject.  Instructors lead participants  through series of stretches that are designed to increase flexibility thus improving mobility.  These stretches are additional exercise for major muscle groups that are typically performed during regular warm up and cool down.   Hands Only CPR:  -Group verbal, video, and participation provides a basic overview of AHA guidelines for community CPR. Role-play of emergencies allow participants the opportunity to practice calling for help and chest compression technique with discussion of AED use.   Hypertension: -Group verbal and written instruction that provides a basic overview of hypertension including the most recent diagnostic guidelines, risk factor reduction with self-care instructions and medication management.    Nutrition I class: Heart Healthy Eating:  -Group instruction provided by PowerPoint slides, verbal discussion, and written materials to support subject matter. The instructor gives an explanation and review of the Therapeutic Lifestyle Changes diet recommendations, which includes a discussion on lipid goals, dietary fat, sodium, fiber, plant stanol/sterol esters, sugar, and the components of a well-balanced, healthy diet.   Nutrition II class: Lifestyle Skills:  -Group instruction provided by PowerPoint slides, verbal discussion, and written materials to support subject matter. The instructor gives an explanation and review of label reading, grocery shopping for heart health, heart healthy recipe modifications, and ways to make healthier choices when eating out.   Diabetes Question & Answer:  -Group instruction provided by PowerPoint slides, verbal discussion, and written materials to support subject matter. The instructor gives an explanation and review of diabetes co-morbidities, pre- and post-prandial blood  glucose goals, pre-exercise blood glucose goals, signs, symptoms, and treatment of hypoglycemia and hyperglycemia, and foot care basics.   Diabetes Blitz:  -Group instruction provided by PowerPoint slides, verbal discussion, and written materials to support subject matter. The instructor gives an explanation and review of the physiology behind type 1 and type 2 diabetes, diabetes medications and rational behind using different medications, pre- and post-prandial blood glucose recommendations and Hemoglobin A1c goals, diabetes diet, and exercise including blood glucose guidelines for exercising safely.    Portion Distortion:  -Group instruction provided by PowerPoint slides, verbal discussion, written materials, and food models to support subject matter. The instructor gives an explanation of serving size versus portion size, changes in portions sizes over the last 20 years, and what consists of a serving from each food group.   Stress Management:  -Group instruction provided by verbal instruction, video, and written materials to support subject matter.  Instructors review role of stress in heart disease and how to cope with stress positively.     Exercising on Your Own:  -Group instruction provided by verbal instruction, power point, and written materials to support subject.  Instructors discuss benefits of exercise, components of exercise, frequency and intensity of exercise, and end points for exercise.  Also discuss use of nitroglycerin and activating EMS.  Review options of places to exercise outside of rehab.  Review guidelines for sex with heart disease.   Cardiac Drugs I:  -Group instruction provided by verbal instruction and written materials to support subject.  Instructor reviews cardiac drug classes: antiplatelets, anticoagulants, beta blockers, and statins.  Instructor discusses reasons, side effects, and lifestyle considerations for each drug class.   Cardiac Drugs II:  -Group  instruction provided by verbal instruction and written materials to support subject.  Instructor reviews cardiac drug classes: angiotensin converting enzyme inhibitors (ACE-I), angiotensin II receptor blockers (ARBs), nitrates, and calcium channel blockers.  Instructor discusses reasons, side effects, and lifestyle considerations for each drug class.   Anatomy and Physiology of the  Circulatory System:  Group verbal and written instruction and models provide basic cardiac anatomy and physiology, with the coronary electrical and arterial systems. Review of: AMI, Angina, Valve disease, Heart Failure, Peripheral Artery Disease, Cardiac Arrhythmia, Pacemakers, and the ICD.   Other Education:  -Group or individual verbal, written, or video instructions that support the educational goals of the cardiac rehab program.   Holiday Eating Survival Tips:  -Group instruction provided by PowerPoint slides, verbal discussion, and written materials to support subject matter. The instructor gives patients tips, tricks, and techniques to help them not only survive but enjoy the holidays despite the onslaught of food that accompanies the holidays.   Knowledge Questionnaire Score: Knowledge Questionnaire Score - 10/29/17 1524      Knowledge Questionnaire Score   Pre Score  21/24       Core Components/Risk Factors/Patient Goals at Admission: Personal Goals and Risk Factors at Admission - 10/29/17 1522      Core Components/Risk Factors/Patient Goals on Admission    Weight Management  Weight Loss;Yes    Intervention  Weight Management: Develop a combined nutrition and exercise program designed to reach desired caloric intake, while maintaining appropriate intake of nutrient and fiber, sodium and fats, and appropriate energy expenditure required for the weight goal.;Weight Management: Provide education and appropriate resources to help participant work on and attain dietary goals.;Weight Management/Obesity:  Establish reasonable short term and long term weight goals.    Admit Weight  160 lb 15 oz (73 kg)    Goal Weight: Long Term  140 lb (63.5 kg)    Expected Outcomes  Short Term: Continue to assess and modify interventions until short term weight is achieved;Long Term: Adherence to nutrition and physical activity/exercise program aimed toward attainment of established weight goal;Weight Loss: Understanding of general recommendations for a balanced deficit meal plan, which promotes 1-2 lb weight loss per week and includes a negative energy balance of [phone] kcal/d;Understanding recommendations for meals to include 15-35% energy as protein, 25-35% energy from fat, 35-60% energy from carbohydrates, less than '200mg'$  of dietary cholesterol, 20-35 gm of total fiber daily;Understanding of distribution of calorie intake throughout the day with the consumption of 4-5 meals/snacks    Diabetes  Yes    Intervention  Provide education about signs/symptoms and action to take for hypo/hyperglycemia.;Provide education about proper nutrition, including hydration, and aerobic/resistive exercise prescription along with prescribed medications to achieve blood glucose in normal ranges: Fasting glucose 65-99 mg/dL    Expected Outcomes  Short Term: Participant verbalizes understanding of the signs/symptoms and immediate care of hyper/hypoglycemia, proper foot care and importance of medication, aerobic/resistive exercise and nutrition plan for blood glucose control.;Long Term: Attainment of HbA1C < 7%.    Hypertension  Yes    Intervention  Provide education on lifestyle modifcations including regular physical activity/exercise, weight management, moderate sodium restriction and increased consumption of fresh fruit, vegetables, and low fat dairy, alcohol moderation, and smoking cessation.;Monitor prescription use compliance.    Expected Outcomes  Short Term: Continued assessment and intervention until BP is < 140/85m HG in  hypertensive participants. < 130/840mHG in hypertensive participants with diabetes, heart failure or chronic kidney disease.;Long Term: Maintenance of blood pressure at goal levels.    Lipids  Yes    Intervention  Provide education and support for participant on nutrition & aerobic/resistive exercise along with prescribed medications to achieve LDL '70mg'$ , HDL >'40mg'$ .    Expected Outcomes  Short Term: Participant states understanding of desired cholesterol values and is compliant with  medications prescribed. Participant is following exercise prescription and nutrition guidelines.;Long Term: Cholesterol controlled with medications as prescribed, with individualized exercise RX and with personalized nutrition plan. Value goals: LDL < 61m, HDL > 40 mg.       Core Components/Risk Factors/Patient Goals Review:    Core Components/Risk Factors/Patient Goals at Discharge (Final Review):    ITP Comments: ITP Comments    Row Name 10/29/17 1513           ITP Comments  Dr. TFransico Him Medical Director           Comments: Annison attended orientation from 1338 to 1440 to review rules and guidelines for program. Completed 6 minute walk test, Intitial ITP, and exercise prescription.  VSS. Telemetry-Sinus Rhythm. Patient is deconditioned and uses a rollator. Patient completed walk test in 4 minutes 15 seconds taking 2 rest breaks. Patient complained of fatigue. This resolved with rest. .MBarnet Pall RN,BSN 10/29/2017 4:29 PM

## 2017-10-30 ENCOUNTER — Ambulatory Visit (HOSPITAL_COMMUNITY): Payer: Medicaid Other

## 2017-11-02 ENCOUNTER — Encounter (HOSPITAL_COMMUNITY): Payer: Medicaid Other

## 2017-11-02 ENCOUNTER — Ambulatory Visit (HOSPITAL_COMMUNITY): Payer: Medicaid Other

## 2017-11-04 ENCOUNTER — Encounter (HOSPITAL_COMMUNITY): Payer: Medicaid Other

## 2017-11-04 ENCOUNTER — Ambulatory Visit (HOSPITAL_COMMUNITY): Payer: Medicaid Other

## 2017-11-04 ENCOUNTER — Telehealth: Payer: Self-pay | Admitting: Family Medicine

## 2017-11-04 ENCOUNTER — Encounter (HOSPITAL_COMMUNITY)
Admission: RE | Admit: 2017-11-04 | Discharge: 2017-11-04 | Disposition: A | Discharge status: Home / Self Care | Payer: Medicaid Other | Source: Ambulatory Visit | Attending: Cardiology | Admitting: Cardiology

## 2017-11-04 DIAGNOSIS — I252 Old myocardial infarction: Secondary | ICD-10-CM | POA: Diagnosis not present

## 2017-11-04 LAB — GLUCOSE, CAPILLARY: Glucose-Capillary: 348 mg/dL — ABNORMAL HIGH (ref 70–99)

## 2017-11-04 MED ORDER — GLUCOSE BLOOD VI STRP: ORAL_STRIP | 12 refills | Status: DC

## 2017-11-04 MED ORDER — INSULIN GLARGINE 100 UNIT/ML SOLOSTAR PEN: PEN_INJECTOR | SUBCUTANEOUS | 3 refills | Status: DC

## 2017-11-04 MED ORDER — ACCU-CHEK FASTCLIX LANCETS MISC: 1.0000 | Freq: Every day | 3 refills | Status: DC

## 2017-11-04 NOTE — Progress Notes (Signed)
Holly Acosta 62 y.o. female DOB 04/20/1955 MRN [id number]       Nutrition  1. NSTEMI (non-ST elevated myocardial infarction) ([place])   2. Status post coronary artery stent placement, DES CFX, 04/24/17     Past Medical History:  Diagnosis Date  . Acute cerebrovascular accident (CVA) of cerebellum ([place]) 04/29/2017  . Anemia   . Arthritis of knee    "both knees" (04/24/2017)  . Coronary artery disease involving native coronary artery of native heart with unstable angina pectoris ([place]) 04/30/2017  . Diabetes mellitus, type 2 ([place])   . History of blood transfusion    "related to prolapsed uterus"  . HLD (hyperlipidemia)   . HTN (hypertension)   . Non-ST elevation (NSTEMI) myocardial infarction ([place])   . Prolapsed uterus   . Sleep apnea    "have mask; haven't worn it for a long time" (04/24/2017)  . Status post coronary artery stent placement    Meds reviewed.    Current Outpatient Medications (Endocrine & Metabolic):  Marland Kitchen  Dulaglutide (TRULICITY) 8.33 AS/5.0NL SOPN, INJECT 0.75 MG INTO THE SKIN ONCE A WEEK. (Patient not taking: Reported on 10/26/2017) .  Exenatide ER 2 MG PEN, Inject 2 mg into the skin once a week. .  insulin aspart (NOVOLOG) 100 UNIT/ML injection, Inject 2-8 Units into the skin See admin instructions. Sliding scale: 201-250: 2 units; 251-300: 4 units; 301-350: 6 units; 351-400: 8 units. .  Insulin Glargine (LANTUS SOLOSTAR) 100 UNIT/ML Solostar Pen, INJECT 50 UNITS INTO THE SKIN IN THE MORNING (Patient taking differently: Inject 30 Units into the skin daily. INJECT 30 UNITS INTO THE SKIN IN THE MORNING) .  metFORMIN (GLUCOPHAGE) 1000 MG tablet, TAKE 1 TABLET BY MOUTH TWICE DAILY WITH A MEAL Resume on 04/30/17. Marland Kitchen  NOVOLOG FLEXPEN 100 UNIT/ML FlexPen, INJECT 20 UNITS INTO THE SKIN 3 TIMES DAILY WITH MEALS. (Patient taking differently: Inject subcutaneously as directed per sliding scale. Sliding scale: 201-250: 2 units; 251-300: 4 units; 301-350: 6 units; 351-400: 8 units.)  Current  Outpatient Medications (Cardiovascular):  .  amLODipine (NORVASC) 5 MG tablet, Take 1 tablet (5 mg total) by mouth daily. .  carvedilol (COREG) 12.5 MG tablet, Take 1 tablet (12.5 mg total) by mouth 2 (two) times daily with a meal. .  lisinopril (PRINIVIL,ZESTRIL) 20 MG tablet, Take 1 tablet (20 mg total) by mouth daily. .  rosuvastatin (CRESTOR) 40 MG tablet, Take 1 tablet (40 mg total) by mouth daily.   Current Outpatient Medications (Analgesics):  .  acetaminophen (TYLENOL) 500 MG tablet, Take 1,000 mg by mouth every 12 (twelve) hours as needed for mild pain.  Marland Kitchen  aspirin EC 81 MG tablet, Take 1 tablet (81 mg total) by mouth daily.  Current Outpatient Medications (Hematological):  .  ticagrelor (BRILINTA) 90 MG TABS tablet, Take 1 tablet (90 mg total) by mouth 2 (two) times daily.  Current Outpatient Medications (Other):  Marland Kitchen  ACCU-CHEK FASTCLIX LANCETS MISC, 1 each by Does not apply route daily. Check sugar daily as needed .  B-D UF III MINI PEN NEEDLES 31G X 5 MM MISC, USE AS DIRECTED 3 TIMES A DAY .  Blood Glucose Monitoring Suppl (ACCU-CHEK NANO SMARTVIEW) w/Device KIT, Use to test blood sugar up to 3 times a day. ICD-10 code: E11.9 .  glucose blood (ACCU-CHEK AVIVA) test strip, Use as instructed .  glucose blood (ACCU-CHEK SMARTVIEW) test strip, Use as instructed .  ondansetron (ZOFRAN-ODT) 4 MG disintegrating tablet, Take 4 mg by mouth daily as needed  for nausea or vomiting.   HT: Ht Readings from Last 1 Encounters:  10/29/17 5' 4.5" (1.638 m)    WT: Wt Readings from Last 5 Encounters:  10/29/17 160 lb 15 oz (73 kg)  10/06/17 174 lb 7.2 oz (79.1 kg)  09/04/17 169 lb 12.8 oz (77 kg)  06/12/17 167 lb (75.8 kg)  06/08/17 167 lb 9.6 oz (76 kg)     Body mass index is 27.2 kg/m.   Current tobacco use? No       Labs:  Lipid Panel     Component Value Date/Time   CHOL 173 04/29/2017 0557   TRIG 159 (H) 04/29/2017 0557   HDL 34 (L) 04/29/2017 0557   CHOLHDL 5.1 04/29/2017  0557   VLDL 32 04/29/2017 0557   LDLCALC 107 (H) 04/29/2017 0557   LDLDIRECT 135 (H) 12/31/2012 0949    Lab Results  Component Value Date   HGBA1C 9.3 (A) 09/04/2017   CBG (last 3)  No results for input(s): GLUCAP in the last 72 hours.  Nutrition Diagnosis ? Food-and nutrition-related knowledge deficit related to lack of exposure to information as related to diagnosis of: ? CVD ? Type 2 Diabetes ? Overweight  related to excessive energy intake as evidenced by a Body mass index is 27.2 kg/m.  Nutrition Goal(s):  ? To be determined  Plan:  Pt to attend nutrition classes ? Nutrition I ? Nutrition II ? Portion Distortion  ? Diabetes Blitz ? Diabetes Q & A Will provide client-centered nutrition education as part of interdisciplinary care.   Monitor and evaluate progress toward nutrition goal with team.  Laurina Bustle, MS, RD, LDN 11/04/2017 9:24 AM

## 2017-11-04 NOTE — Telephone Encounter (Signed)
Received call from nursing staff at cardiac rehab the patient's blood glucoses rather elevated this afternoon.  The patient reports although she has all of her medications she has not been checking her blood sugars.  As such she has not been administering any of her short acting NovoLog.  The patient reports she is taking 30 units of Lantus daily.  Her morning blood sugars have been greater than 150.  Increase morning Lantus to 32 units.  The patient reports she has not had any lancets or test strips in several days to check her blood sugars.  Recommended she obtain these before administering insulin.  Refills for all of these supplies were sent to her preferred pharmacy.  Reviewed reasons to return to care and call relayed message to nursing staff at cardiac rehab.  The patient is follow-up with Dr. Ouida Sills next week.

## 2017-11-04 NOTE — Progress Notes (Addendum)
Incomplete Session Note  Patient Details  Name: BETUL BRISKY MRN: [id number] Date of Birth: [birth year] Referring Provider:     CARDIAC REHAB PHASE II ORIENTATION from 10/29/2017 in [facility]  Referring Provider  Dr. Peter Martinique, MD       [name] did not complete her rehab session.  Annetta's CBG was 348 this afternoon. Gidget says she took her medications as prescribed except the exenatide ER once a week. Ethie says it is too difficult to take. Will have the dietitian talk with the patient. Dr Jarrett Soho Anderson's office called and notified about today's elevated CBG. I spoke with Dr Owens Shark who spoke with the patient over the phone with instructions. I advised Ms Sanjose to check her CBG at home prior to coming to exercise if her CBG is greater than 300. Patient states understanding.Barnet Pall, RN,BSN 11/05/2017 9:05 AM

## 2017-11-05 ENCOUNTER — Telehealth (HOSPITAL_COMMUNITY): Payer: Self-pay | Admitting: *Deleted

## 2017-11-06 ENCOUNTER — Encounter (HOSPITAL_COMMUNITY): Payer: Medicaid Other

## 2017-11-06 ENCOUNTER — Ambulatory Visit (HOSPITAL_COMMUNITY): Payer: Medicaid Other

## 2017-11-09 ENCOUNTER — Ambulatory Visit (HOSPITAL_COMMUNITY): Payer: Medicaid Other

## 2017-11-09 ENCOUNTER — Encounter (HOSPITAL_COMMUNITY): Payer: Medicaid Other

## 2017-11-09 ENCOUNTER — Encounter (HOSPITAL_COMMUNITY)
Admission: RE | Admit: 2017-11-09 | Discharge: 2017-11-09 | Disposition: A | Discharge status: Home / Self Care | Payer: Medicaid Other | Source: Ambulatory Visit | Attending: Cardiology | Admitting: Cardiology

## 2017-11-09 DIAGNOSIS — Z955 Presence of coronary angioplasty implant and graft: Secondary | ICD-10-CM

## 2017-11-09 DIAGNOSIS — I252 Old myocardial infarction: Secondary | ICD-10-CM | POA: Diagnosis not present

## 2017-11-09 DIAGNOSIS — I214 Non-ST elevation (NSTEMI) myocardial infarction: Secondary | ICD-10-CM

## 2017-11-09 LAB — GLUCOSE, CAPILLARY
Glucose-Capillary: 160 mg/dL — ABNORMAL HIGH (ref 70–99)
Glucose-Capillary: 140 mg/dL — ABNORMAL HIGH (ref 70–99)

## 2017-11-09 NOTE — Progress Notes (Signed)
Daily Session Note  Patient Details  Name: Holly Acosta MRN: [id number] Date of Birth: Jun 18, 1955 Referring Provider:     CARDIAC REHAB PHASE II ORIENTATION from 10/29/2017 in [facility]  Referring Provider  Dr. Peter Martinique, MD       Encounter Date: 11/09/2017  Check In: Session Check In - 11/09/17 1502      Check-In   Supervising physician immediately available to respond to emergencies  [facility] immediately available    Physician(s)  Dr. Thomasene Ripple    Location  [facility]    Staff Present  Deitra Mayo, BS, ACSM CEP, Exercise Physiologist;Olinty Celesta Aver, MS, ACSM CEP, Exercise Physiologist;Tyara Carol Ada, MS,ACSM CEP, Exercise Physiologist;Maria Whitaker, RN, BSN    Medication changes reported      No    Fall or balance concerns reported     No    Tobacco Cessation  No Change    Warm-up and Cool-down  Performed as group-led instruction    Resistance Training Performed  Yes    VAD Patient?  No    PAD/SET Patient?  No      Pain Assessment   Currently in Pain?  No/denies    Multiple Pain Sites  No       Capillary Blood Glucose: Results for orders placed or performed during the hospital encounter of 11/09/17 (from the past 24 hour(s))  Glucose, capillary     Status: Abnormal   Collection Time: 11/09/17  3:43 PM  Result Value Ref Range   Glucose-Capillary 140 (H) 70 - 99 mg/dL      Social History   Tobacco Use  Smoking Status Former Smoker  . Packs/day: 1.00  . Years: 33.00  . Pack years: 33.00  . Types: Cigarettes  . Start date: 01/20/1973  . Last attempt to quit: 11/04/2006  . Years since quitting: 11.0  Smokeless Tobacco Never Used    Goals Met:  No report of cardiac concerns or symptoms  Goals Unmet:  Not Applicable  Comments: Holly Acosta started cardiac rehab today.  Pt tolerated light exercise without difficulty. VSS, telemetry-Sinus Rhythm, asymptomatic.  Medication list reconciled. Pt denies barriers  to medicaiton compliance.  PSYCHOSOCIAL ASSESSMENT:  PHQ-0. Pt exhibits positive coping skills, hopeful outlook with supportive family. No psychosocial needs identified at this time, no psychosocial interventions necessary.    Pt enjoys watching TV and folding clothes.   Pt oriented to exercise equipment and routine.    Understanding verbalized. CBG's improved today. Holly Acosta uses a rollator for stability.Barnet Pall, RN,BSN 11/09/2017 4:27 PM   Dr. Fransico Him is Medical Director for Cardiac Rehab at [facility]

## 2017-11-10 ENCOUNTER — Other Ambulatory Visit: Payer: Self-pay

## 2017-11-10 ENCOUNTER — Encounter: Payer: Self-pay | Admitting: Family Medicine

## 2017-11-10 ENCOUNTER — Ambulatory Visit (INDEPENDENT_AMBULATORY_CARE_PROVIDER_SITE_OTHER): Payer: Medicaid Other | Admitting: Family Medicine

## 2017-11-10 VITALS — BP 124/78 | HR 65 | Temp 99.0°F | Ht 65.0 in | Wt 170.4 lb

## 2017-11-10 DIAGNOSIS — Z23 Encounter for immunization: Secondary | ICD-10-CM | POA: Diagnosis not present

## 2017-11-10 DIAGNOSIS — E1165 Type 2 diabetes mellitus with hyperglycemia: Secondary | ICD-10-CM | POA: Diagnosis not present

## 2017-11-10 LAB — GLUCOSE, POCT (MANUAL RESULT ENTRY): POC Glucose: 192 mg/dl — AB (ref 70–99)

## 2017-11-10 NOTE — Assessment & Plan Note (Signed)
1. Patient instructed to fill out log book 2. Using YouTube and granddaughter's help, learn to use and administer the Exenatide ER pen 2mg  once weekly. If this is difficult, return to clinic for help with a nurse or pharmacist. 3. Follow up in about 4 weeks for pap smear, to check A1c, and to review the log book.

## 2017-11-10 NOTE — Progress Notes (Signed)
   Subjective:    Patient ID: Holly Acosta, female    DOB: Jul 13, 1955, 62 y.o.   MRN: [id number]   CC: Blood sugar checks, flu shot  HPI: patient is presenting for follow up with blood sugar maintenance  T2DM: Patient is currently prescribed exenatide ER 2mg  pen, Lantus 32 units daily, and Novolog 8-11 units sliding scale. Checking blood sugar twice daily, not keeping a log book but plans to start that. Was recently switched from Trulicity to Exenatide due to insurance issues but now having issues working the exenatide.  She reports blood sugars as high as 200 in the morning.  Last HbA1c 9.3% on 09/04/2017.  Due for a pap smear: pessary in place due to prolapsed uterus, has had in for many years.  The patient is able to take this out, clean it, and reinsert in the proper location.  She states that she is not prepared for a Pap smear at this appointment.  Smoking status reviewed: Non-smoker  Review of Systems  Constitutional: Negative for chills, fever and weight loss.  Eyes: Positive for blurred vision.  Respiratory: Negative for shortness of breath.   Cardiovascular: Negative for chest pain, claudication and leg swelling.  Gastrointestinal: Negative for abdominal pain.  Genitourinary: Negative for dysuria and frequency.   Objective:  BP 124/78   Pulse 65   Temp 99 F (37.2 C) (Oral)   Ht 5\' 5"  (1.651 m)   Wt 170 lb 6.4 oz (77.3 kg)   SpO2 98%   BMI 28.36 kg/m   Physical Exam  Constitutional: She appears well-developed and well-nourished. No distress.  Eyes: EOM are normal.  Neck: Normal range of motion. No thyromegaly present.  Cardiovascular: Normal rate, regular rhythm, normal heart sounds and intact distal pulses.  Pulmonary/Chest: Effort normal and breath sounds normal.  Abdominal: Soft. A hernia (Large ventral/umbilical) is present.  Musculoskeletal: Normal range of motion. She exhibits no edema.  Neurological: She is alert. No cranial nerve deficit.  Skin: Skin is  warm. Capillary refill takes less than 2 seconds.  Psychiatric: She has a normal mood and affect.   Assessment & Plan:   DM (diabetes mellitus), type 2, uncontrolled ([place]) 1. Patient instructed to fill out log book 2. Using YouTube and granddaughter's help, learn to use and administer the Exenatide ER pen 2mg  once weekly. If this is difficult, return to clinic for help with a nurse or pharmacist. 3. Follow up in about 4 weeks for pap smear, to check A1c, and to review the log book.   Return in about 4 weeks (around 12/08/2017) for Within 4 weeks for pap smear, check A1c, check sugar log book .   Dr. Milus Banister [facility], PGY-1

## 2017-11-10 NOTE — Patient Instructions (Addendum)
Thank you for coming in to see Holly Acosta today! Please see below to review our plan for today's visit:  1. Fill out log book! 2. Using YouTube and granddaughter's help, learn to use and administer the Exenatide ER pen 2mg  once weekly. If this is difficult, return to clinic for help with a nurse or pharmacist. 3. Follow up in about 4 weeks for pap smear, to check A1c, and to review the log book. 4. You received a flu shot today!  Please call the clinic at [phone] if your symptoms worsen or you have any concerns. It was our pleasure to serve you!  Dr. Milus Banister [facility]

## 2017-11-10 NOTE — Assessment & Plan Note (Signed)
>>  ASSESSMENT AND PLAN FOR UNCONTROLLED TYPE 2 DIABETES MELLITUS WITH HYPERGLYCEMIA ([facility]) WRITTEN ON 11/10/2017  2:36 PM BY Dareen Piano, HANNAH C, DO  1. Patient instructed to fill out log book 2. Using YouTube and granddaughter's help, learn to use and administer the Exenatide ER pen 2mg  once weekly. If this is difficult, return to clinic for help with a nurse or pharmacist. 3. Follow up in about 4 weeks for pap smear, to check A1c, and to review the log book.

## 2017-11-11 ENCOUNTER — Encounter (HOSPITAL_COMMUNITY): Payer: Medicaid Other

## 2017-11-11 ENCOUNTER — Ambulatory Visit (HOSPITAL_COMMUNITY): Payer: Medicaid Other

## 2017-11-11 ENCOUNTER — Telehealth (HOSPITAL_COMMUNITY): Payer: Self-pay | Admitting: Family Medicine

## 2017-11-13 ENCOUNTER — Encounter (HOSPITAL_COMMUNITY): Payer: Medicaid Other

## 2017-11-13 ENCOUNTER — Encounter (HOSPITAL_COMMUNITY)
Admission: RE | Admit: 2017-11-13 | Discharge: 2017-11-13 | Disposition: A | Discharge status: Home / Self Care | Payer: Medicaid Other | Source: Ambulatory Visit | Attending: Cardiology | Admitting: Cardiology

## 2017-11-13 ENCOUNTER — Ambulatory Visit (HOSPITAL_COMMUNITY): Payer: Medicaid Other

## 2017-11-13 DIAGNOSIS — Z955 Presence of coronary angioplasty implant and graft: Secondary | ICD-10-CM

## 2017-11-13 DIAGNOSIS — I214 Non-ST elevation (NSTEMI) myocardial infarction: Secondary | ICD-10-CM

## 2017-11-13 DIAGNOSIS — I252 Old myocardial infarction: Secondary | ICD-10-CM | POA: Diagnosis not present

## 2017-11-14 NOTE — Progress Notes (Signed)
Cardiology Office Note   Date:  11/24/2017   ID:  Emilija, Bohman 1955/04/21, MRN [id number]  PCP:  Daisy Floro, DO   Cardiologist: Dr. Martinique  Chief Complaint  Patient presents with  . Coronary Artery Disease     History of Present Illness: Holly Acosta is a 62 y.o. female who presents for ongoing assessment and management of coronary artery disease. She has a history of DM, HTN, HLD, and OSA. Cardiac cath in 2008 showed no significant CAD. She was admitted with a NSTEMI in April 2019.  Troponin > 65. Cardiac catheterization showed severe stenosis of the left circumflex requiring drug-eluting stent, currently on dual antiplatelet therapy with Brilinta and aspirin.  Her hospital course was complication by an acute ischemic left cerebral infarct without frank hemorrhagic transformation and acute ischemic nonhemorrhagic right cerebellar infarct. The patient was transferred to skilled nursing facility for rehab.    She was seen in the ER on 05/14/2017, It was suspected that constipation was causing symptoms. She was found to have a reducible ventral hernia without bowel incarceration. She was told to take Miralax daily.   She was seen in May by Jory Sims NP. She was referred to Cardiac Rehab.  On follow up today she reports she is doing well. Denies any chest pain or SOB. No palpitations. No TIA or CVA symptoms. Walks with a walker. Last A1c was 9.3. Hasn't had lipids rechecked since hospitalization in April. Overall feels well.   Past Medical History:  Diagnosis Date  . Acute cerebrovascular accident (CVA) of cerebellum ([place]) 04/29/2017  . Anemia   . Arthritis of knee    "both knees" (04/24/2017)  . Coronary artery disease involving native coronary artery of native heart with unstable angina pectoris ([place]) 04/30/2017  . Diabetes mellitus, type 2 ([place])   . History of blood transfusion    "related to prolapsed uterus"  . HLD (hyperlipidemia)   . HTN (hypertension)   . Non-ST  elevation (NSTEMI) myocardial infarction ([place])   . Prolapsed uterus   . Sleep apnea    "have mask; haven't worn it for a long time" (04/24/2017)  . Status post coronary artery stent placement     Past Surgical History:  Procedure Laterality Date  . CARDIAC CATHETERIZATION  2011  . CORONARY STENT INTERVENTION N/A 04/27/2017   Procedure: CORONARY STENT INTERVENTION;  Surgeon: Jettie Booze, MD;  Location: [facility];  Service: Cardiovascular;  Laterality: N/A;  . LEFT HEART CATH AND CORONARY ANGIOGRAPHY N/A 04/27/2017   Procedure: LEFT HEART CATH AND CORONARY ANGIOGRAPHY;  Surgeon: Jettie Booze, MD;  Location: [facility];  Service: Cardiovascular;  Laterality: N/A;  . TUBAL LIGATION       Current Outpatient Medications  Medication Sig Dispense Refill  . ACCU-CHEK FASTCLIX LANCETS MISC 1 each by Does not apply route daily. Check sugar daily as needed 102 each 3  . acetaminophen (TYLENOL) 500 MG tablet Take 1,000 mg by mouth every 12 (twelve) hours as needed for mild pain.     Marland Kitchen amLODipine (NORVASC) 5 MG tablet Take 1 tablet (5 mg total) by mouth daily. 90 tablet 3  . aspirin EC 81 MG tablet Take 1 tablet (81 mg total) by mouth daily. 90 tablet 3  . B-D UF III MINI PEN NEEDLES 31G X 5 MM MISC USE AS DIRECTED 3 TIMES A DAY 100 each 1  . Blood Glucose Monitoring Suppl (ACCU-CHEK NANO SMARTVIEW) w/Device KIT Use to test  blood sugar up to 3 times a day. ICD-10 code: E11.9 1 kit 0  . carvedilol (COREG) 12.5 MG tablet Take 1 tablet (12.5 mg total) by mouth 2 (two) times daily with a meal. 180 tablet 3  . Exenatide ER 2 MG PEN Inject 2 mg into the skin once a week. 4 each 0  . glucose blood (ACCU-CHEK AVIVA) test strip Use as instructed 100 each 12  . glucose blood (ACCU-CHEK SMARTVIEW) test strip Use as instructed 100 each 12  . insulin aspart (NOVOLOG) 100 UNIT/ML injection Inject 2-8 Units into the skin See admin instructions. Sliding scale: 201-250: 2 units; 251-300: 4  units; 301-350: 6 units; 351-400: 8 units.    . Insulin Glargine (LANTUS SOLOSTAR) 100 UNIT/ML Solostar Pen INJECT 32 units into skin dialy 45 pen 3  . lisinopril (PRINIVIL,ZESTRIL) 20 MG tablet Take 1 tablet (20 mg total) by mouth daily. 90 tablet 3  . metFORMIN (GLUCOPHAGE) 1000 MG tablet TAKE 1 TABLET BY MOUTH TWICE DAILY WITH A MEAL Resume on 04/30/17. 60 tablet 2  . NOVOLOG FLEXPEN 100 UNIT/ML FlexPen INJECT 20 UNITS INTO THE SKIN 3 TIMES DAILY WITH MEALS. (Patient taking differently: Inject subcutaneously as directed per sliding scale. Sliding scale: 201-250: 2 units; 251-300: 4 units; 301-350: 6 units; 351-400: 8 units.) 15 pen 2  . rosuvastatin (CRESTOR) 40 MG tablet Take 1 tablet (40 mg total) by mouth daily. 90 tablet 3  . ticagrelor (BRILINTA) 90 MG TABS tablet Take 1 tablet (90 mg total) by mouth 2 (two) times daily. 180 tablet 3   No current facility-administered medications for this visit.     Allergies:   Lipitor [atorvastatin] and Diclofenac    Social History:  The patient  reports that she quit smoking about 11 years ago. Her smoking use included cigarettes. She started smoking about 44 years ago. She has a 33.00 pack-year smoking history. She has never used smokeless tobacco. She reports that she does not drink alcohol or use drugs.   Family History:  The patient's family history includes Diabetes in her mother; Hypertension in her father and mother.    ROS: All other systems are reviewed and negative. Unless otherwise mentioned in H&P    PHYSICAL EXAM: VS:  BP (!) 141/91   Pulse 79   Ht 5' 5.5" (1.664 m)   Wt 171 lb 3.2 oz (77.7 kg)   BMI 28.06 kg/m  , BMI Body mass index is 28.06 kg/m. GEN: Well nourished, well developed, in no acute distress  HEENT: normal  Neck: no JVD, carotid bruits, or masses Cardiac: RRR; no murmurs, rubs, or gallops,no edema  Respiratory:  clear to auscultation bilaterally, normal work of breathing GI: soft, nontender, nondistended, +  BS MS: no deformity or atrophy  Skin: warm and dry, no rash Neuro:  Strength and sensation are intact Psych: euthymic mood, full affect   EKG:  Not completed during office visit   Recent Labs: 04/24/2017: B Natriuretic Peptide 31.0 04/28/2017: TSH 0.422 05/14/2017: ALT 26; BUN 10; Creatinine, Ser 1.03; Hemoglobin 14.1; Platelets 267; Potassium 3.5; Sodium 135    Lipid Panel    Component Value Date/Time   CHOL 173 04/29/2017 0557   TRIG 159 (H) 04/29/2017 0557   HDL 34 (L) 04/29/2017 0557   CHOLHDL 5.1 04/29/2017 0557   VLDL 32 04/29/2017 0557   LDLCALC 107 (H) 04/29/2017 0557   LDLDIRECT 135 (H) 12/31/2012 0949      Wt Readings from Last 3 Encounters:  11/24/17 171  lb 3.2 oz (77.7 kg)  11/10/17 170 lb 6.4 oz (77.3 kg)  10/29/17 160 lb 15 oz (73 kg)      Other studies Reviewed:  Cardiac Cath 04/27/2017     Prox RCA lesion is 25% stenosed.  Mid RCA to Dist RCA lesion is 25% stenosed.  Prox LAD lesion is 25% stenosed.  The left ventricular systolic function is normal.  LV end diastolic pressure is mildly elevated. LVEDP 18 mm Hg.  The left ventricular ejection fraction is 55-65% by visual estimate.  There is no aortic valve stenosis.  Prox Cx lesion is 99% stenosed. This was the culprit lesion.  A drug-eluting stent was successfully placed using a STENT SIERRA 3.50 X 18 MM.  Post intervention, there is a 0% residual stenosis.   Continue dual antiplatelet therapy for at least one year with aggressive secondary prevention.       Echocardiogram 04/29/2017  Left ventricle: The cavity size was normal. There was moderate   concentric hypertrophy. Systolic function was normal. The   estimated ejection fraction was in the range of 55% to 60%.   Doppler parameters are consistent with abnormal left ventricular   relaxation (grade 1 diastolic dysfunction). There was no evidence   of elevated ventricular filling pressure by Doppler parameters. - Mitral valve: There  was no regurgitation. Valve area by pressure   half-time: 2.08 cm^2. - Left atrium: The atrium was normal in size. - Right ventricle: Systolic function was normal. - Right atrium: The atrium was normal in size. - Tricuspid valve: There was no regurgitation. - Pericardium, extracardiac: The pericardium was normal in   appearance.  Impressions:  - There is hypokinesis of the basal inferior, inferolateral and mid   inferolateral walls, overall LVEF is preserved at 55-60%.   ASSESSMENT AND PLAN:  1.  CAD: S/P NSTEMI - cath requiring severe stenosis of the left circumflex requiring drug-eluting stent on 04/27/17, She remains on Brilinta and ASA for one year. Continue cardiac Rehab. Continue Coreg and ACEi.   2. CVA: Bilateral cerebral non-hemmorhagic infarcts- embolic following cardiac cath/PCI. She has completed inpatient rehab and continues to improve ambulation with walker at home.  3. Hypercholesterolemia: Continues on Crestor. Needs follow up lab work. She is due for A1c with primary care next month. Will have them add a CMET and lipid panel.   4. Diabetes: Significant weight loss since having cardiac and cerebral events.  Followed by PCP. Last A1c 9.3%  5. OSA. History of noncompliance with CPAP. Patient reports she is scheduled for sleep study next month.    Peter Martinique MD, [facility]     11/24/2017 1:34 PM    [facility]

## 2017-11-16 ENCOUNTER — Ambulatory Visit (HOSPITAL_COMMUNITY): Payer: Medicaid Other

## 2017-11-16 ENCOUNTER — Encounter (HOSPITAL_COMMUNITY)
Admission: RE | Admit: 2017-11-16 | Discharge: 2017-11-16 | Disposition: A | Discharge status: Home / Self Care | Payer: Medicaid Other | Source: Ambulatory Visit | Attending: Cardiology | Admitting: Cardiology

## 2017-11-16 ENCOUNTER — Encounter (HOSPITAL_COMMUNITY): Payer: Medicaid Other

## 2017-11-16 DIAGNOSIS — I214 Non-ST elevation (NSTEMI) myocardial infarction: Secondary | ICD-10-CM

## 2017-11-16 DIAGNOSIS — I252 Old myocardial infarction: Secondary | ICD-10-CM | POA: Diagnosis not present

## 2017-11-16 DIAGNOSIS — Z955 Presence of coronary angioplasty implant and graft: Secondary | ICD-10-CM

## 2017-11-18 ENCOUNTER — Encounter (HOSPITAL_COMMUNITY)
Admission: RE | Admit: 2017-11-18 | Discharge: 2017-11-18 | Disposition: A | Discharge status: Home / Self Care | Payer: Medicaid Other | Source: Ambulatory Visit | Attending: Cardiology | Admitting: Cardiology

## 2017-11-18 ENCOUNTER — Encounter (HOSPITAL_COMMUNITY): Payer: Medicaid Other

## 2017-11-18 ENCOUNTER — Ambulatory Visit (HOSPITAL_COMMUNITY): Payer: Medicaid Other

## 2017-11-18 DIAGNOSIS — I214 Non-ST elevation (NSTEMI) myocardial infarction: Secondary | ICD-10-CM

## 2017-11-18 DIAGNOSIS — Z955 Presence of coronary angioplasty implant and graft: Secondary | ICD-10-CM

## 2017-11-18 DIAGNOSIS — I252 Old myocardial infarction: Secondary | ICD-10-CM | POA: Diagnosis not present

## 2017-11-18 NOTE — Progress Notes (Signed)
Reviewed home exercise guidelines with patient including endpoints, temperature precautions, target heart rate and rate of perceived exertion. Pt plans to walk as her mode of home exercise. Pt will accumulate 30 minutes walking: 10 minutes 3x's/day or 15 minutes 2x's/day, taking rest breaks as needed starting with 1 day/week and adding additional days as able to build a walking routine. Pt voices understanding of instructions given.

## 2017-11-19 NOTE — Progress Notes (Signed)
Cardiac Individual Treatment Plan  Patient Details  Name: Holly Acosta MRN: [id number] Date of Birth: 1955/10/22 Referring Provider:     CARDIAC REHAB PHASE II ORIENTATION from 10/29/2017 in [place]  Referring Provider  Dr. Peter Martinique, MD       Initial Encounter Date:    CARDIAC REHAB PHASE II ORIENTATION from 10/29/2017 in [place]  Date  10/29/17      Visit Diagnosis: NSTEMI (non-ST elevated myocardial infarction) [facility])  Status post coronary artery stent placement, DES CFX, 04/24/17   Patient's Home Medications on Admission:  Current Outpatient Medications:  .  ACCU-CHEK FASTCLIX LANCETS MISC, 1 each by Does not apply route daily. Check sugar daily as needed, Disp: 102 each, Rfl: 3 .  acetaminophen (TYLENOL) 500 MG tablet, Take 1,000 mg by mouth every 12 (twelve) hours as needed for mild pain. , Disp: , Rfl:  .  amLODipine (NORVASC) 5 MG tablet, Take 1 tablet (5 mg total) by mouth daily., Disp: 90 tablet, Rfl: 3 .  aspirin EC 81 MG tablet, Take 1 tablet (81 mg total) by mouth daily., Disp: 90 tablet, Rfl: 3 .  B-D UF III MINI PEN NEEDLES 31G X 5 MM MISC, USE AS DIRECTED 3 TIMES A DAY, Disp: 100 each, Rfl: 1 .  Blood Glucose Monitoring Suppl (ACCU-CHEK NANO SMARTVIEW) w/Device KIT, Use to test blood sugar up to 3 times a day. ICD-10 code: E11.9, Disp: 1 kit, Rfl: 0 .  carvedilol (COREG) 12.5 MG tablet, Take 1 tablet (12.5 mg total) by mouth 2 (two) times daily with a meal., Disp: 180 tablet, Rfl: 3 .  Exenatide ER 2 MG PEN, Inject 2 mg into the skin once a week., Disp: 4 each, Rfl: 0 .  glucose blood (ACCU-CHEK AVIVA) test strip, Use as instructed, Disp: 100 each, Rfl: 12 .  glucose blood (ACCU-CHEK SMARTVIEW) test strip, Use as instructed, Disp: 100 each, Rfl: 12 .  insulin aspart (NOVOLOG) 100 UNIT/ML injection, Inject 2-8 Units into the skin See admin instructions. Sliding scale: 201-250: 2 units; 251-300: 4  units; 301-350: 6 units; 351-400: 8 units., Disp: , Rfl:  .  Insulin Glargine (LANTUS SOLOSTAR) 100 UNIT/ML Solostar Pen, INJECT 32 units into skin dialy, Disp: 45 pen, Rfl: 3 .  lisinopril (PRINIVIL,ZESTRIL) 20 MG tablet, Take 1 tablet (20 mg total) by mouth daily., Disp: 90 tablet, Rfl: 3 .  metFORMIN (GLUCOPHAGE) 1000 MG tablet, TAKE 1 TABLET BY MOUTH TWICE DAILY WITH A MEAL Resume on 04/30/17., Disp: 60 tablet, Rfl: 2 .  NOVOLOG FLEXPEN 100 UNIT/ML FlexPen, INJECT 20 UNITS INTO THE SKIN 3 TIMES DAILY WITH MEALS. (Patient taking differently: Inject subcutaneously as directed per sliding scale. Sliding scale: 201-250: 2 units; 251-300: 4 units; 301-350: 6 units; 351-400: 8 units.), Disp: 15 pen, Rfl: 2 .  ondansetron (ZOFRAN-ODT) 4 MG disintegrating tablet, Take 4 mg by mouth daily as needed for nausea or vomiting., Disp: , Rfl:  .  rosuvastatin (CRESTOR) 40 MG tablet, Take 1 tablet (40 mg total) by mouth daily., Disp: 90 tablet, Rfl: 3 .  ticagrelor (BRILINTA) 90 MG TABS tablet, Take 1 tablet (90 mg total) by mouth 2 (two) times daily., Disp: 180 tablet, Rfl: 3  Past Medical History: Past Medical History:  Diagnosis Date  . Acute cerebrovascular accident (CVA) of cerebellum ([place]) 04/29/2017  . Anemia   . Arthritis of knee    "both knees" (04/24/2017)  . Coronary artery disease involving native  coronary artery of native heart with unstable angina pectoris ([place]) 04/30/2017  . Diabetes mellitus, type 2 ([place])   . History of blood transfusion    "related to prolapsed uterus"  . HLD (hyperlipidemia)   . HTN (hypertension)   . Non-ST elevation (NSTEMI) myocardial infarction ([place])   . Prolapsed uterus   . Sleep apnea    "have mask; haven't worn it for a long time" (04/24/2017)  . Status post coronary artery stent placement     Tobacco Use: Social History   Tobacco Use  Smoking Status Former Smoker  . Packs/day: 1.00  . Years: 33.00  . Pack years: 33.00  . Types: Cigarettes  . Start date:  01/20/1973  . Last attempt to quit: 11/04/2006  . Years since quitting: 11.0  Smokeless Tobacco Never Used    Labs: Recent Review Flowsheet Data    Labs for ITP Cardiac and Pulmonary Rehab Latest Ref Rng & Units 07/31/2016 04/25/2017 04/28/2017 04/29/2017 09/04/2017   Cholestrol 0 - 200 mg/dL - 230(H) - 173 -   LDLCALC 0 - 99 mg/dL - UNABLE TO CALCULATE IF TRIGLYCERIDE OVER 400 mg/dL - 107(H) -   LDLDIRECT mg/dL - - - - -   HDL >40 mg/dL - 30(L) - 34(L) -   Trlycerides <150 mg/dL - 518(H) - 159(H) -   Hemoglobin A1c 0.0 - 7.0 % 13.7 - - 13.3(H) 9.3(A)   PHART 7.350 - 7.450 - - 7.477(H) - -   PCO2ART 32.0 - 48.0 mmHg - - 34.9 - -   HCO3 20.0 - 28.0 mmol/L - - 25.7 - -   O2SAT % - - 89.2 - -      Capillary Blood Glucose: Lab Results  Component Value Date   GLUCAP 140 (H) 11/09/2017   GLUCAP 160 (H) 11/09/2017   GLUCAP 348 (H) 11/04/2017   GLUCAP 97 05/14/2017   GLUCAP 175 (H) 05/04/2017     Exercise Target Goals: Exercise Program Goal: Individual exercise prescription set using results from initial 6 min walk test and THRR while considering  patient's activity barriers and safety.   Exercise Prescription Goal: Starting with aerobic activity 30 plus minutes a day, 3 days per week for initial exercise prescription. Provide home exercise prescription and guidelines that participant acknowledges understanding prior to discharge.  Activity Barriers & Risk Stratification: Activity Barriers & Cardiac Risk Stratification - 10/29/17 1516      Activity Barriers & Cardiac Risk Stratification   Activity Barriers  Arthritis;Back Problems;Deconditioning;Muscular Weakness   Bilateral knee arthritis    Cardiac Risk Stratification  High       6 Minute Walk: 6 Minute Walk    Row Name 10/29/17 1514         6 Minute Walk   Phase  Initial     Distance  491.5 feet     Walk Time  4.15 minutes     # of Rest Breaks  2     MPH  0.93     METS  1.86     RPE  13     VO2 Peak  6.52      Symptoms  Yes (comment)     Comments  Fatigue     Resting HR  96 bpm     Resting BP  110/60     Resting Oxygen Saturation   100 %     Exercise Oxygen Saturation  during 6 min walk  97 %     Max Ex. HR  107  bpm     Max Ex. BP  118/70     2 Minute Post BP  122/72        Oxygen Initial Assessment:   Oxygen Re-Evaluation:   Oxygen Discharge (Final Oxygen Re-Evaluation):   Initial Exercise Prescription: Initial Exercise Prescription - 10/29/17 1500      Date of Initial Exercise RX and Referring Provider   Date  10/29/17    Referring Provider  Dr. Peter Martinique, MD     Expected Discharge Date  02/05/18      Recumbant Bike   Level  1    Watts  5    Minutes  10    METs  2.2      NuStep   Level  1    SPM  75    Minutes  10    METs  1.9      Arm Ergometer   Level  2    Watts  15    Minutes  10    METs  2.05      Prescription Details   Frequency (times per week)  3    Duration  Progress to 30 minutes of continuous aerobic without signs/symptoms of physical distress      Intensity   THRR 40-80% of Max Heartrate  63-126    Ratings of Perceived Exertion  11-13    Perceived Dyspnea  0-4      Progression   Progression  Continue progressive overload as per policy without signs/symptoms or physical distress.      Resistance Training   Training Prescription  Yes    Weight  2    Reps  10-15       Perform Capillary Blood Glucose checks as needed.  Exercise Prescription Changes: Exercise Prescription Changes    Row Name 11/09/17 1453             Response to Exercise   Blood Pressure (Admit)  118/80       Blood Pressure (Exercise)  148/82       Blood Pressure (Exit)  140/80       Heart Rate (Admit)  94 bpm       Heart Rate (Exercise)  107 bpm       Heart Rate (Exit)  82 bpm       Rating of Perceived Exertion (Exercise)  13       Symptoms  none       Duration  Progress to 30 minutes of  aerobic without signs/symptoms of physical distress       Intensity   THRR unchanged         Progression   Progression  Continue to progress workloads to maintain intensity without signs/symptoms of physical distress.       Average METs  1.7         Resistance Training   Training Prescription  Yes       Weight  2       Reps  10-15       Time  10 Minutes         Interval Training   Interval Training  No         Recumbant Bike   Level  1       Watts  5       Minutes  10       METs  1.4         NuStep   Level  1  SPM  75       Minutes  10       METs  1.9         Arm Ergometer   Level  2       Minutes  10          Exercise Comments: Exercise Comments    Row Name 11/09/17 1545 11/18/17 1509         Exercise Comments  Patient tolerated low intensity exercise well without c/o.  Reviewed home exercise guidelines with patient.         Exercise Goals and Review: Exercise Goals    Row Name 10/29/17 1518             Exercise Goals   Increase Physical Activity  Yes       Intervention  Provide advice, education, support and counseling about physical activity/exercise needs.;Develop an individualized exercise prescription for aerobic and resistive training based on initial evaluation findings, risk stratification, comorbidities and participant's personal goals.       Expected Outcomes  Short Term: Attend rehab on a regular basis to increase amount of physical activity.       Increase Strength and Stamina  Yes       Intervention  Provide advice, education, support and counseling about physical activity/exercise needs.;Develop an individualized exercise prescription for aerobic and resistive training based on initial evaluation findings, risk stratification, comorbidities and participant's personal goals.       Expected Outcomes  Short Term: Increase workloads from initial exercise prescription for resistance, speed, and METs.;Short Term: Perform resistance training exercises routinely during rehab and add in resistance training at  home;Long Term: Improve cardiorespiratory fitness, muscular endurance and strength as measured by increased METs and functional capacity (6MWT)       Able to understand and use rate of perceived exertion (RPE) scale  Yes       Intervention  Provide education and explanation on how to use RPE scale       Expected Outcomes  Short Term: Able to use RPE daily in rehab to express subjective intensity level;Long Term:  Able to use RPE to guide intensity level when exercising independently       Knowledge and understanding of Target Heart Rate Range (THRR)  Yes       Intervention  Provide education and explanation of THRR including how the numbers were predicted and where they are located for reference       Expected Outcomes  Short Term: Able to state/look up THRR;Long Term: Able to use THRR to govern intensity when exercising independently;Short Term: Able to use daily as guideline for intensity in rehab       Able to check pulse independently  Yes       Intervention  Provide education and demonstration on how to check pulse in carotid and radial arteries.;Review the importance of being able to check your own pulse for safety during independent exercise       Expected Outcomes  Short Term: Able to explain why pulse checking is important during independent exercise;Long Term: Able to check pulse independently and accurately       Understanding of Exercise Prescription  Yes       Intervention  Provide education, explanation, and written materials on patient's individual exercise prescription       Expected Outcomes  Short Term: Able to explain program exercise prescription;Long Term: Able to explain home exercise prescription to exercise independently  Exercise Goals Re-Evaluation : Exercise Goals Re-Evaluation    Row Name 11/09/17 1545 11/18/17 1509           Exercise Goal Re-Evaluation   Exercise Goals Review  Increase Physical Activity;Able to understand and use rate of perceived exertion  (RPE) scale  Increase Physical Activity;Able to understand and use rate of perceived exertion (RPE) scale;Knowledge and understanding of Target Heart Rate Range (THRR);Understanding of Exercise Prescription;Able to check pulse independently      Comments  Patient able to understand and use RPE scale appropriately.  Reviewed home exercise guidelines with patient including THRR, RPE scale and endpoints for exercise. Pt plans to walk as her mode of home exercise.      Expected Outcomes  Increase workloads as tolerated to help increase cardiorespiratory fitness.  Patient will accumulate 30 minutes of walking at home at least one day/week in addition to exercise at cardiac rehab.          Discharge Exercise Prescription (Final Exercise Prescription Changes): Exercise Prescription Changes - 11/09/17 1453      Response to Exercise   Blood Pressure (Admit)  118/80    Blood Pressure (Exercise)  148/82    Blood Pressure (Exit)  140/80    Heart Rate (Admit)  94 bpm    Heart Rate (Exercise)  107 bpm    Heart Rate (Exit)  82 bpm    Rating of Perceived Exertion (Exercise)  13    Symptoms  none    Duration  Progress to 30 minutes of  aerobic without signs/symptoms of physical distress    Intensity  THRR unchanged      Progression   Progression  Continue to progress workloads to maintain intensity without signs/symptoms of physical distress.    Average METs  1.7      Resistance Training   Training Prescription  Yes    Weight  2    Reps  10-15    Time  10 Minutes      Interval Training   Interval Training  No      Recumbant Bike   Level  1    Watts  5    Minutes  10    METs  1.4      NuStep   Level  1    SPM  75    Minutes  10    METs  1.9      Arm Ergometer   Level  2    Minutes  10       Nutrition:  Target Goals: Understanding of nutrition guidelines, daily intake of sodium '1500mg'$ , cholesterol '200mg'$ , calories 30% from fat and 7% or less from saturated fats, daily to have 5 or  more servings of fruits and vegetables.  Biometrics: Pre Biometrics - 10/29/17 1519      Pre Biometrics   Height  5' 4.5" (1.638 m)    Weight  73 kg    Waist Circumference  44 inches    Hip Circumference  45 inches    Waist to Hip Ratio  0.98 %    BMI (Calculated)  27.21    Triceps Skinfold  16 mm    % Body Fat  38.6 %    Grip Strength  30 kg    Flexibility  14 in    Single Leg Stand  0 seconds        Nutrition Therapy Plan and Nutrition Goals: Nutrition Therapy & Goals - 11/04/17 2706  Nutrition Therapy   Diet  heart healthy, carb modified       Nutrition Assessments: Nutrition Assessments - 11/04/17 0926      MEDFICTS Scores   Pre Score  30       Nutrition Goals Re-Evaluation:   Nutrition Goals Discharge (Final Nutrition Goals Re-Evaluation):   Psychosocial: Target Goals: Acknowledge presence or absence of significant depression and/or stress, maximize coping skills, provide positive support system. Participant is able to verbalize types and ability to use techniques and skills needed for reducing stress and depression.  Initial Review & Psychosocial Screening: Initial Psych Review & Screening - 10/29/17 1620      Initial Review   Current issues with  None Identified      Family Dynamics   Good Support System?  Yes   Leinaala has her grandchildren,sister and daughter for support     Barriers   Psychosocial barriers to participate in program  There are no identifiable barriers or psychosocial needs.      Screening Interventions   Interventions  Encouraged to exercise       Quality of Life Scores: Quality of Life - 10/29/17 1458      Quality of Life   Select  Quality of Life      Quality of Life Scores   Health/Function Pre  23.21 %    Socioeconomic Pre  22.86 %    Psych/Spiritual Pre  22.86 %    Family Pre  27.88 %    GLOBAL Pre  23.64 %      Scores of 19 and below usually indicate a poorer quality of life in these areas.  A difference of   2-3 points is a clinically meaningful difference.  A difference of 2-3 points in the total score of the Quality of Life Index has been associated with significant improvement in overall quality of life, self-image, physical symptoms, and general health in studies assessing change in quality of life.  PHQ-9: Recent Review Flowsheet Data    Depression screen [facility] 2/9 11/10/2017 11/09/2017 11/04/2017 09/04/2017 06/12/2017   Decreased Interest 0 0 0 0 0   Down, Depressed, Hopeless 0 0 0 0 0   PHQ - 2 Score 0 0 0 0 0     Interpretation of Total Score  Total Score Depression Severity:  1-4 = Minimal depression, 5-9 = Mild depression, 10-14 = Moderate depression, 15-19 = Moderately severe depression, 20-27 = Severe depression   Psychosocial Evaluation and Intervention:   Psychosocial Re-Evaluation: Psychosocial Re-Evaluation    [place] Name 11/19/17 1448             Psychosocial Re-Evaluation   Current issues with  None Identified       Interventions  Encouraged to attend Cardiac Rehabilitation for the exercise       Continue Psychosocial Services   No Follow up required          Psychosocial Discharge (Final Psychosocial Re-Evaluation): Psychosocial Re-Evaluation - 11/19/17 1448      Psychosocial Re-Evaluation   Current issues with  None Identified    Interventions  Encouraged to attend Cardiac Rehabilitation for the exercise    Continue Psychosocial Services   No Follow up required       Vocational Rehabilitation: Provide vocational rehab assistance to qualifying candidates.   Vocational Rehab Evaluation & Intervention: Vocational Rehab - 10/29/17 1623      Initial Vocational Rehab Evaluation & Intervention   Assessment shows need for Vocational Rehabilitation  No  Education: Education Goals: Education classes will be provided on a weekly basis, covering required topics. Participant will state understanding/return demonstration of topics presented.  Learning  Barriers/Preferences: Learning Barriers/Preferences - 10/29/17 1521      Learning Barriers/Preferences   Learning Barriers  None    Learning Preferences  Written Material       Education Topics: Hypertension, Hypertension Reduction -Define heart disease and high blood pressure. Discus how high blood pressure affects the body and ways to reduce high blood pressure.   Exercise and Your Heart -Discuss why it is important to exercise, the FITT principles of exercise, normal and abnormal responses to exercise, and how to exercise safely.   Angina -Discuss definition of angina, causes of angina, treatment of angina, and how to decrease risk of having angina.   Cardiac Medications -Review what the following cardiac medications are used for, how they affect the body, and side effects that may occur when taking the medications.  Medications include Aspirin, Beta blockers, calcium channel blockers, ACE Inhibitors, angiotensin receptor blockers, diuretics, digoxin, and antihyperlipidemics.   Congestive Heart Failure -Discuss the definition of CHF, how to live with CHF, the signs and symptoms of CHF, and how keep track of weight and sodium intake.   Heart Disease and Intimacy -Discus the effect sexual activity has on the heart, how changes occur during intimacy as we age, and safety during sexual activity.   Smoking Cessation / COPD -Discuss different methods to quit smoking, the health benefits of quitting smoking, and the definition of COPD.   Nutrition I: Fats -Discuss the types of cholesterol, what cholesterol does to the heart, and how cholesterol levels can be controlled.   Nutrition II: Labels -Discuss the different components of food labels and how to read food label   Heart Parts/Heart Disease and PAD -Discuss the anatomy of the heart, the pathway of blood circulation through the heart, and these are affected by heart disease.   Stress I: Signs and Symptoms -Discuss  the causes of stress, how stress may lead to anxiety and depression, and ways to limit stress.   Stress II: Relaxation -Discuss different types of relaxation techniques to limit stress.   Warning Signs of Stroke / TIA -Discuss definition of a stroke, what the signs and symptoms are of a stroke, and how to identify when someone is having stroke.   Knowledge Questionnaire Score: Knowledge Questionnaire Score - 10/29/17 1524      Knowledge Questionnaire Score   Pre Score  21/24       Core Components/Risk Factors/Patient Goals at Admission: Personal Goals and Risk Factors at Admission - 10/29/17 1522      Core Components/Risk Factors/Patient Goals on Admission    Weight Management  Weight Loss;Yes    Intervention  Weight Management: Develop a combined nutrition and exercise program designed to reach desired caloric intake, while maintaining appropriate intake of nutrient and fiber, sodium and fats, and appropriate energy expenditure required for the weight goal.;Weight Management: Provide education and appropriate resources to help participant work on and attain dietary goals.;Weight Management/Obesity: Establish reasonable short term and long term weight goals.    Admit Weight  160 lb 15 oz (73 kg)    Goal Weight: Long Term  140 lb (63.5 kg)    Expected Outcomes  Short Term: Continue to assess and modify interventions until short term weight is achieved;Long Term: Adherence to nutrition and physical activity/exercise program aimed toward attainment of established weight goal;Weight Loss: Understanding of general recommendations for a  balanced deficit meal plan, which promotes 1-2 lb weight loss per week and includes a negative energy balance of [phone] kcal/d;Understanding recommendations for meals to include 15-35% energy as protein, 25-35% energy from fat, 35-60% energy from carbohydrates, less than '200mg'$  of dietary cholesterol, 20-35 gm of total fiber daily;Understanding of distribution  of calorie intake throughout the day with the consumption of 4-5 meals/snacks    Diabetes  Yes    Intervention  Provide education about signs/symptoms and action to take for hypo/hyperglycemia.;Provide education about proper nutrition, including hydration, and aerobic/resistive exercise prescription along with prescribed medications to achieve blood glucose in normal ranges: Fasting glucose 65-99 mg/dL    Expected Outcomes  Short Term: Participant verbalizes understanding of the signs/symptoms and immediate care of hyper/hypoglycemia, proper foot care and importance of medication, aerobic/resistive exercise and nutrition plan for blood glucose control.;Long Term: Attainment of HbA1C < 7%.    Hypertension  Yes    Intervention  Provide education on lifestyle modifcations including regular physical activity/exercise, weight management, moderate sodium restriction and increased consumption of fresh fruit, vegetables, and low fat dairy, alcohol moderation, and smoking cessation.;Monitor prescription use compliance.    Expected Outcomes  Short Term: Continued assessment and intervention until BP is < 140/58m HG in hypertensive participants. < 130/831mHG in hypertensive participants with diabetes, heart failure or chronic kidney disease.;Long Term: Maintenance of blood pressure at goal levels.    Lipids  Yes    Intervention  Provide education and support for participant on nutrition & aerobic/resistive exercise along with prescribed medications to achieve LDL '70mg'$ , HDL >'40mg'$ .    Expected Outcomes  Short Term: Participant states understanding of desired cholesterol values and is compliant with medications prescribed. Participant is following exercise prescription and nutrition guidelines.;Long Term: Cholesterol controlled with medications as prescribed, with individualized exercise RX and with personalized nutrition plan. Value goals: LDL < '70mg'$ , HDL > 40 mg.       Core Components/Risk Factors/Patient Goals  Review:  Goals and Risk Factor Review    Row Name 11/19/17 1452             Core Components/Risk Factors/Patient Goals Review   Personal Goals Review  Weight Management/Obesity;Diabetes;Hypertension;Lipids       Review  Kloi's vital signs and self reported CBg's have been in the 200's. MaKirtis doing well with exercise and enjoying partcipating in phase 2 cardiac rehab. MaWaylynnontinues to use her rolling walker for stability.       Expected Outcomes  Patient will participate in phase 2 cardiac rehab for exercise. Follow nutrtion and lifestyle opportunities.          Core Components/Risk Factors/Patient Goals at Discharge (Final Review):  Goals and Risk Factor Review - 11/19/17 1452      Core Components/Risk Factors/Patient Goals Review   Personal Goals Review  Weight Management/Obesity;Diabetes;Hypertension;Lipids    Review  Moneka's vital signs and self reported CBg's have been in the 200's. MaKlynns doing well with exercise and enjoying partcipating in phase 2 cardiac rehab. MaAlpaontinues to use her rolling walker for stability.    Expected Outcomes  Patient will participate in phase 2 cardiac rehab for exercise. Follow nutrtion and lifestyle opportunities.       ITP Comments: ITP Comments    Row Name 10/29/17 1513 11/19/17 1446         ITP Comments  Dr. TrFransico HimMedical Director   30 Day ITP Review. Patient with good partcipation and attendance in phase 2 cardiac rehab.  Comments: See ITP comments.Barnet Pall, RN,BSN 11/19/2017 2:59 PM

## 2017-11-20 ENCOUNTER — Ambulatory Visit (HOSPITAL_COMMUNITY): Payer: Medicaid Other

## 2017-11-20 ENCOUNTER — Encounter (HOSPITAL_COMMUNITY): Payer: Medicaid Other

## 2017-11-23 ENCOUNTER — Telehealth (HOSPITAL_COMMUNITY): Payer: Self-pay | Admitting: Family Medicine

## 2017-11-23 ENCOUNTER — Ambulatory Visit (HOSPITAL_COMMUNITY): Payer: Medicaid Other

## 2017-11-23 ENCOUNTER — Encounter (HOSPITAL_COMMUNITY): Payer: Medicaid Other

## 2017-11-24 ENCOUNTER — Encounter: Payer: Self-pay | Admitting: Cardiology

## 2017-11-24 ENCOUNTER — Ambulatory Visit (INDEPENDENT_AMBULATORY_CARE_PROVIDER_SITE_OTHER): Payer: Medicaid Other | Admitting: Cardiology

## 2017-11-24 VITALS — BP 141/91 | HR 79 | Ht 65.5 in | Wt 171.2 lb

## 2017-11-24 DIAGNOSIS — E7800 Pure hypercholesterolemia, unspecified: Secondary | ICD-10-CM | POA: Diagnosis not present

## 2017-11-24 DIAGNOSIS — I2510 Atherosclerotic heart disease of native coronary artery without angina pectoris: Secondary | ICD-10-CM

## 2017-11-24 DIAGNOSIS — I10 Essential (primary) hypertension: Secondary | ICD-10-CM | POA: Diagnosis not present

## 2017-11-24 DIAGNOSIS — I639 Cerebral infarction, unspecified: Secondary | ICD-10-CM | POA: Diagnosis not present

## 2017-11-24 NOTE — Patient Instructions (Signed)
We will add some additional lab work when you get your next lab draw.  Continue your current therapy  Follow up in 6 months.

## 2017-11-25 ENCOUNTER — Encounter (HOSPITAL_COMMUNITY)
Admission: RE | Admit: 2017-11-25 | Discharge: 2017-11-25 | Disposition: A | Discharge status: Home / Self Care | Payer: Medicaid Other | Source: Ambulatory Visit | Attending: Cardiology | Admitting: Cardiology

## 2017-11-25 ENCOUNTER — Encounter (HOSPITAL_COMMUNITY): Payer: Medicaid Other

## 2017-11-25 ENCOUNTER — Ambulatory Visit (HOSPITAL_COMMUNITY): Payer: Medicaid Other

## 2017-11-25 DIAGNOSIS — I214 Non-ST elevation (NSTEMI) myocardial infarction: Secondary | ICD-10-CM

## 2017-11-25 DIAGNOSIS — I252 Old myocardial infarction: Secondary | ICD-10-CM | POA: Diagnosis not present

## 2017-11-25 DIAGNOSIS — Z794 Long term (current) use of insulin: Secondary | ICD-10-CM | POA: Diagnosis not present

## 2017-11-25 DIAGNOSIS — E119 Type 2 diabetes mellitus without complications: Secondary | ICD-10-CM | POA: Diagnosis not present

## 2017-11-25 DIAGNOSIS — E785 Hyperlipidemia, unspecified: Secondary | ICD-10-CM | POA: Diagnosis not present

## 2017-11-25 DIAGNOSIS — Z8673 Personal history of transient ischemic attack (TIA), and cerebral infarction without residual deficits: Secondary | ICD-10-CM | POA: Insufficient documentation

## 2017-11-25 DIAGNOSIS — Z955 Presence of coronary angioplasty implant and graft: Secondary | ICD-10-CM | POA: Diagnosis not present

## 2017-11-25 DIAGNOSIS — M1990 Unspecified osteoarthritis, unspecified site: Secondary | ICD-10-CM | POA: Insufficient documentation

## 2017-11-25 DIAGNOSIS — Z7902 Long term (current) use of antithrombotics/antiplatelets: Secondary | ICD-10-CM | POA: Insufficient documentation

## 2017-11-25 DIAGNOSIS — Z7982 Long term (current) use of aspirin: Secondary | ICD-10-CM | POA: Insufficient documentation

## 2017-11-25 DIAGNOSIS — Z87891 Personal history of nicotine dependence: Secondary | ICD-10-CM | POA: Insufficient documentation

## 2017-11-25 DIAGNOSIS — Z79899 Other long term (current) drug therapy: Secondary | ICD-10-CM | POA: Insufficient documentation

## 2017-11-25 DIAGNOSIS — G4730 Sleep apnea, unspecified: Secondary | ICD-10-CM | POA: Insufficient documentation

## 2017-11-25 DIAGNOSIS — I10 Essential (primary) hypertension: Secondary | ICD-10-CM | POA: Insufficient documentation

## 2017-11-27 ENCOUNTER — Ambulatory Visit (HOSPITAL_COMMUNITY): Payer: Medicaid Other

## 2017-11-27 ENCOUNTER — Encounter (HOSPITAL_COMMUNITY): Payer: Medicaid Other

## 2017-11-27 ENCOUNTER — Encounter (HOSPITAL_COMMUNITY)
Admission: RE | Admit: 2017-11-27 | Discharge: 2017-11-27 | Disposition: A | Discharge status: Home / Self Care | Payer: Medicaid Other | Source: Ambulatory Visit | Attending: Cardiology | Admitting: Cardiology

## 2017-11-27 DIAGNOSIS — I214 Non-ST elevation (NSTEMI) myocardial infarction: Secondary | ICD-10-CM

## 2017-11-27 DIAGNOSIS — I252 Old myocardial infarction: Secondary | ICD-10-CM | POA: Diagnosis not present

## 2017-11-27 DIAGNOSIS — Z955 Presence of coronary angioplasty implant and graft: Secondary | ICD-10-CM

## 2017-11-30 ENCOUNTER — Ambulatory Visit (HOSPITAL_COMMUNITY): Payer: Medicaid Other

## 2017-11-30 ENCOUNTER — Encounter (HOSPITAL_COMMUNITY): Payer: Medicaid Other

## 2017-12-02 ENCOUNTER — Encounter (HOSPITAL_COMMUNITY)
Admission: RE | Admit: 2017-12-02 | Discharge: 2017-12-02 | Disposition: A | Discharge status: Home / Self Care | Payer: Medicaid Other | Source: Ambulatory Visit | Attending: Cardiology | Admitting: Cardiology

## 2017-12-02 ENCOUNTER — Encounter (HOSPITAL_COMMUNITY): Payer: Medicaid Other

## 2017-12-02 ENCOUNTER — Ambulatory Visit (HOSPITAL_COMMUNITY): Payer: Medicaid Other

## 2017-12-02 DIAGNOSIS — I214 Non-ST elevation (NSTEMI) myocardial infarction: Secondary | ICD-10-CM

## 2017-12-02 DIAGNOSIS — I252 Old myocardial infarction: Secondary | ICD-10-CM | POA: Diagnosis not present

## 2017-12-02 DIAGNOSIS — Z955 Presence of coronary angioplasty implant and graft: Secondary | ICD-10-CM

## 2017-12-03 ENCOUNTER — Institutional Professional Consult (permissible substitution): Payer: Medicaid Other | Admitting: Neurology

## 2017-12-03 ENCOUNTER — Ambulatory Visit: Payer: Medicaid Other | Admitting: Neurology

## 2017-12-03 ENCOUNTER — Encounter: Payer: Self-pay | Admitting: Neurology

## 2017-12-03 VITALS — BP 135/73 | HR 74 | Ht 65.5 in | Wt 175.0 lb

## 2017-12-03 DIAGNOSIS — I63532 Cerebral infarction due to unspecified occlusion or stenosis of left posterior cerebral artery: Secondary | ICD-10-CM | POA: Insufficient documentation

## 2017-12-03 DIAGNOSIS — G4719 Other hypersomnia: Secondary | ICD-10-CM

## 2017-12-03 DIAGNOSIS — R0683 Snoring: Secondary | ICD-10-CM

## 2017-12-03 DIAGNOSIS — I10 Essential (primary) hypertension: Secondary | ICD-10-CM

## 2017-12-03 DIAGNOSIS — E1165 Type 2 diabetes mellitus with hyperglycemia: Secondary | ICD-10-CM | POA: Diagnosis not present

## 2017-12-03 DIAGNOSIS — G4733 Obstructive sleep apnea (adult) (pediatric): Secondary | ICD-10-CM

## 2017-12-03 HISTORY — DX: Cerebral infarction due to unspecified occlusion or stenosis of left posterior cerebral artery: I63.532

## 2017-12-03 HISTORY — DX: Other hypersomnia: G47.19

## 2017-12-03 NOTE — Progress Notes (Signed)
SLEEP MEDICINE CLINIC   Provider:  Larey Seat, MD   Primary Care Physician: [facility]   Daisy Floro, DO   Referring Provider: Leonie Man, MD   Chief Complaint  Patient presents with  . New Patient (Initial Visit)    pt alone, rm 10. pt states that she had a sleep study over 5 yrs ago and was started on CPAP for treatment. pt states CPAP machine broke and she never started back up.     HPI:  Holly Acosta is a 62 y.o. female patient and seen  on 12-03-2017 upon referral for a sleep evaluation.  Holly Acosta is a [facility] patient with the resident clinic, she was admitted to [facility] on 27 April 2017 for a cardiac infarct status post stent placement and she has been using aspirin and Brilinta.  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  Half function at the time was an ejection fraction of 55 to 60% with multi regional hypokinesis.  The patient has known diabetes which has been poorly controlled at the time her HbA1c was 13.3.  Additional risk factors for stroke and heart disease are hyperlipidemia, obesity and untreated obstructive sleep apnea.  The patient was seen by Dr. Cari Caraway and then by Dr. Leonie Man on 06 October 2017 HbA1c was now 9.3, still very elevated but improved.  She has a little bit of bruising and bleeding from Brilinta in combination with aspirin.  She is using a walker rehab had been delayed due to transportation problems.  She takes Crestor and her blood pressure has been controlled.    Her previous sleep study 9/20/ 2011 was ordered through Drs. Mulberry and MacPherson, it revealed severe OSA at an AHI of 48/h. CPAP therapy was recommnended. and sleep evaluations were obtained through the  [facility] lab. The machine is 62 years old and now  broken. She can't remember who followed her in the [facility] sleep medicine clinic. The study was read by Dr. Baird Lyons.    Chief complaint according to patient :" I may have to go back on CPAP - but I sleep fairly good" and " I easily fall asleep when I am inactive and not stimulated".  Sleep habits are as follows:Donner time is around 7 Pm, and she goes to bed most nights between 10 and 11 PM. Has a seperate bedroom from her living room, and retreats at that time. The bedroom is kept cool, quiet and dark. She sleeps on her back , some times on her left side. She needs 2 pillows and likes the head of bed elevated, sleeps with a fan in the background. She is not dreaming much, she stated.  Reports only one bathroom break around 4-5 AM. Wakes sponatanously between 6 and 7 AM , and she estimates 7-8 hours of sleep each night.  She wakes up refreshed. No headaches in AM- but sometimes with a dry mouth.    She naps in daytime, unscheduled. She lives with 2 granddaughters, who reported her snoring loudly and witnessed her having sleep apnea.    Medical history : already in record, reviewed.   Family history : Sister is only family member with sleep apnea.   Social history: her  2 granddaughters live at her house , she is divorced. Disability due to knee osteoarthritis, she became disabled 2015 and has yet not received hip or knee surgery . She quit smoking over 20 years ago, but one granddaughter smokes in the house. ETOH- none, caffeine - she drinks coffee in AM , iced tea for lunch and dinner.    Review of Systems: Out of a complete 14 system review, the patient complains of only the following symptoms, and all other reviewed systems are negative. None endorsed. snoring , witnessed apnea.  PICA : " I don't know if the stroke did anything to me "   Epworth score 19/ 24  , Fatigue severity score 23/ 63   , depression score 3/ 15    Social  History   Socioeconomic History  . Marital status: Divorced    Spouse name: Not on file  . Number of children: Not on file  . Years of education: Not on file  . Highest education level: Not on file  Occupational History  . Not on file  Social Needs  . Financial resource strain: Not on file  . Food insecurity:    Worry: Not on file    Inability: Not on file  . Transportation needs:    Medical: Not on file    Non-medical: Not on file  Tobacco Use  . Smoking status: Former Smoker    Packs/day: 1.00    Years: 33.00    Pack years: 33.00    Types: Cigarettes    Start date: 01/20/1973    Last attempt to quit: 11/04/2006    Years since quitting: 11.0  . Smokeless tobacco: Never Used  Substance and Sexual Activity  . Alcohol use: Never    Alcohol/week: 0.0 standard drinks    Frequency: Never  . Drug use: Never  . Sexual activity: Not Currently  Lifestyle  . Physical activity:    Days per week: Not on file    Minutes per session: Not on file  . Stress: Not on file  Relationships  . Social connections:    Talks on phone: Not on file    Gets together: Not on file    Attends religious service: Not on file    Active member of club or organization: Not on file    Attends meetings of clubs or organizations: Not on file    Relationship status: Not on file  . Intimate partner violence:    Fear of current or ex partner: Not on file    Emotionally abused: Not on file    Physically abused: Not on file    Forced sexual activity: Not on file  Other Topics Concern  . Not on file  Social History Narrative   Lives with daughter [name] age 54) and 4 grandchildren.   Disability - Arthritis. Does get disability check.   Does not have a living will yet    Family History  Problem Relation Age of Onset  . Diabetes Mother   . Hypertension Mother   . Hypertension Father     Past Medical History:  Diagnosis Date  . Acute cerebrovascular accident (CVA) of cerebellum ([place]) 04/29/2017  .  Anemia   . Arthritis of knee    "both knees" (04/24/2017)  . Coronary artery disease involving native coronary artery of native heart with unstable angina pectoris ([place]) 04/30/2017  . Diabetes mellitus, type 2 ([place])   . History of blood transfusion    "related to prolapsed uterus"  .  HLD (hyperlipidemia)   . HTN (hypertension)   . Non-ST elevation (NSTEMI) myocardial infarction ([place])   . Prolapsed uterus   . Sleep apnea    "have mask; haven't worn it for a long time" (04/24/2017)  . Status post coronary artery stent placement     Past Surgical History:  Procedure Laterality Date  . CARDIAC CATHETERIZATION  2011  . CORONARY STENT INTERVENTION N/A 04/27/2017   Procedure: CORONARY STENT INTERVENTION;  Surgeon: Jettie Booze, MD;  Location: [facility];  Service: Cardiovascular;  Laterality: N/A;  . LEFT HEART CATH AND CORONARY ANGIOGRAPHY N/A 04/27/2017   Procedure: LEFT HEART CATH AND CORONARY ANGIOGRAPHY;  Surgeon: Jettie Booze, MD;  Location: [facility];  Service: Cardiovascular;  Laterality: N/A;  . TUBAL LIGATION      Current Outpatient Medications  Medication Sig Dispense Refill  . ACCU-CHEK FASTCLIX LANCETS MISC 1 each by Does not apply route daily. Check sugar daily as needed 102 each 3  . acetaminophen (TYLENOL) 500 MG tablet Take 1,000 mg by mouth every 12 (twelve) hours as needed for mild pain.     Marland Kitchen amLODipine (NORVASC) 5 MG tablet Take 1 tablet (5 mg total) by mouth daily. 90 tablet 3  . aspirin EC 81 MG tablet Take 1 tablet (81 mg total) by mouth daily. 90 tablet 3  . B-D UF III MINI PEN NEEDLES 31G X 5 MM MISC USE AS DIRECTED 3 TIMES A DAY 100 each 1  . Blood Glucose Monitoring Suppl (ACCU-CHEK NANO SMARTVIEW) w/Device KIT Use to test blood sugar up to 3 times a day. ICD-10 code: E11.9 1 kit 0  . carvedilol (COREG) 12.5 MG tablet Take 1 tablet (12.5 mg total) by mouth 2 (two) times daily with a meal. 180 tablet 3  . glucose blood (ACCU-CHEK AVIVA)  test strip Use as instructed 100 each 12  . glucose blood (ACCU-CHEK SMARTVIEW) test strip Use as instructed 100 each 12  . insulin aspart (NOVOLOG) 100 UNIT/ML injection Inject 2-8 Units into the skin See admin instructions. Sliding scale: 201-250: 2 units; 251-300: 4 units; 301-350: 6 units; 351-400: 8 units.    . Insulin Glargine (LANTUS SOLOSTAR) 100 UNIT/ML Solostar Pen INJECT 32 units into skin dialy 45 pen 3  . lisinopril (PRINIVIL,ZESTRIL) 20 MG tablet Take 1 tablet (20 mg total) by mouth daily. 90 tablet 3  . metFORMIN (GLUCOPHAGE) 1000 MG tablet TAKE 1 TABLET BY MOUTH TWICE DAILY WITH A MEAL Resume on 04/30/17. 60 tablet 2  . NOVOLOG FLEXPEN 100 UNIT/ML FlexPen INJECT 20 UNITS INTO THE SKIN 3 TIMES DAILY WITH MEALS. (Patient taking differently: Inject subcutaneously as directed per sliding scale. Sliding scale: 201-250: 2 units; 251-300: 4 units; 301-350: 6 units; 351-400: 8 units.) 15 pen 2  . rosuvastatin (CRESTOR) 40 MG tablet Take 1 tablet (40 mg total) by mouth daily. 90 tablet 3  . ticagrelor (BRILINTA) 90 MG TABS tablet Take 1 tablet (90 mg total) by mouth 2 (two) times daily. 180 tablet 3   No current facility-administered medications for this visit.     Allergies as of 12/03/2017 - Review Complete 12/03/2017  Allergen Reaction Noted  . Lipitor [atorvastatin] Other (See Comments) 09/12/2013  . Diclofenac Nausea Only and Other (See Comments) 12/11/2011    Vitals: BP 135/73   Pulse 74   Ht 5' 5.5" (1.664 m)   Wt 175 lb (79.4 kg)   BMI 28.68 kg/m  Last Weight:  Wt Readings from Last 1  Encounters:  12/03/17 175 lb (79.4 kg)   WRU:EAVW mass index is 28.68 kg/m.     Last Height:   Ht Readings from Last 1 Encounters:  12/03/17 5' 5.5" (1.664 m)    Physical exam:  General: The patient is awake, alert and appears not in acute distress. The patient is seated ,  Head: Normocephalic, atraumatic. Neck is supple. Mallampati 3- dentures on top, bottom with biological teeth -  lost teeth due to receeding gum line. ,  neck circumference:14 . Nasal airflow congested ,   Retrognathia is not seen.  Cardiovascular:  Regular rate and rhythm , without  murmurs or carotid bruit, and without distended neck veins. Respiratory: Lungs are clear to auscultation. Skin:  Without ankle edema.  Trunk: BMI is 28.6 .  Neurologic exam : The patient is awake and alert, oriented to place and time.    Cranial nerves:  taste and smell are reportedly intact. Pupils are equal and briskly reactive to light. Extraocular movements  in vertical and horizontal planes intact , with a nystagmus on horizontal gaze to the left ,. Visual fields by finger perimetry are intact. Hearing to finger rub intact.  Facial sensation intact to fine touch.  Facial motor strength is symmetric and tongue and uvula move midline. Shoulder shrug was symmetrical.   Motor exam: Normal tone, muscle bulk in all extremities. Reports ROM limitation from pain in joints.  Sensory:  Reduced vibration and fine touch sense in both feet, both hands.  Coordination:  Finger-to-nose maneuver  normal without evidence of ataxia, dysmetria or tremor. Gait and station:  Deferred - walks with walker .    Assessment:  After physical and neurologic examination, review of laboratory studies,  Personal review of imaging studies, reports of other /same  Imaging studies, results of polysomnography and / or neurophysiology testing and pre-existing records as far as provided in visit., my assessment is   1) Mrs. Chauncey Cruel. Googe is a 62 year old female patient with multiple risk factors for obstructive sleep apnea, and she was diagnosed 8 years ago with severe obstructive sleep apnea at an AHI of 48.4/h with moderately loud snoring and an oxygen nadir of 64%.  She was titrated to CPAP at 13 cmH2O pressure her AHI was reduced to 3.3/h and she was fitted with a Mirage Quatro mask ,small size.    I was able to localize the previous sleep study she  had clear REM sleep accentuation of her apnea which made her an excellent candidate for CPAP but not for example for a dental device.  She slept much on the night of the night on her back.  Looking at her titration study she slept well with CPAP at the time.    In the meantime she has not lost a lot of weight - her BMI was only 31 at that time and her last visit here BMI was 29.  So her risk factors of may not have been modified by much.    She does have poorly controlled diabetes, she has a history of hypertension, high cholesterol and pica stroke.  She does need to be reevaluated for her current degree of apnea in order to resume CPAP therapy as needed.  I will order an attended sleep study as a split-night protocol at [place] [facility], the patient may tolerate the same mask she used before she is willing to use a full facemask.    Please schedule an attended sleep study.    sincerely,  Veleria Barnhardt MD CC Dr. Leonie Man, MD and Venancio Poisson, NP .   The patient was advised of the nature of the diagnosed disorder , the treatment options and the  risks for general health and wellness arising from not treating the condition.   I spent more than 45 minutes of face to face time with the patient.  Greater than 50% of time was spent in counseling and coordination of care. We have discussed the diagnosis and differential and I answered the patient's questions.     Cc Dr Leonie Man and Venancio Poisson, NP  Dr. Ouida Sills at [place], [facility] .   Larey Seat, MD 03/49/1791, 5:05 PM  Certified in Neurology by ABPN Certified in [place] by [facility] [address] [place], [place] [address]

## 2017-12-03 NOTE — Patient Instructions (Signed)

## 2017-12-04 ENCOUNTER — Encounter (HOSPITAL_COMMUNITY): Payer: Medicaid Other

## 2017-12-07 ENCOUNTER — Encounter (HOSPITAL_COMMUNITY)
Admission: RE | Admit: 2017-12-07 | Discharge: 2017-12-07 | Disposition: A | Discharge status: Home / Self Care | Payer: Medicaid Other | Source: Ambulatory Visit | Attending: Cardiology | Admitting: Cardiology

## 2017-12-07 ENCOUNTER — Encounter (HOSPITAL_COMMUNITY): Payer: Medicaid Other

## 2017-12-07 DIAGNOSIS — I252 Old myocardial infarction: Secondary | ICD-10-CM | POA: Diagnosis not present

## 2017-12-07 DIAGNOSIS — I214 Non-ST elevation (NSTEMI) myocardial infarction: Secondary | ICD-10-CM

## 2017-12-07 DIAGNOSIS — Z955 Presence of coronary angioplasty implant and graft: Secondary | ICD-10-CM

## 2017-12-09 ENCOUNTER — Encounter (HOSPITAL_COMMUNITY)
Admission: RE | Admit: 2017-12-09 | Discharge: 2017-12-09 | Disposition: A | Discharge status: Home / Self Care | Payer: Medicaid Other | Source: Ambulatory Visit | Attending: Cardiology | Admitting: Cardiology

## 2017-12-09 ENCOUNTER — Encounter (HOSPITAL_COMMUNITY): Payer: Medicaid Other

## 2017-12-09 DIAGNOSIS — I214 Non-ST elevation (NSTEMI) myocardial infarction: Secondary | ICD-10-CM

## 2017-12-09 DIAGNOSIS — Z955 Presence of coronary angioplasty implant and graft: Secondary | ICD-10-CM

## 2017-12-09 DIAGNOSIS — I252 Old myocardial infarction: Secondary | ICD-10-CM | POA: Diagnosis not present

## 2017-12-10 NOTE — Progress Notes (Signed)
Cardiac Individual Treatment Plan  Patient Details  Name: Holly Acosta MRN: [id number] Date of Birth: [birth year] Referring Provider:     CARDIAC REHAB PHASE II ORIENTATION from 10/29/2017 in [place]  Referring Provider  Dr. Peter Martinique, MD       Initial Encounter Date:    CARDIAC REHAB PHASE II ORIENTATION from 10/29/2017 in [place]  Date  10/29/17      Visit Diagnosis: NSTEMI (non-ST elevated myocardial infarction) [facility])  Status post coronary artery stent placement, DES CFX, 04/24/17   Patient's Home Medications on Admission:  Current Outpatient Medications:  .  ACCU-CHEK FASTCLIX LANCETS MISC, 1 each by Does not apply route daily. Check sugar daily as needed, Disp: 102 each, Rfl: 3 .  acetaminophen (TYLENOL) 500 MG tablet, Take 1,000 mg by mouth every 12 (twelve) hours as needed for mild pain. , Disp: , Rfl:  .  amLODipine (NORVASC) 5 MG tablet, Take 1 tablet (5 mg total) by mouth daily., Disp: 90 tablet, Rfl: 3 .  aspirin EC 81 MG tablet, Take 1 tablet (81 mg total) by mouth daily., Disp: 90 tablet, Rfl: 3 .  B-D UF III MINI PEN NEEDLES 31G X 5 MM MISC, USE AS DIRECTED 3 TIMES A DAY, Disp: 100 each, Rfl: 1 .  Blood Glucose Monitoring Suppl (ACCU-CHEK NANO SMARTVIEW) w/Device KIT, Use to test blood sugar up to 3 times a day. ICD-10 code: E11.9, Disp: 1 kit, Rfl: 0 .  carvedilol (COREG) 12.5 MG tablet, Take 1 tablet (12.5 mg total) by mouth 2 (two) times daily with a meal., Disp: 180 tablet, Rfl: 3 .  glucose blood (ACCU-CHEK AVIVA) test strip, Use as instructed, Disp: 100 each, Rfl: 12 .  glucose blood (ACCU-CHEK SMARTVIEW) test strip, Use as instructed, Disp: 100 each, Rfl: 12 .  insulin aspart (NOVOLOG) 100 UNIT/ML injection, Inject 2-8 Units into the skin See admin instructions. Sliding scale: 201-250: 2 units; 251-300: 4 units; 301-350: 6 units; 351-400: 8 units., Disp: , Rfl:  .  Insulin Glargine (LANTUS  SOLOSTAR) 100 UNIT/ML Solostar Pen, INJECT 32 units into skin dialy, Disp: 45 pen, Rfl: 3 .  lisinopril (PRINIVIL,ZESTRIL) 20 MG tablet, Take 1 tablet (20 mg total) by mouth daily., Disp: 90 tablet, Rfl: 3 .  metFORMIN (GLUCOPHAGE) 1000 MG tablet, TAKE 1 TABLET BY MOUTH TWICE DAILY WITH A MEAL Resume on 04/30/17., Disp: 60 tablet, Rfl: 2 .  NOVOLOG FLEXPEN 100 UNIT/ML FlexPen, INJECT 20 UNITS INTO THE SKIN 3 TIMES DAILY WITH MEALS. (Patient taking differently: Inject subcutaneously as directed per sliding scale. Sliding scale: 201-250: 2 units; 251-300: 4 units; 301-350: 6 units; 351-400: 8 units.), Disp: 15 pen, Rfl: 2 .  rosuvastatin (CRESTOR) 40 MG tablet, Take 1 tablet (40 mg total) by mouth daily., Disp: 90 tablet, Rfl: 3 .  ticagrelor (BRILINTA) 90 MG TABS tablet, Take 1 tablet (90 mg total) by mouth 2 (two) times daily., Disp: 180 tablet, Rfl: 3  Past Medical History: Past Medical History:  Diagnosis Date  . Acute cerebrovascular accident (CVA) of cerebellum ([place]) 04/29/2017  . Anemia   . Arthritis of knee    "both knees" (04/24/2017)  . Coronary artery disease involving native coronary artery of native heart with unstable angina pectoris ([place]) 04/30/2017  . Diabetes mellitus, type 2 ([place])   . History of blood transfusion    "related to prolapsed uterus"  . HLD (hyperlipidemia)   . HTN (hypertension)   . Non-ST  elevation (NSTEMI) myocardial infarction ([place])   . Prolapsed uterus   . Sleep apnea    "have mask; haven't worn it for a long time" (04/24/2017)  . Status post coronary artery stent placement     Tobacco Use: Social History   Tobacco Use  Smoking Status Former Smoker  . Packs/day: 1.00  . Years: 33.00  . Pack years: 33.00  . Types: Cigarettes  . Start date: 01/20/1973  . Last attempt to quit: 11/04/2006  . Years since quitting: 11.1  Smokeless Tobacco Never Used    Labs: Recent Chemical engineer    Labs for ITP Cardiac and Pulmonary Rehab Latest Ref Rng & Units  07/31/2016 04/25/2017 04/28/2017 04/29/2017 09/04/2017   Cholestrol 0 - 200 mg/dL - 230(H) - 173 -   LDLCALC 0 - 99 mg/dL - UNABLE TO CALCULATE IF TRIGLYCERIDE OVER 400 mg/dL - 107(H) -   LDLDIRECT mg/dL - - - - -   HDL >40 mg/dL - 30(L) - 34(L) -   Trlycerides <150 mg/dL - 518(H) - 159(H) -   Hemoglobin A1c 0.0 - 7.0 % 13.7 - - 13.3(H) 9.3(A)   PHART 7.350 - 7.450 - - 7.477(H) - -   PCO2ART 32.0 - 48.0 mmHg - - 34.9 - -   HCO3 20.0 - 28.0 mmol/L - - 25.7 - -   O2SAT % - - 89.2 - -      Capillary Blood Glucose: Lab Results  Component Value Date   GLUCAP 140 (H) 11/09/2017   GLUCAP 160 (H) 11/09/2017   GLUCAP 348 (H) 11/04/2017   GLUCAP 97 05/14/2017   GLUCAP 175 (H) 05/04/2017     Exercise Target Goals: Exercise Program Goal: Individual exercise prescription set using results from initial 6 min walk test and THRR while considering  patient's activity barriers and safety.   Exercise Prescription Goal: Initial exercise prescription builds to 30-45 minutes a day of aerobic activity, 2-3 days per week.  Home exercise guidelines will be given to patient during program as part of exercise prescription that the participant will acknowledge.  Activity Barriers & Risk Stratification: Activity Barriers & Cardiac Risk Stratification - 10/29/17 1516      Activity Barriers & Cardiac Risk Stratification   Activity Barriers  Arthritis;Back Problems;Deconditioning;Muscular Weakness   Bilateral knee arthritis    Cardiac Risk Stratification  High       6 Minute Walk: 6 Minute Walk    Row Name 10/29/17 1514         6 Minute Walk   Phase  Initial     Distance  491.5 feet     Walk Time  4.15 minutes     # of Rest Breaks  2     MPH  0.93     METS  1.86     RPE  13     VO2 Peak  6.52     Symptoms  Yes (comment)     Comments  Fatigue     Resting HR  96 bpm     Resting BP  110/60     Resting Oxygen Saturation   100 %     Exercise Oxygen Saturation  during 6 min walk  97 %     Max Ex.  HR  107 bpm     Max Ex. BP  118/70     2 Minute Post BP  122/72        Oxygen Initial Assessment:   Oxygen Re-Evaluation:   Oxygen Discharge (  Final Oxygen Re-Evaluation):   Initial Exercise Prescription: Initial Exercise Prescription - 10/29/17 1500      Date of Initial Exercise RX and Referring Provider   Date  10/29/17    Referring Provider  Dr. Peter Martinique, MD     Expected Discharge Date  02/05/18      Recumbant Bike   Level  1    Watts  5    Minutes  10    METs  2.2      NuStep   Level  1    SPM  75    Minutes  10    METs  1.9      Arm Ergometer   Level  2    Watts  15    Minutes  10    METs  2.05      Prescription Details   Frequency (times per week)  3    Duration  Progress to 30 minutes of continuous aerobic without signs/symptoms of physical distress      Intensity   THRR 40-80% of Max Heartrate  63-126    Ratings of Perceived Exertion  11-13    Perceived Dyspnea  0-4      Progression   Progression  Continue progressive overload as per policy without signs/symptoms or physical distress.      Resistance Training   Training Prescription  Yes    Weight  2    Reps  10-15       Perform Capillary Blood Glucose checks as needed.  Exercise Prescription Changes: Exercise Prescription Changes    Row Name 11/09/17 1453 12/02/17 1505 12/07/17 1430         Response to Exercise   Blood Pressure (Admit)  118/80  124/74  124/80     Blood Pressure (Exercise)  148/82  128/70  120/70     Blood Pressure (Exit)  140/80  124/70  128/84     Heart Rate (Admit)  94 bpm  66 bpm  84 bpm     Heart Rate (Exercise)  107 bpm  101 bpm  102 bpm     Heart Rate (Exit)  82 bpm  66 bpm  84 bpm     Rating of Perceived Exertion (Exercise)  '13  13  13     '$ Symptoms  none  none  none     Duration  Progress to 30 minutes of  aerobic without signs/symptoms of physical distress  Progress to 30 minutes of  aerobic without signs/symptoms of physical distress  Progress to 30  minutes of  aerobic without signs/symptoms of physical distress     Intensity  THRR unchanged  THRR unchanged  THRR unchanged       Progression   Progression  Continue to progress workloads to maintain intensity without signs/symptoms of physical distress.  Continue to progress workloads to maintain intensity without signs/symptoms of physical distress.  Continue to progress workloads to maintain intensity without signs/symptoms of physical distress.     Average METs  1.7  1.9  2.1       Resistance Training   Training Prescription  Yes  No Relaxation day, no weights.  Yes     Weight  2  -  2lbs     Reps  10-15  -  10-15     Time  10 Minutes  -  10 Minutes       Interval Training   Interval Training  No  No  No  Recumbant Bike   Level  1  1  1.5     Watts  5  5  -     Minutes  '10  10  10     '$ METs  1.4  1.5  1.7       NuStep   Level  '1  2  2     '$ SPM  75  85  85     Minutes  '10  10  10     '$ METs  1.9  2.3  2.4       Arm Ergometer   Level  '2  2  2     '$ Minutes  '10  10  10       '$ Home Exercise Plan   Plans to continue exercise at  -  Home (comment) walking  Home (comment) walking     Frequency  -  Add 1 additional day to program exercise sessions.  Add 1 additional day to program exercise sessions.     Initial Home Exercises Provided  -  11/18/17  11/18/17        Exercise Comments: Exercise Comments    Row Name 11/09/17 1545 11/18/17 1509 12/07/17 1430       Exercise Comments  Patient tolerated low intensity exercise well without c/o.  Reviewed home exercise guidelines with patient.  Reviewed METs and goals with patient.        Exercise Goals and Review: Exercise Goals    Row Name 10/29/17 1518             Exercise Goals   Increase Physical Activity  Yes       Intervention  Provide advice, education, support and counseling about physical activity/exercise needs.;Develop an individualized exercise prescription for aerobic and resistive training based on initial  evaluation findings, risk stratification, comorbidities and participant's personal goals.       Expected Outcomes  Short Term: Attend rehab on a regular basis to increase amount of physical activity.       Increase Strength and Stamina  Yes       Intervention  Provide advice, education, support and counseling about physical activity/exercise needs.;Develop an individualized exercise prescription for aerobic and resistive training based on initial evaluation findings, risk stratification, comorbidities and participant's personal goals.       Expected Outcomes  Short Term: Increase workloads from initial exercise prescription for resistance, speed, and METs.;Short Term: Perform resistance training exercises routinely during rehab and add in resistance training at home;Long Term: Improve cardiorespiratory fitness, muscular endurance and strength as measured by increased METs and functional capacity (6MWT)       Able to understand and use rate of perceived exertion (RPE) scale  Yes       Intervention  Provide education and explanation on how to use RPE scale       Expected Outcomes  Short Term: Able to use RPE daily in rehab to express subjective intensity level;Long Term:  Able to use RPE to guide intensity level when exercising independently       Knowledge and understanding of Target Heart Rate Range (THRR)  Yes       Intervention  Provide education and explanation of THRR including how the numbers were predicted and where they are located for reference       Expected Outcomes  Short Term: Able to state/look up THRR;Long Term: Able to use THRR to govern intensity when exercising independently;Short Term: Able to use daily as guideline for  intensity in rehab       Able to check pulse independently  Yes       Intervention  Provide education and demonstration on how to check pulse in carotid and radial arteries.;Review the importance of being able to check your own pulse for safety during independent  exercise       Expected Outcomes  Short Term: Able to explain why pulse checking is important during independent exercise;Long Term: Able to check pulse independently and accurately       Understanding of Exercise Prescription  Yes       Intervention  Provide education, explanation, and written materials on patient's individual exercise prescription       Expected Outcomes  Short Term: Able to explain program exercise prescription;Long Term: Able to explain home exercise prescription to exercise independently          Exercise Goals Re-Evaluation : Exercise Goals Re-Evaluation    Row Name 11/09/17 1545 11/18/17 1509 12/07/17 1449 12/08/17 1430       Exercise Goal Re-Evaluation   Exercise Goals Review  Increase Physical Activity;Able to understand and use rate of perceived exertion (RPE) scale  Increase Physical Activity;Able to understand and use rate of perceived exertion (RPE) scale;Knowledge and understanding of Target Heart Rate Range (THRR);Understanding of Exercise Prescription;Able to check pulse independently  Increase Physical Activity;Able to understand and use rate of perceived exertion (RPE) scale;Knowledge and understanding of Target Heart Rate Range (THRR);Understanding of Exercise Prescription;Able to check pulse independently  -    Comments  Patient able to understand and use RPE scale appropriately.  Reviewed home exercise guidelines with patient including THRR, RPE scale and endpoints for exercise. Pt plans to walk as her mode of home exercise.  Patient feels that her strength and stamina is getting better. Pt is walking 15 minutes at least 1 day/week in addition to exercise at cardiac rehab.  -    Expected Outcomes  Increase workloads as tolerated to help increase cardiorespiratory fitness.  Patient will accumulate 30 minutes of walking at home at least one day/week in addition to exercise at cardiac rehab.  Pt will increase walking duration at home and add an additional day as  tolerated.  -       Discharge Exercise Prescription (Final Exercise Prescription Changes): Exercise Prescription Changes - 12/07/17 1430      Response to Exercise   Blood Pressure (Admit)  124/80    Blood Pressure (Exercise)  120/70    Blood Pressure (Exit)  128/84    Heart Rate (Admit)  84 bpm    Heart Rate (Exercise)  102 bpm    Heart Rate (Exit)  84 bpm    Rating of Perceived Exertion (Exercise)  13    Symptoms  none    Duration  Progress to 30 minutes of  aerobic without signs/symptoms of physical distress    Intensity  THRR unchanged      Progression   Progression  Continue to progress workloads to maintain intensity without signs/symptoms of physical distress.    Average METs  2.1      Resistance Training   Training Prescription  Yes    Weight  2lbs    Reps  10-15    Time  10 Minutes      Interval Training   Interval Training  No      Recumbant Bike   Level  1.5    Minutes  10    METs  1.7  NuStep   Level  2    SPM  85    Minutes  10    METs  2.4      Arm Ergometer   Level  2    Minutes  10      Home Exercise Plan   Plans to continue exercise at  Home (comment)   walking   Frequency  Add 1 additional day to program exercise sessions.    Initial Home Exercises Provided  11/18/17       Nutrition:  Target Goals: Understanding of nutrition guidelines, daily intake of sodium '1500mg'$ , cholesterol '200mg'$ , calories 30% from fat and 7% or less from saturated fats, daily to have 5 or more servings of fruits and vegetables.  Biometrics: Pre Biometrics - 10/29/17 1519      Pre Biometrics   Height  5' 4.5" (1.638 m)    Weight  73 kg    Waist Circumference  44 inches    Hip Circumference  45 inches    Waist to Hip Ratio  0.98 %    BMI (Calculated)  27.21    Triceps Skinfold  16 mm    % Body Fat  38.6 %    Grip Strength  30 kg    Flexibility  14 in    Single Leg Stand  0 seconds        Nutrition Therapy Plan and Nutrition Goals: Nutrition  Therapy & Goals - 11/04/17 0925      Nutrition Therapy   Diet  heart healthy, carb modified       Nutrition Assessments: Nutrition Assessments - 11/04/17 0926      MEDFICTS Scores   Pre Score  30       Nutrition Goals Re-Evaluation:   Nutrition Goals Re-Evaluation:   Nutrition Goals Discharge (Final Nutrition Goals Re-Evaluation):   Psychosocial: Target Goals: Acknowledge presence or absence of significant depression and/or stress, maximize coping skills, provide positive support system. Participant is able to verbalize types and ability to use techniques and skills needed for reducing stress and depression.  Initial Review & Psychosocial Screening: Initial Psych Review & Screening - 10/29/17 1620      Initial Review   Current issues with  None Identified      Family Dynamics   Good Support System?  Yes   [place] has her grandchildren,sister and daughter for support     Barriers   Psychosocial barriers to participate in program  There are no identifiable barriers or psychosocial needs.      Screening Interventions   Interventions  Encouraged to exercise       Quality of Life Scores: Quality of Life - 10/29/17 1458      Quality of Life   Select  Quality of Life      Quality of Life Scores   Health/Function Pre  23.21 %    Socioeconomic Pre  22.86 %    Psych/Spiritual Pre  22.86 %    Family Pre  27.88 %    GLOBAL Pre  23.64 %      Scores of 19 and below usually indicate a poorer quality of life in these areas.  A difference of  2-3 points is a clinically meaningful difference.  A difference of 2-3 points in the total score of the Quality of Life Index has been associated with significant improvement in overall quality of life, self-image, physical symptoms, and general health in studies assessing change in quality of life.  PHQ-9: Recent Review  Flowsheet Data    Depression screen [facility] 2/9 11/10/2017 11/09/2017 11/04/2017 09/04/2017 06/12/2017   Decreased  Interest 0 0 0 0 0   Down, Depressed, Hopeless 0 0 0 0 0   PHQ - 2 Score 0 0 0 0 0     Interpretation of Total Score  Total Score Depression Severity:  1-4 = Minimal depression, 5-9 = Mild depression, 10-14 = Moderate depression, 15-19 = Moderately severe depression, 20-27 = Severe depression   Psychosocial Evaluation and Intervention:   Psychosocial Re-Evaluation: Psychosocial Re-Evaluation    [place] Name 11/19/17 1448 12/10/17 1334           Psychosocial Re-Evaluation   Current issues with  None Identified  None Identified      Interventions  Encouraged to attend Cardiac Rehabilitation for the exercise  Encouraged to attend Cardiac Rehabilitation for the exercise      Continue Psychosocial Services   No Follow up required  No Follow up required         Psychosocial Discharge (Final Psychosocial Re-Evaluation): Psychosocial Re-Evaluation - 12/10/17 1334      Psychosocial Re-Evaluation   Current issues with  None Identified    Interventions  Encouraged to attend Cardiac Rehabilitation for the exercise    Continue Psychosocial Services   No Follow up required       Vocational Rehabilitation: Provide vocational rehab assistance to qualifying candidates.   Vocational Rehab Evaluation & Intervention: Vocational Rehab - 10/29/17 1623      Initial Vocational Rehab Evaluation & Intervention   Assessment shows need for Vocational Rehabilitation  No       Education: Education Goals: Education classes will be provided on a weekly basis, covering required topics. Participant will state understanding/return demonstration of topics presented.  Learning Barriers/Preferences: Learning Barriers/Preferences - 10/29/17 1521      Learning Barriers/Preferences   Learning Barriers  None    Learning Preferences  Written Material       Education Topics: Count Your Pulse:  -Group instruction provided by verbal instruction, demonstration, patient participation and written materials  to support subject.  Instructors address importance of being able to find your pulse and how to count your pulse when at home without a heart monitor.  Patients get hands on experience counting their pulse with staff help and individually.   Heart Attack, Angina, and Risk Factor Modification:  -Group instruction provided by verbal instruction, video, and written materials to support subject.  Instructors address signs and symptoms of angina and heart attacks.    Also discuss risk factors for heart disease and how to make changes to improve heart health risk factors.   Functional Fitness:  -Group instruction provided by verbal instruction, demonstration, patient participation, and written materials to support subject.  Instructors address safety measures for doing things around the house.  Discuss how to get up and down off the floor, how to pick things up properly, how to safely get out of a chair without assistance, and balance training.   Meditation and Mindfulness:  -Group instruction provided by verbal instruction, patient participation, and written materials to support subject.  Instructor addresses importance of mindfulness and meditation practice to help reduce stress and improve awareness.  Instructor also leads participants through a meditation exercise.    Stretching for Flexibility and Mobility:  -Group instruction provided by verbal instruction, patient participation, and written materials to support subject.  Instructors lead participants through series of stretches that are designed to increase flexibility thus improving mobility.  These  stretches are additional exercise for major muscle groups that are typically performed during regular warm up and cool down.   Hands Only CPR:  -Group verbal, video, and participation provides a basic overview of AHA guidelines for community CPR. Role-play of emergencies allow participants the opportunity to practice calling for help and chest  compression technique with discussion of AED use.   Hypertension: -Group verbal and written instruction that provides a basic overview of hypertension including the most recent diagnostic guidelines, risk factor reduction with self-care instructions and medication management.    Nutrition I class: Heart Healthy Eating:  -Group instruction provided by PowerPoint slides, verbal discussion, and written materials to support subject matter. The instructor gives an explanation and review of the Therapeutic Lifestyle Changes diet recommendations, which includes a discussion on lipid goals, dietary fat, sodium, fiber, plant stanol/sterol esters, sugar, and the components of a well-balanced, healthy diet.   Nutrition II class: Lifestyle Skills:  -Group instruction provided by PowerPoint slides, verbal discussion, and written materials to support subject matter. The instructor gives an explanation and review of label reading, grocery shopping for heart health, heart healthy recipe modifications, and ways to make healthier choices when eating out.   Diabetes Question & Answer:  -Group instruction provided by PowerPoint slides, verbal discussion, and written materials to support subject matter. The instructor gives an explanation and review of diabetes co-morbidities, pre- and post-prandial blood glucose goals, pre-exercise blood glucose goals, signs, symptoms, and treatment of hypoglycemia and hyperglycemia, and foot care basics.   Diabetes Blitz:  -Group instruction provided by PowerPoint slides, verbal discussion, and written materials to support subject matter. The instructor gives an explanation and review of the physiology behind type 1 and type 2 diabetes, diabetes medications and rational behind using different medications, pre- and post-prandial blood glucose recommendations and Hemoglobin A1c goals, diabetes diet, and exercise including blood glucose guidelines for exercising safely.    Portion  Distortion:  -Group instruction provided by PowerPoint slides, verbal discussion, written materials, and food models to support subject matter. The instructor gives an explanation of serving size versus portion size, changes in portions sizes over the last 20 years, and what consists of a serving from each food group.   Stress Management:  -Group instruction provided by verbal instruction, video, and written materials to support subject matter.  Instructors review role of stress in heart disease and how to cope with stress positively.     Exercising on Your Own:  -Group instruction provided by verbal instruction, power point, and written materials to support subject.  Instructors discuss benefits of exercise, components of exercise, frequency and intensity of exercise, and end points for exercise.  Also discuss use of nitroglycerin and activating EMS.  Review options of places to exercise outside of rehab.  Review guidelines for sex with heart disease.   Cardiac Drugs I:  -Group instruction provided by verbal instruction and written materials to support subject.  Instructor reviews cardiac drug classes: antiplatelets, anticoagulants, beta blockers, and statins.  Instructor discusses reasons, side effects, and lifestyle considerations for each drug class.   Cardiac Drugs II:  -Group instruction provided by verbal instruction and written materials to support subject.  Instructor reviews cardiac drug classes: angiotensin converting enzyme inhibitors (ACE-I), angiotensin II receptor blockers (ARBs), nitrates, and calcium channel blockers.  Instructor discusses reasons, side effects, and lifestyle considerations for each drug class.   Anatomy and Physiology of the Circulatory System:  Group verbal and written instruction and models provide basic cardiac anatomy and  physiology, with the coronary electrical and arterial systems. Review of: AMI, Angina, Valve disease, Heart Failure, Peripheral Artery  Disease, Cardiac Arrhythmia, Pacemakers, and the ICD.   Other Education:  -Group or individual verbal, written, or video instructions that support the educational goals of the cardiac rehab program.   Holiday Eating Survival Tips:  -Group instruction provided by PowerPoint slides, verbal discussion, and written materials to support subject matter. The instructor gives patients tips, tricks, and techniques to help them not only survive but enjoy the holidays despite the onslaught of food that accompanies the holidays.   Knowledge Questionnaire Score: Knowledge Questionnaire Score - 10/29/17 1524      Knowledge Questionnaire Score   Pre Score  21/24       Core Components/Risk Factors/Patient Goals at Admission: Personal Goals and Risk Factors at Admission - 10/29/17 1522      Core Components/Risk Factors/Patient Goals on Admission    Weight Management  Weight Loss;Yes    Intervention  Weight Management: Develop a combined nutrition and exercise program designed to reach desired caloric intake, while maintaining appropriate intake of nutrient and fiber, sodium and fats, and appropriate energy expenditure required for the weight goal.;Weight Management: Provide education and appropriate resources to help participant work on and attain dietary goals.;Weight Management/Obesity: Establish reasonable short term and long term weight goals.    Admit Weight  160 lb 15 oz (73 kg)    Goal Weight: Long Term  140 lb (63.5 kg)    Expected Outcomes  Short Term: Continue to assess and modify interventions until short term weight is achieved;Long Term: Adherence to nutrition and physical activity/exercise program aimed toward attainment of established weight goal;Weight Loss: Understanding of general recommendations for a balanced deficit meal plan, which promotes 1-2 lb weight loss per week and includes a negative energy balance of [phone] kcal/d;Understanding recommendations for meals to include 15-35%  energy as protein, 25-35% energy from fat, 35-60% energy from carbohydrates, less than '200mg'$  of dietary cholesterol, 20-35 gm of total fiber daily;Understanding of distribution of calorie intake throughout the day with the consumption of 4-5 meals/snacks    Diabetes  Yes    Intervention  Provide education about signs/symptoms and action to take for hypo/hyperglycemia.;Provide education about proper nutrition, including hydration, and aerobic/resistive exercise prescription along with prescribed medications to achieve blood glucose in normal ranges: Fasting glucose 65-99 mg/dL    Expected Outcomes  Short Term: Participant verbalizes understanding of the signs/symptoms and immediate care of hyper/hypoglycemia, proper foot care and importance of medication, aerobic/resistive exercise and nutrition plan for blood glucose control.;Long Term: Attainment of HbA1C < 7%.    Hypertension  Yes    Intervention  Provide education on lifestyle modifcations including regular physical activity/exercise, weight management, moderate sodium restriction and increased consumption of fresh fruit, vegetables, and low fat dairy, alcohol moderation, and smoking cessation.;Monitor prescription use compliance.    Expected Outcomes  Short Term: Continued assessment and intervention until BP is < 140/28m HG in hypertensive participants. < 130/856mHG in hypertensive participants with diabetes, heart failure or chronic kidney disease.;Long Term: Maintenance of blood pressure at goal levels.    Lipids  Yes    Intervention  Provide education and support for participant on nutrition & aerobic/resistive exercise along with prescribed medications to achieve LDL '70mg'$ , HDL >'40mg'$ .    Expected Outcomes  Short Term: Participant states understanding of desired cholesterol values and is compliant with medications prescribed. Participant is following exercise prescription and nutrition guidelines.;Long Term: Cholesterol controlled with medications  as prescribed, with individualized exercise RX and with personalized nutrition plan. Value goals: LDL < '70mg'$ , HDL > 40 mg.       Core Components/Risk Factors/Patient Goals Review:  Goals and Risk Factor Review    Row Name 11/19/17 1452 12/10/17 1334           Core Components/Risk Factors/Patient Goals Review   Personal Goals Review  Weight Management/Obesity;Diabetes;Hypertension;Lipids  Weight Management/Obesity;Diabetes;Hypertension;Lipids      Review  Luba's vital signs and self reported CBg's have been in the 200's. Synai is doing well with exercise and enjoying partcipating in phase 2 cardiac rehab. Macil continues to use her rolling walker for stability.  Azalee's vital signs and self reported CBg's have been in the 200's. Channie is doing well with exercise and enjoying partcipating in phase 2 cardiac rehab. Azelia continues to use her rolling walker for stability.      Expected Outcomes  Patient will participate in phase 2 cardiac rehab for exercise. Follow nutrtion and lifestyle opportunities.  Patient will participate in phase 2 cardiac rehab for exercise. Follow nutrtion and lifestyle opportunities.         Core Components/Risk Factors/Patient Goals at Discharge (Final Review):  Goals and Risk Factor Review - 12/10/17 1334      Core Components/Risk Factors/Patient Goals Review   Personal Goals Review  Weight Management/Obesity;Diabetes;Hypertension;Lipids    Review  Krislyn's vital signs and self reported CBg's have been in the 200's. Susette is doing well with exercise and enjoying partcipating in phase 2 cardiac rehab. Beulah continues to use her rolling walker for stability.    Expected Outcomes  Patient will participate in phase 2 cardiac rehab for exercise. Follow nutrtion and lifestyle opportunities.       ITP Comments: ITP Comments    Row Name 10/29/17 1513 11/19/17 1446 12/10/17 1334       ITP Comments  Dr. Fransico Him, Medical Director   30 Day ITP Review. Patient with good  partcipation and attendance in phase 2 cardiac rehab.  30 Day ITP Review. Patient with good partcipation and attendance in phase 2 cardiac rehab.        Comments: See ITP comments.Barnet Pall, RN,BSN 12/10/2017 1:39 PM

## 2017-12-11 ENCOUNTER — Encounter (HOSPITAL_COMMUNITY): Payer: Medicaid Other

## 2017-12-14 ENCOUNTER — Encounter (HOSPITAL_COMMUNITY): Payer: Medicaid Other

## 2017-12-14 ENCOUNTER — Telehealth (HOSPITAL_COMMUNITY): Payer: Self-pay | Admitting: Family Medicine

## 2017-12-16 ENCOUNTER — Encounter (HOSPITAL_COMMUNITY): Payer: Medicaid Other

## 2017-12-16 ENCOUNTER — Institutional Professional Consult (permissible substitution): Payer: Medicaid Other | Admitting: Neurology

## 2017-12-18 ENCOUNTER — Encounter (HOSPITAL_COMMUNITY): Payer: Medicaid Other

## 2017-12-21 ENCOUNTER — Encounter (HOSPITAL_COMMUNITY): Payer: Medicaid Other

## 2017-12-23 ENCOUNTER — Encounter (HOSPITAL_COMMUNITY)
Admission: RE | Admit: 2017-12-23 | Discharge: 2017-12-23 | Disposition: A | Discharge status: Home / Self Care | Payer: Medicaid Other | Source: Ambulatory Visit | Attending: Cardiology | Admitting: Cardiology

## 2017-12-23 ENCOUNTER — Encounter (HOSPITAL_COMMUNITY): Payer: Medicaid Other

## 2017-12-23 DIAGNOSIS — Z87891 Personal history of nicotine dependence: Secondary | ICD-10-CM | POA: Diagnosis not present

## 2017-12-23 DIAGNOSIS — Z794 Long term (current) use of insulin: Secondary | ICD-10-CM | POA: Insufficient documentation

## 2017-12-23 DIAGNOSIS — Z8673 Personal history of transient ischemic attack (TIA), and cerebral infarction without residual deficits: Secondary | ICD-10-CM | POA: Insufficient documentation

## 2017-12-23 DIAGNOSIS — Z7902 Long term (current) use of antithrombotics/antiplatelets: Secondary | ICD-10-CM | POA: Insufficient documentation

## 2017-12-23 DIAGNOSIS — E785 Hyperlipidemia, unspecified: Secondary | ICD-10-CM | POA: Diagnosis not present

## 2017-12-23 DIAGNOSIS — Z955 Presence of coronary angioplasty implant and graft: Secondary | ICD-10-CM | POA: Diagnosis not present

## 2017-12-23 DIAGNOSIS — M1990 Unspecified osteoarthritis, unspecified site: Secondary | ICD-10-CM | POA: Insufficient documentation

## 2017-12-23 DIAGNOSIS — I252 Old myocardial infarction: Secondary | ICD-10-CM | POA: Diagnosis present

## 2017-12-23 DIAGNOSIS — Z7982 Long term (current) use of aspirin: Secondary | ICD-10-CM | POA: Diagnosis not present

## 2017-12-23 DIAGNOSIS — I214 Non-ST elevation (NSTEMI) myocardial infarction: Secondary | ICD-10-CM

## 2017-12-23 DIAGNOSIS — E119 Type 2 diabetes mellitus without complications: Secondary | ICD-10-CM | POA: Insufficient documentation

## 2017-12-23 DIAGNOSIS — I10 Essential (primary) hypertension: Secondary | ICD-10-CM | POA: Diagnosis not present

## 2017-12-23 DIAGNOSIS — G4730 Sleep apnea, unspecified: Secondary | ICD-10-CM | POA: Insufficient documentation

## 2017-12-23 DIAGNOSIS — Z79899 Other long term (current) drug therapy: Secondary | ICD-10-CM | POA: Insufficient documentation

## 2017-12-25 ENCOUNTER — Encounter (HOSPITAL_COMMUNITY)
Admission: RE | Admit: 2017-12-25 | Discharge: 2017-12-25 | Disposition: A | Discharge status: Home / Self Care | Payer: Medicaid Other | Source: Ambulatory Visit | Attending: Cardiology | Admitting: Cardiology

## 2017-12-25 ENCOUNTER — Encounter (HOSPITAL_COMMUNITY): Payer: Medicaid Other

## 2017-12-25 DIAGNOSIS — I214 Non-ST elevation (NSTEMI) myocardial infarction: Secondary | ICD-10-CM

## 2017-12-25 DIAGNOSIS — Z955 Presence of coronary angioplasty implant and graft: Secondary | ICD-10-CM

## 2017-12-25 DIAGNOSIS — I252 Old myocardial infarction: Secondary | ICD-10-CM | POA: Diagnosis not present

## 2017-12-27 ENCOUNTER — Ambulatory Visit (INDEPENDENT_AMBULATORY_CARE_PROVIDER_SITE_OTHER): Payer: Medicaid Other | Admitting: Neurology

## 2017-12-27 DIAGNOSIS — I63532 Cerebral infarction due to unspecified occlusion or stenosis of left posterior cerebral artery: Secondary | ICD-10-CM

## 2017-12-27 DIAGNOSIS — G4719 Other hypersomnia: Secondary | ICD-10-CM

## 2017-12-27 DIAGNOSIS — R0683 Snoring: Secondary | ICD-10-CM

## 2017-12-27 DIAGNOSIS — I10 Essential (primary) hypertension: Secondary | ICD-10-CM

## 2017-12-27 DIAGNOSIS — G4733 Obstructive sleep apnea (adult) (pediatric): Secondary | ICD-10-CM | POA: Diagnosis not present

## 2017-12-27 DIAGNOSIS — E1165 Type 2 diabetes mellitus with hyperglycemia: Secondary | ICD-10-CM

## 2017-12-28 ENCOUNTER — Encounter (HOSPITAL_COMMUNITY): Payer: Medicaid Other

## 2017-12-28 ENCOUNTER — Encounter: Payer: Medicaid Other | Admitting: Family Medicine

## 2017-12-30 ENCOUNTER — Encounter (HOSPITAL_COMMUNITY)
Admission: RE | Admit: 2017-12-30 | Discharge: 2017-12-30 | Disposition: A | Discharge status: Home / Self Care | Payer: Medicaid Other | Source: Ambulatory Visit | Attending: Cardiology | Admitting: Cardiology

## 2017-12-30 ENCOUNTER — Encounter (HOSPITAL_COMMUNITY): Payer: Medicaid Other

## 2017-12-30 DIAGNOSIS — I252 Old myocardial infarction: Secondary | ICD-10-CM | POA: Diagnosis not present

## 2017-12-30 DIAGNOSIS — Z955 Presence of coronary angioplasty implant and graft: Secondary | ICD-10-CM

## 2017-12-30 DIAGNOSIS — I214 Non-ST elevation (NSTEMI) myocardial infarction: Secondary | ICD-10-CM

## 2017-12-31 NOTE — Progress Notes (Signed)
Cardiac Individual Treatment Plan  Patient Details  Name: Holly Acosta MRN: [id number] Date of Birth: 09/23/1955 Referring Provider:     CARDIAC REHAB PHASE II ORIENTATION from 10/29/2017 in [place]  Referring Provider  Dr. Peter Martinique, MD       Initial Encounter Date:    CARDIAC REHAB PHASE II ORIENTATION from 10/29/2017 in [place]  Date  10/29/17      Visit Diagnosis: NSTEMI (non-ST elevated myocardial infarction) [facility])  Status post coronary artery stent placement, DES CFX, 04/24/17   Patient's Home Medications on Admission:  Current Outpatient Medications:  .  ACCU-CHEK FASTCLIX LANCETS MISC, 1 each by Does not apply route daily. Check sugar daily as needed, Disp: 102 each, Rfl: 3 .  acetaminophen (TYLENOL) 500 MG tablet, Take 1,000 mg by mouth every 12 (twelve) hours as needed for mild pain. , Disp: , Rfl:  .  amLODipine (NORVASC) 5 MG tablet, Take 1 tablet (5 mg total) by mouth daily., Disp: 90 tablet, Rfl: 3 .  aspirin EC 81 MG tablet, Take 1 tablet (81 mg total) by mouth daily., Disp: 90 tablet, Rfl: 3 .  B-D UF III MINI PEN NEEDLES 31G X 5 MM MISC, USE AS DIRECTED 3 TIMES A DAY, Disp: 100 each, Rfl: 1 .  Blood Glucose Monitoring Suppl (ACCU-CHEK NANO SMARTVIEW) w/Device KIT, Use to test blood sugar up to 3 times a day. ICD-10 code: E11.9, Disp: 1 kit, Rfl: 0 .  carvedilol (COREG) 12.5 MG tablet, Take 1 tablet (12.5 mg total) by mouth 2 (two) times daily with a meal., Disp: 180 tablet, Rfl: 3 .  glucose blood (ACCU-CHEK AVIVA) test strip, Use as instructed, Disp: 100 each, Rfl: 12 .  glucose blood (ACCU-CHEK SMARTVIEW) test strip, Use as instructed, Disp: 100 each, Rfl: 12 .  insulin aspart (NOVOLOG) 100 UNIT/ML injection, Inject 2-8 Units into the skin See admin instructions. Sliding scale: 201-250: 2 units; 251-300: 4 units; 301-350: 6 units; 351-400: 8 units., Disp: , Rfl:  .  Insulin Glargine (LANTUS  SOLOSTAR) 100 UNIT/ML Solostar Pen, INJECT 32 units into skin dialy, Disp: 45 pen, Rfl: 3 .  lisinopril (PRINIVIL,ZESTRIL) 20 MG tablet, Take 1 tablet (20 mg total) by mouth daily., Disp: 90 tablet, Rfl: 3 .  metFORMIN (GLUCOPHAGE) 1000 MG tablet, TAKE 1 TABLET BY MOUTH TWICE DAILY WITH A MEAL Resume on 04/30/17., Disp: 60 tablet, Rfl: 2 .  NOVOLOG FLEXPEN 100 UNIT/ML FlexPen, INJECT 20 UNITS INTO THE SKIN 3 TIMES DAILY WITH MEALS. (Patient taking differently: Inject subcutaneously as directed per sliding scale. Sliding scale: 201-250: 2 units; 251-300: 4 units; 301-350: 6 units; 351-400: 8 units.), Disp: 15 pen, Rfl: 2 .  rosuvastatin (CRESTOR) 40 MG tablet, Take 1 tablet (40 mg total) by mouth daily., Disp: 90 tablet, Rfl: 3 .  ticagrelor (BRILINTA) 90 MG TABS tablet, Take 1 tablet (90 mg total) by mouth 2 (two) times daily., Disp: 180 tablet, Rfl: 3  Past Medical History: Past Medical History:  Diagnosis Date  . Acute cerebrovascular accident (CVA) of cerebellum ([place]) 04/29/2017  . Anemia   . Arthritis of knee    "both knees" (04/24/2017)  . Coronary artery disease involving native coronary artery of native heart with unstable angina pectoris ([place]) 04/30/2017  . Diabetes mellitus, type 2 ([place])   . History of blood transfusion    "related to prolapsed uterus"  . HLD (hyperlipidemia)   . HTN (hypertension)   . Non-ST  elevation (NSTEMI) myocardial infarction ([place])   . Prolapsed uterus   . Sleep apnea    "have mask; haven't worn it for a long time" (04/24/2017)  . Status post coronary artery stent placement     Tobacco Use: Social History   Tobacco Use  Smoking Status Former Smoker  . Packs/day: 1.00  . Years: 33.00  . Pack years: 33.00  . Types: Cigarettes  . Start date: 01/20/1973  . Last attempt to quit: 11/04/2006  . Years since quitting: 11.1  Smokeless Tobacco Never Used    Labs: Recent Chemical engineer    Labs for ITP Cardiac and Pulmonary Rehab Latest Ref Rng & Units  07/31/2016 04/25/2017 04/28/2017 04/29/2017 09/04/2017   Cholestrol 0 - 200 mg/dL - 230(H) - 173 -   LDLCALC 0 - 99 mg/dL - UNABLE TO CALCULATE IF TRIGLYCERIDE OVER 400 mg/dL - 107(H) -   LDLDIRECT mg/dL - - - - -   HDL >40 mg/dL - 30(L) - 34(L) -   Trlycerides <150 mg/dL - 518(H) - 159(H) -   Hemoglobin A1c 0.0 - 7.0 % 13.7 - - 13.3(H) 9.3(A)   PHART 7.350 - 7.450 - - 7.477(H) - -   PCO2ART 32.0 - 48.0 mmHg - - 34.9 - -   HCO3 20.0 - 28.0 mmol/L - - 25.7 - -   O2SAT % - - 89.2 - -      Capillary Blood Glucose: Lab Results  Component Value Date   GLUCAP 140 (H) 11/09/2017   GLUCAP 160 (H) 11/09/2017   GLUCAP 348 (H) 11/04/2017   GLUCAP 97 05/14/2017   GLUCAP 175 (H) 05/04/2017     Exercise Target Goals: Exercise Program Goal: Individual exercise prescription set using results from initial 6 min walk test and THRR while considering  patient's activity barriers and safety.   Exercise Prescription Goal: Initial exercise prescription builds to 30-45 minutes a day of aerobic activity, 2-3 days per week.  Home exercise guidelines will be given to patient during program as part of exercise prescription that the participant will acknowledge.  Activity Barriers & Risk Stratification: Activity Barriers & Cardiac Risk Stratification - 10/29/17 1516      Activity Barriers & Cardiac Risk Stratification   Activity Barriers  Arthritis;Back Problems;Deconditioning;Muscular Weakness   Bilateral knee arthritis    Cardiac Risk Stratification  High       6 Minute Walk: 6 Minute Walk    Row Name 10/29/17 1514         6 Minute Walk   Phase  Initial     Distance  491.5 feet     Walk Time  4.15 minutes     # of Rest Breaks  2     MPH  0.93     METS  1.86     RPE  13     VO2 Peak  6.52     Symptoms  Yes (comment)     Comments  Fatigue     Resting HR  96 bpm     Resting BP  110/60     Resting Oxygen Saturation   100 %     Exercise Oxygen Saturation  during 6 min walk  97 %     Max Ex.  HR  107 bpm     Max Ex. BP  118/70     2 Minute Post BP  122/72        Oxygen Initial Assessment:   Oxygen Re-Evaluation:   Oxygen Discharge (  Final Oxygen Re-Evaluation):   Initial Exercise Prescription: Initial Exercise Prescription - 10/29/17 1500      Date of Initial Exercise RX and Referring Provider   Date  10/29/17    Referring Provider  Dr. Peter Martinique, MD     Expected Discharge Date  02/05/18      Recumbant Bike   Level  1    Watts  5    Minutes  10    METs  2.2      NuStep   Level  1    SPM  75    Minutes  10    METs  1.9      Arm Ergometer   Level  2    Watts  15    Minutes  10    METs  2.05      Prescription Details   Frequency (times per week)  3    Duration  Progress to 30 minutes of continuous aerobic without signs/symptoms of physical distress      Intensity   THRR 40-80% of Max Heartrate  63-126    Ratings of Perceived Exertion  11-13    Perceived Dyspnea  0-4      Progression   Progression  Continue progressive overload as per policy without signs/symptoms or physical distress.      Resistance Training   Training Prescription  Yes    Weight  2    Reps  10-15       Perform Capillary Blood Glucose checks as needed.  Exercise Prescription Changes: Exercise Prescription Changes    Row Name 11/09/17 1453 12/02/17 1505 12/07/17 1430 12/23/17 1507       Response to Exercise   Blood Pressure (Admit)  118/80  124/74  124/80  138/75    Blood Pressure (Exercise)  148/82  128/70  120/70  148/80    Blood Pressure (Exit)  140/80  124/70  128/84  140/82    Heart Rate (Admit)  94 bpm  66 bpm  84 bpm  79 bpm    Heart Rate (Exercise)  107 bpm  101 bpm  102 bpm  98 bpm    Heart Rate (Exit)  82 bpm  66 bpm  84 bpm  72 bpm    Rating of Perceived Exertion (Exercise)  '13  13  13  11    '$ Symptoms  none  none  none  none    Duration  Progress to 30 minutes of  aerobic without signs/symptoms of physical distress  Progress to 30 minutes of  aerobic  without signs/symptoms of physical distress  Progress to 30 minutes of  aerobic without signs/symptoms of physical distress  Progress to 30 minutes of  aerobic without signs/symptoms of physical distress    Intensity  THRR unchanged  THRR unchanged  THRR unchanged  THRR unchanged      Progression   Progression  Continue to progress workloads to maintain intensity without signs/symptoms of physical distress.  Continue to progress workloads to maintain intensity without signs/symptoms of physical distress.  Continue to progress workloads to maintain intensity without signs/symptoms of physical distress.  Continue to progress workloads to maintain intensity without signs/symptoms of physical distress.    Average METs  1.7  1.9  2.1  2.5      Resistance Training   Training Prescription  Yes  No Relaxation day, no weights.  Yes  No Relaxation day, no weights    Weight  2  -  2lbs  -  Reps  10-15  -  10-15  -    Time  10 Minutes  -  10 Minutes  -      Interval Training   Interval Training  No  No  No  No      Recumbant Bike   Level  1  1  1.5  2    Watts  5  5  -  -    Minutes  '10  10  10  10    '$ METs  1.4  1.5  1.7  2.1      NuStep   Level  '1  2  2  3    '$ SPM  75  85  85  85    Minutes  '10  10  10  10    '$ METs  1.9  2.3  2.4  2.9      Arm Ergometer   Level  '2  2  2  3    '$ Minutes  '10  10  10  10      '$ Home Exercise Plan   Plans to continue exercise at  -  Home (comment) walking  Home (comment) walking  Home (comment) walking    Frequency  -  Add 1 additional day to program exercise sessions.  Add 1 additional day to program exercise sessions.  Add 1 additional day to program exercise sessions.    Initial Home Exercises Provided  -  11/18/17  11/18/17  11/18/17       Exercise Comments: Exercise Comments    Row Name 11/09/17 1545 11/18/17 1509 12/07/17 1430 12/23/17 1542     Exercise Comments  Patient tolerated low intensity exercise well without c/o.  Reviewed home exercise  guidelines with patient.  Reviewed METs and goals with patient.  Reviewed METs with patient.       Exercise Goals and Review: Exercise Goals    Row Name 10/29/17 1518             Exercise Goals   Increase Physical Activity  Yes       Intervention  Provide advice, education, support and counseling about physical activity/exercise needs.;Develop an individualized exercise prescription for aerobic and resistive training based on initial evaluation findings, risk stratification, comorbidities and participant's personal goals.       Expected Outcomes  Short Term: Attend rehab on a regular basis to increase amount of physical activity.       Increase Strength and Stamina  Yes       Intervention  Provide advice, education, support and counseling about physical activity/exercise needs.;Develop an individualized exercise prescription for aerobic and resistive training based on initial evaluation findings, risk stratification, comorbidities and participant's personal goals.       Expected Outcomes  Short Term: Increase workloads from initial exercise prescription for resistance, speed, and METs.;Short Term: Perform resistance training exercises routinely during rehab and add in resistance training at home;Long Term: Improve cardiorespiratory fitness, muscular endurance and strength as measured by increased METs and functional capacity (6MWT)       Able to understand and use rate of perceived exertion (RPE) scale  Yes       Intervention  Provide education and explanation on how to use RPE scale       Expected Outcomes  Short Term: Able to use RPE daily in rehab to express subjective intensity level;Long Term:  Able to use RPE to guide intensity level when exercising independently  Knowledge and understanding of Target Heart Rate Range (THRR)  Yes       Intervention  Provide education and explanation of THRR including how the numbers were predicted and where they are located for reference        Expected Outcomes  Short Term: Able to state/look up THRR;Long Term: Able to use THRR to govern intensity when exercising independently;Short Term: Able to use daily as guideline for intensity in rehab       Able to check pulse independently  Yes       Intervention  Provide education and demonstration on how to check pulse in carotid and radial arteries.;Review the importance of being able to check your own pulse for safety during independent exercise       Expected Outcomes  Short Term: Able to explain why pulse checking is important during independent exercise;Long Term: Able to check pulse independently and accurately       Understanding of Exercise Prescription  Yes       Intervention  Provide education, explanation, and written materials on patient's individual exercise prescription       Expected Outcomes  Short Term: Able to explain program exercise prescription;Long Term: Able to explain home exercise prescription to exercise independently          Exercise Goals Re-Evaluation : Exercise Goals Re-Evaluation    Row Name 11/09/17 1545 11/18/17 1509 12/07/17 1449 12/08/17 1430       Exercise Goal Re-Evaluation   Exercise Goals Review  Increase Physical Activity;Able to understand and use rate of perceived exertion (RPE) scale  Increase Physical Activity;Able to understand and use rate of perceived exertion (RPE) scale;Knowledge and understanding of Target Heart Rate Range (THRR);Understanding of Exercise Prescription;Able to check pulse independently  Increase Physical Activity;Able to understand and use rate of perceived exertion (RPE) scale;Knowledge and understanding of Target Heart Rate Range (THRR);Understanding of Exercise Prescription;Able to check pulse independently  -    Comments  Patient able to understand and use RPE scale appropriately.  Reviewed home exercise guidelines with patient including THRR, RPE scale and endpoints for exercise. Pt plans to walk as her mode of home  exercise.  Patient feels that her strength and stamina is getting better. Pt is walking 15 minutes at least 1 day/week in addition to exercise at cardiac rehab.  -    Expected Outcomes  Increase workloads as tolerated to help increase cardiorespiratory fitness.  Patient will accumulate 30 minutes of walking at home at least one day/week in addition to exercise at cardiac rehab.  Pt will increase walking duration at home and add an additional day as tolerated.  -       Discharge Exercise Prescription (Final Exercise Prescription Changes): Exercise Prescription Changes - 12/23/17 1507      Response to Exercise   Blood Pressure (Admit)  138/75    Blood Pressure (Exercise)  148/80    Blood Pressure (Exit)  140/82    Heart Rate (Admit)  79 bpm    Heart Rate (Exercise)  98 bpm    Heart Rate (Exit)  72 bpm    Rating of Perceived Exertion (Exercise)  11    Symptoms  none    Duration  Progress to 30 minutes of  aerobic without signs/symptoms of physical distress    Intensity  THRR unchanged      Progression   Progression  Continue to progress workloads to maintain intensity without signs/symptoms of physical distress.    Average METs  2.5  Resistance Training   Training Prescription  No   Relaxation day, no weights     Interval Training   Interval Training  No      Recumbant Bike   Level  2    Minutes  10    METs  2.1      NuStep   Level  3    SPM  85    Minutes  10    METs  2.9      Arm Ergometer   Level  3    Minutes  10      Home Exercise Plan   Plans to continue exercise at  Home (comment)   walking   Frequency  Add 1 additional day to program exercise sessions.    Initial Home Exercises Provided  11/18/17       Nutrition:  Target Goals: Understanding of nutrition guidelines, daily intake of sodium '1500mg'$ , cholesterol '200mg'$ , calories 30% from fat and 7% or less from saturated fats, daily to have 5 or more servings of fruits and vegetables.  Biometrics: Pre  Biometrics - 10/29/17 1519      Pre Biometrics   Height  5' 4.5" (1.638 m)    Weight  73 kg    Waist Circumference  44 inches    Hip Circumference  45 inches    Waist to Hip Ratio  0.98 %    BMI (Calculated)  27.21    Triceps Skinfold  16 mm    % Body Fat  38.6 %    Grip Strength  30 kg    Flexibility  14 in    Single Leg Stand  0 seconds        Nutrition Therapy Plan and Nutrition Goals: Nutrition Therapy & Goals - 11/04/17 0925      Nutrition Therapy   Diet  heart healthy, carb modified       Nutrition Assessments: Nutrition Assessments - 11/04/17 0926      MEDFICTS Scores   Pre Score  30       Nutrition Goals Re-Evaluation:   Nutrition Goals Re-Evaluation:   Nutrition Goals Discharge (Final Nutrition Goals Re-Evaluation):   Psychosocial: Target Goals: Acknowledge presence or absence of significant depression and/or stress, maximize coping skills, provide positive support system. Participant is able to verbalize types and ability to use techniques and skills needed for reducing stress and depression.  Initial Review & Psychosocial Screening: Initial Psych Review & Screening - 10/29/17 1620      Initial Review   Current issues with  None Identified      Family Dynamics   Good Support System?  Yes   Holly Acosta has her grandchildren,sister and daughter for support     Barriers   Psychosocial barriers to participate in program  There are no identifiable barriers or psychosocial needs.      Screening Interventions   Interventions  Encouraged to exercise       Quality of Life Scores: Quality of Life - 10/29/17 1458      Quality of Life   Select  Quality of Life      Quality of Life Scores   Health/Function Pre  23.21 %    Socioeconomic Pre  22.86 %    Psych/Spiritual Pre  22.86 %    Family Pre  27.88 %    GLOBAL Pre  23.64 %      Scores of 19 and below usually indicate a poorer quality of life in these areas.  A  difference of  2-3 points is a  clinically meaningful difference.  A difference of 2-3 points in the total score of the Quality of Life Index has been associated with significant improvement in overall quality of life, self-image, physical symptoms, and general health in studies assessing change in quality of life.  PHQ-9: Recent Review Flowsheet Data    Depression screen [facility] 2/9 11/10/2017 11/09/2017 11/04/2017 09/04/2017 06/12/2017   Decreased Interest 0 0 0 0 0   Down, Depressed, Hopeless 0 0 0 0 0   PHQ - 2 Score 0 0 0 0 0     Interpretation of Total Score  Total Score Depression Severity:  1-4 = Minimal depression, 5-9 = Mild depression, 10-14 = Moderate depression, 15-19 = Moderately severe depression, 20-27 = Severe depression   Psychosocial Evaluation and Intervention:   Psychosocial Re-Evaluation: Psychosocial Re-Evaluation    Row Name 11/19/17 1448 12/10/17 1334 12/31/17 1537         Psychosocial Re-Evaluation   Current issues with  None Identified  None Identified  None Identified     Interventions  Encouraged to attend Cardiac Rehabilitation for the exercise  Encouraged to attend Cardiac Rehabilitation for the exercise  Encouraged to attend Cardiac Rehabilitation for the exercise     Continue Psychosocial Services   No Follow up required  No Follow up required  No Follow up required        Psychosocial Discharge (Final Psychosocial Re-Evaluation): Psychosocial Re-Evaluation - 12/31/17 1537      Psychosocial Re-Evaluation   Current issues with  None Identified    Interventions  Encouraged to attend Cardiac Rehabilitation for the exercise    Continue Psychosocial Services   No Follow up required       Vocational Rehabilitation: Provide vocational rehab assistance to qualifying candidates.   Vocational Rehab Evaluation & Intervention: Vocational Rehab - 10/29/17 1623      Initial Vocational Rehab Evaluation & Intervention   Assessment shows need for Vocational Rehabilitation  No        Education: Education Goals: Education classes will be provided on a weekly basis, covering required topics. Participant will state understanding/return demonstration of topics presented.  Learning Barriers/Preferences: Learning Barriers/Preferences - 10/29/17 1521      Learning Barriers/Preferences   Learning Barriers  None    Learning Preferences  Written Material       Education Topics: Count Your Pulse:  -Group instruction provided by verbal instruction, demonstration, patient participation and written materials to support subject.  Instructors address importance of being able to find your pulse and how to count your pulse when at home without a heart monitor.  Patients get hands on experience counting their pulse with staff help and individually.   Heart Attack, Angina, and Risk Factor Modification:  -Group instruction provided by verbal instruction, video, and written materials to support subject.  Instructors address signs and symptoms of angina and heart attacks.    Also discuss risk factors for heart disease and how to make changes to improve heart health risk factors.   Functional Fitness:  -Group instruction provided by verbal instruction, demonstration, patient participation, and written materials to support subject.  Instructors address safety measures for doing things around the house.  Discuss how to get up and down off the floor, how to pick things up properly, how to safely get out of a chair without assistance, and balance training.   Meditation and Mindfulness:  -Group instruction provided by verbal instruction, patient participation, and written materials to  support subject.  Instructor addresses importance of mindfulness and meditation practice to help reduce stress and improve awareness.  Instructor also leads participants through a meditation exercise.    Stretching for Flexibility and Mobility:  -Group instruction provided by verbal instruction, patient  participation, and written materials to support subject.  Instructors lead participants through series of stretches that are designed to increase flexibility thus improving mobility.  These stretches are additional exercise for major muscle groups that are typically performed during regular warm up and cool down.   Hands Only CPR:  -Group verbal, video, and participation provides a basic overview of AHA guidelines for community CPR. Role-play of emergencies allow participants the opportunity to practice calling for help and chest compression technique with discussion of AED use.   Hypertension: -Group verbal and written instruction that provides a basic overview of hypertension including the most recent diagnostic guidelines, risk factor reduction with self-care instructions and medication management.    Nutrition I class: Heart Healthy Eating:  -Group instruction provided by PowerPoint slides, verbal discussion, and written materials to support subject matter. The instructor gives an explanation and review of the Therapeutic Lifestyle Changes diet recommendations, which includes a discussion on lipid goals, dietary fat, sodium, fiber, plant stanol/sterol esters, sugar, and the components of a well-balanced, healthy diet.   Nutrition II class: Lifestyle Skills:  -Group instruction provided by PowerPoint slides, verbal discussion, and written materials to support subject matter. The instructor gives an explanation and review of label reading, grocery shopping for heart health, heart healthy recipe modifications, and ways to make healthier choices when eating out.   Diabetes Question & Answer:  -Group instruction provided by PowerPoint slides, verbal discussion, and written materials to support subject matter. The instructor gives an explanation and review of diabetes co-morbidities, pre- and post-prandial blood glucose goals, pre-exercise blood glucose goals, signs, symptoms, and treatment of  hypoglycemia and hyperglycemia, and foot care basics.   Diabetes Blitz:  -Group instruction provided by PowerPoint slides, verbal discussion, and written materials to support subject matter. The instructor gives an explanation and review of the physiology behind type 1 and type 2 diabetes, diabetes medications and rational behind using different medications, pre- and post-prandial blood glucose recommendations and Hemoglobin A1c goals, diabetes diet, and exercise including blood glucose guidelines for exercising safely.    Portion Distortion:  -Group instruction provided by PowerPoint slides, verbal discussion, written materials, and food models to support subject matter. The instructor gives an explanation of serving size versus portion size, changes in portions sizes over the last 20 years, and what consists of a serving from each food group.   Stress Management:  -Group instruction provided by verbal instruction, video, and written materials to support subject matter.  Instructors review role of stress in heart disease and how to cope with stress positively.     Exercising on Your Own:  -Group instruction provided by verbal instruction, power point, and written materials to support subject.  Instructors discuss benefits of exercise, components of exercise, frequency and intensity of exercise, and end points for exercise.  Also discuss use of nitroglycerin and activating EMS.  Review options of places to exercise outside of rehab.  Review guidelines for sex with heart disease.   Cardiac Drugs I:  -Group instruction provided by verbal instruction and written materials to support subject.  Instructor reviews cardiac drug classes: antiplatelets, anticoagulants, beta blockers, and statins.  Instructor discusses reasons, side effects, and lifestyle considerations for each drug class.   Cardiac Drugs II:  -  Group instruction provided by verbal instruction and written materials to support subject.   Instructor reviews cardiac drug classes: angiotensin converting enzyme inhibitors (ACE-I), angiotensin II receptor blockers (ARBs), nitrates, and calcium channel blockers.  Instructor discusses reasons, side effects, and lifestyle considerations for each drug class.   Anatomy and Physiology of the Circulatory System:  Group verbal and written instruction and models provide basic cardiac anatomy and physiology, with the coronary electrical and arterial systems. Review of: AMI, Angina, Valve disease, Heart Failure, Peripheral Artery Disease, Cardiac Arrhythmia, Pacemakers, and the ICD.   Other Education:  -Group or individual verbal, written, or video instructions that support the educational goals of the cardiac rehab program.   Holiday Eating Survival Tips:  -Group instruction provided by PowerPoint slides, verbal discussion, and written materials to support subject matter. The instructor gives patients tips, tricks, and techniques to help them not only survive but enjoy the holidays despite the onslaught of food that accompanies the holidays.   Knowledge Questionnaire Score: Knowledge Questionnaire Score - 10/29/17 1524      Knowledge Questionnaire Score   Pre Score  21/24       Core Components/Risk Factors/Patient Goals at Admission: Personal Goals and Risk Factors at Admission - 10/29/17 1522      Core Components/Risk Factors/Patient Goals on Admission    Weight Management  Weight Loss;Yes    Intervention  Weight Management: Develop a combined nutrition and exercise program designed to reach desired caloric intake, while maintaining appropriate intake of nutrient and fiber, sodium and fats, and appropriate energy expenditure required for the weight goal.;Weight Management: Provide education and appropriate resources to help participant work on and attain dietary goals.;Weight Management/Obesity: Establish reasonable short term and long term weight goals.    Admit Weight  160 lb 15 oz  (73 kg)    Goal Weight: Long Term  140 lb (63.5 kg)    Expected Outcomes  Short Term: Continue to assess and modify interventions until short term weight is achieved;Long Term: Adherence to nutrition and physical activity/exercise program aimed toward attainment of established weight goal;Weight Loss: Understanding of general recommendations for a balanced deficit meal plan, which promotes 1-2 lb weight loss per week and includes a negative energy balance of [phone] kcal/d;Understanding recommendations for meals to include 15-35% energy as protein, 25-35% energy from fat, 35-60% energy from carbohydrates, less than '200mg'$  of dietary cholesterol, 20-35 gm of total fiber daily;Understanding of distribution of calorie intake throughout the day with the consumption of 4-5 meals/snacks    Diabetes  Yes    Intervention  Provide education about signs/symptoms and action to take for hypo/hyperglycemia.;Provide education about proper nutrition, including hydration, and aerobic/resistive exercise prescription along with prescribed medications to achieve blood glucose in normal ranges: Fasting glucose 65-99 mg/dL    Expected Outcomes  Short Term: Participant verbalizes understanding of the signs/symptoms and immediate care of hyper/hypoglycemia, proper foot care and importance of medication, aerobic/resistive exercise and nutrition plan for blood glucose control.;Long Term: Attainment of HbA1C < 7%.    Hypertension  Yes    Intervention  Provide education on lifestyle modifcations including regular physical activity/exercise, weight management, moderate sodium restriction and increased consumption of fresh fruit, vegetables, and low fat dairy, alcohol moderation, and smoking cessation.;Monitor prescription use compliance.    Expected Outcomes  Short Term: Continued assessment and intervention until BP is < 140/92m HG in hypertensive participants. < 130/858mHG in hypertensive participants with diabetes, heart failure  or chronic kidney disease.;Long Term: Maintenance of blood pressure  at goal levels.    Lipids  Yes    Intervention  Provide education and support for participant on nutrition & aerobic/resistive exercise along with prescribed medications to achieve LDL '70mg'$ , HDL >'40mg'$ .    Expected Outcomes  Short Term: Participant states understanding of desired cholesterol values and is compliant with medications prescribed. Participant is following exercise prescription and nutrition guidelines.;Long Term: Cholesterol controlled with medications as prescribed, with individualized exercise RX and with personalized nutrition plan. Value goals: LDL < '70mg'$ , HDL > 40 mg.       Core Components/Risk Factors/Patient Goals Review:  Goals and Risk Factor Review    Row Name 11/19/17 1452 12/10/17 1334 12/31/17 1537         Core Components/Risk Factors/Patient Goals Review   Personal Goals Review  Weight Management/Obesity;Diabetes;Hypertension;Lipids  Weight Management/Obesity;Diabetes;Hypertension;Lipids  Weight Management/Obesity;Diabetes;Hypertension;Lipids     Review  Sharlynn's vital signs and self reported CBg's have been in the 200's. Holly Acosta is doing well with exercise and enjoying partcipating in phase 2 cardiac rehab. Holly Acosta continues to use her rolling walker for stability.  Holly Acosta's vital signs and self reported CBg's have been in the 200's. Holly Acosta is doing well with exercise and enjoying partcipating in phase 2 cardiac rehab. Holly Acosta continues to use her rolling walker for stability.  Holly Acosta's vital signs and self reported CBg's have been in the 200's. Holly Acosta is doing well with exercise and enjoying partcipating in phase 2 cardiac rehab. Holly Acosta continues to use her rolling walker for stability.     Expected Outcomes  Patient will participate in phase 2 cardiac rehab for exercise. Follow nutrtion and lifestyle opportunities.  Patient will participate in phase 2 cardiac rehab for exercise. Follow nutrtion and lifestyle opportunities.   Patient will participate in phase 2 cardiac rehab for exercise. Follow nutrtion and lifestyle opportunities.        Core Components/Risk Factors/Patient Goals at Discharge (Final Review):  Goals and Risk Factor Review - 12/31/17 1537      Core Components/Risk Factors/Patient Goals Review   Personal Goals Review  Weight Management/Obesity;Diabetes;Hypertension;Lipids    Review  Holly Acosta's vital signs and self reported CBg's have been in the 200's. Holly Acosta is doing well with exercise and enjoying partcipating in phase 2 cardiac rehab. Holly Acosta continues to use her rolling walker for stability.    Expected Outcomes  Patient will participate in phase 2 cardiac rehab for exercise. Follow nutrtion and lifestyle opportunities.       ITP Comments: ITP Comments    Row Name 10/29/17 1513 11/19/17 1446 12/10/17 1334 12/31/17 1537     ITP Comments  Dr. Fransico Him, Medical Director   30 Day ITP Review. Patient with good partcipation and attendance in phase 2 cardiac rehab.  30 Day ITP Review. Patient with good partcipation and attendance in phase 2 cardiac rehab.  30 Day ITP Review. Patient with good partcipation and attendance in phase 2 cardiac rehab.       Comments: See ITP comments.Barnet Pall, RN,BSN 12/31/2017 3:39 PM

## 2018-01-01 ENCOUNTER — Encounter (HOSPITAL_COMMUNITY): Payer: Medicaid Other

## 2018-01-01 DIAGNOSIS — G4733 Obstructive sleep apnea (adult) (pediatric): Secondary | ICD-10-CM | POA: Insufficient documentation

## 2018-01-01 HISTORY — DX: Obstructive sleep apnea (adult) (pediatric): G47.33

## 2018-01-01 NOTE — Addendum Note (Signed)
Addended by: Larey Seat on: 01/01/2018 12:19 PM   Modules accepted: Orders

## 2018-01-01 NOTE — Procedures (Signed)
PATIENT'S NAME:  Holly Acosta, Chretien DOB:      04/13/1955      MR#:    [id number]     DATE OF RECORDING: 12/27/2017 [facility] REFERRING M.D.:  Antony Contras, MD Study Performed:  Split-Night Titration Study HISTORY:  Holly Acosta is a 62 y.o. female patient and seen on 12-03-2017 upon referral for a sleep evaluation by Stroke, MD. Mrs. Holly Acosta is a [place] patient within the resident clinic, she was admitted to [facility] on 27 April 2017 for a cardiac infarct, status post stent placement, and she has been using aspirin and Brilinta. 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  An Echocardiogram based ejection fraction of 55 to 60% with multi regional hypokinesis was noted. The patient has known diabetes which has been poorly controlled at the time her HbA1c was 13.3.  Additional risk factors for stroke and heart disease are hyperlipidemia, obesity and untreated obstructive sleep apnea. The patient was seen by Dr. Cari Caraway and then by Dr. Leonie Man on 06 October 2017 HbA1c was now 9.3, still very elevated but improved.  She has a little bit of bruising and bleeding from Brilinta in combination with aspirin.  She is using a walker, rehab had been delayed due to transportation problems.  She takes Crestor and her blood pressure has been controlled.   Her previous sleep study 9/20/ 2011 was ordered through Drs. Mulberry and MacPherson at [facility] with Baird Lyons, MD, it revealed severe OSA at an AHI of 48/h. CPAP therapy was recommended and sleep tests were obtained through the [facility] lab. The machine is 62 years old and now unusable / broken. She never followed in the [facility] sleep medicine clinic.    Chief complaint according  to patient:" I may have to go back on CPAP - but I sleep fairly well" and " I easily fall asleep when I am inactive and not stimulated".  The patient endorsed the Epworth Sleepiness Scale at 19/24 points   The patient's weight 175 pounds with a height of 66 (inches), resulting in a BMI of 28.7 kg/m2. The patient's neck circumference measured 14 inches.  CURRENT MEDICATIONS: Tylenol, Norvasc, Aspirin, Coreg, Novolog, Lantus, Prinivil, Glucophage.   PROCEDURE:  This is a multichannel digital polysomnogram utilizing the Somnostar 11.2 system.  Electrodes and sensors were applied and monitored per AASM Specifications.   EEG, EOG, Chin and Limb EMG, were sampled at 200 Hz.  ECG, Snore and Nasal Pressure, Thermal Airflow, Respiratory Effort, CPAP Flow and Pressure, Oximetry was sampled at 50 Hz. Digital video and audio were recorded.      BASELINE STUDY WITHOUT CPAP RESULTS: Lights Out was at 22:06 and Lights On at 05:00.  Total recording time (TRT) was 184.5, with a total sleep time (TST) of 130.5 minutes.   The patient's sleep latency was 58 minutes. REM latency was 0 minutes.  The sleep efficiency was 70.7 %.    SLEEP ARCHITECTURE: WASO (Wake after sleep onset) was 27 minutes, Stage N1 was 9.5 minutes, Stage N2 was 91.5 minutes, Stage N3 was 29.5 minutes and Stage R (REM sleep) was 0 minutes.  The percentages were Stage N1 7.3%, Stage N2 70.1%, Stage N3 22.6% and no Stage  REM sleep.  RESPIRATORY ANALYSIS:  There were a total of 110 respiratory events:  31 obstructive apneas, 6 central apneas and 73 hypopneas with additional respiratory event related arousals (RERAs).  Snoring was noted. The total APNEA/HYPOPNEA INDEX (AHI) was 50.6 /hour.  0 events occurred in REM sleep and all 152 events in NREM. The patient spent 329 minutes sleep time in the supine position 0 minutes in non-supine. The supine AHI was 50.6 /hour versus a non-supine AHI of 0.0 /hour.  OXYGEN SATURATION & C02:  The wake baseline 02  saturation was 88%, with the lowest being 83%. Time spent below 89% saturation equaled 43 minutes. PERIODIC LIMB MOVEMENTS: The patient had a total of 6 Periodic Limb Movements.  The Periodic Limb Movement (PLM) index was 2.8 /hour and the PLM Arousal index was 1.4 /hour. The arousals were noted as: 25 were spontaneous, 3 were associated with PLMs, and 39 were associated with respiratory events. Audio and video analysis did not show any abnormal or unusual movements, behaviors, phonations or vocalizations. The patient took bathroom breaks. Snoring was noted. EKG was in keeping with normal sinus rhythm (NSR)  TITRATION STUDY WITH CPAP RESULTS: CPAP was initiated at 5 cmH20 with heated humidity per AASM split night standards and pressure was advanced to 14 cm CPAP at an AHI of 11.4/h- nadir 92% and 10.5 minutes of sleep. The technician changed to BiPAP at 15/11 and reached an AHI of 6.2/h not a significant improvement.  Central apneas had emerged under CPAP. At a final BIPAP pressure of 15/12 cmH20, there was a reduction of the AHI to 10.6 /hour. No optimal pressure was reached.  Total recording time (TRT) was 230.5 minutes, with a total sleep time (TST) of 198.5 minutes. The patient's sleep latency was 0 minutes. REM latency was 60.5 minutes.  The sleep efficiency was 86.1 %.    SLEEP ARCHITECTURE: Wake after sleep was 27.5 minutes, Stage N1 2.5 minutes, Stage N2 110.5 minutes, Stage N3 49.5 minutes and Stage R (REM sleep) 36 minutes. The percentages were: Stage N1 1.3%, Stage N2 55.7%, Stage N3 24.9% and Stage R (REM sleep) 18.1%. The sleep architecture was notable for REM rebound- and REM was associated with higher apnea counts.  . The arousals were noted as: 23 were spontaneous, 3 were associated with PLMs, and 13 were associated with respiratory events.  RESPIRATORY ANALYSIS:  There were a total of 108 respiratory events: 26 obstructive apneas, 22 central apneas and 0 mixed apneas with a total of 48  apneas and an apnea index (AI) of 14.5. There were 60 hypopneas with a hypopnea index of 18.1 /hour. The patient also had 0 respiratory event related arousals (RERAs).     The total APNEA/HYPOPNEA INDEX (AHI) was 32.6 /hour and the total RESPIRATORY DISTURBANCE INDEX was 32.6 /hour.  18 events occurred in REM sleep and 90 events in NREM. The REM AHI was 30 /hour versus a non-REM AHI of 33.2 /hour. The patient spent 100% of total sleep time in the supine position. The supine AHI was 32.6 /hour, versus a non-supine AHI of 0.0/hour.  OXYGEN SATURATION & C02:  The wake baseline 02 saturation was 0%, with the lowest being 84%. Time spent below 89% saturation equaled 30 minutes.  PERIODIC LIMB MOVEMENTS:    The patient had a total of 7 Periodic Limb Movements. The Periodic Limb Movement (PLM) index was 2.1 /hour and the PLM Arousal index was 0.9 /hour.   Post-study, the patient indicated that sleep was  the same as usual. The patient was fitted with a [employer] SIMPLUS in small size.   POLYSOMNOGRAPHY IMPRESSION :   1. Obstructive Sleep Apnea (OSA) at 50.6/h.  2. Incomplete resolution of OSA at CPAP up to 14 and BiPAP at 15/12/h.  3. Improvement of hypoxemia, but only after pressures above 12 cm water were reached. Primary Snoring improved.  RECOMMENDATIONS:  Return for a full night titration, beginning at 14 cm CPAP and switch in modalities to BiPAP starting again at 14/10 cm water. SIMPLUS mask.    A follow up appointment will be scheduled in the Sleep Clinic at [facility].      I certify that I have reviewed the entire raw data recording prior to the issuance of this report in accordance with the Standards of Accreditation of the American Academy of Sleep Medicine (AASM)    Larey Seat, M.D.  01-01-2018  Diplomat, American Board of Psychiatry and Neurology  Diplomat, [place] of Sleep Medicine Medical Director, [place] [facility] at [facility]

## 2018-01-04 ENCOUNTER — Encounter (HOSPITAL_COMMUNITY): Payer: Medicaid Other

## 2018-01-05 ENCOUNTER — Telehealth: Payer: Self-pay | Admitting: Neurology

## 2018-01-05 ENCOUNTER — Other Ambulatory Visit: Payer: Self-pay | Admitting: *Deleted

## 2018-01-05 NOTE — Telephone Encounter (Signed)
I called pt. I advised pt that Dr. Brett Fairy reviewed their sleep study results and found that had sleep apnea and we attempted to treat with CPAP and central apnea emerged.They placed the patient on Bipap and was unable to get completely treated with Bipap so Dr Brett Fairy  recommends that pt return for a repeat sleep study in order to properly titrate the Bipap and ensure a good mask fit. Pt is agreeable to returning for a titration study. I advised pt that our sleep lab will file with pt's insurance and call pt to schedule the sleep study when we hear back from the pt's insurance regarding coverage of this sleep study. Pt verbalized understanding of results. Pt had no questions at this time but was encouraged to call back if questions arise.

## 2018-01-05 NOTE — Telephone Encounter (Signed)
-----   Message from Larey Seat, MD sent at 01/01/2018 12:19 PM EST ----- POLYSOMNOGRAPHY IMPRESSION :   1. Obstructive Sleep Apnea (OSA) at 50.6/h.  2. Incomplete resolution of OSA at CPAP up to 14 and BiPAP at  15/12/h.  3. Improvement of hypoxemia, but only after pressures above 12 cm  water were reached. Primary Snoring improved.  RECOMMENDATIONS: Return for a full night titration, beginning at  14 cm CPAP and switch in modalities to BiPAP starting again at  14/10 cm water. SIMPLUS mask.

## 2018-01-06 ENCOUNTER — Encounter (HOSPITAL_COMMUNITY): Payer: Medicaid Other

## 2018-01-06 ENCOUNTER — Telehealth (HOSPITAL_COMMUNITY): Payer: Self-pay | Admitting: Family Medicine

## 2018-01-06 ENCOUNTER — Encounter: Payer: Medicaid Other | Admitting: Family Medicine

## 2018-01-06 MED ORDER — METFORMIN HCL 1000 MG PO TABS: ORAL_TABLET | ORAL | 2 refills | Status: DC

## 2018-01-08 ENCOUNTER — Encounter (HOSPITAL_COMMUNITY): Payer: Medicaid Other

## 2018-01-08 ENCOUNTER — Telehealth (HOSPITAL_COMMUNITY): Payer: Self-pay | Admitting: Family Medicine

## 2018-01-11 ENCOUNTER — Encounter (HOSPITAL_COMMUNITY): Payer: Medicaid Other

## 2018-01-14 ENCOUNTER — Encounter: Payer: Medicaid Other | Admitting: Family Medicine

## 2018-01-15 ENCOUNTER — Encounter (HOSPITAL_COMMUNITY): Payer: Medicaid Other

## 2018-01-18 ENCOUNTER — Encounter (HOSPITAL_COMMUNITY)
Admission: RE | Admit: 2018-01-18 | Discharge: 2018-01-18 | Disposition: A | Discharge status: Home / Self Care | Payer: Medicaid Other | Source: Ambulatory Visit | Attending: Cardiology | Admitting: Cardiology

## 2018-01-18 ENCOUNTER — Encounter (HOSPITAL_COMMUNITY): Payer: Medicaid Other

## 2018-01-18 VITALS — BP 120/70 | HR 78 | Ht 64.5 in | Wt 174.4 lb

## 2018-01-18 DIAGNOSIS — Z955 Presence of coronary angioplasty implant and graft: Secondary | ICD-10-CM

## 2018-01-18 DIAGNOSIS — I252 Old myocardial infarction: Secondary | ICD-10-CM | POA: Diagnosis not present

## 2018-01-18 DIAGNOSIS — I214 Non-ST elevation (NSTEMI) myocardial infarction: Secondary | ICD-10-CM

## 2018-01-22 ENCOUNTER — Encounter (HOSPITAL_COMMUNITY): Payer: Medicaid Other

## 2018-01-25 ENCOUNTER — Encounter (HOSPITAL_COMMUNITY): Payer: Medicaid Other

## 2018-01-27 ENCOUNTER — Encounter (HOSPITAL_COMMUNITY): Payer: Medicaid Other

## 2018-01-29 ENCOUNTER — Encounter (HOSPITAL_COMMUNITY): Payer: Medicaid Other

## 2018-01-31 ENCOUNTER — Ambulatory Visit (INDEPENDENT_AMBULATORY_CARE_PROVIDER_SITE_OTHER): Payer: Medicaid Other | Admitting: Neurology

## 2018-01-31 DIAGNOSIS — G4739 Other sleep apnea: Secondary | ICD-10-CM

## 2018-01-31 DIAGNOSIS — R0683 Snoring: Secondary | ICD-10-CM

## 2018-01-31 DIAGNOSIS — I63532 Cerebral infarction due to unspecified occlusion or stenosis of left posterior cerebral artery: Secondary | ICD-10-CM

## 2018-01-31 DIAGNOSIS — G4719 Other hypersomnia: Secondary | ICD-10-CM

## 2018-01-31 DIAGNOSIS — G4733 Obstructive sleep apnea (adult) (pediatric): Secondary | ICD-10-CM | POA: Diagnosis not present

## 2018-01-31 DIAGNOSIS — E1165 Type 2 diabetes mellitus with hyperglycemia: Secondary | ICD-10-CM

## 2018-01-31 DIAGNOSIS — G4731 Primary central sleep apnea: Principal | ICD-10-CM

## 2018-01-31 DIAGNOSIS — I10 Essential (primary) hypertension: Secondary | ICD-10-CM

## 2018-02-01 ENCOUNTER — Encounter (HOSPITAL_COMMUNITY): Payer: Medicaid Other

## 2018-02-01 ENCOUNTER — Other Ambulatory Visit: Payer: Self-pay

## 2018-02-01 DIAGNOSIS — E1165 Type 2 diabetes mellitus with hyperglycemia: Principal | ICD-10-CM

## 2018-02-01 DIAGNOSIS — Z794 Long term (current) use of insulin: Principal | ICD-10-CM

## 2018-02-01 DIAGNOSIS — IMO0001 Reserved for inherently not codable concepts without codable children: Secondary | ICD-10-CM

## 2018-02-01 MED ORDER — INSULIN ASPART 100 UNIT/ML FLEXPEN: PEN_INJECTOR | SUBCUTANEOUS | 2 refills | Status: DC

## 2018-02-03 ENCOUNTER — Encounter (HOSPITAL_COMMUNITY): Payer: Medicaid Other

## 2018-02-04 ENCOUNTER — Ambulatory Visit (INDEPENDENT_AMBULATORY_CARE_PROVIDER_SITE_OTHER): Payer: Medicaid Other | Admitting: Family Medicine

## 2018-02-04 VITALS — BP 120/70 | HR 71 | Temp 98.4°F | Wt 173.4 lb

## 2018-02-04 DIAGNOSIS — Z01419 Encounter for gynecological examination (general) (routine) without abnormal findings: Secondary | ICD-10-CM | POA: Diagnosis not present

## 2018-02-04 NOTE — Patient Instructions (Addendum)
Thank you for coming in to see [place] today! Please see below to review our plan for today's visit:  1. Please return to Colposcopy clinic to complete your Pap smear and exam (February 25, 2018 at 11:30am). 2. We are referring you for a pelvic ultrasound due to finding a right-sided mass in your pelvis. This could be a slightly enlarged Ovary or maybe a uterine fibroid; either way it needs to be investigated! We will follow up with you for more details regarding this scan.  Please call the clinic at [phone] if your symptoms worsen or you have any concerns. It was our pleasure to serve you!    Dr. Milus Banister [facility]  Health Maintenance, Female Adopting a healthy lifestyle and getting preventive care can go a long way to promote health and wellness. Talk with your health care provider about what schedule of regular examinations is right for you. This is a good chance for you to check in with your provider about disease prevention and staying healthy. In between checkups, there are plenty of things you can do on your own. Experts have done a lot of research about which lifestyle changes and preventive measures are most likely to keep you healthy. Ask your health care provider for more information. Weight and diet Eat a healthy diet  Be sure to include plenty of vegetables, fruits, low-fat dairy products, and lean protein.  Do not eat a lot of foods high in solid fats, added sugars, or salt.  Get regular exercise. This is one of the most important things you can do for your health. ? Most adults should exercise for at least 150 minutes each week. The exercise should increase your heart rate and make you sweat (moderate-intensity exercise). ? Most adults should also do strengthening exercises at least twice a week. This is in addition to the moderate-intensity exercise. Maintain a healthy weight  Body mass index (BMI) is a measurement that can be used to identify  possible weight problems. It estimates body fat based on height and weight. Your health care provider can help determine your BMI and help you achieve or maintain a healthy weight.  For females 25 years of age and older: ? A BMI below 18.5 is considered underweight. ? A BMI of 18.5 to 24.9 is normal. ? A BMI of 25 to 29.9 is considered overweight. ? A BMI of 30 and above is considered obese. Watch levels of cholesterol and blood lipids  You should start having your blood tested for lipids and cholesterol at 63 years of age, then have this test every 5 years.  You may need to have your cholesterol levels checked more often if: ? Your lipid or cholesterol levels are high. ? You are older than 63 years of age. ? You are at high risk for heart disease. Cancer screening Lung Cancer  Lung cancer screening is recommended for adults 33-44 years old who are at high risk for lung cancer because of a history of smoking.  A yearly low-dose CT scan of the lungs is recommended for people who: ? Currently smoke. ? Have quit within the past 15 years. ? Have at least a 30-pack-year history of smoking. A pack year is smoking an average of one pack of cigarettes a day for 1 year.  Yearly screening should continue until it has been 15 years since you quit.  Yearly screening should stop if you develop a health problem that would prevent you from having lung cancer  treatment. Breast Cancer  Practice breast self-awareness. This means understanding how your breasts normally appear and feel.  It also means doing regular breast self-exams. Let your health care provider know about any changes, no matter how small.  If you are in your 20s or 30s, you should have a clinical breast exam (CBE) by a health care provider every 1-3 years as part of a regular health exam.  If you are 80 or older, have a CBE every year. Also consider having a breast X-ray (mammogram) every year.  If you have a family history of  breast cancer, talk to your health care provider about genetic screening.  If you are at high risk for breast cancer, talk to your health care provider about having an MRI and a mammogram every year.  Breast cancer gene (BRCA) assessment is recommended for women who have family members with BRCA-related cancers. BRCA-related cancers include: ? Breast. ? Ovarian. ? Tubal. ? Peritoneal cancers.  Results of the assessment will determine the need for genetic counseling and BRCA1 and BRCA2 testing. Cervical Cancer Your health care provider may recommend that you be screened regularly for cancer of the pelvic organs (ovaries, uterus, and vagina). This screening involves a pelvic examination, including checking for microscopic changes to the surface of your cervix (Pap test). You may be encouraged to have this screening done every 3 years, beginning at age 16.  For women ages 13-65, health care providers may recommend pelvic exams and Pap testing every 3 years, or they may recommend the Pap and pelvic exam, combined with testing for human papilloma virus (HPV), every 5 years. Some types of HPV increase your risk of cervical cancer. Testing for HPV may also be done on women of any age with unclear Pap test results.  Other health care providers may not recommend any screening for nonpregnant women who are considered low risk for pelvic cancer and who do not have symptoms. Ask your health care provider if a screening pelvic exam is right for you.  If you have had past treatment for cervical cancer or a condition that could lead to cancer, you need Pap tests and screening for cancer for at least 20 years after your treatment. If Pap tests have been discontinued, your risk factors (such as having a new sexual partner) need to be reassessed to determine if screening should resume. Some women have medical problems that increase the chance of getting cervical cancer. In these cases, your health care provider may  recommend more frequent screening and Pap tests. Colorectal Cancer  This type of cancer can be detected and often prevented.  Routine colorectal cancer screening usually begins at 63 years of age and continues through 63 years of age.  Your health care provider may recommend screening at an earlier age if you have risk factors for colon cancer.  Your health care provider may also recommend using home test kits to check for hidden blood in the stool.  A small camera at the end of a tube can be used to examine your colon directly (sigmoidoscopy or colonoscopy). This is done to check for the earliest forms of colorectal cancer.  Routine screening usually begins at age 32.  Direct examination of the colon should be repeated every 5-10 years through 63 years of age. However, you may need to be screened more often if early forms of precancerous polyps or small growths are found. Skin Cancer  Check your skin from head to toe regularly.  Tell your health  care provider about any new moles or changes in moles, especially if there is a change in a mole's shape or color.  Also tell your health care provider if you have a mole that is larger than the size of a pencil eraser.  Always use sunscreen. Apply sunscreen liberally and repeatedly throughout the day.  Protect yourself by wearing long sleeves, pants, a wide-brimmed hat, and sunglasses whenever you are outside. Heart disease, diabetes, and high blood pressure  High blood pressure causes heart disease and increases the risk of stroke. High blood pressure is more likely to develop in: ? People who have blood pressure in the high end of the normal range (130-139/85-89 mm Hg). ? People who are overweight or obese. ? People who are African American.  If you are 45-26 years of age, have your blood pressure checked every 3-5 years. If you are 90 years of age or older, have your blood pressure checked every year. You should have your blood pressure  measured twice-once when you are at a hospital or clinic, and once when you are not at a hospital or clinic. Record the average of the two measurements. To check your blood pressure when you are not at a hospital or clinic, you can use: ? An automated blood pressure machine at a pharmacy. ? A home blood pressure monitor.  If you are between 74 years and 15 years old, ask your health care provider if you should take aspirin to prevent strokes.  Have regular diabetes screenings. This involves taking a blood sample to check your fasting blood sugar level. ? If you are at a normal weight and have a low risk for diabetes, have this test once every three years after 63 years of age. ? If you are overweight and have a high risk for diabetes, consider being tested at a younger age or more often. Preventing infection Hepatitis B  If you have a higher risk for hepatitis B, you should be screened for this virus. You are considered at high risk for hepatitis B if: ? You were born in a country where hepatitis B is common. Ask your health care provider which countries are considered high risk. ? Your parents were born in a high-risk country, and you have not been immunized against hepatitis B (hepatitis B vaccine). ? You have HIV or AIDS. ? You use needles to inject street drugs. ? You live with someone who has hepatitis B. ? You have had sex with someone who has hepatitis B. ? You get hemodialysis treatment. ? You take certain medicines for conditions, including cancer, organ transplantation, and autoimmune conditions. Hepatitis C  Blood testing is recommended for: ? Everyone born from [birth year] through 1965. ? Anyone with known risk factors for hepatitis C. Sexually transmitted infections (STIs)  You should be screened for sexually transmitted infections (STIs) including gonorrhea and chlamydia if: ? You are sexually active and are younger than 63 years of age. ? You are older than 63 years of age and  your health care provider tells you that you are at risk for this type of infection. ? Your sexual activity has changed since you were last screened and you are at an increased risk for chlamydia or gonorrhea. Ask your health care provider if you are at risk.  If you do not have HIV, but are at risk, it may be recommended that you take a prescription medicine daily to prevent HIV infection. This is called pre-exposure prophylaxis (PrEP). You are considered  at risk if: ? You are sexually active and do not regularly use condoms or know the HIV status of your partner(s). ? You take drugs by injection. ? You are sexually active with a partner who has HIV. Talk with your health care provider about whether you are at high risk of being infected with HIV. If you choose to begin PrEP, you should first be tested for HIV. You should then be tested every 3 months for as long as you are taking PrEP. Pregnancy  If you are premenopausal and you may become pregnant, ask your health care provider about preconception counseling.  If you may become pregnant, take 400 to 800 micrograms (mcg) of folic acid every day.  If you want to prevent pregnancy, talk to your health care provider about birth control (contraception). Osteoporosis and menopause  Osteoporosis is a disease in which the bones lose minerals and strength with aging. This can result in serious bone fractures. Your risk for osteoporosis can be identified using a bone density scan.  If you are 18 years of age or older, or if you are at risk for osteoporosis and fractures, ask your health care provider if you should be screened.  Ask your health care provider whether you should take a calcium or vitamin D supplement to lower your risk for osteoporosis.  Menopause may have certain physical symptoms and risks.  Hormone replacement therapy may reduce some of these symptoms and risks. Talk to your health care provider about whether hormone replacement  therapy is right for you. Follow these instructions at home:  Schedule regular health, dental, and eye exams.  Stay current with your immunizations.  Do not use any tobacco products including cigarettes, chewing tobacco, or electronic cigarettes.  If you are pregnant, do not drink alcohol.  If you are breastfeeding, limit how much and how often you drink alcohol.  Limit alcohol intake to no more than 1 drink per day for nonpregnant women. One drink equals 12 ounces of beer, 5 ounces of wine, or 1 ounces of hard liquor.  Do not use street drugs.  Do not share needles.  Ask your health care provider for help if you need support or information about quitting drugs.  Tell your health care provider if you often feel depressed.  Tell your health care provider if you have ever been abused or do not feel safe at home. This information is not intended to replace advice given to you by your health care provider. Make sure you discuss any questions you have with your health care provider. Document Released: 07/22/2010 Document Revised: 06/14/2015 Document Reviewed: 10/10/2014 Elsevier Interactive Patient Education  2019 [employer].

## 2018-02-04 NOTE — Progress Notes (Signed)
cytology

## 2018-02-08 DIAGNOSIS — Z01419 Encounter for gynecological examination (general) (routine) without abnormal findings: Secondary | ICD-10-CM | POA: Insufficient documentation

## 2018-02-08 NOTE — Assessment & Plan Note (Signed)
Cervix was unable to be visualized on speculum exam due to posterior placement and vaginal wall collapse. Wall protractors were not available for this exam. Glove was placed around the speculum to help retract tissue failed to visualize the cervix. Right-sided Pelvic mass palpated on bimanual exam. - Rescheduled for [facility] for Feb 25, 2018 - Referred for pelvic ultrasound to investigate pelvic mass.

## 2018-02-08 NOTE — Progress Notes (Signed)
SUBJECTIVE:   Holly Acosta is a 63 y.o. female and is here for a comprehensive physical exam. The patient has no complaints today. She has taken out her pessary device in preparation for the pelvic exam.  Social History   Socioeconomic History  . Marital status: Divorced    Spouse name: Not on file  . Number of children: Not on file  . Years of education: Not on file  . Highest education level: Not on file  Occupational History  . Not on file  Social Needs  . Financial resource strain: Not on file  . Food insecurity:    Worry: Not on file    Inability: Not on file  . Transportation needs:    Medical: Not on file    Non-medical: Not on file  Tobacco Use  . Smoking status: Former Smoker    Packs/day: 1.00    Years: 33.00    Pack years: 33.00    Types: Cigarettes    Start date: 01/20/1973    Last attempt to quit: 11/04/2006    Years since quitting: 11.2  . Smokeless tobacco: Never Used  Substance and Sexual Activity  . Alcohol use: Never    Alcohol/week: 0.0 standard drinks    Frequency: Never  . Drug use: Never  . Sexual activity: Not Currently  Lifestyle  . Physical activity:    Days per week: Not on file    Minutes per session: Not on file  . Stress: Not on file  Relationships  . Social connections:    Talks on phone: Not on file    Gets together: Not on file    Attends religious service: Not on file    Active member of club or organization: Not on file    Attends meetings of clubs or organizations: Not on file    Relationship status: Not on file  . Intimate partner violence:    Fear of current or ex partner: Not on file    Emotionally abused: Not on file    Physically abused: Not on file    Forced sexual activity: Not on file  Other Topics Concern  . Not on file  Social History Narrative   Lives with daughter Holly Acosta age 90) and 4 grandchildren.   Disability - Arthritis. Does get disability check.   Does not have a living will yet   Health Maintenance   Topic Date Due  . PAP SMEAR-Modifier  08/13/2012  . OPHTHALMOLOGY EXAM  10/07/2014  . TETANUS/TDAP  08/18/2017  . HEMOGLOBIN A1C  03/07/2018  . FOOT EXAM  06/13/2018  . MAMMOGRAM  10/06/2019  . COLONOSCOPY  01/21/2020  . INFLUENZA VACCINE  Completed  . PNEUMOCOCCAL POLYSACCHARIDE VACCINE AGE 57-64 HIGH RISK  Completed  . Hepatitis C Screening  Completed  . HIV Screening  Completed   Review of Systems  Cardiovascular: Negative for chest pain.  Gastrointestinal: Negative for constipation, diarrhea, nausea and vomiting.  Genitourinary: Negative for dysuria, flank pain and urgency.  Musculoskeletal: Positive for joint pain (Chronic).   Objective:  Blood pressure 120/70, pulse 71, temperature 98.4 F (36.9 C), temperature source Oral, weight 78.7 kg, SpO2 97 %.  Physical Exam Constitutional:      Appearance: She is obese.  Cardiovascular:     Rate and Rhythm: Normal rate and regular rhythm.  Pulmonary:     Effort: Pulmonary effort is normal.     Breath sounds: Normal breath sounds.  Abdominal:     Hernia: A hernia (ventral, stable,  chronic) is present.  Genitourinary:    Comments: Unable to visualize cervix on speculum exam due to vaginal wall collapse and posterior cervix; right-sided pelvic mass palpated on bimanual exam Neurological:     Mental Status: She is alert.       Assessment:    Healthy female exam. Exam was unable to be completed due to vaginal wall collapse on speculum exam. Wall retractors were not available for exam.     Plan:  Cervix was unable to be visualized on speculum exam due to posterior placement and vaginal wall collapse. Wall protractors were not available for this exam. Glove was placed around the speculum to help retract tissue failed to visualize the cervix. Right-sided Pelvic mass palpated on bimanual exam. - Rescheduled for [facility] for Feb 25, 2018 - Referred for pelvic ultrasound to investigate pelvic mass.   Follow up appt:  02/25/2018

## 2018-02-09 ENCOUNTER — Telehealth: Payer: Self-pay | Admitting: Neurology

## 2018-02-09 NOTE — Addendum Note (Signed)
Addended by: Larey Seat on: 02/09/2018 05:09 PM   Modules accepted: Orders

## 2018-02-09 NOTE — Procedures (Signed)
PATIENT'S NAME:  Holly Acosta, Holly Acosta DOB:      November 29, 1955      MR#:    [id number]     DATE OF RECORDING: 01/31/2018   [facility] REFERRING M.D.:  Antony Contras, MD Study Performed:  Full night Titration to positive airway pressure HISTORY: This Stroke and Cardiac - Patient went through a CPAP/BIPAP titration sleep study, after having had a Split-night sleep study on 12/27/17. Patient had an AHI of 50.6/h, and 30 minutes of hypoxemia, with an oxygen nadir of 83%. 1. Obstructive Sleep Apnea (OSA) at 50.6/h.  2. Incomplete resolution of OSA at CPAP up to 14 cm H20 and BiPAP at 15/12 water.  3. Improvement of hypoxemia, but only after pressures above 12 cm water were reached.  4. Primary Snoring improved.   RECOMMENDATIONS:  Return for a full night titration, beginning at 14 cm CPAP and switch as needed to BiPAP, but starting again at 14/10 cm water. SIMPLUS mask.    The patient endorsed the Epworth Sleepiness Scale at 19/24 points and the Fatigue Score at 23 points.   The patient's weight 175 pounds with a height of 65.5 (inches), resulting in a BMI of 29.1 kg/m2. The patient's neck circumference measured 14 inches.  CURRENT MEDICATIONS: Tylenol, Norvasc, Aspirin, Coreg, Novolog, Lantus, Prinivil, Glucophage.   PROCEDURE:  This is a multichannel digital polysomnogram utilizing the SomnoStar 11.2 system.  Electrodes and sensors were applied and monitored per AASM Specifications.   EEG, EOG, Chin and Limb EMG, were sampled at 200 Hz.  ECG, Snore and Nasal Pressure, Thermal Airflow, Respiratory Effort, CPAP Flow and Pressure, Oximetry was sampled at 50 Hz. Digital video and audio were recorded.      CPAP was initiated at 14 cmH20 ( AHI was 35.1) with heated humidity per AASM split night standards and pressure was advanced to BiPAP at 14/10cmH20 because of hypopneas, apneas and desaturations.   At a final BIPAP pressure of 21/18 cmH20, there was a reduction of the AHI to 0.0 with improvement of sleep apnea seen  over 44 minutes, nadir was 93%.  Lights Out was at 21:48 and Lights On at 04:57. Total recording time (TRT) was 429 minutes, with a total sleep time (TST) of 355.5 minutes. The patient's sleep latency was 107 minutes. REM latency was 161 minutes.  The sleep efficiency was 82.9 %.    SLEEP ARCHITECTURE: WASO (Wake after sleep onset) was 66.5 minutes.  There were 12.5 minutes in Stage N1, 173 minutes Stage N2, 107 minutes Stage N3 and 63 minutes in Stage REM.  The percentage of Stage N1 was 3.5%, Stage N2 was 48.7%, Stage N3 was 30.1% and Stage R (REM sleep) was 17.7%.  RESPIRATORY ANALYSIS:  There was a total of 154 respiratory events: 22 obstructive apneas, 89 central apneas and 0 mixed apneas with a total of 111 apneas and an apnea index (AI) of 18.7 /hour. There were 43 hypopneas with a hypopnea index of 7.3/hour. The patient also had 0 respiratory event related arousals (RERAs).     The total APNEA/HYPOPNEA INDEX (AHI) was 26.0 /hour.  5 events occurred in REM sleep and 149 events in NREM. The REM AHI was 4.8 /hour versus a non-REM AHI of 30.6 /hour.  The patient spent 355.5 minutes of total sleep time in the supine position and 0 minutes in non-supine. The supine AHI was 26.0. OXYGEN SATURATION & C02:  The baseline 02 saturation was 94%, with the lowest being 86%. Time spent below 89% saturation equaled  37 minutes. The patient had a total of 0 Periodic Limb Movements.  Audio and video analysis did not show any abnormal or unusual movements, behaviors, phonations or vocalizations.   EKG was in keeping with normal sinus rhythm.  Post-study, the patient indicated that sleep was the same as usual.    DIAGNOSIS 1. Central Sleep Apnea, under CPAP and BiPAP was finally controlled under high BIPAP pressures of 21/18 cm water.  2. Sleep Apnea Related Hypoxemia 3. Upper Airway Resistance Syndrome   PLANS/RECOMMENDATIONS:  will start on BiPAP at 21/18 cm water.   1. Achieve and maintain lean body  weight. 2. The patient should avoid evening sedatives, hypnotics, and alcoholic beverage consumption 3. CPAP therapy compliance is defined as minimum of 4 hours of nightly use.  A follow up appointment will be scheduled in the Sleep Clinic at [facility].   Please call [phone] with any questions.      I certify that I have reviewed the entire raw data recording prior to the issuance of this report in accordance with the Standards of Accreditation of the American Academy of Sleep Medicine (AASM)     Larey Seat, M.D.   02-09-2018 Diplomat, American Board of Psychiatry and Neurology  Diplomat, [place] of Sleep Medicine Medical Director, [place] [facility] at [facility]

## 2018-02-09 NOTE — Telephone Encounter (Signed)
Patient was started on BIPAP 21/18 cm water , SIMPLUS FFM and heated humidity.

## 2018-02-10 ENCOUNTER — Telehealth: Payer: Self-pay | Admitting: Neurology

## 2018-02-10 NOTE — Telephone Encounter (Signed)
Thank you for the update!

## 2018-02-10 NOTE — Telephone Encounter (Signed)
I called pt. I advised pt that Dr. Brett Fairy reviewed their sleep study results and found that pt has sleep apnea and was treated well with Bipap. Dr. Brett Fairy recommends that pt starts treatment with Bipap machine. I reviewed PAP compliance expectations with the pt. Pt is agreeable to starting a BiPAP. I advised pt that an order will be sent to a DME, [facility], and [facility] will call the pt within about one week after they file with the pt's insurance. [facility] will show the pt how to use the machine, fit for masks, and troubleshoot the BiPAP if needed. A follow up appt was made for insurance purposes with Elisha Headland, NP  on April 29, 2018 at 12:45 pm. Pt verbalized understanding to arrive 15 minutes early and bring their BiPAP. A letter with all of this information in it will be mailed to the pt as a reminder. I verified with the pt that the address we have on file is correct. Pt verbalized understanding of results. Pt had no questions at this time but was encouraged to call back if questions arise. I have sent the order to [facility] and have received confirmation that they have received the order.

## 2018-02-10 NOTE — Telephone Encounter (Signed)
-----   Message from Larey Seat, MD sent at 02/09/2018  5:09 PM EST ----- DIAGNOSIS 1. Central Sleep Apnea, under CPAP and BiPAP was finally  controlled under high BIPAP pressures of 21/18 cm water.  2. Sleep Apnea Related Hypoxemia 3. Upper Airway Resistance Syndrome   PLANS/RECOMMENDATIONS: will start on BiPAP at 21/18 cm water.   1. Achieve and maintain lean body weight. 2. The patient should avoid evening sedatives, hypnotics, and  alcoholic beverage consumption 3. CPAP therapy compliance is defined as minimum of 4 hours of  nightly use.

## 2018-02-11 NOTE — Progress Notes (Signed)
Holly Acosta 63 y.o. female DOB: [birth year] MRN: [id number]      Nutrition Note  1. NSTEMI (non-ST elevated myocardial infarction) ([place])   2. Status post coronary artery stent placement, DES CFX, 04/24/17     Past Medical History:  Diagnosis Date  . Acute cerebrovascular accident (CVA) of cerebellum ([place]) 04/29/2017  . Anemia   . Arthritis of knee    "both knees" (04/24/2017)  . Coronary artery disease involving native coronary artery of native heart with unstable angina pectoris ([place]) 04/30/2017  . Diabetes mellitus, type 2 ([place])   . History of blood transfusion    "related to prolapsed uterus"  . HLD (hyperlipidemia)   . HTN (hypertension)   . Non-ST elevation (NSTEMI) myocardial infarction ([place])   . Prolapsed uterus   . Sleep apnea    "have mask; haven't worn it for a long time" (04/24/2017)  . Status post coronary artery stent placement    Meds reviewed.   Current Outpatient Medications (Endocrine & Metabolic):  .  insulin aspart (NOVOLOG FLEXPEN) 100 UNIT/ML FlexPen, Inject subcutaneously as directed per sliding scale. Sliding scale: 201-250: 2 units; 251-300: 4 units; 301-350: 6 units; 351-400: 8 units. .  Insulin Glargine (LANTUS SOLOSTAR) 100 UNIT/ML Solostar Pen, INJECT 32 units into skin dialy .  metFORMIN (GLUCOPHAGE) 1000 MG tablet, TAKE 1 TABLET BY MOUTH TWICE DAILY WITH A MEAL  Current Outpatient Medications (Cardiovascular):  .  amLODipine (NORVASC) 5 MG tablet, Take 1 tablet (5 mg total) by mouth daily. .  carvedilol (COREG) 12.5 MG tablet, Take 1 tablet (12.5 mg total) by mouth 2 (two) times daily with a meal. .  lisinopril (PRINIVIL,ZESTRIL) 20 MG tablet, Take 1 tablet (20 mg total) by mouth daily. .  rosuvastatin (CRESTOR) 40 MG tablet, Take 1 tablet (40 mg total) by mouth daily.   Current Outpatient Medications (Analgesics):  .  acetaminophen (TYLENOL) 500 MG tablet, Take 1,000 mg by mouth every 12 (twelve) hours as needed for mild pain.  Marland Kitchen  aspirin EC 81 MG  tablet, Take 1 tablet (81 mg total) by mouth daily.  Current Outpatient Medications (Hematological):  .  ticagrelor (BRILINTA) 90 MG TABS tablet, Take 1 tablet (90 mg total) by mouth 2 (two) times daily.  Current Outpatient Medications (Other):  Marland Kitchen  ACCU-CHEK FASTCLIX LANCETS MISC, 1 each by Does not apply route daily. Check sugar daily as needed .  B-D UF III MINI PEN NEEDLES 31G X 5 MM MISC, USE AS DIRECTED 3 TIMES A DAY .  Blood Glucose Monitoring Suppl (ACCU-CHEK NANO SMARTVIEW) w/Device KIT, Use to test blood sugar up to 3 times a day. ICD-10 code: E11.9 .  glucose blood (ACCU-CHEK AVIVA) test strip, Use as instructed .  glucose blood (ACCU-CHEK SMARTVIEW) test strip, Use as instructed   HT: Ht Readings from Last 1 Encounters:  01/18/18 5' 4.5" (1.638 m)    WT: Wt Readings from Last 5 Encounters:  02/04/18 173 lb 6.4 oz (78.7 kg)  01/18/18 174 lb 6.1 oz (79.1 kg)  12/03/17 175 lb (79.4 kg)  11/24/17 171 lb 3.2 oz (77.7 kg)  11/10/17 170 lb 6.4 oz (77.3 kg)     Body mass index is 29.47 kg/m.   Current tobacco use? No  Labs:  Lipid Panel     Component Value Date/Time   CHOL 173 04/29/2017 0557   TRIG 159 (H) 04/29/2017 0557   HDL 34 (L) 04/29/2017 0557   CHOLHDL 5.1 04/29/2017 0557   VLDL 32 04/29/2017 0557  LDLCALC 107 (H) 04/29/2017 0557   LDLDIRECT 135 (H) 12/31/2012 0949    Lab Results  Component Value Date   HGBA1C 9.3 (A) 09/04/2017   CBG (last 3)  No results for input(s): GLUCAP in the last 72 hours.  Nutrition Note Spoke with pt. Nutrition plan and goals reviewed with pt. Pt is following Step 2 of the Therapeutic Lifestyle Changes diet. Pt wants to lose wt. Pt has not actively been trying to lose wt. Wt loss tips reviewed (label reading, how to build a healthy plate, portion sizes, eating frequently across the day). Pt last A1C was elevated. Discussed the differences between complex and refined carbs, recommended pt replace refined carbs with complex.  Reviewed the benefits swapping in complex carbs and moderating portion sizes can have on managing blood glucose with patient. Pt has Type 2 Diabetes. Last A1c indicates blood glucose well-controlled. This Probation officer went over Diabetes Education test results. Pt checks CBG's 2 times a day. Fasting CBG's reportedly 140's-150's mg/dL. Per discussion, pt does not use canned/convenience foods often. Pt does not add salt to food. Pt does not eat out frequently. Pt expressed understanding of the information reviewed. Pt aware of nutrition education classes offered and would like to attend nutrition classes.  Nutrition Diagnosis ? Food-and nutrition-related knowledge deficit related to lack of exposure to information as related to diagnosis of: ? CVD  Nutrition Intervention ? Pt's individual nutrition plan and goals reviewed with pt  Nutrition Goal(s):  ? Pt to identify and limit food sources of saturated fat, trans fat, refined carbohydrates and sodium ? Pt to identify food quantities necessary to achieve weight loss of 6-24 lbs. at graduation from cardiac rehab ? Pt to eat a variety of non-starchy vegetables. ? Pt able to name foods that affect blood glucose.   Plan:  ? Pt to attend nutrition classes ? Nutrition I ? Nutrition II ? Portion Distortion  ? Diabetes Blitz ? Diabetes Q & Ae determined ? Will provide client-centered nutrition education as part of interdisciplinary care ? Monitor and evaluate progress toward nutrition goal with team.   Laurina Bustle, MS, RD, LDN 02/11/2018 5:12 PM

## 2018-02-12 ENCOUNTER — Encounter (HOSPITAL_COMMUNITY): Payer: Self-pay | Admitting: *Deleted

## 2018-02-12 DIAGNOSIS — I214 Non-ST elevation (NSTEMI) myocardial infarction: Secondary | ICD-10-CM

## 2018-02-12 DIAGNOSIS — Z955 Presence of coronary angioplasty implant and graft: Secondary | ICD-10-CM

## 2018-02-12 NOTE — Progress Notes (Signed)
Discharge Progress Report  Patient Details  Name: Holly Acosta MRN: [id number] Date of Birth: 05/24/1955 Referring Provider:     [place] from 10/29/2017 in [facility]  Referring Provider  Dr. Peter Martinique, MD        Number of Visits: 14  Reason for Discharge:  Patient reached a stable level of exercise.  Smoking History:  Social History   Tobacco Use  Smoking Status Former Smoker  . Packs/day: 1.00  . Years: 33.00  . Pack years: 33.00  . Types: Cigarettes  . Start date: 01/20/1973  . Last attempt to quit: 11/04/2006  . Years since quitting: 11.2  Smokeless Tobacco Never Used    Diagnosis:  NSTEMI (non-ST elevated myocardial infarction) ([place])  Status post coronary artery stent placement, DES CFX, 04/24/17   ADL UCSD:   Initial Exercise Prescription:   Discharge Exercise Prescription (Final Exercise Prescription Changes): Exercise Prescription Changes - 01/18/18 1503      Response to Exercise   Blood Pressure (Admit)  120/70    Blood Pressure (Exercise)  142/78    Blood Pressure (Exit)  102/60    Heart Rate (Admit)  78 bpm    Heart Rate (Exercise)  107 bpm    Heart Rate (Exit)  79 bpm    Rating of Perceived Exertion (Exercise)  15    Symptoms  none    Duration  Progress to 30 minutes of  aerobic without signs/symptoms of physical distress    Intensity  THRR unchanged      Progression   Progression  Continue to progress workloads to maintain intensity without signs/symptoms of physical distress.    Average METs  2      Resistance Training   Training Prescription  Yes    Weight  3lbs    Reps  10-15    Time  10 Minutes      Interval Training   Interval Training  No      Recumbant Bike   Level  2.5    Minutes  10    METs  1.8      NuStep   Level  4    SPM  85    Minutes  10    METs  2.3      Arm Ergometer   Level  --    Minutes  --      Track   Laps  3   walk test: 610ft   Minutes   6    METs  1.9      Home Exercise Plan   Plans to continue exercise at  Home (comment)   walking   Frequency  Add 1 additional day to program exercise sessions.    Initial Home Exercises Provided  11/18/17       Functional Capacity: 6 Minute Walk    Row Name 01/18/18 1528         6 Minute Walk   Phase  Discharge     Distance  622 feet     Distance % Change  26.55 %     Walk Time  4.05 minutes     # of Rest Breaks  3 Rest x 1.95 seconds     MPH  1.18     METS  2.12     RPE  15     Perceived Dyspnea   0     VO2 Peak  7.41     Symptoms  Yes (comment)     Comments  Patient c/o bilateral knee pain, which she rated a "4-5/10" on the pain scale.     Resting HR  78 bpm     Resting BP  120/70     Max Ex. HR  106 bpm     Max Ex. BP  138/80     2 Minute Post BP  102/60        Psychological, QOL, Others - Outcomes: PHQ 2/9: Depression screen [facility] 2/9 02/12/2018 11/10/2017 11/09/2017 11/04/2017 09/04/2017  Decreased Interest 0 0 0 0 0  Down, Depressed, Hopeless 0 0 0 0 0  PHQ - 2 Score 0 0 0 0 0  Altered sleeping - - - - -  Tired, decreased energy - - - - -  Change in appetite - - - - -  Feeling bad or failure about yourself  - - - - -  Trouble concentrating - - - - -  Moving slowly or fidgety/restless - - - - -  Suicidal thoughts - - - - -  PHQ-9 Score - - - - -  Difficult doing work/chores - - - - -  Some recent data might be hidden    Quality of Life:   Personal Goals: Goals established at orientation with interventions provided to work toward goal.    Personal Goals Discharge: Goals and Risk Factor Review    Row Name 12/31/17 1537             Core Components/Risk Factors/Patient Goals Review   Personal Goals Review  Weight Management/Obesity;Diabetes;Hypertension;Lipids       Review  [name]'s vital signs and self reported CBg's have been in the 200's. [name] is doing well with exercise and enjoying partcipating in phase 2 cardiac rehab. [name] continues to use  her rolling walker for stability.       Expected Outcomes  Patient will participate in phase 2 cardiac rehab for exercise. Follow nutrtion and lifestyle opportunities.          Exercise Goals and Review:   Exercise Goals Re-Evaluation: Exercise Goals Re-Evaluation    [place] Name 01/18/18 1534             Exercise Goal Re-Evaluation   Exercise Goals Review  Increase Physical Activity;Able to understand and use rate of perceived exertion (RPE) scale;Knowledge and understanding of Target Heart Rate Range (THRR);Understanding of Exercise Prescription;Able to check pulse independently       Comments  Patient's functional capacity improved 27% as measured by 6MWT, strength improved 10% as measured by grip strength test, and flexibilty improved 9% as measured by sit and reach test. Pt has a stationary bike at home, whcih she plans to use as her mode of home exercise in addition to walking 30 minutes 2 days/week with the goal of achieving 3 days/week.       Expected Outcomes  Patient will ride stationary bike and/or walk 30 minutes 2 days/week to maintain exercise upon completion of cardiac rehab. Pt will eventually increase exercise to 3 days/week.          Nutrition & Weight - Outcomes:  Post Biometrics - 01/18/18 1534       Post  Biometrics   Height  5' 4.5" (1.638 m)    Weight  174 lb 6.1 oz (79.1 kg)    Waist Circumference  41.75 inches    Hip Circumference  44 inches    Waist to Hip Ratio  0.95 %    BMI (  Calculated)  29.48    Triceps Skinfold  23 mm    % Body Fat  40.8 %    Grip Strength  33 kg    Flexibility  15.25 in    Single Leg Stand  0 seconds       Nutrition: Nutrition Therapy & Goals - 02/11/18 1716      Nutrition Therapy   Diet  heart healthy, carb modified      Personal Nutrition Goals   Nutrition Goal  Pt to identify and limit food sources of saturated fat, trans fat, refined carbohydrates and sodium      Intervention Plan   Intervention  Prescribe, educate  and counsel regarding individualized specific dietary modifications aiming towards targeted core components such as weight, hypertension, lipid management, diabetes, heart failure and other comorbidities.    Expected Outcomes  Short Term Goal: Understand basic principles of dietary content, such as calories, fat, sodium, cholesterol and nutrients.;Long Term Goal: Adherence to prescribed nutrition plan.       Nutrition Discharge:   Education Questionnaire Score:   Denys graduated from cardiac rehab program today with completion of 14 exercise sessions in Phase II. Crystalee's attendance was fair to poor .Maciah did increase her distance on post exercise walk test.Hopefully Naiomi will continue to exercise on her own as she did not return to exercise to review her medications and goals.Barnet Pall, RN,BSN 02/18/2018 3:02 PM.

## 2018-02-15 ENCOUNTER — Other Ambulatory Visit: Payer: Self-pay | Admitting: Family Medicine

## 2018-02-17 ENCOUNTER — Ambulatory Visit
Admission: RE | Admit: 2018-02-17 | Discharge: 2018-02-17 | Disposition: A | Discharge status: Home / Self Care | Payer: Medicaid Other | Source: Ambulatory Visit | Attending: Family Medicine | Admitting: Family Medicine

## 2018-02-17 DIAGNOSIS — Z01419 Encounter for gynecological examination (general) (routine) without abnormal findings: Secondary | ICD-10-CM

## 2018-02-25 ENCOUNTER — Other Ambulatory Visit: Payer: Self-pay

## 2018-02-25 ENCOUNTER — Other Ambulatory Visit (HOSPITAL_COMMUNITY)
Admission: RE | Admit: 2018-02-25 | Discharge: 2018-02-25 | Disposition: A | Discharge status: Home / Self Care | Payer: Medicaid Other | Source: Ambulatory Visit | Attending: Family Medicine | Admitting: Family Medicine

## 2018-02-25 ENCOUNTER — Ambulatory Visit (INDEPENDENT_AMBULATORY_CARE_PROVIDER_SITE_OTHER): Payer: Medicaid Other | Admitting: Family Medicine

## 2018-02-25 VITALS — BP 135/82 | HR 67 | Temp 98.7°F | Wt 172.0 lb

## 2018-02-25 DIAGNOSIS — N814 Uterovaginal prolapse, unspecified: Secondary | ICD-10-CM

## 2018-02-25 DIAGNOSIS — Z124 Encounter for screening for malignant neoplasm of cervix: Secondary | ICD-10-CM | POA: Diagnosis present

## 2018-02-25 DIAGNOSIS — Z01419 Encounter for gynecological examination (general) (routine) without abnormal findings: Secondary | ICD-10-CM

## 2018-02-26 NOTE — Assessment & Plan Note (Signed)
Set up GYN referral for fitting of new pessary

## 2018-02-26 NOTE — Assessment & Plan Note (Signed)
Pap obtained Next (and likely final ) pap due at [facility]

## 2018-02-26 NOTE — Progress Notes (Signed)
    CHIEF COMPLAINT / HPI: Needs pap smear as PCP was unable to obtain.Marland Kitchen Postemnopausal for :"many years". No pelvic pain. No postmenopausal bleeding.  Was Rx a pessary several years ago and it has worked well for her but she needs a new one.   REVIEW OF SYSTEMS: See HPI  PERTINENT  PMH / PSH: I have reviewed the patient's medications, allergies, past medical and surgical history, smoking status and updated in the EMR as appropriate.  PUS shows large fibroids predominantly on right side of uterus, also with some uterine and bladder prolapse.  OBJECTIVE: GEN : WD WN NAD GU: externally mild atrophy buy no lesions. Bimanual reveals a large lobulataed uterus, particularly on right. No adnexal masses are noticed. Cervix is seen well on speculum exam. No lesions. Pap obtained ASSESSMENT / PLAN:  No problem-specific Assessment & Plan notes found for this encounter.

## 2018-03-02 LAB — CYTOLOGY - PAP
Diagnosis: NEGATIVE
HPV: NOT DETECTED

## 2018-03-26 ENCOUNTER — Encounter: Payer: Self-pay | Admitting: Family Medicine

## 2018-04-06 ENCOUNTER — Ambulatory Visit: Payer: Medicaid Other | Admitting: Adult Health

## 2018-04-14 ENCOUNTER — Other Ambulatory Visit: Payer: Self-pay | Admitting: *Deleted

## 2018-04-14 ENCOUNTER — Encounter: Payer: Medicaid Other | Admitting: Obstetrics & Gynecology

## 2018-04-14 MED ORDER — ACCU-CHEK NANO SMARTVIEW W/DEVICE KIT: PACK | 0 refills | Status: DC

## 2018-04-14 NOTE — Telephone Encounter (Signed)
Patients meter has broken and needs a new one. Christen Bame, CMA

## 2018-04-29 ENCOUNTER — Other Ambulatory Visit: Payer: Self-pay

## 2018-04-29 ENCOUNTER — Ambulatory Visit (INDEPENDENT_AMBULATORY_CARE_PROVIDER_SITE_OTHER): Payer: Medicaid Other | Admitting: Family Medicine

## 2018-04-29 ENCOUNTER — Encounter: Payer: Self-pay | Admitting: Family Medicine

## 2018-04-29 DIAGNOSIS — G4733 Obstructive sleep apnea (adult) (pediatric): Secondary | ICD-10-CM | POA: Diagnosis not present

## 2018-04-29 NOTE — Progress Notes (Signed)
PATIENT: Holly Acosta DOB: 06-17-1955  REASON FOR VISIT: follow up HISTORY FROM: patient  Virtual Visit via Telephone Note  I connected with [name] on 04/29/18 at 12:45 PM EDT by telephone and verified that I am speaking with the correct person using two identifiers.   I discussed the limitations, risks, security and privacy concerns of performing an evaluation and management service by telephone and the availability of in person appointments. I also discussed with the patient that there may be a patient responsible charge related to this service. The patient expressed understanding and agreed to proceed.   History of Present Illness:  04/29/18 Holly Acosta is a 63 y.o. female for follow up of OSA on BiPAP. She she reports that she is doing well with her BiPAP machine.  She definitely feels that it helps with sleep and her energy the next day.  Download report reveals that she is used her machine 27 out of the last 30 days for compliance of 90%.  63% compliance with use greater than 4 hours.  She averages about 4 hours and 53 minutes.  AHI is 8.1 with EPAP at 18 cm of water and IPAP at 21 cm of water.  History (copied from Dr Dohmeier's note on 12/03/2017)  HPI:  Holly Acosta is a 63 y.o. female patient and seen  on 12-03-2017 upon referral for a sleep evaluation.  Mrs. Vasbinder is a [facility] patient with the resident clinic, she was admitted to [facility] on 27 April 2017 for a cardiac infarct status post stent placement and she has been using aspirin and Brilinta.  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  Half function at the time was an ejection fraction of 55 to 60% with multi regional hypokinesis.   The patient has known diabetes which has been poorly controlled at the time her HbA1c was 13.3.  Additional risk factors for stroke and heart disease are hyperlipidemia, obesity and untreated obstructive sleep apnea.  The patient was seen by Dr. Cari Caraway and then by Dr. Leonie Man on 06 October 2017 HbA1c was now 9.3, still very elevated but improved.  She has a little bit of bruising and bleeding from Brilinta in combination with aspirin.  She is using a walker rehab had been delayed due to transportation problems.  She takes Crestor and her blood pressure has been controlled.    Her previous sleep study 9/20/ 2011 was ordered through Drs. Mulberry and MacPherson, it revealed severe OSA at an AHI of 48/h. CPAP therapy was recommnended. and sleep evaluations were obtained through the [facility] lab. The machine is 63 years old and now  broken. She can't remember who followed her in the [facility] sleep medicine clinic. The study was read by Dr. Baird Lyons.    Chief complaint according to patient :" I may have to go back on CPAP - but I sleep fairly good" and " I easily fall asleep when I am inactive and not stimulated".  Sleep habits are as follows:[place]ner time is around 7 Pm, and she goes to bed most nights between 10 and 11 PM. Has a seperate bedroom from her living room, and retreats at that time. The bedroom is  kept cool, quiet and dark. She sleeps on her back , some times on her left side. She needs 2 pillows and likes the head of bed elevated, sleeps with a fan in the background. She is not dreaming much, she stated.  Reports only one bathroom break around 4-5 AM. Wakes sponatanously between 6 and 7 AM , and she estimates 7-8 hours of sleep each night.  She wakes up refreshed. No headaches in AM- but sometimes with a dry mouth.    She naps in daytime, unscheduled. She lives with 2 granddaughters, who reported her snoring loudly and witnessed her having sleep apnea.    Medical history : already  in record, reviewed.   Family history : Sister is only family member with sleep apnea.   Social history: her 2 granddaughters live at her house , she is divorced. Disability due to knee osteoarthritis, she became disabled 2015 and has yet not received hip or knee surgery . She quit smoking over 20 years ago, but one granddaughter smokes in the house. ETOH- none, caffeine - she drinks coffee in AM , iced tea for lunch and dinner.    Observations/Objective:  Generalized: Well developed, in no acute distress  Mentation: Alert oriented to time, place, history taking. Follows all commands speech and language fluent   Assessment and Plan:  63 y.o. year old female  has a past medical history of Acute cerebrovascular accident (CVA) of cerebellum ([place]) (04/29/2017), Anemia, Arthritis of knee, Coronary artery disease involving native coronary artery of native heart with unstable angina pectoris ([place]) (04/30/2017), Diabetes mellitus, type 2 ([place]), History of blood transfusion, HLD (hyperlipidemia), HTN (hypertension), Non-ST elevation (NSTEMI) myocardial infarction [facility]), Prolapsed uterus, Sleep apnea, and Status post coronary artery stent placement. here with    ICD-10-CM   1. OSA (obstructive sleep apnea) G47.33    She is doing well overall with BiPAP therapy.  I have encouraged her to use her machine nightly and for greater than 4 hours every night.  Her AHI remains elevated, however, I would like to continue monitoring for now. Prior to starting BiPAP therapy AHI was 50.6/h.  I would like for her to follow-up with me in 3 months for another download report.  If apneic events remain elevated we will consider changing pressures at that time.  She was advised to reach out sooner if needed.  She verbalizes understanding and agreement with this plan.  No orders of the defined types were placed in this encounter.   No orders of the defined types were placed in this encounter.    Follow Up  Instructions:  I discussed the assessment and treatment plan with the patient. The patient was provided an opportunity to ask questions and all were answered. The patient agreed with the plan and demonstrated an understanding of the instructions.   The patient was advised to call back or seek an in-person evaluation if the symptoms worsen or if the condition fails to improve as anticipated.  I provided 30 minutes of non-face-to-face time during this encounter.  Patient is located at her place of residence during video conference.  Provider is located at her place of residence.  Liane Comber, RN help to facilitate visit.   Debbora Presto, NP

## 2018-04-30 NOTE — Progress Notes (Signed)
Made any corrections needed, and agree with history, physical, neuro exam,assessment and plan as stated.     Sarina Ill, MD [facility]

## 2018-05-15 ENCOUNTER — Other Ambulatory Visit: Payer: Self-pay | Admitting: Family Medicine

## 2018-05-20 ENCOUNTER — Other Ambulatory Visit: Payer: Self-pay | Admitting: Family Medicine

## 2018-05-25 ENCOUNTER — Other Ambulatory Visit: Payer: Self-pay | Admitting: *Deleted

## 2018-05-26 ENCOUNTER — Telehealth: Payer: Self-pay | Admitting: Cardiology

## 2018-05-26 MED ORDER — TICAGRELOR 90 MG PO TABS: 90.0000 mg | ORAL_TABLET | Freq: Two times a day (BID) | ORAL | 1 refills | Status: DC

## 2018-05-26 NOTE — Telephone Encounter (Signed)
New message   Pt c/o medication issue:  1. Name of Medication: ticagrelor (BRILINTA) 90 MG TABS tablet  2. How are you currently taking this medication (dosage and times per day)?  2 times daily  3. Are you having a reaction (difficulty breathing--STAT)?no   4. What is your medication issue? Patient needs a new prescription sent to [facility] on [address] in [place], [place].

## 2018-05-26 NOTE — Telephone Encounter (Signed)
Pt calls to check status.  She is out of meds.  Christen Bame, CMA

## 2018-05-26 NOTE — Telephone Encounter (Signed)
Brilinta 90mg  bid refilled. #60 R-1.

## 2018-05-27 MED ORDER — ROSUVASTATIN CALCIUM 40 MG PO TABS: 40.0000 mg | ORAL_TABLET | Freq: Every day | ORAL | 3 refills | Status: DC

## 2018-05-31 ENCOUNTER — Telehealth: Payer: Self-pay | Admitting: Physician Assistant

## 2018-05-31 NOTE — Telephone Encounter (Signed)
Smartphone/ consent/ my chart via text/ pre reg completed

## 2018-06-01 ENCOUNTER — Telehealth (INDEPENDENT_AMBULATORY_CARE_PROVIDER_SITE_OTHER): Payer: Medicaid Other | Admitting: Physician Assistant

## 2018-06-01 VITALS — Ht 65.5 in | Wt 175.0 lb

## 2018-06-01 DIAGNOSIS — Z8673 Personal history of transient ischemic attack (TIA), and cerebral infarction without residual deficits: Secondary | ICD-10-CM

## 2018-06-01 DIAGNOSIS — E119 Type 2 diabetes mellitus without complications: Secondary | ICD-10-CM

## 2018-06-01 DIAGNOSIS — I10 Essential (primary) hypertension: Secondary | ICD-10-CM

## 2018-06-01 DIAGNOSIS — E785 Hyperlipidemia, unspecified: Secondary | ICD-10-CM

## 2018-06-01 DIAGNOSIS — I2510 Atherosclerotic heart disease of native coronary artery without angina pectoris: Secondary | ICD-10-CM

## 2018-06-01 NOTE — Patient Instructions (Signed)
Your physician wants you to follow-up in: 6 MONTHS WITH DR JORDAN You will receive a reminder letter in the mail two months in advance. If you don't receive a letter, please call our office to schedule the follow-up appointment.  Your physician recommends that you continue on your current medications as directed. Please refer to the Current Medication list given to you today.  Thank you for choosing [facility]!!   

## 2018-06-01 NOTE — Progress Notes (Signed)
Virtual Visit via Telephone Note   This visit type was conducted due to national recommendations for restrictions regarding the COVID-19 Pandemic (e.g. social distancing) in an effort to limit this patient's exposure and mitigate transmission in our community.  Due to her co-morbid illnesses, this patient is at least at moderate risk for complications without adequate follow up.  This format is felt to be most appropriate for this patient at this time.  The patient did not have access to video technology/had technical difficulties with video requiring transitioning to audio format only (telephone).  All issues noted in this document were discussed and addressed.  No physical exam could be performed with this format.  Please refer to the patient's chart for her  consent to telehealth for [facility].   Date:  06/03/2018   ID:  Holly Acosta, DOB 09-Apr-1955, MRN [id number]  Patient Location: Home Provider Location: Home  PCP:  Daisy Floro, DO  Cardiologist:  Peter Martinique, MD  Electrophysiologist:  None   Evaluation Performed:  Follow-Up Visit  Chief Complaint:  followup  History of Present Illness:    Holly Acosta is a 63 y.o. female with PMH of DM 2, HTN, HLD, OSA, h/o CVA and CAD.  Patient had a cardiac catheterization in 2008 which showed no significant CAD.  She was admitted with NSTEMI in April 2019, troponin was greater than 65.  Cardiac catheterization showed severe stenosis in left circumflex requiring drug-eluting stent.  Postprocedure, she was placed on aspirin and Brilinta.  Hospital course was complicated by acute ischemic left cerebral infarct without hemorrhagic transformation and acute ischemic nonhemorrhagic right cerebellar infarct.  She was seen in the ED in April 2019, she had a reducible ventral hernia at the time however no sign of incarceration.  Patient was contacted today via telephone visit.  She denies any recent exertional chest pain or shortness of breath.   She is doing very well at home and able to take care of herself without any issue.  Otherwise she is still on aspirin and Brilinta.  With the Medicaid, she says her co-pay for the Brilinta is only $3.  I will check with Dr. Martinique to see if he would prefer her to continue on the Brilinta or stop it since she is over 1 year out from stent placement.  Overall she is doing very well from cardiology perspective and may follow-up in 6 months.  The patient does not have symptoms concerning for COVID-19 infection (fever, chills, cough, or new shortness of breath).    Past Medical History:  Diagnosis Date  . Acute cerebrovascular accident (CVA) of cerebellum ([place]) 04/29/2017  . Anemia   . Arthritis of knee    "both knees" (04/24/2017)  . Coronary artery disease involving native coronary artery of native heart with unstable angina pectoris ([place]) 04/30/2017  . Diabetes mellitus, type 2 ([place])   . History of blood transfusion    "related to prolapsed uterus"  . HLD (hyperlipidemia)   . HTN (hypertension)   . Non-ST elevation (NSTEMI) myocardial infarction ([place])   . Prolapsed uterus   . Sleep apnea    "have mask; haven't worn it for a long time" (04/24/2017)  . Status post coronary artery stent placement    Past Surgical History:  Procedure Laterality Date  . CARDIAC CATHETERIZATION  2011  . CORONARY STENT INTERVENTION N/A 04/27/2017   Procedure: CORONARY STENT INTERVENTION;  Surgeon: Jettie Booze, MD;  Location: [place] [facility];  Service:  Cardiovascular;  Laterality: N/A;  . LEFT HEART CATH AND CORONARY ANGIOGRAPHY N/A 04/27/2017   Procedure: LEFT HEART CATH AND CORONARY ANGIOGRAPHY;  Surgeon: Jettie Booze, MD;  Location: [place] [facility];  Service: Cardiovascular;  Laterality: N/A;  . TUBAL LIGATION       Current Meds  Medication Sig  . ACCU-CHEK FASTCLIX LANCETS MISC 1 each by Does not apply route daily. Check sugar daily as needed  . ACCU-CHEK GUIDE test strip TEST BLOOD SUGAR  UP TO 3 TIMES A DAY  . acetaminophen (TYLENOL) 500 MG tablet Take 1,000 mg by mouth every 12 (twelve) hours as needed for mild pain.   Marland Kitchen aspirin EC 81 MG tablet Take 1 tablet (81 mg total) by mouth daily.  . B-D UF III MINI PEN NEEDLES 31G X 5 MM MISC USE AS DIRECTED 3 TIMES A DAY  . Blood Glucose Monitoring Suppl (ACCU-CHEK NANO SMARTVIEW) w/Device KIT Use to test blood sugar up to 3 times a day. ICD-10 code: E11.9  . carvedilol (COREG) 12.5 MG tablet Take 1 tablet (12.5 mg total) by mouth 2 (two) times daily with a meal.  . glucose blood (ACCU-CHEK AVIVA) test strip Use as instructed  . glucose blood (ACCU-CHEK SMARTVIEW) test strip Use as instructed  . insulin aspart (NOVOLOG FLEXPEN) 100 UNIT/ML FlexPen Inject subcutaneously as directed per sliding scale. Sliding scale: 201-250: 2 units; 251-300: 4 units; 301-350: 6 units; 351-400: 8 units.  . Insulin Glargine (LANTUS SOLOSTAR) 100 UNIT/ML Solostar Pen INJECT 32 units into skin dialy  . metFORMIN (GLUCOPHAGE) 1000 MG tablet TAKE 1 TABLET BY MOUTH TWICE DAILY WITH A MEAL  . rosuvastatin (CRESTOR) 40 MG tablet Take 1 tablet (40 mg total) by mouth daily.  . ticagrelor (BRILINTA) 90 MG TABS tablet Take 1 tablet (90 mg total) by mouth 2 (two) times daily.  . [DISCONTINUED] amLODipine (NORVASC) 5 MG tablet Take 1 tablet (5 mg total) by mouth daily.  . [DISCONTINUED] lisinopril (PRINIVIL,ZESTRIL) 20 MG tablet Take 1 tablet (20 mg total) by mouth daily.     Allergies:   Lipitor [atorvastatin] and Diclofenac   Social History   Tobacco Use  . Smoking status: Former Smoker    Packs/day: 1.00    Years: 33.00    Pack years: 33.00    Types: Cigarettes    Start date: 01/20/1973    Last attempt to quit: 11/04/2006    Years since quitting: 11.5  . Smokeless tobacco: Never Used  Substance Use Topics  . Alcohol use: Never    Alcohol/week: 0.0 standard drinks    Frequency: Never  . Drug use: Never     Family Hx: The patient's family history  includes Diabetes in her mother; Hypertension in her father and mother.  ROS:   Please see the history of present illness.     All other systems reviewed and are negative.   Prior CV studies:   The following studies were reviewed today:  Cath 04/27/2017  Prox RCA lesion is 25% stenosed.  Mid RCA to Dist RCA lesion is 25% stenosed.  Prox LAD lesion is 25% stenosed.  The left ventricular systolic function is normal.  LV end diastolic pressure is mildly elevated. LVEDP 18 mm Hg.  The left ventricular ejection fraction is 55-65% by visual estimate.  There is no aortic valve stenosis.  Prox Cx lesion is 99% stenosed. This was the culprit lesion.  A drug-eluting stent was successfully placed using a STENT SIERRA 3.50 X 18 MM.  Post intervention, there is a 0% residual stenosis.   Continue dual antiplatelet therapy for at least one year with aggressive secondary prevention.     I stressed the importance of compliance with her Brilinta.    Echo 04/29/2017 LV EF: 55% -   60% Study Conclusions  - Left ventricle: The cavity size was normal. There was moderate   concentric hypertrophy. Systolic function was normal. The   estimated ejection fraction was in the range of 55% to 60%.   Doppler parameters are consistent with abnormal left ventricular   relaxation (grade 1 diastolic dysfunction). There was no evidence   of elevated ventricular filling pressure by Doppler parameters. - Mitral valve: There was no regurgitation. Valve area by pressure   half-time: 2.08 cm^2. - Left atrium: The atrium was normal in size. - Right ventricle: Systolic function was normal. - Right atrium: The atrium was normal in size. - Tricuspid valve: There was no regurgitation. - Pericardium, extracardiac: The pericardium was normal in   appearance.  Impressions:  - There is hypokinesis of the basal inferior, inferolateral and mid   inferolateral walls, overall LVEF is preserved at 55-60%.   Labs/Other Tests and Data Reviewed:    EKG:  An ECG dated 05/14/2017 was personally reviewed today and demonstrated:  Normal sinus rhythm without significant ST-T wave changes.  Recent Labs: No results found for requested labs within last 8760 hours.   Recent Lipid Panel Lab Results  Component Value Date/Time   CHOL 173 04/29/2017 05:57 AM   TRIG 159 (H) 04/29/2017 05:57 AM   HDL 34 (L) 04/29/2017 05:57 AM   CHOLHDL 5.1 04/29/2017 05:57 AM   LDLCALC 107 (H) 04/29/2017 05:57 AM   LDLDIRECT 135 (H) 12/31/2012 09:49 AM    Wt Readings from Last 3 Encounters:  06/01/18 175 lb (79.4 kg)  02/25/18 172 lb (78 kg)  02/04/18 173 lb 6.4 oz (78.7 kg)     Objective:    Vital Signs:  Ht 5' 5.5" (1.664 m)   Wt 175 lb (79.4 kg)   BMI 28.68 kg/m    VITAL SIGNS:  reviewed  ASSESSMENT & PLAN:    1. CAD: She is 1 year out from her last PCI.  I will discuss with Dr. Martinique to see if her Kary Kos can be discontinued.  She will continue aspirin lifelong.  Overall she is doing very well without any exertional chest discomfort or shortness of breath.  2. Hypertension: She was unable to provide any vital signs today.  Continue on current therapy.  She denies any dizziness, blurred vision or feeling passing out.  3. Hyperlipidemia: On Crestor 40 mg daily  4. DM2: On insulin.  Managed by primary care provider  5. History of CVA: No recent recurrence   COVID-19 Education: The signs and symptoms of COVID-19 were discussed with the patient and how to seek care for testing (follow up with PCP or arrange E-visit).  The importance of social distancing was discussed today.  Time:   Today, I have spent 5 minutes with the patient with telehealth technology discussing the above problems.     Medication Adjustments/Labs and Tests Ordered: Current medicines are reviewed at length with the patient today.  Concerns regarding medicines are outlined above.   Tests Ordered: No orders of the defined types  were placed in this encounter.   Medication Changes: No orders of the defined types were placed in this encounter.   Disposition:  Follow up in 1 month(s)  Signed, Isaac Laud  [place], [place]  06/03/2018 11:05 PM    [facility]

## 2018-06-02 ENCOUNTER — Telehealth: Payer: Medicaid Other | Admitting: Cardiology

## 2018-06-02 ENCOUNTER — Other Ambulatory Visit: Payer: Self-pay

## 2018-06-02 MED ORDER — AMLODIPINE BESYLATE 5 MG PO TABS: 5.0000 mg | ORAL_TABLET | Freq: Every day | ORAL | 3 refills | Status: DC

## 2018-06-02 MED ORDER — LISINOPRIL 20 MG PO TABS: 20.0000 mg | ORAL_TABLET | Freq: Every day | ORAL | 3 refills | Status: DC

## 2018-06-10 ENCOUNTER — Ambulatory Visit (INDEPENDENT_AMBULATORY_CARE_PROVIDER_SITE_OTHER): Payer: Medicaid Other | Admitting: Obstetrics & Gynecology

## 2018-06-10 ENCOUNTER — Encounter: Payer: Self-pay | Admitting: Obstetrics & Gynecology

## 2018-06-10 ENCOUNTER — Other Ambulatory Visit: Payer: Self-pay

## 2018-06-10 VITALS — BP 131/64 | HR 78 | Wt 171.0 lb

## 2018-06-10 DIAGNOSIS — N898 Other specified noninflammatory disorders of vagina: Secondary | ICD-10-CM

## 2018-06-10 DIAGNOSIS — B3731 Acute candidiasis of vulva and vagina: Secondary | ICD-10-CM

## 2018-06-10 DIAGNOSIS — B373 Candidiasis of vulva and vagina: Secondary | ICD-10-CM

## 2018-06-10 MED ORDER — TERCONAZOLE 0.4 % VA CREA: 1.0000 | TOPICAL_CREAM | Freq: Every day | VAGINAL | 0 refills | Status: DC

## 2018-06-10 NOTE — Progress Notes (Signed)
History:  63 y.o. G3P3 here today for pessary check and change. Pt reports that she has had this same pessary for >10 years. She usually removes it monthly. She is s/p MI and during that time she left it ins for ~3 months. Recently she reports that she has swelling in the vagina and is concerned that its because her pessary is so old. She is recently had a Annual GYN exam done by her primary care provider. She denies incontinence. Prior to a few weeks ago, she denies any GYN sx or sx from her pessary.       The following portions of the patient's history were reviewed and updated as appropriate: allergies, current medications, past family history, past medical history, past social history, past surgical history and problem list.  Review of Systems:  Pertinent items are noted in HPI. PAP and mammogram up to date    Objective:  Physical Exam Blood pressure 131/64, pulse 78, weight 171 lb (77.6 kg).  CONSTITUTIONAL: Well-developed, well-nourished female in no acute distress.  HENT:  Normocephalic, atraumatic EYES: Conjunctivae and EOM are normal. No scleral icterus.  NECK: Normal range of motion SKIN: Skin is warm and dry. No rash noted. Not diaphoretic.No pallor. [place]: Alert and oriented to person, place, and time. Normal coordination.   Pelvic: external genitalia- beefy red; minimal discharge. There is redness in the creases of the upper thighs and the labia minora and majora. Pessary removed: no lesions of the vaginal mucosa and cervix. The tissue is intact and healthy but, swollen.  Small uterus- prolapse to the introitus, cystocele,  no other palpable masses, no uterine or adnexal.  tenderness  02/25/2018 Adequacy Satisfactory for evaluation endocervical/transformation zone component PRESENT.   Diagnosis NEGATIVE FOR INTRAEPITHELIAL LESIONS OR MALIGNANCY.   HPV NOT DETECTED   Comment: Normal Reference Range - NOT Detected  Material Submitted CervicoVaginal Pap [ThinPrep Imaged]    CYTOLOGY - PAP PAP RESULT      Assessment & Plan:  Pelvic organ prolapse  Pessary removed, cleaned and replaced  New pessary ordered  F/u in 2 weeks to replace pessary  Vaginitis suspect yeast  F/u Affirm  Terazol 7 cream  Total face-to-face time with patient was 20 min.  Greater than 50% was spent in counseling and coordination of care with the patient.   Pape Parson L. Harraway-Smith, M.D., Cherlynn June

## 2018-06-10 NOTE — Progress Notes (Signed)
Patient would like to be fitted for another pessary. She states she received one several years ago from the [facility] in [place]. Patient states the one she has now is irritating because of the wear of the pessary. Kathrene Alu RN

## 2018-06-11 LAB — CERVICOVAGINAL ANCILLARY ONLY
Bacterial vaginitis: POSITIVE — AB
Candida vaginitis: POSITIVE — AB

## 2018-06-16 ENCOUNTER — Other Ambulatory Visit: Payer: Self-pay | Admitting: Obstetrics & Gynecology

## 2018-06-16 DIAGNOSIS — N760 Acute vaginitis: Secondary | ICD-10-CM

## 2018-06-16 DIAGNOSIS — B3731 Acute candidiasis of vulva and vagina: Secondary | ICD-10-CM

## 2018-06-16 DIAGNOSIS — B373 Candidiasis of vulva and vagina: Secondary | ICD-10-CM

## 2018-06-16 DIAGNOSIS — B9689 Other specified bacterial agents as the cause of diseases classified elsewhere: Secondary | ICD-10-CM

## 2018-06-16 MED ORDER — METRONIDAZOLE 500 MG PO TABS: 500.0000 mg | ORAL_TABLET | Freq: Two times a day (BID) | ORAL | 0 refills | Status: DC

## 2018-06-16 MED ORDER — NYSTATIN-TRIAMCINOLONE 100000-0.1 UNIT/GM-% EX CREA: 1.0000 "application " | TOPICAL_CREAM | Freq: Two times a day (BID) | CUTANEOUS | 0 refills | Status: DC

## 2018-06-16 NOTE — Progress Notes (Signed)
nystatin

## 2018-06-17 ENCOUNTER — Telehealth: Payer: Self-pay

## 2018-06-17 NOTE — Telephone Encounter (Signed)
Patient called and made aware she has yeast infection and bacterial vaginosis. Patient is using the terazol cream and has had some relief. Patient is scheduled to return for new pessary on June 3rd. Patient states she will leave the pessary our during the evenings until that appointment. Patient states she is not comfortable with leaving it out all the time. Kathrene Alu RN

## 2018-06-17 NOTE — Telephone Encounter (Signed)
-----   Message from Lavonia Drafts, MD sent at 06/16/2018  5:26 PM EDT ----- Thanks, She may need boric acid but, lets see if this works first. Please ask her to go without her old pessary until the new one comes in.  Thx, Clh-S ----- Message ----- From: Doran Heater, RN Sent: 06/16/2018   9:36 AM EDT To: Lavonia Drafts, MD  Patient was treated with Terazol 7 but noted in her report it is candida glabrata and has BV.  Wanted you to review if she needs different yeast treatment and what she can have for BV Kathrene Alu RN

## 2018-06-23 ENCOUNTER — Encounter: Payer: Self-pay | Admitting: Obstetrics & Gynecology

## 2018-06-23 ENCOUNTER — Other Ambulatory Visit: Payer: Self-pay

## 2018-06-23 ENCOUNTER — Ambulatory Visit (INDEPENDENT_AMBULATORY_CARE_PROVIDER_SITE_OTHER): Payer: Medicaid Other | Admitting: Obstetrics & Gynecology

## 2018-06-23 VITALS — BP 126/71 | HR 70 | Ht 65.0 in | Wt 172.1 lb

## 2018-06-23 DIAGNOSIS — N814 Uterovaginal prolapse, unspecified: Secondary | ICD-10-CM

## 2018-06-23 NOTE — Progress Notes (Signed)
History:  63 y.o. G3P3 here today for eval of vaginal irritation and replacement of pessary. Pt was seen 2 weeks ago with complaints of vaginal irritation. She has a pessary that has been in place for many years. She had been changing it out but, had some recent medical issues that precluded her changing it. She was treated for BV and Candida glalarata and reports that her sx of irritation are improved.     The following portions of the patient's history were reviewed and updated as appropriate: allergies, current medications, past family history, past medical history, past social history, past surgical history and problem list.  Review of Systems:  Pertinent items are noted in HPI.    Objective:  Physical Exam Blood pressure 126/71, pulse 70, height 5\' 5"  (1.651 m), weight 172 lb 1.3 oz (78.1 kg).  CONSTITUTIONAL: Well-developed, well-nourished female in no acute distress.  HENT:  Normocephalic, atraumatic EYES: Conjunctivae and EOM are normal. No scleral icterus.  NECK: Normal range of motion SKIN: Skin is warm and dry. No rash noted. Not diaphoretic.No pallor. [name]: Alert and oriented to person, place, and time. Normal coordination.   Pelvic: Normal appearing external genitalia; the prev rash is normal appearing vaginal mucosa and cervix.  Normal discharge.  Small uterus, no other palpable masses, no uterine or adnexal tenderness  No prolapse was noted in standing, or in lithotomy position even after Valsalva.  The regular 5 inch with ring support pessary was inserted. (The prev pessary was a #4).    Assessment & Plan:  #4 prev placed today #5 ring with support Pt to f/u in 2 weeks to recheck pessary.  She will f/u sooner prn  Holly Acosta, M.D., Cherlynn June

## 2018-06-24 ENCOUNTER — Encounter: Payer: Self-pay | Admitting: Obstetrics & Gynecology

## 2018-07-01 ENCOUNTER — Ambulatory Visit (INDEPENDENT_AMBULATORY_CARE_PROVIDER_SITE_OTHER): Payer: Medicaid Other | Admitting: Obstetrics & Gynecology

## 2018-07-01 ENCOUNTER — Encounter: Payer: Self-pay | Admitting: Obstetrics & Gynecology

## 2018-07-01 DIAGNOSIS — N814 Uterovaginal prolapse, unspecified: Secondary | ICD-10-CM

## 2018-07-01 NOTE — Progress Notes (Signed)
   TELEHEALTH VIRTUAL GYNECOLOGY VISIT ENCOUNTER NOTE  I connected with Holly Acosta on 07/01/18 at 11:00 AM EDT by telephone at home and verified that I am speaking with the correct person using two identifiers.   I discussed the limitations, risks, security and privacy concerns of performing an evaluation and management service by telephone and the availability of in person appointments. I also discussed with the patient that there may be a patient responsible charge related to this service. The patient expressed understanding and agreed to proceed.   History:  Holly Acosta is a 63 y.o. G3P3 female being evaluated today for pelvic organ prolapse. Pt has pessary replaced 1 week prev. T. She denies any abnormal vaginal discharge, bleeding, pelvic pain or other concerns.  She reports that there have been no issues since the pessary was replaced.    Past Medical History:  Diagnosis Date  . Acute cerebrovascular accident (CVA) of cerebellum ([place]) 04/29/2017  . Anemia   . Arthritis of knee    "both knees" (04/24/2017)  . Coronary artery disease involving native coronary artery of native heart with unstable angina pectoris ([place]) 04/30/2017  . Diabetes mellitus, type 2 ([place])   . History of blood transfusion    "related to prolapsed uterus"  . HLD (hyperlipidemia)   . HTN (hypertension)   . Non-ST elevation (NSTEMI) myocardial infarction ([place])   . Prolapsed uterus   . Sleep apnea    "have mask; haven't worn it for a long time" (04/24/2017)  . Status post coronary artery stent placement    Past Surgical History:  Procedure Laterality Date  . CARDIAC CATHETERIZATION  2011  . CORONARY STENT INTERVENTION N/A 04/27/2017   Procedure: CORONARY STENT INTERVENTION;  Surgeon: Jettie Booze, MD;  Location: [place] [facility];  Service: Cardiovascular;  Laterality: N/A;  . LEFT HEART CATH AND CORONARY ANGIOGRAPHY N/A 04/27/2017   Procedure: LEFT HEART CATH AND CORONARY ANGIOGRAPHY;  Surgeon: Jettie Booze, MD;  Location: [place] [facility];  Service: Cardiovascular;  Laterality: N/A;  . TUBAL LIGATION     The following portions of the patient's history were reviewed and updated as appropriate: allergies, current medications, past family history, past medical history, past social history, past surgical history and problem list.    Physical Exam:   General:  Alert, oriented and cooperative.   Mental Status: Normal mood and affect perceived. Normal judgment and thought content.  Physical exam deferred due to nature of the encounter   Assessment and Plan:     Pelvic organ prolapse- doing well.        I discussed the assessment and treatment plan with the patient. The patient was provided an opportunity to ask questions and all were answered. The patient agreed with the plan and demonstrated an understanding of the instructions.   The patient was advised to call back or seek an in-person evaluation/go to the ED if the symptoms worsen or if the condition fails to improve as anticipated.  I provided 8 minutes of non-face-to-face time during this encounter.   Lavonia Drafts, MD [facility], [place]

## 2018-07-07 LAB — HM DIABETES EYE EXAM

## 2018-07-12 ENCOUNTER — Encounter: Payer: Self-pay | Admitting: Family Medicine

## 2018-07-21 ENCOUNTER — Other Ambulatory Visit: Payer: Self-pay | Admitting: Cardiology

## 2018-07-29 ENCOUNTER — Ambulatory Visit: Payer: Medicaid Other | Admitting: Family Medicine

## 2018-07-30 NOTE — Progress Notes (Signed)
PATIENT: Holly Acosta DOB: Jul 11, 1955  REASON FOR VISIT: follow up HISTORY FROM: patient  Chief Complaint  Patient presents with  . Follow-up    3 mon f/u. Alone. Rm 1. No new concerns at this time.      HISTORY OF PRESENT ILLNESS: Today 08/09/18 Holly Acosta is a 63 y.o. female here today for follow up of OSA on BiPAP.  She is doing well with therapy.  She is using her machine every night.  She reports that she continues to struggle with using it for more than 4 hours each night.  She does feel that this is improving with time.  She denies any concerns with mask.  Compliance download dated 07/06/2018 through 08/04/2018 reveals that she is using her machine 29 of the last 30 days.  On average she used her machine 3 hours and 34 minutes.  AHI was 6.1 with EPAP of 18 cm of water and IPAP of 21 cm of water.  There was no significant leak.  She does note significant improvement in her fatigue.  History (copied from my note on 04/29/2018)  Holly Acosta is a 63 y.o. female for follow up of OSA on BiPAP. She she reports that she is doing well with her BiPAP machine.  She definitely feels that it helps with sleep and her energy the next day.  Download report reveals that she is used her machine 27 out of the last 30 days for compliance of 90%.  63% compliance with use greater than 4 hours.  She averages about 4 hours and 53 minutes.  AHI is 8.1 with EPAP at 18 cm of water and IPAP at 21 cm of water.  History (copied from Dr Dohmeier's note on 12/03/2017)  KGU:RKYH H Turneris a 63 y.o.femalepatientandseen on 12-03-2017 upon referral for a sleep evaluation. Holly Acosta is a [facility] patient with the resident clinic, she was admitted to [facility] on 27 April 2017 for a cardiac infarct status post stent placement and she has been using aspirin and Brilinta. 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 Half function at the time was an ejection fraction of 55 to 60% with multi regional hypokinesis. The patient has known diabetes which has been poorly controlled at the time her HbA1c was 13.3. Additional risk factors for stroke and heart disease are hyperlipidemia, obesity and untreated obstructive sleep apnea. The patient was seen by Dr. Cari Caraway and then by Dr. Leonie Man on 06 October 2017 HbA1c was now 9.3, still very elevated but improved. She has a little bit of bruising and bleeding from Brilinta in combination with aspirin. She is using a walker rehab had been delayed due to transportation problems. She takes Crestor and her blood pressure has been controlled.   Her previous sleep study9/20/ 2011 was ordered through Drs. Mulberry and MacPherson, it revealed severe OSA at an AHI of 48/h. CPAP therapy was recommnended.and sleep evaluations were obtained through the [facility].The machine is 63 years old and now broken. She can't remember who followed her in the [facility] sleep medicine clinic. The study was read by Dr. Baird Lyons.    Chief complaint according to patient :" I may have to go back on CPAP - but  I sleep fairly good" and " I easily fall asleep when I am inactive and not stimulated".  Sleep habits are as follows:Donner time is around 7 Pm, and she goes to bed most nights between 10 and 11 PM. Has a seperate bedroom from her living room, and retreats at that time. The bedroom is kept cool, quiet and dark. She sleeps on her back , some times on her left side. She needs 2 pillows and likes the head of bed elevated, sleeps with a fan in the background. She is not dreaming much, she stated. Reports only one bathroom break around 4-5 AM. Wakes sponatanously between 6 and 7 AM ,  and she estimates 7-8 hours of sleep each night.  She wakes up refreshed. No headaches in AM- but sometimes with a dry mouth.  She naps in daytime, unscheduled. She lives with 2 granddaughters, who reported her snoring loudly and witnessed her having sleep apnea.    Medical history: already in record, reviewed.   Family history : Sister is only family member with sleep apnea.   Social history:her 2 granddaughters live at her house , she is divorced. Disability due to knee osteoarthritis, she became disabled 2015 and has yet not received hip or knee surgery . She quit smoking over 20 years ago, but one granddaughter smokes in the house. ETOH- none, caffeine - she drinks coffee in AM , iced tea for lunch and dinner.   REVIEW OF SYSTEMS: Out of a complete 14 system review of symptoms, the patient complains only of the following symptoms, none and all other reviewed systems are negative.  Epworth sleepiness scale: 10 Fatigue severity scale: 23  ALLERGIES: Allergies  Allergen Reactions  . Lipitor [Atorvastatin] Other (See Comments)    Cramping in feet that resolved after stopping med  . Diclofenac Nausea Only and Other (See Comments)    Caused a lot of stomach pain     HOME MEDICATIONS: Outpatient Medications Prior to Visit  Medication Sig Dispense Refill  . ACCU-CHEK FASTCLIX LANCETS MISC 1 each by Does not apply route daily. Check sugar daily as needed 102 each 3  . ACCU-CHEK GUIDE test strip TEST BLOOD SUGAR UP TO 3 TIMES A DAY 100 each 0  . acetaminophen (TYLENOL) 500 MG tablet Take 1,000 mg by mouth every 12 (twelve) hours as needed for mild pain.     Marland Kitchen amLODipine (NORVASC) 5 MG tablet Take 1 tablet (5 mg total) by mouth daily. 90 tablet 3  . aspirin EC 81 MG tablet Take 1 tablet (81 mg total) by mouth daily. 90 tablet 3  . B-D UF III MINI PEN NEEDLES 31G X 5 MM MISC USE AS DIRECTED 3 TIMES A DAY 100 each 1  . Blood Glucose Monitoring Suppl (ACCU-CHEK NANO SMARTVIEW)  w/Device KIT Use to test blood sugar up to 3 times a day. ICD-10 code: E11.9 1 kit 0  . BRILINTA 90 MG TABS tablet TAKE 1 TABLET BY MOUTH 2 TIMES DAILY. 60 tablet 6  . carvedilol (COREG) 12.5 MG tablet Take 1 tablet (12.5 mg total) by mouth 2 (two) times daily with a meal. 180 tablet 3  . glucose blood (ACCU-CHEK AVIVA) test strip Use as instructed 100 each 12  . glucose blood (ACCU-CHEK SMARTVIEW) test strip Use as instructed 100 each 12  . insulin aspart (NOVOLOG FLEXPEN) 100 UNIT/ML FlexPen Inject subcutaneously as directed per sliding scale. Sliding scale: 201-250: 2 units; 251-300: 4 units; 301-350: 6 units; 351-400: 8 units. 15 pen  2  . Insulin Glargine (LANTUS SOLOSTAR) 100 UNIT/ML Solostar Pen INJECT 32 units into skin dialy 45 pen 3  . lisinopril (ZESTRIL) 20 MG tablet Take 1 tablet (20 mg total) by mouth daily. 90 tablet 3  . metFORMIN (GLUCOPHAGE) 1000 MG tablet TAKE 1 TABLET BY MOUTH TWICE DAILY WITH A MEAL 60 tablet 2  . metroNIDAZOLE (FLAGYL) 500 MG tablet Take 1 tablet (500 mg total) by mouth 2 (two) times daily. 14 tablet 0  . nystatin-triamcinolone (MYCOLOG II) cream Apply 1 application topically 2 (two) times daily. intravaginally 60 g 0  . rosuvastatin (CRESTOR) 40 MG tablet Take 1 tablet (40 mg total) by mouth daily. 90 tablet 3  . terconazole (TERAZOL 7) 0.4 % vaginal cream Place 1 applicator vaginally at bedtime. 45 g 0   No facility-administered medications prior to visit.     PAST MEDICAL HISTORY: Past Medical History:  Diagnosis Date  . Acute cerebrovascular accident (CVA) of cerebellum ([place]) 04/29/2017  . Anemia   . Arthritis of knee    "both knees" (04/24/2017)  . Coronary artery disease involving native coronary artery of native heart with unstable angina pectoris ([place]) 04/30/2017  . Diabetes mellitus, type 2 ([place])   . History of blood transfusion    "related to prolapsed uterus"  . HLD (hyperlipidemia)   . HTN (hypertension)   . Non-ST elevation (NSTEMI)  myocardial infarction ([place])   . Prolapsed uterus   . Sleep apnea    "have mask; haven't worn it for a long time" (04/24/2017)  . Status post coronary artery stent placement     PAST SURGICAL HISTORY: Past Surgical History:  Procedure Laterality Date  . CARDIAC CATHETERIZATION  2011  . CORONARY STENT INTERVENTION N/A 04/27/2017   Procedure: CORONARY STENT INTERVENTION;  Surgeon: Jettie Booze, MD;  Location: [facility];  Service: Cardiovascular;  Laterality: N/A;  . LEFT HEART CATH AND CORONARY ANGIOGRAPHY N/A 04/27/2017   Procedure: LEFT HEART CATH AND CORONARY ANGIOGRAPHY;  Surgeon: Jettie Booze, MD;  Location: [facility];  Service: Cardiovascular;  Laterality: N/A;  . TUBAL LIGATION      FAMILY HISTORY: Family History  Problem Relation Age of Onset  . Diabetes Mother   . Hypertension Mother   . Hypertension Father     SOCIAL HISTORY: Social History   Socioeconomic History  . Marital status: Divorced    Spouse name: Not on file  . Number of children: Not on file  . Years of education: Not on file  . Highest education level: Not on file  Occupational History  . Not on file  Social Needs  . Financial resource strain: Not on file  . Food insecurity    Worry: Not on file    Inability: Not on file  . Transportation needs    Medical: Not on file    Non-medical: Not on file  Tobacco Use  . Smoking status: Former Smoker    Packs/day: 1.00    Years: 33.00    Pack years: 33.00    Types: Cigarettes    Start date: 01/20/1973    Quit date: 11/04/2006    Years since quitting: 11.7  . Smokeless tobacco: Never Used  Substance and Sexual Activity  . Alcohol use: Never    Alcohol/week: 0.0 standard drinks    Frequency: Never  . Drug use: Never  . Sexual activity: Not Currently  Lifestyle  . Physical activity    Days per week: Not on file  Minutes per session: Not on file  . Stress: Not on file  Relationships  . Social Herbalist on  phone: Not on file    Gets together: Not on file    Attends religious service: Not on file    Active member of club or organization: Not on file    Attends meetings of clubs or organizations: Not on file    Relationship status: Not on file  . Intimate partner violence    Fear of current or ex partner: Not on file    Emotionally abused: Not on file    Physically abused: Not on file    Forced sexual activity: Not on file  Other Topics Concern  . Not on file  Social History Narrative   Lives with daughter [name] age 46) and 4 grandchildren.   Disability - Arthritis. Does get disability check.   Does not have a living will yet      PHYSICAL EXAM  Vitals:   08/09/18 1051  BP: 121/67  Pulse: 72  Temp: 97.7 F (36.5 C)  TempSrc: Oral  Weight: 171 lb 3.2 oz (77.7 kg)  Height: _0  (1.651 m)   Body mass index is 28.49 kg/m.  Generalized: Well developed, in no acute distress  Cardiology: normal rate and rhythm, no murmur noted Neurological examination  Mentation: Alert oriented to time, place, history taking. Follows all commands speech and language fluent Cranial nerve II-XII: Pupils were equal round reactive to light. Extraocular movements were full, visual field were full on confrontational test. Facial sensation and strength were normal. Uvula tongue midline. Head turning and shoulder shrug  were normal and symmetric. Motor: The motor testing reveals 5 over 5 strength of all 4 extremities. Good symmetric motor tone is noted throughout.  Coordination: Cerebellar testing reveals good finger-nose-finger and heel-to-shin bilaterally.  Gait and station: Gait is normal.   DIAGNOSTIC DATA (LABS, IMAGING, TESTING) - I reviewed patient records, labs, notes, testing and imaging myself where available.  No flowsheet data found.   Lab Results  Component Value Date   WBC 7.1 05/14/2017   HGB 14.1 05/14/2017   HCT 43.4 05/14/2017   MCV 88.0 05/14/2017   PLT 267 05/14/2017       Component Value Date/Time   NA 135 05/14/2017 1159   NA 136 05/07/2017 1021   K 3.5 05/14/2017 1159   CL 102 05/14/2017 1159   CO2 24 05/14/2017 1159   GLUCOSE 99 05/14/2017 1159   BUN 10 05/14/2017 1159   BUN 19 05/07/2017 1021   CREATININE 1.03 (H) 05/14/2017 1159   CREATININE 0.93 02/01/2015 1640   CALCIUM 8.6 (L) 05/14/2017 1159   PROT 6.3 (L) 05/14/2017 1159   ALBUMIN 2.9 (L) 05/14/2017 1159   AST 26 05/14/2017 1159   ALT 26 05/14/2017 1159   ALKPHOS 68 05/14/2017 1159   BILITOT 0.6 05/14/2017 1159   GFRNONAA 57 (L) 05/14/2017 1159   GFRNONAA 67 02/01/2015 1640   GFRAA >60 05/14/2017 1159   GFRAA 78 02/01/2015 1640   Lab Results  Component Value Date   CHOL 173 04/29/2017   HDL 34 (L) 04/29/2017   LDLCALC 107 (H) 04/29/2017   LDLDIRECT 135 (H) 12/31/2012   TRIG 159 (H) 04/29/2017   CHOLHDL 5.1 04/29/2017   Lab Results  Component Value Date   HGBA1C 9.3 (A) 09/04/2017   No results found for: [id number] Lab Results  Component Value Date   TSH 0.422 04/28/2017    ASSESSMENT AND  PLAN 63 y.o. year old female  has a past medical history of Acute cerebrovascular accident (CVA) of cerebellum ([place]) (04/29/2017), Anemia, Arthritis of knee, Coronary artery disease involving native coronary artery of native heart with unstable angina pectoris ([place]) (04/30/2017), Diabetes mellitus, type 2 ([place]), History of blood transfusion, HLD (hyperlipidemia), HTN (hypertension), Non-ST elevation (NSTEMI) myocardial infarction [facility]), Prolapsed uterus, Sleep apnea, and Status post coronary artery stent placement. here with     ICD-10-CM   1. Severe obstructive sleep apnea-hypopnea syndrome  G47.33     Mrs. Kearl continues to do well with BiPAP therapy.  We have discussed the need for daily compliance as well as using BiPAP for greater than 4 hours each night.  Continue to work on hours used.  AHI has improved slightly although it remains at 6.  I feel that with continued compliance and usage  for greater than 4 hours, AHI will continue to improve.  We will not make any changes today.  She will follow-up in 6 months.  She verbalizes understanding and agreement with this plan.   No orders of the defined types were placed in this encounter.    No orders of the defined types were placed in this encounter.     I spent 15 minutes with the patient. 50% of this time was spent counseling and educating patient on plan of care and medications.    Debbora Presto, FNP-C 08/09/2018, 1:22 PM [facility] [address], [place], [place] [address] [phone]

## 2018-08-03 ENCOUNTER — Encounter: Payer: Self-pay | Admitting: Family Medicine

## 2018-08-09 ENCOUNTER — Other Ambulatory Visit: Payer: Self-pay

## 2018-08-09 ENCOUNTER — Encounter: Payer: Self-pay | Admitting: Family Medicine

## 2018-08-09 ENCOUNTER — Ambulatory Visit: Payer: Medicaid Other | Admitting: Family Medicine

## 2018-08-09 VITALS — BP 121/67 | HR 72 | Temp 97.7°F | Ht 65.0 in | Wt 171.2 lb

## 2018-08-09 DIAGNOSIS — G4733 Obstructive sleep apnea (adult) (pediatric): Secondary | ICD-10-CM

## 2018-08-09 NOTE — Patient Instructions (Signed)
Continue Bipap nightly and for greater than 4 ours each night  Follow up in 6 months  CPAP and BPAP Information CPAP and BPAP are methods of helping a person breathe with the use of air pressure. CPAP stands for "continuous positive airway pressure." BPAP stands for "bi-level positive airway pressure." In both methods, air is blown through your nose or mouth and into your air passages to help you breathe well. CPAP and BPAP use different amounts of pressure to blow air. With CPAP, the amount of pressure stays the same while you breathe in and out. With BPAP, the amount of pressure is increased when you breathe in (inhale) so that you can take larger breaths. Your health care provider will recommend whether CPAP or BPAP would be more helpful for you. Why are CPAP and BPAP treatments used? CPAP or BPAP can be helpful if you have:  Sleep apnea.  Chronic obstructive pulmonary disease (COPD).  Heart failure.  Medical conditions that weaken the muscles of the chest including muscular dystrophy, or neurological diseases such as amyotrophic lateral sclerosis (ALS).  Other problems that cause breathing to be weak, abnormal, or difficult. CPAP is most commonly used for obstructive sleep apnea (OSA) to keep the airways from collapsing when the muscles relax during sleep. How is CPAP or BPAP administered? Both CPAP and BPAP are provided by a small machine with a flexible plastic tube that attaches to a plastic mask. You wear the mask. Air is blown through the mask into your nose or mouth. The amount of pressure that is used to blow the air can be adjusted on the machine. Your health care provider will determine the pressure setting that should be used based on your individual needs. When should CPAP or BPAP be used? In most cases, the mask only needs to be worn during sleep. Generally, the mask needs to be worn throughout the night and during any daytime naps. People with certain medical conditions may  also need to wear the mask at other times when they are awake. Follow instructions from your health care provider about when to use the machine. What are some tips for using the mask?   Because the mask needs to be snug, some people feel trapped or closed-in (claustrophobic) when first using the mask. If you feel this way, you may need to get used to the mask. One way to do this is by holding the mask loosely over your nose or mouth and then gradually applying the mask more snugly. You can also gradually increase the amount of time that you use the mask.  Masks are available in various types and sizes. Some fit over your mouth and nose while others fit over just your nose. If your mask does not fit well, talk with your health care provider about getting a different one.  If you are using a mask that fits over your nose and you tend to breathe through your mouth, a chin strap may be applied to help keep your mouth closed.  The CPAP and BPAP machines have alarms that may sound if the mask comes off or develops a leak.  If you have trouble with the mask, it is very important that you talk with your health care provider about finding a way to make the mask easier to tolerate. Do not stop using the mask. Stopping the use of the mask could have a negative impact on your health. What are some tips for using the machine?  Place your CPAP  or BPAP machine on a secure table or stand near an electrical outlet.  Know where the on/off switch is located on the machine.  Follow instructions from your health care provider about how to set the pressure on your machine and when you should use it.  Do not eat or drink while the CPAP or BPAP machine is on. Food or fluids could get pushed into your lungs by the pressure of the CPAP or BPAP.  Do not smoke. Tobacco smoke residue can damage the machine.  For home use, CPAP and BPAP machines can be rented or purchased through home health care companies. Many  different brands of machines are available. Renting a machine before purchasing may help you find out which particular machine works well for you.  Keep the CPAP or BPAP machine and attachments clean. Ask your health care provider for specific instructions. Get help right away if:  You have redness or open areas around your nose or mouth where the mask fits.  You have trouble using the CPAP or BPAP machine.  You cannot tolerate wearing the CPAP or BPAP mask.  You have pain, discomfort, and bloating in your abdomen. Summary  CPAP and BPAP are methods of helping a person breathe with the use of air pressure.  Both CPAP and BPAP are provided by a small machine with a flexible plastic tube that attaches to a plastic mask.  If you have trouble with the mask, it is very important that you talk with your health care provider about finding a way to make the mask easier to tolerate. This information is not intended to replace advice given to you by your health care provider. Make sure you discuss any questions you have with your health care provider. Document Released: 10/05/2003 Document Revised: 04/28/2018 Document Reviewed: 11/26/2015 Elsevier Patient Education  2020 [employer].

## 2018-08-18 ENCOUNTER — Other Ambulatory Visit: Payer: Self-pay

## 2018-08-18 MED ORDER — BD PEN NEEDLE MINI U/F 31G X 5 MM MISC: 1 refills | Status: DC

## 2018-08-23 ENCOUNTER — Other Ambulatory Visit: Payer: Self-pay

## 2018-08-23 MED ORDER — METFORMIN HCL 1000 MG PO TABS: ORAL_TABLET | ORAL | 2 refills | Status: DC

## 2018-10-17 ENCOUNTER — Other Ambulatory Visit: Payer: Self-pay | Admitting: Family Medicine

## 2018-10-25 ENCOUNTER — Ambulatory Visit (INDEPENDENT_AMBULATORY_CARE_PROVIDER_SITE_OTHER): Payer: Medicaid Other | Admitting: Obstetrics & Gynecology

## 2018-10-25 ENCOUNTER — Other Ambulatory Visit: Payer: Self-pay

## 2018-10-25 ENCOUNTER — Encounter: Payer: Self-pay | Admitting: Obstetrics & Gynecology

## 2018-10-25 VITALS — BP 132/72 | HR 81 | Ht 65.0 in | Wt 170.0 lb

## 2018-10-25 DIAGNOSIS — B373 Candidiasis of vulva and vagina: Secondary | ICD-10-CM

## 2018-10-25 DIAGNOSIS — B3731 Acute candidiasis of vulva and vagina: Secondary | ICD-10-CM

## 2018-10-25 DIAGNOSIS — N814 Uterovaginal prolapse, unspecified: Secondary | ICD-10-CM | POA: Diagnosis not present

## 2018-10-25 MED ORDER — TERCONAZOLE 0.8 % VA CREA: TOPICAL_CREAM | VAGINAL | 1 refills | Status: DC

## 2018-10-25 NOTE — Progress Notes (Signed)
Patient presents for pessary check. Tekela Garguilo RN  

## 2018-10-25 NOTE — Progress Notes (Signed)
  HPI Pt presents today to have pessary removed and cleaned.  She denies problems and reports that her pessary is working very well.  She denies further complaints.     Review of Systems       Objective:   Physical Exam Pt in NAD GU: EGBUS: no lesions; tissue is beefy red and has white patches. c/w yeast.  Vagina: no blood in vault; skin in tact.  Pessary removed and cleaned; vaginal mucosa looks healthy and with no excoriations or breakdown     Assessment:     Pessary check - urogenital prolapse Yeast vaginitis    Plan:     F/u in 3 months to have pessary removed and cleaned terazol cream bid until clear F/u sooner prn   Total face-to-face time with patient was 15 min.  Greater than 50% was spent in counseling and coordination of care with the patient.   Brandom Kerwin L. Harraway-Smith, M.D., [place]nn June

## 2018-10-28 ENCOUNTER — Other Ambulatory Visit: Payer: Self-pay | Admitting: Family Medicine

## 2018-12-15 ENCOUNTER — Other Ambulatory Visit: Payer: Self-pay | Admitting: Family Medicine

## 2018-12-21 ENCOUNTER — Other Ambulatory Visit: Payer: Self-pay | Admitting: *Deleted

## 2018-12-21 DIAGNOSIS — E1165 Type 2 diabetes mellitus with hyperglycemia: Secondary | ICD-10-CM

## 2018-12-22 MED ORDER — NOVOLOG FLEXPEN 100 UNIT/ML ~~LOC~~ SOPN: PEN_INJECTOR | SUBCUTANEOUS | 2 refills | Status: DC

## 2018-12-22 MED ORDER — LANTUS SOLOSTAR 100 UNIT/ML ~~LOC~~ SOPN: PEN_INJECTOR | SUBCUTANEOUS | 3 refills | Status: DC

## 2019-01-31 ENCOUNTER — Ambulatory Visit: Payer: Medicaid Other | Admitting: Obstetrics & Gynecology

## 2019-02-09 ENCOUNTER — Ambulatory Visit: Payer: Medicaid Other | Admitting: Family Medicine

## 2019-02-09 ENCOUNTER — Encounter: Payer: Self-pay | Admitting: Family Medicine

## 2019-02-10 ENCOUNTER — Ambulatory Visit (INDEPENDENT_AMBULATORY_CARE_PROVIDER_SITE_OTHER): Payer: Medicaid Other | Admitting: Family Medicine

## 2019-02-10 ENCOUNTER — Other Ambulatory Visit: Payer: Self-pay

## 2019-02-10 ENCOUNTER — Encounter: Payer: Self-pay | Admitting: Family Medicine

## 2019-02-10 VITALS — BP 118/76 | HR 78 | Temp 96.9°F | Ht 65.0 in | Wt 167.2 lb

## 2019-02-10 DIAGNOSIS — G4733 Obstructive sleep apnea (adult) (pediatric): Secondary | ICD-10-CM | POA: Diagnosis not present

## 2019-02-10 NOTE — Progress Notes (Signed)
PATIENT: Holly Acosta DOB: Jul 03, 1955  REASON FOR VISIT: follow up HISTORY FROM: patient  Chief Complaint  Patient presents with  . Follow-up    6 mon f/u. Alone. Rm. 6. No new concerns at this time.      HISTORY OF PRESENT ILLNESS: Today 02/10/19 THEADORA Acosta is a 64 y.o. female here today for follow up for OSA on BiPAP.  Four hour usage remains sub optimal. She continues to work on meeting 4 hour goal. She admits that sometimes she falls asleep without placing BiPAP and sometimes she pulls her mask off during the night. Otherwise, she is doing very well. She notes benefit of BiPAP therapy.   Compliance report dated 01/10/2019 through 02/08/2019 reveals that she used BiPAP 28 of the last 30 days compliance of 93%.  She used BiPAP greater than 4 hours 57% of the time.  Average usage was 3 hours and 59 minutes.  Residual AHI was 5.4 with IPAP of 21 cm of water and EPAP of 18 cm of water. No significant leak noted.   HISTORY: (copied from my note on 08/09/2018)  Holly Acosta is a 64 y.o. female here today for follow up of OSA on BiPAP.  She is doing well with therapy.  She is using her machine every night.  She reports that she continues to struggle with using it for more than 4 hours each night.  She does feel that this is improving with time.  She denies any concerns with mask.  Compliance download dated 07/06/2018 through 08/04/2018 reveals that she is using her machine 29 of the last 30 days.  On average she used her machine 3 hours and 34 minutes.  AHI was 6.1 with EPAP of 18 cm of water and IPAP of 21 cm of water.  There was no significant leak.  She does note significant improvement in her fatigue.  History (copied from my note on 04/29/2018)  Holly Cruel Turneris a 64 y.o.femalefor follow up of OSA onBiPAP.She she reports that she is doing well with her BiPAP machine. She definitely feels that it helps with sleep and her energy the next day. Download report reveals that she is used  her machine 27 out of the last 30 days for compliance of 90%. 63% compliance with use greater than 4 hours. She averages about 4 hours and 53 minutes. AHI is 8.1 with EPAP at 18 cm of water and IPAP at 21 cm of water.  History (copied from Dr Dohmeier's note on 12/03/2017)  DQQ:IWLN H Turneris a 64 y.o.femalepatientandseen on 12-03-2017 upon referral for a sleep evaluation. Holly Acosta is a [place] [facility] practice patient with the resident clinic, she was admitted to [facility] on 27 April 2017 for a cardiac infarct status post stent placement and she has been using aspirin and Brilinta. 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 Half function at the time was an ejection fraction of 55 to 60% with multi regional hypokinesis. The patient has known diabetes which has been poorly controlled at the time her HbA1c was 13.3. Additional risk factors for stroke and heart disease are hyperlipidemia, obesity and untreated obstructive sleep apnea. The patient was seen by Dr. Cari Caraway and then by Dr. Leonie Man on  06 October 2017 HbA1c was now 9.3, still very elevated but improved. She has a little bit of bruising and bleeding from Brilinta in combination with aspirin. She is using a walker rehab had been delayed due to transportation problems. She takes Crestor and her blood pressure has been controlled.   Her previous sleep study9/20/ 2011 was ordered through Drs. Mulberry and MacPherson, it revealed severe OSA at an AHI of 48/h. CPAP therapy was recommnended.and sleep evaluations were obtained through the [facility].The machine is 64 years old and now broken. She can't remember who followed her in the [facility] sleep [facility]  clinic. The study was read by Dr. Baird Lyons.    Chief complaint according to patient :" I may have to go back on CPAP - but I sleep fairly good" and " I easily fall asleep when I am inactive and not stimulated".  Sleep habits are as follows:Donner time is around 7 Pm, and she goes to bed most nights between 10 and 11 PM. Has a seperate bedroom from her living room, and retreats at that time. The bedroom is kept cool, quiet and dark. She sleeps on her back , some times on her left side. She needs 2 pillows and likes the head of bed elevated, sleeps with a fan in the background. She is not dreaming much, she stated. Reports only one bathroom break around 4-5 AM. Wakes sponatanously between 6 and 7 AM , and she estimates 7-8 hours of sleep each night.  She wakes up refreshed. No headaches in AM- but sometimes with a dry mouth.  She naps in daytime, unscheduled. She lives with 2 granddaughters, who reported her snoring loudly and witnessed her having sleep apnea.    Medical history: already in record, reviewed.   Family history : Sister is only family member with sleep apnea.   Social history:her 2 granddaughters live at her house , she is divorced. Disability due to knee osteoarthritis, she became disabled 2015 and has yet not received hip or knee surgery . She quit smoking over 20 years ago, but one granddaughter smokes in the house. ETOH- none, caffeine - she drinks coffee in AM , iced tea for lunch and dinner   REVIEW OF SYSTEMS: Out of a complete 14 system review of symptoms, the patient complains only of the following symptoms, none and all other reviewed systems are negative.  ALLERGIES: Allergies  Allergen Reactions  . Lipitor [Atorvastatin] Other (See Comments)    Cramping in feet that resolved after stopping med  . Diclofenac Nausea Only and Other (See Comments)    Caused a lot of stomach pain     HOME MEDICATIONS: Outpatient Medications Prior to Visit    Medication Sig Dispense Refill  . ACCU-CHEK FASTCLIX LANCETS MISC 1 each by Does not apply route daily. Check sugar daily as needed 102 each 3  . ACCU-CHEK GUIDE test strip TEST BLOOD SUGAR UP TO 3 TIMES A DAY 100 each 0  . acetaminophen (TYLENOL) 500 MG tablet Take 1,000 mg by mouth every 12 (twelve) hours as needed for mild pain.     Marland Kitchen amLODipine (NORVASC) 5 MG tablet Take 1 tablet (5 mg total) by mouth daily. 90 tablet 3  . aspirin EC 81 MG tablet Take 1 tablet (81 mg total) by mouth daily. 90 tablet 3  . B-D UF III MINI PEN NEEDLES 31G X 5 MM MISC USE AS DIRECTED 3 TIMES A DAY 100 each 2  . Blood Glucose Monitoring Suppl (ACCU-CHEK NANO  SMARTVIEW) w/Device KIT Use to test blood sugar up to 3 times a day. ICD-10 code: E11.9 1 kit 0  . BRILINTA 90 MG TABS tablet TAKE 1 TABLET BY MOUTH 2 TIMES DAILY. 60 tablet 6  . carvedilol (COREG) 12.5 MG tablet TAKE 1 TABLET BY MOUTH 2 TIMES DAILY WITH A MEAL. 180 tablet 3  . glucose blood (ACCU-CHEK AVIVA) test strip Use as instructed 100 each 12  . glucose blood (ACCU-CHEK SMARTVIEW) test strip Use as instructed 100 each 12  . insulin aspart (NOVOLOG FLEXPEN) 100 UNIT/ML FlexPen Inject subcutaneously as directed per sliding scale. Sliding scale: 201-250: 2 units; 251-300: 4 units; 301-350: 6 units; 351-400: 8 units. 15 pen 2  . Insulin Glargine (LANTUS SOLOSTAR) 100 UNIT/ML Solostar Pen INJECT 32 units into skin dialy 45 pen 3  . lisinopril (ZESTRIL) 20 MG tablet Take 1 tablet (20 mg total) by mouth daily. 90 tablet 3  . metFORMIN (GLUCOPHAGE) 1000 MG tablet TAKE 1 TABLET BY MOUTH TWICE A DAY WITH FOOD 60 tablet 2  . nystatin-triamcinolone (MYCOLOG II) cream Apply 1 application topically 2 (two) times daily. intravaginally 60 g 0  . rosuvastatin (CRESTOR) 40 MG tablet Take 1 tablet (40 mg total) by mouth daily. 90 tablet 3  . terconazole (TERAZOL 3) 0.8 % vaginal cream Apply to external vulva twice a day until clear. 60 g 1  . metroNIDAZOLE (FLAGYL) 500  MG tablet Take 1 tablet (500 mg total) by mouth 2 (two) times daily. (Patient not taking: Reported on 10/25/2018) 14 tablet 0  . terconazole (TERAZOL 7) 0.4 % vaginal cream Place 1 applicator vaginally at bedtime. (Patient not taking: Reported on 10/25/2018) 45 g 0   No facility-administered medications prior to visit.    PAST MEDICAL HISTORY: Past Medical History:  Diagnosis Date  . Acute cerebrovascular accident (CVA) of cerebellum ([place]) 04/29/2017  . Anemia   . Arthritis of knee    "both knees" (04/24/2017)  . Coronary artery disease involving native coronary artery of native heart with unstable angina pectoris ([place]) 04/30/2017  . Diabetes mellitus, type 2 ([place])   . History of blood transfusion    "related to prolapsed uterus"  . HLD (hyperlipidemia)   . HTN (hypertension)   . Non-ST elevation (NSTEMI) myocardial infarction ([place])   . Prolapsed uterus   . Sleep apnea    "have mask; haven't worn it for a long time" (04/24/2017)  . Status post coronary artery stent placement     PAST SURGICAL HISTORY: Past Surgical History:  Procedure Laterality Date  . CARDIAC CATHETERIZATION  2011  . CORONARY STENT INTERVENTION N/A 04/27/2017   Procedure: CORONARY STENT INTERVENTION;  Surgeon: Jettie Booze, MD;  Location: [facility];  Service: Cardiovascular;  Laterality: N/A;  . LEFT HEART CATH AND CORONARY ANGIOGRAPHY N/A 04/27/2017   Procedure: LEFT HEART CATH AND CORONARY ANGIOGRAPHY;  Surgeon: Jettie Booze, MD;  Location: [facility];  Service: Cardiovascular;  Laterality: N/A;  . TUBAL LIGATION      FAMILY HISTORY: Family History  Problem Relation Age of Onset  . Diabetes Mother   . Hypertension Mother   . Hypertension Father     SOCIAL HISTORY: Social History   Socioeconomic History  . Marital status: Divorced    Spouse name: Not on file  . Number of children: Not on file  . Years of education: Not on file  . Highest education level: Not on file   Occupational History  . Not on  file  Tobacco Use  . Smoking status: Former Smoker    Packs/day: 1.00    Years: 33.00    Pack years: 33.00    Types: Cigarettes    Start date: 01/20/1973    Quit date: 11/04/2006    Years since quitting: 12.2  . Smokeless tobacco: Never Used  Substance and Sexual Activity  . Alcohol use: Never    Alcohol/week: 0.0 standard drinks  . Drug use: Never  . Sexual activity: Not Currently  Other Topics Concern  . Not on file  Social History Narrative   Lives with daughter [name] age 26) and 4 grandchildren.   Disability - Arthritis. Does get disability check.   Does not have a living will yet   Social Determinants of Health   Financial Resource Strain:   . Difficulty of Paying Living Expenses: Not on file  Food Insecurity:   . Worried About Charity fundraiser in the Last Year: Not on file  . Ran Out of Food in the Last Year: Not on file  Transportation Needs:   . Lack of Transportation (Medical): Not on file  . Lack of Transportation (Non-Medical): Not on file  Physical Activity:   . Days of Exercise per Week: Not on file  . Minutes of Exercise per Session: Not on file  Stress:   . Feeling of Stress : Not on file  Social Connections:   . Frequency of Communication with Friends and Family: Not on file  . Frequency of Social Gatherings with Friends and Family: Not on file  . Attends Religious Services: Not on file  . Active Member of Clubs or Organizations: Not on file  . Attends Archivist Meetings: Not on file  . Marital Status: Not on file  Intimate Partner Violence:   . Fear of Current or Ex-Partner: Not on file  . Emotionally Abused: Not on file  . Physically Abused: Not on file  . Sexually Abused: Not on file      PHYSICAL EXAM  Vitals:   02/10/19 1300  BP: 118/76  Pulse: 78  Temp: (!) 96.9 F (36.1 C)  TempSrc: Oral  Weight: 167 lb 3.2 oz (75.8 kg)  Height: '5\' 5"'$  (1.651 m)   Body mass index is 27.82  kg/m.  Generalized: Well developed, in no acute distress  Cardiology: normal rate and rhythm, no murmur noted Respiratory: Clear to auscultation bilaterlaly Neurological examination  Mentation: Alert oriented to time, place, history taking. Follows all commands speech and language fluent Cranial nerve II-XII: Pupils were equal round reactive to light. Extraocular movements were full, visual field were full on  Motor: The motor testing reveals 5 over 5 strength of all 4 extremities. Good symmetric motor tone is noted throughout.   Gait and station: Gait is stable with single prong cane   DIAGNOSTIC DATA (LABS, IMAGING, TESTING) - I reviewed patient records, labs, notes, testing and imaging myself where available.  No flowsheet data found.   Lab Results  Component Value Date   WBC 7.1 05/14/2017   HGB 14.1 05/14/2017   HCT 43.4 05/14/2017   MCV 88.0 05/14/2017   PLT 267 05/14/2017      Component Value Date/Time   NA 135 05/14/2017 1159   NA 136 05/07/2017 1021   K 3.5 05/14/2017 1159   CL 102 05/14/2017 1159   CO2 24 05/14/2017 1159   GLUCOSE 99 05/14/2017 1159   BUN 10 05/14/2017 1159   BUN 19 05/07/2017 1021   CREATININE  1.03 (H) 05/14/2017 1159   CREATININE 0.93 02/01/2015 1640   CALCIUM 8.6 (L) 05/14/2017 1159   PROT 6.3 (L) 05/14/2017 1159   ALBUMIN 2.9 (L) 05/14/2017 1159   AST 26 05/14/2017 1159   ALT 26 05/14/2017 1159   ALKPHOS 68 05/14/2017 1159   BILITOT 0.6 05/14/2017 1159   GFRNONAA 57 (L) 05/14/2017 1159   GFRNONAA 67 02/01/2015 1640   GFRAA >60 05/14/2017 1159   GFRAA 78 02/01/2015 1640   Lab Results  Component Value Date   CHOL 173 04/29/2017   HDL 34 (L) 04/29/2017   LDLCALC 107 (H) 04/29/2017   LDLDIRECT 135 (H) 12/31/2012   TRIG 159 (H) 04/29/2017   CHOLHDL 5.1 04/29/2017   Lab Results  Component Value Date   HGBA1C 9.3 (A) 09/04/2017   No results found for: [id number] Lab Results  Component Value Date   TSH 0.422 04/28/2017      ASSESSMENT AND PLAN 64 y.o. year old female  has a past medical history of Acute cerebrovascular accident (CVA) of cerebellum ([place]) (04/29/2017), Anemia, Arthritis of knee, Coronary artery disease involving native coronary artery of native heart with unstable angina pectoris ([place]) (04/30/2017), Diabetes mellitus, type 2 ([place]), History of blood transfusion, HLD (hyperlipidemia), HTN (hypertension), Non-ST elevation (NSTEMI) myocardial infarction ([place]), Prolapsed uterus, Sleep apnea, and Status post coronary artery stent placement. here with     ICD-10-CM   1. OSA treated with BiPAP  G47.33     She continues BiPAP therapy. Daily usage is excellent (93%), however, four hour usage remains sub optimal (57%.) Fortunately, residual AHI has improved and now near normal at 5.4/hr. she was encouraged to focus on daily usage with a minimum of 4-hour usage each day.  We have discussed possibility of setting an alarm if she feels that she is going to fall asleep or starting BiPAP therapy while watching TV prior to going to sleep.  We will continue close follow-up.  She will return to see me in 6 months, sooner if needed.  She verbalizes understanding and agreement with this plan.   No orders of the defined types were placed in this encounter.    No orders of the defined types were placed in this encounter.     I spent 15 minutes with the patient. 50% of this time was spent counseling and educating patient on plan of care and medications.    Debbora Presto, FNP-C 02/10/2019, 1:08 PM [facility] [address], [place], [place] [address] [phone]

## 2019-02-10 NOTE — Patient Instructions (Signed)
Continue nightly use of BiPAP. Continue to work toward goal of 4 hour usage every night.   Follow up with me in 6 months, sooner if needed   CPAP and BPAP Information CPAP and BPAP are methods of helping a person breathe with the use of air pressure. CPAP stands for "continuous positive airway pressure." BPAP stands for "bi-level positive airway pressure." In both methods, air is blown through your nose or mouth and into your air passages to help you breathe well. CPAP and BPAP use different amounts of pressure to blow air. With CPAP, the amount of pressure stays the same while you breathe in and out. With BPAP, the amount of pressure is increased when you breathe in (inhale) so that you can take larger breaths. Your health care provider will recommend whether CPAP or BPAP would be more helpful for you. Why are CPAP and BPAP treatments used? CPAP or BPAP can be helpful if you have:  Sleep apnea.  Chronic obstructive pulmonary disease (COPD).  Heart failure.  Medical conditions that weaken the muscles of the chest including muscular dystrophy, or neurological diseases such as amyotrophic lateral sclerosis (ALS).  Other problems that cause breathing to be weak, abnormal, or difficult. CPAP is most commonly used for obstructive sleep apnea (OSA) to keep the airways from collapsing when the muscles relax during sleep. How is CPAP or BPAP administered? Both CPAP and BPAP are provided by a small machine with a flexible plastic tube that attaches to a plastic mask. You wear the mask. Air is blown through the mask into your nose or mouth. The amount of pressure that is used to blow the air can be adjusted on the machine. Your health care provider will determine the pressure setting that should be used based on your individual needs. When should CPAP or BPAP be used? In most cases, the mask only needs to be worn during sleep. Generally, the mask needs to be worn throughout the night and during any  daytime naps. People with certain medical conditions may also need to wear the mask at other times when they are awake. Follow instructions from your health care provider about when to use the machine. What are some tips for using the mask?   Because the mask needs to be snug, some people feel trapped or closed-in (claustrophobic) when first using the mask. If you feel this way, you may need to get used to the mask. One way to do this is by holding the mask loosely over your nose or mouth and then gradually applying the mask more snugly. You can also gradually increase the amount of time that you use the mask.  Masks are available in various types and sizes. Some fit over your mouth and nose while others fit over just your nose. If your mask does not fit well, talk with your health care provider about getting a different one.  If you are using a mask that fits over your nose and you tend to breathe through your mouth, a chin strap may be applied to help keep your mouth closed.  The CPAP and BPAP machines have alarms that may sound if the mask comes off or develops a leak.  If you have trouble with the mask, it is very important that you talk with your health care provider about finding a way to make the mask easier to tolerate. Do not stop using the mask. Stopping the use of the mask could have a negative impact on your health.  What are some tips for using the machine?  Place your CPAP or BPAP machine on a secure table or stand near an electrical outlet.  Know where the on/off switch is located on the machine.  Follow instructions from your health care provider about how to set the pressure on your machine and when you should use it.  Do not eat or drink while the CPAP or BPAP machine is on. Food or fluids could get pushed into your lungs by the pressure of the CPAP or BPAP.  Do not smoke. Tobacco smoke residue can damage the machine.  For home use, CPAP and BPAP machines can be rented or  purchased through home health care companies. Many different brands of machines are available. Renting a machine before purchasing may help you find out which particular machine works well for you.  Keep the CPAP or BPAP machine and attachments clean. Ask your health care provider for specific instructions. Get help right away if:  You have redness or open areas around your nose or mouth where the mask fits.  You have trouble using the CPAP or BPAP machine.  You cannot tolerate wearing the CPAP or BPAP mask.  You have pain, discomfort, and bloating in your abdomen. Summary  CPAP and BPAP are methods of helping a person breathe with the use of air pressure.  Both CPAP and BPAP are provided by a small machine with a flexible plastic tube that attaches to a plastic mask.  If you have trouble with the mask, it is very important that you talk with your health care provider about finding a way to make the mask easier to tolerate. This information is not intended to replace advice given to you by your health care provider. Make sure you discuss any questions you have with your health care provider. Document Revised: 04/28/2018 Document Reviewed: 11/26/2015 Elsevier Patient Education  [place].

## 2019-02-17 ENCOUNTER — Encounter (HOSPITAL_COMMUNITY): Payer: Self-pay

## 2019-02-17 ENCOUNTER — Inpatient Hospital Stay (HOSPITAL_COMMUNITY)
Admission: EM | Admit: 2019-02-17 | Discharge: 2019-02-19 | DRG: 871 | Disposition: A | Discharge status: Home / Self Care | Payer: Medicaid Other | Attending: Family Medicine | Admitting: Family Medicine

## 2019-02-17 ENCOUNTER — Emergency Department (HOSPITAL_COMMUNITY): Payer: Medicaid Other

## 2019-02-17 ENCOUNTER — Other Ambulatory Visit: Payer: Self-pay

## 2019-02-17 DIAGNOSIS — E785 Hyperlipidemia, unspecified: Secondary | ICD-10-CM | POA: Diagnosis present

## 2019-02-17 DIAGNOSIS — Z79899 Other long term (current) drug therapy: Secondary | ICD-10-CM

## 2019-02-17 DIAGNOSIS — I252 Old myocardial infarction: Secondary | ICD-10-CM

## 2019-02-17 DIAGNOSIS — Z87891 Personal history of nicotine dependence: Secondary | ICD-10-CM

## 2019-02-17 DIAGNOSIS — Z6829 Body mass index (BMI) 29.0-29.9, adult: Secondary | ICD-10-CM

## 2019-02-17 DIAGNOSIS — R6520 Severe sepsis without septic shock: Secondary | ICD-10-CM | POA: Diagnosis present

## 2019-02-17 DIAGNOSIS — E669 Obesity, unspecified: Secondary | ICD-10-CM | POA: Diagnosis present

## 2019-02-17 DIAGNOSIS — A419 Sepsis, unspecified organism: Secondary | ICD-10-CM | POA: Diagnosis present

## 2019-02-17 DIAGNOSIS — N179 Acute kidney failure, unspecified: Secondary | ICD-10-CM | POA: Diagnosis present

## 2019-02-17 DIAGNOSIS — K439 Ventral hernia without obstruction or gangrene: Secondary | ICD-10-CM | POA: Diagnosis present

## 2019-02-17 DIAGNOSIS — R197 Diarrhea, unspecified: Secondary | ICD-10-CM | POA: Diagnosis present

## 2019-02-17 DIAGNOSIS — Z955 Presence of coronary angioplasty implant and graft: Secondary | ICD-10-CM

## 2019-02-17 DIAGNOSIS — G4733 Obstructive sleep apnea (adult) (pediatric): Secondary | ICD-10-CM | POA: Diagnosis present

## 2019-02-17 DIAGNOSIS — Z8249 Family history of ischemic heart disease and other diseases of the circulatory system: Secondary | ICD-10-CM

## 2019-02-17 DIAGNOSIS — J9601 Acute respiratory failure with hypoxia: Secondary | ICD-10-CM | POA: Diagnosis present

## 2019-02-17 DIAGNOSIS — M170 Bilateral primary osteoarthritis of knee: Secondary | ICD-10-CM | POA: Diagnosis present

## 2019-02-17 DIAGNOSIS — E119 Type 2 diabetes mellitus without complications: Secondary | ICD-10-CM | POA: Diagnosis present

## 2019-02-17 DIAGNOSIS — J1282 Pneumonia due to coronavirus disease 2019: Secondary | ICD-10-CM | POA: Diagnosis present

## 2019-02-17 DIAGNOSIS — E1159 Type 2 diabetes mellitus with other circulatory complications: Secondary | ICD-10-CM | POA: Diagnosis present

## 2019-02-17 DIAGNOSIS — Z8673 Personal history of transient ischemic attack (TIA), and cerebral infarction without residual deficits: Secondary | ICD-10-CM

## 2019-02-17 DIAGNOSIS — U071 COVID-19: Secondary | ICD-10-CM | POA: Diagnosis present

## 2019-02-17 DIAGNOSIS — Z86010 Personal history of colonic polyps: Secondary | ICD-10-CM

## 2019-02-17 DIAGNOSIS — I2510 Atherosclerotic heart disease of native coronary artery without angina pectoris: Secondary | ICD-10-CM | POA: Diagnosis present

## 2019-02-17 DIAGNOSIS — E860 Dehydration: Secondary | ICD-10-CM | POA: Diagnosis present

## 2019-02-17 DIAGNOSIS — I25110 Atherosclerotic heart disease of native coronary artery with unstable angina pectoris: Secondary | ICD-10-CM | POA: Diagnosis present

## 2019-02-17 DIAGNOSIS — I10 Essential (primary) hypertension: Secondary | ICD-10-CM | POA: Diagnosis present

## 2019-02-17 DIAGNOSIS — Z7902 Long term (current) use of antithrombotics/antiplatelets: Secondary | ICD-10-CM

## 2019-02-17 DIAGNOSIS — A4189 Other specified sepsis: Principal | ICD-10-CM | POA: Diagnosis present

## 2019-02-17 DIAGNOSIS — E1165 Type 2 diabetes mellitus with hyperglycemia: Secondary | ICD-10-CM | POA: Diagnosis present

## 2019-02-17 DIAGNOSIS — Z833 Family history of diabetes mellitus: Secondary | ICD-10-CM

## 2019-02-17 DIAGNOSIS — Z794 Long term (current) use of insulin: Secondary | ICD-10-CM | POA: Diagnosis present

## 2019-02-17 DIAGNOSIS — E1169 Type 2 diabetes mellitus with other specified complication: Secondary | ICD-10-CM | POA: Diagnosis present

## 2019-02-17 DIAGNOSIS — Z7982 Long term (current) use of aspirin: Secondary | ICD-10-CM

## 2019-02-17 LAB — CBG MONITORING, ED: Glucose-Capillary: 297 mg/dL — ABNORMAL HIGH (ref 70–99)

## 2019-02-17 NOTE — ED Provider Notes (Signed)
[facility] EMERGENCY DEPARTMENT Provider Note   CSN: [id number] Arrival date & time: 02/17/19  2303     History Chief Complaint  Patient presents with   Weakness    Holly Acosta is a 64 y.o. female.  The history is provided by the patient and a relative.  Weakness Severity:  Severe Onset quality:  Gradual Duration:  4 days Timing:  Constant Progression:  Worsening Chronicity:  New Relieved by:  Nothing Worsened by:  Nothing Associated symptoms: fever   Associated symptoms: no chest pain, no headaches and no vomiting   Patient with a history of CVA, CAD, diabetes, hypertension presents with generalized weakness.  She reports for several days she has been feeling weak at home.  She has had very little appetite.  She denies any pain complaints.  I spoke to her daughter via phone.  She reports the patient has not felt well for up to 4 days.  She reported feeling that she may have a cold.  She denied any vomiting or diarrhea.  She may have had a COVID-19 exposure  Per EMS, patient was found cyanotic and hypoxic.  Patient also noted to be febrile and given APAP   No falls or injuries reported  Past Medical History:  Diagnosis Date   Acute cerebrovascular accident (CVA) of cerebellum ([place]) 04/29/2017   Anemia    Arthritis of knee    "both knees" (04/24/2017)   Coronary artery disease involving native coronary artery of native heart with unstable angina pectoris ([place]) 04/30/2017   Diabetes mellitus, type 2 ([place])    History of blood transfusion    "related to prolapsed uterus"   HLD (hyperlipidemia)    HTN (hypertension)    Non-ST elevation (NSTEMI) myocardial infarction ([place])    Prolapsed uterus    Sleep apnea    "have mask; haven't worn it for a long time" (04/24/2017)   Status post coronary artery stent placement     Patient Active Problem List   Diagnosis Date Noted   OSA (obstructive sleep apnea) 04/29/2018   Women's annual routine  gynecological examination 02/08/2018   Severe obstructive sleep apnea-hypopnea syndrome 01/01/2018   Cerebrovascular accident (CVA) due to occlusion of left posterior cerebral artery ([place]) 12/03/2017   Snoring 12/03/2017   Excessive daytime sleepiness 12/03/2017   Screening for colon cancer 06/02/2017   Constipation 06/02/2017   Coronary artery disease involving native coronary artery of native heart with unstable angina pectoris ([place]) 04/30/2017   Acute cerebrovascular accident (CVA) of cerebellum ([place]) 04/29/2017   Elevated troponin    Status post coronary artery stent placement    Non-ST elevation (NSTEMI) myocardial infarction ([place])    Chest pain 04/24/2017   Left hip pain 02/23/2014   Obesity 01/29/2012   Osteoarthritis, knee 12/11/2011   COLONIC POLYPS, HYPERPLASTIC 09/13/2009   THYROMEGALY 08/10/2009   Obstructive sleep apnea 08/10/2009   VENTRAL HERNIA 01/26/2009   DEPRESSION 07/07/2008   DM (diabetes mellitus), type 2, uncontrolled ([place]) 05/25/2007   Hyperlipidemia associated with type 2 diabetes mellitus ([place]) 04/02/2007   HYPERTENSION, BENIGN ESSENTIAL 04/02/2007   OA (osteoarthritis) of knee 04/02/2007   Uterine prolapse 01/20/2006    Past Surgical History:  Procedure Laterality Date   CARDIAC CATHETERIZATION  2011   CORONARY STENT INTERVENTION N/A 04/27/2017   Procedure: CORONARY STENT INTERVENTION;  Surgeon: Jettie Booze, MD;  Location: [place] [facility];  Service: Cardiovascular;  Laterality: N/A;   LEFT HEART CATH AND CORONARY ANGIOGRAPHY N/A 04/27/2017  Procedure: LEFT HEART CATH AND CORONARY ANGIOGRAPHY;  Surgeon: Jettie Booze, MD;  Location: [place] [facility];  Service: Cardiovascular;  Laterality: N/A;   TUBAL LIGATION       OB History    Gravida  3   Para  3   Term      Preterm      AB      Living        SAB      TAB      Ectopic      Multiple      Live Births  3        Obstetric Comments    3 pregnancies - All NSVD        Family History  Problem Relation Age of Onset   Diabetes Mother    Hypertension Mother    Hypertension Father     Social History   Tobacco Use   Smoking status: Former Smoker    Packs/day: 1.00    Years: 33.00    Pack years: 33.00    Types: Cigarettes    Start date: 01/20/1973    Quit date: 11/04/2006    Years since quitting: 12.2   Smokeless tobacco: Never Used  Substance Use Topics   Alcohol use: Never    Alcohol/week: 0.0 standard drinks   Drug use: Never    Home Medications Prior to Admission medications   Medication Sig Start Date End Date Taking? Authorizing Provider  ACCU-CHEK FASTCLIX LANCETS MISC 1 each by Does not apply route daily. Check sugar daily as needed 11/04/17   Martyn Malay, MD  ACCU-CHEK GUIDE test strip TEST BLOOD SUGAR UP TO 3 TIMES A DAY 05/20/18   Daisy Floro, DO  acetaminophen (TYLENOL) 500 MG tablet Take 1,000 mg by mouth every 12 (twelve) hours as needed for mild pain.     [provider]  amLODipine (NORVASC) 5 MG tablet Take 1 tablet (5 mg total) by mouth daily. 06/02/18   Martinique, Peter M, MD  aspirin EC 81 MG tablet Take 1 tablet (81 mg total) by mouth daily. 03/30/14   Olam Idler, MD  B-D UF III MINI PEN NEEDLES 31G X 5 MM MISC USE AS DIRECTED 3 TIMES A DAY 10/28/18   Milus Banister C, DO  Blood Glucose Monitoring Suppl (ACCU-CHEK NANO SMARTVIEW) w/Device KIT Use to test blood sugar up to 3 times a day. ICD-10 code: E11.9 04/14/18   Milus Banister C, DO  BRILINTA 90 MG TABS tablet TAKE 1 TABLET BY MOUTH 2 TIMES DAILY. 07/21/18   [place], Peter M, MD  carvedilol (COREG) 12.5 MG tablet TAKE 1 TABLET BY MOUTH 2 TIMES DAILY WITH A MEAL. 10/18/18   Milus Banister C, DO  glucose blood (ACCU-CHEK AVIVA) test strip Use as instructed 05/08/16   Mayo, Pete Pelt, MD  glucose blood (ACCU-CHEK SMARTVIEW) test strip Use as instructed 11/04/17   Martyn Malay, MD  insulin aspart (NOVOLOG FLEXPEN)  100 UNIT/ML FlexPen Inject subcutaneously as directed per sliding scale. Sliding scale: 201-250: 2 units; 251-300: 4 units; 301-350: 6 units; 351-400: 8 units. 12/22/18   Daisy Floro, DO  Insulin Glargine (LANTUS SOLOSTAR) 100 UNIT/ML Solostar Pen INJECT 32 units into skin dialy 12/22/18   Milus Banister C, DO  lisinopril (ZESTRIL) 20 MG tablet Take 1 tablet (20 mg total) by mouth daily. 06/02/18   [place], Peter M, MD  metFORMIN (GLUCOPHAGE) 1000 MG tablet TAKE 1 TABLET BY MOUTH TWICE A  DAY WITH FOOD 12/15/18   Daisy Floro, DO  nystatin-triamcinolone (MYCOLOG II) cream Apply 1 application topically 2 (two) times daily. intravaginally 06/16/18   Lavonia Drafts, MD  rosuvastatin (CRESTOR) 40 MG tablet Take 1 tablet (40 mg total) by mouth daily. 05/27/18   Daisy Floro, DO  terconazole (TERAZOL 3) 0.8 % vaginal cream Apply to external vulva twice a day until clear. 10/25/18   Lavonia Drafts, MD    Allergies    Lipitor [atorvastatin] and Diclofenac  Review of Systems   Review of Systems  Constitutional: Positive for chills and fever.  Cardiovascular: Negative for chest pain.  Gastrointestinal: Negative for vomiting.  Neurological: Positive for weakness. Negative for headaches.  All other systems reviewed and are negative.   Physical Exam Updated Vital Signs BP (!) 90/59 (BP Location: Right Arm)    Pulse 89    Temp (!) 102.6 F (39.2 C) (Rectal)    Resp (!) 33    Ht 1.626 m ('5\' 4"'$ )    Wt 78.5 kg    SpO2 96%    BMI 29.70 kg/m   Physical Exam CONSTITUTIONAL: Ill-appearing HEAD: Normocephalic/atraumatic EYES: EOMI/PERRL ENMT: Mucous membranes dry NECK: supple no meningeal signs SPINE/BACK:entire spine nontender CV: S1/S2 noted, tachycardic LUNGS: Tachypneic and crackles bilaterally ABDOMEN: soft, nontender, no rebound or guarding, bowel sounds noted throughout abdomen GU:no cva tenderness NEURO: Pt is awake/alert, moves all extremitiesx4.  No facial  droop.  No focal weakness noted.  Patient is slow to respond but answers all questions appropriately. EXTREMITIES: pulses normal/equal, full ROM SKIN: warm, color normal PSYCH: no abnormalities of mood noted, alert and oriented to situation ED Results / Procedures / Treatments   Labs (all labs ordered are listed, but only abnormal results are displayed) Labs Reviewed  LACTIC ACID, PLASMA - Abnormal; Notable for the following components:      Result Value   Lactic Acid, Venous 2.1 (*)    All other components within normal limits  COMPREHENSIVE METABOLIC PANEL - Abnormal; Notable for the following components:   Sodium 129 (*)    CO2 15 (*)    Glucose, Bld 318 (*)    Creatinine, Ser 2.25 (*)    Calcium 8.5 (*)    Albumin 2.7 (*)    Alkaline Phosphatase 35 (*)    GFR calc non Af Amer 22 (*)    GFR calc Af Amer 26 (*)    All other components within normal limits  D-DIMER, QUANTITATIVE (NOT AT [facility]) - Abnormal; Notable for the following components:   D-Dimer, Quant 0.58 (*)    All other components within normal limits  LACTATE DEHYDROGENASE - Abnormal; Notable for the following components:   LDH 196 (*)    All other components within normal limits  FERRITIN - Abnormal; Notable for the following components:   Ferritin 308 (*)    All other components within normal limits  FIBRINOGEN - Abnormal; Notable for the following components:   Fibrinogen 740 (*)    All other components within normal limits  C-REACTIVE PROTEIN - Abnormal; Notable for the following components:   CRP 5.9 (*)    All other components within normal limits  CBG MONITORING, ED - Abnormal; Notable for the following components:   Glucose-Capillary 297 (*)    All other components within normal limits  POC SARS CORONAVIRUS 2 AG -  ED - Abnormal; Notable for the following components:   SARS Coronavirus 2 Ag POSITIVE (*)    All other components within  normal limits  CULTURE, BLOOD (ROUTINE X 2)  CULTURE, BLOOD (ROUTINE  X 2)  URINE CULTURE  CBC WITH DIFFERENTIAL/PLATELET  PROCALCITONIN  TRIGLYCERIDES  LACTIC ACID, PLASMA  URINALYSIS, ROUTINE W REFLEX MICROSCOPIC    EKG EKG Interpretation  Date/Time:  Thursday February 17 2019 23:57:21 EST Ventricular Rate:  86 PR Interval:    QRS Duration: 105 QT Interval:  411 QTC Calculation: 492 R Axis:   -20 Text Interpretation: Sinus rhythm Consider left atrial enlargement Left ventricular hypertrophy Borderline prolonged QT interval Baseline wander in lead(s) II III aVF V5 V6 Interpretation limited secondary to artifact Confirmed by Ripley Fraise ([id number]) on 02/18/2019 12:07:04 AM   Radiology DG Chest Port 1 View  Result Date: 02/18/2019 CLINICAL DATA:  Fever, diaphoretic and weakness, unable to communicate EXAM: PORTABLE CHEST 1 VIEW COMPARISON:  Radiograph 04/28/2017 FINDINGS: Multifocal patchy opacities throughout both lungs. No pneumothorax or effusion. No convincing features of edema. Accentuation of the cardiomediastinal contours is likely secondary to low volumes, portable technique and left anterior oblique rotation. The aorta is slightly calcified. No acute osseous or soft tissue abnormality. Degenerative changes are present in the imaged spine and shoulders. IMPRESSION: Multifocal patchy opacities throughout both lungs. Findings worrisome for multifocal pneumonia including atypical infection such as COVID-19. Electronically Signed   By: Lovena Le M.D.   On: 02/18/2019 00:08    Procedures .Critical Care Performed by: Ripley Fraise, MD Authorized by: Ripley Fraise, MD   Critical care provider statement:    Critical care time (minutes):  75   Critical care start time:  02/17/2019 11:30 PM   Critical care end time:  02/18/2019 12:55 AM   Critical care time was exclusive of:  Separately billable procedures and treating other patients   Critical care was necessary to treat or prevent imminent or life-threatening deterioration of the following  conditions:  Respiratory failure, sepsis, shock, dehydration, circulatory failure and metabolic crisis   Critical care was time spent personally by me on the following activities:  Ordering and review of radiographic studies, ordering and review of laboratory studies, ordering and performing treatments and interventions, pulse oximetry, re-evaluation of patient's condition, review of old charts, examination of patient, evaluation of patient's response to treatment, development of treatment plan with patient or surrogate, discussions with consultants and obtaining history from patient or surrogate   I assumed direction of critical care for this patient from another provider in my specialty: no      Medications Ordered in ED Medications  sodium chloride 0.9 % bolus 1,000 mL (1,000 mLs Intravenous New Bag/Given 02/18/19 0041)  cefTRIAXone (ROCEPHIN) 2 g in sodium chloride 0.9 % 100 mL IVPB (2 g Intravenous New Bag/Given 02/18/19 0048)  azithromycin (ZITHROMAX) 500 mg in sodium chloride 0.9 % 250 mL IVPB (500 mg Intravenous New Bag/Given 02/18/19 0044)  lactated ringers bolus 1,000 mL (has no administration in time range)    And  lactated ringers bolus 1,000 mL (1,000 mLs Intravenous New Bag/Given 02/18/19 0032)    And  lactated ringers bolus 500 mL (has no administration in time range)  remdesivir 200 mg in sodium chloride 0.9% 250 mL IVPB (has no administration in time range)    Followed by  remdesivir 100 mg in sodium chloride 0.9 % 100 mL IVPB (has no administration in time range)  dexamethasone (DECADRON) injection 6 mg (6 mg Intravenous Given 02/18/19 0033)    ED Course  I have reviewed the triage vital signs and the nursing notes.  Pertinent labs &  imaging results that were available during my care of the patient were reviewed by me and considered in my medical decision making (see chart for details).    MDM Rules/Calculators/A&P                      12:26 AM Patient is critically ill  with COVID-19.  She has evidence of multifocal pneumonia as well as hypotension.  She is currently 90% on 4 L nasal cannula.  Due to hypotension, code sepsis has been called with IV fluids and IV antibiotics. I updated her daughter via phone. Lab work is pending at this time 12:52 AM Patient with significant dehydration and acute renal failure. Patient is receiving IV fluids. 1:11 AM BP 102/65    Pulse 70    Temp (!) 102.6 F (39.2 C) (Rectal)    Resp (!) 23    Ht 1.626 m ('5\' 4"'$ )    Wt 78.5 kg    SpO2 96%    BMI 29.70 kg/m  Patient appears to be improving with IV fluids. Her blood pressure is now above 100.  She is much more awake and alert. She is on approximately 4 L nasal cannula. I have updated her daughter via phone.  Patient will be admitted to the hospital.  Patient is managed by the family practice service. Patient agreeable with plan 1:18 AM Discussed with the family medicine resident for admission  Holly Acosta was evaluated in Emergency Department on 02/18/2019 for the symptoms described in the history of present illness. She was evaluated in the context of the global COVID-19 pandemic, which necessitated consideration that the patient might be at risk for infection with the SARS-CoV-2 virus that causes COVID-19. Institutional protocols and algorithms that pertain to the evaluation of patients at risk for COVID-19 are in a state of rapid change based on information released by regulatory bodies including the CDC and federal and state organizations. These policies and algorithms were followed during the patient's care in the ED.  Final Clinical Impression(s) / ED Diagnoses Final diagnoses:  Pneumonia due to COVID-19 virus  Acute respiratory failure with hypoxia ([place])  Dehydration  AKI (acute kidney injury) [facility])    Rx / DC Orders ED Discharge Orders    None       Ripley Fraise, MD 02/18/19 [phone]

## 2019-02-17 NOTE — ED Triage Notes (Signed)
Pt bib ems, was found cyanotic, had not eaten in several days, satting 84% ra. Placed on 4L. 101.8*f.  Recent covid exposure with family.   Pressure 118/50 lying, then 86/56. 253 cbg, family reports no eating or drinking.  90 hr.

## 2019-02-18 DIAGNOSIS — E1165 Type 2 diabetes mellitus with hyperglycemia: Secondary | ICD-10-CM | POA: Diagnosis present

## 2019-02-18 DIAGNOSIS — N179 Acute kidney failure, unspecified: Secondary | ICD-10-CM | POA: Diagnosis present

## 2019-02-18 DIAGNOSIS — Z8249 Family history of ischemic heart disease and other diseases of the circulatory system: Secondary | ICD-10-CM | POA: Diagnosis not present

## 2019-02-18 DIAGNOSIS — J9601 Acute respiratory failure with hypoxia: Secondary | ICD-10-CM | POA: Diagnosis present

## 2019-02-18 DIAGNOSIS — I252 Old myocardial infarction: Secondary | ICD-10-CM | POA: Diagnosis not present

## 2019-02-18 DIAGNOSIS — J1282 Pneumonia due to coronavirus disease 2019: Secondary | ICD-10-CM

## 2019-02-18 DIAGNOSIS — R6520 Severe sepsis without septic shock: Secondary | ICD-10-CM | POA: Diagnosis present

## 2019-02-18 DIAGNOSIS — E860 Dehydration: Secondary | ICD-10-CM | POA: Diagnosis present

## 2019-02-18 DIAGNOSIS — Z955 Presence of coronary angioplasty implant and graft: Secondary | ICD-10-CM | POA: Diagnosis not present

## 2019-02-18 DIAGNOSIS — Z87891 Personal history of nicotine dependence: Secondary | ICD-10-CM | POA: Diagnosis not present

## 2019-02-18 DIAGNOSIS — Z7902 Long term (current) use of antithrombotics/antiplatelets: Secondary | ICD-10-CM | POA: Diagnosis not present

## 2019-02-18 DIAGNOSIS — G4733 Obstructive sleep apnea (adult) (pediatric): Secondary | ICD-10-CM | POA: Diagnosis present

## 2019-02-18 DIAGNOSIS — K439 Ventral hernia without obstruction or gangrene: Secondary | ICD-10-CM | POA: Diagnosis present

## 2019-02-18 DIAGNOSIS — Z833 Family history of diabetes mellitus: Secondary | ICD-10-CM | POA: Diagnosis not present

## 2019-02-18 DIAGNOSIS — M170 Bilateral primary osteoarthritis of knee: Secondary | ICD-10-CM | POA: Diagnosis present

## 2019-02-18 DIAGNOSIS — E1169 Type 2 diabetes mellitus with other specified complication: Secondary | ICD-10-CM | POA: Diagnosis present

## 2019-02-18 DIAGNOSIS — Z8673 Personal history of transient ischemic attack (TIA), and cerebral infarction without residual deficits: Secondary | ICD-10-CM | POA: Diagnosis not present

## 2019-02-18 DIAGNOSIS — U071 COVID-19: Secondary | ICD-10-CM

## 2019-02-18 DIAGNOSIS — R197 Diarrhea, unspecified: Secondary | ICD-10-CM | POA: Diagnosis present

## 2019-02-18 DIAGNOSIS — Z794 Long term (current) use of insulin: Secondary | ICD-10-CM | POA: Diagnosis not present

## 2019-02-18 DIAGNOSIS — I10 Essential (primary) hypertension: Secondary | ICD-10-CM | POA: Diagnosis present

## 2019-02-18 DIAGNOSIS — I2510 Atherosclerotic heart disease of native coronary artery without angina pectoris: Secondary | ICD-10-CM | POA: Diagnosis present

## 2019-02-18 DIAGNOSIS — A4189 Other specified sepsis: Secondary | ICD-10-CM | POA: Diagnosis present

## 2019-02-18 DIAGNOSIS — E785 Hyperlipidemia, unspecified: Secondary | ICD-10-CM | POA: Diagnosis present

## 2019-02-18 DIAGNOSIS — Z7982 Long term (current) use of aspirin: Secondary | ICD-10-CM | POA: Diagnosis not present

## 2019-02-18 DIAGNOSIS — A419 Sepsis, unspecified organism: Secondary | ICD-10-CM | POA: Diagnosis present

## 2019-02-18 HISTORY — DX: COVID-19: U07.1

## 2019-02-18 HISTORY — DX: Pneumonia due to coronavirus disease 2019: J12.82

## 2019-02-18 LAB — COMPREHENSIVE METABOLIC PANEL
ALT: 29 U/L (ref 0–44)
ALT: 30 U/L (ref 0–44)
AST: 35 U/L (ref 15–41)
AST: 40 U/L (ref 15–41)
Albumin: 2.7 g/dL — ABNORMAL LOW (ref 3.5–5.0)
Albumin: 2.3 g/dL — ABNORMAL LOW (ref 3.5–5.0)
Alkaline Phosphatase: 35 U/L — ABNORMAL LOW (ref 38–126)
Alkaline Phosphatase: 33 U/L — ABNORMAL LOW (ref 38–126)
Anion gap: 13 (ref 5–15)
Anion gap: 13 (ref 5–15)
BUN: 20 mg/dL (ref 8–23)
BUN: 22 mg/dL (ref 8–23)
CO2: 15 mmol/L — ABNORMAL LOW (ref 22–32)
CO2: 16 mmol/L — ABNORMAL LOW (ref 22–32)
Calcium: 8.5 mg/dL — ABNORMAL LOW (ref 8.9–10.3)
Calcium: 7.9 mg/dL — ABNORMAL LOW (ref 8.9–10.3)
Chloride: 101 mmol/L (ref 98–111)
Chloride: 105 mmol/L (ref 98–111)
Creatinine, Ser: 2.25 mg/dL — ABNORMAL HIGH (ref 0.44–1.00)
Creatinine, Ser: 2.16 mg/dL — ABNORMAL HIGH (ref 0.44–1.00)
GFR calc Af Amer: 26 mL/min — ABNORMAL LOW (ref >60)
GFR calc Af Amer: 27 mL/min — ABNORMAL LOW (ref >60)
GFR calc non Af Amer: 22 mL/min — ABNORMAL LOW (ref >60)
GFR calc non Af Amer: 24 mL/min — ABNORMAL LOW (ref >60)
Glucose, Bld: 318 mg/dL — ABNORMAL HIGH (ref 70–99)
Glucose, Bld: 340 mg/dL — ABNORMAL HIGH (ref 70–99)
Potassium: 3.7 mmol/L (ref 3.5–5.1)
Potassium: 4 mmol/L (ref 3.5–5.1)
Sodium: 129 mmol/L — ABNORMAL LOW (ref 135–145)
Sodium: 134 mmol/L — ABNORMAL LOW (ref 135–145)
Total Bilirubin: 0.6 mg/dL (ref 0.3–1.2)
Total Bilirubin: 0.6 mg/dL (ref 0.3–1.2)
Total Protein: 6.6 g/dL (ref 6.5–8.1)
Total Protein: 5.8 g/dL — ABNORMAL LOW (ref 6.5–8.1)

## 2019-02-18 LAB — FIBRINOGEN: Fibrinogen: 740 mg/dL — ABNORMAL HIGH (ref 210–475)

## 2019-02-18 LAB — C-REACTIVE PROTEIN
CRP: 5.9 mg/dL — ABNORMAL HIGH (ref <1.0)
CRP: 5.7 mg/dL — ABNORMAL HIGH (ref <1.0)

## 2019-02-18 LAB — CBC WITH DIFFERENTIAL/PLATELET
Abs Immature Granulocytes: 0.06 10*3/uL (ref 0.00–0.07)
Abs Immature Granulocytes: 0.08 10*3/uL — ABNORMAL HIGH (ref 0.00–0.07)
Basophils Absolute: 0 10*3/uL (ref 0.0–0.1)
Basophils Absolute: 0 10*3/uL (ref 0.0–0.1)
Basophils Relative: 0 %
Basophils Relative: 0 %
Eosinophils Absolute: 0 10*3/uL (ref 0.0–0.5)
Eosinophils Absolute: 0 10*3/uL (ref 0.0–0.5)
Eosinophils Relative: 0 %
Eosinophils Relative: 0 %
HCT: 38.7 % (ref 36.0–46.0)
HCT: 36.4 % (ref 36.0–46.0)
Hemoglobin: 12.2 g/dL (ref 12.0–15.0)
Hemoglobin: 11.4 g/dL — ABNORMAL LOW (ref 12.0–15.0)
Immature Granulocytes: 1 %
Immature Granulocytes: 1 %
Lymphocytes Relative: 15 %
Lymphocytes Relative: 11 %
Lymphs Abs: 1.1 10*3/uL (ref 0.7–4.0)
Lymphs Abs: 0.9 10*3/uL (ref 0.7–4.0)
MCH: 27.3 pg (ref 26.0–34.0)
MCH: 27.9 pg (ref 26.0–34.0)
MCHC: 31.5 g/dL (ref 30.0–36.0)
MCHC: 31.3 g/dL (ref 30.0–36.0)
MCV: 86.6 fL (ref 80.0–100.0)
MCV: 89.2 fL (ref 80.0–100.0)
Monocytes Absolute: 0.5 10*3/uL (ref 0.1–1.0)
Monocytes Absolute: 0.4 10*3/uL (ref 0.1–1.0)
Monocytes Relative: 7 %
Monocytes Relative: 5 %
Neutro Abs: 5.9 10*3/uL (ref 1.7–7.7)
Neutro Abs: 6.7 10*3/uL (ref 1.7–7.7)
Neutrophils Relative %: 77 %
Neutrophils Relative %: 83 %
Platelets: 190 10*3/uL (ref 150–400)
Platelets: 157 10*3/uL (ref 150–400)
RBC: 4.47 MIL/uL (ref 3.87–5.11)
RBC: 4.08 MIL/uL (ref 3.87–5.11)
RDW: 14 % (ref 11.5–15.5)
RDW: 14.1 % (ref 11.5–15.5)
WBC: 7.7 10*3/uL (ref 4.0–10.5)
WBC: 8.1 10*3/uL (ref 4.0–10.5)
nRBC: 0 % (ref 0.0–0.2)
nRBC: 0 % (ref 0.0–0.2)

## 2019-02-18 LAB — GLUCOSE, CAPILLARY
Glucose-Capillary: 403 mg/dL — ABNORMAL HIGH (ref 70–99)
Glucose-Capillary: 397 mg/dL — ABNORMAL HIGH (ref 70–99)
Glucose-Capillary: 351 mg/dL — ABNORMAL HIGH (ref 70–99)
Glucose-Capillary: 319 mg/dL — ABNORMAL HIGH (ref 70–99)

## 2019-02-18 LAB — POC SARS CORONAVIRUS 2 AG -  ED: SARS Coronavirus 2 Ag: POSITIVE — AB

## 2019-02-18 LAB — HIV ANTIBODY (ROUTINE TESTING W REFLEX): HIV Screen 4th Generation wRfx: NONREACTIVE

## 2019-02-18 LAB — LACTIC ACID, PLASMA
Lactic Acid, Venous: 2.1 mmol/L (ref 0.5–1.9)
Lactic Acid, Venous: 1.5 mmol/L (ref 0.5–1.9)

## 2019-02-18 LAB — PROCALCITONIN: Procalcitonin: 0.1 ng/mL

## 2019-02-18 LAB — LACTATE DEHYDROGENASE: LDH: 196 U/L — ABNORMAL HIGH (ref 98–192)

## 2019-02-18 LAB — HEMOGLOBIN A1C
Hgb A1c MFr Bld: 13.6 % — ABNORMAL HIGH (ref 4.8–5.6)
Mean Plasma Glucose: 343.62 mg/dL

## 2019-02-18 LAB — D-DIMER, QUANTITATIVE
D-Dimer, Quant: 0.58 ug/mL-FEU — ABNORMAL HIGH (ref 0.00–0.50)
D-Dimer, Quant: 0.49 ug/mL-FEU (ref 0.00–0.50)

## 2019-02-18 LAB — TRIGLYCERIDES: Triglycerides: 144 mg/dL (ref <150)

## 2019-02-18 LAB — ABO/RH: ABO/RH(D): O POS

## 2019-02-18 LAB — FERRITIN: Ferritin: 308 ng/mL — ABNORMAL HIGH (ref 11–307)

## 2019-02-18 MED ORDER — INSULIN GLARGINE 100 UNIT/ML ~~LOC~~ SOLN: 15.0000 [IU] | Freq: Every day | SUBCUTANEOUS | Status: DC

## 2019-02-18 MED ORDER — ASPIRIN EC 81 MG PO TBEC
81.0000 mg | DELAYED_RELEASE_TABLET | Freq: Every day | ORAL | Status: DC
  Administered 2019-02-18 – 2019-02-19 (×2): 81 mg via ORAL
  Filled 2019-02-18 (×2): qty 1

## 2019-02-18 MED ORDER — DEXAMETHASONE 6 MG PO TABS
6.0000 mg | ORAL_TABLET | Freq: Every day | ORAL | Status: DC
  Administered 2019-02-18 – 2019-02-19 (×2): 6 mg via ORAL
  Filled 2019-02-18 (×2): qty 1

## 2019-02-18 MED ORDER — LACTATED RINGERS IV BOLUS (SEPSIS): 500.0000 mL | Freq: Once | INTRAVENOUS | Status: DC

## 2019-02-18 MED ORDER — INSULIN ASPART 100 UNIT/ML ~~LOC~~ SOLN: 0.0000 [IU] | SUBCUTANEOUS | Status: DC

## 2019-02-18 MED ORDER — INSULIN GLARGINE 100 UNIT/ML ~~LOC~~ SOLN
20.0000 [IU] | Freq: Every day | SUBCUTANEOUS | Status: DC
  Filled 2019-02-18: qty 0.2

## 2019-02-18 MED ORDER — SODIUM CHLORIDE 0.9 % IV BOLUS (SEPSIS)
1000.0000 mL | Freq: Once | INTRAVENOUS | Status: AC
  Administered 2019-02-18: 01:00:00 1000 mL via INTRAVENOUS

## 2019-02-18 MED ORDER — INSULIN GLARGINE 100 UNIT/ML ~~LOC~~ SOLN
15.0000 [IU] | Freq: Every day | SUBCUTANEOUS | Status: DC
  Filled 2019-02-18: qty 0.15

## 2019-02-18 MED ORDER — POLYETHYLENE GLYCOL 3350 17 G PO PACK: 17.0000 g | PACK | Freq: Every day | ORAL | Status: DC | PRN

## 2019-02-18 MED ORDER — SODIUM CHLORIDE 0.9 % IV SOLN
100.0000 mg | Freq: Every day | INTRAVENOUS | Status: DC
  Administered 2019-02-19: 08:00:00 100 mg via INTRAVENOUS
  Filled 2019-02-18: qty 20

## 2019-02-18 MED ORDER — INSULIN ASPART 100 UNIT/ML ~~LOC~~ SOLN
0.0000 [IU] | Freq: Three times a day (TID) | SUBCUTANEOUS | Status: DC
  Administered 2019-02-18: 10:00:00 9 [IU] via SUBCUTANEOUS

## 2019-02-18 MED ORDER — SODIUM CHLORIDE 0.9 % IV BOLUS
500.0000 mL | Freq: Once | INTRAVENOUS | Status: AC
  Administered 2019-02-18: 500 mL via INTRAVENOUS

## 2019-02-18 MED ORDER — DEXAMETHASONE SODIUM PHOSPHATE 10 MG/ML IJ SOLN: 6.0000 mg | INTRAMUSCULAR | Status: DC

## 2019-02-18 MED ORDER — INSULIN GLARGINE 100 UNIT/ML ~~LOC~~ SOLN
20.0000 [IU] | Freq: Every day | SUBCUTANEOUS | Status: DC
  Administered 2019-02-18 – 2019-02-19 (×2): 20 [IU] via SUBCUTANEOUS
  Filled 2019-02-18 (×2): qty 0.2

## 2019-02-18 MED ORDER — LINAGLIPTIN 5 MG PO TABS
5.0000 mg | ORAL_TABLET | Freq: Every day | ORAL | Status: DC
  Administered 2019-02-18 – 2019-02-19 (×2): 5 mg via ORAL
  Filled 2019-02-18 (×2): qty 1

## 2019-02-18 MED ORDER — SODIUM CHLORIDE 0.9 % IV SOLN
500.0000 mg | INTRAVENOUS | Status: DC
  Administered 2019-02-18: 01:00:00 500 mg via INTRAVENOUS
  Filled 2019-02-18: qty 500

## 2019-02-18 MED ORDER — ROSUVASTATIN CALCIUM 20 MG PO TABS
40.0000 mg | ORAL_TABLET | Freq: Every day | ORAL | Status: DC
  Administered 2019-02-18 – 2019-02-19 (×2): 40 mg via ORAL
  Filled 2019-02-18 (×2): qty 2

## 2019-02-18 MED ORDER — SODIUM CHLORIDE 0.9 % IV SOLN
200.0000 mg | Freq: Once | INTRAVENOUS | Status: AC
  Administered 2019-02-18: 02:00:00 200 mg via INTRAVENOUS
  Filled 2019-02-18: qty 200

## 2019-02-18 MED ORDER — INSULIN ASPART 100 UNIT/ML ~~LOC~~ SOLN
0.0000 [IU] | Freq: Every day | SUBCUTANEOUS | Status: DC
  Administered 2019-02-18: 22:00:00 4 [IU] via SUBCUTANEOUS

## 2019-02-18 MED ORDER — SODIUM CHLORIDE 0.9 % IV SOLN: INTRAVENOUS | Status: DC

## 2019-02-18 MED ORDER — INSULIN ASPART 100 UNIT/ML ~~LOC~~ SOLN
0.0000 [IU] | Freq: Three times a day (TID) | SUBCUTANEOUS | Status: DC
  Administered 2019-02-18 (×2): 11 [IU] via SUBCUTANEOUS
  Administered 2019-02-19: 8 [IU] via SUBCUTANEOUS

## 2019-02-18 MED ORDER — ENOXAPARIN SODIUM 30 MG/0.3ML ~~LOC~~ SOLN: 30.0000 mg | Freq: Every day | SUBCUTANEOUS | Status: DC

## 2019-02-18 MED ORDER — LINAGLIPTIN 5 MG PO TABS: 5.0000 mg | ORAL_TABLET | Freq: Every day | ORAL | Status: DC

## 2019-02-18 MED ORDER — SODIUM CHLORIDE 0.9 % IV SOLN
2.0000 g | INTRAVENOUS | Status: DC
  Administered 2019-02-18: 01:00:00 2 g via INTRAVENOUS
  Filled 2019-02-18: qty 20

## 2019-02-18 MED ORDER — DEXAMETHASONE SODIUM PHOSPHATE 10 MG/ML IJ SOLN
6.0000 mg | Freq: Once | INTRAMUSCULAR | Status: AC
  Administered 2019-02-18: 01:00:00 6 mg via INTRAVENOUS
  Filled 2019-02-18: qty 1

## 2019-02-18 MED ORDER — LACTATED RINGERS IV BOLUS (SEPSIS)
1000.0000 mL | Freq: Once | INTRAVENOUS | Status: AC
  Administered 2019-02-18: 01:00:00 1000 mL via INTRAVENOUS

## 2019-02-18 MED ORDER — HEPARIN SODIUM (PORCINE) 5000 UNIT/ML IJ SOLN
5000.0000 [IU] | Freq: Three times a day (TID) | INTRAMUSCULAR | Status: DC
  Administered 2019-02-18 – 2019-02-19 (×4): 5000 [IU] via SUBCUTANEOUS
  Filled 2019-02-18 (×4): qty 1

## 2019-02-18 MED ORDER — ACETAMINOPHEN 325 MG PO TABS: 650.0000 mg | ORAL_TABLET | Freq: Four times a day (QID) | ORAL | Status: DC | PRN

## 2019-02-18 MED ORDER — LACTATED RINGERS IV BOLUS (SEPSIS): 1000.0000 mL | Freq: Once | INTRAVENOUS | Status: DC

## 2019-02-18 MED ORDER — TICAGRELOR 90 MG PO TABS
90.0000 mg | ORAL_TABLET | Freq: Two times a day (BID) | ORAL | Status: DC
  Administered 2019-02-18 – 2019-02-19 (×3): 90 mg via ORAL
  Filled 2019-02-18 (×4): qty 1

## 2019-02-18 NOTE — Plan of Care (Signed)
  Problem: Education: Goal: Knowledge of risk factors and measures for prevention of condition will improve 02/18/2019 1759 by Linton Flemings, RN Outcome: Progressing 02/18/2019 1051 by Linton Flemings, RN Outcome: Progressing   Problem: Coping: Goal: Psychosocial and spiritual needs will be supported Outcome: Progressing   Problem: Respiratory: Goal: Will maintain a patent airway 02/18/2019 1759 by Linton Flemings, RN Outcome: Progressing 02/18/2019 1051 by Linton Flemings, RN Outcome: Progressing Goal: Complications related to the disease process, condition or treatment will be avoided or minimized 02/18/2019 1759 by Linton Flemings, RN Outcome: Progressing 02/18/2019 1051 by Linton Flemings, RN Outcome: Progressing

## 2019-02-18 NOTE — Plan of Care (Signed)
  Problem: Coping: Goal: Psychosocial and spiritual needs will be supported Outcome: Progressing   Problem: Respiratory: Goal: Will maintain a patent airway Outcome: Progressing   

## 2019-02-18 NOTE — Evaluation (Signed)
Physical Therapy Evaluation Patient Details Name: Holly Acosta MRN: [id number] DOB: [birth year] Today's Date: 02/18/2019   History of Present Illness  Pt adm with acute hypoxic respiratory failure due to covid 19 PNA, acute kidney injury and severe sepsis. PMH - DM, CVA, CAD, HTN  Clinical Impression  Pt able to amb in hall with walker on RA with SpO2 89%. Will follow acutely but don't feel she will need PT at dc.     Follow Up Recommendations No PT follow up    Equipment Recommendations  None recommended by PT    Recommendations for Other Services       Precautions / Restrictions Precautions Precautions: None Restrictions Weight Bearing Restrictions: No      Mobility  Bed Mobility Overal bed mobility: Modified Independent                Transfers Overall transfer level: Needs assistance Equipment used: Rolling walker (2 wheeled) Transfers: Sit to/from Stand Sit to Stand: Supervision         General transfer comment: assist for lines. Pt pulled up on walker  Ambulation/Gait Ambulation/Gait assistance: Supervision Gait Distance (Feet): 175 Feet Assistive device: Rolling walker (2 wheeled) Gait Pattern/deviations: Step-through pattern;Decreased stride length Gait velocity: decr Gait velocity interpretation: 1.31 - 2.62 ft/sec, indicative of limited community ambulator General Gait Details: supervision for lines and to monitor SpO2  Stairs            Wheelchair Mobility    Modified Rankin (Stroke Patients Only)       Balance Overall balance assessment: Needs assistance Sitting-balance support: No upper extremity supported;Feet supported Sitting balance-Leahy Scale: Good     Standing balance support: No upper extremity supported;During functional activity Standing balance-Leahy Scale: Fair                               Pertinent Vitals/Pain Pain Assessment: No/denies pain    Home Living Family/patient expects to be  discharged to:: Private residence Living Arrangements: Other relatives(grandchildren) Available Help at Discharge: Family;Available PRN/intermittently Type of Home: House Home Access: Ramped entrance     Home Layout: One level Home Equipment: Walker - 2 wheels      Prior Function Level of Independence: Independent with assistive device(s)         Comments: Uses rolling walker     Hand Dominance   Dominant Hand: Right    Extremity/Trunk Assessment   Upper Extremity Assessment Upper Extremity Assessment: Defer to OT evaluation    Lower Extremity Assessment Lower Extremity Assessment: Generalized weakness       Communication   Communication: No difficulties  Cognition Arousal/Alertness: Awake/alert Behavior During Therapy: WFL for tasks assessed/performed Overall Cognitive Status: Within Functional Limits for tasks assessed                                        General Comments General comments (skin integrity, edema, etc.): Pt amb on RA with SpO2 89%    Exercises     Assessment/Plan    PT Assessment Patient needs continued PT services  PT Problem List Decreased strength;Decreased balance;Decreased mobility       PT Treatment Interventions DME instruction;Gait training;Functional mobility training;Therapeutic activities;Therapeutic exercise;Balance training;Patient/family education    PT Goals (Current goals can be found in the Care Plan section)  Acute Rehab PT Goals Patient Stated Goal:  return home PT Goal Formulation: With patient Time For Goal Achievement: 02/25/19 Potential to Achieve Goals: Good    Frequency Min 3X/week   Barriers to discharge        Co-evaluation               AM-PAC PT "6 Clicks" Mobility  Outcome Measure Help needed turning from your back to your side while in a flat bed without using bedrails?: None Help needed moving from lying on your back to sitting on the side of a flat bed without using  bedrails?: None Help needed moving to and from a bed to a chair (including a wheelchair)?: A Little Help needed standing up from a chair using your arms (e.g., wheelchair or bedside chair)?: A Little Help needed to walk in hospital room?: A Little Help needed climbing 3-5 steps with a railing? : A Little 6 Click Score: 20    End of Session   Activity Tolerance: Patient tolerated treatment well Patient left: in bed;with call bell/phone within reach;with bed alarm set Nurse Communication: Mobility status;Other (comment)(left O2 off - nurse tech) PT Visit Diagnosis: Muscle weakness (generalized) (M62.81)    Time: 1140-1200 PT Time Calculation (min) (ACUTE ONLY): 20 min   Charges:   PT Evaluation $PT Eval Moderate Complexity: [place] Pager [phone] Office [place] 02/18/2019, 12:10 PM

## 2019-02-18 NOTE — ED Notes (Signed)
SDU  Breakfast ordered  

## 2019-02-18 NOTE — TOC Initial Note (Signed)
Transition of Care [facility]) - Initial/Assessment Note    Patient Details  Name: Holly Acosta MRN: [id number] Date of Birth: 1955/02/11  Transition of Care [facility]) CM/SW Contact:    Maryclare Labrador, RN Phone Number: 02/18/2019, 4:38 PM  Clinical Narrative:  PTA independent from home with family.  Pt informed CM that she uses either a walker or a cane at home - depending on how she feels.  Pt has PCP and denied barriers with paying for discharge meds.  Pt is interested in [facility] at home program.  CM emailed referral form to program.                 Expected Discharge Plan: Home/Self Care Barriers to Discharge: Continued Medical Work up   Patient Goals and CMS Choice        Expected Discharge Plan and Services Expected Discharge Plan: Home/Self Care       Living arrangements for the past 2 months: Single Family Home                                      Prior Living Arrangements/Services Living arrangements for the past 2 months: Single Family Home Lives with:: Relatives Patient language and need for interpreter reviewed:: Yes Do you feel safe going back to the place where you live?: Yes      Need for Family Participation in Patient Care: No (Comment) Care giver support system in place?: Yes (comment) Current home services: DME Criminal Activity/Legal Involvement Pertinent to Current Situation/Hospitalization: No - Comment as needed  Activities of Daily Living      Permission Sought/Granted Permission sought to share information with : (COVID at home program) Permission granted to share information with : Yes, Verbal Permission Granted              Emotional Assessment   Attitude/Demeanor/Rapport: Self-Confident, Charismatic, Gracious, Engaged Affect (typically observed): Accepting Orientation: : Oriented to Self, Oriented to Place, Oriented to  Time, Oriented to Situation   Psych Involvement: No (comment)  Admission diagnosis:  Dehydration  [E86.0] Acute respiratory failure with hypoxia (HCC) [J96.01] AKI (acute kidney injury) ([place]) [N17.9] Pneumonia due to COVID-19 virus [U07.1, J12.82] COVID-19 [U07.1] Patient Active Problem List   Diagnosis Date Noted  . Pneumonia due to COVID-19 virus 02/18/2019  . Severe sepsis ([place]) 02/18/2019  . Acute respiratory failure with hypoxia ([place])   . AKI (acute kidney injury) ([place])   . Dehydration   . OSA (obstructive sleep apnea) 04/29/2018  . Women's annual routine gynecological examination 02/08/2018  . Severe obstructive sleep apnea-hypopnea syndrome 01/01/2018  . Cerebrovascular accident (CVA) due to occlusion of left posterior cerebral artery ([place]) 12/03/2017  . Snoring 12/03/2017  . Excessive daytime sleepiness 12/03/2017  . Screening for colon cancer 06/02/2017  . Constipation 06/02/2017  . Coronary artery disease involving native coronary artery of native heart with unstable angina pectoris ([place]) 04/30/2017  . Acute cerebrovascular accident (CVA) of cerebellum ([place]) 04/29/2017  . Elevated troponin   . Status post coronary artery stent placement   . Non-ST elevation (NSTEMI) myocardial infarction ([place])   . Chest pain 04/24/2017  . Left hip pain 02/23/2014  . Obesity 01/29/2012  . Osteoarthritis, knee 12/11/2011  . COLONIC POLYPS, HYPERPLASTIC 09/13/2009  . THYROMEGALY 08/10/2009  . Obstructive sleep apnea 08/10/2009  . VENTRAL HERNIA 01/26/2009  . DEPRESSION 07/07/2008  . DM (diabetes mellitus), type 2, uncontrolled ([place]) 05/25/2007  .  Hyperlipidemia associated with type 2 diabetes mellitus ([place]) 04/02/2007  . HYPERTENSION, BENIGN ESSENTIAL 04/02/2007  . OA (osteoarthritis) of knee 04/02/2007  . Uterine prolapse 01/20/2006   PCP:  Daisy Floro, DO Pharmacy:   [id number] - [place], [place] [address] Phone: [phone] Fax: [phone]     Social Determinants  of Health (SDOH) Interventions    Readmission Risk Interventions No flowsheet data found.

## 2019-02-18 NOTE — H&P (Addendum)
[facility] Admission History and Physical Service Pager: [phone]  Patient name: Holly Acosta record number: [id number] Date of birth: [birth year] Age: 64 y.o. Gender: female  Primary Care Provider: Daisy Floro, DO Consultants: None Code Status: Full Code  Preferred Emergency Contact: Daughter, Amy [phone])  Chief Complaint: weakness, general malaise   Assessment and Plan: Holly Acosta is a 64 y.o. female presenting with acute hypoxic respiratory failure in setting of COVID PNA. PMH is significant for T2DM, HTN, HLD, OSA, prior CVA and CAD.   Sepsis 2/2 COVID PNA  Patient presents via EMS after being found cyanotic by family members and hypoxic to 86%. Patient had a granddaughter test positive for COVID-19 2 days ago. The patient reports feeling weak for ~1 week and endorses flu symptoms including fever, chills, weakness, denies any cough, dyspnea or SOB. Upon arrival to ED, CXR was notable for multifocal PNA concerning for COVID-19, was treated with one time CTX and azithromycin for CAP coverage in ED. Patient now confirmed COVID+. In ED patient required 4L O2 via [place] to maintain adequate oxygenation. Patient initially met sepsis criteria with temp 102, RR 28, and BP 86/58. QSOFA 2, making patient high risk. 4C score elevated to 10, placing patient at high risk for covid related mortality (31.4-34.9 in hospital mortality). Patient's vital did improve with fluid hydration and BP stabalized. Labs notable for LA 2.1, wbc wnl at 7.7, fibrinogen 740, ferritin 308, LDH 196, procalcitonin 0.10, D-dimer elevated at 0.58, CRP 5.9.  CMP notable for sodium 129 (132 when corrected for hyperglycemia), CO2 low at 15, glucose elevated at 318>297. Patient was also hypotensive with BP 90/53 and RR of 35. Patient's alertness improved after MIVFs and patient is fully alert and oriented x4 on exam. Pulmonary exam with diffuse, bilateral crackles. Patient was on 4  liters via [place] with no use of supplemental oxygen at home. CXR with mulitifocal PNA. The most likely contributing factor to her current disease presentation is infection with COVID PNA. Today would be the 8th day of symptoms. Other etiologies for sepis include bacterial PNA, pulmonary edema and PE(1.5 well's score makes this less likely). Will trend d-dimer & CRP given covid status and monitor respiratory status in consideration of other potential diagnoses on differential. Will admit for supplemental oxygenation,continued treatment with remdesivir and decadron as well as MIVFs for acute renal failure as detailed below.  -admit to progressive, attending Dr. Owens Shark  -airborne and contact precautions -continue MIVFs @ rate of 164m/hr -decadron '6mg'$    -remdesivir IV  -azithromycin (1/29-) & CTX (1/29-) -AM CBC, CMP -daily d dimer and CRP -trend LA -AM EKG  -supplemental oxygen PRN with goal >92%  -continuous pulse oximetry  -cardiac monitoring  -f/u blood & urine cultures (urine will be collected after abx administered)  -incentive spirometry  -HIV, U/A -Tylenol & Miralax PRN  -up with assistance  -vitals q 2 hours  -PT/OT -prone 16hrs/day  AKI Cr elevated at 2.25 on admission with normal baseline. Acute renal failure is likely due to severe dehydration as patient was without PO intake for several days prior to initial presentation to ED. On exam, demonstrates dry mucous membranes and delayed capillary refill.  -MIVF NS @ 1213mhr -monitor with CMP -U/A -avoid nephrotoxic meds  T2DM  Hyperglycemia  Home insulin regimen includes lantus 32 units daily, metformin 1000 BID. Exenatide '2mg'$  weekly listed in PCP notes, but not in current med list. Of note has not seen PCP for diabetes management  since 10/2017. On admission, BG 348, most recently 297. Last hemoglobin A1c 02/10/19 9.3.  -sSSI -CBG 4 times daily  -start Lantus at 15 units  -hemoglobin A1c  -consult to diabetes coordinator   -clarify outpatient diabetes regimen -start liraglutide given evidence for COVID  HTN Home meds amlodipine 5, lisinopril '20mg'$ , carvedilol 12.5 mg bid. Patient noted to initially be hypotensive when presenting to ED.  -monitor BP with vitals q 4 hours  -hold home meds, will restart when BP stabilizes and if elevated  -will hold lisinopril given acute renal failure   OSA on BiPAP Patient noted to use BiPAP nightly at home with 93% compliance record as of 02/10/19. -patient will be unable to use BiPAP as she is COVID + -will continue to monitor pulse oxygenation overnight with vitals and continuous pulse ox   HLD Last cholesterol panel in 4/19 with cholesterol 173, HDL 34, LDL 107. 02/17/19 TG 144. Home meds include rosuvastatin 40 daily.  -continue home rosuvastatin   CAD Hx of NSTEMI in 04/2017  Hx of PCI placement in April of 2019 for NSTEMI. On admission, patient denies chest pain. EKG shows normal sinus rhythm, no ST elevation, QTc of 492. Home meds ASA '81mg'$  & Brilinta 90.  -continue home ASA 81  -hold brilinta  -AM EKG   Hx of Prior CVA, 04/2017  Following PCI procedure, patient was drowsy and found to have a large left-sided posterior inferior cerebellar arterial infarction in 2019. No neurological deficits on exam. Appears to follow with [facility] as outpatient.  -continue ASA  -hold Brilinta   FEN/GI: carb modified  Prophylaxis: Lovenox   Disposition: admit to progressive   History of Present Illness:  Holly Acosta is a 64 y.o. female presenting after being found cyanotic and hypoxic to 86% via EMS. PMH significant for T2DM, HTN, HLD, OSA, prior CVA and CAD.   Patient reports feeling unwell for about 1 week states that she was recently exposed to COVID-19 as her granddaughter tested positive. Today, the patient was found cyanotic by her grandchildren while at home and EMS found that her oxygen saturation was 86%. Patient denies cough, SOB, pain with  taking a deep breath, sore throat, HA, body aches but does report feeling like she had the flu for the last week. She reports her symptoms for the past week have included decreased appetite, generalized weakness, an episode of diarrhea 2 days ago, fever, chills and dizziness. Patient has not been eating or drinking for the past couple of days because she "just did not feel like it". Ms. Mccall reports that she has been taking her medications throughout this last week but did not have them last night. Patient has been taking her insulin regularly as well but not last night.   Patient lives with her grandchildren, one is 52 and one is 72 and one 84. Denies current tobacco use, quit >10 years ago. Denies ETOH use. Denies illicit drug use. At [facility], patient uses a cane/wheelchair for assistance with ambulation.    In the ED, she was found to be COVID +, febrile to 102.6, tachypnic 35, hypotensive 90/53, requiring 4 liters via [place]. Her CXR was consistent with COVID-19 infection as it is reported to have multifocal PNA. She was subsequently treated with remdesivir, decadron, azithromycin, CTX and two liters of LR and NS via IVFs.   Review Of Systems: Per HPI with the following additions:   Review of Systems  Constitutional: Positive for chills, fever and malaise/fatigue.  HENT: Negative for sore throat.   Respiratory: Negative for cough and shortness of breath.   Cardiovascular: Negative for chest pain and palpitations.  Gastrointestinal: Positive for diarrhea and nausea. Negative for abdominal pain, blood in stool, constipation and vomiting.  Musculoskeletal: Negative for myalgias.  Neurological: Positive for dizziness. Negative for loss of consciousness and headaches.    Patient Active Problem List   Diagnosis Date Noted  . Pneumonia due to COVID-19 virus 02/18/2019  . Acute respiratory failure with hypoxia ([place])   . AKI (acute kidney injury) ([place])   . Dehydration   . OSA (obstructive sleep apnea)  04/29/2018  . Women's annual routine gynecological examination 02/08/2018  . Severe obstructive sleep apnea-hypopnea syndrome 01/01/2018  . Cerebrovascular accident (CVA) due to occlusion of left posterior cerebral artery ([place]) 12/03/2017  . Snoring 12/03/2017  . Excessive daytime sleepiness 12/03/2017  . Screening for colon cancer 06/02/2017  . Constipation 06/02/2017  . Coronary artery disease involving native coronary artery of native heart with unstable angina pectoris ([place]) 04/30/2017  . Acute cerebrovascular accident (CVA) of cerebellum ([place]) 04/29/2017  . Elevated troponin   . Status post coronary artery stent placement   . Non-ST elevation (NSTEMI) myocardial infarction ([place])   . Chest pain 04/24/2017  . Left hip pain 02/23/2014  . Obesity 01/29/2012  . Osteoarthritis, knee 12/11/2011  . COLONIC POLYPS, HYPERPLASTIC 09/13/2009  . THYROMEGALY 08/10/2009  . Obstructive sleep apnea 08/10/2009  . VENTRAL HERNIA 01/26/2009  . DEPRESSION 07/07/2008  . DM (diabetes mellitus), type 2, uncontrolled ([place]) 05/25/2007  . Hyperlipidemia associated with type 2 diabetes mellitus ([place]) 04/02/2007  . HYPERTENSION, BENIGN ESSENTIAL 04/02/2007  . OA (osteoarthritis) of knee 04/02/2007  . Uterine prolapse 01/20/2006    Past Medical History: Past Medical History:  Diagnosis Date  . Acute cerebrovascular accident (CVA) of cerebellum ([place]) 04/29/2017  . Anemia   . Arthritis of knee    "both knees" (04/24/2017)  . Coronary artery disease involving native coronary artery of native heart with unstable angina pectoris ([place]) 04/30/2017  . Diabetes mellitus, type 2 ([place])   . History of blood transfusion    "related to prolapsed uterus"  . HLD (hyperlipidemia)   . HTN (hypertension)   . Non-ST elevation (NSTEMI) myocardial infarction ([place])   . Prolapsed uterus   . Sleep apnea    "have mask; haven't worn it for a long time" (04/24/2017)  . Status post coronary artery stent placement     Past  Surgical History: Past Surgical History:  Procedure Laterality Date  . CARDIAC CATHETERIZATION  2011  . CORONARY STENT INTERVENTION N/A 04/27/2017   Procedure: CORONARY STENT INTERVENTION;  Surgeon: Jettie Booze, MD;  Location: [facility];  Service: Cardiovascular;  Laterality: N/A;  . LEFT HEART CATH AND CORONARY ANGIOGRAPHY N/A 04/27/2017   Procedure: LEFT HEART CATH AND CORONARY ANGIOGRAPHY;  Surgeon: Jettie Booze, MD;  Location: [facility];  Service: Cardiovascular;  Laterality: N/A;  . TUBAL LIGATION      Social History: Social History   Tobacco Use  . Smoking status: Former Smoker    Packs/day: 1.00    Years: 33.00    Pack years: 33.00    Types: Cigarettes    Start date: 01/20/1973    Quit date: 11/04/2006    Years since quitting: 12.2  . Smokeless tobacco: Never Used  Substance Use Topics  . Alcohol use: Never    Alcohol/week: 0.0 standard drinks  . Drug use: Never  Patient  lives with her grandchildren, one is 82 and one is 53 and one 18. Denies current tobacco use, quit >10 years ago. Denies ETOH use. Denies illicit drug use. At [facility], patient uses a cane/wheelchair for assistance with ambulation.   Family History: Family History  Problem Relation Age of Onset  . Diabetes Mother   . Hypertension Mother   . Hypertension Father    Allergies and Medications: Allergies  Allergen Reactions  . Lipitor [Atorvastatin] Other (See Comments)    Cramping in feet that resolved after stopping med  . Diclofenac Nausea Only and Other (See Comments)    Caused a lot of stomach pain    No current facility-administered medications on file prior to encounter.   Current Outpatient Medications on File Prior to Encounter  Medication Sig Dispense Refill  . acetaminophen (TYLENOL) 500 MG tablet Take 1,000 mg by mouth every 12 (twelve) hours as needed for mild pain.     Marland Kitchen amLODipine (NORVASC) 5 MG tablet Take 1 tablet (5 mg total) by mouth daily. 90 tablet 3  .  aspirin EC 81 MG tablet Take 1 tablet (81 mg total) by mouth daily. 90 tablet 3  . BRILINTA 90 MG TABS tablet TAKE 1 TABLET BY MOUTH 2 TIMES DAILY. (Patient taking differently: Take 90 mg by mouth 2 (two) times daily. ) 60 tablet 6  . carvedilol (COREG) 12.5 MG tablet TAKE 1 TABLET BY MOUTH 2 TIMES DAILY WITH A MEAL. (Patient taking differently: Take 12.5 mg by mouth 2 (two) times daily with a meal. ) 180 tablet 3  . insulin aspart (NOVOLOG FLEXPEN) 100 UNIT/ML FlexPen Inject subcutaneously as directed per sliding scale. Sliding scale: 201-250: 2 units; 251-300: 4 units; 301-350: 6 units; 351-400: 8 units. (Patient taking differently: Inject 2-8 Units into the skin 3 (three) times daily with meals. Inject subcutaneously as directed per sliding scale. Sliding scale: 201-250: 2 units; 251-300: 4 units; 301-350: 6 units; 351-400: 8 units.) 15 pen 2  . Insulin Glargine (LANTUS SOLOSTAR) 100 UNIT/ML Solostar Pen INJECT 32 units into skin dialy (Patient taking differently: Inject 32 Units into the skin daily. INJECT 32 units into skin daily) 45 pen 3  . lisinopril (ZESTRIL) 10 MG tablet Take 20 mg by mouth daily.    . metFORMIN (GLUCOPHAGE) 1000 MG tablet TAKE 1 TABLET BY MOUTH TWICE A DAY WITH FOOD 60 tablet 2  . rosuvastatin (CRESTOR) 40 MG tablet Take 1 tablet (40 mg total) by mouth daily. 90 tablet 3  . ACCU-CHEK FASTCLIX LANCETS MISC 1 each by Does not apply route daily. Check sugar daily as needed 102 each 3  . ACCU-CHEK GUIDE test strip TEST BLOOD SUGAR UP TO 3 TIMES A DAY 100 each 0  . B-D UF III MINI PEN NEEDLES 31G X 5 MM MISC USE AS DIRECTED 3 TIMES A DAY 100 each 2  . Blood Glucose Monitoring Suppl (ACCU-CHEK NANO SMARTVIEW) w/Device KIT Use to test blood sugar up to 3 times a day. ICD-10 code: E11.9 1 kit 0  . glucose blood (ACCU-CHEK AVIVA) test strip Use as instructed 100 each 12  . glucose blood (ACCU-CHEK SMARTVIEW) test strip Use as instructed 100 each 12    Objective: BP 100/70   Pulse  71   Temp (!) 102.6 F (39.2 C) (Rectal)   Resp 17   Ht '5\' 4"'$  (1.626 m)   Wt 78.5 kg   SpO2 94%   BMI 29.70 kg/m   Exam: General: Elderly female appearing stated age,  lying in bed in no acute distress Eyes: Extraocular muscles intact bilaterally no scleral icterus, no conjunctival injection, pupils sluggishly reactive ENTM: Dry mucous membranes Cardiovascular: Regular rate and rhythm, no murmurs appreciated, bilateral radial pulses palpable cap refill delayed Respiratory: Diffuse and bilateral crackles, no wheezing, decreased breath sounds, patient on 3 L nasal cannula, speaking full sentences Gastrointestinal: Diffuse, vague mild tenderness to palpation, abdomen is obese and soft MSK: Moves all extremities with normal range of motion Derm: Skin is dry and intact, no lesions or ulcerations appreciated, no rashes Neuro: Patient is alert and oriented x4, patient follows commands, patient appears somewhat drowsy but alert enough to cooperate with exam, 5/5 strength in bilateral upper and lower extremities  Labs and Imaging: CBC BMET  Recent Labs  Lab 02/17/19 2328  WBC 7.7  HGB 12.2  HCT 38.7  PLT 190   Recent Labs  Lab 02/17/19 2328  NA 129*  K 3.7  CL 101  CO2 15*  BUN 20  CREATININE 2.25*  GLUCOSE 318*  CALCIUM 8.5*      Ref. Range 02/17/2019 23:28  LDH Latest Ref Range: 98 - 192 U/L 196 (H)  Triglycerides Latest Ref Range: <150 mg/dL 144  Ferritin Latest Ref Range: 11 - 307 ng/mL 308 (H)  CRP Latest Ref Range: <1.0 mg/dL 5.9 (H)  Lactic Acid, Venous Latest Ref Range: 0.5 - 1.9 mmol/L 2.1 (HH)  Procalcitonin Latest Units: ng/mL 0.10   EKG: SR, borderline prolonged QTc 492  DG Chest Port 1 View  Result Date: 02/18/2019 CLINICAL DATA:  Fever, diaphoretic and weakness, unable to communicate EXAM: PORTABLE CHEST 1 VIEW COMPARISON:  Radiograph 04/28/2017 FINDINGS: Multifocal patchy opacities throughout both lungs. No pneumothorax or effusion. No convincing features of  edema. Accentuation of the cardiomediastinal contours is likely secondary to low volumes, portable technique and left anterior oblique rotation. The aorta is slightly calcified. No acute osseous or soft tissue abnormality. Degenerative changes are present in the imaged spine and shoulders. IMPRESSION: Multifocal patchy opacities throughout both lungs. Findings worrisome for multifocal pneumonia including atypical infection such as COVID-19. Electronically Signed   By: Lovena Le M.D.   On: 02/18/2019 00:08    Stark Klein, MD 02/18/2019, 3:26 AM PGY-1, [facility] Intern pager: [phone], text pages welcome  FPTS Upper-Level Resident Addendum   I have independently interviewed and examined the patient. I have discussed the above with the original author and agree with their documentation. My edits for correction/addition/clarification are in blue. Please see also any attending notes.   Caroline More, DO PGY-3, [facility] 02/18/2019 3:45 AM  FPTS Service pager: [phone] (text pages welcome through [facility])

## 2019-02-18 NOTE — Progress Notes (Signed)
Patient O2 sat is 88% RA. O2 applied  At 3L [place]  02 back to 90 at this time. Will continue to monitor patient.

## 2019-02-18 NOTE — Progress Notes (Signed)
SATURATION QUALIFICATIONS: (This note is used to comply with regulatory documentation for home oxygen)  Patient Saturations on Room Air at Rest = 91%  Patient Saturations on Room Air while Ambulating = 86%  Patient Saturations on 3 Liters of oxygen while Ambulating = 92%  Please briefly explain why patient needs home oxygen: desated  And when walking. O2 sat while walking is 86% on RA.

## 2019-02-18 NOTE — Progress Notes (Addendum)
Inpatient Diabetes Program Recommendations  AACE/ADA: New Consensus Statement on Inpatient Glycemic Control (2015)  Target Ranges:  Prepandial:   less than 140 mg/dL      Peak postprandial:   less than 180 mg/dL (1-2 hours)      Critically ill patients:  140 - 180 mg/dL   Lab Results  Component Value Date   GLUCAP 397 (H) 02/18/2019   HGBA1C 13.6 (H) 02/18/2019    Review of Glycemic Control Results for Holly Acosta, Holly Acosta (MRN [id number]) as of 02/18/2019 12:32  Ref. Range 02/17/2019 23:24 02/18/2019 09:53 02/18/2019 12:02  Glucose-Capillary Latest Ref Range: 70 - 99 mg/dL 297 (H) 403 (H) 397 (H)   Diabetes history: DM 2 Outpatient Diabetes medications:  Novolog 2-8 units tid with meals, Lantus 32 units daily, Metformin 1000 mg bid Current orders for Inpatient glycemic control:  Novolog moderate tid with meals and HS Lantus 20 units daily Decadron 6 mg daily Tradjenta 5 mg daily Inpatient Diabetes Program Recommendations:   Please consider increasing Lantus to 32 units daily and start now.  Also consider adding Novolog meal coverage 4 units tid with meals.   Thanks  Holly Perl, RN, BC-ADM Inpatient Diabetes Coordinator Pager [phone] (8a-5p)  Addendum 5:20 pm- Spoke with patient by phone.  Discussed A1C of 13.6%-  Of note patients A1C was in the 13% range in 2019 as well.  Reminded patient of blood sugar goals and that her A1C is too high.  She states that she has not been taking her insulin recently b/c she was not eating.  Explained that she still needs to take her Lantus b/c it is basal and should not be affected by what she eats.  Also encouraged her to check her blood sugars frequently and home and call MD if consistently >250 mg/dL for medication adjustments.  Patient verbalized understanding.  She states she has no further questions.

## 2019-02-18 NOTE — Discharge Summary (Addendum)
[facility] Discharge Summary  Patient name: [place] record number: [id number] Date of birth: [birth year] Age: 64 y.o. Gender: female Date of Admission: 02/17/2019  Date of Discharge: 02/19/2019 Admitting Physician: Martyn Malay, MD  Primary Care Provider: Daisy Floro, DO Consultants: DM Coordinator   Indication for Hospitalization: acute hypoxic respiratory failure 2/2 to COVID PNA  Discharge Diagnoses/Problem List:  Principal Problem:   Pneumonia due to COVID-19 virus Active Problems:   DM (diabetes mellitus), type 2, uncontrolled ([place])   HYPERTENSION, BENIGN ESSENTIAL   Status post coronary artery stent placement   Coronary artery disease involving native coronary artery of native heart with unstable angina pectoris ([place])   Acute respiratory failure with hypoxia ([place])   AKI (acute kidney injury) ([place])   Severe sepsis [facility])  Patient Active Problem List   Diagnosis Date Noted  . Pneumonia due to COVID-19 virus 02/18/2019  . Severe sepsis ([place]) 02/18/2019  . Acute respiratory failure with hypoxia ([place])   . AKI (acute kidney injury) ([place])   . Dehydration   . OSA (obstructive sleep apnea) 04/29/2018  . Women's annual routine gynecological examination 02/08/2018  . Severe obstructive sleep apnea-hypopnea syndrome 01/01/2018  . Cerebrovascular accident (CVA) due to occlusion of left posterior cerebral artery ([place]) 12/03/2017  . Snoring 12/03/2017  . Excessive daytime sleepiness 12/03/2017  . Screening for colon cancer 06/02/2017  . Constipation 06/02/2017  . Coronary artery disease involving native coronary artery of native heart with unstable angina pectoris ([place]) 04/30/2017  . Acute cerebrovascular accident (CVA) of cerebellum ([place]) 04/29/2017  . Elevated troponin   . Status post coronary artery stent placement   . Non-ST elevation (NSTEMI) myocardial infarction ([place])   . Chest pain 04/24/2017  . Left hip pain 02/23/2014  .  Obesity 01/29/2012  . Osteoarthritis, knee 12/11/2011  . COLONIC POLYPS, HYPERPLASTIC 09/13/2009  . THYROMEGALY 08/10/2009  . Obstructive sleep apnea 08/10/2009  . VENTRAL HERNIA 01/26/2009  . DEPRESSION 07/07/2008  . DM (diabetes mellitus), type 2, uncontrolled ([place]) 05/25/2007  . Hyperlipidemia associated with type 2 diabetes mellitus ([place]) 04/02/2007  . HYPERTENSION, BENIGN ESSENTIAL 04/02/2007  . OA (osteoarthritis) of knee 04/02/2007  . Uterine prolapse 01/20/2006    Disposition: COVID at Home, DME 2L [place], [place]  Discharge Condition: improved and stable  Discharge Exam:   Brief Hospital Course:  GRATIA DISLA is a 64 y.o. female presenting with acute hypoxic respiratory failure in setting of COVID PNA. PMH is significant for T2DM, HTN, HLD, OSA, prior CVA and CAD.   Sepsis 2/2 COVID PNA  Patient presents via EMS after being found cyanotic by family members and hypoxic to 86%. Upon arrival to ED, patient met sepsis criteria with RR of 35, fever of 102.6, and hypotension 90/53. She was treated with 2 liters of IVF and subsequently treated with azithromycin and CTX for presumed CAP PNA due to CXR with multifocal PNA. Patient later tested positive for COVID and was started on remdesivir and decadron. Patient's blood pressure subsequently increased with systolics greater than 712 and improved alertness. Patient was then admitted to progressive floor where she was placed on cardiac monitors and continued on remdesivir and decadron. patient's respiratory status improved daily, but still required oxygen. She was discharge with COVID at home remote monitoring and DME O2 2L [place].   Acute Renal Failure Cr elevated at 2.25 on admission with normal baseline. Creatinine improved to 1.9 on day of discharge. Acute renal failure is likely due to  severe dehydration as patient was without PO intake for several days prior to initial presentation to ED. On exam, patient demonstrated dry mucous membranes and  delayed capillary refill. She was treated with mivf and Cr was monitored with CMP. Nephrotoxic medications were avoided throughout this admission.   T2DM  Hyperglycemia  On admission the patient's home insulin regimen was listed as including lantus 32 units daily and metformin 1000 BID. Exenatide 62m weekly was listed in PCP notes, but not in current med list. Of note, patient had not  been seen by PCP for diabetes management since 10/2017. On admission, BG 348> 297. Last hemoglobin A1c 02/18/19 noted to be 13.6. She was placed on sliding scale insuling, 15 units of lantus daily, four times daily CBG checks and a consult to the diabetes coordinator. Also started on litagliptan 543m Discharged on home lantus 32.   HTN Patient's home medication regimen includes amlodipine 5, lisinopril 203mcarvedilol 12.5 mg bid. Patient noted to initially be hypotensive when presenting to ED. Initially home medications were held and were re-started when the patient's BP stabilized and restarted on home amlodipine and coreg.   OSA on BiPAP Patient noted to use BiPAP nightly at home with 93% compliance record as of 02/10/19. Due to COVID PNA and hospital protocol, the patient was unable to use Bipap at night during hospitalization.    HLD Last cholesterol panel in 4/19 with cholesterol 173, HDL 34, LDL 107. 02/17/19 TG 144. Patient was continued on her home statin therapy throughout this admission.  CAD Hx of NSTEMI in 04/2017  Hx of PCI placement in April of 2019 for NSTEMI. On admission, patient denied chest pain. EKG on admission showed normal sinus rhythm, no ST elevation, QTc of 492. Patient was on cardiac monitoring throughout this admission.  Home meds including ASA 26m47mBrilinta 90 bid were continued.     Hx of Prior CVA, 04/2017  Following PCI procedure, patient was found to have a large left-sided posterior inferior cerebellar arterial infarction in 2019. No neurological deficits were noted on exam  during this admission. Patient [facility]. ASA and Brilinta were continued throughout this hospital stay.    Issues for Follow Up:  1. Follow up patient respiratory status. Discharged on 2-3L [place].  2. Repeat CMP to follow LFTs (slightly elevated due to remdesivir) and Cr. 3. Lisinopril held on discharge, consider restarting if Cr improved and BP can tolerate 4. Metformin held due to elevated Cr. Restart when Cr improved.  5. Patient likely need titration of insulin given poorly controlled DM and continued steroids. Consider f/u with Dr. KovaRosaland Lao addition of SGLT2.   Significant Procedures: none  Significant Labs and Imaging:  Recent Labs  Lab 02/17/19 2328 02/18/19 0439 02/19/19 0500  WBC 7.7 8.1 12.3*  HGB 12.2 11.4* 11.6*  HCT 38.7 36.4 37.1  PLT 190 157 234   Recent Labs  Lab 02/17/19 2328 02/17/19 2328 02/18/19 0439 02/19/19 0500  NA 129*  --  134* 135  K 3.7   < > 4.0 4.3  CL 101  --  105 106  CO2 15*  --  16* 18*  GLUCOSE 318*  --  340* 286*  BUN 20  --  22 32*  CREATININE 2.25*  --  2.16* 1.92*  CALCIUM 8.5*  --  7.9* 8.6*  ALKPHOS 35*  --  33* 42  AST 35  --  40 56*  ALT 29  --  30 46*  ALBUMIN 2.7*  --  2.3* 2.5*   < > = values in this interval not displayed.    No results found.   Results/Tests Pending at Time of Discharge: None   Discharge Medications:  Allergies as of 02/19/2019      Reactions   Lipitor [atorvastatin] Other (See Comments)   Cramping in feet that resolved after stopping med   Diclofenac Nausea Only, Other (See Comments)   Caused a lot of stomach pain       Medication List    STOP taking these medications   metFORMIN 1000 MG tablet Commonly known as: GLUCOPHAGE     TAKE these medications   Accu-Chek FastClix Lancets Misc 1 each by Does not apply route daily. Check sugar daily as needed   Accu-Chek Nano SmartView w/Device Kit Use to test  blood sugar up to 3 times a day. ICD-10 code: E11.9   acetaminophen 500 MG tablet Commonly known as: TYLENOL Take 1,000 mg by mouth every 12 (twelve) hours as needed for mild pain.   amLODipine 5 MG tablet Commonly known as: NORVASC Take 1 tablet (5 mg total) by mouth daily.   aspirin EC 81 MG tablet Take 1 tablet (81 mg total) by mouth daily.   B-D UF III MINI PEN NEEDLES 31G X 5 MM Misc Generic drug: Insulin Pen Needle USE AS DIRECTED 3 TIMES A DAY   Brilinta 90 MG Tabs tablet Generic drug: ticagrelor TAKE 1 TABLET BY MOUTH 2 TIMES DAILY. What changed: how much to take   carvedilol 12.5 MG tablet Commonly known as: COREG TAKE 1 TABLET BY MOUTH 2 TIMES DAILY WITH A MEAL. What changed: See the new instructions.   dexamethasone 6 MG tablet Commonly known as: DECADRON Take 1 tablet (6 mg total) by mouth daily for 7 days. Start taking on: February 21, 2019   glucose blood test strip Commonly known as: Accu-Chek Aviva Use as instructed   glucose blood test strip Commonly known as: Accu-Chek SmartView Use as instructed   Accu-Chek Guide test strip Generic drug: glucose blood TEST BLOOD SUGAR UP TO 3 TIMES A DAY   Lantus SoloStar 100 UNIT/ML Solostar Pen Generic drug: Insulin Glargine INJECT 32 units into skin dialy Start taking on: February 20, 2019 What changed:   how much to take  how to take this  when to take this  additional instructions   linagliptin 5 MG Tabs tablet Commonly known as: TRADJENTA Take 1 tablet (5 mg total) by mouth daily. Start taking on: February 20, 2019   lisinopril 10 MG tablet Commonly known as: ZESTRIL Take 20 mg by mouth daily.   NovoLOG FlexPen 100 UNIT/ML FlexPen Generic drug: insulin aspart Inject subcutaneously as directed per sliding scale. Sliding scale: 201-250: 2 units; 251-300: 4 units; 301-350: 6 units; 351-400: 8 units. What changed:   how much to take  how to take this  when to take this   rosuvastatin 40  MG tablet Commonly known as: CRESTOR Take 1 tablet (40 mg total) by mouth daily.            Durable Medical Equipment  (From admission, onward)         Start     Ordered   02/19/19 0820  For home use only DME oxygen  Once    Question Answer Comment  Length of Need 6 Months   Mode or (Route) Nasal cannula   Liters per Minute 3   Frequency Continuous (stationary and  portable oxygen unit needed)   Oxygen conserving device Yes   Oxygen delivery system Gas      02/19/19 0819          Discharge Instructions: Please refer to Patient Instructions section of EMR for full details.  Patient was counseled important signs and symptoms that should prompt return to medical care, changes in medications, dietary instructions, activity restrictions, and follow up appointments.   Follow-Up Appointments: [place] Follow up.   Why: HOme oxygen will be set up at your house today. Contact information: [place] [address] [phone]        [place], Peter M, MD .   Specialty: Cardiology Contact information: [address] [place] [address] [phone]        Caroline More, [place]. Go on 02/23/2019.   Specialty: Family Medicine Why: 3:30PM Virtual Visit, but will need to get labs. Please call the office to get you lab visit scheduled.  Contact information: [address] [address] [phone]           Bonnita Hollow, MD 02/19/2019, 1:33 PM PGY-3, [place]

## 2019-02-18 NOTE — Progress Notes (Signed)
SATURATION QUALIFICATIONS: (This note is used to comply with regulatory documentation for home oxygen)  Patient Saturations on Room Air at Rest = 92%  Patient Saturations on Room Air while Ambulating = 89%  Patient Saturations on N/A Liters of oxygen while Ambulating = N/A%  Please briefly explain why patient needs home oxygen:Pt does not need O2.  [place] Pager [phone] Office [phone]

## 2019-02-18 NOTE — Discharge Instructions (Addendum)
You are scheduled for an outpatient infusion of Remdesivir at  830AM on Sunday 1/31, Monday 2/1, and Tuesday 2/2.  Please [facility] to [facility] at [address]  Drive to the security guard and tell them you are here for an infusion. They will direct you to the front entrance where we will come and get you.  For questions call [phone].  Thanks   Please self quarentine for 21 days since the start of your illness. This would be February 8.  10 Things You Can Do to Manage Your COVID-19 Symptoms at Home If you have possible or confirmed COVID-19: 1. Stay home from work and school. And stay away from other public places. If you must go out, avoid using any kind of public transportation, ridesharing, or taxis. 2. Monitor your symptoms carefully. If your symptoms get worse, call your healthcare provider immediately. 3. Get rest and stay hydrated. 4. If you have a medical appointment, call the healthcare provider ahead of time and tell them that you have or may have COVID-19. 5. For medical emergencies, call 911 and notify the dispatch personnel that you have or may have COVID-19. 6. Cover your cough and sneezes with a tissue or use the inside of your elbow. 7. Wash your hands often with soap and water for at least 20 seconds or clean your hands with an alcohol-based hand sanitizer that contains at least 60% alcohol. 8. As much as possible, stay in a specific room and away from other people in your home. Also, you should use a separate bathroom, if available. If you need to be around other people in or outside of the home, wear a mask. 9. Avoid sharing personal items with other people in your household, like dishes, towels, and bedding. 10. Clean all surfaces that are touched often, like counters, tabletops, and doorknobs. Use household cleaning sprays or wipes according to the label instructions. [email or web address] 07/21/2018 This information is not intended to replace advice given to  you by your health care provider. Make sure you discuss any questions you have with your health care provider. Document Revised: 12/23/2018 Document Reviewed: 12/23/2018 Elsevier Patient Education  2020 [place] [employer].   COVID-19 COVID-19 is a respiratory infection that is caused by a virus called severe acute respiratory syndrome coronavirus 2 (SARS-CoV-2). The disease is also known as coronavirus disease or novel coronavirus. In some people, the virus may not cause any symptoms. In others, it may cause a serious infection. The infection can get worse quickly and can lead to complications, such as:  Pneumonia, or infection of the lungs.  Acute respiratory distress syndrome or ARDS. This is a condition in which fluid build-up in the lungs prevents the lungs from filling with air and passing oxygen into the blood.  Acute respiratory failure. This is a condition in which there is not enough oxygen passing from the lungs to the body or when carbon dioxide is not passing from the lungs out of the body.  Sepsis or septic shock. This is a serious bodily reaction to an infection.  Blood clotting problems.  Secondary infections due to bacteria or fungus.  Organ failure. This is when your body's organs stop working. The virus that causes COVID-19 is contagious. This means that it can spread from person to person through droplets from coughs and sneezes (respiratory secretions). What are the causes? This illness is caused by a virus. You may catch the virus by:  Breathing in droplets from an infected person. Droplets  can be spread by a person breathing, speaking, singing, coughing, or sneezing.  Touching something, like a table or a doorknob, that was exposed to the virus (contaminated) and then touching your mouth, nose, or eyes. What increases the risk? Risk for infection You are more likely to be infected with this virus if you:  Are within 6 feet (2 meters) of a person with  COVID-19.  Provide care for or live with a person who is infected with COVID-19.  Spend time in crowded indoor spaces or live in shared housing. Risk for serious illness You are more likely to become seriously ill from the virus if you:  Are 57 years of age or older. The higher your age, the more you are at risk for serious illness.  Live in a nursing home or long-term care facility.  Have cancer.  Have a long-term (chronic) disease such as: ? Chronic lung disease, including chronic obstructive pulmonary disease or asthma. ? A long-term disease that lowers your body's ability to fight infection (immunocompromised). ? Heart disease, including heart failure, a condition in which the arteries that lead to the heart become narrow or blocked (coronary artery disease), a disease which makes the heart muscle thick, weak, or stiff (cardiomyopathy). ? Diabetes. ? Chronic kidney disease. ? Sickle cell disease, a condition in which red blood cells have an abnormal "sickle" shape. ? Liver disease.  Are obese. What are the signs or symptoms? Symptoms of this condition can range from mild to severe. Symptoms may appear any time from 2 to 14 days after being exposed to the virus. They include:  A fever or chills.  A cough.  Difficulty breathing.  Headaches, body aches, or muscle aches.  Runny or stuffy (congested) nose.  A sore throat.  New loss of taste or smell. Some people may also have stomach problems, such as nausea, vomiting, or diarrhea. Other people may not have any symptoms of COVID-19. How is this diagnosed? This condition may be diagnosed based on:  Your signs and symptoms, especially if: ? You live in an area with a COVID-19 outbreak. ? You recently traveled to or from an area where the virus is common. ? You provide care for or live with a person who was diagnosed with COVID-19. ? You were exposed to a person who was diagnosed with COVID-19.  A physical exam.  Lab  tests, which may include: ? Taking a sample of fluid from the back of your nose and throat (nasopharyngeal fluid), your nose, or your throat using a swab. ? A sample of mucus from your lungs (sputum). ? Blood tests.  Imaging tests, which may include, X-rays, CT scan, or ultrasound. How is this treated? At present, there is no medicine to treat COVID-19. Medicines that treat other diseases are being used on a trial basis to see if they are effective against COVID-19. Your health care provider will talk with you about ways to treat your symptoms. For most people, the infection is mild and can be managed at home with rest, fluids, and over-the-counter medicines. Treatment for a serious infection usually takes places in a hospital intensive care unit (ICU). It may include one or more of the following treatments. These treatments are given until your symptoms improve.  Receiving fluids and medicines through an IV.  Supplemental oxygen. Extra oxygen is given through a tube in the nose, a face mask, or a hood.  Positioning you to lie on your stomach (prone position). This makes it easier for  oxygen to get into the lungs.  Continuous positive airway pressure (CPAP) or bi-level positive airway pressure (BPAP) machine. This treatment uses mild air pressure to keep the airways open. A tube that is connected to a motor delivers oxygen to the body.  Ventilator. This treatment moves air into and out of the lungs by using a tube that is placed in your windpipe.  Tracheostomy. This is a procedure to create a hole in the neck so that a breathing tube can be inserted.  Extracorporeal membrane oxygenation (ECMO). This procedure gives the lungs a chance to recover by taking over the functions of the heart and lungs. It supplies oxygen to the body and removes carbon dioxide. Follow these instructions at home: Lifestyle  If you are sick, stay home except to get medical care. Your health care provider will tell  you how long to stay home. Call your health care provider before you go for medical care.  Rest at home as told by your health care provider.  Do not use any products that contain nicotine or tobacco, such as cigarettes, e-cigarettes, and chewing tobacco. If you need help quitting, ask your health care provider.  Return to your normal activities as told by your health care provider. Ask your health care provider what activities are safe for you. General instructions  Take over-the-counter and prescription medicines only as told by your health care provider.  Drink enough fluid to keep your urine pale yellow.  Keep all follow-up visits as told by your health care provider. This is important. How is this prevented?  There is no vaccine to help prevent COVID-19 infection. However, there are steps you can take to protect yourself and others from this virus. To protect yourself:   Do not travel to areas where COVID-19 is a risk. The areas where COVID-19 is reported change often. To identify high-risk areas and travel restrictions, check the CDC travel website: FatFares.com.br  If you live in, or must travel to, an area where COVID-19 is a risk, take precautions to avoid infection. ? Stay away from people who are sick. ? Wash your hands often with soap and water for 20 seconds. If soap and water are not available, use an alcohol-based hand sanitizer. ? Avoid touching your mouth, face, eyes, or nose. ? Avoid going out in public, follow guidance from your state and local health authorities. ? If you must go out in public, wear a cloth face covering or face mask. Make sure your mask covers your nose and mouth. ? Avoid crowded indoor spaces. Stay at least 6 feet (2 meters) away from others. ? Disinfect objects and surfaces that are frequently touched every day. This may include:  Counters and tables.  Doorknobs and light switches.  Sinks and faucets.  Electronics, such as  phones, remote controls, keyboards, computers, and tablets. To protect others: If you have symptoms of COVID-19, take steps to prevent the virus from spreading to others.  If you think you have a COVID-19 infection, contact your health care provider right away. Tell your health care team that you think you may have a COVID-19 infection.  Stay home. Leave your house only to seek medical care. Do not use public transport.  Do not travel while you are sick.  Wash your hands often with soap and water for 20 seconds. If soap and water are not available, use alcohol-based hand sanitizer.  Stay away from other members of your household. Let healthy household members care for children and  pets, if possible. If you have to care for children or pets, wash your hands often and wear a mask. If possible, stay in your own room, separate from others. Use a different bathroom.  Make sure that all people in your household wash their hands well and often.  Cough or sneeze into a tissue or your sleeve or elbow. Do not cough or sneeze into your hand or into the air.  Wear a cloth face covering or face mask. Make sure your mask covers your nose and mouth. Where to find more information  Centers for Disease Control and Prevention: PurpleGadgets.be  World Health Organization: https://www.castaneda.info/ Contact a health care provider if:  You live in or have traveled to an area where COVID-19 is a risk and you have symptoms of the infection.  You have had contact with someone who has COVID-19 and you have symptoms of the infection. Get help right away if:  You have trouble breathing.  You have pain or pressure in your chest.  You have confusion.  You have bluish lips and fingernails.  You have difficulty waking from sleep.  You have symptoms that get worse. These symptoms may represent a serious problem that is an emergency. Do not wait to see if the symptoms  will go away. Get medical help right away. Call your local emergency services (911 in the [place] Do not drive yourself to the hospital. Let the emergency medical personnel know if you think you have COVID-19. Summary  COVID-19 is a respiratory infection that is caused by a virus. It is also known as coronavirus disease or novel coronavirus. It can cause serious infections, such as pneumonia, acute respiratory distress syndrome, acute respiratory failure, or sepsis.  The virus that causes COVID-19 is contagious. This means that it can spread from person to person through droplets from breathing, speaking, singing, coughing, or sneezing.  You are more likely to develop a serious illness if you are 78 years of age or older, have a weak immune system, live in a nursing home, or have chronic disease.  There is no medicine to treat COVID-19. Your health care provider will talk with you about ways to treat your symptoms.  Take steps to protect yourself and others from infection. Wash your hands often and disinfect objects and surfaces that are frequently touched every day. Stay away from people who are sick and wear a mask if you are sick. This information is not intended to replace advice given to you by your health care provider. Make sure you discuss any questions you have with your health care provider. Document Revised: 11/05/2018 Document Reviewed: 02/11/2018 Elsevier Patient Education  [place].   Acute Respiratory Failure, Adult  Acute respiratory failure occurs when there is not enough oxygen passing from your lungs to your body. When this happens, your lungs have trouble removing carbon dioxide from the blood. This causes your blood oxygen level to drop too low as carbon dioxide builds up. Acute respiratory failure is a medical emergency. It can develop quickly, but it is temporary if treated promptly. Your lung capacity, or how much air your lungs can hold, may improve with time,  exercise, and treatment. What are the causes? There are many possible causes of acute respiratory failure, including:  Lung injury.  Chest injury or damage to the ribs or tissues near the lungs.  Lung conditions that affect the flow of air and blood into and out of the lungs, such as pneumonia, acute respiratory distress  syndrome, and cystic fibrosis.  Medical conditions, such as strokes or spinal cord injuries, that affect the muscles and nerves that control breathing.  Blood infection (sepsis).  Inflammation of the pancreas (pancreatitis).  A blood clot in the lungs (pulmonary embolism).  A large-volume blood transfusion.  Burns.  Near-drowning.  Seizure.  Smoke inhalation.  Reaction to medicines.  Alcohol or drug overdose. What increases the risk? This condition is more likely to develop in people who have:  A blocked airway.  Asthma.  A condition or disease that damages or weakens the muscles, nerves, bones, or tissues that are involved in breathing.  A serious infection.  A health problem that blocks the unconscious reflex that is involved in breathing, such as hypothyroidism or sleep apnea.  A lung injury or trauma. What are the signs or symptoms? Trouble breathing is the main symptom of acute respiratory failure. Symptoms may also include:  Rapid breathing.  Restlessness or anxiety.  Skin, lips, or fingernails that appear blue (cyanosis).  Rapid heart rate.  Abnormal heart rhythms (arrhythmias).  Confusion or changes in behavior.  Tiredness or loss of energy.  Feeling sleepy or having a loss of consciousness. How is this diagnosed? Your health care provider can diagnose acute respiratory failure with a medical history and physical exam. During the exam, your health care provider will listen to your heart and check for crackling or wheezing sounds in your lungs. Your may also have tests to confirm the diagnosis and determine what is causing  respiratory failure. These tests may include:  Measuring the amount of oxygen in your blood (pulse oximetry). The measurement comes from a small device that is placed on your finger, earlobe, or toe.  Other blood tests to measure blood gases and to look for signs of infection.  Sampling your cerebral spinal fluid or tracheal fluid to check for infections.  Chest X-ray to look for fluid in spaces that should be filled with air.  Electrocardiogram (ECG) to look at the heart's electrical activity. How is this treated? Treatment for this condition usually takes places in a hospital intensive care unit (ICU). Treatment depends on what is causing the condition. It may include one or more treatments until your symptoms improve. Treatment may include:  Supplemental oxygen. Extra oxygen is given through a tube in the nose, a face mask, or a hood.  A device such as a continuous positive airway pressure (CPAP) or bi-level positive airway pressure (BiPAP or BPAP) machine. This treatment uses mild air pressure to keep the airways open. A mask or other device will be placed over your nose or mouth. A tube that is connected to a motor will deliver oxygen through the mask.  Ventilator. This treatment helps move air into and out of the lungs. This may be done with a bag and mask or a machine. For this treatment, a tube is placed in your windpipe (trachea) so air and oxygen can flow to the lungs.  Extracorporeal membrane oxygenation (ECMO). This treatment temporarily takes over the function of the heart and lungs, supplying oxygen and removing carbon dioxide. ECMO gives the lungs a chance to recover. It may be used if a ventilator is not effective.  Tracheostomy. This is a procedure that creates a hole in the neck to insert a breathing tube.  Receiving fluids and medicines.  Rocking the bed to help breathing. Follow these instructions at home:  Take over-the-counter and prescription medicines only as told  by your health care provider.  Return  to normal activities as told by your health care provider. Ask your health care provider what activities are safe for you.  Keep all follow-up visits as told by your health care provider. This is important. How is this prevented? Treating infections and medical conditions that may lead to acute respiratory failure can help prevent the condition from developing. Contact a health care provider if:  You have a fever.  Your symptoms do not improve or they get worse. Get help right away if:  You are having trouble breathing.  You lose consciousness.  Your have cyanosis or turn blue.  You develop a rapid heart rate.  You are confused. These symptoms may represent a serious problem that is an emergency. Do not wait to see if the symptoms will go away. Get medical help right away. Call your local emergency services (911 in the [place] Do not drive yourself to the hospital. This information is not intended to replace advice given to you by your health care provider. Make sure you discuss any questions you have with your health care provider. Document Revised: 12/19/2016 Document Reviewed: 07/25/2015 Elsevier Patient Education  2020 [employer].   COVID-19 Frequently Asked Questions COVID-19 (coronavirus disease) is an infection that is caused by a large family of viruses. Some viruses cause illness in people and others cause illness in animals like camels, cats, and bats. In some cases, the viruses that cause illness in animals can spread to humans. Where did the coronavirus come from? In December 2019, [place] told the [facility]) of several cases of lung disease (human respiratory illness). These cases were linked to an open seafood and livestock market in the city of [place]. The link to the seafood and livestock market suggests that the virus may have spread from animals to humans. However, since that first outbreak in December, the  virus has also been shown to spread from person to person. What is the name of the disease and the virus? Disease name Early on, this disease was called novel coronavirus. This is because scientists determined that the disease was caused by a new (novel) respiratory virus. The World Health Organization [facility]) has now named the disease COVID-19, or coronavirus disease. Virus name The virus that causes the disease is called severe acute respiratory syndrome coronavirus 2 (SARS-CoV-2). More information on disease and virus naming World Health Organization [facility]): www.who.int/emergencies/diseases/novel-coronavirus-2019/technical-guidance/naming-the-coronavirus-disease-(covid-2019)-and-the-virus-that-causes-it Who is at risk for complications from coronavirus disease? Some people may be at higher risk for complications from coronavirus disease. This includes older adults and people who have chronic diseases, such as heart disease, diabetes, and lung disease. If you are at higher risk for complications, take these extra precautions:  Stay home as much as possible.  Avoid social gatherings and travel.  Avoid close contact with others. Stay at least 6 ft (2 m) away from others, if possible.  Wash your hands often with soap and water for at least 20 seconds.  Avoid touching your face, mouth, nose, or eyes.  Keep supplies on hand at home, such as food, medicine, and cleaning supplies.  If you must go out in public, wear a cloth face covering or face mask. Make sure your mask covers your nose and mouth. How does coronavirus disease spread? The virus that causes coronavirus disease spreads easily from person to person (is contagious). You may catch the virus by:  Breathing in droplets from an infected person. Droplets can be spread by a person breathing, speaking, singing, coughing, or sneezing.  Touching something, like a table or a doorknob, that was exposed to the virus (contaminated) and then  touching your mouth, nose, or eyes. Can I get the virus from touching surfaces or objects? There is still a lot that we do not know about the virus that causes coronavirus disease. Scientists are basing a lot of information on what they know about similar viruses, such as:  Viruses cannot generally survive on surfaces for long. They need a human body (host) to survive.  It is more likely that the virus is spread by close contact with people who are sick (direct contact), such as through: ? Shaking hands or hugging. ? Breathing in respiratory droplets that travel through the air. Droplets can be spread by a person breathing, speaking, singing, coughing, or sneezing.  It is less likely that the virus is spread when a person touches a surface or object that has the virus on it (indirect contact). The virus may be able to enter the body if the person touches a surface or object and then touches his or her face, eyes, nose, or mouth. Can a person spread the virus without having symptoms of the disease? It may be possible for the virus to spread before a person has symptoms of the disease, but this is most likely not the main way the virus is spreading. It is more likely for the virus to spread by being in close contact with people who are sick and breathing in the respiratory droplets spread by a person breathing, speaking, singing, coughing, or sneezing. What are the symptoms of coronavirus disease? Symptoms vary from person to person and can range from mild to severe. Symptoms may include:  Fever or chills.  Cough.  Difficulty breathing or feeling short of breath.  Headaches, body aches, or muscle aches.  Runny or stuffy (congested) nose.  Sore throat.  New loss of taste or smell.  Nausea, vomiting, or diarrhea. These symptoms can appear anywhere from 2 to 14 days after you have been exposed to the virus. Some people may not have any symptoms. If you develop symptoms, call your health  care provider. People with severe symptoms may need hospital care. Should I be tested for this virus? Your health care provider will decide whether to test you based on your symptoms, history of exposure, and your risk factors. How does a health care provider test for this virus? Health care providers will collect samples to send for testing. Samples may include:  Taking a swab of fluid from the back of your nose and throat, your nose, or your throat.  Taking fluid from the lungs by having you cough up mucus (sputum) into a sterile cup.  Taking a blood sample. Is there a treatment or vaccine for this virus? Currently, there is no vaccine to prevent coronavirus disease. Also, there are no medicines like antibiotics or antivirals to treat the virus. A person who becomes sick is given supportive care, which means rest and fluids. A person may also relieve his or her symptoms by using over-the-counter medicines that treat sneezing, coughing, and runny nose. These are the same medicines that a person takes for the common cold. If you develop symptoms, call your health care provider. People with severe symptoms may need hospital care. What can I do to protect myself and my family from this virus?     You can protect yourself and your family by taking the same actions that you would take to prevent the spread of other  viruses. Take the following actions:  Wash your hands often with soap and water for at least 20 seconds. If soap and water are not available, use alcohol-based hand sanitizer.  Avoid touching your face, mouth, nose, or eyes.  Cough or sneeze into a tissue, sleeve, or elbow. Do not cough or sneeze into your hand or the air. ? If you cough or sneeze into a tissue, throw it away immediately and wash your hands.  Disinfect objects and surfaces that you frequently touch every day.  Stay away from people who are sick.  Avoid going out in public, follow guidance from your state and  local health authorities.  Avoid crowded indoor spaces. Stay at least 6 ft (2 m) away from others.  If you must go out in public, wear a cloth face covering or face mask. Make sure your mask covers your nose and mouth.  Stay home if you are sick, except to get medical care. Call your health care provider before you get medical care. Your health care provider will tell you how long to stay home.  Make sure your vaccines are up to date. Ask your health care provider what vaccines you need. What should I do if I need to travel? Follow travel recommendations from your local health authority, the CDC, and WHO. Travel information and advice  Centers for Disease Control and Prevention (CDC): BodyEditor.hu  World Health Organization [facility]): ThirdIncome.ca Know the risks and take action to protect your health  You are at higher risk of getting coronavirus disease if you are traveling to areas with an outbreak or if you are exposed to travelers from areas with an outbreak.  Wash your hands often and practice good hygiene to lower the risk of catching or spreading the virus. What should I do if I am sick? General instructions to stop the spread of infection  Wash your hands often with soap and water for at least 20 seconds. If soap and water are not available, use alcohol-based hand sanitizer.  Cough or sneeze into a tissue, sleeve, or elbow. Do not cough or sneeze into your hand or the air.  If you cough or sneeze into a tissue, throw it away immediately and wash your hands.  Stay home unless you must get medical care. Call your health care provider or local health authority before you get medical care.  Avoid public areas. Do not take public transportation, if possible.  If you can, wear a mask if you must go out of the house or if you are in close contact with someone who is not sick. Make sure  your mask covers your nose and mouth. Keep your home clean  Disinfect objects and surfaces that are frequently touched every day. This may include: ? Counters and tables. ? Doorknobs and light switches. ? Sinks and faucets. ? Electronics such as phones, remote controls, keyboards, computers, and tablets.  Wash dishes in hot, soapy water or use a dishwasher. Air-dry your dishes.  Wash laundry in hot water. Prevent infecting other household members  Let healthy household members care for children and pets, if possible. If you have to care for children or pets, wash your hands often and wear a mask.  Sleep in a different bedroom or bed, if possible.  Do not share personal items, such as razors, toothbrushes, deodorant, combs, brushes, towels, and washcloths. Where to find more information Centers for Disease Control and Prevention (CDC)  Information and news updates: https://www.butler-gonzalez.com/ World Health Organization [facility])  Information and news updates: MissExecutive.com.ee  Coronavirus health topic: https://www.castaneda.info/  Questions and answers on COVID-19: OpportunityDebt.at  Global tracker: who.sprinklr.com American Academy of Pediatrics (AAP)  Information for families: www.healthychildren.org/English/health-issues/conditions/chest-lungs/Pages/2019-Novel-Coronavirus.aspx The coronavirus situation is changing rapidly. Check your local health authority website or the CDC and [facility] websites for updates and news. When should I contact a health care provider?  Contact your health care provider if you have symptoms of an infection, such as fever or cough, and you: ? Have been near anyone who is known to have coronavirus disease. ? Have come into contact with a person who is suspected to have coronavirus disease. ? Have traveled to an area where there is an outbreak of COVID-19. When should I  get emergency medical care?  Get help right away by calling your local emergency services (911 in the [place] if you have: ? Trouble breathing. ? Pain or pressure in your chest. ? Confusion. ? Blue-tinged lips and fingernails. ? Difficulty waking from sleep. ? Symptoms that get worse. Let the emergency medical personnel know if you think you have coronavirus disease. Summary  A new respiratory virus is spreading from person to person and causing COVID-19 (coronavirus disease).  The virus that causes COVID-19 appears to spread easily. It spreads from one person to another through droplets from breathing, speaking, singing, coughing, or sneezing.  Older adults and those with chronic diseases are at higher risk of disease. If you are at higher risk for complications, take extra precautions.  There is currently no vaccine to prevent coronavirus disease. There are no medicines, such as antibiotics or antivirals, to treat the virus.  You can protect yourself and your family by washing your hands often, avoiding touching your face, and covering your coughs and sneezes. This information is not intended to replace advice given to you by your health care provider. Make sure you discuss any questions you have with your health care provider. Document Revised: 11/05/2018 Document Reviewed: 05/04/2018 Elsevier Patient Education  [place].

## 2019-02-18 NOTE — ED Notes (Signed)
REPORT TO DR.WICKLINE PATIENT COVID TEST IS POSTIVE

## 2019-02-18 NOTE — Progress Notes (Signed)
Holly Acosta is a 64 y.o. female patient admitted from ED awake, alert - oriented  X 4 - no acute distress noted.  VSS - Blood pressure 121/67, pulse 72, temperature 97.8 F (36.6 C), temperature source Oral, resp. rate 18, height 5\' 4"  (1.626 m), weight 72 kg, SpO2 96 %.    IV in place, occlusive dsg intact without redness.  Admission INP armband ID verified with patient/family, and in place.   SR up x 2, fall assessment complete, with patient and family able to verbalize understanding of risk associated with falls, and verbalized understanding to call nsg before up out of bed.  Call light within reach, patient able to voice, and demonstrate understanding.  Skin, clean-dry- intact without evidence of bruising, or skin tears.   No evidence of skin break down noted on exam.     Will cont to eval and treat per MD orders.  [place], RN 02/18/2019 7:41 AM

## 2019-02-18 NOTE — Progress Notes (Signed)
Patient scheduled for outpatient Remdesivir infusion at 830AM on Sunday 1/31, Monday 2/1, and Tuesday 2/2  Please advise them to report to [facility] at [address]  Drive to the security guard and tell them you are here for an infusion. They will direct you to the front entrance where we will come and get you.  For questions call [phone].  Thanks

## 2019-02-18 NOTE — Plan of Care (Signed)
  Problem: Education: Goal: Knowledge of risk factors and measures for prevention of condition will improve Outcome: Progressing   Problem: Respiratory: Goal: Will maintain a patent airway Outcome: Progressing Goal: Complications related to the disease process, condition or treatment will be avoided or minimized Outcome: Progressing   

## 2019-02-19 LAB — CBC WITH DIFFERENTIAL/PLATELET
Abs Immature Granulocytes: 0.21 10*3/uL — ABNORMAL HIGH (ref 0.00–0.07)
Basophils Absolute: 0 10*3/uL (ref 0.0–0.1)
Basophils Relative: 0 %
Eosinophils Absolute: 0 10*3/uL (ref 0.0–0.5)
Eosinophils Relative: 0 %
HCT: 37.1 % (ref 36.0–46.0)
Hemoglobin: 11.6 g/dL — ABNORMAL LOW (ref 12.0–15.0)
Immature Granulocytes: 2 %
Lymphocytes Relative: 9 %
Lymphs Abs: 1.1 10*3/uL (ref 0.7–4.0)
MCH: 27.1 pg (ref 26.0–34.0)
MCHC: 31.3 g/dL (ref 30.0–36.0)
MCV: 86.7 fL (ref 80.0–100.0)
Monocytes Absolute: 0.7 10*3/uL (ref 0.1–1.0)
Monocytes Relative: 5 %
Neutro Abs: 10.4 10*3/uL — ABNORMAL HIGH (ref 1.7–7.7)
Neutrophils Relative %: 84 %
Platelets: 234 10*3/uL (ref 150–400)
RBC: 4.28 MIL/uL (ref 3.87–5.11)
RDW: 14.3 % (ref 11.5–15.5)
WBC: 12.3 10*3/uL — ABNORMAL HIGH (ref 4.0–10.5)
nRBC: 0 % (ref 0.0–0.2)

## 2019-02-19 LAB — COMPREHENSIVE METABOLIC PANEL
ALT: 46 U/L — ABNORMAL HIGH (ref 0–44)
AST: 56 U/L — ABNORMAL HIGH (ref 15–41)
Albumin: 2.5 g/dL — ABNORMAL LOW (ref 3.5–5.0)
Alkaline Phosphatase: 42 U/L (ref 38–126)
Anion gap: 11 (ref 5–15)
BUN: 32 mg/dL — ABNORMAL HIGH (ref 8–23)
CO2: 18 mmol/L — ABNORMAL LOW (ref 22–32)
Calcium: 8.6 mg/dL — ABNORMAL LOW (ref 8.9–10.3)
Chloride: 106 mmol/L (ref 98–111)
Creatinine, Ser: 1.92 mg/dL — ABNORMAL HIGH (ref 0.44–1.00)
GFR calc Af Amer: 32 mL/min — ABNORMAL LOW (ref >60)
GFR calc non Af Amer: 27 mL/min — ABNORMAL LOW (ref >60)
Glucose, Bld: 286 mg/dL — ABNORMAL HIGH (ref 70–99)
Potassium: 4.3 mmol/L (ref 3.5–5.1)
Sodium: 135 mmol/L (ref 135–145)
Total Bilirubin: 0.2 mg/dL — ABNORMAL LOW (ref 0.3–1.2)
Total Protein: 6.6 g/dL (ref 6.5–8.1)

## 2019-02-19 LAB — GLUCOSE, CAPILLARY
Glucose-Capillary: 271 mg/dL — ABNORMAL HIGH (ref 70–99)
Glucose-Capillary: 362 mg/dL — ABNORMAL HIGH (ref 70–99)

## 2019-02-19 LAB — C-REACTIVE PROTEIN: CRP: 5.9 mg/dL — ABNORMAL HIGH (ref <1.0)

## 2019-02-19 LAB — D-DIMER, QUANTITATIVE: D-Dimer, Quant: 0.93 ug/mL-FEU — ABNORMAL HIGH (ref 0.00–0.50)

## 2019-02-19 MED ORDER — CARVEDILOL 6.25 MG PO TABS: 6.2500 mg | ORAL_TABLET | Freq: Two times a day (BID) | ORAL | Status: DC

## 2019-02-19 MED ORDER — LANTUS SOLOSTAR 100 UNIT/ML ~~LOC~~ SOPN: PEN_INJECTOR | SUBCUTANEOUS | 3 refills | Status: DC

## 2019-02-19 MED ORDER — AMLODIPINE BESYLATE 5 MG PO TABS: 5.0000 mg | ORAL_TABLET | Freq: Every day | ORAL | Status: DC

## 2019-02-19 MED ORDER — AMLODIPINE BESYLATE 5 MG PO TABS
5.0000 mg | ORAL_TABLET | Freq: Every day | ORAL | Status: DC
  Administered 2019-02-19: 09:00:00 5 mg via ORAL
  Filled 2019-02-19: qty 1

## 2019-02-19 MED ORDER — INSULIN ASPART 100 UNIT/ML ~~LOC~~ SOLN
0.0000 [IU] | Freq: Three times a day (TID) | SUBCUTANEOUS | Status: DC
  Administered 2019-02-19: 13:00:00 20 [IU] via SUBCUTANEOUS

## 2019-02-19 MED ORDER — LINAGLIPTIN 5 MG PO TABS: 5.0000 mg | ORAL_TABLET | Freq: Every day | ORAL | 0 refills | Status: DC

## 2019-02-19 MED ORDER — CARVEDILOL 6.25 MG PO TABS
6.2500 mg | ORAL_TABLET | Freq: Two times a day (BID) | ORAL | Status: DC
  Administered 2019-02-19: 09:00:00 6.25 mg via ORAL
  Filled 2019-02-19: qty 1

## 2019-02-19 MED ORDER — DEXAMETHASONE 6 MG PO TABS: 6.0000 mg | ORAL_TABLET | Freq: Every day | ORAL | 0 refills | Status: AC

## 2019-02-19 MED ORDER — INSULIN GLARGINE 100 UNIT/ML ~~LOC~~ SOLN: 30.0000 [IU] | Freq: Every day | SUBCUTANEOUS | Status: DC

## 2019-02-19 NOTE — Evaluation (Addendum)
Occupational Therapy Evaluation Patient Details Name: Holly Acosta MRN: [id number] DOB: 1955/12/27 Today's Date: 02/19/2019    History of Present Illness Pt adm with acute hypoxic respiratory failure due to covid 19 PNA, acute kidney injury and severe sepsis. PMH - DM, CVA, CAD, HTN   Clinical Impression   This 64 y/o female presents with the above. PTA pt reports mod independent with ADL and functional mobility using RW. Pt up in bathroom on her own without O2 donned (off tele) upon arrival to room, post toileting and standing grooming ADL spot checked O2 sats and SpO2 >90%. Pt completing additional x2 laps in room using RW with SpO2 >/=88%. O2 sats maintaining 89-90% end of session while sitting up in bed therefore reapplied 1L O2. Pt overall requiring minguard-supervision for toileting, standing grooming ADL and functional mobility using RW. Pt requiring intermittent cues for safety and safe RW use. Educated in general energy conservation strategies during ADL/mobility tasks with pt verbalizing understanding. DOE grossly 2/4 with activity this session. Pt will benefit from continued acute OT services and currently recommend follow up [facility] services after discharge to maximize her safety and independence with ADL and mobility. Will follow.     Follow Up Recommendations  Home health OT;Supervision - Intermittent(rec close to 24hr initially)    Equipment Recommendations  None recommended by OT           Precautions / Restrictions Precautions Precautions: None Restrictions Weight Bearing Restrictions: No      Mobility Bed Mobility Overal bed mobility: Modified Independent             General bed mobility comments: HOB elevated, increaed time/effort  Transfers Overall transfer level: Needs assistance Equipment used: Rolling walker (2 wheeled) Transfers: Sit to/from Stand Sit to Stand: Supervision         General transfer comment: assist for lines. VCs for safe hand  placement as Pt pulling up on walker; VCs for safe RW position prior to return to sitting     Balance Overall balance assessment: Needs assistance Sitting-balance support: No upper extremity supported;Feet supported Sitting balance-Leahy Scale: Good     Standing balance support: No upper extremity supported;During functional activity Standing balance-Leahy Scale: Fair Standing balance comment: tends to rely on UE support when able                           ADL either performed or assessed with clinical judgement   ADL Overall ADL's : Needs assistance/impaired Eating/Feeding: Modified independent;Sitting   Grooming: Min guard;Supervision/safety;Standing;Wash/dry hands;Oral care Grooming Details (indicate cue type and reason): pt tending to lean bil forearms on sink counter for UE support Upper Body Bathing: Set up;Supervision/ safety;Sitting   Lower Body Bathing: Min guard;Sit to/from stand;Sitting/lateral leans   Upper Body Dressing : Set up;Supervision/safety;Sitting   Lower Body Dressing: Min guard;Sit to/from stand   Toilet Transfer: Min guard;Supervision/safety;Ambulation;RW Toilet Transfer Details (indicate cue type and reason): pt seated on toilet upon arrival to room Toileting- Clothing Manipulation and Hygiene: Supervision/safety;Sit to/from stand;Sitting/lateral lean Toileting - Clothing Manipulation Details (indicate cue type and reason): pt performing pericare and clothing management without assist     Functional mobility during ADLs: Min guard;Supervision/safety;Rolling walker General ADL Comments: pt overall presenting with weakness, decreased activity tolerance     Vision         Perception     Praxis      Pertinent Vitals/Pain Pain Assessment: No/denies pain     Hand  Dominance Right   Extremity/Trunk Assessment Upper Extremity Assessment Upper Extremity Assessment: Generalized weakness   Lower Extremity Assessment Lower Extremity  Assessment: Defer to PT evaluation       Communication Communication Communication: No difficulties   Cognition Arousal/Alertness: Awake/alert Behavior During Therapy: WFL for tasks assessed/performed Overall Cognitive Status: Within Functional Limits for tasks assessed                                 General Comments: slow to respond at times though overall appears [employer]   General Comments  SpO2 >/=88% on RA when on tele    Exercises     Shoulder Instructions      Home Living Family/patient expects to be discharged to:: Private residence Living Arrangements: Other relatives(grandchildren) Available Help at Discharge: Family;Available PRN/intermittently Type of Home: House Home Access: Ramped entrance     Home Layout: One level     Bathroom Shower/Tub: Tub/shower unit;Walk-in shower   Bathroom Toilet: Standard     Home Equipment: Environmental consultant - 2 wheels;Shower seat;Grab bars - tub/shower          Prior Functioning/Environment Level of Independence: Independent with assistive device(s)        Comments: Uses rolling walker        OT Problem List: Decreased strength;Decreased activity tolerance;Impaired balance (sitting and/or standing);Decreased safety awareness;Cardiopulmonary status limiting activity      OT Treatment/Interventions: Self-care/ADL training;Therapeutic exercise;Energy conservation;DME and/or AE instruction;Therapeutic activities;Patient/family education;Balance training    OT Goals(Current goals can be found in the care plan section) Acute Rehab OT Goals Patient Stated Goal: return home OT Goal Formulation: With patient Time For Goal Achievement: 03/05/19 Potential to Achieve Goals: Good  OT Frequency: Min 2X/week   Barriers to D/C:            Co-evaluation              AM-PAC OT "6 Clicks" Daily Activity     Outcome Measure Help from another person eating meals?: None Help from another person taking care of personal  grooming?: A Little Help from another person toileting, which includes using toliet, bedpan, or urinal?: A Little Help from another person bathing (including washing, rinsing, drying)?: A Little Help from another person to put on and taking off regular upper body clothing?: None Help from another person to put on and taking off regular lower body clothing?: A Little 6 Click Score: 20   End of Session Equipment Utilized During Treatment: Rolling walker Nurse Communication: Mobility status  Activity Tolerance: Patient tolerated treatment well Patient left: in bed;with call bell/phone within reach;with bed alarm set  OT Visit Diagnosis: Other abnormalities of gait and mobility (R26.89);Muscle weakness (generalized) (M62.81)                Time: [id number] OT Time Calculation (min): 24 min Charges:  OT General Charges $OT Visit: 1 Visit OT Evaluation $OT Eval Moderate Complexity: 1 Mod OT Treatments $Self Care/Home Management : 8-22 mins  Lou Cal, OT Supplemental Rehabilitation Services Pager [phone] Office [phone]  Raymondo Band 02/19/2019, 2:16 PM

## 2019-02-19 NOTE — Progress Notes (Signed)
Family Medicine Teaching Service Daily Progress Note Intern Pager: [phone]  Patient name: Holly Acosta record number: [id number] Date of birth: 08-03-1955 Age: 64 y.o. Gender: female  Primary Care Provider: Daisy Floro, DO Consultants: None Code Status:   Code Status: Full Code  Pt Overview and Major Events to Date:  Holly Acosta is a 64 y.o. female presenting with acute hypoxic respiratory failure in setting of COVID PNA. PMH is significant for T2DM, HTN, HLD, OSA, prior CVA and CAD.    Sepsis 2/2 COVID PNA  improved Blood pressures ranging from 120-140/70.  Afebrile past 24 hours.  O2 saturations improved setting 93 on 2 L.  Ambulated without oxygen and did desaturate to 86%.  WBC is elevated today from 8.1-12.3.  CRP stable at 5.9.  D-dimer slightly increased from 0.5-0.9.  Will need DME O2 on discharge.  Patient likely stable for discharge for Covid home monitoring program.  Outpatient IV remdesivir infusions set up as well.  Antibiotics were discontinued yesterday.  No growth on blood cultures within 12 hours.  Urine culture pending.  Unlikely bacterial sepsis.  PT recommends no follow-up.  AST ALT slightly elevated in the 40s and 50s, likely due to remdesivir. -airborne and contact precautions -decadron 6mg  (1/28 - Feb 7) -remdesivir IV (1/28 - Feb 2) -azithromycin (1/29-) & CTX (1/29-) -AM CBC, CMP -daily d dimer and CRP -supplemental oxygen PRN with goal >92%  -continuous pulse oximetry    AKI, improved Cr 2.16 > 1.92.  Tolerating p.o.  Urine output normal. -Recommend repeat CMP in the outpatient setting to follow creatinine.   T2DM  Hyperglycemia , poorly controlled A1c 13 on admission.  Home insulin regimen includes lantus 32 units daily, metformin 1000 BID.   CBGs the past 24 hours have been 319-403. -SSIr -CBG 4 times daily  -Increase Lantus to 30 -Linagliptin 5 mg   HTN BP starting to become mildly hypertensive 148/79 last three.  Home meds amlodipine  5, lisinopril 20mg , carvedilol 12.5 mg bid.  -We will likely restart amlodipine 5 mg and reorder carvedilol at 6.25 -hold lisinopril in setting of AKI   OSA on BiPAP Patient noted to use BiPAP nightly at home with 93% compliance record as of 02/10/19. -patient will be unable to use BiPAP as she is COVID + -will continue to monitor pulse oxygenation overnight with vitals and continuous pulse ox    HLD Last cholesterol panel in 4/19 with cholesterol 173, HDL 34, LDL 107. 02/17/19 TG 144. Home meds include rosuvastatin 40 daily.  -continue home rosuvastatin    CAD Hx of NSTEMI in 04/2017  Hx of PCI placement in April of 2019 for NSTEMI. On admission, patient denies chest pain. EKG shows normal sinus rhythm, no ST elevation, QTc of 492. Home meds ASA 81mg  & Brilinta 90.  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 No neurological deficits on exam. Appears to follow with [facility] as outpatient.  -continue ASA  and Brilinta   FEN/GI: carb modified  Prophylaxis:  Heparin subcu    Disposition:  Transfer to med telemetry, likely discharged today with Covid at home   Subjective:  Patient states that she feels better.  Denies any trouble breathing with the current oxygen.  She is up eating breakfast without issue.  Objective: Temp:  [97.4 F (  36.3 C)-98.5 F (36.9 C)] 97.8 F (36.6 C) (01/30 0430) Pulse Rate:  [62-82] 72 (01/30 0745) Resp:  [14-29] 22 (01/30 0745) BP: (117-148)/(72-79) 148/79 (01/30 0745) SpO2:  [88 %-94 %] 93 % (01/30 0745) Physical Exam: General: No acute distress, resting well in bed Cardiovascular: RRR, no MRG Respiratory: Normal work of breathing, 2 L nasal cannula in place Abdomen: Soft, nontender, nondistended Extremities: Trace lower extremity edema, moves neck  spontaneously  Laboratory: Recent Labs  Lab 02/17/19 2328 02/18/19 0439 02/19/19 0500  WBC 7.7 8.1 12.3*  HGB 12.2 11.4* 11.6*  HCT 38.7 36.4 37.1  PLT 190 157 234   Recent Labs  Lab 02/17/19 2328 02/18/19 0439 02/19/19 0500  NA 129* 134* 135  K 3.7 4.0 4.3  CL 101 105 106  CO2 15* 16* 18*  BUN 20 22 32*  CREATININE 2.25* 2.16* 1.92*  CALCIUM 8.5* 7.9* 8.6*  PROT 6.6 5.8* 6.6  BILITOT 0.6 0.6 0.2*  ALKPHOS 35* 33* 42  ALT 29 30 46*  AST 35 40 56*  GLUCOSE 318* 340* 286*      Imaging/Diagnostic Tests: DG Chest Port 1 View  Result Date: 02/18/2019 CLINICAL DATA:  Fever, diaphoretic and weakness, unable to communicate EXAM: PORTABLE CHEST 1 VIEW COMPARISON:  Radiograph 04/28/2017 FINDINGS: Multifocal patchy opacities throughout both lungs. No pneumothorax or effusion. No convincing features of edema. Accentuation of the cardiomediastinal contours is likely secondary to low volumes, portable technique and left anterior oblique rotation. The aorta is slightly calcified. No acute osseous or soft tissue abnormality. Degenerative changes are present in the imaged spine and shoulders. IMPRESSION: Multifocal patchy opacities throughout both lungs. Findings worrisome for multifocal pneumonia including atypical infection such as COVID-19. Electronically Signed   By: Lovena Le M.D.   On: 02/18/2019 00:08    Bonnita Hollow, MD 02/19/2019, 8:17 AM PGY-3, [facility] Intern pager: [phone], text pages welcome

## 2019-02-19 NOTE — Progress Notes (Signed)
Holly Acosta to be D/C'd home per MD order. Discussed with the patient and all questions fully answered. Discussed discharge instructions with daughter, Holly Acosta.  VVS, Skin clean, dry and intact without evidence of skin break down, no evidence of skin tears noted.  IV catheter discontinued intact. Site without signs and symptoms of complications. Dressing and pressure applied.  An After Visit Summary was printed and given to the patient.  Patient escorted via [place], and D/C home via private auto.  Holly Acosta  02/19/2019 4:55 PM

## 2019-02-19 NOTE — Progress Notes (Signed)
When patient was brought downstairs for discharge, RN who was transporting patient was made aware by patient's daughter that she does not have the oxygen adapter for her portable oxygen tank that was delivered to her home. Patient has been maintaining oxygen saturations around 88-90% on room air and has not been wearing oxygen consistently while in hospital. Patient and daughter both comfortable with transporting home without oxygen since it is only a 15 minute ride. Patient's daughter stated she has all equipment at home for the patient to use the larger tank until [employer] can deliver the adapter for the portable monitor.

## 2019-02-19 NOTE — Care Management (Addendum)
Home oxygen set up through [place]. Patients daughter Warren Lacy is contat for home delivery of O2. She will let them in to set up it up at the house (address verified) and bring tank to hospital for transportation home.  [place] called to say that her home O2 has been delivered    [place], Warren Lacy Daughter   [phone]

## 2019-02-20 ENCOUNTER — Ambulatory Visit (HOSPITAL_COMMUNITY)
Admission: RE | Admit: 2019-02-20 | Discharge: 2019-02-20 | Disposition: A | Discharge status: Home / Self Care | Payer: Medicaid Other | Source: Ambulatory Visit | Attending: Pulmonary Disease | Admitting: Pulmonary Disease

## 2019-02-20 ENCOUNTER — Encounter (HOSPITAL_COMMUNITY): Payer: Self-pay

## 2019-02-20 DIAGNOSIS — U071 COVID-19: Secondary | ICD-10-CM | POA: Diagnosis not present

## 2019-02-20 DIAGNOSIS — J1282 Pneumonia due to coronavirus disease 2019: Secondary | ICD-10-CM | POA: Diagnosis not present

## 2019-02-20 MED ORDER — SODIUM CHLORIDE 0.9 % IV SOLN: INTRAVENOUS | Status: DC | PRN

## 2019-02-20 MED ORDER — ALBUTEROL SULFATE HFA 108 (90 BASE) MCG/ACT IN AERS: 2.0000 | INHALATION_SPRAY | Freq: Once | RESPIRATORY_TRACT | Status: DC | PRN

## 2019-02-20 MED ORDER — METHYLPREDNISOLONE SODIUM SUCC 125 MG IJ SOLR: 125.0000 mg | Freq: Once | INTRAMUSCULAR | Status: DC | PRN

## 2019-02-20 MED ORDER — EPINEPHRINE 0.3 MG/0.3ML IJ SOAJ: 0.3000 mg | Freq: Once | INTRAMUSCULAR | Status: DC | PRN

## 2019-02-20 MED ORDER — DIPHENHYDRAMINE HCL 50 MG/ML IJ SOLN: 50.0000 mg | Freq: Once | INTRAMUSCULAR | Status: DC | PRN

## 2019-02-20 MED ORDER — SODIUM CHLORIDE 0.9 % IV SOLN
INTRAVENOUS | Status: AC
  Filled 2019-02-20: qty 20

## 2019-02-20 MED ORDER — SODIUM CHLORIDE 0.9 % IV SOLN
100.0000 mg | Freq: Once | INTRAVENOUS | Status: AC
  Administered 2019-02-20: 100 mg via INTRAVENOUS

## 2019-02-20 MED ORDER — FAMOTIDINE IN NACL 20-0.9 MG/50ML-% IV SOLN: 20.0000 mg | Freq: Once | INTRAVENOUS | Status: DC | PRN

## 2019-02-20 NOTE — Progress Notes (Addendum)
  Diagnosis: COVID-19  Physician: Dr. Joya Gaskins  Procedure: Covid Infusion Clinic Med: remdesivir infusion.  Complications: No immediate complications noted.  Discharge: Discharged home   [place] 02/20/2019

## 2019-02-21 ENCOUNTER — Ambulatory Visit: Payer: Medicaid Other | Admitting: Obstetrics & Gynecology

## 2019-02-21 ENCOUNTER — Ambulatory Visit (HOSPITAL_COMMUNITY)
Admission: RE | Admit: 2019-02-21 | Discharge: 2019-02-21 | Disposition: A | Discharge status: Home / Self Care | Payer: Medicaid Other | Source: Ambulatory Visit | Attending: Pulmonary Disease | Admitting: Pulmonary Disease

## 2019-02-21 DIAGNOSIS — U071 COVID-19: Secondary | ICD-10-CM | POA: Diagnosis not present

## 2019-02-21 DIAGNOSIS — J1282 Pneumonia due to coronavirus disease 2019: Secondary | ICD-10-CM | POA: Diagnosis present

## 2019-02-21 MED ORDER — DIPHENHYDRAMINE HCL 50 MG/ML IJ SOLN: 50.0000 mg | Freq: Once | INTRAMUSCULAR | Status: DC | PRN

## 2019-02-21 MED ORDER — METHYLPREDNISOLONE SODIUM SUCC 125 MG IJ SOLR: 125.0000 mg | Freq: Once | INTRAMUSCULAR | Status: DC | PRN

## 2019-02-21 MED ORDER — FAMOTIDINE IN NACL 20-0.9 MG/50ML-% IV SOLN: 20.0000 mg | Freq: Once | INTRAVENOUS | Status: DC | PRN

## 2019-02-21 MED ORDER — SODIUM CHLORIDE 0.9 % IV SOLN
INTRAVENOUS | Status: AC
  Filled 2019-02-21: qty 20

## 2019-02-21 MED ORDER — SODIUM CHLORIDE 0.9 % IV SOLN: INTRAVENOUS | Status: DC | PRN

## 2019-02-21 MED ORDER — ALBUTEROL SULFATE HFA 108 (90 BASE) MCG/ACT IN AERS: 2.0000 | INHALATION_SPRAY | Freq: Once | RESPIRATORY_TRACT | Status: DC | PRN

## 2019-02-21 MED ORDER — SODIUM CHLORIDE 0.9 % IV SOLN: 100.0000 mg | Freq: Once | INTRAVENOUS | Status: DC

## 2019-02-21 MED ORDER — EPINEPHRINE 0.3 MG/0.3ML IJ SOAJ: 0.3000 mg | Freq: Once | INTRAMUSCULAR | Status: DC | PRN

## 2019-02-21 NOTE — Progress Notes (Signed)
  Diagnosis: COVID-19  Physician: Dr. Joya Gaskins  Procedure: Covid Infusion Clinic Med: remdesivir infusion.  Complications: No immediate complications noted.  Discharge: Discharged home   Holly Acosta 02/21/2019

## 2019-02-22 ENCOUNTER — Ambulatory Visit (HOSPITAL_COMMUNITY)
Admit: 2019-02-22 | Discharge: 2019-02-22 | Disposition: A | Discharge status: Home / Self Care | Payer: Medicaid Other | Attending: Pulmonary Disease | Admitting: Pulmonary Disease

## 2019-02-22 DIAGNOSIS — U071 COVID-19: Secondary | ICD-10-CM | POA: Diagnosis not present

## 2019-02-22 MED ORDER — ALBUTEROL SULFATE HFA 108 (90 BASE) MCG/ACT IN AERS: 2.0000 | INHALATION_SPRAY | Freq: Once | RESPIRATORY_TRACT | Status: DC | PRN

## 2019-02-22 MED ORDER — METHYLPREDNISOLONE SODIUM SUCC 125 MG IJ SOLR: 125.0000 mg | Freq: Once | INTRAMUSCULAR | Status: DC | PRN

## 2019-02-22 MED ORDER — DIPHENHYDRAMINE HCL 50 MG/ML IJ SOLN: 50.0000 mg | Freq: Once | INTRAMUSCULAR | Status: DC | PRN

## 2019-02-22 MED ORDER — FAMOTIDINE IN NACL 20-0.9 MG/50ML-% IV SOLN: 20.0000 mg | Freq: Once | INTRAVENOUS | Status: DC | PRN

## 2019-02-22 MED ORDER — SODIUM CHLORIDE 0.9 % IV SOLN
INTRAVENOUS | Status: AC
  Filled 2019-02-22: qty 20

## 2019-02-22 MED ORDER — SODIUM CHLORIDE 0.9 % IV SOLN
INTRAVENOUS | Status: DC | PRN
  Administered 2019-02-22: 250 mL via INTRAVENOUS

## 2019-02-22 MED ORDER — SODIUM CHLORIDE 0.9 % IV SOLN
100.0000 mg | Freq: Once | INTRAVENOUS | Status: AC
  Administered 2019-02-22: 100 mg via INTRAVENOUS

## 2019-02-22 MED ORDER — EPINEPHRINE 0.3 MG/0.3ML IJ SOAJ: 0.3000 mg | Freq: Once | INTRAMUSCULAR | Status: DC | PRN

## 2019-02-22 NOTE — Progress Notes (Signed)
[facility] Telemedicine Visit  Patient consented to have virtual visit. Method of visit: Video  Encounter participants: Patient: Holly Acosta - located at home Provider: Caroline More - located at [facility] Others (if applicable): None  Chief Complaint: hospital follow up   HPI: Sepsis 2/2COVID PNA Patient initially admitted to the family medicine service for coronavirus.  She required O2 supplementation and received remdesivir and Decadron during hospitalization.  She was initially meeting sepsis criteria.  Does report improvement since discharge.  States she "feels good".  Is breathing normally.  Is using supplemental oxygen as needed at home but has not had to use in the past 1 to 2 days.  Denies any oxygen use even on exertion.  Has a Covid RN come to the house every day he checks her vitals and O2 saturations have been above 95%.  Is able to use her BiPAP machine at nighttime.  Acute Renal Failure Patient with elevated creatinine on admission but improved throughout hospital course.  Has been drinking lots of water since going home.  There was an error with her discharge orders that she continue to take her lisinopril at her normal dose.  T2DM Hyperglycemia Patient with elevated glucose secondary to steroid use.  Recent glucoses have been 262 this morning.  Is taking Lantus, sliding scale insulin, linagliptin.  HTN Patient continues to take her amlodipine, lisinopril, carvedilol.  Does report her home RN is taking her blood pressures.  Last blood pressure was 137/84.  ROS: per HPI  Pertinent PMHx: COVID, HTN, CAD, OSA, T2DM, AKI  Exam:  Gen: NAD Respiratory: speaking full sentences, no increased WOB  Assessment/Plan:  Pneumonia due to COVID-19 virus Improved. No longer requiring supplemental O2 use. Has COVID RN coming to the house daily and O2 sats have been >95%.  Has completed home remdesivir.  Overall doing well.  Strict return precautions  given.  AKI (acute kidney injury) ([place]) There was an error in her discharge orders and she was continued on lisinopril despite needing to stop this due to her AKI.  I advised that she decrease her lisinopril dose to 10 mg.  I advised follow-up in 1 week at which time if physician deems her appropriate, she can come in and have labs obtained in her car while phlebotomist is in full PPE to check her renal function.  Advised she continue to stay well-hydrated and drink plenty of fluids.  DM (diabetes mellitus), type 2, uncontrolled (HCC) Elevated glucose in the setting of steroid use.  Is currently taking Lantus, 5 skeletal, linagliptin.  Unable to obtain renal function today, but if renal function improves can consider restarting her home Metformin.  Continue to check sugars regularly.  Follow-up in 1 week.  HYPERTENSION, BENIGN ESSENTIAL Well-controlled.  Continue to check blood pressures at home.  Follow-up in 1 week.   Discussed with Dr. Andria Frames  Time spent during visit with patient: 20 minutes

## 2019-02-22 NOTE — Progress Notes (Signed)
  Diagnosis: COVID-19  Physician: Dr. Joya Gaskins  Procedure: [facility] Med: remdesivir infusion.  Complications: No immediate complications noted.  Discharge: Discharged home   Babs Sciara 02/22/2019

## 2019-02-23 ENCOUNTER — Telehealth (INDEPENDENT_AMBULATORY_CARE_PROVIDER_SITE_OTHER): Payer: Medicaid Other | Admitting: Family Medicine

## 2019-02-23 ENCOUNTER — Other Ambulatory Visit: Payer: Self-pay

## 2019-02-23 ENCOUNTER — Other Ambulatory Visit: Payer: Self-pay | Admitting: *Deleted

## 2019-02-23 DIAGNOSIS — Z8616 Personal history of COVID-19: Secondary | ICD-10-CM

## 2019-02-23 DIAGNOSIS — I10 Essential (primary) hypertension: Secondary | ICD-10-CM | POA: Diagnosis not present

## 2019-02-23 DIAGNOSIS — E1165 Type 2 diabetes mellitus with hyperglycemia: Secondary | ICD-10-CM

## 2019-02-23 DIAGNOSIS — N179 Acute kidney failure, unspecified: Secondary | ICD-10-CM

## 2019-02-23 DIAGNOSIS — Z8701 Personal history of pneumonia (recurrent): Secondary | ICD-10-CM | POA: Diagnosis not present

## 2019-02-23 DIAGNOSIS — Z09 Encounter for follow-up examination after completed treatment for conditions other than malignant neoplasm: Secondary | ICD-10-CM

## 2019-02-23 DIAGNOSIS — U071 COVID-19: Secondary | ICD-10-CM

## 2019-02-23 LAB — CULTURE, BLOOD (ROUTINE X 2)
Culture: NO GROWTH
Culture: NO GROWTH
Special Requests: ADEQUATE

## 2019-02-23 MED ORDER — BD PEN NEEDLE MINI U/F 31G X 5 MM MISC: 2 refills | Status: DC

## 2019-02-24 ENCOUNTER — Telehealth: Payer: Self-pay

## 2019-02-24 NOTE — Assessment & Plan Note (Signed)
Improved. No longer requiring supplemental O2 use. Has COVID RN coming to the house daily and O2 sats have been >95%.  Has completed home remdesivir.  Overall doing well.  Strict return precautions given.

## 2019-02-24 NOTE — Assessment & Plan Note (Signed)
There was an error in her discharge orders and she was continued on lisinopril despite needing to stop this due to her AKI.  I advised that she decrease her lisinopril dose to 10 mg.  I advised follow-up in 1 week at which time if physician deems her appropriate, she can come in and have labs obtained in her car while phlebotomist is in full PPE to check her renal function.  Advised she continue to stay well-hydrated and drink plenty of fluids.

## 2019-02-24 NOTE — Assessment & Plan Note (Signed)
Elevated glucose in the setting of steroid use.  Is currently taking Lantus, 5 skeletal, linagliptin.  Unable to obtain renal function today, but if renal function improves can consider restarting her home Metformin.  Continue to check sugars regularly.  Follow-up in 1 week.

## 2019-02-24 NOTE — Assessment & Plan Note (Signed)
Well-controlled.  Continue to check blood pressures at home.  Follow-up in 1 week.

## 2019-02-24 NOTE — Assessment & Plan Note (Signed)
>>  ASSESSMENT AND PLAN FOR UNCONTROLLED TYPE 2 DIABETES MELLITUS WITH HYPERGLYCEMIA (HCC) WRITTEN ON 02/24/2019 12:16 PM BY ABRAHAM, SHERIN, DO  Elevated glucose in the setting of steroid use.  Is currently taking Lantus, 5 skeletal, linagliptin.  Unable to obtain renal function today, but if renal function improves can consider restarting her home Metformin.  Continue to check sugars regularly.  Follow-up in 1 week.

## 2019-02-24 NOTE — Assessment & Plan Note (Signed)
>>  ASSESSMENT AND PLAN FOR HYPERTENSION, BENIGN ESSENTIAL WRITTEN ON 02/24/2019 12:16 PM BY Darin Engels, SHERIN, DO  Well-controlled.  Continue to check blood pressures at home.  Follow-up in 1 week.

## 2019-02-24 NOTE — Telephone Encounter (Signed)
Patient calls nurse line stating she would rather her home health nurse draw her labs. Patient stated this was an option when she spoke with provider yesterday. Please put in future orders and I will alert home health RN.    Homehealth RN [phone]

## 2019-02-28 ENCOUNTER — Encounter: Payer: Self-pay | Admitting: Family Medicine

## 2019-02-28 ENCOUNTER — Other Ambulatory Visit: Payer: Medicaid Other

## 2019-02-28 ENCOUNTER — Other Ambulatory Visit: Payer: Self-pay

## 2019-02-28 ENCOUNTER — Telehealth (INDEPENDENT_AMBULATORY_CARE_PROVIDER_SITE_OTHER): Payer: Medicaid Other | Admitting: Family Medicine

## 2019-02-28 ENCOUNTER — Other Ambulatory Visit: Payer: Self-pay | Admitting: Family Medicine

## 2019-02-28 VITALS — BP 129/87

## 2019-02-28 DIAGNOSIS — I10 Essential (primary) hypertension: Secondary | ICD-10-CM

## 2019-02-28 DIAGNOSIS — U071 COVID-19: Secondary | ICD-10-CM | POA: Diagnosis not present

## 2019-02-28 DIAGNOSIS — N179 Acute kidney failure, unspecified: Secondary | ICD-10-CM

## 2019-02-28 DIAGNOSIS — Z09 Encounter for follow-up examination after completed treatment for conditions other than malignant neoplasm: Secondary | ICD-10-CM

## 2019-02-28 DIAGNOSIS — J1282 Pneumonia due to coronavirus disease 2019: Secondary | ICD-10-CM

## 2019-02-28 DIAGNOSIS — E1169 Type 2 diabetes mellitus with other specified complication: Secondary | ICD-10-CM

## 2019-02-28 DIAGNOSIS — D649 Anemia, unspecified: Secondary | ICD-10-CM

## 2019-02-28 DIAGNOSIS — E1165 Type 2 diabetes mellitus with hyperglycemia: Secondary | ICD-10-CM | POA: Diagnosis not present

## 2019-02-28 DIAGNOSIS — R7401 Elevation of levels of liver transaminase levels: Secondary | ICD-10-CM

## 2019-02-28 DIAGNOSIS — E785 Hyperlipidemia, unspecified: Secondary | ICD-10-CM

## 2019-02-28 DIAGNOSIS — R7989 Other specified abnormal findings of blood chemistry: Secondary | ICD-10-CM

## 2019-02-28 NOTE — Progress Notes (Signed)
[facility] Telemedicine Visit  Patient consented to have virtual visit. Method of visit: Video was attempted, but technology challenges prevented patient from using video, so visit was conducted via telephone.  Encounter participants: Patient: Holly Acosta - located at home Provider: Bonnita Hollow - located at office Others (if applicable): None  Chief Complaint: Continued hospital follow-up due to COVID-19  HPI:  Patient is checking back in due to uncontrolled diabetes and follow-up due to recent discharge from hospital due to COVID-19 pneumonia.  Diabetes mellitus type 2 Fasting CBGs in the 380s.  She is continued on her linagliptin 5 mg, Lantus 38 units and sliding scale insulin.  Denies any polyuria polydipsia.  Hypertension Patient discharged home amlodipine, carvedilol, lisinopril.  Lisinopril was discontinued at last patient visit due to elevated creatinine on this hospital discharge.  Patient has repeat labs that she is getting tomorrow.  Her current home blood pressure is  Vitals:   02/28/19 1350  BP: 129/87     ROS: per HPI  Pertinent PMHx:  As above   Exam:  Respiratory: Able to speak in full sentences without issue, she is on room air  Assessment/Plan:  Transaminasemia Elevated due to remdesivir.  CMP to trend.  Uncontrolled type 2 diabetes mellitus with hyperglycemia (HCC) Elevated fasting CBGs in the 380s.  Patient currently taking linagliptin 5 mg, Lantus 38 units, sliding scale aspart.  -Continue linagliptin 5 mg and SSI -Continue to hold Metformin -Increase Lantus to 42 units -We are checking creatinine due to AKI, if comes back decreased, will likely add SGLT2 and restart home Metformin -Follow-up in 2 weeks to discuss CBGs -Would benefit follow-up with Dr. Valentina Lucks -Lipid panel per pharmacy recommendations  AKI (acute kidney injury) ([place]) Likely prerenal in setting COVID-19. -CMP tomorrow -Hold home lisinopril, if  creatinine improves, restart  HYPERTENSION, BENIGN ESSENTIAL Normotensive at home on amlodipine and carvedilol.  Patient still taking lisinopril 10 mg, however was recommend to be discontinued due to AKI -Repeat CMP tomorrow -Continue amlodipine and carvedilol -Hold lisinopril -If creatinine improved, restart lisinopril  Pneumonia due to COVID-19 virus Breathing well on room air.  Quarantine isolation ends on 2/19    Time spent during visit with patient: 15 minutes

## 2019-02-28 NOTE — Assessment & Plan Note (Signed)
Elevated due to remdesivir.  CMP to trend.

## 2019-02-28 NOTE — Assessment & Plan Note (Addendum)
Likely prerenal in setting COVID-19. -CMP tomorrow -Hold home lisinopril, if creatinine improves, restart

## 2019-02-28 NOTE — Assessment & Plan Note (Signed)
>>  ASSESSMENT AND PLAN FOR HYPERTENSION, BENIGN ESSENTIAL WRITTEN ON 02/28/2019  3:55 PM BY Fanny Bien B, MD  Normotensive at home on amlodipine and carvedilol.  Patient still taking lisinopril 10 mg, however was recommend to be discontinued due to AKI -Repeat CMP tomorrow -Continue amlodipine and carvedilol -Hold lisinopril -If creatinine improved, restart lisinopril

## 2019-02-28 NOTE — Assessment & Plan Note (Signed)
Breathing well on room air.  Quarantine isolation ends on 2/19

## 2019-02-28 NOTE — Assessment & Plan Note (Signed)
>>  ASSESSMENT AND PLAN FOR UNCONTROLLED TYPE 2 DIABETES MELLITUS WITH HYPERGLYCEMIA (HCC) WRITTEN ON 02/28/2019  3:53 PM BY THOMPSON, AARON B, MD  Elevated fasting CBGs in the 380s.  Patient currently taking linagliptin 5 mg, Lantus 38 units, sliding scale aspart.  -Continue linagliptin 5 mg and SSI -Continue to hold Metformin -Increase Lantus to 42 units -We are checking creatinine due to AKI, if comes back decreased, will likely add SGLT2 and restart home Metformin -Follow-up in 2 weeks to discuss CBGs -Would benefit follow-up with Dr. Raymondo Band -Lipid panel per pharmacy recommendations

## 2019-02-28 NOTE — Progress Notes (Signed)
Called and spoke with patient, she said home health never came out to draw her labs. She prefers to come tomorrow to have her labs here at [facility]. I explained to her that she needs to remain in her car and call in when she gets here. I will go out to her vehicle to draw her labs since she is covid positive and cannot enter the building. Jaymes Graff Jamiesha Victoria

## 2019-02-28 NOTE — Assessment & Plan Note (Addendum)
Elevated fasting CBGs in the 380s.  Patient currently taking linagliptin 5 mg, Lantus 38 units, sliding scale aspart.  -Continue linagliptin 5 mg and SSI -Continue to hold Metformin -Increase Lantus to 42 units -We are checking creatinine due to AKI, if comes back decreased, will likely add SGLT2 and restart home Metformin -Follow-up in 2 weeks to discuss CBGs -Would benefit follow-up with Dr. Valentina Lucks -Lipid panel per pharmacy recommendations

## 2019-02-28 NOTE — Assessment & Plan Note (Signed)
Normotensive at home on amlodipine and carvedilol.  Patient still taking lisinopril 10 mg, however was recommend to be discontinued due to AKI -Repeat CMP tomorrow -Continue amlodipine and carvedilol -Hold lisinopril -If creatinine improved, restart lisinopril

## 2019-03-01 ENCOUNTER — Other Ambulatory Visit: Payer: Self-pay

## 2019-03-01 ENCOUNTER — Other Ambulatory Visit: Payer: Medicaid Other

## 2019-03-01 ENCOUNTER — Other Ambulatory Visit: Payer: Self-pay | Admitting: Family Medicine

## 2019-03-01 ENCOUNTER — Other Ambulatory Visit: Payer: Self-pay | Admitting: Cardiology

## 2019-03-01 DIAGNOSIS — D649 Anemia, unspecified: Secondary | ICD-10-CM

## 2019-03-01 DIAGNOSIS — R7989 Other specified abnormal findings of blood chemistry: Secondary | ICD-10-CM

## 2019-03-01 DIAGNOSIS — E1169 Type 2 diabetes mellitus with other specified complication: Secondary | ICD-10-CM

## 2019-03-01 DIAGNOSIS — E785 Hyperlipidemia, unspecified: Secondary | ICD-10-CM

## 2019-03-01 DIAGNOSIS — N179 Acute kidney failure, unspecified: Secondary | ICD-10-CM

## 2019-03-02 ENCOUNTER — Other Ambulatory Visit: Payer: Self-pay | Admitting: Family Medicine

## 2019-03-02 DIAGNOSIS — E1165 Type 2 diabetes mellitus with hyperglycemia: Secondary | ICD-10-CM

## 2019-03-02 LAB — COMPREHENSIVE METABOLIC PANEL
ALT: 21 IU/L (ref 0–32)
AST: 12 IU/L (ref 0–40)
Albumin/Globulin Ratio: 1.5 (ref 1.2–2.2)
Albumin: 3.1 g/dL — ABNORMAL LOW (ref 3.8–4.8)
Alkaline Phosphatase: 162 IU/L — ABNORMAL HIGH (ref 39–117)
BUN/Creatinine Ratio: 17 (ref 12–28)
BUN: 18 mg/dL (ref 8–27)
Bilirubin Total: 0.2 mg/dL (ref 0.0–1.2)
CO2: 23 mmol/L (ref 20–29)
Calcium: 9 mg/dL (ref 8.7–10.3)
Chloride: 97 mmol/L (ref 96–106)
Creatinine, Ser: 1.07 mg/dL — ABNORMAL HIGH (ref 0.57–1.00)
GFR calc Af Amer: 64 mL/min/{1.73_m2} (ref >59)
GFR calc non Af Amer: 55 mL/min/{1.73_m2} — ABNORMAL LOW (ref >59)
Globulin, Total: 2.1 g/dL (ref 1.5–4.5)
Glucose: 413 mg/dL — ABNORMAL HIGH (ref 65–99)
Potassium: 4.3 mmol/L (ref 3.5–5.2)
Sodium: 132 mmol/L — ABNORMAL LOW (ref 134–144)
Total Protein: 5.2 g/dL — ABNORMAL LOW (ref 6.0–8.5)

## 2019-03-02 LAB — CBC WITH DIFFERENTIAL/PLATELET
Basophils Absolute: 0.1 10*3/uL (ref 0.0–0.2)
Basos: 1 %
EOS (ABSOLUTE): 0.1 10*3/uL (ref 0.0–0.4)
Eos: 1 %
Hemoglobin: 11.8 g/dL (ref 11.1–15.9)
Immature Grans (Abs): 0.5 10*3/uL — ABNORMAL HIGH (ref 0.0–0.1)
Immature Granulocytes: 5 %
Lymphocytes Absolute: 1.7 10*3/uL (ref 0.7–3.1)
Lymphs: 17 %
MCH: 27.8 pg (ref 26.6–33.0)
MCHC: 31.9 g/dL (ref 31.5–35.7)
MCV: 87 fL (ref 79–97)
Monocytes Absolute: 0.5 10*3/uL (ref 0.1–0.9)
Monocytes: 5 %
Neutrophils Absolute: 7.3 10*3/uL — ABNORMAL HIGH (ref 1.4–7.0)
Neutrophils: 71 %
Platelets: 350 10*3/uL (ref 150–450)
RBC: 4.24 x10E6/uL (ref 3.77–5.28)
RDW: 13.7 % (ref 11.7–15.4)
WBC: 10.2 10*3/uL (ref 3.4–10.8)

## 2019-03-02 LAB — ANEMIA PANEL
Ferritin: 324 ng/mL — ABNORMAL HIGH (ref 15–150)
Folate, Hemolysate: 289 ng/mL
Folate, RBC: 781 ng/mL (ref >498)
Hematocrit: 37 % (ref 34.0–46.6)
Iron Saturation: 17 % (ref 15–55)
Iron: 36 ug/dL (ref 27–139)
Retic Ct Pct: 1.5 % (ref 0.6–2.6)
Total Iron Binding Capacity: 217 ug/dL — ABNORMAL LOW (ref 250–450)
UIBC: 181 ug/dL (ref 118–369)
Vitamin B-12: 281 pg/mL (ref 232–1245)

## 2019-03-02 LAB — LIPID PANEL
Chol/HDL Ratio: 3.7 ratio (ref 0.0–4.4)
Cholesterol, Total: 144 mg/dL (ref 100–199)
HDL: 39 mg/dL — ABNORMAL LOW (ref >39)
LDL Chol Calc (NIH): 58 mg/dL (ref 0–99)
Triglycerides: 303 mg/dL — ABNORMAL HIGH (ref 0–149)
VLDL Cholesterol Cal: 47 mg/dL — ABNORMAL HIGH (ref 5–40)

## 2019-03-02 MED ORDER — DAPAGLIFLOZIN PROPANEDIOL 10 MG PO TABS: 10.0000 mg | ORAL_TABLET | Freq: Every day | ORAL | 3 refills | Status: DC

## 2019-03-02 MED ORDER — LISINOPRIL 10 MG PO TABS: 10.0000 mg | ORAL_TABLET | Freq: Every day | ORAL | 11 refills | Status: DC

## 2019-03-02 MED ORDER — METFORMIN HCL ER (OSM) 1000 MG PO TB24: 1000.0000 mg | ORAL_TABLET | Freq: Two times a day (BID) | ORAL | 11 refills | Status: DC

## 2019-03-03 ENCOUNTER — Telehealth: Payer: Self-pay | Admitting: *Deleted

## 2019-03-03 NOTE — Telephone Encounter (Signed)
Metformin ER (fortamet) not covered by Medicaid.  Please see below of formulary.  Let "RN Team" know if you are changing to covered medications or would like to pursue a PA. Christen Bame, CMA

## 2019-03-14 ENCOUNTER — Ambulatory Visit: Payer: Medicaid Other | Admitting: Family Medicine

## 2019-03-14 NOTE — Telephone Encounter (Signed)
Checking status.  Holly Acosta, CMA  

## 2019-03-15 ENCOUNTER — Other Ambulatory Visit: Payer: Self-pay | Admitting: Cardiology

## 2019-03-15 NOTE — Telephone Encounter (Signed)
Received 2nd fax from pharmacy. Christen Bame, CMA

## 2019-03-17 NOTE — Telephone Encounter (Signed)
Mitzi Hansen from [facility] calling re: Metformin 100 ER. Insurance won't pay for 1000 ER but will pay for 500 ER. Unless you want to do a PA. Please let Mitzi Hansen know what you would like done. Ottis Stain, CMA

## 2019-03-18 ENCOUNTER — Encounter: Payer: Self-pay | Admitting: Family Medicine

## 2019-03-18 ENCOUNTER — Ambulatory Visit (INDEPENDENT_AMBULATORY_CARE_PROVIDER_SITE_OTHER): Payer: Medicaid Other | Admitting: Family Medicine

## 2019-03-18 ENCOUNTER — Other Ambulatory Visit: Payer: Self-pay

## 2019-03-18 VITALS — BP 120/64 | HR 88 | Ht 64.0 in | Wt 167.0 lb

## 2019-03-18 DIAGNOSIS — E1169 Type 2 diabetes mellitus with other specified complication: Secondary | ICD-10-CM

## 2019-03-18 DIAGNOSIS — Z23 Encounter for immunization: Secondary | ICD-10-CM

## 2019-03-18 DIAGNOSIS — E785 Hyperlipidemia, unspecified: Secondary | ICD-10-CM | POA: Diagnosis not present

## 2019-03-18 DIAGNOSIS — E1165 Type 2 diabetes mellitus with hyperglycemia: Secondary | ICD-10-CM | POA: Diagnosis present

## 2019-03-18 MED ORDER — METFORMIN HCL ER (OSM) 1000 MG PO TB24: 1000.0000 mg | ORAL_TABLET | Freq: Two times a day (BID) | ORAL | 11 refills | Status: DC

## 2019-03-18 NOTE — Assessment & Plan Note (Signed)
Improved since recent COVID-19 infection where glucose corticoids increased insulin needs -Restart Metformin 1000 mg twice daily -Continue home Lantus, linagliptin, dapagliflozin -Follow-up with pharmacy in 1 month

## 2019-03-18 NOTE — Patient Instructions (Signed)
It was a pleasure to see you today! Thank you for choosing [facility] for your primary care. Holly Acosta was seen for diabetes management.  1. Restart metformin, rosuvastatin, and lisinopril.  2. Continue taking the lantus, litagliptin, dapaglaflozin.  3. Follow up with Dr. Valentina [facility] in 4 weeks  Best,  Marny Lowenstein, MD, [place] - PGY3 03/18/2019 10:22 AM

## 2019-03-18 NOTE — Assessment & Plan Note (Signed)
>>  ASSESSMENT AND PLAN FOR UNCONTROLLED TYPE 2 DIABETES MELLITUS WITH HYPERGLYCEMIA (HCC) WRITTEN ON 03/18/2019 11:44 AM BY Fanny Bien B, MD  Improved since recent COVID-19 infection where glucose corticoids increased insulin needs -Restart Metformin 1000 mg twice daily -Continue home Lantus, linagliptin, dapagliflozin -Follow-up with pharmacy in 1 month

## 2019-03-18 NOTE — Progress Notes (Signed)
    SUBJECTIVE:   CHIEF COMPLAINT / HPI:   DMT2 gradually improving . Symptoms: none  . Dapagliflozin Wilder Glade), Lantus : 78, Litagliptan . Statin therapy: Currently not taking crestor  . BP Goal: < 140/ < 90. At goal? Yes  . lisinopril (Prinivil)  10 mg HgA1C Trend:  Lab Results  Component Value Date   HGBA1C 13.6 (H) 02/18/2019   HGBA1C 9.3 (A) 09/04/2017   HGBA1C 13.3 (H) 04/29/2017   Renal Function Labs:  Lab Results  Component Value Date   MICROALBUR 3.1 (H) 03/30/2014   LDLCALC 58 03/01/2019   CREATININE 1.07 (H) 03/01/2019   The ASCVD Risk score Mikey Bussing DC Jr., et al., 2013) failed to calculate for the following reasons:   The patient has a prior MI or stroke diagnosis Diabetes Health Maintenance Due  Topic Date Due  . OPHTHALMOLOGY EXAM  07/07/2019  . HEMOGLOBIN A1C  08/18/2019  . FOOT EXAM  03/17/2020    Fasting CBGs in the a.m. are in the 200s.  Health maintenance: Needs flu and Tdap  PERTINENT  PMH / PSH:   OBJECTIVE:   BP 120/64   Pulse 88   Ht 5\' 4"  (1.626 m)   Wt 167 lb (75.8 kg)   SpO2 99%   BMI 28.67 kg/m   Gen: NAD, resting comfortably CV: RRR with no murmurs appreciated Pulm: NWOB, CTAB with no crackles, wheezes, or rhonchi GI: Normal bowel sounds present. Soft, Nontender, Nondistended. MSK: no edema, cyanosis, or clubbing noted Skin: warm, dry Neuro: grossly normal, moves all extremities Psych: Normal affect and thought content   Diabetic Foot Exam - Simple   Simple Foot Form Diabetic Foot exam was performed with the following findings: Yes 03/18/2019 11:13 AM  Visual Inspection No deformities, no ulcerations, no other skin breakdown bilaterally: Yes Sensation Testing Intact to touch and monofilament testing bilaterally: Yes Pulse Check Posterior Tibialis and Dorsalis pulse intact bilaterally: Yes Comments      ASSESSMENT/PLAN:   Hyperlipidemia associated with type 2 diabetes mellitus ([place]) Patient restart Crestor 40  mg  Uncontrolled type 2 diabetes mellitus with hyperglycemia ([place]) Improved since recent COVID-19 infection where glucose corticoids increased insulin needs -Restart Metformin 1000 mg twice daily -Continue home Lantus, linagliptin, dapagliflozin -Follow-up with pharmacy in 1 month     Bonnita Hollow, MD [place]

## 2019-03-18 NOTE — Assessment & Plan Note (Signed)
Patient restart Crestor 40 mg

## 2019-03-21 ENCOUNTER — Other Ambulatory Visit: Payer: Self-pay | Admitting: Family Medicine

## 2019-03-21 MED ORDER — METFORMIN HCL 1000 MG PO TABS: 1000.0000 mg | ORAL_TABLET | Freq: Two times a day (BID) | ORAL | 3 refills | Status: DC

## 2019-03-21 NOTE — Progress Notes (Signed)
Refilled metformin

## 2019-03-22 NOTE — Telephone Encounter (Signed)
Order changed to [place]. Christen Bame, CMA

## 2019-03-30 ENCOUNTER — Other Ambulatory Visit: Payer: Self-pay

## 2019-03-30 ENCOUNTER — Ambulatory Visit (INDEPENDENT_AMBULATORY_CARE_PROVIDER_SITE_OTHER): Payer: Medicaid Other | Admitting: Obstetrics & Gynecology

## 2019-03-30 ENCOUNTER — Other Ambulatory Visit (HOSPITAL_COMMUNITY)
Admission: RE | Admit: 2019-03-30 | Discharge: 2019-03-30 | Disposition: A | Discharge status: Home / Self Care | Payer: Medicaid Other | Source: Ambulatory Visit | Attending: Obstetrics & Gynecology | Admitting: Obstetrics & Gynecology

## 2019-03-30 ENCOUNTER — Encounter: Payer: Self-pay | Admitting: Obstetrics & Gynecology

## 2019-03-30 VITALS — BP 144/75 | HR 83 | Wt 164.0 lb

## 2019-03-30 DIAGNOSIS — B379 Candidiasis, unspecified: Secondary | ICD-10-CM | POA: Insufficient documentation

## 2019-03-30 NOTE — Progress Notes (Signed)
  HPI Pt presents today to have pessary removed and cleaned.  She denies problems and reports that her pessary is working very well.  She denies further complaints.    Review of Systems- pt got flu shot 1-2 weeks prev. Does not want to get COVID vaccine.        Objective:   Physical Exam Pt in NAD GYN: Pessary removed and cleaned; vaginal mucosa looks healty and with no excoriations or breakdown. There is some erythema and white discharge on the labia.      Assessment:     Pessary check  Vaginal discharge.      Plan:   f/u Affirm testing for yeast   F/u in 3 months to have pessary removed and cleaned F/u sooner prn   Emanuele Mcwhirter L. Harraway-Smith, M.D., Cherlynn June

## 2019-04-01 LAB — CERVICOVAGINAL ANCILLARY ONLY
Candida Glabrata: POSITIVE — AB
Candida Vaginitis: POSITIVE — AB
Comment: NEGATIVE
Comment: NEGATIVE

## 2019-04-18 ENCOUNTER — Ambulatory Visit: Payer: Medicaid Other | Admitting: Family Medicine

## 2019-04-20 ENCOUNTER — Other Ambulatory Visit: Payer: Self-pay | Admitting: Family Medicine

## 2019-04-21 ENCOUNTER — Other Ambulatory Visit: Payer: Self-pay

## 2019-04-21 ENCOUNTER — Other Ambulatory Visit: Payer: Self-pay | Admitting: Family Medicine

## 2019-04-21 ENCOUNTER — Ambulatory Visit (INDEPENDENT_AMBULATORY_CARE_PROVIDER_SITE_OTHER): Payer: Medicaid Other | Admitting: Pharmacist

## 2019-04-21 ENCOUNTER — Encounter: Payer: Self-pay | Admitting: Pharmacist

## 2019-04-21 VITALS — BP 138/66 | HR 89 | Ht 65.0 in | Wt 170.6 lb

## 2019-04-21 DIAGNOSIS — E1165 Type 2 diabetes mellitus with hyperglycemia: Secondary | ICD-10-CM

## 2019-04-21 MED ORDER — ACCU-CHEK GUIDE VI STRP: ORAL_STRIP | 11 refills | Status: DC

## 2019-04-21 MED ORDER — LIRAGLUTIDE 18 MG/3ML ~~LOC~~ SOPN: 0.6000 mg | PEN_INJECTOR | Freq: Every day | SUBCUTANEOUS | 3 refills | Status: DC

## 2019-04-21 NOTE — Patient Instructions (Addendum)
It was great seeing you today Ms. Holly Acosta!!  Today the plan is... 1. START liraglutide (Victoza) 0.6 mg subQ once daily 2. DECREASE Lantus to 30 units daily  3. STOP Tradjenta 4. STOP Novolog 5. Continue metformin 6. Pharmacy [facility] or Dr. Valentina Lucks) will call you in 1 week to see how your blood sugars are doing

## 2019-04-21 NOTE — Progress Notes (Signed)
S:     Chief Complaint  Patient presents with  . Medication Management    Diabetes    Patient arrives in good spirits, ambulating with assistance of a cane.  Presents for diabetes evaluation, education, and management.  She has been see many times in the Rx clinic however last visit was 03/2015.  Patient was referred on 03/18/2019 by Dr. Grandville Silos. Patient complains of arthralgia, dizziness, and blurred vision after initiation of Tradjenta.   Patient reports Diabetes was diagnosed in 2009.   Insurance coverage/medication affordability: [place] Medicaid  Patient reports adherence with medications.  Current diabetes medications include: metformin 1000 mg BID, linagliptin 5 mg daily, lantus (insulin glargine) 54 units daily **At prior appt with Dr. Grandville Silos patient was noted to be taking Lantus 38 units. She also reports not using Novolog since last appt with Dr. Grandville Silos since her BG readings are not >200 mg/dL. Current hypertension medications include: lisinopril 10 mg daily Current hyperlipidemia medications include: rosuvastatin 40mg  daily  Patient reports hypoglycemic events with mild symptoms and readings 80.  Patient reported dietary habits: Eats 3 meals/day Breakfast:Eggs, Toast, Grits, Coffee with creamer (denies juise) Lunch: soup with crackers Dinner: Chicken, green beans, corn Snacks: fruit, banana Drinks: coffee, diet ginger ale, diet fresca, unsweet tea with art. sweetener  Patient-reported exercise habits: limited due to leg pain   Patient denies nocturia (nighttime urination).  Patient reports neuropathy (nerve pain) intermittently. (follows with optometrist/opthalmologist) doctor annually)  Patient reports visual changes. Blurry vision daily Patient reports self foot exams. (does not follow with podiatrist)   Stopped Tanzeum (albaglutide) several years ago (she is unsure why she stopped - possibly could have stopped due to flu like symptoms)  O:  Physical  Exam Constitutional:      Appearance: Normal appearance. She is obese.  Psychiatric:        Mood and Affect: Mood normal.        Behavior: Behavior normal.        Thought Content: Thought content normal.        Judgment: Judgment normal.      Review of Systems  Eyes: Positive for blurred vision.  Musculoskeletal: Positive for joint pain.  Neurological: Positive for dizziness.     Lab Results  Component Value Date   HGBA1C 13.6 (H) 02/18/2019   There were no vitals filed for this visit.  Lipid Panel     Component Value Date/Time   CHOL 144 03/01/2019 1742   TRIG 303 (H) 03/01/2019 1742   HDL 39 (L) 03/01/2019 1742   CHOLHDL 3.7 03/01/2019 1742   CHOLHDL 5.1 04/29/2017 0557   VLDL 32 04/29/2017 0557   LDLCALC 58 03/01/2019 1742   LDLDIRECT 135 (H) 12/31/2012 0949    Home fasting blood sugars:  2x daily low 100s   2 hour post-meal/random blood sugars: 100s to 200s.   Clinical Atherosclerotic Cardiovascular Disease (ASCVD): Yes    A/P: Diabetes longstanding for > 10 years. Recent A1c readings have been elevated (~13) for a few years. However, after prior appt with Dr. Grandville Silos, patient's BG readings have significantly improved (likely due to medication adjustments - patient start [place] and increased Lantus (per Dr. Biagio Borg last note she was taking Lantus 38 units however patient reported at todays visit she was administering Lantus 54 units daily). Patient reports adherence with medications. -DECREASE basal insulin Lantus (insulin glargine) from 54 units subQ daily to 30 units subQ daily. Instructed patient to contact clinic if she experiences hypoglycemia. -STOPPED  rapid insulin aspart (insulin Novolog)   -STARTED GLP-1 Victoza (generic name liraglutide) 0.6 mg subQ daily. Sent in new rx to preferred pharmacy. -Continued SGLT2-I Farxiga (generic name dapagliflozin) 10 mg daily.  -STOPPED Tradjenta (linagliptin). Explained that blurred vision may be related to  quick decrease in BG readings and as her body adjusts to new DM medication regiment this should resolve. -Patient states she is out of test strips. Sent in new rx to preferred pharmacy. -Extensively discussed pathophysiology of diabetes, recommended lifestyle interventions, dietary effects on blood sugar control -Counseled on s/sx of and management of hypoglycemia -Next A1C anticipated 05/19/2019.   ASCVD risk - secondary prevention in patient with diabetes. Last LDL is controlled. High intensity statin indicated. Aspirin is indicated.  -Continued aspirin 81 mg  -Continued rosuvastatin 40 mg.   Hypertension longstanding currently mildly elevated, however, patient reports she monitors BP at home and it is controlled.  Blood pressure goal = <130/80 mmHg. Patient reports medication adherence.  -Continue lisinopril 10 mg daily and amlodipine 5 mg daily.  Written patient instructions provided.  Total time in face to face counseling 35 minutes.   Follow up Pharmacist via phone in 1 week.   Patient seen with Dr. Nicholas Lose and Drexel Iha, PharmD,  PGY2 Pharmacy Resident.

## 2019-04-25 NOTE — Assessment & Plan Note (Signed)
>>  ASSESSMENT AND PLAN FOR UNCONTROLLED TYPE 2 DIABETES MELLITUS WITH HYPERGLYCEMIA (HCC) WRITTEN ON 04/25/2019  8:13 AM BY KOVAL, PETER G, RPH-CPP  Diabetes longstanding for > 10 years. Recent A1c readings have been elevated (~13) for a few years. However, after prior appt with Dr. Janee Morn, patient's BG readings have significantly improved (likely due to medication adjustments - patient start Tradjenta and increased Lantus (per Dr. Carollee Massed last note she was taking Lantus 38 units however patient reported at todays visit she was administering Lantus 54 units daily). Patient reports adherence with medications. -DECREASE basal insulin Lantus (insulin glargine) from 54 units subQ daily to 30 units subQ daily. Instructed patient to contact clinic if she experiences hypoglycemia. -STOPPED  rapid insulin aspart (insulin Novolog)   -STARTED GLP-1 Victoza (generic name liraglutide) 0.6 mg subQ daily. Sent in new rx to preferred pharmacy. -Continued SGLT2-I Farxiga (generic name dapagliflozin) 10 mg daily.  -STOPPED Tradjenta (linagliptin). Explained that blurred vision may be related to quick decrease in BG readings and as her body adjusts to new DM medication regiment this should resolve. -Patient states she is out of test strips. Sent in new rx to preferred pharmacy. -Extensively discussed pathophysiology of diabetes, recommended lifestyle interventions, dietary effects on blood sugar control -Counseled on s/sx of and management of hypoglycemia -Next A1C anticipated 05/19/2019.

## 2019-04-25 NOTE — Progress Notes (Signed)
Reviewed: I agree with Dr. Koval's documentation and management. 

## 2019-04-25 NOTE — Assessment & Plan Note (Signed)
Diabetes longstanding for > 10 years. Recent A1c readings have been elevated (~13) for a few years. However, after prior appt with Dr. Grandville Silos, patient's BG readings have significantly improved (likely due to medication adjustments - patient start [place] and increased Lantus (per Dr. Biagio Borg last note she was taking Lantus 38 units however patient reported at todays visit she was administering Lantus 54 units daily). Patient reports adherence with medications. -DECREASE basal insulin Lantus (insulin glargine) from 54 units subQ daily to 30 units subQ daily. Instructed patient to contact clinic if she experiences hypoglycemia. -STOPPED  rapid insulin aspart (insulin Novolog)   -STARTED GLP-1 Victoza (generic name liraglutide) 0.6 mg subQ daily. Sent in new rx to preferred pharmacy. -Continued SGLT2-I Farxiga (generic name dapagliflozin) 10 mg daily.  -STOPPED Tradjenta (linagliptin). Explained that blurred vision may be related to quick decrease in BG readings and as her body adjusts to new DM medication regiment this should resolve. -Patient states she is out of test strips. Sent in new rx to preferred pharmacy. -Extensively discussed pathophysiology of diabetes, recommended lifestyle interventions, dietary effects on blood sugar control -Counseled on s/sx of and management of hypoglycemia -Next A1C anticipated 05/19/2019.

## 2019-04-28 ENCOUNTER — Other Ambulatory Visit: Payer: Self-pay | Admitting: Obstetrics & Gynecology

## 2019-04-28 MED ORDER — FLUCONAZOLE 150 MG PO TABS: 150.0000 mg | ORAL_TABLET | ORAL | 3 refills | Status: DC

## 2019-04-28 NOTE — Addendum Note (Signed)
Addended by: Lavonia Drafts on: 04/28/2019 12:55 PM   Modules accepted: Orders

## 2019-04-29 ENCOUNTER — Telehealth: Payer: Self-pay | Admitting: *Deleted

## 2019-04-29 NOTE — Telephone Encounter (Signed)
Pt is aware to come pick up scat form.  Placed at the front desk.  Copy made for batch scanning.  Christen Bame, CMA

## 2019-05-05 ENCOUNTER — Ambulatory Visit: Payer: Medicaid Other | Admitting: Cardiology

## 2019-05-19 NOTE — Progress Notes (Signed)
Cardiology Office Note   Date:  05/23/2019   ID:  Holly, Acosta 02-20-1955, MRN [id number]  PCP:  Daisy Floro, DO   Cardiologist: Dr. Martinique  Chief Complaint  Patient presents with  . Coronary Artery Disease     History of Present Illness: Holly Acosta is a 64 y.o. female who presents for ongoing assessment and management of coronary artery disease. She has a history of DM, HTN, HLD, and OSA. Cardiac cath in 2008 showed no significant CAD. She was admitted with a NSTEMI in April 2019.  Troponin > 65. Cardiac catheterization showed severe stenosis of the left circumflex requiring drug-eluting stent, currently on dual antiplatelet therapy with Brilinta and aspirin.  Her hospital course was complication by an acute ischemic left cerebral infarct without frank hemorrhagic transformation and acute ischemic nonhemorrhagic right cerebellar infarct. The patient was transferred to skilled nursing facility for rehab.    She was seen in the ER on 05/14/2017, It was suspected that constipation was causing symptoms. She was found to have a reducible ventral hernia without bowel incarceration. She was told to take Miralax daily.   She was admitted in January 2021 with Covid 19 PNA with hypoxic respiratory failure. Treated with decadron and remdesivir. As a result of this sugars have been poorly controlled with A1c 13.6%. She was tried on Victoza but couldn't tolerate it due to N/V.   On follow up today she reports she is doing well. Denies any chest pain or SOB. No palpitations. No TIA or CVA symptoms. Walks with a cane.   Past Medical History:  Diagnosis Date  . Acute cerebrovascular accident (CVA) of cerebellum ([place]) 04/29/2017  . Acute cerebrovascular accident (CVA) of cerebellum ([place]) 04/29/2017  . Acute respiratory failure with hypoxia ([place])   . AKI (acute kidney injury) ([place])   . Anemia   . Arthritis of knee    "both knees" (04/24/2017)  . Cerebrovascular accident (CVA) due to occlusion  of left posterior cerebral artery ([place]) 12/03/2017  . Chest pain 04/24/2017  . COLONIC POLYPS, HYPERPLASTIC 09/13/2009   Qualifier: Diagnosis of  By: Amil Amen MD, Benjamine Mola    . Constipation 06/02/2017  . Coronary artery disease involving native coronary artery of native heart with unstable angina pectoris ([place]) 04/30/2017  . Dehydration   . Diabetes mellitus, type 2 ([employer])   . History of blood transfusion    "related to prolapsed uterus"  . HLD (hyperlipidemia)   . HTN (hypertension)   . Non-ST elevation (NSTEMI) myocardial infarction ([place])   . Non-ST elevation (NSTEMI) myocardial infarction ([place])   . Pneumonia due to COVID-19 virus 02/18/2019  . Prolapsed uterus   . Severe obstructive sleep apnea-hypopnea syndrome 01/01/2018  . Sleep apnea    "have mask; haven't worn it for a long time" (04/24/2017)  . Status post coronary artery stent placement     Past Surgical History:  Procedure Laterality Date  . CARDIAC CATHETERIZATION  2011  . CORONARY STENT INTERVENTION N/A 04/27/2017   Procedure: CORONARY STENT INTERVENTION;  Surgeon: Jettie Booze, MD;  Location: [facility];  Service: Cardiovascular;  Laterality: N/A;  . LEFT HEART CATH AND CORONARY ANGIOGRAPHY N/A 04/27/2017   Procedure: LEFT HEART CATH AND CORONARY ANGIOGRAPHY;  Surgeon: Jettie Booze, MD;  Location: [facility];  Service: Cardiovascular;  Laterality: N/A;  . TUBAL LIGATION       Current Outpatient Medications  Medication Sig Dispense Refill  . ACCU-CHEK AVIVA PLUS test strip TEST  BLOOD SUGAR UP TO 3 TIMES A DAY 100 strip 6  . ACCU-CHEK FASTCLIX LANCETS MISC 1 each by Does not apply route daily. Check sugar daily as needed 102 each 3  . acetaminophen (TYLENOL) 500 MG tablet Take 1,000 mg by mouth every 12 (twelve) hours as needed for mild pain.     Marland Kitchen amLODipine (NORVASC) 5 MG tablet TAKE 1 TABLET BY MOUTH EVERY DAY 90 tablet 0  . aspirin EC 81 MG tablet Take 1 tablet (81 mg total) by mouth daily.  90 tablet 3  . Blood Glucose Monitoring Suppl (ACCU-CHEK NANO SMARTVIEW) w/Device KIT Use to test blood sugar up to 3 times a day. ICD-10 code: E11.9 1 kit 0  . BRILINTA 90 MG TABS tablet TAKE 1 TABLET BY MOUTH 2 TIMES DAILY. 60 tablet 2  . carvedilol (COREG) 12.5 MG tablet TAKE 1 TABLET BY MOUTH 2 TIMES DAILY WITH A MEAL. (Patient taking differently: Take 12.5 mg by mouth 2 (two) times daily with a meal. ) 180 tablet 3  . dapagliflozin propanediol (FARXIGA) 10 MG TABS tablet Take 10 mg by mouth daily. 90 tablet 3  . fluconazole (DIFLUCAN) 150 MG tablet Take 1 tablet (150 mg total) by mouth every 3 (three) days. For three doses 3 tablet 3  . glucose blood (ACCU-CHEK GUIDE) test strip TEST BLOOD SUGAR UP TO 3 TIMES A DAY 300 each 11  . glucose blood (ACCU-CHEK SMARTVIEW) test strip Use as instructed 100 each 12  . insulin aspart (NOVOLOG FLEXPEN) 100 UNIT/ML FlexPen Inject subcutaneously as directed per sliding scale. Sliding scale: 201-250: 2 units; 251-300: 4 units; 301-350: 6 units; 351-400: 8 units. 15 pen 2  . Insulin Glargine (LANTUS SOLOSTAR) 100 UNIT/ML Solostar Pen INJECT 32 units into skin dialy (Patient taking differently: Inject 54 Units into the skin daily before breakfast. ) 45 pen 3  . Insulin Pen Needle (B-D UF III MINI PEN NEEDLES) 31G X 5 MM MISC USE AS DIRECTED 3 TIMES A DAY 100 each 2  . lisinopril (ZESTRIL) 10 MG tablet Take 1 tablet (10 mg total) by mouth daily. 30 tablet 11  . metFORMIN (GLUCOPHAGE) 1000 MG tablet Take 1 tablet (1,000 mg total) by mouth 2 (two) times daily with a meal. 180 tablet 3  . rosuvastatin (CRESTOR) 40 MG tablet Take 1 tablet (40 mg total) by mouth daily. 90 tablet 3   No current facility-administered medications for this visit.    Allergies:   Lipitor [atorvastatin] and Diclofenac    Social History:  The patient  reports that she quit smoking about 12 years ago. Her smoking use included cigarettes. She started smoking about 46 years ago. She has a  33.00 pack-year smoking history. She has never used smokeless tobacco. She reports that she does not drink alcohol or use drugs.   Family History:  The patient's family history includes Diabetes in her mother; Hypertension in her father and mother.    ROS: All other systems are reviewed and negative. Unless otherwise mentioned in H&P    PHYSICAL EXAM: VS:  BP 121/73   Pulse 73   Ht '5\' 5"'$  (1.651 m)   Wt 168 lb 12.8 oz (76.6 kg)   SpO2 99%   BMI 28.09 kg/m  , BMI Body mass index is 28.09 kg/m. GEN: Well nourished, well developed, in no acute distress  HEENT: normal  Neck: no JVD, carotid bruits, or masses Cardiac: RRR; no murmurs, rubs, or gallops,no edema  Respiratory:  clear to auscultation bilaterally,  normal work of breathing GI: soft, nontender, nondistended, + BS MS: no deformity or atrophy  Skin: warm and dry, no rash Neuro:  Strength and sensation are intact Psych: euthymic mood, full affect   EKG:  Not completed during office visit   Recent Labs: 03/01/2019: ALT 21; BUN 18; Creatinine, Ser 1.07; Hemoglobin 11.8; Platelets 350; Potassium 4.3; Sodium 132  02/18/19: A1c 13.6%  Lipid Panel    Component Value Date/Time   CHOL 144 03/01/2019 1742   TRIG 303 (H) 03/01/2019 1742   HDL 39 (L) 03/01/2019 1742   CHOLHDL 3.7 03/01/2019 1742   CHOLHDL 5.1 04/29/2017 0557   VLDL 32 04/29/2017 0557   LDLCALC 58 03/01/2019 1742   LDLDIRECT 135 (H) 12/31/2012 0949      Wt Readings from Last 3 Encounters:  05/23/19 168 lb 12.8 oz (76.6 kg)  04/21/19 170 lb 9.6 oz (77.4 kg)  03/30/19 164 lb (74.4 kg)      Other studies Reviewed:  Cardiac Cath 04/27/2017     Prox RCA lesion is 25% stenosed.  Mid RCA to Dist RCA lesion is 25% stenosed.  Prox LAD lesion is 25% stenosed.  The left ventricular systolic function is normal.  LV end diastolic pressure is mildly elevated. LVEDP 18 mm Hg.  The left ventricular ejection fraction is 55-65% by visual estimate.  There is  no aortic valve stenosis.  Prox Cx lesion is 99% stenosed. This was the culprit lesion.  A drug-eluting stent was successfully placed using a STENT SIERRA 3.50 X 18 MM.  Post intervention, there is a 0% residual stenosis.   Continue dual antiplatelet therapy for at least one year with aggressive secondary prevention.       Echocardiogram 04/29/2017  Left ventricle: The cavity size was normal. There was moderate   concentric hypertrophy. Systolic function was normal. The   estimated ejection fraction was in the range of 55% to 60%.   Doppler parameters are consistent with abnormal left ventricular   relaxation (grade 1 diastolic dysfunction). There was no evidence   of elevated ventricular filling pressure by Doppler parameters. - Mitral valve: There was no regurgitation. Valve area by pressure   half-time: 2.08 cm^2. - Left atrium: The atrium was normal in size. - Right ventricle: Systolic function was normal. - Right atrium: The atrium was normal in size. - Tricuspid valve: There was no regurgitation. - Pericardium, extracardiac: The pericardium was normal in   appearance.  Impressions:  - There is hypokinesis of the basal inferior, inferolateral and mid   inferolateral walls, overall LVEF is preserved at 55-60%.   ASSESSMENT AND PLAN:  1.  CAD: S/P NSTEMI - cath requiring severe stenosis of the left circumflex requiring drug-eluting stent on 04/27/17, Continue ASA 81 mg daily. Stop Brilinta.   Continue Coreg and ACEi.   2. CVA: Bilateral cerebral non-hemmorhagic infarcts- embolic following cardiac cath/PCI. Improved.   3. Hypercholesterolemia: Continues on Crestor. LDL is at goal. High triglycerides reflect poor glycemic control.   4. Diabetes: poorly controlled. On metformin, Farxiga and insulin. Per primary care.   5. OSA. Reports she is using CPAP.  Follow up in one year  Jaylen Knope Martinique MD, [facility]     05/23/2019 4:27 PM    [place]

## 2019-05-23 ENCOUNTER — Other Ambulatory Visit: Payer: Self-pay

## 2019-05-23 ENCOUNTER — Ambulatory Visit (INDEPENDENT_AMBULATORY_CARE_PROVIDER_SITE_OTHER): Payer: Medicaid Other | Admitting: Cardiology

## 2019-05-23 ENCOUNTER — Encounter: Payer: Self-pay | Admitting: Cardiology

## 2019-05-23 VITALS — BP 121/73 | HR 73 | Ht 65.0 in | Wt 168.8 lb

## 2019-05-23 DIAGNOSIS — Z8673 Personal history of transient ischemic attack (TIA), and cerebral infarction without residual deficits: Secondary | ICD-10-CM | POA: Diagnosis not present

## 2019-05-23 DIAGNOSIS — E785 Hyperlipidemia, unspecified: Secondary | ICD-10-CM | POA: Diagnosis not present

## 2019-05-23 DIAGNOSIS — I2510 Atherosclerotic heart disease of native coronary artery without angina pectoris: Secondary | ICD-10-CM | POA: Diagnosis not present

## 2019-05-23 DIAGNOSIS — I10 Essential (primary) hypertension: Secondary | ICD-10-CM

## 2019-05-23 NOTE — Patient Instructions (Addendum)
Stop taking Brilinta  Continue your other therapy  Follow up in one year

## 2019-05-30 ENCOUNTER — Other Ambulatory Visit: Payer: Self-pay

## 2019-05-30 MED ORDER — ROSUVASTATIN CALCIUM 40 MG PO TABS: 40.0000 mg | ORAL_TABLET | Freq: Every day | ORAL | 3 refills | Status: DC

## 2019-07-08 ENCOUNTER — Other Ambulatory Visit: Payer: Self-pay | Admitting: *Deleted

## 2019-07-08 MED ORDER — BD PEN NEEDLE MINI U/F 31G X 5 MM MISC: 2 refills | Status: DC

## 2019-08-08 ENCOUNTER — Other Ambulatory Visit: Payer: Self-pay | Admitting: Cardiology

## 2019-08-11 ENCOUNTER — Ambulatory Visit: Payer: Medicaid Other | Admitting: Family Medicine

## 2019-09-08 ENCOUNTER — Telehealth: Payer: Self-pay

## 2019-09-08 NOTE — Telephone Encounter (Signed)
Received phone call from patient regarding removal of oxygen tanks from home. Patient reports that she is no longer needing home oxygen and would like them picked up. Called [facility] and per representative, we will need a discontinue order faxed to [phone].   To PCP  Talbot Grumbling, RN

## 2019-09-13 ENCOUNTER — Other Ambulatory Visit: Payer: Self-pay | Admitting: Family Medicine

## 2019-09-13 DIAGNOSIS — J1282 Pneumonia due to coronavirus disease 2019: Secondary | ICD-10-CM

## 2019-09-13 NOTE — Telephone Encounter (Signed)
Faxed to provided number.   Talbot Grumbling, RN

## 2019-09-14 ENCOUNTER — Encounter: Payer: Self-pay | Admitting: Family Medicine

## 2019-10-26 ENCOUNTER — Other Ambulatory Visit: Payer: Self-pay

## 2019-10-26 ENCOUNTER — Encounter: Payer: Self-pay | Admitting: Family Medicine

## 2019-10-26 ENCOUNTER — Ambulatory Visit: Payer: Medicaid Other | Admitting: Family Medicine

## 2019-10-26 VITALS — BP 121/72 | HR 77 | Ht 65.0 in | Wt 173.0 lb

## 2019-10-26 DIAGNOSIS — G4733 Obstructive sleep apnea (adult) (pediatric): Secondary | ICD-10-CM | POA: Diagnosis not present

## 2019-10-26 DIAGNOSIS — G4719 Other hypersomnia: Secondary | ICD-10-CM

## 2019-10-26 NOTE — Patient Instructions (Signed)
Please continue using your BiPAP regularly. While your insurance requires that you use BiPAP at least 4 hours each night on 70% of the nights, I recommend, that you not skip any nights and use it throughout the night if you can. Getting used to BiPAP and staying with the treatment long term does take time and patience and discipline. Untreated obstructive sleep apnea when it is moderate to severe can have an adverse impact on cardiovascular health and raise her risk for heart disease, arrhythmias, hypertension, congestive heart failure, stroke and diabetes. Untreated obstructive sleep apnea causes sleep disruption, nonrestorative sleep, and sleep deprivation. This can have an impact on your day to day functioning and cause daytime sleepiness and impairment of cognitive function, memory loss, mood disturbance, and problems focussing. Using BiPAP regularly can improve these symptoms.   Follow up with me in 3-4 months   Sleep Apnea Sleep apnea affects breathing during sleep. It causes breathing to stop for a short time or to become shallow. It can also increase the risk of:  Heart attack.  Stroke.  Being very overweight (obese).  Diabetes.  Heart failure.  Irregular heartbeat. The goal of treatment is to help you breathe normally again. What are the causes? There are three kinds of sleep apnea:  Obstructive sleep apnea. This is caused by a blocked or collapsed airway.  Central sleep apnea. This happens when the brain does not send the right signals to the muscles that control breathing.  Mixed sleep apnea. This is a combination of obstructive and central sleep apnea. The most common cause of this condition is a collapsed or blocked airway. This can happen if:  Your throat muscles are too relaxed.  Your tongue and tonsils are too large.  You are overweight.  Your airway is too small. What increases the risk?  Being overweight.  Smoking.  Having a small airway.  Being  older.  Being female.  Drinking alcohol.  Taking medicines to calm yourself (sedatives or tranquilizers).  Having family members with the condition. What are the signs or symptoms?  Trouble staying asleep.  Being sleepy or tired during the day.  Getting angry a lot.  Loud snoring.  Headaches in the morning.  Not being able to focus your mind (concentrate).  Forgetting things.  Less interest in sex.  Mood swings.  Personality changes.  Feelings of sadness (depression).  Waking up a lot during the night to pee (urinate).  Dry mouth.  Sore throat. How is this diagnosed?  Your medical history.  A physical exam.  A test that is done when you are sleeping (sleep study). The test is most often done in a sleep lab but may also be done at home. How is this treated?   Sleeping on your side.  Using a medicine to get rid of mucus in your nose (decongestant).  Avoiding the use of alcohol, medicines to help you relax, or certain pain medicines (narcotics).  Losing weight, if needed.  Changing your diet.  Not smoking.  Using a machine to open your airway while you sleep, such as: ? An oral appliance. This is a mouthpiece that shifts your lower jaw forward. ? A CPAP device. This device blows air through a mask when you breathe out (exhale). ? An EPAP device. This has valves that you put in each nostril. ? A BPAP device. This device blows air through a mask when you breathe in (inhale) and breathe out.  Having surgery if other treatments do not work.  It is important to get treatment for sleep apnea. Without treatment, it can lead to:  High blood pressure.  Coronary artery disease.  In men, not being able to have an erection (impotence).  Reduced thinking ability. Follow these instructions at home: Lifestyle  Make changes that your doctor recommends.  Eat a healthy diet.  Lose weight if needed.  Avoid alcohol, medicines to help you relax, and some pain  medicines.  Do not use any products that contain nicotine or tobacco, such as cigarettes, e-cigarettes, and chewing tobacco. If you need help quitting, ask your doctor. General instructions  Take over-the-counter and prescription medicines only as told by your doctor.  If you were given a machine to use while you sleep, use it only as told by your doctor.  If you are having surgery, make sure to tell your doctor you have sleep apnea. You may need to bring your device with you.  Keep all follow-up visits as told by your doctor. This is important. Contact a doctor if:  The machine that you were given to use during sleep bothers you or does not seem to be working.  You do not get better.  You get worse. Get help right away if:  Your chest hurts.  You have trouble breathing in enough air.  You have an uncomfortable feeling in your back, arms, or stomach.  You have trouble talking.  One side of your body feels weak.  A part of your face is hanging down. These symptoms may be an emergency. Do not wait to see if the symptoms will go away. Get medical help right away. Call your local emergency services (911 in the [place] Do not drive yourself to the hospital. Summary  This condition affects breathing during sleep.  The most common cause is a collapsed or blocked airway.  The goal of treatment is to help you breathe normally while you sleep. This information is not intended to replace advice given to you by your health care provider. Make sure you discuss any questions you have with your health care provider. Document Revised: 10/23/2017 Document Reviewed: 09/01/2017 Elsevier Patient Education  [place].

## 2019-10-26 NOTE — Progress Notes (Signed)
PATIENT: Holly Acosta DOB: 28-Nov-1955  REASON FOR VISIT: follow up HISTORY FROM: patient  Chief Complaint  Patient presents with  . Follow-up    new rm  . Sleep Apnea    Pt has questions about the cpap recall.      HISTORY OF PRESENT ILLNESS: Today 10/26/19 Holly Acosta is a 64 y.o. female here today for follow up for OSA on BiPAP. She was admitted to the hospital 01/2019 for PNA in setting of Covid.  She reports that she is recovered nicely.  She continues to follow-up very closely with primary care.  Diabetes has not been well controlled recently but she is working closely with her providers.  She has not used BiPAP consistently.  She has been concerned about the recall.  She thinks that someone told her her machine was not effective.  She does not use an external cleanser.  She does not expose BiPAP machine to excessive heat.  She is motivated to continue using BiPAP as she feels much less tired when she is using it consistently.  Compliance report dated 09/12/2019 through 10/11/2019 reveals that she used BiPAP therapy six of the past 30 days for compliance of 20%.  She used CPAP greater than 4 hours three of the past 30 days for compliance of 10%.  Residual AHI was 2.9 with a EPAP of 18 cm of water and IPAP of 21 cm of water.  There was no leak noted.   HISTORY: (copied from my note on 02/10/2019)  Holly negativeMary H Acosta is a 64 y.o. female here today for follow up for OSA on BiPAP.  Four hour usage remains sub optimal. She continues to work on meeting 4 hour goal. She admits that sometimes she falls asleep without placing BiPAP and sometimes she pulls her mask off during the night. Otherwise, she is doing very well. She notes benefit of BiPAP therapy.   Compliance report dated 01/10/2019 through 02/08/2019 reveals that she used BiPAP 28 of the last 30 days compliance of 93%.  She used BiPAP greater than 4 hours 57% of the Acosta.  Average usage was 3 hours and 59 minutes.  Residual  AHI was 5.4 with IPAP of 21 cm of water and EPAP of 18 cm of water. No significant leak noted.   HISTORY: (copied from my note on 08/09/2018)  Holly Orrison Turneris a 65 y.o.femalehere today for follow up of OSA on BiPAP.She is doing well with therapy. She is using her machine every night. She reports that she continues to struggle with using it for more than 4 hours each night. She does feel that this is improving with Acosta. She denies any concerns with mask. Compliance download dated 07/06/2018 through 08/04/2018 reveals that she is using her machine 29 of the last 30 days. On average she used her machine 3 hours and 34 minutes. AHI was 6.1 with EPAP of 18 cm of water and IPAP of 21 cm of water. There was no significant leak. She does note significant improvement in her fatigue.  History (copied from my note on 04/29/2018)  Holly Cruel Turneris a 64 y.o.femalefor follow up of OSA onBiPAP.She she reports that she is doing well with her BiPAP machine. She definitely feels that it helps with sleep and her energy the next day. Download report reveals that she is used her machine 27 out of the last 30 days for compliance of 90%. 63% compliance with use greater than 4 hours. She averages about  4 hours and 53 minutes. AHI is 8.1 with EPAP at 18 cm of water and IPAP at 21 cm of water.  History (copied from Dr Dohmeier's note on 12/03/2017)  Holly H Turneris a 64 y.o.femalepatientandseen on 12-03-2017 upon referral for a sleep evaluation. Holly Acosta is a [facility] patient with the resident clinic, she was admitted to [facility] on 27 April 2017 for a cardiac infarct status post stent placement and she has been using aspirin and Brilinta. 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 Half function at the Acosta was an ejection fraction of 55 to 60% with multi regional hypokinesis. The patient has known diabetes which has been poorly controlled at the Acosta her HbA1c was 13.3. Additional risk factors for stroke and heart disease are hyperlipidemia, obesity and untreated obstructive sleep apnea. The patient was seen by Dr. Cari Caraway and then by Dr. Leonie Man on 06 October 2017 HbA1c was now 9.3, still very elevated but improved. She has a little bit of bruising and bleeding from Brilinta in combination with aspirin. She is using a walker rehab had been delayed due to transportation problems. She takes Crestor and her blood pressure has been controlled.   Her previous sleep study9/20/ 2011 was ordered through Drs. Mulberry and MacPherson, it revealed severe OSA at an AHI of 48/h. CPAP therapy was recommnended.and sleep evaluations were obtained through the [facility].The machine is 64 years old and now broken. She can't remember who followed her in the [facility] sleep [facility] clinic. The study was read by Dr. Baird Lyons.    Chief complaint according to patient :" I may have to go back on CPAP - but I sleep fairly good" and " I easily fall asleep when I am inactive and not stimulated".  Sleep habits are as follows:Holly Acosta is around 7 Pm, and she goes to bed most nights between 10 and 11 PM. Has a seperate bedroom from her living room, and retreats at that Acosta. The bedroom is kept cool, quiet and dark. She sleeps on her back , some times on her left side. She needs 2 pillows and likes the head of bed elevated, sleeps with a fan in the background. She is not dreaming much, she stated. Reports only one bathroom break around 4-5 AM. Wakes sponatanously between 6 and 7 AM , and she estimates 7-8 hours of sleep each night.  She wakes up refreshed. No headaches in AM- but sometimes with a dry mouth.   She naps in daytime, unscheduled. She lives with 2 granddaughters, who reported her snoring loudly and witnessed her having sleep apnea.    Medical history: already in record, reviewed.   Family history : Sister is only family member with sleep apnea.   Social history:her 2 granddaughters live at her house , she is divorced. Disability due to knee osteoarthritis, she became disabled 2015 and has yet not received hip or knee surgery . She quit smoking over 20 years ago, but one granddaughter smokes in the house. ETOH- none, caffeine - she drinks coffee in AM , iced tea for lunch and dinner    REVIEW OF SYSTEMS: Out of a complete 14 system review of symptoms, the patient complains only of the following symptoms, fatigue, imbalance,  and all other reviewed systems are Acosta.  ESS: 17   ALLERGIES: Allergies  Allergen Reactions  . Lipitor [Atorvastatin] Other (See Comments)    Cramping in feet that resolved after stopping med  . Diclofenac Nausea Only and Other (See Comments)    Caused a lot of stomach pain     HOME MEDICATIONS: Outpatient Medications Prior to Visit  Medication Sig Dispense Refill  . ACCU-CHEK AVIVA PLUS test strip TEST BLOOD SUGAR UP TO 3 TIMES A DAY 100 strip 6  . ACCU-CHEK FASTCLIX LANCETS MISC 1 each by Does not apply route daily. Check sugar daily as needed 102 each 3  . acetaminophen (TYLENOL) 500 MG tablet Take 1,000 mg by mouth every 12 (twelve) hours as needed for mild pain.     Marland Kitchen amLODipine (NORVASC) 5 MG tablet TAKE 1 TABLET BY MOUTH EVERY DAY 90 tablet 3  . aspirin EC 81 MG tablet Take 1 tablet (81 mg total) by mouth daily. 90 tablet 3  . Blood Glucose Monitoring Suppl (ACCU-CHEK NANO SMARTVIEW) w/Device KIT Use to test blood sugar up to 3 times a day. ICD-10 code: E11.9 1 kit 0  . carvedilol (COREG) 12.5 MG tablet TAKE 1 TABLET BY MOUTH 2 TIMES DAILY WITH A MEAL. (Patient taking differently: Take 12.5 mg by mouth 2 (two) times daily with a  meal. ) 180 tablet 3  . dapagliflozin propanediol (FARXIGA) 10 MG TABS tablet Take 10 mg by mouth daily. 90 tablet 3  . glucose blood (ACCU-CHEK GUIDE) test strip TEST BLOOD SUGAR UP TO 3 TIMES A DAY 300 each 11  . glucose blood (ACCU-CHEK SMARTVIEW) test strip Use as instructed 100 each 12  . Insulin Glargine (LANTUS SOLOSTAR) 100 UNIT/ML Solostar Pen INJECT 32 units into skin dialy (Patient taking differently: Inject 54 Units into the skin daily before breakfast. ) 45 pen 3  . Insulin Pen Needle (B-D UF III MINI PEN NEEDLES) 31G X 5 MM MISC USE AS DIRECTED 3 TIMES A DAY 100 each 2  . lisinopril (ZESTRIL) 10 MG tablet Take 1 tablet (10 mg total) by mouth daily. 30 tablet 11  . metFORMIN (GLUCOPHAGE) 1000 MG tablet Take 1 tablet (1,000 mg total) by mouth 2 (two) times daily with a meal. 180 tablet 3  . rosuvastatin (CRESTOR) 40 MG tablet Take 1 tablet (40 mg total) by mouth daily. 90 tablet 3  . BRILINTA 90 MG TABS tablet TAKE 1 TABLET BY MOUTH 2 TIMES DAILY. 60 tablet 2  . fluconazole (DIFLUCAN) 150 MG tablet Take 1 tablet (150 mg total) by mouth every 3 (three) days. For three doses 3 tablet 3  . insulin aspart (NOVOLOG FLEXPEN) 100 UNIT/ML FlexPen Inject subcutaneously as directed per sliding scale. Sliding scale: 201-250: 2 units; 251-300: 4 units; 301-350: 6 units; 351-400: 8 units. 15 pen 2   No facility-administered medications prior to visit.    PAST MEDICAL HISTORY: Past Medical History:  Diagnosis Date  . Acute cerebrovascular accident (CVA) of cerebellum ([place]) 04/29/2017  . Acute cerebrovascular accident (CVA) of cerebellum ([place]) 04/29/2017  . Acute respiratory failure with hypoxia ([place])   . AKI (acute kidney injury) ([place])   . Anemia   . Arthritis of knee    "both knees" (04/24/2017)  . Cerebrovascular accident (CVA) due to occlusion of left posterior cerebral artery ([place]) 12/03/2017  . Chest pain 04/24/2017  . COLONIC POLYPS, HYPERPLASTIC 09/13/2009   Qualifier: Diagnosis of  By:  Amil Amen MD, Benjamine Mola    . Constipation 06/02/2017  .  Coronary artery disease involving native coronary artery of native heart with unstable angina pectoris ([place]) 04/30/2017  . Dehydration   . Diabetes mellitus, type 2 ([place])   . History of blood transfusion    "related to prolapsed uterus"  . HLD (hyperlipidemia)   . HTN (hypertension)   . Non-ST elevation (NSTEMI) myocardial infarction ([place])   . Non-ST elevation (NSTEMI) myocardial infarction ([place])   . Pneumonia due to COVID-19 virus 02/18/2019  . Prolapsed uterus   . Severe obstructive sleep apnea-hypopnea syndrome 01/01/2018  . Sleep apnea    "have mask; haven't worn it for a long Acosta" (04/24/2017)  . Status post coronary artery stent placement     PAST SURGICAL HISTORY: Past Surgical History:  Procedure Laterality Date  . CARDIAC CATHETERIZATION  2011  . CORONARY STENT INTERVENTION N/A 04/27/2017   Procedure: CORONARY STENT INTERVENTION;  Surgeon: Jettie Booze, MD;  Location: [facility];  Service: Cardiovascular;  Laterality: N/A;  . LEFT HEART CATH AND CORONARY ANGIOGRAPHY N/A 04/27/2017   Procedure: LEFT HEART CATH AND CORONARY ANGIOGRAPHY;  Surgeon: Jettie Booze, MD;  Location: [facility];  Service: Cardiovascular;  Laterality: N/A;  . TUBAL LIGATION      FAMILY HISTORY: Family History  Problem Relation Age of Onset  . Diabetes Mother   . Hypertension Mother   . Hypertension Father     SOCIAL HISTORY: Social History   Socioeconomic History  . Marital status: Divorced    Spouse name: Not on file  . Number of children: Not on file  . Years of education: Not on file  . Highest education level: Not on file  Occupational History  . Not on file  Tobacco Use  . Smoking status: Former Smoker    Packs/day: 1.00    Years: 33.00    Pack years: 33.00    Types: Cigarettes    Start date: 01/20/1973    Quit date: 11/04/2006    Years since quitting: 12.9  . Smokeless tobacco: Never Used  Vaping  Use  . Vaping Use: Never used  Substance and Sexual Activity  . Alcohol use: Never    Alcohol/week: 0.0 standard drinks  . Drug use: Never  . Sexual activity: Not Currently  Other Topics Concern  . Not on file  Social History Narrative   Lives with daughter [name] age 71) and 4 grandchildren.   Disability - Arthritis. Does get disability check.   Does not have a living will yet   Social Determinants of Health   Financial Resource Strain:   . Difficulty of Paying Living Expenses: Not on file  Food Insecurity:   . Worried About Charity fundraiser in the Last Year: Not on file  . Ran Out of Food in the Last Year: Not on file  Transportation Needs:   . Lack of Transportation (Medical): Not on file  . Lack of Transportation (Non-Medical): Not on file  Physical Activity:   . Days of Exercise per Week: Not on file  . Minutes of Exercise per Session: Not on file  Stress:   . Feeling of Stress : Not on file  Social Connections:   . Frequency of Communication with Friends and Family: Not on file  . Frequency of Social Gatherings with Friends and Family: Not on file  . Attends Religious Services: Not on file  . Active Member of Clubs or Organizations: Not on file  . Attends Archivist Meetings: Not on file  .  Marital Status: Not on file  Intimate Partner Violence:   . Fear of Current or Ex-Partner: Not on file  . Emotionally Abused: Not on file  . Physically Abused: Not on file  . Sexually Abused: Not on file      PHYSICAL EXAM  Vitals:   10/26/19 0837  BP: 121/72  Pulse: 77  Weight: 173 lb (78.5 kg)  Height: '5\' 5"'  (1.651 m)   Body mass index is 28.79 kg/m.  Generalized: Well developed, in no acute distress  Cardiology: normal rate and rhythm, no murmur noted Respiratory: clear to auscultation bilaterally  Neurological examination  Mentation: Alert oriented to Acosta, place, history taking. Follows all commands speech and language fluent Cranial nerve  II-XII: Pupils were equal round reactive to light. Extraocular movements were full, visual field were full  Motor: The motor testing reveals 5 over 5 strength of all 4 extremities. Good symmetric motor tone is noted throughout.  Gait and station: Gait is stable with cane   DIAGNOSTIC DATA (LABS, IMAGING, TESTING) - I reviewed patient records, labs, notes, testing and imaging myself where available.  No flowsheet data found.   Lab Results  Component Value Date   WBC 10.2 03/01/2019   HGB 11.8 03/01/2019   HCT 37.0 03/01/2019   MCV 87 03/01/2019   PLT 350 03/01/2019      Component Value Date/Acosta   NA 132 (L) 03/01/2019 1742   K 4.3 03/01/2019 1742   CL 97 03/01/2019 1742   CO2 23 03/01/2019 1742   GLUCOSE 413 (H) 03/01/2019 1742   GLUCOSE 286 (H) 02/19/2019 0500   BUN 18 03/01/2019 1742   CREATININE 1.07 (H) 03/01/2019 1742   CREATININE 0.93 02/01/2015 1640   CALCIUM 9.0 03/01/2019 1742   PROT 5.2 (L) 03/01/2019 1742   ALBUMIN 3.1 (L) 03/01/2019 1742   AST 12 03/01/2019 1742   ALT 21 03/01/2019 1742   ALKPHOS 162 (H) 03/01/2019 1742   BILITOT 0.2 03/01/2019 1742   GFRNONAA 55 (L) 03/01/2019 1742   GFRNONAA 67 02/01/2015 1640   GFRAA 64 03/01/2019 1742   GFRAA 78 02/01/2015 1640   Lab Results  Component Value Date   CHOL 144 03/01/2019   HDL 39 (L) 03/01/2019   LDLCALC 58 03/01/2019   LDLDIRECT 135 (H) 12/31/2012   TRIG 303 (H) 03/01/2019   CHOLHDL 3.7 03/01/2019   Lab Results  Component Value Date   HGBA1C 13.6 (H) 02/18/2019   Lab Results  Component Value Date   VITAMINB12 281 03/01/2019   Lab Results  Component Value Date   TSH 0.422 04/28/2017       ASSESSMENT AND PLAN 64 y.o. year old female  has a past medical history of Acute cerebrovascular accident (CVA) of cerebellum ([place]) (04/29/2017), Acute cerebrovascular accident (CVA) of cerebellum ([place]) (04/29/2017), Acute respiratory failure with hypoxia ([place]), AKI (acute kidney injury) ([place]), Anemia,  Arthritis of knee, Cerebrovascular accident (CVA) due to occlusion of left posterior cerebral artery ([place]) (12/03/2017), Chest pain (04/24/2017), COLONIC POLYPS, HYPERPLASTIC (09/13/2009), Constipation (06/02/2017), Coronary artery disease involving native coronary artery of native heart with unstable angina pectoris ([place]) (04/30/2017), Dehydration, Diabetes mellitus, type 2 ([place]), History of blood transfusion, HLD (hyperlipidemia), HTN (hypertension), Non-ST elevation (NSTEMI) myocardial infarction [facility]), Non-ST elevation (NSTEMI) myocardial infarction ([place]), Pneumonia due to COVID-19 virus (02/18/2019), Prolapsed uterus, Severe obstructive sleep apnea-hypopnea syndrome (01/01/2018), Sleep apnea, and Status post coronary artery stent placement. here with     ICD-10-CM   1. OSA treated with BiPAP  G47.33   2. Excessive daytime sleepiness  G47.19     Glora reports that she is doing well today.  Unfortunately, she has not recently used BiPAP due to fears of recall.  She does not use an external cleanser and there have been no excessive exposure to heat.  We have discussed details of recall.  I recommend that she resume BiPAP therapy at home.  She was encouraged to use BiPAP nightly and for greater than 4 hours each night.  I will have her return to see me in 3 to 4 months for a compliance review.  She will continue follow-up with primary care as directed.  Healthy lifestyle habits encouraged.   No orders of the defined types were placed in this encounter.    No orders of the defined types were placed in this encounter.     I spent 15 minutes with the patient. 50% of this Acosta was spent counseling and educating patient on plan of care and medications.    Debbora Presto, FNP-C 10/26/2019, 9:09 AM [facility] [address] [place], [place] [address] [phone]

## 2019-11-19 ENCOUNTER — Other Ambulatory Visit: Payer: Self-pay | Admitting: Family Medicine

## 2020-02-01 ENCOUNTER — Other Ambulatory Visit: Payer: Self-pay

## 2020-02-01 ENCOUNTER — Encounter: Payer: Self-pay | Admitting: Family Medicine

## 2020-02-01 ENCOUNTER — Ambulatory Visit: Payer: Medicaid Other | Admitting: Family Medicine

## 2020-02-01 VITALS — BP 148/86 | HR 75 | Ht 65.0 in | Wt 173.0 lb

## 2020-02-01 DIAGNOSIS — G4733 Obstructive sleep apnea (adult) (pediatric): Secondary | ICD-10-CM | POA: Diagnosis not present

## 2020-02-01 NOTE — Progress Notes (Signed)
PATIENT: Holly Acosta DOB: 27-Feb-1955  REASON FOR VISIT: follow up HISTORY FROM: patient  Chief Complaint  Patient presents with  . Follow-up    bipap fu, rm 2, pt doing well      HISTORY OF PRESENT ILLNESS: Today 02/01/20 She is doing well. She has resumed BiPAP therapy and is doing well. She denies difficulty with her machine or supplies. She has not heard anything regarding recall. She does note some air leaking from her mask but may need a replacement.   Compliance report dated 01/01/2020 through 01/30/2020 reveals that she used CPAP 29 of the past 30 days for compliance of 96.7%.  She used CPAP greater than 4 hours 66.7% of the time.  Average usage on days used was 4 hours and 21 minutes.  Residual AHI was 5.7 with a EPAP of 18 and IPAP of 21.  Average time in large leak per day was 1 hour and 39 minutes.   History (copied from previous notes) 10/26/2019 [name] is a 65 y.o. female here today for follow up for OSA on BiPAP. She was admitted to the hospital 01/2019 for PNA in setting of Covid.  She reports that she is recovered nicely.  She continues to follow-up very closely with primary care.  Diabetes has not been well controlled recently but she is working closely with her providers.  She has not used BiPAP consistently.  She has been concerned about the recall.  She thinks that someone told her her machine was not effective.  She does not use an external cleanser.  She does not expose BiPAP machine to excessive heat.  She is motivated to continue using BiPAP as she feels much less tired when she is using it consistently.  Compliance report dated 09/12/2019 through 10/11/2019 reveals that she used BiPAP therapy six of the past 30 days for compliance of 20%.  She used CPAP greater than 4 hours three of the past 30 days for compliance of 10%.  Residual AHI was 2.9 with a EPAP of 18 cm of water and IPAP of 21 cm of water.  There was no leak noted.  HISTORY: (copied from my note on  02/10/2019)  UA negativeMary H Vieyra is a 65 y.o. female here today for follow up for OSA on BiPAP.  Four hour usage remains sub optimal. She continues to work on meeting 4 hour goal. She admits that sometimes she falls asleep without placing BiPAP and sometimes she pulls her mask off during the night. Otherwise, she is doing very well. She notes benefit of BiPAP therapy.   Compliance report dated 01/10/2019 through 02/08/2019 reveals that she used BiPAP 28 of the last 30 days compliance of 93%.  She used BiPAP greater than 4 hours 57% of the time.  Average usage was 3 hours and 59 minutes.  Residual AHI was 5.4 with IPAP of 21 cm of water and EPAP of 18 cm of water. No significant leak noted.   HISTORY: (copied from my note on 08/09/2018)  [place] Turneris a 65 y.o.femalehere today for follow up of OSA on BiPAP.She is doing well with therapy. She is using her machine every night. She reports that she continues to struggle with using it for more than 4 hours each night. She does feel that this is improving with time. She denies any concerns with mask. Compliance download dated 07/06/2018 through 08/04/2018 reveals that she is using her machine 29 of the last 30 days. On average  she used her machine 3 hours and 34 minutes. AHI was 6.1 with EPAP of 18 cm of water and IPAP of 21 cm of water. There was no significant leak. She does note significant improvement in her fatigue.  History (copied from my note on 04/29/2018)  Chauncey Cruel Turneris a 65 y.o.femalefor follow up of OSA onBiPAP.She she reports that she is doing well with her BiPAP machine. She definitely feels that it helps with sleep and her energy the next day. Download report reveals that she is used her machine 27 out of the last 30 days for compliance of 90%. 63% compliance with use greater than 4 hours. She averages about 4 hours and 53 minutes. AHI is 8.1 with EPAP at 18 cm of water and IPAP at 21 cm of water.  History  (copied from Dr Dohmeier's note on 12/03/2017)  WUJ:WJXB H Turneris a 65 y.o.femalepatientandseen on 12-03-2017 upon referral for a sleep evaluation. Mrs. Voges is a [facility] patient with the resident clinic, she was admitted to [facility] on 27 April 2017 for a cardiac infarct status post stent placement and she has been using aspirin and Brilinta. 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 Half function at the time was an ejection fraction of 55 to 60% with multi regional hypokinesis. The patient has known diabetes which has been poorly controlled at the time her HbA1c was 13.3. Additional risk factors for stroke and heart disease are hyperlipidemia, obesity and untreated obstructive sleep apnea. The patient was seen by Dr. Cari Caraway and then by Dr. Leonie Man on 06 October 2017 HbA1c was now 9.3, still very elevated but improved. She has a little bit of bruising and bleeding from Brilinta in combination with aspirin. She is using a walker rehab had been delayed due to transportation problems. She takes Crestor and her blood pressure has been controlled.   Her previous sleep study9/20/ 2011 was ordered through Drs. Mulberry and MacPherson, it revealed severe OSA at an AHI of 48/h. CPAP therapy was recommnended.and sleep evaluations were obtained through the [facility].The machine is 65 years old and now broken. She can't remember who followed her in the [facility] sleep [facility] clinic. The study was read by Dr. Baird Lyons.    Chief complaint according to patient :" I may have to go back on CPAP - but I sleep fairly good" and " I easily fall asleep when I am inactive and not stimulated".  Sleep  habits are as follows:Donner time is around 7 Pm, and she goes to bed most nights between 10 and 11 PM. Has a seperate bedroom from her living room, and retreats at that time. The bedroom is kept cool, quiet and dark. She sleeps on her back , some times on her left side. She needs 2 pillows and likes the head of bed elevated, sleeps with a fan in the background. She is not dreaming much, she stated. Reports only one bathroom break around 4-5 AM. Wakes sponatanously between 6 and 7 AM , and she estimates 7-8 hours of sleep each night.  She wakes up refreshed. No headaches in AM- but sometimes with a dry mouth.  She naps in daytime, unscheduled. She lives with 2 granddaughters, who reported her snoring loudly and  witnessed her having sleep apnea.    Medical history: already in record, reviewed.   Family history : Sister is only family member with sleep apnea.   Social history:her 2 granddaughters live at her house , she is divorced. Disability due to knee osteoarthritis, she became disabled 2015 and has yet not received hip or knee surgery . She quit smoking over 20 years ago, but one granddaughter smokes in the house. ETOH- none, caffeine - she drinks coffee in AM , iced tea for lunch and dinner    REVIEW OF SYSTEMS: Out of a complete 14 system review of symptoms, the patient complains only of the following symptoms, fatigue, imbalance, and all other reviewed systems are negative.  ESS: 12   ALLERGIES: Allergies  Allergen Reactions  . Lipitor [Atorvastatin] Other (See Comments)    Cramping in feet that resolved after stopping med  . Diclofenac Nausea Only and Other (See Comments)    Caused a lot of stomach pain     HOME MEDICATIONS: Outpatient Medications Prior to Visit  Medication Sig Dispense Refill  . ACCU-CHEK AVIVA PLUS test strip TEST BLOOD SUGAR UP TO 3 TIMES A DAY 100 strip 6  . ACCU-CHEK FASTCLIX LANCETS MISC 1 each by Does not apply route daily. Check sugar daily as  needed 102 each 3  . acetaminophen (TYLENOL) 500 MG tablet Take 1,000 mg by mouth every 12 (twelve) hours as needed for mild pain.     Marland Kitchen amLODipine (NORVASC) 5 MG tablet TAKE 1 TABLET BY MOUTH EVERY DAY 90 tablet 3  . aspirin EC 81 MG tablet Take 1 tablet (81 mg total) by mouth daily. 90 tablet 3  . Blood Glucose Monitoring Suppl (ACCU-CHEK NANO SMARTVIEW) w/Device KIT Use to test blood sugar up to 3 times a day. ICD-10 code: E11.9 1 kit 0  . carvedilol (COREG) 12.5 MG tablet TAKE 1 TABLET BY MOUTH 2 TIMES DAILY WITH A MEAL. 180 tablet 3  . dapagliflozin propanediol (FARXIGA) 10 MG TABS tablet Take 10 mg by mouth daily. 90 tablet 3  . glucose blood (ACCU-CHEK GUIDE) test strip TEST BLOOD SUGAR UP TO 3 TIMES A DAY 300 each 11  . glucose blood (ACCU-CHEK SMARTVIEW) test strip Use as instructed 100 each 12  . Insulin Glargine (LANTUS SOLOSTAR) 100 UNIT/ML Solostar Pen INJECT 32 units into skin dialy (Patient taking differently: Inject 54 Units into the skin daily before breakfast.) 45 pen 3  . Insulin Pen Needle (B-D UF III MINI PEN NEEDLES) 31G X 5 MM MISC USE AS DIRECTED 3 TIMES A DAY 100 each 2  . lisinopril (ZESTRIL) 10 MG tablet Take 1 tablet (10 mg total) by mouth daily. 30 tablet 11  . metFORMIN (GLUCOPHAGE) 1000 MG tablet Take 1 tablet (1,000 mg total) by mouth 2 (two) times daily with a meal. 180 tablet 3  . rosuvastatin (CRESTOR) 40 MG tablet Take 1 tablet (40 mg total) by mouth daily. 90 tablet 3   No facility-administered medications prior to visit.    PAST MEDICAL HISTORY: Past Medical History:  Diagnosis Date  . Acute cerebrovascular accident (CVA) of cerebellum ([place]) 04/29/2017  . Acute cerebrovascular accident (CVA) of cerebellum ([place]) 04/29/2017  . Acute respiratory failure with hypoxia ([place])   . AKI (acute kidney injury) ([place])   . Anemia   . Arthritis of knee    "both knees" (04/24/2017)  . Cerebrovascular accident (CVA) due to occlusion of left posterior cerebral artery ([place])  12/03/2017  . Chest pain 04/24/2017  .  COLONIC POLYPS, HYPERPLASTIC 09/13/2009   Qualifier: Diagnosis of  By: Amil Amen MD, Benjamine Mola    . Constipation 06/02/2017  . Coronary artery disease involving native coronary artery of native heart with unstable angina pectoris ([place]) 04/30/2017  . Dehydration   . Diabetes mellitus, type 2 ([place])   . History of blood transfusion    "related to prolapsed uterus"  . HLD (hyperlipidemia)   . HTN (hypertension)   . Non-ST elevation (NSTEMI) myocardial infarction ([place])   . Non-ST elevation (NSTEMI) myocardial infarction ([place])   . Pneumonia due to COVID-19 virus 02/18/2019  . Prolapsed uterus   . Severe obstructive sleep apnea-hypopnea syndrome 01/01/2018  . Sleep apnea    "have mask; haven't worn it for a long time" (04/24/2017)  . Status post coronary artery stent placement     PAST SURGICAL HISTORY: Past Surgical History:  Procedure Laterality Date  . CARDIAC CATHETERIZATION  2011  . CORONARY STENT INTERVENTION N/A 04/27/2017   Procedure: CORONARY STENT INTERVENTION;  Surgeon: Jettie Booze, MD;  Location: [facility];  Service: Cardiovascular;  Laterality: N/A;  . LEFT HEART CATH AND CORONARY ANGIOGRAPHY N/A 04/27/2017   Procedure: LEFT HEART CATH AND CORONARY ANGIOGRAPHY;  Surgeon: Jettie Booze, MD;  Location: [facility];  Service: Cardiovascular;  Laterality: N/A;  . TUBAL LIGATION      FAMILY HISTORY: Family History  Problem Relation Age of Onset  . Diabetes Mother   . Hypertension Mother   . Hypertension Father     SOCIAL HISTORY: Social History   Socioeconomic History  . Marital status: Divorced    Spouse name: Not on file  . Number of children: Not on file  . Years of education: Not on file  . Highest education level: Not on file  Occupational History  . Not on file  Tobacco Use  . Smoking status: Former Smoker    Packs/day: 1.00    Years: 33.00    Pack years: 33.00    Types: Cigarettes    Start  date: 01/20/1973    Quit date: 11/04/2006    Years since quitting: 13.2  . Smokeless tobacco: Never Used  Vaping Use  . Vaping Use: Never used  Substance and Sexual Activity  . Alcohol use: Never    Alcohol/week: 0.0 standard drinks  . Drug use: Never  . Sexual activity: Not Currently  Other Topics Concern  . Not on file  Social History Narrative   Lives with daughter [name] age 22) and 4 grandchildren.   Disability - Arthritis. Does get disability check.   Does not have a living will yet   Social Determinants of Radio broadcast assistant Strain: Not on file  Food Insecurity: Not on file  Transportation Needs: Not on file  Physical Activity: Not on file  Stress: Not on file  Social Connections: Not on file  Intimate Partner Violence: Not on file      PHYSICAL EXAM  Vitals:   02/01/20 0854  BP: (!) 148/86  Pulse: 75  Weight: 173 lb (78.5 kg)  Height: $Remove'5\' 5"'vtsDnDK$  (1.651 m)   Body mass index is 28.79 kg/m.  Generalized: Well developed, in no acute distress  Cardiology: normal rate and rhythm, no murmur noted Respiratory: clear to auscultation bilaterally  Neurological examination  Mentation: Alert oriented to time, place, history taking. Follows all commands speech and language fluent Cranial nerve II-XII: Pupils were equal round reactive to light. Extraocular movements were full, visual field were full  Motor: The motor testing reveals 5 over 5 strength of all 4 extremities. Good symmetric motor tone is noted throughout.  Gait and station: Gait is stable with Rolator   DIAGNOSTIC DATA (LABS, IMAGING, TESTING) - I reviewed patient records, labs, notes, testing and imaging myself where available.  No flowsheet data found.   Lab Results  Component Value Date   WBC 10.2 03/01/2019   HGB 11.8 03/01/2019   HCT 37.0 03/01/2019   MCV 87 03/01/2019   PLT 350 03/01/2019      Component Value Date/Time   NA 132 (L) 03/01/2019 1742   K 4.3 03/01/2019 1742   CL 97  03/01/2019 1742   CO2 23 03/01/2019 1742   GLUCOSE 413 (H) 03/01/2019 1742   GLUCOSE 286 (H) 02/19/2019 0500   BUN 18 03/01/2019 1742   CREATININE 1.07 (H) 03/01/2019 1742   CREATININE 0.93 02/01/2015 1640   CALCIUM 9.0 03/01/2019 1742   PROT 5.2 (L) 03/01/2019 1742   ALBUMIN 3.1 (L) 03/01/2019 1742   AST 12 03/01/2019 1742   ALT 21 03/01/2019 1742   ALKPHOS 162 (H) 03/01/2019 1742   BILITOT 0.2 03/01/2019 1742   GFRNONAA 55 (L) 03/01/2019 1742   GFRNONAA 67 02/01/2015 1640   GFRAA 64 03/01/2019 1742   GFRAA 78 02/01/2015 1640   Lab Results  Component Value Date   CHOL 144 03/01/2019   HDL 39 (L) 03/01/2019   LDLCALC 58 03/01/2019   LDLDIRECT 135 (H) 12/31/2012   TRIG 303 (H) 03/01/2019   CHOLHDL 3.7 03/01/2019   Lab Results  Component Value Date   HGBA1C 13.6 (H) 02/18/2019   Lab Results  Component Value Date   VITAMINB12 281 03/01/2019   Lab Results  Component Value Date   TSH 0.422 04/28/2017       ASSESSMENT AND PLAN 65 y.o. year old female  has a past medical history of Acute cerebrovascular accident (CVA) of cerebellum ([place]) (04/29/2017), Acute cerebrovascular accident (CVA) of cerebellum ([place]) (04/29/2017), Acute respiratory failure with hypoxia ([place]), AKI (acute kidney injury) ([place]), Anemia, Arthritis of knee, Cerebrovascular accident (CVA) due to occlusion of left posterior cerebral artery ([place]) (12/03/2017), Chest pain (04/24/2017), COLONIC POLYPS, HYPERPLASTIC (09/13/2009), Constipation (06/02/2017), Coronary artery disease involving native coronary artery of native heart with unstable angina pectoris ([place]) (04/30/2017), Dehydration, Diabetes mellitus, type 2 ([place]), History of blood transfusion, HLD (hyperlipidemia), HTN (hypertension), Non-ST elevation (NSTEMI) myocardial infarction [facility]), Non-ST elevation (NSTEMI) myocardial infarction ([place]), Pneumonia due to COVID-19 virus (02/18/2019), Prolapsed uterus, Severe obstructive sleep apnea-hypopnea syndrome (01/01/2018),  Sleep apnea, and Status post coronary artery stent placement. here with     ICD-10-CM   1. OSA treated with BiPAP  G47.33 For home use only DME Bipap     Cashe reports that she is doing well today.  She has resumed BiPAP therapy and reports that she is feeling better.  She is less tired throughout the day.  Daily compliance is excellent, however, 4-hour compliance is suboptimal.  Residual AHI is 5.7.  She was encouraged to use BiPAP nightly and for greater than 4 hours each night.  I have advised her to reach out to her DME company for replacement mask.  She will monitor for concerns of continued leak.  I will reprint download in 4 to 6 weeks to review AHI and leak.  She will continue follow-up with primary care as directed.  Healthy lifestyle habits encouraged.  I will have her follow-up in 1 year, sooner if needed.  She verbalizes  understanding and agreement with this plan.   Orders Placed This Encounter  Procedures  . For home use only DME Bipap    Mask refitting and supplies please    Order Specific Question:   Length of Need    Answer:   Lifetime    Order Specific Question:   Inspiratory pressure    Answer:   OTHER SEE COMMENTS    Order Specific Question:   Expiratory pressure    Answer:   OTHER SEE COMMENTS     No orders of the defined types were placed in this encounter.     I spent 15 minutes with the patient. 50% of this time was spent counseling and educating patient on plan of care and medications.    Debbora Presto, FNP-C 02/01/2020, 9:45 AM [facility] [address] [place], [place] [address] [phone]

## 2020-02-01 NOTE — Progress Notes (Signed)
CMM sent for cpap orders

## 2020-02-01 NOTE — Patient Instructions (Addendum)
Please continue using your BiPAP regularly. While your insurance requires that you use BiPAP at least 4 hours each night on 70% of the nights, I recommend, that you not skip any nights and use it throughout the night if you can. Getting used to BiPAP and staying with the treatment long term does take time and patience and discipline. Untreated obstructive sleep apnea when it is moderate to severe can have an adverse impact on cardiovascular health and raise her risk for heart disease, arrhythmias, hypertension, congestive heart failure, stroke and diabetes. Untreated obstructive sleep apnea causes sleep disruption, nonrestorative sleep, and sleep deprivation. This can have an impact on your day to day functioning and cause daytime sleepiness and impairment of cognitive function, memory loss, mood disturbance, and problems focussing. Using BiPAP regularly can improve these symptoms.   Follow up in 1 year   Sleep Apnea Sleep apnea affects breathing during sleep. It causes breathing to stop for a short time or to become shallow. It can also increase the risk of:  Heart attack.  Stroke.  Being very overweight (obese).  Diabetes.  Heart failure.  Irregular heartbeat. The goal of treatment is to help you breathe normally again. What are the causes? There are three kinds of sleep apnea:  Obstructive sleep apnea. This is caused by a blocked or collapsed airway.  Central sleep apnea. This happens when the brain does not send the right signals to the muscles that control breathing.  Mixed sleep apnea. This is a combination of obstructive and central sleep apnea. The most common cause of this condition is a collapsed or blocked airway. This can happen if:  Your throat muscles are too relaxed.  Your tongue and tonsils are too large.  You are overweight.  Your airway is too small.   What increases the risk?  Being overweight.  Smoking.  Having a small airway.  Being older.  Being  female.  Drinking alcohol.  Taking medicines to calm yourself (sedatives or tranquilizers).  Having family members with the condition. What are the signs or symptoms?  Trouble staying asleep.  Being sleepy or tired during the day.  Getting angry a lot.  Loud snoring.  Headaches in the morning.  Not being able to focus your mind (concentrate).  Forgetting things.  Less interest in sex.  Mood swings.  Personality changes.  Feelings of sadness (depression).  Waking up a lot during the night to pee (urinate).  Dry mouth.  Sore throat. How is this diagnosed?  Your medical history.  A physical exam.  A test that is done when you are sleeping (sleep study). The test is most often done in a sleep lab but may also be done at home. How is this treated?  Sleeping on your side.  Using a medicine to get rid of mucus in your nose (decongestant).  Avoiding the use of alcohol, medicines to help you relax, or certain pain medicines (narcotics).  Losing weight, if needed.  Changing your diet.  Not smoking.  Using a machine to open your airway while you sleep, such as: ? An oral appliance. This is a mouthpiece that shifts your lower jaw forward. ? A CPAP device. This device blows air through a mask when you breathe out (exhale). ? An EPAP device. This has valves that you put in each nostril. ? A BPAP device. This device blows air through a mask when you breathe in (inhale) and breathe out.  Having surgery if other treatments do not work. It   is important to get treatment for sleep apnea. Without treatment, it can lead to:  High blood pressure.  Coronary artery disease.  In men, not being able to have an erection (impotence).  Reduced thinking ability.   Follow these instructions at home: Lifestyle  Make changes that your doctor recommends.  Eat a healthy diet.  Lose weight if needed.  Avoid alcohol, medicines to help you relax, and some pain  medicines.  Do not use any products that contain nicotine or tobacco, such as cigarettes, e-cigarettes, and chewing tobacco. If you need help quitting, ask your doctor. General instructions  Take over-the-counter and prescription medicines only as told by your doctor.  If you were given a machine to use while you sleep, use it only as told by your doctor.  If you are having surgery, make sure to tell your doctor you have sleep apnea. You may need to bring your device with you.  Keep all follow-up visits as told by your doctor. This is important. Contact a doctor if:  The machine that you were given to use during sleep bothers you or does not seem to be working.  You do not get better.  You get worse. Get help right away if:  Your chest hurts.  You have trouble breathing in enough air.  You have an uncomfortable feeling in your back, arms, or stomach.  You have trouble talking.  One side of your body feels weak.  A part of your face is hanging down. These symptoms may be an emergency. Do not wait to see if the symptoms will go away. Get medical help right away. Call your local emergency services (911 in the [place] Do not drive yourself to the hospital. Summary  This condition affects breathing during sleep.  The most common cause is a collapsed or blocked airway.  The goal of treatment is to help you breathe normally while you sleep. This information is not intended to replace advice given to you by your health care provider. Make sure you discuss any questions you have with your health care provider. Document Revised: 10/23/2017 Document Reviewed: 09/01/2017 Elsevier Patient Education  2021 [employer]  

## 2020-03-06 ENCOUNTER — Telehealth: Payer: Self-pay

## 2020-03-06 ENCOUNTER — Encounter: Payer: Self-pay | Admitting: Family Medicine

## 2020-03-06 ENCOUNTER — Ambulatory Visit (INDEPENDENT_AMBULATORY_CARE_PROVIDER_SITE_OTHER): Payer: Medicaid Other | Admitting: Family Medicine

## 2020-03-06 ENCOUNTER — Other Ambulatory Visit: Payer: Self-pay

## 2020-03-06 VITALS — BP 135/75 | HR 75 | Ht 65.0 in | Wt 174.4 lb

## 2020-03-06 DIAGNOSIS — E1169 Type 2 diabetes mellitus with other specified complication: Secondary | ICD-10-CM

## 2020-03-06 DIAGNOSIS — E1165 Type 2 diabetes mellitus with hyperglycemia: Secondary | ICD-10-CM

## 2020-03-06 DIAGNOSIS — Z23 Encounter for immunization: Secondary | ICD-10-CM | POA: Insufficient documentation

## 2020-03-06 DIAGNOSIS — I10 Essential (primary) hypertension: Secondary | ICD-10-CM | POA: Diagnosis not present

## 2020-03-06 DIAGNOSIS — Z1231 Encounter for screening mammogram for malignant neoplasm of breast: Secondary | ICD-10-CM | POA: Diagnosis not present

## 2020-03-06 DIAGNOSIS — E785 Hyperlipidemia, unspecified: Secondary | ICD-10-CM | POA: Diagnosis not present

## 2020-03-06 DIAGNOSIS — Z1211 Encounter for screening for malignant neoplasm of colon: Secondary | ICD-10-CM | POA: Insufficient documentation

## 2020-03-06 LAB — POCT GLYCOSYLATED HEMOGLOBIN (HGB A1C): Hemoglobin A1C: 13.8 % — AB (ref 4.0–5.6)

## 2020-03-06 MED ORDER — NOVOLOG FLEXPEN 100 UNIT/ML ~~LOC~~ SOPN: 10.0000 [IU] | PEN_INJECTOR | Freq: Three times a day (TID) | SUBCUTANEOUS | 11 refills | Status: DC

## 2020-03-06 MED ORDER — OZEMPIC (0.25 OR 0.5 MG/DOSE) 2 MG/1.5ML ~~LOC~~ SOPN: 0.5000 mg | PEN_INJECTOR | SUBCUTANEOUS | 3 refills | Status: DC

## 2020-03-06 NOTE — Progress Notes (Signed)
    SUBJECTIVE:   CHIEF COMPLAINT / HPI:   T2DM: This is a pleasant patient presenting to clinic to follow-up for diabetes.  Patient is taking Metformin 1000 mg twice daily, Lantus 54 units daily, and Farxiga 10 mg daily.  Patient is also prescribed lisinopril 10 mg daily for renal protection.  Her last HbA1c was 13.6% in January 2021. Today her A1c is 13.8%.  The patient was last seen in April 2021 (by the pharmacy team) for her diabetes.  At this time the patient's linagliptin and short acting insulin were stopped.  The patient was started on liraglutide, however reports having nausea/vomiting with this medication.  The patient was continued on Farxiga and Metformin.  Hyperlipidemia: Patient's last lipid panel from February 2021 shows elevated triglycerides at 309, HDL low at 39, LDL elevated at 135.  Patient is prescribed Crestor 40 mg daily.   Hypertension: Today 2/15 patient's blood pressure is 137/75.  Patient is prescribed lisinopril 10 mg, carvedilol 12.5 mg twice daily, amlodipine 5 mg daily.  Health maintenance: Patient would benefit from COVID vaccine, flu vaccine, diabetic eye exam (saw on [place], [employer]), mammogram (due since September 2021), and colonoscopy.  PERTINENT  PMH / PSH:  HLD, OA, OSA on CPAP, CAD, BMI 29  OBJECTIVE:   BP 135/75   Pulse 75   Ht 5\' 5"  (1.651 m)   Wt 174 lb 6.4 oz (79.1 kg)   SpO2 98%   BMI 29.02 kg/m    Physical exam: General: Well-appearing patient, no apparent distress Respiratory: CTA bilaterally, comfortable work of breathing Cardio: RRR, S1-S2 present, no murmurs appreciated   ASSESSMENT/PLAN:   Uncontrolled type 2 diabetes mellitus with hyperglycemia (HCC) Diabetes remains unfortunately poorly controlled, repeat HbA1c today 13.8%, was 13.6% in January 2021. -Continue Lantus 45 units daily -Continue Farxiga 10 mg tablet once daily -Continue Metformin 1000 mg twice daily -Patient started on short acting insulin  NovoLog 10 units 3 times daily with meals, or 15 units when CBG greater than 250. -Patient to follow-up with pharmacy team in 1-2 weeks -Patient instructed to bring all of her medications to this appointment.  HYPERTENSION, BENIGN ESSENTIAL Blood pressure today well controlled at 135/75.  Patient prescribed amlodipine 5 mg, lisinopril 10 mg, and carvedilol 12.5 mg twice daily. -Continue amlodipine 5 mg, carvedilol 12.5 mg twice daily, and lisinopril 10 mg daily  Hyperlipidemia associated with type 2 diabetes mellitus ([place]) Lipid panel rechecked at today's appointment. -Patient to continue Crestor 40 mg daily.  Need for immunization against influenza -Patient received flu vaccine today 03/06/2020  Breast cancer screening by mammogram -Patient referred for breast cancer screening     Holly Acosta, [place]

## 2020-03-06 NOTE — Assessment & Plan Note (Signed)
>>  ASSESSMENT AND PLAN FOR UNCONTROLLED TYPE 2 DIABETES MELLITUS WITH HYPERGLYCEMIA (HCC) WRITTEN ON 03/06/2020  1:16 PM BY Peggyann Shoals C, DO  Diabetes remains unfortunately poorly controlled, repeat HbA1c today 13.8%, was 13.6% in January 2021. -Continue Lantus 45 units daily -Continue Farxiga 10 mg tablet once daily -Continue Metformin 1000 mg twice daily -Patient started on short acting insulin NovoLog 10 units 3 times daily with meals, or 15 units when CBG greater than 250. -Patient to follow-up with pharmacy team in 1-2 weeks -Patient instructed to bring all of her medications to this appointment.

## 2020-03-06 NOTE — Assessment & Plan Note (Signed)
Blood pressure today well controlled at 135/75.  Patient prescribed amlodipine 5 mg, lisinopril 10 mg, and carvedilol 12.5 mg twice daily. -Continue amlodipine 5 mg, carvedilol 12.5 mg twice daily, and lisinopril 10 mg daily

## 2020-03-06 NOTE — Telephone Encounter (Signed)
-----   Message from Daisy Floro, DO sent at 03/06/2020  1:13 PM EST ----- Regarding: Mammogram scheduling Hello! I'm so sorry I forgot all about this.  This patient is due for a mammogram, I placed an order at today's visit 03/06/2020, however I do not believe I gave her any information about scheduling it/getting it scheduled.  Would you have the time to get this scheduled for her?  Milus Banister, [place], PGY-3 03/06/2020 1:15 PM

## 2020-03-06 NOTE — Assessment & Plan Note (Signed)
Lipid panel rechecked at today's appointment. -Patient to continue Crestor 40 mg daily.

## 2020-03-06 NOTE — Assessment & Plan Note (Signed)
-  Patient received flu vaccine today 03/06/2020

## 2020-03-06 NOTE — Assessment & Plan Note (Addendum)
Diabetes remains unfortunately poorly controlled, repeat HbA1c today 13.8%, was 13.6% in January 2021. -Continue Lantus 45 units daily -Continue Farxiga 10 mg tablet once daily -Continue Metformin 1000 mg twice daily -Patient started on short acting insulin NovoLog 10 units 3 times daily with meals, or 15 units when CBG greater than 250. -Patient to follow-up with pharmacy team in 1-2 weeks -Patient instructed to bring all of her medications to this appointment.

## 2020-03-06 NOTE — Assessment & Plan Note (Signed)
>>  ASSESSMENT AND PLAN FOR HYPERTENSION, BENIGN ESSENTIAL WRITTEN ON 03/06/2020 10:03 AM BY Dareen Piano, HANNAH C, DO  Blood pressure today well controlled at 135/75.  Patient prescribed amlodipine 5 mg, lisinopril 10 mg, and carvedilol 12.5 mg twice daily. -Continue amlodipine 5 mg, carvedilol 12.5 mg twice daily, and lisinopril 10 mg daily

## 2020-03-06 NOTE — Assessment & Plan Note (Signed)
-  Patient referred for breast cancer screening

## 2020-03-06 NOTE — Progress Notes (Signed)
Asked by Dr. Ouida Sills to meet with patient and discuss options to improve glycemic control.   Patient reported vomiting with use of Liraglutide (Victoza) and is not interested in trying an alternative agent at this time.    She has been taking only basal insulin, reporting a once daily dose of insulin glargine of 45 units.   Denies hypoglycemia.  Reports majority of readings> 200 with a few readings slightly below 200.    Following discussion, patient willing to reinitiate mealtime Novolog.  Instructed patient use 10 units for pre-meal blood glucose 80-250 and 15 units if > 250mg /dl.    Patient willing to return to pharmacy clinic in two weeks for further assessment and therapy adjustment.  Following instruction patient verbalized understanding of treatment plan.

## 2020-03-06 NOTE — Patient Instructions (Addendum)
Thank you for coming in to see [place] today! Please see below to review our plan for today's visit:  1. Diabetes:   -Continue Lantus 45 units daily. Continue to check your blood sugar at least three daily. Write down your blood sugars in a notebook and bring them to your next appointment.   -Continue Farxiga 10mg  tablet once daily and Metformin 1000mg  twice daily  -We are starting back short-acting insulin called NovoLog. Take 10 units with each meal OR 15 if your blood sugar is greater than 250.  2. Follow up in 2 weeks with the pharmacy team for review if your diabetes meds. BRING ALL OF YOUR MEDS TO THIS APPOINTMENT. 3. We are checking your cholesterol levels today - continue your Crestor every day.  4. Your blood pressure was great! Continue your home meds.   Please call the clinic at [phone] if your symptoms worsen or you have any concerns. It was our pleasure to serve you!   Dr. Milus Banister [facility]

## 2020-03-06 NOTE — Telephone Encounter (Signed)
Called and spoke to patient concerning order having been placed for screening mammogram.  Patient can not remember where she had her last mammogram.    Patient's can schedule their own screening exams. It is [facility]'s protocol to schedule diagnostic exams.  Patient was given the number to [facility] [phone] to see if she had gotten one done there before or if she needed to start from baseline.  Patient verbalizes understanding.  Holly Acosta, [place]

## 2020-03-07 ENCOUNTER — Encounter: Payer: Self-pay | Admitting: Family Medicine

## 2020-03-07 LAB — BASIC METABOLIC PANEL
BUN/Creatinine Ratio: 14 (ref 12–28)
BUN: 15 mg/dL (ref 8–27)
CO2: 21 mmol/L (ref 20–29)
Calcium: 10 mg/dL (ref 8.7–10.3)
Chloride: 99 mmol/L (ref 96–106)
Creatinine, Ser: 1.11 mg/dL — ABNORMAL HIGH (ref 0.57–1.00)
GFR calc Af Amer: 61 mL/min/{1.73_m2} (ref >59)
GFR calc non Af Amer: 53 mL/min/{1.73_m2} — ABNORMAL LOW (ref >59)
Glucose: 241 mg/dL — ABNORMAL HIGH (ref 65–99)
Potassium: 4.5 mmol/L (ref 3.5–5.2)
Sodium: 136 mmol/L (ref 134–144)

## 2020-03-07 LAB — LIPID PANEL
Chol/HDL Ratio: 7.6 ratio — ABNORMAL HIGH (ref 0.0–4.4)
Cholesterol, Total: 266 mg/dL — ABNORMAL HIGH (ref 100–199)
HDL: 35 mg/dL — ABNORMAL LOW (ref >39)
LDL Chol Calc (NIH): 155 mg/dL — ABNORMAL HIGH (ref 0–99)
Triglycerides: 403 mg/dL — ABNORMAL HIGH (ref 0–149)
VLDL Cholesterol Cal: 76 mg/dL — ABNORMAL HIGH (ref 5–40)

## 2020-03-20 ENCOUNTER — Other Ambulatory Visit: Payer: Self-pay

## 2020-03-20 DIAGNOSIS — E1165 Type 2 diabetes mellitus with hyperglycemia: Secondary | ICD-10-CM

## 2020-03-20 MED ORDER — METFORMIN HCL 1000 MG PO TABS: 1000.0000 mg | ORAL_TABLET | Freq: Two times a day (BID) | ORAL | 3 refills | Status: DC

## 2020-03-20 MED ORDER — LISINOPRIL 10 MG PO TABS: 10.0000 mg | ORAL_TABLET | Freq: Every day | ORAL | 11 refills | Status: DC

## 2020-03-22 ENCOUNTER — Encounter: Payer: Self-pay | Admitting: Pharmacist

## 2020-03-22 ENCOUNTER — Ambulatory Visit (INDEPENDENT_AMBULATORY_CARE_PROVIDER_SITE_OTHER): Payer: Medicaid Other | Admitting: Pharmacist

## 2020-03-22 ENCOUNTER — Other Ambulatory Visit: Payer: Self-pay

## 2020-03-22 DIAGNOSIS — E1169 Type 2 diabetes mellitus with other specified complication: Secondary | ICD-10-CM

## 2020-03-22 DIAGNOSIS — E785 Hyperlipidemia, unspecified: Secondary | ICD-10-CM

## 2020-03-22 DIAGNOSIS — E1165 Type 2 diabetes mellitus with hyperglycemia: Secondary | ICD-10-CM

## 2020-03-22 MED ORDER — EZETIMIBE 10 MG PO TABS: 10.0000 mg | ORAL_TABLET | Freq: Every day | ORAL | 3 refills | Status: DC

## 2020-03-22 MED ORDER — NOVOLOG FLEXPEN 100 UNIT/ML ~~LOC~~ SOPN: 12.0000 [IU] | PEN_INJECTOR | Freq: Three times a day (TID) | SUBCUTANEOUS | Status: DC

## 2020-03-22 MED ORDER — LANTUS SOLOSTAR 100 UNIT/ML ~~LOC~~ SOPN: 40.0000 [IU] | PEN_INJECTOR | Freq: Every day | SUBCUTANEOUS | Status: DC

## 2020-03-22 NOTE — Assessment & Plan Note (Signed)
Diabetes longstanding > 10 years currrently much improved. Patient is able to verbalize appropriate hypoglycemia management plan. Medication adherence appears good since addition of prandial insulin. Control is much improved with readings in the high 100s. . -Decreased dose of basal insulin from 45 to 40 (insulin glargine).  -Increased dose of  rapid insulin from 10 to 12 with sliding scale for > 250 of 15 units  (insulin aspart).  -Continued SGLT2-I 10mg  (generic name dapagliflozin). -Extensively discussed pathophysiology of diabetes, recommended lifestyle interventions, dietary effects on blood sugar control -Counseled on s/sx of and management of hypoglycemia

## 2020-03-22 NOTE — Progress Notes (Signed)
REviewed: I agree with Dr. Graylin Shiver documentation and management.

## 2020-03-22 NOTE — Progress Notes (Signed)
S:     Chief Complaint  Patient presents with  . Medication Management    Diabetes    Patient arrives in good spirits, ambulating with assistance of rolling walker.  Presents for diabetes evaluation, education, and management following recent initiation of multi-shop prandial insulin regimen with Novolog.  Patient was referred and last seen by Primary Care Provider on 03/06/2020  Diabetes has been diagnosed since 2009.   Insurance coverage/medication affordability: Medicaid  Medication adherence reported - good since last visit, however, she did not take her blood pressure medication this AM.  Current diabetes medications include: Lantus 45 units QAM,  Novolog 10 units prior to meals (she noted that she has not needed to use 15 units prior to meals for more than 1 week as her pre-meal blood sugars have all been < 250. Current hypertension medications include: lisinopril, amlodipine, carvedilol Current hyperlipidemia medications include: Rosuvastatin 40mg   Patient denies hypoglycemic events.  Patient reported dietary habits: Eats 3 meals/day  Patient-reported exercise habits: limited as she is using "rolling walker" but states interest in doing more with change in weather improving.    Patient denies nocturia (nighttime urination).   O:  Physical Exam Pulmonary:     Effort: Pulmonary effort is normal.  Neurological:     Mental Status: She is alert.  Psychiatric:        Mood and Affect: Mood normal.        Behavior: Behavior normal.        Thought Content: Thought content normal.    Review of Systems  Genitourinary: Negative for frequency and urgency.  Musculoskeletal: Negative for myalgias.  All other systems reviewed and are negative.    Lab Results  Component Value Date   HGBA1C 13.8 (A) 03/06/2020   Vitals:   03/22/20 0913  BP: 136/68  Pulse: 75  SpO2: 97%    Lipid Panel     Component Value Date/Time   CHOL 266 (H) 03/06/2020 0915   TRIG 403 (H)  03/06/2020 0915   HDL 35 (L) 03/06/2020 0915   CHOLHDL 7.6 (H) 03/06/2020 0915   CHOLHDL 5.1 04/29/2017 0557   VLDL 32 04/29/2017 0557   LDLCALC 155 (H) 03/06/2020 0915   LDLDIRECT 135 (H) 12/31/2012 0949    Home fasting and pre-prandial blood sugars: 168-205   A/P: Diabetes longstanding > 10 years currrently much improved. Patient is able to verbalize appropriate hypoglycemia management plan. Medication adherence appears good since addition of prandial insulin. Control is much improved with readings in the high 100s. . -Decreased dose of basal insulin from 45 to 40 (insulin glargine).  -Increased dose of  rapid insulin from 10 to 12 with sliding scale for > 250 of 15 units  (insulin aspart).  -Continued SGLT2-I 10mg  (generic name dapagliflozin). -Extensively discussed pathophysiology of diabetes, recommended lifestyle interventions, dietary effects on blood sugar control -Counseled on s/sx of and management of hypoglycemia  ASCVD risk - secondary prevention in patient with diabetes. Last LDL ws 155 and not controlled. ASCVD history high intensity statin indicated. Aspirin is indicated.  -Continued aspirin 81 mg  -Continued rosuvastatin 40 mg.  - Start Ezetimibe (Zetia) 10mg  once daily   Written patient instructions provided.  Total time in face to face counseling 20 minutes.   Follow up Pharmacist  Clinic Visit in 3 weeks.   Patient seen with Inis Sizer, PharmD Candidate, and Shauna Hugh, PharmD - PGY-1 Resident, and Lorel Monaco, PharmD, BCPS - PGY2 Pharmacy Resident.

## 2020-03-22 NOTE — Assessment & Plan Note (Signed)
ASCVD risk - secondary prevention in patient with diabetes. Last LDL ws 155 and not controlled. ASCVD history high intensity statin indicated. Aspirin is indicated.  -Continued aspirin 81 mg  -Continued rosuvastatin 40 mg.  - Start Ezetimibe (Zetia) 10mg  once daily

## 2020-03-22 NOTE — Patient Instructions (Signed)
Nice to see you today.    Change Lantus from 45 units in the morning to 40 units each morning.   Change Novolog to 12 units prior to meals,  If your blood sugar is > 250 please use 15 units   Start Zetia (ezetmibe) 10mg  once daily.

## 2020-03-22 NOTE — Assessment & Plan Note (Signed)
>>  ASSESSMENT AND PLAN FOR UNCONTROLLED TYPE 2 DIABETES MELLITUS WITH HYPERGLYCEMIA (HCC) WRITTEN ON 03/22/2020 11:41 AM BY KOVAL, PETER G, RPH-CPP  Diabetes longstanding > 10 years currrently much improved. Patient is able to verbalize appropriate hypoglycemia management plan. Medication adherence appears good since addition of prandial insulin. Control is much improved with readings in the high 100s. . -Decreased dose of basal insulin from 45 to 40 (insulin glargine).  -Increased dose of  rapid insulin from 10 to 12 with sliding scale for > 250 of 15 units  (insulin aspart).  -Continued SGLT2-I 10mg  (generic name dapagliflozin). -Extensively discussed pathophysiology of diabetes, recommended lifestyle interventions, dietary effects on blood sugar control -Counseled on s/sx of and management of hypoglycemia

## 2020-04-02 ENCOUNTER — Telehealth: Payer: Self-pay | Admitting: Family Medicine

## 2020-04-02 NOTE — Telephone Encounter (Signed)
Pt data is in [facility] not [employer], data has been printed and place on your desk

## 2020-04-02 NOTE — Telephone Encounter (Signed)
Cna you see if you can get me a CPAP download for review? I am unable to pull correct patient by DOB in [employer]. TY.

## 2020-04-04 ENCOUNTER — Telehealth: Payer: Self-pay

## 2020-04-04 NOTE — Telephone Encounter (Signed)
Called pt to inform her that Amy Ubaldo Glassing has reviewed her BiPAP data. She has been using BiPAP consistently and just shy of recommended 4 hour use. Now at 65% and insurance requirement is 70. I encouraged her to continue working on compliance and to contact us with further concerns. She understood

## 2020-04-04 NOTE — Telephone Encounter (Signed)
Please let her know that I have reviewed data. She has been using BiPAP consistently and just shy of recommended 4 hour use. Now at 65% and insurance requirement is 70. Encourage her to continue working on compliance and make sure she has follow up scheduled. TY!

## 2020-04-05 ENCOUNTER — Other Ambulatory Visit: Payer: Self-pay | Admitting: Cardiology

## 2020-04-05 ENCOUNTER — Other Ambulatory Visit: Payer: Self-pay | Admitting: Family Medicine

## 2020-04-05 NOTE — Telephone Encounter (Signed)
Patient pharmacy is requesting refill for BRILINTA 90 MG BID PO. Is Dr. [place] ok with refilling this?

## 2020-04-06 ENCOUNTER — Other Ambulatory Visit: Payer: Self-pay

## 2020-04-06 DIAGNOSIS — E1165 Type 2 diabetes mellitus with hyperglycemia: Secondary | ICD-10-CM

## 2020-04-06 MED ORDER — DAPAGLIFLOZIN PROPANEDIOL 10 MG PO TABS: 10.0000 mg | ORAL_TABLET | Freq: Every day | ORAL | 3 refills | Status: DC

## 2020-04-06 NOTE — Telephone Encounter (Signed)
We stopped Brilinta on her a year ago. Do not refill.  Satoru Milich [place] MD, [facility]

## 2020-04-06 NOTE — Telephone Encounter (Signed)
Pt is requesting refill, would you be ok filling this medication?

## 2020-04-12 ENCOUNTER — Other Ambulatory Visit: Payer: Self-pay

## 2020-04-12 ENCOUNTER — Ambulatory Visit (INDEPENDENT_AMBULATORY_CARE_PROVIDER_SITE_OTHER): Payer: Medicaid Other | Admitting: Pharmacist

## 2020-04-12 ENCOUNTER — Encounter: Payer: Self-pay | Admitting: Pharmacist

## 2020-04-12 DIAGNOSIS — E785 Hyperlipidemia, unspecified: Secondary | ICD-10-CM

## 2020-04-12 DIAGNOSIS — E1169 Type 2 diabetes mellitus with other specified complication: Secondary | ICD-10-CM | POA: Diagnosis not present

## 2020-04-12 DIAGNOSIS — E1165 Type 2 diabetes mellitus with hyperglycemia: Secondary | ICD-10-CM

## 2020-04-12 DIAGNOSIS — I10 Essential (primary) hypertension: Secondary | ICD-10-CM

## 2020-04-12 MED ORDER — NOVOLOG FLEXPEN 100 UNIT/ML ~~LOC~~ SOPN: 14.0000 [IU] | PEN_INJECTOR | Freq: Three times a day (TID) | SUBCUTANEOUS | Status: DC

## 2020-04-12 NOTE — Assessment & Plan Note (Signed)
Hypertension longstanding currently controlled at 130/68 mmHg today.  Blood pressure goal = 140/90 mmHg. Medication adherence appears good. -Continued amlodipine 5 mg daily -Continued carvedilol 12.5 mg BID -Continued lisinopril 10 mg daily

## 2020-04-12 NOTE — Assessment & Plan Note (Signed)
>>  ASSESSMENT AND PLAN FOR HYPERTENSION, BENIGN ESSENTIAL WRITTEN ON 04/12/2020  1:50 PM BY KOVAL, PETER G, RPH-CPP  Hypertension longstanding currently controlled at 130/68 mmHg today.  Blood pressure goal = 140/90 mmHg. Medication adherence appears good. -Continued amlodipine 5 mg daily -Continued carvedilol 12.5 mg BID -Continued lisinopril 10 mg daily

## 2020-04-12 NOTE — Progress Notes (Signed)
S:     Chief Complaint  Patient presents with  . Medication Management    DM follow-up    Patient arrives in good spirits, walking slowly with a rolling walker.  Presents for diabetes evaluation, education, and management Patient was referred and last seen by Primary Care Provider on 03/06/2020.   Patient has had diabetes diagnosis for >10 years (since 2009).   Patient reports that her fasting blood glucose readings have been in the 160's to low 200s. She says that postprandial levels have been around 180. She has been taking 12 units of Novolog TID with meals and recalls using 15 units only once per her sliding scale (BG >250). Patient denies any symptoms of hypoglycemia and states that her lowest BG reading since her last visit was around 150.   Patient reports that she is tolerating Zetia (ezetimibe) well with no side effects.   Insurance coverage/medication affordability: Medicaid  Medication adherence seems good.   Current diabetes medications include: Lantus (insulin glargine) 40 units daily taken in the morning, Novolog (insulin aspart) 12 units TID with meals, metformin 1000 mg BID with meals, Farxiga (dapagliflozin) 10 mg once daily Current hypertension medications include: amlodipine 5 mg daily, carvedilol 12.5 mg BID with meals, lisinopril 10 mg daily Current hyperlipidemia medications include: rosuvastatin 40 mg daily, Zetia (ezetimibe) 10 mg daily  Patient reports eating 3 meals/day.   Patient denies nocturia (nighttime urination).  Patient denies neuropathy (nerve pain). Patient reports visual changes. Patient reports self foot exams.     O:  Physical Exam Constitutional:      Appearance: Normal appearance.  Pulmonary:     Effort: Pulmonary effort is normal.  Neurological:     Mental Status: She is alert.  Psychiatric:        Mood and Affect: Mood normal.        Behavior: Behavior normal.      Review of Systems  Constitutional: Negative for  diaphoresis.  Eyes: Negative for blurred vision.  Genitourinary: Negative for frequency and urgency.  Neurological: Negative for dizziness and tremors.  Endo/Heme/Allergies: Negative for polydipsia.     Lab Results  Component Value Date   HGBA1C 13.8 (A) 03/06/2020   Vitals:   04/12/20 1017  BP: 130/68  Pulse: 71  SpO2: 96%    Lipid Panel     Component Value Date/Time   CHOL 266 (H) 03/06/2020 0915   TRIG 403 (H) 03/06/2020 0915   HDL 35 (L) 03/06/2020 0915   CHOLHDL 7.6 (H) 03/06/2020 0915   CHOLHDL 5.1 04/29/2017 0557   VLDL 32 04/29/2017 0557   LDLCALC 155 (H) 03/06/2020 0915   LDLDIRECT 135 (H) 12/31/2012 0949    Home fasting blood sugars: 160-low 200s 2 hour post-meal/random blood sugars: 180's.   Clinical Atherosclerotic Cardiovascular Disease (ASCVD): Yes  The ASCVD Risk score Mikey Bussing DC Jr., et al., 2013) failed to calculate for the following reasons:   The patient has a prior MI or stroke diagnosis    A/P: Diabetes longstanding >10 years and currently improved. Patient is able to verbalize appropriate hypoglycemia management plan. Medication adherence appears good. Control is improved with most home readings reported to be in the high 100's. -Continued basal insulin 40 units daily (insulin glargine).   -Increased dose of  rapid insulin (insulin aspart) from 12 units TID with meals (15 units if BG>250) to 14 units TID with meals.  -Continued SGLT2-I Farxiga (dapagliflozin) 10 mg daily. -Counseled on s/sx of and management of hypoglycemia *  NOTE - patient GI intolerant to multiple GLP agents (added to intolerance list today)  -Next A1C anticipated  05/2020.   ASCVD risk - secondary prevention in patient with diabetes with history of CAD and unstable angina. In February,  LDL was 155 and controlled. LDL goal =  <100 mg/dL. High intensity statin indicated. No intolerance to Zetia (ezetimibe) started at last visit.  Aspirin is indicated.  -Continued aspirin 81 mg  daily -Continued rosuvastatin 40 mg daily  -Continued ezetimibe 10 mg daily Repeat direct LDL at next blood draw (next visit).   Hypertension longstanding currently controlled at 130/68 mmHg today.  Blood pressure goal = 140/90 mmHg. Medication adherence appears good. -Continued amlodipine 5 mg daily -Continued carvedilol 12.5 mg BID -Continued lisinopril 10 mg daily   Written patient instructions provided.  Total time in face to face counseling 25 minutes.   Follow up PCP Clinic Visit in 6 weeks.   Patient seen with Inis Sizer, PharmD Candidate, and Shauna Hugh, PharmD - PGY-1 Resident, and Lorel Monaco, PharmD, BCPS - PGY2 Pharmacy Resident.

## 2020-04-12 NOTE — Progress Notes (Signed)
Reviewed: I agree with Dr. Koval's documentation and management. 

## 2020-04-12 NOTE — Patient Instructions (Addendum)
It was nice to see you today!  Your goal blood sugar is 80-130 before eating and less than 180 after eating.  Medication Changes: Starting taking Novolog 14 units with meals   Continue taking Lantus 40 units daily  Continue taking Farxiga 10 mg daily  Monitor blood sugars at home and keep a log (glucometer or piece of paper) to bring with you to your next visit.  Keep up the good work with diet and exercise. Aim for a diet full of vegetables, fruit and lean meats (chicken, [place], fish). Try to limit salt intake by eating fresh or frozen vegetables (instead of canned), rinse canned vegetables prior to cooking and do not add any additional salt to meals.

## 2020-04-12 NOTE — Assessment & Plan Note (Signed)
>>  ASSESSMENT AND PLAN FOR UNCONTROLLED TYPE 2 DIABETES MELLITUS WITH HYPERGLYCEMIA (HCC) WRITTEN ON 04/12/2020  2:19 PM BY KOVAL, PETER G, RPH-CPP  Diabetes longstanding >10 years and currently improved. Patient is able to verbalize appropriate hypoglycemia management plan. Medication adherence appears good. Control is improved with most home readings reported to be in the high 100's. -Continued basal insulin 40 units daily (insulin glargine).   -Increased dose of  rapid insulin (insulin aspart) from 12 units TID with meals (15 units if BG>250) to 14 units TID with meals.  -Continued SGLT2-I Farxiga (dapagliflozin) 10 mg daily. -Counseled on s/sx of and management of hypoglycemia *NOTE - patient GI intolerant to multiple GLP agents (added to intolerance list today)

## 2020-04-12 NOTE — Assessment & Plan Note (Signed)
ASCVD risk - secondary prevention in patient with diabetes with history of CAD and unstable angina. In February,  LDL was 155 and controlled. LDL goal =  <100 mg/dL. High intensity statin indicated. No intolerance to Zetia (ezetimibe) started at last visit.  Aspirin is indicated.  -Continued aspirin 81 mg daily -Continued rosuvastatin 40 mg daily  -Continued ezetimibe 10 mg daily Repeat direct LDL at next blood draw (next visit).

## 2020-04-12 NOTE — Assessment & Plan Note (Addendum)
Diabetes longstanding >10 years and currently improved. Patient is able to verbalize appropriate hypoglycemia management plan. Medication adherence appears good. Control is improved with most home readings reported to be in the high 100's. -Continued basal insulin 40 units daily (insulin glargine).   -Increased dose of  rapid insulin (insulin aspart) from 12 units TID with meals (15 units if BG>250) to 14 units TID with meals.  -Continued SGLT2-I Farxiga (dapagliflozin) 10 mg daily. -Counseled on s/sx of and management of hypoglycemia *NOTE - patient GI intolerant to multiple GLP agents (added to intolerance list today)

## 2020-04-14 ENCOUNTER — Other Ambulatory Visit: Payer: Self-pay | Admitting: Family Medicine

## 2020-04-14 DIAGNOSIS — E1165 Type 2 diabetes mellitus with hyperglycemia: Secondary | ICD-10-CM

## 2020-04-17 ENCOUNTER — Other Ambulatory Visit: Payer: Self-pay | Admitting: Family Medicine

## 2020-04-25 ENCOUNTER — Ambulatory Visit
Admission: RE | Admit: 2020-04-25 | Discharge: 2020-04-25 | Disposition: A | Discharge status: Home / Self Care | Payer: Medicaid Other | Source: Ambulatory Visit | Attending: Family Medicine | Admitting: Family Medicine

## 2020-04-25 ENCOUNTER — Other Ambulatory Visit: Payer: Self-pay

## 2020-04-25 DIAGNOSIS — Z1231 Encounter for screening mammogram for malignant neoplasm of breast: Secondary | ICD-10-CM

## 2020-05-19 NOTE — Progress Notes (Signed)
Cardiology Office Note   Date:  05/21/2020   ID:  Holly Acosta, Holly Acosta [birth year], MRN [id number]  PCP:  Holly Floro, DO   Cardiologist: Dr. Martinique  Chief Complaint  Patient presents with  . Coronary Artery Disease     History of Present Illness: Holly Acosta is a 65 y.o. female who presents for ongoing assessment and management of coronary artery disease. She has a history of DM, HTN, HLD, and OSA. Cardiac cath in 2008 showed no significant CAD. She was admitted with a NSTEMI in April 2019.  Troponin > 65. Cardiac catheterization showed severe stenosis of the left circumflex requiring drug-eluting stent, currently on dual antiplatelet therapy with Brilinta and aspirin.  Her hospital course was complication by an acute ischemic left cerebral infarct without frank hemorrhagic transformation and acute ischemic nonhemorrhagic right cerebellar infarct. The patient was transferred to skilled nursing facility for rehab.    She was admitted in January 2021 with Covid 19 PNA with hypoxic respiratory failure. Treated with decadron and remdesivir. As a result of this sugars have been poorly controlled with A1c 13.6%. She was tried on Victoza but couldn't tolerate it due to N/V.   On follow up today she reports she is doing well. Denies any chest pain or SOB. No palpitations. No TIA or CVA symptoms. Walks with a walker. Notes sugars have been better since insulin adjusted. Now on high dose Crestor, Zetia and fish oil for hyperlipidemia.   Past Medical History:  Diagnosis Date  . Acute cerebrovascular accident (CVA) of cerebellum ([place]) 04/29/2017  . Acute cerebrovascular accident (CVA) of cerebellum ([place]) 04/29/2017  . Acute respiratory failure with hypoxia ([place])   . AKI (acute kidney injury) ([place])   . Anemia   . Arthritis of knee    "both knees" (04/24/2017)  . Cerebrovascular accident (CVA) due to occlusion of left posterior cerebral artery ([place]) 12/03/2017  . Chest pain 04/24/2017  . COLONIC  POLYPS, HYPERPLASTIC 09/13/2009   Qualifier: Diagnosis of  By: Amil Amen MD, Benjamine Mola    . Constipation 06/02/2017  . Coronary artery disease involving native coronary artery of native heart with unstable angina pectoris ([place]) 04/30/2017  . Dehydration   . Diabetes mellitus, type 2 ([place])   . History of blood transfusion    "related to prolapsed uterus"  . HLD (hyperlipidemia)   . HTN (hypertension)   . Non-ST elevation (NSTEMI) myocardial infarction ([place])   . Non-ST elevation (NSTEMI) myocardial infarction ([place])   . Pneumonia due to COVID-19 virus 02/18/2019  . Prolapsed uterus   . Severe obstructive sleep apnea-hypopnea syndrome 01/01/2018  . Sleep apnea    "have mask; haven't worn it for a long time" (04/24/2017)  . Status post coronary artery stent placement     Past Surgical History:  Procedure Laterality Date  . CARDIAC CATHETERIZATION  2011  . CORONARY STENT INTERVENTION N/A 04/27/2017   Procedure: CORONARY STENT INTERVENTION;  Surgeon: Jettie Booze, MD;  Location: [facility];  Service: Cardiovascular;  Laterality: N/A;  . LEFT HEART CATH AND CORONARY ANGIOGRAPHY N/A 04/27/2017   Procedure: LEFT HEART CATH AND CORONARY ANGIOGRAPHY;  Surgeon: Jettie Booze, MD;  Location: [facility];  Service: Cardiovascular;  Laterality: N/A;  . TUBAL LIGATION       Current Outpatient Medications  Medication Sig Dispense Refill  . ACCU-CHEK FASTCLIX LANCETS MISC 1 each by Does not apply route daily. Check sugar daily as needed 102 each 3  . acetaminophen (TYLENOL)  500 MG tablet Take 1,000 mg by mouth every 12 (twelve) hours as needed for mild pain.    Marland Kitchen amLODipine (NORVASC) 5 MG tablet TAKE 1 TABLET BY MOUTH EVERY DAY 90 tablet 3  . aspirin EC 81 MG tablet Take 1 tablet (81 mg total) by mouth daily. 90 tablet 3  . carvedilol (COREG) 12.5 MG tablet TAKE 1 TABLET BY MOUTH 2 TIMES DAILY WITH A MEAL. 180 tablet 3  . dapagliflozin propanediol (FARXIGA) 10 MG TABS tablet  Take 1 tablet (10 mg total) by mouth daily. 90 tablet 3  . ezetimibe (ZETIA) 10 MG tablet Take 1 tablet (10 mg total) by mouth daily. 90 tablet 3  . glucose blood (ACCU-CHEK GUIDE) test strip TEST BLOOD SUGAR UP TO 3 TIMES A DAY 300 each 11  . insulin aspart (NOVOLOG FLEXPEN) 100 UNIT/ML FlexPen Inject 14 Units into the skin 3 (three) times daily with meals. 15 mL   . Insulin Pen Needle (B-D UF III MINI PEN NEEDLES) 31G X 5 MM MISC USE AS DIRECTED 3 TIMES A DAY 100 each 2  . LANTUS SOLOSTAR 100 UNIT/ML Solostar Pen INJECT 32 UNITS INTO SKIN DAILY 15 mL 11  . lisinopril (ZESTRIL) 10 MG tablet Take 1 tablet (10 mg total) by mouth daily. 30 tablet 11  . metFORMIN (GLUCOPHAGE) 1000 MG tablet Take 1 tablet (1,000 mg total) by mouth 2 (two) times daily with a meal. 180 tablet 3  . Omega-3 Fatty Acids (FISH OIL) 1000 MG CAPS Take 1 mg by mouth in the morning and at bedtime.    . rosuvastatin (CRESTOR) 40 MG tablet TAKE 1 TABLET BY MOUTH EVERY DAY 90 tablet 3   No current facility-administered medications for this visit.    Allergies:   Lipitor [atorvastatin], Other, Diclofenac, and Trulicity [dulaglutide]    Social History:  The patient  reports that she quit smoking about 13 years ago. Her smoking use included cigarettes. She started smoking about 47 years ago. She has a 33.00 pack-year smoking history. She has never used smokeless tobacco. She reports that she does not drink alcohol and does not use drugs.   Family History:  The patient's family history includes Diabetes in her mother; Hypertension in her father and mother.    ROS: All other systems are reviewed and negative. Unless otherwise mentioned in H&P    PHYSICAL EXAM: VS:  BP (!) 144/72 (BP Location: Left Arm, Patient Position: Sitting, Cuff Size: Normal)   Pulse 65   Ht 5\' 5"  (1.651 m)   Wt 176 lb 3.2 oz (79.9 kg)   BMI 29.32 kg/m  , BMI Body mass index is 29.32 kg/m. GEN: Well nourished, well developed, in no acute distress   HEENT: normal  Neck: no JVD, carotid bruits, or masses Cardiac: RRR; no murmurs, rubs, or gallops,no edema  Respiratory:  clear to auscultation bilaterally, normal work of breathing GI: soft, nontender, nondistended, + BS MS: no deformity or atrophy  Skin: warm and dry, no rash Neuro:  Strength and sensation are intact Psych: euthymic mood, full affect   EKG:  Today shows NSR rate 65. LAD, LVH by voltage. I have personally reviewed and interpreted this study.   Recent Labs: 03/06/2020: BUN 15; Creatinine, Ser 1.11; Potassium 4.5; Sodium 136  02/18/19: A1c 13.6%  Lipid Panel    Component Value Date/Time   CHOL 266 (H) 03/06/2020 0915   TRIG 403 (H) 03/06/2020 0915   HDL 35 (L) 03/06/2020 0915   CHOLHDL 7.6 (H)  03/06/2020 0915   CHOLHDL 5.1 04/29/2017 0557   VLDL 32 04/29/2017 0557   LDLCALC 155 (H) 03/06/2020 0915   LDLDIRECT 135 (H) 12/31/2012 0949      Wt Readings from Last 3 Encounters:  05/21/20 176 lb 3.2 oz (79.9 kg)  03/22/20 177 lb 9.6 oz (80.6 kg)  03/06/20 174 lb 6.4 oz (79.1 kg)      Other studies Reviewed:  Cardiac Cath 04/27/2017     Prox RCA lesion is 25% stenosed.  Mid RCA to Dist RCA lesion is 25% stenosed.  Prox LAD lesion is 25% stenosed.  The left ventricular systolic function is normal.  LV end diastolic pressure is mildly elevated. LVEDP 18 mm Hg.  The left ventricular ejection fraction is 55-65% by visual estimate.  There is no aortic valve stenosis.  Prox Cx lesion is 99% stenosed. This was the culprit lesion.  A drug-eluting stent was successfully placed using a STENT SIERRA 3.50 X 18 MM.  Post intervention, there is a 0% residual stenosis.   Continue dual antiplatelet therapy for at least one year with aggressive secondary prevention.       Echocardiogram 04/29/2017  Left ventricle: The cavity size was normal. There was moderate   concentric hypertrophy. Systolic function was normal. The   estimated ejection fraction was  in the range of 55% to 60%.   Doppler parameters are consistent with abnormal left ventricular   relaxation (grade 1 diastolic dysfunction). There was no evidence   of elevated ventricular filling pressure by Doppler parameters. - Mitral valve: There was no regurgitation. Valve area by pressure   half-time: 2.08 cm^2. - Left atrium: The atrium was normal in size. - Right ventricle: Systolic function was normal. - Right atrium: The atrium was normal in size. - Tricuspid valve: There was no regurgitation. - Pericardium, extracardiac: The pericardium was normal in   appearance.  Impressions:  - There is hypokinesis of the basal inferior, inferolateral and mid   inferolateral walls, overall LVEF is preserved at 55-60%.   ASSESSMENT AND PLAN:  1.  CAD: S/P NSTEMI April 2019 - s/p DES of LCx.  Continue ASA 81 mg daily.   Continue Coreg and ACEi. She is asymptomatic.   2. CVA: Bilateral cerebral non-hemmorhagic infarcts- embolic following cardiac cath/PCI. Improved.   3. Hypercholesterolemia- combined: on high dose Crestor, Zetia, fish oil. Plan follow up labs with PCP. If LDL still not at goal < 70 would consider addition of PCSK 9 inhibitor like Repatha.   4. Diabetes: poorly controlled. On metformin, Farxiga and lantus and Novolog insulin. Per primary care.   5. OSA. Reports she is using CPAP.  Follow up in one year  Babetta Paterson Martinique MD, [facility]     05/21/2020 3:54 PM    [place]

## 2020-05-21 ENCOUNTER — Other Ambulatory Visit: Payer: Self-pay

## 2020-05-21 ENCOUNTER — Ambulatory Visit (INDEPENDENT_AMBULATORY_CARE_PROVIDER_SITE_OTHER): Payer: Medicaid Other | Admitting: Cardiology

## 2020-05-21 ENCOUNTER — Encounter: Payer: Self-pay | Admitting: Cardiology

## 2020-05-21 VITALS — BP 144/72 | HR 65 | Ht 65.0 in | Wt 176.2 lb

## 2020-05-21 DIAGNOSIS — I10 Essential (primary) hypertension: Secondary | ICD-10-CM | POA: Diagnosis not present

## 2020-05-21 DIAGNOSIS — E785 Hyperlipidemia, unspecified: Secondary | ICD-10-CM

## 2020-05-21 DIAGNOSIS — Z8673 Personal history of transient ischemic attack (TIA), and cerebral infarction without residual deficits: Secondary | ICD-10-CM

## 2020-05-21 DIAGNOSIS — I2510 Atherosclerotic heart disease of native coronary artery without angina pectoris: Secondary | ICD-10-CM | POA: Diagnosis not present

## 2020-06-01 ENCOUNTER — Other Ambulatory Visit: Payer: Self-pay | Admitting: Family Medicine

## 2020-06-01 DIAGNOSIS — E1165 Type 2 diabetes mellitus with hyperglycemia: Secondary | ICD-10-CM

## 2020-06-26 ENCOUNTER — Other Ambulatory Visit: Payer: Self-pay | Admitting: Cardiology

## 2020-08-01 IMAGING — MG DIGITAL SCREENING BILATERAL MAMMOGRAM WITH TOMO AND CAD
8 series · 8 of 24 positions shown · non-contrast
Comparison: Previous exam(s).

CLINICAL DATA: Screening.

EXAM:
DIGITAL SCREENING BILATERAL MAMMOGRAM WITH TOMO AND CAD

[L CC synth-2D]
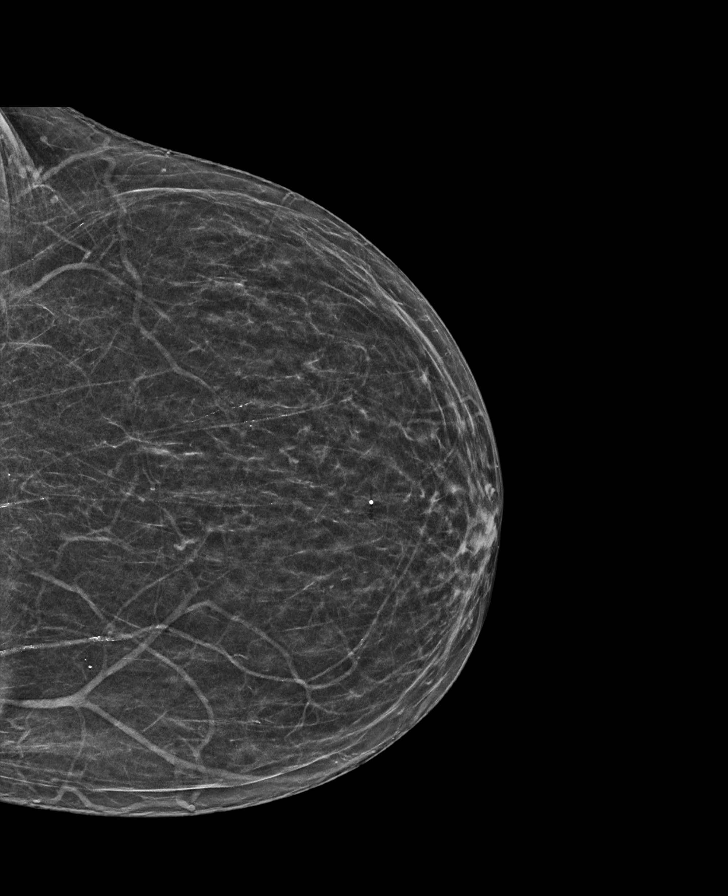

[L MLO synth-2D]
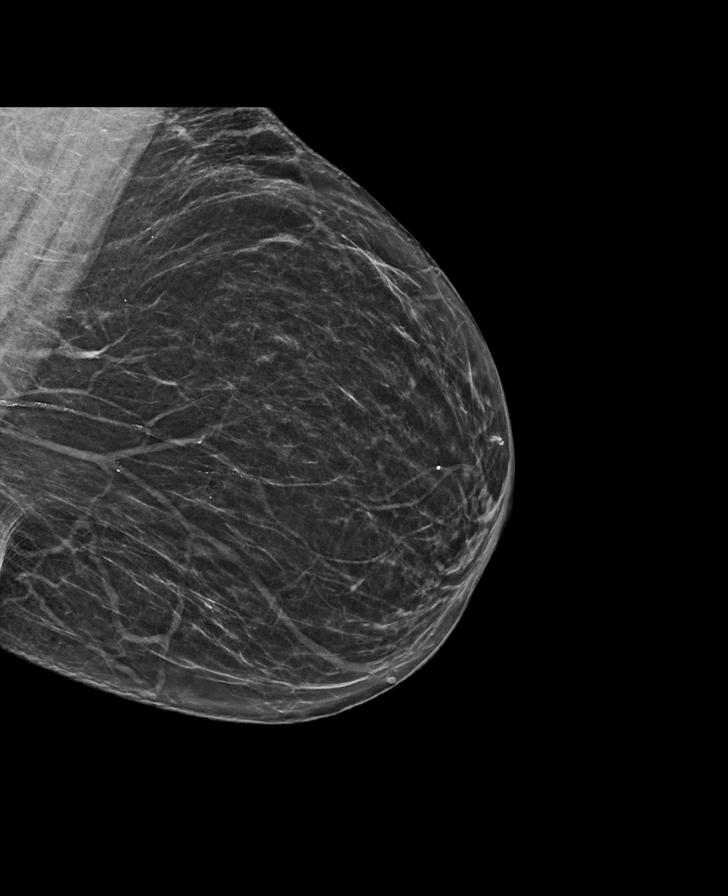

[R MLO synth-2D]
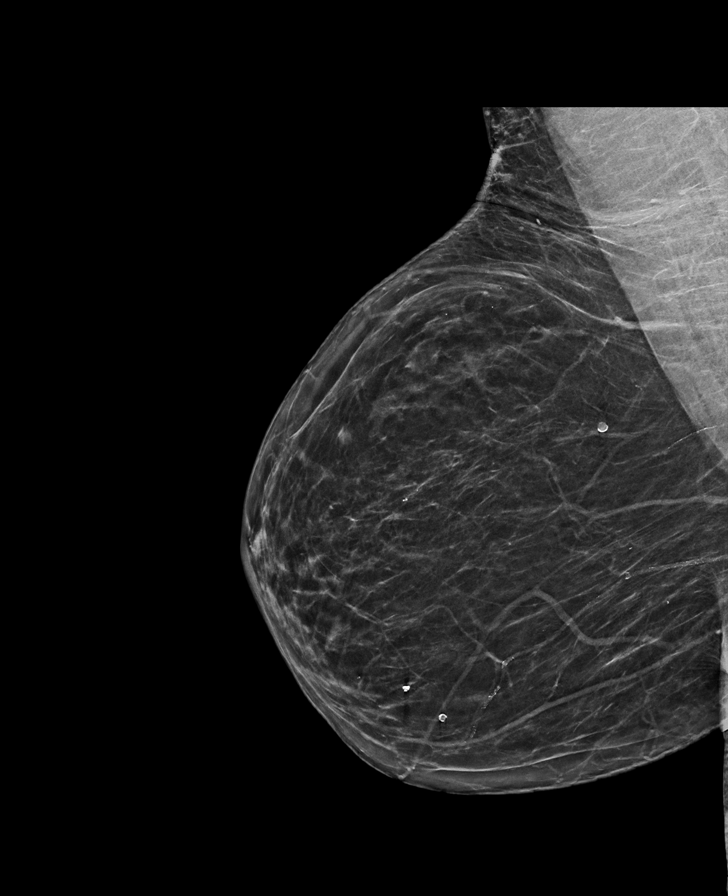

[R CC synth-2D]
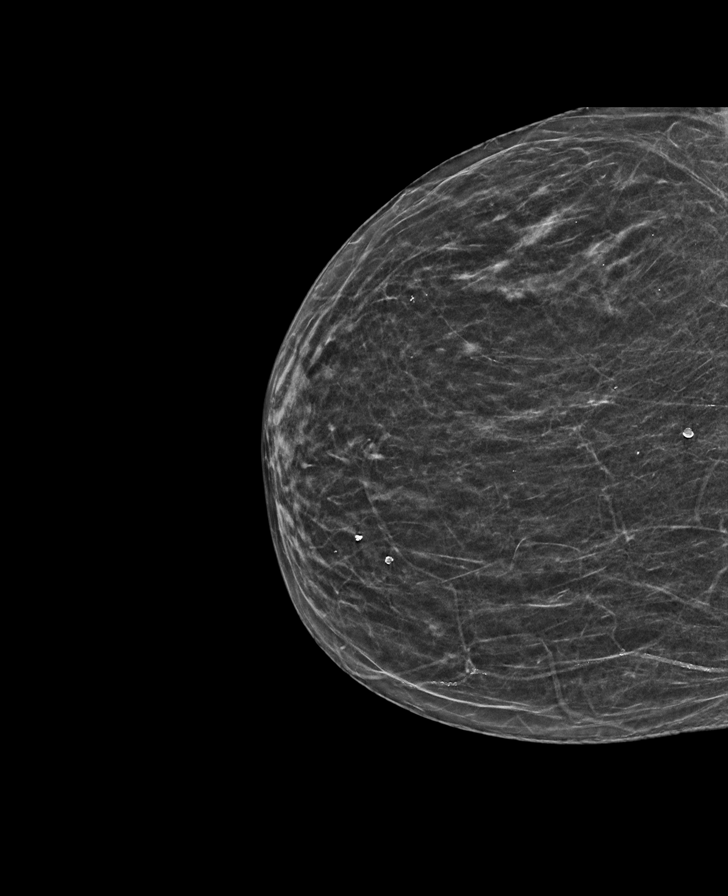

[L CC tomo · tomo slice 25/49.0]
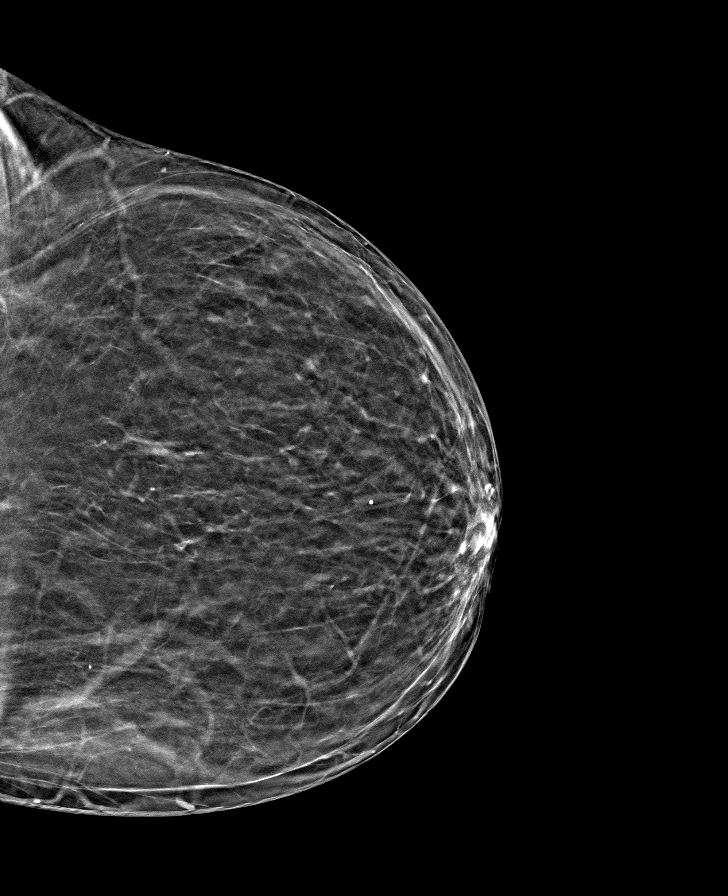

[R CC tomo · tomo slice 25/49.0]
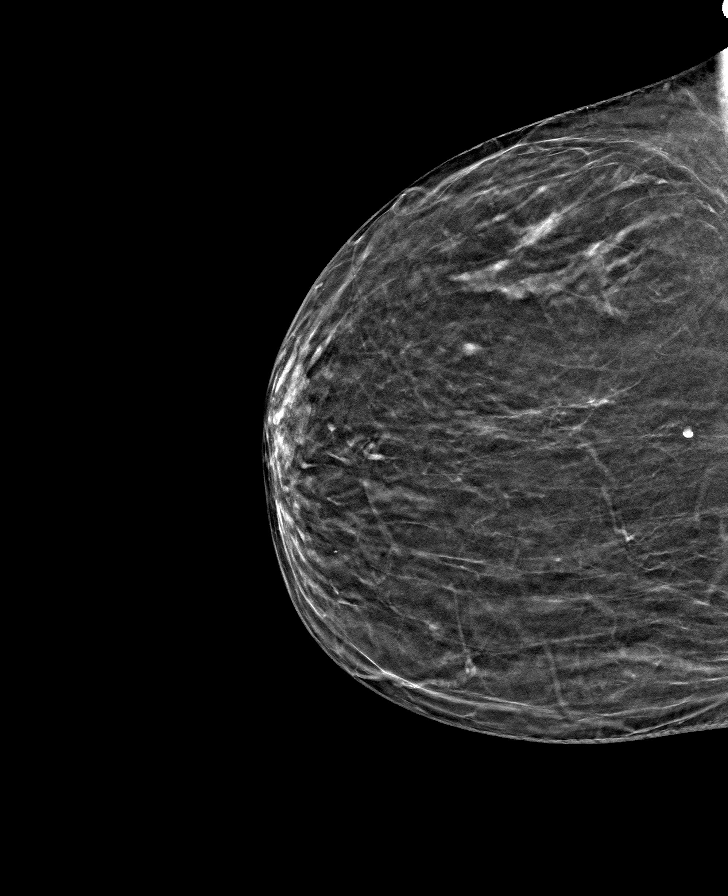

[L MLO tomo · tomo slice 31/61.0]
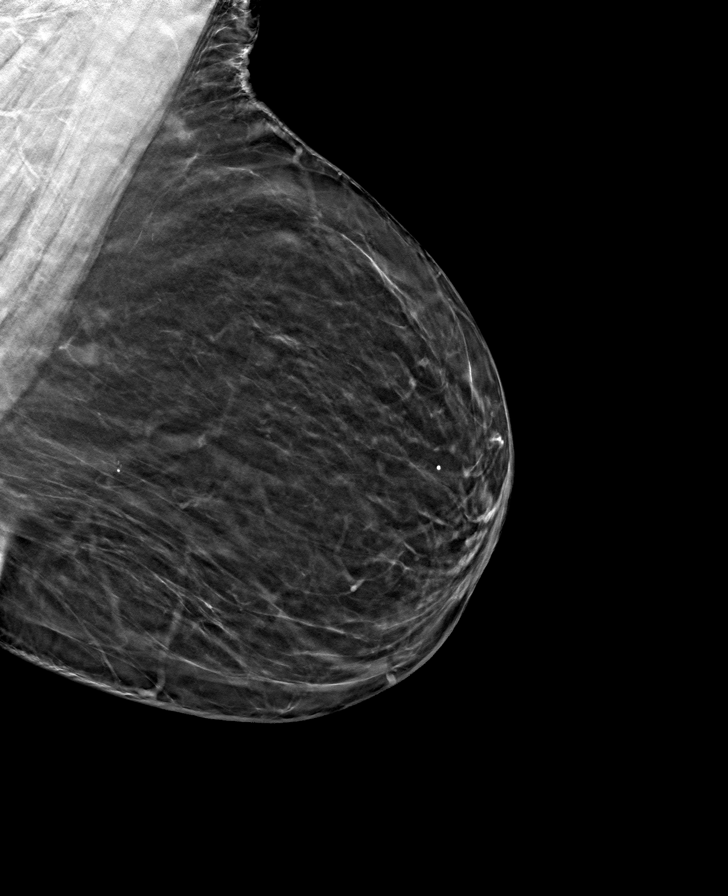

[R MLO tomo · tomo slice 31/60.0]
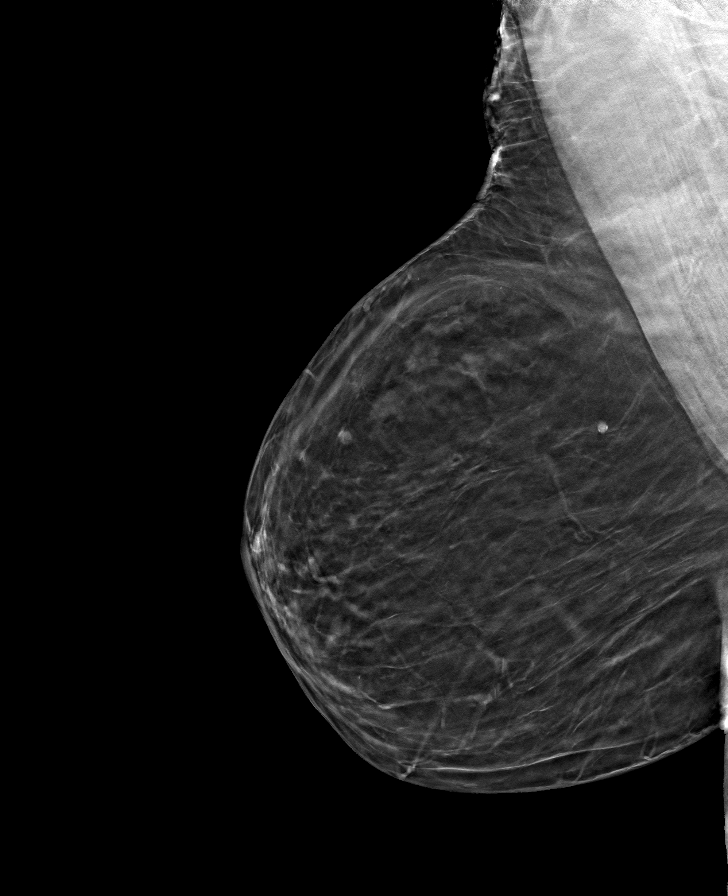

[8 of 24 positions shown; findings below may reference images not displayed]

ACR Breast Density Category b: There are scattered areas of
fibroglandular density.
FINDINGS: There are no findings suspicious for malignancy. Images were
processed with CAD.
IMPRESSION: No mammographic evidence of malignancy. A result letter of this
screening mammogram will be mailed directly to the patient.

RECOMMENDATION:
Screening mammogram in one year. (Code:[id number])

BI-RADS CATEGORY  1: Negative.

## 2020-08-13 ENCOUNTER — Other Ambulatory Visit: Payer: Self-pay | Admitting: Family Medicine

## 2020-09-06 DIAGNOSIS — H2511 Age-related nuclear cataract, right eye: Secondary | ICD-10-CM | POA: Diagnosis not present

## 2020-09-06 DIAGNOSIS — E113293 Type 2 diabetes mellitus with mild nonproliferative diabetic retinopathy without macular edema, bilateral: Secondary | ICD-10-CM | POA: Diagnosis not present

## 2020-10-09 ENCOUNTER — Other Ambulatory Visit: Payer: Self-pay

## 2020-10-09 ENCOUNTER — Encounter: Payer: Self-pay | Admitting: Family Medicine

## 2020-10-09 ENCOUNTER — Ambulatory Visit (INDEPENDENT_AMBULATORY_CARE_PROVIDER_SITE_OTHER): Payer: Medicare Other | Admitting: Family Medicine

## 2020-10-09 VITALS — BP 152/83 | HR 90 | Wt 176.2 lb

## 2020-10-09 DIAGNOSIS — E1165 Type 2 diabetes mellitus with hyperglycemia: Secondary | ICD-10-CM

## 2020-10-09 DIAGNOSIS — I10 Essential (primary) hypertension: Secondary | ICD-10-CM | POA: Diagnosis not present

## 2020-10-09 DIAGNOSIS — E785 Hyperlipidemia, unspecified: Secondary | ICD-10-CM | POA: Diagnosis not present

## 2020-10-09 DIAGNOSIS — Z23 Encounter for immunization: Secondary | ICD-10-CM

## 2020-10-09 DIAGNOSIS — E1169 Type 2 diabetes mellitus with other specified complication: Secondary | ICD-10-CM

## 2020-10-09 LAB — POCT GLYCOSYLATED HEMOGLOBIN (HGB A1C): HbA1c, POC (controlled diabetic range): 11.8 % — AB (ref 0.0–7.0)

## 2020-10-09 NOTE — Patient Instructions (Signed)
It was great seeing you today!  Today we discussed your diabetes and elevated blood pressure. Please try to eat plenty of vegetables and stay active throughout the day, this will help with both your hypertension and diabetes. Make sure to take all your medications as prescribed. Please bring a record of blood pressure readings to your next visit.   Please follow up at your next scheduled appointment in 6 months, if anything arises between now and then, please don't hesitate to contact our office.   Thank you for allowing [place] to be a part of your medical care!  Thank you, Dr. Larae Grooms

## 2020-10-09 NOTE — Assessment & Plan Note (Signed)
>>  ASSESSMENT AND PLAN FOR HYPERTENSION, BENIGN ESSENTIAL WRITTEN ON 10/09/2020  7:56 PM BY GANTA, ANUPA, DO  -BP 152/82, although not at goal, likely due to patient not taking antihypertensives today -continue antihypertensive regimen without any changes -diet and exercise counseling discussed

## 2020-10-09 NOTE — Progress Notes (Signed)
    SUBJECTIVE:   CHIEF COMPLAINT / HPI:   Hypertension Patient compliant on home regimen of amlodipine, coreg and lisinopril. Conveys direct compliance although did not take her medications this morning as she had a late night last night and got up late this morning. Denies chest pain, dyspnea, leg swelling and palpitations. Checks blood pressure regularly at home and reports that systolic is typically within the 120-130 range.   DM Compliant on home regimen. Denies any hypoglycemic episodes or dizziness. Checks blood glucose levels at home which typically range between 120-170. Went to the ophthalmologist last month. Last A1c 13.8 in Feb 2022.    OBJECTIVE:   BP (!) 152/83   Pulse 90   Wt 176 lb 3.2 oz (79.9 kg)   SpO2 96%   BMI 29.32 kg/m   General: Patient well-appearing, in no acute distress. CV: RRR, no murmurs or gallops auscultated Resp: CTAB Abdomen: soft, nontender, presence of bowel sounds Ext: no LE edema noted bilaterally, radial and distal pulses strong and equal bilaterally, normal foot exam bilaterally without wounds, gross sensation intact, reflexes intact  Neuro: ambulates with assistance of cane Psych: mood appropriate, very pleasant   ASSESSMENT/PLAN:   HYPERTENSION, BENIGN ESSENTIAL -BP 152/82, although not at goal, likely due to patient not taking antihypertensives today -continue antihypertensive regimen without any changes -diet and exercise counseling discussed   Uncontrolled type 2 diabetes mellitus with hyperglycemia ([facility]) -A1c 11.8 today, discussed with patient, improvement noted -continue on diabetic regimen -extensively discussed lifestyle modifications diet and exercise  -repeat A1c at next visit and adjust insulin regimen as appropriate  -encouraged to continue to have optho follow up annually -foot exam performed today normal   Hyperlipidemia associated with type 2 diabetes mellitus ([place]) -continue crestor and zetia -cholesterol 266 and  triglycerides 406 on last lipid panel -repeat lipid panel at next annual visit    Health maintenance -will need colonoscopy screening and DEXA, discuss at next visit   Donney Dice, [place]

## 2020-10-09 NOTE — Assessment & Plan Note (Signed)
>>  ASSESSMENT AND PLAN FOR UNCONTROLLED TYPE 2 DIABETES MELLITUS WITH HYPERGLYCEMIA (HCC) WRITTEN ON 10/09/2020  7:58 PM BY GANTA, ANUPA, DO  -A1c 11.8 today, discussed with patient, improvement noted -continue on diabetic regimen -extensively discussed lifestyle modifications diet and exercise  -repeat A1c at next visit and adjust insulin regimen as appropriate  -encouraged to continue to have optho follow up annually -foot exam performed today normal

## 2020-10-09 NOTE — Assessment & Plan Note (Signed)
-  continue crestor and zetia -cholesterol 266 and triglycerides 406 on last lipid panel -repeat lipid panel at next annual visit

## 2020-10-09 NOTE — Assessment & Plan Note (Signed)
-  BP 152/82, although not at goal, likely due to patient not taking antihypertensives today -continue antihypertensive regimen without any changes -diet and exercise counseling discussed

## 2020-10-09 NOTE — Assessment & Plan Note (Signed)
-  A1c 11.8 today, discussed with patient, improvement noted -continue on diabetic regimen -extensively discussed lifestyle modifications diet and exercise  -repeat A1c at next visit and adjust insulin regimen as appropriate  -encouraged to continue to have optho follow up annually -foot exam performed today normal

## 2020-11-14 DIAGNOSIS — H2511 Age-related nuclear cataract, right eye: Secondary | ICD-10-CM | POA: Diagnosis not present

## 2020-11-14 DIAGNOSIS — Z01818 Encounter for other preprocedural examination: Secondary | ICD-10-CM | POA: Diagnosis not present

## 2020-11-14 DIAGNOSIS — E119 Type 2 diabetes mellitus without complications: Secondary | ICD-10-CM | POA: Diagnosis not present

## 2020-11-19 DIAGNOSIS — H2511 Age-related nuclear cataract, right eye: Secondary | ICD-10-CM | POA: Diagnosis not present

## 2020-11-25 ENCOUNTER — Other Ambulatory Visit: Payer: Self-pay | Admitting: Family Medicine

## 2020-12-05 ENCOUNTER — Other Ambulatory Visit: Payer: Self-pay

## 2020-12-05 DIAGNOSIS — E1165 Type 2 diabetes mellitus with hyperglycemia: Secondary | ICD-10-CM

## 2020-12-06 MED ORDER — INSULIN LISPRO (1 UNIT DIAL) 100 UNIT/ML (KWIKPEN): 14.0000 [IU] | PEN_INJECTOR | Freq: Three times a day (TID) | SUBCUTANEOUS | 0 refills | Status: DC

## 2020-12-07 ENCOUNTER — Other Ambulatory Visit: Payer: Self-pay | Admitting: Family Medicine

## 2020-12-28 DIAGNOSIS — H2512 Age-related nuclear cataract, left eye: Secondary | ICD-10-CM | POA: Diagnosis not present

## 2021-01-07 ENCOUNTER — Other Ambulatory Visit: Payer: Self-pay | Admitting: Family Medicine

## 2021-01-08 ENCOUNTER — Telehealth: Payer: Self-pay | Admitting: Family Medicine

## 2021-01-08 ENCOUNTER — Other Ambulatory Visit: Payer: Self-pay | Admitting: Family Medicine

## 2021-01-08 NOTE — Telephone Encounter (Signed)
Granddaughter dropped off form at front desk for [facility] handicap placard.  Verified that patient section of form has been completed.  Last DOS/WCC with PCP was 10/09/20.  Placed form in [facility] team folder to be completed by clinical staff.  Creig Hines

## 2021-01-08 NOTE — Telephone Encounter (Signed)
Patient contacted and informed of placard ready for pick up. A copy was made for batch scanning.

## 2021-01-31 ENCOUNTER — Ambulatory Visit: Payer: Medicaid Other | Admitting: Family Medicine

## 2021-02-03 ENCOUNTER — Other Ambulatory Visit: Payer: Self-pay | Admitting: Family Medicine

## 2021-02-22 NOTE — Progress Notes (Deleted)
PATIENT: Holly Acosta DOB: 1955-10-18  REASON FOR VISIT: follow up HISTORY FROM: patient  No chief complaint on file.    HISTORY OF PRESENT ILLNESS: 02/22/21 ALL: Holly Acosta returns for follow up for OSA on BiPAP.   02/01/2020 ALL:  She is doing well. She has resumed BiPAP therapy and is doing well. She denies difficulty with her machine or supplies. She has not heard anything regarding recall. She does note some air leaking from her mask but may need a replacement.   Compliance report dated 01/01/2020 through 01/30/2020 reveals that she used CPAP 29 of the past 30 days for compliance of 96.7%.  She used CPAP greater than 4 hours 66.7% of the time.  Average usage on days used was 4 hours and 21 minutes.  Residual AHI was 5.7 with a EPAP of 18 and IPAP of 21.  Average time in large leak per day was 1 hour and 39 minutes.   History (copied from previous notes) 10/26/2019 Holly Acosta is a 66 y.o. female here today for follow up for OSA on BiPAP. She was admitted to the hospital 01/2019 for PNA in setting of Covid.  She reports that she is recovered nicely.  She continues to follow-up very closely with primary care.  Diabetes has not been well controlled recently but she is working closely with her providers.  She has not used BiPAP consistently.  She has been concerned about the recall.  She thinks that someone told her her machine was not effective.  She does not use an external cleanser.  She does not expose BiPAP machine to excessive heat.  She is motivated to continue using BiPAP as she feels much less tired when she is using it consistently.  Compliance report dated 09/12/2019 through 10/11/2019 reveals that she used BiPAP therapy six of the past 30 days for compliance of 20%.  She used CPAP greater than 4 hours three of the past 30 days for compliance of 10%.  Residual AHI was 2.9 with a EPAP of 18 cm of water and IPAP of 21 cm of water.  There was no leak noted.  HISTORY: (copied from my note  on 02/10/2019)  UA negativeMary H Acosta is a 66 y.o. female here today for follow up for OSA on BiPAP.  Four hour usage remains sub optimal. She continues to work on meeting 4 hour goal. She admits that sometimes she falls asleep without placing BiPAP and sometimes she pulls her mask off during the night. Otherwise, she is doing very well. She notes benefit of BiPAP therapy.    Compliance report dated 01/10/2019 through 02/08/2019 reveals that she used BiPAP 28 of the last 30 days compliance of 93%.  She used BiPAP greater than 4 hours 57% of the time.  Average usage was 3 hours and 59 minutes.  Residual AHI was 5.4 with IPAP of 21 cm of water and EPAP of 18 cm of water. No significant leak noted.    HISTORY: (copied from my note on 08/09/2018)   Holly Acosta is a 66 y.o. female here today for follow up of OSA on BiPAP.  She is doing well with therapy.  She is using her machine every night.  She reports that she continues to struggle with using it for more than 4 hours each night.  She does feel that this is improving with time.  She denies any concerns with mask.  Compliance download dated 07/06/2018 through 08/04/2018 reveals that she is  using her machine 29 of the last 30 days.  On average she used her machine 3 hours and 34 minutes.  AHI was 6.1 with EPAP of 18 cm of water and IPAP of 21 cm of water.  There was no significant leak.  She does note significant improvement in her fatigue.   History (copied from my note on 04/29/2018)   Holly Acosta is a 66 y.o. female for follow up of OSA on BiPAP. She she reports that she is doing well with her BiPAP machine.  She definitely feels that it helps with sleep and her energy the next day.  Download report reveals that she is used her machine 27 out of the last 30 days for compliance of 90%.  63% compliance with use greater than 4 hours.  She averages about 4 hours and 53 minutes.  AHI is 8.1 with EPAP at 18 cm of water and IPAP at 21 cm of water.   History  (copied from Dr Dohmeier's note on 12/03/2017)   HPI:  Holly Acosta is a 66 y.o. female patient and seen  on 12-03-2017 upon referral for a sleep evaluation.  Holly Acosta is a [facility] patient with the resident clinic, she was admitted to [facility] on 27 April 2017 for a cardiac infarct status post stent placement and she has been using aspirin and Brilinta.  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  Half function at the time was an ejection fraction of 55 to 60% with multi regional hypokinesis.  The patient has known diabetes which has been poorly controlled at the time her HbA1c was 13.3.  Additional risk factors for stroke and heart disease are hyperlipidemia, obesity and untreated obstructive sleep apnea.  The patient was seen by Dr. Cari Caraway and then by Dr. Leonie Man on 06 October 2017 HbA1c was now 9.3, still very elevated but improved.  She has a little bit of bruising and bleeding from Brilinta in combination with aspirin.  She is using a walker rehab had been delayed due to transportation problems.  She takes Crestor and her blood pressure has been controlled.    Her previous sleep study 9/20/ 2011 was ordered through Drs. Mulberry and MacPherson, it revealed severe OSA at an AHI of 48/h. CPAP therapy was recommnended. and sleep evaluations were obtained through the [facility] lab. The machine is 66 years old and now  broken. She can't remember who followed her in the [facility] sleep medicine clinic. The study was read by Dr. Baird Lyons.      Chief complaint according to patient :" I may have to go back on CPAP - but I sleep fairly good" and " I easily fall asleep when I am inactive and not stimulated".   Sleep  habits are as follows:Donner time is around 7 Pm, and she goes to bed most nights between 10 and 11 PM. Has a seperate bedroom from her living room, and retreats at that time. The bedroom is kept cool, quiet and dark. She sleeps on her back , some times on her left side. She needs 2 pillows and likes the head of bed elevated, sleeps with a fan in the background. She is not dreaming much,  she stated.  Reports only one bathroom break around 4-5 AM. Wakes sponatanously between 6 and 7 AM , and she estimates 7-8 hours of sleep each night.  She wakes up refreshed. No headaches in AM- but sometimes with a dry mouth.    She naps in daytime, unscheduled. She lives with 2 granddaughters, who reported her snoring loudly and witnessed her having sleep apnea.      Medical history : already in record, reviewed.    Family history : Sister is only family member with sleep apnea.     Social history: her 2 granddaughters live at her house , she is divorced. Disability due to knee osteoarthritis, she became disabled 2015 and has yet not received hip or knee surgery . She quit smoking over 20 years ago, but one granddaughter smokes in the house. ETOH- none, caffeine - she drinks coffee in AM , iced tea for lunch and dinner    REVIEW OF SYSTEMS: Out of a complete 14 system review of symptoms, the patient complains only of the following symptoms, fatigue, imbalance, and all other reviewed systems are negative.  ESS: 12   ALLERGIES: Allergies  Allergen Reactions   Lipitor [Atorvastatin] Other (See Comments)    Cramping in feet that resolved after stopping med   Other Nausea And Vomiting and Other (See Comments)    GLP-1: Trulicity, Ozempic, Victoza - vomiting and cramping    Diclofenac Nausea Only and Other (See Comments)    Caused a lot of stomach pain    Trulicity [Dulaglutide] Nausea And Vomiting and Other (See Comments)    GLP-1: Trulicity, Ozempic, Victoza - vomiting and cramping     HOME  MEDICATIONS: Outpatient Medications Prior to Visit  Medication Sig Dispense Refill   ACCU-CHEK GUIDE test strip TEST BLOOD SUGAR UP TO 3 TIMES A DAY 300 strip 11   ACCU-CHEK FASTCLIX LANCETS MISC 1 each by Does not apply route daily. Check sugar daily as needed 102 each 3   acetaminophen (TYLENOL) 500 MG tablet Take 1,000 mg by mouth every 12 (twelve) hours as needed for mild pain.     amLODipine (NORVASC) 5 MG tablet TAKE 1 TABLET BY MOUTH EVERY DAY 90 tablet 3   aspirin EC 81 MG tablet Take 1 tablet (81 mg total) by mouth daily. 90 tablet 3   B-D UF III MINI PEN NEEDLES 31G X 5 MM MISC USE AS DIRECTED 3 TIMES A DAY 100 each 2   carvedilol (COREG) 12.5 MG tablet TAKE 1 TABLET BY MOUTH 2 TIMES DAILY WITH A MEAL. 180 tablet 3   dapagliflozin propanediol (FARXIGA) 10 MG TABS tablet Take 1 tablet (10 mg total) by mouth daily. 90 tablet 3   ezetimibe (ZETIA) 10 MG tablet Take 1 tablet (10 mg total) by mouth daily. 90 tablet 3   HUMALOG KWIKPEN 100 UNIT/ML KwikPen INJECT 14 UNITS INTO THE SKIN 3 (THREE) TIMES DAILY. 3 mL 1   LANTUS SOLOSTAR 100 UNIT/ML Solostar Pen INJECT 32 UNITS INTO SKIN DAILY 15 mL 11   lisinopril (ZESTRIL) 10 MG tablet Take 1 tablet (10 mg total) by mouth daily. 30 tablet 11   metFORMIN (GLUCOPHAGE) 1000 MG tablet Take 1 tablet (1,000 mg total) by mouth 2 (two) times daily with a meal. 180 tablet 3   Omega-3 Fatty Acids (FISH OIL) 1000 MG CAPS Take 1 mg by mouth in the morning and at bedtime.     rosuvastatin (CRESTOR) 40 MG tablet TAKE 1 TABLET BY MOUTH EVERY DAY  90 tablet 3   No facility-administered medications prior to visit.    PAST MEDICAL HISTORY: Past Medical History:  Diagnosis Date   Acute cerebrovascular accident (CVA) of cerebellum ([place]) 04/29/2017   Acute cerebrovascular accident (CVA) of cerebellum ([place]) 04/29/2017   Acute respiratory failure with hypoxia (HCC)    AKI (acute kidney injury) ([place])    Anemia    Arthritis of knee    "both knees" (04/24/2017)    Cerebrovascular accident (CVA) due to occlusion of left posterior cerebral artery ([place]) 12/03/2017   Chest pain 04/24/2017   COLONIC POLYPS, HYPERPLASTIC 09/13/2009   Qualifier: Diagnosis of  By: Amil Amen MD, Elizabeth     Constipation 06/02/2017   Coronary artery disease involving native coronary artery of native heart with unstable angina pectoris ([place]) 04/30/2017   Dehydration    Diabetes mellitus, type 2 ([place])    History of blood transfusion    "related to prolapsed uterus"   HLD (hyperlipidemia)    HTN (hypertension)    Non-ST elevation (NSTEMI) myocardial infarction (HCC)    Non-ST elevation (NSTEMI) myocardial infarction ([place])    Pneumonia due to COVID-19 virus 02/18/2019   Prolapsed uterus    Severe obstructive sleep apnea-hypopnea syndrome 01/01/2018   Sleep apnea    "have mask; haven't worn it for a long time" (04/24/2017)   Status post coronary artery stent placement     PAST SURGICAL HISTORY: Past Surgical History:  Procedure Laterality Date   CARDIAC CATHETERIZATION  2011   CORONARY STENT INTERVENTION N/A 04/27/2017   Procedure: CORONARY STENT INTERVENTION;  Surgeon: Jettie Booze, MD;  Location: [facility];  Service: Cardiovascular;  Laterality: N/A;   LEFT HEART CATH AND CORONARY ANGIOGRAPHY N/A 04/27/2017   Procedure: LEFT HEART CATH AND CORONARY ANGIOGRAPHY;  Surgeon: Jettie Booze, MD;  Location: [facility];  Service: Cardiovascular;  Laterality: N/A;   TUBAL LIGATION      FAMILY HISTORY: Family History  Problem Relation Age of Onset   Diabetes Mother    Hypertension Mother    Hypertension Father     SOCIAL HISTORY: Social History   Socioeconomic History   Marital status: Divorced    Spouse name: Not on file   Number of children: Not on file   Years of education: Not on file   Highest education level: Not on file  Occupational History   Not on file  Tobacco Use   Smoking status: Former    Packs/day: 1.00    Years: 33.00     Pack years: 33.00    Types: Cigarettes    Start date: 01/20/1973    Quit date: 11/04/2006    Years since quitting: 14.3   Smokeless tobacco: Never  Vaping Use   Vaping Use: Never used  Substance and Sexual Activity   Alcohol use: Never    Alcohol/week: 0.0 standard drinks   Drug use: Never   Sexual activity: Not Currently  Other Topics Concern   Not on file  Social History Narrative   Lives with daughter [name] age 70) and 4 grandchildren.   Disability - Arthritis. Does get disability check.   Does not have a living will yet   Social Determinants of Radio broadcast assistant Strain: Not on file  Food Insecurity: Not on file  Transportation Needs: Not on file  Physical Activity: Not on file  Stress: Not on file  Social Connections: Not on file  Intimate Partner Violence: Not on file  PHYSICAL EXAM  There were no vitals filed for this visit.  There is no height or weight on file to calculate BMI.  Generalized: Well developed, in no acute distress  Cardiology: normal rate and rhythm, no murmur noted Respiratory: clear to auscultation bilaterally  Neurological examination  Mentation: Alert oriented to time, place, history taking. Follows all commands speech and language fluent Cranial nerve II-XII: Pupils were equal round reactive to light. Extraocular movements were full, visual field were full  Motor: The motor testing reveals 5 over 5 strength of all 4 extremities. Good symmetric motor tone is noted throughout.  Gait and station: Gait is stable with Rolator   DIAGNOSTIC DATA (LABS, IMAGING, TESTING) - I reviewed patient records, labs, notes, testing and imaging myself where available.  No flowsheet data found.   Lab Results  Component Value Date   WBC 10.2 03/01/2019   HGB 11.8 03/01/2019   HCT 37.0 03/01/2019   MCV 87 03/01/2019   PLT 350 03/01/2019      Component Value Date/Time   NA 136 03/06/2020 0915   K 4.5 03/06/2020 0915   CL 99 03/06/2020  0915   CO2 21 03/06/2020 0915   GLUCOSE 241 (H) 03/06/2020 0915   GLUCOSE 286 (H) 02/19/2019 0500   BUN 15 03/06/2020 0915   CREATININE 1.11 (H) 03/06/2020 0915   CREATININE 0.93 02/01/2015 1640   CALCIUM 10.0 03/06/2020 0915   PROT 5.2 (L) 03/01/2019 1742   ALBUMIN 3.1 (L) 03/01/2019 1742   AST 12 03/01/2019 1742   ALT 21 03/01/2019 1742   ALKPHOS 162 (H) 03/01/2019 1742   BILITOT 0.2 03/01/2019 1742   GFRNONAA 53 (L) 03/06/2020 0915   GFRNONAA 67 02/01/2015 1640   GFRAA 61 03/06/2020 0915   GFRAA 78 02/01/2015 1640   Lab Results  Component Value Date   CHOL 266 (H) 03/06/2020   HDL 35 (L) 03/06/2020   LDLCALC 155 (H) 03/06/2020   LDLDIRECT 135 (H) 12/31/2012   TRIG 403 (H) 03/06/2020   CHOLHDL 7.6 (H) 03/06/2020   Lab Results  Component Value Date   HGBA1C 11.8 (A) 10/09/2020   Lab Results  Component Value Date   VITAMINB12 281 03/01/2019   Lab Results  Component Value Date   TSH 0.422 04/28/2017       ASSESSMENT AND PLAN 66 y.o. year old female  has a past medical history of Acute cerebrovascular accident (CVA) of cerebellum ([place]) (04/29/2017), Acute cerebrovascular accident (CVA) of cerebellum ([employer]) (04/29/2017), Acute respiratory failure with hypoxia ([place]), AKI (acute kidney injury) ([place]), Anemia, Arthritis of knee, Cerebrovascular accident (CVA) due to occlusion of left posterior cerebral artery ([place]) (12/03/2017), Chest pain (04/24/2017), COLONIC POLYPS, HYPERPLASTIC (09/13/2009), Constipation (06/02/2017), Coronary artery disease involving native coronary artery of native heart with unstable angina pectoris ([place]) (04/30/2017), Dehydration, Diabetes mellitus, type 2 ([place]), History of blood transfusion, HLD (hyperlipidemia), HTN (hypertension), Non-ST elevation (NSTEMI) myocardial infarction [facility]), Non-ST elevation (NSTEMI) myocardial infarction ([place]), Pneumonia due to COVID-19 virus (02/18/2019), Prolapsed uterus, Severe obstructive sleep apnea-hypopnea syndrome  (01/01/2018), Sleep apnea, and Status post coronary artery stent placement. here with   No diagnosis found.    [place] reports that she is doing well today.  She has resumed BiPAP therapy and reports that she is feeling better.  She is less tired throughout the day.  Daily compliance is excellent, however, 4-hour compliance is suboptimal.  Residual AHI is 5.7.  She was encouraged to use BiPAP nightly and for greater than 4 hours each night.  I have advised her to reach out to her DME company for replacement mask.  She will monitor for concerns of continued leak.  I will reprint download in 4 to 6 weeks to review AHI and leak.  She will continue follow-up with primary care as directed.  Healthy lifestyle habits encouraged.  I will have her follow-up in 1 year, sooner if needed.  She verbalizes understanding and agreement with this plan.   No orders of the defined types were placed in this encounter.    No orders of the defined types were placed in this encounter.     Debbora Presto, FNP-C 02/22/2021, 9:04 AM [facility] [address] [place],  [address] [phone]

## 2021-02-22 NOTE — Patient Instructions (Incomplete)
Please continue using your BiPAP regularly. While your insurance requires that you use BiPAP at least 4 hours each night on 70% of the nights, I recommend, that you not skip any nights and use it throughout the night if you can. Getting used to BiPAP and staying with the treatment long term does take time and patience and discipline. Untreated obstructive sleep apnea when it is moderate to severe can have an adverse impact on cardiovascular health and raise her risk for heart disease, arrhythmias, hypertension, congestive heart failure, stroke and diabetes. Untreated obstructive sleep apnea causes sleep disruption, nonrestorative sleep, and sleep deprivation. This can have an impact on your day to day functioning and cause daytime sleepiness and impairment of cognitive function, memory loss, mood disturbance, and problems focussing. Using BiPAP regularly can improve these symptoms.   Follow up in 1 year, sooner if you wish to start process of getting a new BiPAP machine  

## 2021-02-25 ENCOUNTER — Ambulatory Visit: Payer: Medicaid Other | Admitting: Family Medicine

## 2021-02-25 DIAGNOSIS — G4733 Obstructive sleep apnea (adult) (pediatric): Secondary | ICD-10-CM

## 2021-03-18 ENCOUNTER — Other Ambulatory Visit: Payer: Self-pay

## 2021-03-19 MED ORDER — METFORMIN HCL 1000 MG PO TABS: 1000.0000 mg | ORAL_TABLET | Freq: Two times a day (BID) | ORAL | 3 refills | Status: DC

## 2021-03-25 ENCOUNTER — Other Ambulatory Visit: Payer: Self-pay

## 2021-03-25 DIAGNOSIS — E1165 Type 2 diabetes mellitus with hyperglycemia: Secondary | ICD-10-CM

## 2021-03-25 MED ORDER — DAPAGLIFLOZIN PROPANEDIOL 10 MG PO TABS: 10.0000 mg | ORAL_TABLET | Freq: Every day | ORAL | 3 refills | Status: DC

## 2021-05-04 ENCOUNTER — Other Ambulatory Visit: Payer: Self-pay | Admitting: Family Medicine

## 2021-05-04 DIAGNOSIS — E1169 Type 2 diabetes mellitus with other specified complication: Secondary | ICD-10-CM

## 2021-05-06 ENCOUNTER — Other Ambulatory Visit: Payer: Self-pay

## 2021-05-06 DIAGNOSIS — E1165 Type 2 diabetes mellitus with hyperglycemia: Secondary | ICD-10-CM

## 2021-05-06 MED ORDER — LISINOPRIL 10 MG PO TABS: 10.0000 mg | ORAL_TABLET | Freq: Every day | ORAL | 11 refills | Status: DC

## 2021-05-21 ENCOUNTER — Other Ambulatory Visit: Payer: Self-pay | Admitting: Family Medicine

## 2021-05-22 ENCOUNTER — Other Ambulatory Visit: Payer: Self-pay

## 2021-05-22 DIAGNOSIS — E1165 Type 2 diabetes mellitus with hyperglycemia: Secondary | ICD-10-CM

## 2021-05-22 MED ORDER — LANTUS SOLOSTAR 100 UNIT/ML ~~LOC~~ SOPN: PEN_INJECTOR | SUBCUTANEOUS | 11 refills | Status: DC

## 2021-05-22 NOTE — Progress Notes (Signed)
? ? ?PATIENT: Holly Acosta ?DOB: 31-Dec-1955 ? ?REASON FOR VISIT: follow up ?HISTORY FROM: patient ? ?Chief Complaint  ?Patient presents with  ? Obstructive Sleep Apnea  ?  Rm 6, alone. Here for yearly BiPAP f/u. Pt has missed a little bit over a month of usage due to having cataract surgery in both eyes. Mask tends to slip off and the air would blow into her eyes.   ?  ? ?HISTORY OF PRESENT ILLNESS: ? ?05/23/21 ALL: ?Holly Acosta returns for follow up for OSA on BiPAP. We last saw her 01/2020 and she had resumed therapy. Four hour compliance was sub optimal. Repeat download in 04/2020 showed improvement and she was encouraged to continue working toward compliance goals. She has been lost to follow up. She reports that she has continued to use CPAP regularly. She did have a break in usage for a couple of weeks in March following cataract surgery but has resumed therapy and doing well. She has used every night for the past 3.5 weeks. She does note a leak in her FFM. She feels if she tightens mask, it hurts bridge of nose.  ? ? ? ?02/01/2020 ALL:  ?She is doing well. She has resumed BiPAP therapy and is doing well. She denies difficulty with her machine or supplies. She has not heard anything regarding recall. She does note some air leaking from her mask but may need a replacement.  ? ?Compliance report dated 01/01/2020 through 01/30/2020 reveals that she used CPAP 29 of the past 30 days for compliance of 96.7%.  She used CPAP greater than 4 hours 66.7% of the time.  Average usage on days used was 4 hours and 21 minutes.  Residual AHI was 5.7 with a EPAP of 18 and IPAP of 21.  Average time in large leak per day was 1 hour and 39 minutes. ? ? ?History (copied from previous notes) ?10/26/2019 ?Holly Acosta is a 66 y.o. female here today for follow up for OSA on BiPAP. She was admitted to the hospital 01/2019 for PNA in setting of Covid.  She reports that she is recovered nicely.  She continues to follow-up very closely with primary  care.  Diabetes has not been well controlled recently but she is working closely with her providers.  She has not used BiPAP consistently.  She has been concerned about the recall.  She thinks that someone told her her machine was not effective.  She does not use an external cleanser.  She does not expose BiPAP machine to excessive heat.  She is motivated to continue using BiPAP as she feels much less tired when she is using it consistently. ? ?Compliance report dated 09/12/2019 through 10/11/2019 reveals that she used BiPAP therapy six of the past 30 days for compliance of 20%.  She used CPAP greater than 4 hours three of the past 30 days for compliance of 10%.  Residual AHI was 2.9 with a EPAP of 18 cm of water and IPAP of 21 cm of water.  There was no leak noted. ? ?HISTORY: (copied from my note on 02/10/2019) ? ?UA negativeMary H Acosta is a 66 y.o. female here today for follow up for OSA on BiPAP.  Four hour usage remains sub optimal. She continues to work on meeting 4 hour goal. She admits that sometimes she falls asleep without placing BiPAP and sometimes she pulls her mask off during the night. Otherwise, she is doing very well. She notes benefit of BiPAP therapy.  ?  ?  Compliance report dated 01/10/2019 through 02/08/2019 reveals that she used BiPAP 28 of the last 30 days compliance of 93%.  She used BiPAP greater than 4 hours 57% of the time.  Average usage was 3 hours and 59 minutes.  Residual AHI was 5.4 with IPAP of 21 cm of water and EPAP of 18 cm of water. No significant leak noted.  ?  ?HISTORY: (copied from my note on 08/09/2018) ?  ?Holly Acosta is a 66 y.o. female here today for follow up of OSA on BiPAP.  She is doing well with therapy.  She is using her machine every night.  She reports that she continues to struggle with using it for more than 4 hours each night.  She does feel that this is improving with time.  She denies any concerns with mask.  Compliance download dated 07/06/2018 through  08/04/2018 reveals that she is using her machine 29 of the last 30 days.  On average she used her machine 3 hours and 34 minutes.  AHI was 6.1 with EPAP of 18 cm of water and IPAP of 21 cm of water.  There was no significant leak.  She does note significant improvement in her fatigue. ?  ?History (copied from my note on 04/29/2018) ?  ?Holly Acosta is a 66 y.o. female for follow up of OSA on BiPAP. She she reports that she is doing well with her BiPAP machine.  She definitely feels that it helps with sleep and her energy the next day.  Download report reveals that she is used her machine 27 out of the last 30 days for compliance of 90%.  63% compliance with use greater than 4 hours.  She averages about 4 hours and 53 minutes.  AHI is 8.1 with EPAP at 18 cm of water and IPAP at 21 cm of water. ?  ?History (copied from Dr Dohmeier's note on 12/03/2017) ?  ?HPI:  Holly Acosta is a 66 y.o. female patient and seen  on 12-03-2017 upon referral for a sleep evaluation.  ?[name] is a [facility] patient with the resident clinic, she was admitted to [facility] on 27 April 2017 for a cardiac infarct status post stent placement and she has been using aspirin and Brilinta.  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  Half function at the time was an ejection fraction of 55 to 60% with multi regional hypokinesis.  The patient has known diabetes which has been poorly controlled at the time her HbA1c was 13.3.  Additional risk factors for stroke and heart disease are hyperlipidemia, obesity and untreated obstructive sleep apnea.  The patient was seen by Dr. Cari Caraway and then by Dr. Leonie Man on 06 October 2017 HbA1c was now 9.3, still very elevated but  improved.  She has a little bit of bruising and bleeding from Brilinta in combination with aspirin.  She is using a walker rehab had been delayed due to transportation problems.  She takes Crestor and her blood pressure has been controlled.  ?  ?Her previous sleep study 9/20/ 2011 was ordered through Drs. Mulberry and MacPherson, it revealed severe OSA at an AHI of 48/h. CPAP therapy was recommnended. and sleep evaluations were obtained  through the [facility]. The machine is 66 years old and now  broken. She can't remember who followed her in the [facility] sleep [facility] clinic. The study was read by Dr. Baird Lyons.  ?  ?  ?Chief complaint according to patient :" I may have to go back on CPAP - but I sleep fairly good" and " I easily fall asleep when I am inactive and not stimulated". ?  ?Sleep habits are as follows:Donner time is around 7 Pm, and she goes to bed most nights between 10 and 11 PM. Has a seperate bedroom from her living room, and retreats at that time. The bedroom is kept cool, quiet and dark. She sleeps on her back , some times on her left side. She needs 2 pillows and likes the head of bed elevated, sleeps with a fan in the background. She is not dreaming much, she stated.  Reports only one bathroom break around 4-5 AM. Wakes sponatanously between 6 and 7 AM , and she estimates 7-8 hours of sleep each night.  ?She wakes up refreshed. No headaches in AM- but sometimes with a dry mouth.    ?She naps in daytime, unscheduled. She lives with 2 granddaughters, who reported her snoring loudly and witnessed her having sleep apnea.  ?  ?  ?Medical history : already in record, reviewed.  ?  ?Family history : Sister is only family member with sleep apnea. ?  ?  ?Social history: her 2 granddaughters live at her house , she is divorced. Disability due to knee osteoarthritis, she became disabled 2015 and has yet not received hip or knee surgery . She quit smoking over 20 years ago, but one granddaughter  smokes in the house. ETOH- none, caffeine - she drinks coffee in AM , iced tea for lunch and dinner ? ? ? ?REVIEW OF SYSTEMS: Out of a complete 14 system review of symptoms, the patient complains only of the foll

## 2021-05-22 NOTE — Patient Instructions (Addendum)
Please continue using your BiPAP regularly. While your insurance requires that you use BiPAP at least 4 hours each night on 70% of the nights, I recommend, that you not skip any nights and use it throughout the night if you can. Getting used to BiPAP and staying with the treatment long term does take time and patience and discipline. Untreated obstructive sleep apnea when it is moderate to severe can have an adverse impact on cardiovascular health and raise her risk for heart disease, arrhythmias, hypertension, congestive heart failure, stroke and diabetes. Untreated obstructive sleep apnea causes sleep disruption, nonrestorative sleep, and sleep deprivation. This can have an impact on your day to day functioning and cause daytime sleepiness and impairment of cognitive function, memory loss, mood disturbance, and problems focussing. Using BiPAP regularly can improve these symptoms. ? ?I am going to sen orders for a mask refitting due to your air leak. Try to keep mask snug when you start therapy at night.  ? ?Follow up with me in 6-4 months  ? ?

## 2021-05-23 ENCOUNTER — Encounter: Payer: Self-pay | Admitting: Family Medicine

## 2021-05-23 ENCOUNTER — Ambulatory Visit (INDEPENDENT_AMBULATORY_CARE_PROVIDER_SITE_OTHER): Payer: Medicare Other | Admitting: Family Medicine

## 2021-05-23 VITALS — BP 132/70 | HR 68 | Ht 65.0 in | Wt 178.5 lb

## 2021-05-23 DIAGNOSIS — G4733 Obstructive sleep apnea (adult) (pediatric): Secondary | ICD-10-CM | POA: Diagnosis not present

## 2021-05-23 NOTE — Progress Notes (Signed)
CM sent to [facility] for new order ?

## 2021-07-05 ENCOUNTER — Other Ambulatory Visit: Payer: Self-pay | Admitting: Family Medicine

## 2021-07-17 ENCOUNTER — Telehealth: Payer: Self-pay | Admitting: Cardiology

## 2021-07-17 ENCOUNTER — Other Ambulatory Visit: Payer: Self-pay | Admitting: Cardiology

## 2021-07-17 ENCOUNTER — Other Ambulatory Visit: Payer: Self-pay

## 2021-07-17 NOTE — Telephone Encounter (Signed)
Called patient to advise medication Refill sent to pahrmacy.

## 2021-07-17 NOTE — Telephone Encounter (Signed)
*  STAT* If patient is at the pharmacy, call can be transferred to refill team.   1. Which medications need to be refilled? (please list name of each medication and dose if known) amLODipine (NORVASC) 5 MG tablet  2. Which pharmacy/location (including street and city if local pharmacy) is medication to be sent to? [id number] [place], [place] - [address]. Do they need a 30 day or 90 day supply? 955 Pt made an appt for 9/19 wit Dr. JMartinique

## 2021-08-13 ENCOUNTER — Other Ambulatory Visit: Payer: Self-pay

## 2021-08-13 ENCOUNTER — Other Ambulatory Visit: Payer: Self-pay | Admitting: Family Medicine

## 2021-08-13 DIAGNOSIS — E1165 Type 2 diabetes mellitus with hyperglycemia: Secondary | ICD-10-CM

## 2021-08-13 MED ORDER — ACCU-CHEK GUIDE VI STRP: ORAL_STRIP | 11 refills | Status: DC

## 2021-08-26 ENCOUNTER — Ambulatory Visit: Payer: Medicare Other | Admitting: Family Medicine

## 2021-08-30 ENCOUNTER — Ambulatory Visit: Payer: Medicare Other | Admitting: Family Medicine

## 2021-09-06 ENCOUNTER — Encounter: Payer: Self-pay | Admitting: Family Medicine

## 2021-09-06 ENCOUNTER — Ambulatory Visit (INDEPENDENT_AMBULATORY_CARE_PROVIDER_SITE_OTHER): Payer: Medicare Other | Admitting: Family Medicine

## 2021-09-06 VITALS — BP 124/68 | HR 68 | Wt 171.0 lb

## 2021-09-06 DIAGNOSIS — E1165 Type 2 diabetes mellitus with hyperglycemia: Secondary | ICD-10-CM | POA: Diagnosis not present

## 2021-09-06 LAB — POCT GLYCOSYLATED HEMOGLOBIN (HGB A1C): HbA1c, POC (controlled diabetic range): 13 % — AB (ref 0.0–7.0)

## 2021-09-06 NOTE — Patient Instructions (Addendum)
It was great seeing you today!  Today we discussed your diabetes, your A1c is 13 which means that your diabetes is not currently controlled. Please make sure to bring all your medications to your next visit. Please continue taking all your medications as prescribed. Please make sure to check your sugar level first thing in the morning and record this. If this value is greater than 150 then increase your lantus dose by 1. Continue to do this until your sugar is 150 or less, this is the number you of lantus units you will take daily. Please continue to tale humalog with your meals which will help control your sugars during mealtime.   If you feel dizzy or weak then please call me immediately.   Please follow up at your next scheduled appointment in 2 weeks, if anything arises between now and then, please don't hesitate to contact our office.   Thank you for allowing [place] to be a part of your medical care!  Thank you, Dr. Larae Grooms

## 2021-09-06 NOTE — Assessment & Plan Note (Addendum)
-  A1c 13 today, uncontrolled -continue diabetes regimen but instructed to check glucose level in the morning prior to breakfast and increase lantus by 1 each day for any glucose level >150. Discussed the importance of med compliance. Patient agrees and voices understanding.  -diet and exercise counseling provided -discussed importance of annual ophthalmology visits, especially given concern for uncontrolled DM -instructed to bring all meds into her next visit and record glucose levels 4 times a day (morning for fasting and mealtime). Will determine at the next visit if mealtime coverage should be adjusted as well  -strict return precautions discussed if she gets dizzy or weak -plan to follow up in 2 weeks for lipid panel and other blood work along with for DM follow up

## 2021-09-06 NOTE — Assessment & Plan Note (Signed)
>>  ASSESSMENT AND PLAN FOR UNCONTROLLED TYPE 2 DIABETES MELLITUS WITH HYPERGLYCEMIA (HCC) WRITTEN ON 09/06/2021 12:46 PM BY GANTA, ANUPA, DO  -A1c 13 today, uncontrolled -continue diabetes regimen but instructed to check glucose level in the morning prior to breakfast and increase lantus by 1 each day for any glucose level >150. Discussed the importance of med compliance. Patient agrees and voices understanding.  -diet and exercise counseling provided -discussed importance of annual ophthalmology visits, especially given concern for uncontrolled DM -instructed to bring all meds into her next visit and record glucose levels 4 times a day (morning for fasting and mealtime). Will determine at the next visit if mealtime coverage should be adjusted as well  -strict return precautions discussed if she gets dizzy or weak -plan to follow up in 2 weeks for lipid panel and other blood work along with for DM follow up

## 2021-09-06 NOTE — Progress Notes (Signed)
    SUBJECTIVE:   CHIEF COMPLAINT / HPI:   Patient presents for DM check, she had blood work done at home last month and noticed that her A1c was greater than 13 so she wanted to come in to get her DM under control. Diabetic regimen includes lantus 32 units daily, humalog 14 units three times day, farxiga 10 mg daily and metformin 1000 mg bid. She sometimes does not take her medications that need to be multiple times a day, sometimes takes humalog only once a day and other times twice a day. Takes metformin twice a day without issues. Her glucose levels at home are high 200s fasting at the most but normal range is low 200s. Denies any hypoglycemic episodes. Denies chest pain, dyspnea, vision changes and headaches. She has not been checking her sugars regularly since she has been tired. Last went to the ophthalmologist within this past year.   OBJECTIVE:   BP 124/68   Pulse 68   Wt 171 lb (77.6 kg)   SpO2 97%   BMI 28.46 kg/m   General: Patient well-appearing, in no acute distress. CV: RRR, no murmurs or gallops auscultated Resp: CTAB, no wheezing, rales or rhonchi noted Ext: No LE edema noted bilaterally  Neuro: ambulates with assistance of walker  ASSESSMENT/PLAN:   Uncontrolled type 2 diabetes mellitus with hyperglycemia (HCC) -A1c 13 today, uncontrolled -continue diabetes regimen but instructed to check glucose level in the morning prior to breakfast and increase lantus by 1 each day for any glucose level >150. Discussed the importance of med compliance. Patient agrees and voices understanding.  -diet and exercise counseling provided -discussed importance of annual ophthalmology visits, especially given concern for uncontrolled DM -instructed to bring all meds into her next visit and record glucose levels 4 times a day (morning for fasting and mealtime). Will determine at the next visit if mealtime coverage should be adjusted as well  -strict return precautions discussed if she gets  dizzy or weak -plan to follow up in 2 weeks for lipid panel and other blood work along with for DM follow up     -PHQ-9 score of 7 with negative question 9 reviewed.   Donney Dice, [place]

## 2021-09-09 ENCOUNTER — Other Ambulatory Visit: Payer: Self-pay

## 2021-09-09 DIAGNOSIS — E1165 Type 2 diabetes mellitus with hyperglycemia: Secondary | ICD-10-CM

## 2021-09-09 MED ORDER — ACCU-CHEK GUIDE VI STRP: ORAL_STRIP | 11 refills | Status: DC

## 2021-09-09 MED ORDER — ACCU-CHEK FASTCLIX LANCETS MISC: 1.0000 | Freq: Every day | 3 refills | Status: DC

## 2021-09-09 MED ORDER — ACCU-CHEK GUIDE W/DEVICE KIT: PACK | 0 refills | Status: DC

## 2021-09-13 ENCOUNTER — Other Ambulatory Visit: Payer: Self-pay | Admitting: Family Medicine

## 2021-09-18 ENCOUNTER — Ambulatory Visit: Payer: Medicare Other | Admitting: Family Medicine

## 2021-09-30 ENCOUNTER — Telehealth: Payer: Self-pay

## 2021-09-30 NOTE — Patient Outreach (Signed)
  Care Coordination   09/30/2021 Name: Holly Acosta MRN: [id number] DOB: September 14, 1955   Care Coordination Outreach Attempts:  An unsuccessful telephone outreach was attempted today to offer the patient information about available care coordination services as a benefit of their health plan.  Patient hung up after briefly talking with care manager.   Follow Up Plan:  Additional outreach attempts will be made to offer the patient care coordination information and services.   Encounter Outcome:  No Answer  Care Coordination Interventions Activated:  No   Care Coordination Interventions:  No, not indicated    Jone Baseman, RN, MSN [facility] Care Management Care Management Coordinator Direct Line [phone] Toll Free: [phone]  Fax: [phone]

## 2021-10-07 ENCOUNTER — Telehealth: Payer: Self-pay

## 2021-10-07 NOTE — Patient Outreach (Signed)
  Care Coordination   10/07/2021 Name: Holly Acosta MRN: [id number] DOB: 1955-10-30   Care Coordination Outreach Attempts:  An unsuccessful telephone outreach was attempted today to offer the patient information about available care coordination services as a benefit of their health plan.  Patient answered and verified HIPAA and then hung up again.  Follow Up Plan:  Additional outreach attempts will be made to offer the patient care coordination information and services.   Encounter Outcome:  No Answer  Care Coordination Interventions Activated:  No   Care Coordination Interventions:  No, not indicated    Jone Baseman, RN, MSN [facility] Care Management Care Management Coordinator Direct Line [phone]

## 2021-10-08 ENCOUNTER — Ambulatory Visit: Payer: Medicare Other | Admitting: Cardiology

## 2021-10-15 ENCOUNTER — Telehealth: Payer: Self-pay

## 2021-10-15 NOTE — Patient Outreach (Signed)
  Care Coordination   Initial Visit Note   10/15/2021 Name: Holly Acosta MRN: [id number] DOB: [birth year]  Holly Acosta is a 66 y.o. year old female who sees Niue, Anupa, DO for primary care. I spoke with  Holly Acosta by phone today.  What matters to the patients health and wellness today?  Diabetes management    Goals Addressed             This Visit's Progress    Diabetes Management       Care Coordination Interventions: Provided education to patient about basic DM disease process Reviewed medications with patient and discussed importance of medication adherence Discussed plans with patient for ongoing care management follow up and provided patient with direct contact information for care management team          SDOH assessments and interventions completed:  Yes     Care Coordination Interventions Activated:  Yes  Care Coordination Interventions:  Yes, provided   Follow up plan: Follow up call scheduled for October 29, 2021    Encounter Outcome:  Pt. Visit Completed   Jone Baseman, RN, MSN [facility] Care Management Coordinator Direct Line [phone]

## 2021-10-15 NOTE — Patient Instructions (Signed)
Visit Information  Thank you for taking time to visit with me today. Please don't hesitate to contact me if I can be of assistance to you.   Following are the goals we discussed today:   Goals Addressed             This Visit's Progress    Diabetes Management       Care Coordination Interventions: Provided education to patient about basic DM disease process Reviewed medications with patient and discussed importance of medication adherence Discussed plans with patient for ongoing care management follow up and provided patient with direct contact information for care management team          Our next appointment is by telephone on 10/29/21 at 9:15 am  Please call the care guide team at [phone] if you need to cancel or reschedule your appointment.   If you are experiencing a Mental Health or [place] or need someone to talk to, please call the Suicide and Crisis Lifeline: 988   The patient verbalized understanding of instructions, educational materials, and care plan provided today and agreed to receive a mailed copy of patient instructions, educational materials, and care plan.   Telephone follow up appointment with care management team member scheduled for: October 29, 2021  Jone Baseman, RN, MSN [place] [facility] Care Management Coordinator Direct Line [phone]

## 2021-10-24 ENCOUNTER — Ambulatory Visit: Payer: Medicare Other | Admitting: Nurse Practitioner

## 2021-10-29 ENCOUNTER — Ambulatory Visit: Payer: Self-pay

## 2021-10-29 NOTE — Patient Instructions (Signed)
Visit Information  Thank you for taking time to visit with me today. Please don't hesitate to contact me if I can be of assistance to you.   Following are the goals we discussed today:   Goals Addressed             This Visit's Progress    Diabetes Management       Care Coordination Interventions: Provided education to patient about basic DM disease process Reviewed medications with patient and discussed importance of medication adherence Discussed plans with patient for ongoing care management follow up and provided patient with direct contact information for care management team Patient blood sugars continue to be less than 200. With last reading being 192.  Encouraged patient to continue to adhere to diabetes diet and medications.  She verbalized understanding.            Our next appointment is by telephone on 11/26/21 at 9:15 am  Please call the care guide team at [phone] if you need to cancel or reschedule your appointment.   If you are experiencing a Mental Health or [place] or need someone to talk to, please call the Suicide and Crisis Lifeline: 988   The patient verbalized understanding of instructions, educational materials, and care plan provided today and agreed to receive a mailed copy of patient instructions, educational materials, and care plan.   Telephone follow up appointment with care management team member scheduled for: 11/26/21  Jone Baseman, RN, MSN [place] [facility] Care Management Coordinator Direct Line [phone]

## 2021-10-29 NOTE — Patient Outreach (Signed)
  Care Coordination   Follow Up Visit Note   10/29/2021 Name: CLAUDELL RHODY MRN: [id number] DOB: Apr 12, 1955  [name] is a 66 y.o. year old female who sees Niue, Anupa, DO for primary care. I spoke with  [name] by phone today.  What matters to the patients health and wellness today?  Continued diabetes management    Goals Addressed             This Visit's Progress    Diabetes Management       Care Coordination Interventions: Provided education to patient about basic DM disease process Reviewed medications with patient and discussed importance of medication adherence Discussed plans with patient for ongoing care management follow up and provided patient with direct contact information for care management team Patient blood sugars continue to be less than 200. With last reading being 192.  Encouraged patient to continue to adhere to diabetes diet and medications.  She verbalized understanding.            SDOH assessments and interventions completed:  Yes     Care Coordination Interventions Activated:  Yes  Care Coordination Interventions:  Yes, provided   Follow up plan: Follow up call scheduled for 11/26/21    Encounter Outcome:  Pt. Visit Completed   Jone Baseman, RN, MSN [place] [facility] Care Management Coordinator Direct Line [phone]

## 2021-11-13 ENCOUNTER — Ambulatory Visit: Payer: Medicare Other | Attending: Nurse Practitioner | Admitting: Nurse Practitioner

## 2021-11-13 ENCOUNTER — Encounter: Payer: Self-pay | Admitting: Nurse Practitioner

## 2021-11-13 VITALS — BP 110/68 | HR 67 | Ht 64.0 in | Wt 170.2 lb

## 2021-11-13 DIAGNOSIS — G4733 Obstructive sleep apnea (adult) (pediatric): Secondary | ICD-10-CM

## 2021-11-13 DIAGNOSIS — E785 Hyperlipidemia, unspecified: Secondary | ICD-10-CM | POA: Diagnosis not present

## 2021-11-13 DIAGNOSIS — E1165 Type 2 diabetes mellitus with hyperglycemia: Secondary | ICD-10-CM

## 2021-11-13 DIAGNOSIS — I10 Essential (primary) hypertension: Secondary | ICD-10-CM

## 2021-11-13 DIAGNOSIS — I2510 Atherosclerotic heart disease of native coronary artery without angina pectoris: Secondary | ICD-10-CM | POA: Diagnosis not present

## 2021-11-13 DIAGNOSIS — Z8673 Personal history of transient ischemic attack (TIA), and cerebral infarction without residual deficits: Secondary | ICD-10-CM | POA: Diagnosis not present

## 2021-11-13 NOTE — Progress Notes (Signed)
Office Visit    Patient Name: Holly Acosta Date of Encounter: 11/13/2021  Primary Care Provider:  Donney Dice, DO Primary Cardiologist:  Peter Martinique, MD  Chief Complaint    66 year old female with a history of CAD, hypertension, hyperlipidemia, CVA, type 2 diabetes, and OSA who presents for follow-up related to CAD.  Past Medical History    Past Medical History:  Diagnosis Date   Acute cerebrovascular accident (CVA) of cerebellum ([place]) 04/29/2017   Acute cerebrovascular accident (CVA) of cerebellum ([place]) 04/29/2017   Acute respiratory failure with hypoxia (HCC)    AKI (acute kidney injury) ([place])    Anemia    Arthritis of knee    "both knees" (04/24/2017)   Cerebrovascular accident (CVA) due to occlusion of left posterior cerebral artery ([place]) 12/03/2017   Chest pain 04/24/2017   COLONIC POLYPS, HYPERPLASTIC 09/13/2009   Qualifier: Diagnosis of  By: Amil Amen MD, Elizabeth     Constipation 06/02/2017   Coronary artery disease involving native coronary artery of native heart with unstable angina pectoris ([place]) 04/30/2017   Dehydration    Diabetes mellitus, type 2 ([place])    History of blood transfusion    "related to prolapsed uterus"   HLD (hyperlipidemia)    HTN (hypertension)    Non-ST elevation (NSTEMI) myocardial infarction (HCC)    Non-ST elevation (NSTEMI) myocardial infarction ([place])    Pneumonia due to COVID-19 virus 02/18/2019   Prolapsed uterus    Severe obstructive sleep apnea-hypopnea syndrome 01/01/2018   Sleep apnea    "have mask; haven't worn it for a long time" (04/24/2017)   Status post coronary artery stent placement    Past Surgical History:  Procedure Laterality Date   CARDIAC CATHETERIZATION  2011   CORONARY STENT INTERVENTION N/A 04/27/2017   Procedure: CORONARY STENT INTERVENTION;  Surgeon: Jettie Booze, MD;  Location: [facility];  Service: Cardiovascular;  Laterality: N/A;   LEFT HEART CATH AND CORONARY ANGIOGRAPHY N/A 04/27/2017    Procedure: LEFT HEART CATH AND CORONARY ANGIOGRAPHY;  Surgeon: Jettie Booze, MD;  Location: [facility];  Service: Cardiovascular;  Laterality: N/A;   TUBAL LIGATION      Allergies  Allergies  Allergen Reactions   Lipitor [Atorvastatin] Other (See Comments)    Cramping in feet that resolved after stopping med   Other Nausea And Vomiting and Other (See Comments)    GLP-1: Trulicity, Ozempic, Victoza - vomiting and cramping    Diclofenac Nausea Only and Other (See Comments)    Caused a lot of stomach pain    Trulicity [Dulaglutide] Nausea And Vomiting and Other (See Comments)    GLP-1: Trulicity, Ozempic, Victoza - vomiting and cramping     History of Present Illness    66 year old female with the above past medical history including CAD, hypertension, hyperlipidemia, CVA, type 2 diabetes.  Cardiac catheterization in 2008 showed no significant CAD.  She was later hospitalized in 2019 in the setting of NSTEMI.  Cardiac catheterization showed severe stenosis of the left circumflex artery s/p DES.  Her hospital course was complicated by an acute ischemic left cerebral infarct and an acute ischemic nonhemorrhagic right cerebellar infarct.  Echocardiogram at the time revealed EF 55 to 60%, hypokinesis of the basal inferior, inferolateral, and mid inferolateral walls, G1 DD, normal RV systolic function, no significant valvular abnormalities.  She was hospitalized in January 2021 with COVID-pneumonia, acute hypoxic respiratory failure.  Last seen in the office on 05/21/2020 and was stable from a  cardiac standpoint.  She denied symptoms concerning for angina.    She presents today for follow-up.  Since her last visit she has done well from a cardiac standpoint.  She denies any symptoms concerning for angina.  She is working with her PCP to help manage her diabetes.  Overall, she reports feeling well.  Home Medications    Current Outpatient Medications  Medication Sig Dispense Refill    Accu-Chek FastClix Lancets MISC 1 each by Does not apply route daily. Check sugar daily as needed 102 each 3   acetaminophen (TYLENOL) 500 MG tablet Take 1,000 mg by mouth every 12 (twelve) hours as needed for mild pain.     amLODipine (NORVASC) 5 MG tablet TAKE 1 TABLET BY MOUTH EVERY DAY 90 tablet 3   aspirin EC 81 MG tablet Take 1 tablet (81 mg total) by mouth daily. 90 tablet 3   B-D UF III MINI PEN NEEDLES 31G X 5 MM MISC USE AS DIRECTED 3 TIMES A DAY 100 each 2   Blood Glucose Monitoring Suppl (ACCU-CHEK GUIDE) w/Device KIT Please use to check blood sugar up to three times daily. 1 kit 0   carvedilol (COREG) 12.5 MG tablet TAKE 1 TABLET BY MOUTH 2 TIMES DAILY WITH A MEAL. 180 tablet 3   dapagliflozin propanediol (FARXIGA) 10 MG TABS tablet Take 1 tablet (10 mg total) by mouth daily. 90 tablet 3   ezetimibe (ZETIA) 10 MG tablet TAKE 1 TABLET BY MOUTH EVERY DAY 90 tablet 3   glucose blood (ACCU-CHEK GUIDE) test strip Please use to check blood sugar up to three times daily. 300 strip 11   HUMALOG KWIKPEN 100 UNIT/ML KwikPen INJECT 14 UNITS INTO THE SKIN 3 (THREE) TIMES DAILY. 3 mL 3   insulin glargine (LANTUS SOLOSTAR) 100 UNIT/ML Solostar Pen INJECT 32 UNITS INTO SKIN DAILY 15 mL 11   lisinopril (ZESTRIL) 10 MG tablet Take 1 tablet (10 mg total) by mouth daily. 30 tablet 11   metFORMIN (GLUCOPHAGE) 1000 MG tablet Take 1 tablet (1,000 mg total) by mouth 2 (two) times daily with a meal. 180 tablet 3   Omega-3 Fatty Acids (FISH OIL) 1000 MG CAPS Take 1 mg by mouth in the morning and at bedtime.     rosuvastatin (CRESTOR) 40 MG tablet TAKE 1 TABLET BY MOUTH EVERY DAY 90 tablet 3   No current facility-administered medications for this visit.     Review of Systems    She denies chest pain, palpitations, dyspnea, pnd, orthopnea, n, v, dizziness, syncope, edema, weight gain, or early satiety. All other systems reviewed and are otherwise negative except as noted above.   Physical Exam    VS:   BP 110/68   Pulse 67   Ht _0  (1.626 m)   Wt 170 lb 3.2 oz (77.2 kg)   SpO2 97%   BMI 29.21 kg/m   GEN: Well nourished, well developed, in no acute distress. HEENT: normal. Neck: Supple, no JVD, carotid bruits, or masses. Cardiac: RRR, no murmurs, rubs, or gallops. No clubbing, cyanosis, edema.  Radials/DP/PT 2+ and equal bilaterally.  Respiratory:  Respirations regular and unlabored, clear to auscultation bilaterally. GI: Soft, nontender, nondistended, BS + x 4. MS: no deformity or atrophy. Skin: warm and dry, no rash. Neuro:  Strength and sensation are intact. Psych: Normal affect.  Accessory Clinical Findings    ECG personally reviewed by me today -NSR, 67 bpm- no acute changes.   Lab Results  Component Value Date  WBC 10.2 03/01/2019   HGB 11.8 03/01/2019   HCT 37.0 03/01/2019   MCV 87 03/01/2019   PLT 350 03/01/2019   Lab Results  Component Value Date   CREATININE 1.11 (H) 03/06/2020   BUN 15 03/06/2020   NA 136 03/06/2020   K 4.5 03/06/2020   CL 99 03/06/2020   CO2 21 03/06/2020   Lab Results  Component Value Date   ALT 21 03/01/2019   AST 12 03/01/2019   ALKPHOS 162 (H) 03/01/2019   BILITOT 0.2 03/01/2019   Lab Results  Component Value Date   CHOL 266 (H) 03/06/2020   HDL 35 (L) 03/06/2020   LDLCALC 155 (H) 03/06/2020   LDLDIRECT 135 (H) 12/31/2012   TRIG 403 (H) 03/06/2020   CHOLHDL 7.6 (H) 03/06/2020    Lab Results  Component Value Date   HGBA1C 13.0 (A) 09/06/2021    Assessment & Plan    1. CAD: S/p NSTEMI, DES-LCx 2019.  Echo at the time showed EF 55 to 60%, hypokinesis of the basal inferior, inferolateral, and mid inferolateral walls, G1 DD, normal RV systolic function, no significant valvular abnormalities. Stable with no anginal symptoms. No indication for ischemic evaluation.  Continue aspirin, carvedilol, amlodipine, lisinopril, Crestor, and Zetia.  2. Hypertension: BP well controlled. Continue current antihypertensive regimen.   3.  Hyperlipidemia: LDL was 155 in 02/2020.  Will repeat lipids, LFTs.  Continue aspirin, Crestor, Zetia.  If LDL remains elevated above goal, would recommend referral to lipid clinic Pharm.D. for consideration of PCSK9 inhibitor.  4. History of CVA: No recurrence.  Continue aspirin, Crestor, Zetia.  5. Type 2 diabetes: A1c was 13.0 in 08/2021.  Monitored and managed per PCP.  6. OSA: Adherent to CPAP, denies any concerns.  7. Disposition: Follow-up in 1 year.     Lenna Sciara, NP 11/13/2021, 9:53 AM

## 2021-11-13 NOTE — Patient Instructions (Signed)
Medication Instructions:  Your physician recommends that you continue on your current medications as directed. Please refer to the Current Medication list given to you today.   *If you need a refill on your cardiac medications before your next appointment, please call your pharmacy*   Lab Work: Your physician recommends that you return for lab work in 1-2 weeks LIPID panel & LFTs  If you have labs (blood work) drawn today and your tests are completely normal, you will receive your results only by: [place] (if you have MyChart) OR A paper copy in the mail If you have any lab test that is abnormal or we need to change your treatment, we will call you to review the results.   Testing/Procedures: NONE ordered at this time of appointment     Follow-Up: At [facility], you and your health needs are our priority.  As part of our continuing mission to provide you with exceptional heart care, we have created designated Provider Care Teams.  These Care Teams include your primary Cardiologist (physician) and Advanced Practice Providers (APPs -  Physician Assistants and Nurse Practitioners) who all work together to provide you with the care you need, when you need it.  We recommend signing up for the patient portal called "MyChart".  Sign up information is provided on this After Visit Summary.  MyChart is used to connect with patients for Virtual Visits (Telemedicine).  Patients are able to view lab/test results, encounter notes, upcoming appointments, etc.  Non-urgent messages can be sent to your provider as well.   To learn more about what you can do with MyChart, go to [email or web address].    Your next appointment:   1 year(s)  The format for your next appointment:   In Person  Provider:   Peter Martinique, MD     Other Instructions   Important Information About Sugar

## 2021-11-14 ENCOUNTER — Other Ambulatory Visit: Payer: Self-pay | Admitting: Family Medicine

## 2021-11-26 ENCOUNTER — Ambulatory Visit: Payer: Self-pay

## 2021-11-26 NOTE — Patient Outreach (Signed)
  Care Coordination   11/26/2021 Name: Holly Acosta MRN: [id number] DOB: 21-Jun-1955   Care Coordination Outreach Attempts:  An unsuccessful telephone outreach was attempted for a scheduled appointment today.  Follow Up Plan:  Additional outreach attempts will be made to offer the patient care coordination information and services.   Encounter Outcome:  No Answer  Care Coordination Interventions Activated:  No   Care Coordination Interventions:  No, not indicated    Jone Baseman, RN, MSN [facility] Care Management Care Management Coordinator Direct Line [phone]

## 2021-12-03 NOTE — Progress Notes (Unsigned)
PATIENT: Holly Acosta DOB: 10/15/1955  REASON FOR VISIT: follow up HISTORY FROM: patient  No chief complaint on file.    HISTORY OF PRESENT ILLNESS:  12/03/21 ALL: Holly Acosta returns for follow up for OSA on BiPAP.    05/23/2021 ALL:  Holly Acosta returns for follow up for OSA on BiPAP. We last saw her 01/2020 and she had resumed therapy. Four hour compliance was sub optimal. Repeat download in 04/2020 showed improvement and she was encouraged to continue working toward compliance goals. She has been lost to follow up. She reports that she has continued to use CPAP regularly. She did have a break in usage for a couple of weeks in March following cataract surgery but has resumed therapy and doing well. She has used every night for the past 3.5 weeks. She does note a leak in her FFM. She feels if she tightens mask, it hurts bridge of nose.     02/01/2020 ALL:  She is doing well. She has resumed BiPAP therapy and is doing well. She denies difficulty with her machine or supplies. She has not heard anything regarding recall. She does note some air leaking from her mask but may need a replacement.   Compliance report dated 01/01/2020 through 01/30/2020 reveals that she used CPAP 29 of the past 30 days for compliance of 96.7%.  She used CPAP greater than 4 hours 66.7% of the time.  Average usage on days used was 4 hours and 21 minutes.  Residual AHI was 5.7 with a EPAP of 18 and IPAP of 21.  Average time in large leak per day was 1 hour and 39 minutes.   History (copied from previous notes) 10/26/2019 Holly Acosta is a 66 y.o. female here today for follow up for OSA on BiPAP. She was admitted to the hospital 01/2019 for PNA in setting of Covid.  She reports that she is recovered nicely.  She continues to follow-up very closely with primary care.  Diabetes has not been well controlled recently but she is working closely with her providers.  She has not used BiPAP consistently.  She has been concerned about the  recall.  She thinks that someone told her her machine was not effective.  She does not use an external cleanser.  She does not expose BiPAP machine to excessive heat.  She is motivated to continue using BiPAP as she feels much less tired when she is using it consistently.  Compliance report dated 09/12/2019 through 10/11/2019 reveals that she used BiPAP therapy six of the past 30 days for compliance of 20%.  She used CPAP greater than 4 hours three of the past 30 days for compliance of 10%.  Residual AHI was 2.9 with a EPAP of 18 cm of water and IPAP of 21 cm of water.  There was no leak noted.  HISTORY: (copied from my note on 02/10/2019)  UA negativeMary H Acosta is a 66 y.o. female here today for follow up for OSA on BiPAP.  Four hour usage remains sub optimal. She continues to work on meeting 4 hour goal. She admits that sometimes she falls asleep without placing BiPAP and sometimes she pulls her mask off during the night. Otherwise, she is doing very well. She notes benefit of BiPAP therapy.    Compliance report dated 01/10/2019 through 02/08/2019 reveals that she used BiPAP 28 of the last 30 days compliance of 93%.  She used BiPAP greater than 4 hours 57% of the time.  Average  usage was 3 hours and 59 minutes.  Residual AHI was 5.4 with IPAP of 21 cm of water and EPAP of 18 cm of water. No significant leak noted.    HISTORY: (copied from my note on 08/09/2018)   Holly Acosta is a 66 y.o. female here today for follow up of OSA on BiPAP.  She is doing well with therapy.  She is using her machine every night.  She reports that she continues to struggle with using it for more than 4 hours each night.  She does feel that this is improving with time.  She denies any concerns with mask.  Compliance download dated 07/06/2018 through 08/04/2018 reveals that she is using her machine 29 of the last 30 days.  On average she used her machine 3 hours and 34 minutes.  AHI was 6.1 with EPAP of 18 cm of water and IPAP of  21 cm of water.  There was no significant leak.  She does note significant improvement in her fatigue.   History (copied from my note on 04/29/2018)   Holly Acosta is a 66 y.o. female for follow up of OSA on BiPAP. She she reports that she is doing well with her BiPAP machine.  She definitely feels that it helps with sleep and her energy the next day.  Download report reveals that she is used her machine 27 out of the last 30 days for compliance of 90%.  63% compliance with use greater than 4 hours.  She averages about 4 hours and 53 minutes.  AHI is 8.1 with EPAP at 18 cm of water and IPAP at 21 cm of water.   History (copied from Dr Dohmeier's note on 12/03/2017)   HPI:  Holly Acosta is a 66 y.o. female patient and seen  on 12-03-2017 upon referral for a sleep evaluation.  Mrs. Hoeschen is a [facility] patient with the resident clinic, she was admitted to [facility] on 27 April 2017 for a cardiac infarct status post stent placement and she has been using aspirin and Brilinta.  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  Half function at the time was an ejection fraction of 55 to 60% with multi regional hypokinesis.  The patient has known diabetes which has been poorly controlled at the time her HbA1c was 13.3.  Additional risk factors for stroke and heart disease are hyperlipidemia, obesity and untreated obstructive sleep apnea.  The patient was seen by Dr. Cari Caraway and then by Dr. Leonie Man on 06 October 2017 HbA1c was now 9.3, still very elevated but improved.  She has a little bit of bruising and bleeding from Brilinta in combination with aspirin.  She is using a walker rehab had been delayed due to transportation problems.  She  takes Crestor and her blood pressure has been controlled.    Her previous sleep study 9/20/ 2011 was ordered through Drs. Mulberry and MacPherson, it revealed severe OSA at an AHI of 48/h. CPAP therapy was recommnended. and sleep evaluations were obtained through the [facility] lab. The machine is 66 years old and now  broken. She can't remember who followed her in the [facility] sleep medicine clinic. The study was read by Dr.  Clinton Young.      Chief complaint according to patient :" I may have to go back on CPAP - but I sleep fairly good" and " I easily fall asleep when I am inactive and not stimulated".   Sleep habits are as follows:Donner time is around 7 Pm, and she goes to bed most nights between 10 and 11 PM. Has a seperate bedroom from her living room, and retreats at that time. The bedroom is kept cool, quiet and dark. She sleeps on her back , some times on her left side. She needs 2 pillows and likes the head of bed elevated, sleeps with a fan in the background. She is not dreaming much, she stated.  Reports only one bathroom break around 4-5 AM. Wakes sponatanously between 6 and 7 AM , and she estimates 7-8 hours of sleep each night.  She wakes up refreshed. No headaches in AM- but sometimes with a dry mouth.    She naps in daytime, unscheduled. She lives with 2 granddaughters, who reported her snoring loudly and witnessed her having sleep apnea.      Medical history : already in record, reviewed.    Family history : Sister is only family member with sleep apnea.     Social history: her 2 granddaughters live at her house , she is divorced. Disability due to knee osteoarthritis, she became disabled 2015 and has yet not received hip or knee surgery . She quit smoking over 20 years ago, but one granddaughter smokes in the house. ETOH- none, caffeine - she drinks coffee in AM , iced tea for lunch and dinner    REVIEW OF SYSTEMS: Out of a complete 14 system review of symptoms, the  patient complains only of the following symptoms, fatigue, imbalance, and all other reviewed systems are negative.  ESS: 14, previously 12   ALLERGIES: Allergies  Allergen Reactions   Lipitor [Atorvastatin] Other (See Comments)    Cramping in feet that resolved after stopping med   Other Nausea And Vomiting and Other (See Comments)    GLP-1: Trulicity, Ozempic, Victoza - vomiting and cramping    Diclofenac Nausea Only and Other (See Comments)    Caused a lot of stomach pain    Trulicity [Dulaglutide] Nausea And Vomiting and Other (See Comments)    GLP-1: Trulicity, Ozempic, Victoza - vomiting and cramping     HOME MEDICATIONS: Outpatient Medications Prior to Visit  Medication Sig Dispense Refill   Accu-Chek FastClix Lancets MISC 1 each by Does not apply route daily. Check sugar daily as needed 102 each 3   acetaminophen (TYLENOL) 500 MG tablet Take 1,000 mg by mouth every 12 (twelve) hours as needed for mild pain.     amLODipine (NORVASC) 5 MG tablet TAKE 1 TABLET BY MOUTH EVERY DAY 90 tablet 3   aspirin EC 81 MG tablet Take 1 tablet (81 mg total) by mouth daily. 90 tablet 3   B-D UF III MINI PEN NEEDLES 31G X 5 MM MISC USE AS DIRECTED 3 TIMES A DAY 100 each 2   Blood Glucose Monitoring Suppl (ACCU-CHEK GUIDE ME) w/Device KIT PLEASE USE TO CHECK BLOOD SUGAR UP TO THREE TIMES DAILY. 1 kit 1   carvedilol (COREG) 12.5 MG tablet TAKE 1 TABLET BY MOUTH 2 TIMES DAILY WITH A MEAL. 180 tablet 3   dapagliflozin propanediol (FARXIGA) 10 MG TABS tablet Take 1 tablet (10 mg total) by mouth daily. 90 tablet 3   ezetimibe (ZETIA) 10 MG tablet TAKE  1 TABLET BY MOUTH EVERY DAY 90 tablet 3   glucose blood (ACCU-CHEK GUIDE) test strip Please use to check blood sugar up to three times daily. 300 strip 11   HUMALOG KWIKPEN 100 UNIT/ML KwikPen INJECT 14 UNITS INTO THE SKIN 3 (THREE) TIMES DAILY. 3 mL 3   insulin glargine (LANTUS SOLOSTAR) 100 UNIT/ML Solostar Pen INJECT 32 UNITS INTO SKIN DAILY 15 mL 11    lisinopril (ZESTRIL) 10 MG tablet Take 1 tablet (10 mg total) by mouth daily. 30 tablet 11   metFORMIN (GLUCOPHAGE) 1000 MG tablet Take 1 tablet (1,000 mg total) by mouth 2 (two) times daily with a meal. 180 tablet 3   Omega-3 Fatty Acids (FISH OIL) 1000 MG CAPS Take 1 mg by mouth in the morning and at bedtime.     rosuvastatin (CRESTOR) 40 MG tablet TAKE 1 TABLET BY MOUTH EVERY DAY 90 tablet 3   No facility-administered medications prior to visit.    PAST MEDICAL HISTORY: Past Medical History:  Diagnosis Date   Acute cerebrovascular accident (CVA) of cerebellum ([place]) 04/29/2017   Acute cerebrovascular accident (CVA) of cerebellum ([place]) 04/29/2017   Acute respiratory failure with hypoxia (HCC)    AKI (acute kidney injury) ([place])    Anemia    Arthritis of knee    "both knees" (04/24/2017)   Cerebrovascular accident (CVA) due to occlusion of left posterior cerebral artery ([place]) 12/03/2017   Chest pain 04/24/2017   COLONIC POLYPS, HYPERPLASTIC 09/13/2009   Qualifier: Diagnosis of  By: Amil Amen MD, Elizabeth     Constipation 06/02/2017   Coronary artery disease involving native coronary artery of native heart with unstable angina pectoris ([place]) 04/30/2017   Dehydration    Diabetes mellitus, type 2 ([place])    History of blood transfusion    "related to prolapsed uterus"   HLD (hyperlipidemia)    HTN (hypertension)    Non-ST elevation (NSTEMI) myocardial infarction (HCC)    Non-ST elevation (NSTEMI) myocardial infarction ([place])    Pneumonia due to COVID-19 virus 02/18/2019   Prolapsed uterus    Severe obstructive sleep apnea-hypopnea syndrome 01/01/2018   Sleep apnea    "have mask; haven't worn it for a long time" (04/24/2017)   Status post coronary artery stent placement     PAST SURGICAL HISTORY: Past Surgical History:  Procedure Laterality Date   CARDIAC CATHETERIZATION  2011   CORONARY STENT INTERVENTION N/A 04/27/2017   Procedure: CORONARY STENT INTERVENTION;  Surgeon: Jettie Booze, MD;  Location: [facility];  Service: Cardiovascular;  Laterality: N/A;   LEFT HEART CATH AND CORONARY ANGIOGRAPHY N/A 04/27/2017   Procedure: LEFT HEART CATH AND CORONARY ANGIOGRAPHY;  Surgeon: Jettie Booze, MD;  Location: [facility];  Service: Cardiovascular;  Laterality: N/A;   TUBAL LIGATION      FAMILY HISTORY: Family History  Problem Relation Age of Onset   Diabetes Mother    Hypertension Mother    Hypertension Father     SOCIAL HISTORY: Social History   Socioeconomic History   Marital status: Divorced    Spouse name: Not on file   Number of children: Not on file   Years of education: Not on file   Highest education level: Not on file  Occupational History   Not on file  Tobacco Use   Smoking status: Former    Packs/day: 1.00    Years: 33.00    Total pack years: 33.00    Types: Cigarettes    Start date:  01/20/1973    Quit date: 11/04/2006    Years since quitting: 15.0   Smokeless tobacco: Never  Vaping Use   Vaping Use: Never used  Substance and Sexual Activity   Alcohol use: Never    Alcohol/week: 0.0 standard drinks of alcohol   Drug use: Never   Sexual activity: Not Currently  Other Topics Concern   Not on file  Social History Narrative   Lives with daughter [name] age 2) and 4 grandchildren.   Disability - Arthritis. Does get disability check.   Does not have a living will yet   Social Determinants of Health   Financial Resource Strain: Not on file  Food Insecurity: No Food Insecurity (10/15/2021)   Hunger Vital Sign    Worried About Running Out of Food in the Last Year: Never true    Ran Out of Food in the Last Year: Never true  Transportation Needs: Not on file  Physical Activity: Not on file  Stress: Not on file  Social Connections: Not on file  Intimate Partner Violence: Not on file      PHYSICAL EXAM  There were no vitals filed for this visit.   There is no height or weight on file to calculate  BMI.  Generalized: Well developed, in no acute distress  Cardiology: normal rate and rhythm, no murmur noted Respiratory: clear to auscultation bilaterally  Neurological examination  Mentation: Alert oriented to time, place, history taking. Follows all commands speech and language fluent Cranial nerve II-XII: Pupils were equal round reactive to light. Extraocular movements were full, visual field were full  Motor: The motor testing reveals 5 over 5 strength of all 4 extremities. Good symmetric motor tone is noted throughout.  Gait and station: Gait is stable with Rolator   DIAGNOSTIC DATA (LABS, IMAGING, TESTING) - I reviewed patient records, labs, notes, testing and imaging myself where available.      No data to display           Lab Results  Component Value Date   WBC 10.2 03/01/2019   HGB 11.8 03/01/2019   HCT 37.0 03/01/2019   MCV 87 03/01/2019   PLT 350 03/01/2019      Component Value Date/Time   NA 136 03/06/2020 0915   K 4.5 03/06/2020 0915   CL 99 03/06/2020 0915   CO2 21 03/06/2020 0915   GLUCOSE 241 (H) 03/06/2020 0915   GLUCOSE 286 (H) 02/19/2019 0500   BUN 15 03/06/2020 0915   CREATININE 1.11 (H) 03/06/2020 0915   CREATININE 0.93 02/01/2015 1640   CALCIUM 10.0 03/06/2020 0915   PROT 5.2 (L) 03/01/2019 1742   ALBUMIN 3.1 (L) 03/01/2019 1742   AST 12 03/01/2019 1742   ALT 21 03/01/2019 1742   ALKPHOS 162 (H) 03/01/2019 1742   BILITOT 0.2 03/01/2019 1742   GFRNONAA 53 (L) 03/06/2020 0915   GFRNONAA 67 02/01/2015 1640   GFRAA 61 03/06/2020 0915   GFRAA 78 02/01/2015 1640   Lab Results  Component Value Date   CHOL 266 (H) 03/06/2020   HDL 35 (L) 03/06/2020   LDLCALC 155 (H) 03/06/2020   LDLDIRECT 135 (H) 12/31/2012   TRIG 403 (H) 03/06/2020   CHOLHDL 7.6 (H) 03/06/2020   Lab Results  Component Value Date   HGBA1C 13.0 (A) 09/06/2021   Lab Results  Component Value Date   VITAMINB12 281 03/01/2019   Lab Results  Component Value Date   TSH  0.422 04/28/2017       ASSESSMENT  AND PLAN 66 y.o. year old female  has a past medical history of Acute cerebrovascular accident (CVA) of cerebellum ([place]) (04/29/2017), Acute cerebrovascular accident (CVA) of cerebellum ([place]) (04/29/2017), Acute respiratory failure with hypoxia ([place]), AKI (acute kidney injury) ([place]), Anemia, Arthritis of knee, Cerebrovascular accident (CVA) due to occlusion of left posterior cerebral artery ([place]) (12/03/2017), Chest pain (04/24/2017), COLONIC POLYPS, HYPERPLASTIC (09/13/2009), Constipation (06/02/2017), Coronary artery disease involving native coronary artery of native heart with unstable angina pectoris ([place]) (04/30/2017), Dehydration, Diabetes mellitus, type 2 ([place]), History of blood transfusion, HLD (hyperlipidemia), HTN (hypertension), Non-ST elevation (NSTEMI) myocardial infarction [facility]), Non-ST elevation (NSTEMI) myocardial infarction ([place]), Pneumonia due to COVID-19 virus (02/18/2019), Prolapsed uterus, Severe obstructive sleep apnea-hypopnea syndrome (01/01/2018), Sleep apnea, and Status post coronary artery stent placement. here with   No diagnosis found.     Anabelen reports that she is doing well today.  She continues BiPAP therapy and reports that she is feeling better.  She is less tired throughout the day. 45 day compliance review shows sub optimal usage, however, review since 04/27/2021 shows daily usage.  Residual AHI is 6.1.  She was encouraged to use BiPAP nightly and for greater than 4 hours each night.  I have advised her to reach out to her DME company for mask refitting. She will monitor for concerns of continued leak. She will continue follow-up with primary care as directed.  Healthy lifestyle habits encouraged.  I will have her follow-up in 6 months, sooner if needed.  She verbalizes understanding and agreement with this plan.   No orders of the defined types were placed in this encounter.    No orders of the defined types were placed in this  encounter.       Debbora Presto, FNP-C 12/03/2021, 10:26 AM [facility] [address], [place], [place] [address] [phone]

## 2021-12-03 NOTE — Patient Instructions (Incomplete)
Please continue using your BiPAP regularly. While your insurance requires that you use BiPAP at least 4 hours each night on 70% of the nights, I recommend, that you not skip any nights and use it throughout the night if you can. Getting used to BiPAP and staying with the treatment long term does take time and patience and discipline. Untreated obstructive sleep apnea when it is moderate to severe can have an adverse impact on cardiovascular health and raise her risk for heart disease, arrhythmias, hypertension, congestive heart failure, stroke and diabetes. Untreated obstructive sleep apnea causes sleep disruption, nonrestorative sleep, and sleep deprivation. This can have an impact on your day to day functioning and cause daytime sleepiness and impairment of cognitive function, memory loss, mood disturbance, and problems focussing. Using BiPAP regularly can improve these symptoms.  Please monitor closely for leak at home. If you continue to note a leak, call DME for mask refitting.   Follow up in 1 year

## 2021-12-04 ENCOUNTER — Encounter: Payer: Self-pay | Admitting: Family Medicine

## 2021-12-04 ENCOUNTER — Ambulatory Visit (INDEPENDENT_AMBULATORY_CARE_PROVIDER_SITE_OTHER): Payer: Medicare Other | Admitting: Family Medicine

## 2021-12-04 VITALS — BP 140/66 | HR 69 | Ht 65.0 in | Wt 169.0 lb

## 2021-12-04 DIAGNOSIS — G4733 Obstructive sleep apnea (adult) (pediatric): Secondary | ICD-10-CM | POA: Diagnosis not present

## 2021-12-05 ENCOUNTER — Telehealth: Payer: Self-pay

## 2021-12-16 ENCOUNTER — Ambulatory Visit: Payer: Self-pay

## 2021-12-16 NOTE — Patient Instructions (Signed)
Visit Information  Thank you for taking time to visit with me today. Please don't hesitate to contact me if I can be of assistance to you.   Following are the goals we discussed today:   Goals Addressed             This Visit's Progress    Diabetes Management       Care Coordination Interventions: Provided education to patient about basic DM disease process Reviewed medications with patient and discussed importance of medication adherence Discussed plans with patient for ongoing care management follow up and provided patient with direct contact information for care management team Patient blood sugars up some recent increase on humalog.  Last sugar check was 220.   Encouraged patient to continue to adhere to diabetes diet and medications.  She verbalized understanding.           Our next appointment is by telephone on 01/07/22 at 915am  Please call the care guide team at [phone] if you need to cancel or reschedule your appointment.   If you are experiencing a Mental Health or [place] or need someone to talk to, please call the Suicide and Crisis Lifeline: 988   The patient verbalized understanding of instructions, educational materials, and care plan provided today and agreed to receive a mailed copy of patient instructions, educational materials, and care plan.   Telephone follow up appointment with care management team member scheduled for: December  Erubiel Manasco J Kamri Gotsch, [place], MSN [facility] Care Management Coordinator Direct Line [phone]

## 2021-12-22 ENCOUNTER — Other Ambulatory Visit: Payer: Self-pay | Admitting: Family Medicine

## 2022-01-07 ENCOUNTER — Ambulatory Visit: Payer: Self-pay

## 2022-01-07 NOTE — Patient Outreach (Signed)
  Care Coordination   01/07/2022 Name: Holly Acosta MRN: [id number] DOB: 1955/06/25   Care Coordination Outreach Attempts:  An unsuccessful telephone outreach was attempted today to offer the patient information about available care coordination services as a benefit of their health plan.   Follow Up Plan:  Additional outreach attempts will be made to offer the patient care coordination information and services.   Encounter Outcome:  No Answer   Care Coordination Interventions:  No, not indicated    Jone Baseman, RN, MSN [facility] Care Management Coordinator Direct Line [phone]

## 2022-01-18 ENCOUNTER — Emergency Department (HOSPITAL_COMMUNITY): Payer: Medicare Other

## 2022-01-18 ENCOUNTER — Inpatient Hospital Stay (HOSPITAL_COMMUNITY)
Admission: EM | Admit: 2022-01-18 | Discharge: 2022-01-21 | DRG: 871 | Disposition: A | Discharge status: Home / Self Care | Payer: Medicare Other | Attending: Internal Medicine | Admitting: Internal Medicine

## 2022-01-18 ENCOUNTER — Other Ambulatory Visit: Payer: Self-pay

## 2022-01-18 DIAGNOSIS — R911 Solitary pulmonary nodule: Secondary | ICD-10-CM | POA: Diagnosis present

## 2022-01-18 DIAGNOSIS — K449 Diaphragmatic hernia without obstruction or gangrene: Secondary | ICD-10-CM | POA: Diagnosis not present

## 2022-01-18 DIAGNOSIS — D519 Vitamin B12 deficiency anemia, unspecified: Secondary | ICD-10-CM | POA: Diagnosis not present

## 2022-01-18 DIAGNOSIS — R404 Transient alteration of awareness: Secondary | ICD-10-CM | POA: Diagnosis not present

## 2022-01-18 DIAGNOSIS — J189 Pneumonia, unspecified organism: Secondary | ICD-10-CM | POA: Diagnosis present

## 2022-01-18 DIAGNOSIS — E785 Hyperlipidemia, unspecified: Secondary | ICD-10-CM | POA: Diagnosis not present

## 2022-01-18 DIAGNOSIS — E1169 Type 2 diabetes mellitus with other specified complication: Secondary | ICD-10-CM | POA: Diagnosis not present

## 2022-01-18 DIAGNOSIS — D509 Iron deficiency anemia, unspecified: Secondary | ICD-10-CM | POA: Diagnosis not present

## 2022-01-18 DIAGNOSIS — E861 Hypovolemia: Secondary | ICD-10-CM | POA: Diagnosis not present

## 2022-01-18 DIAGNOSIS — Z888 Allergy status to other drugs, medicaments and biological substances status: Secondary | ICD-10-CM

## 2022-01-18 DIAGNOSIS — Z8249 Family history of ischemic heart disease and other diseases of the circulatory system: Secondary | ICD-10-CM

## 2022-01-18 DIAGNOSIS — Z7982 Long term (current) use of aspirin: Secondary | ICD-10-CM

## 2022-01-18 DIAGNOSIS — Z1152 Encounter for screening for COVID-19: Secondary | ICD-10-CM | POA: Diagnosis not present

## 2022-01-18 DIAGNOSIS — I700 Atherosclerosis of aorta: Secondary | ICD-10-CM | POA: Diagnosis not present

## 2022-01-18 DIAGNOSIS — I152 Hypertension secondary to endocrine disorders: Secondary | ICD-10-CM | POA: Diagnosis not present

## 2022-01-18 DIAGNOSIS — R918 Other nonspecific abnormal finding of lung field: Secondary | ICD-10-CM | POA: Diagnosis not present

## 2022-01-18 DIAGNOSIS — Z8616 Personal history of COVID-19: Secondary | ICD-10-CM | POA: Diagnosis not present

## 2022-01-18 DIAGNOSIS — Z743 Need for continuous supervision: Secondary | ICD-10-CM | POA: Diagnosis not present

## 2022-01-18 DIAGNOSIS — G4733 Obstructive sleep apnea (adult) (pediatric): Secondary | ICD-10-CM | POA: Diagnosis not present

## 2022-01-18 DIAGNOSIS — J154 Pneumonia due to other streptococci: Secondary | ICD-10-CM | POA: Diagnosis present

## 2022-01-18 DIAGNOSIS — D631 Anemia in chronic kidney disease: Secondary | ICD-10-CM | POA: Diagnosis present

## 2022-01-18 DIAGNOSIS — J9601 Acute respiratory failure with hypoxia: Secondary | ICD-10-CM | POA: Diagnosis not present

## 2022-01-18 DIAGNOSIS — A419 Sepsis, unspecified organism: Secondary | ICD-10-CM | POA: Diagnosis not present

## 2022-01-18 DIAGNOSIS — Z79899 Other long term (current) drug therapy: Secondary | ICD-10-CM

## 2022-01-18 DIAGNOSIS — I252 Old myocardial infarction: Secondary | ICD-10-CM

## 2022-01-18 DIAGNOSIS — Z87891 Personal history of nicotine dependence: Secondary | ICD-10-CM

## 2022-01-18 DIAGNOSIS — R7989 Other specified abnormal findings of blood chemistry: Secondary | ICD-10-CM | POA: Diagnosis not present

## 2022-01-18 DIAGNOSIS — R059 Cough, unspecified: Secondary | ICD-10-CM | POA: Diagnosis not present

## 2022-01-18 DIAGNOSIS — Z833 Family history of diabetes mellitus: Secondary | ICD-10-CM

## 2022-01-18 DIAGNOSIS — K7689 Other specified diseases of liver: Secondary | ICD-10-CM | POA: Diagnosis present

## 2022-01-18 DIAGNOSIS — E1159 Type 2 diabetes mellitus with other circulatory complications: Secondary | ICD-10-CM | POA: Diagnosis not present

## 2022-01-18 DIAGNOSIS — Z955 Presence of coronary angioplasty implant and graft: Secondary | ICD-10-CM | POA: Diagnosis not present

## 2022-01-18 DIAGNOSIS — E119 Type 2 diabetes mellitus without complications: Secondary | ICD-10-CM

## 2022-01-18 DIAGNOSIS — I25110 Atherosclerotic heart disease of native coronary artery with unstable angina pectoris: Secondary | ICD-10-CM | POA: Diagnosis present

## 2022-01-18 DIAGNOSIS — Z794 Long term (current) use of insulin: Secondary | ICD-10-CM

## 2022-01-18 DIAGNOSIS — Z8673 Personal history of transient ischemic attack (TIA), and cerebral infarction without residual deficits: Secondary | ICD-10-CM | POA: Diagnosis not present

## 2022-01-18 DIAGNOSIS — E1122 Type 2 diabetes mellitus with diabetic chronic kidney disease: Secondary | ICD-10-CM | POA: Diagnosis not present

## 2022-01-18 DIAGNOSIS — N1831 Chronic kidney disease, stage 3a: Secondary | ICD-10-CM | POA: Diagnosis present

## 2022-01-18 DIAGNOSIS — R000 Tachycardia, unspecified: Secondary | ICD-10-CM | POA: Diagnosis not present

## 2022-01-18 DIAGNOSIS — R531 Weakness: Secondary | ICD-10-CM | POA: Diagnosis not present

## 2022-01-18 DIAGNOSIS — K769 Liver disease, unspecified: Secondary | ICD-10-CM | POA: Diagnosis not present

## 2022-01-18 DIAGNOSIS — R509 Fever, unspecified: Secondary | ICD-10-CM | POA: Diagnosis not present

## 2022-01-18 DIAGNOSIS — Z7984 Long term (current) use of oral hypoglycemic drugs: Secondary | ICD-10-CM

## 2022-01-18 LAB — COMPREHENSIVE METABOLIC PANEL
ALT: 207 U/L — ABNORMAL HIGH (ref 0–44)
AST: 273 U/L — ABNORMAL HIGH (ref 15–41)
Albumin: 2.8 g/dL — ABNORMAL LOW (ref 3.5–5.0)
Alkaline Phosphatase: 71 U/L (ref 38–126)
Anion gap: 10 (ref 5–15)
BUN: 12 mg/dL (ref 8–23)
CO2: 20 mmol/L — ABNORMAL LOW (ref 22–32)
Calcium: 8.7 mg/dL — ABNORMAL LOW (ref 8.9–10.3)
Chloride: 102 mmol/L (ref 98–111)
Creatinine, Ser: 1.24 mg/dL — ABNORMAL HIGH (ref 0.44–1.00)
GFR, Estimated: 48 mL/min — ABNORMAL LOW (ref >60)
Glucose, Bld: 164 mg/dL — ABNORMAL HIGH (ref 70–99)
Potassium: 4.2 mmol/L (ref 3.5–5.1)
Sodium: 132 mmol/L — ABNORMAL LOW (ref 135–145)
Total Bilirubin: 0.5 mg/dL (ref 0.3–1.2)
Total Protein: 7.4 g/dL (ref 6.5–8.1)

## 2022-01-18 LAB — CBC WITH DIFFERENTIAL/PLATELET
Abs Immature Granulocytes: 0.28 10*3/uL — ABNORMAL HIGH (ref 0.00–0.07)
Basophils Absolute: 0.1 10*3/uL (ref 0.0–0.1)
Basophils Relative: 0 %
Eosinophils Absolute: 0 10*3/uL (ref 0.0–0.5)
Eosinophils Relative: 0 %
HCT: 37.6 % (ref 36.0–46.0)
Hemoglobin: 11.9 g/dL — ABNORMAL LOW (ref 12.0–15.0)
Immature Granulocytes: 2 %
Lymphocytes Relative: 9 %
Lymphs Abs: 1.6 10*3/uL (ref 0.7–4.0)
MCH: 26.6 pg (ref 26.0–34.0)
MCHC: 31.6 g/dL (ref 30.0–36.0)
MCV: 83.9 fL (ref 80.0–100.0)
Monocytes Absolute: 1.4 10*3/uL — ABNORMAL HIGH (ref 0.1–1.0)
Monocytes Relative: 8 %
Neutro Abs: 15.5 10*3/uL — ABNORMAL HIGH (ref 1.7–7.7)
Neutrophils Relative %: 81 %
Platelets: 282 10*3/uL (ref 150–400)
RBC: 4.48 MIL/uL (ref 3.87–5.11)
RDW: 14.4 % (ref 11.5–15.5)
WBC: 18.9 10*3/uL — ABNORMAL HIGH (ref 4.0–10.5)
nRBC: 0 % (ref 0.0–0.2)

## 2022-01-18 LAB — URINALYSIS, ROUTINE W REFLEX MICROSCOPIC
Bilirubin Urine: NEGATIVE
Glucose, UA: >=500 mg/dL — AB
Ketones, ur: NEGATIVE mg/dL
Leukocytes,Ua: NEGATIVE
Nitrite: NEGATIVE
Protein, ur: 30 mg/dL — AB
Specific Gravity, Urine: 1.014 (ref 1.005–1.030)
pH: 6 (ref 5.0–8.0)

## 2022-01-18 LAB — RESP PANEL BY RT-PCR (RSV, FLU A&B, COVID)  RVPGX2
Influenza A by PCR: NEGATIVE
Influenza B by PCR: NEGATIVE
Resp Syncytial Virus by PCR: NEGATIVE
SARS Coronavirus 2 by RT PCR: NEGATIVE

## 2022-01-18 LAB — APTT: aPTT: 33 seconds (ref 24–36)

## 2022-01-18 LAB — PROTIME-INR
INR: 1.3 — ABNORMAL HIGH (ref 0.8–1.2)
Prothrombin Time: 15.8 seconds — ABNORMAL HIGH (ref 11.4–15.2)

## 2022-01-18 LAB — BRAIN NATRIURETIC PEPTIDE: B Natriuretic Peptide: 69 pg/mL (ref 0.0–100.0)

## 2022-01-18 LAB — LACTIC ACID, PLASMA: Lactic Acid, Venous: 1.7 mmol/L (ref 0.5–1.9)

## 2022-01-18 LAB — GLUCOSE, CAPILLARY: Glucose-Capillary: 85 mg/dL (ref 70–99)

## 2022-01-18 LAB — TROPONIN I (HIGH SENSITIVITY): Troponin I (High Sensitivity): 11 ng/L (ref <18)

## 2022-01-18 MED ORDER — AMLODIPINE BESYLATE 10 MG PO TABS
5.0000 mg | ORAL_TABLET | Freq: Every day | ORAL | Status: DC
  Administered 2022-01-19 – 2022-01-21 (×3): 5 mg via ORAL
  Filled 2022-01-18 (×3): qty 1

## 2022-01-18 MED ORDER — ACETAMINOPHEN 650 MG RE SUPP: 650.0000 mg | Freq: Four times a day (QID) | RECTAL | Status: DC | PRN

## 2022-01-18 MED ORDER — SODIUM CHLORIDE 0.9 % IV SOLN
500.0000 mg | Freq: Once | INTRAVENOUS | Status: AC
  Administered 2022-01-18: 500 mg via INTRAVENOUS
  Filled 2022-01-18: qty 5

## 2022-01-18 MED ORDER — SODIUM CHLORIDE 0.9 % IV SOLN
1.0000 g | INTRAVENOUS | Status: DC
  Administered 2022-01-19 – 2022-01-20 (×2): 1 g via INTRAVENOUS
  Filled 2022-01-18 (×2): qty 10

## 2022-01-18 MED ORDER — ACETAMINOPHEN 500 MG PO TABS
1000.0000 mg | ORAL_TABLET | Freq: Once | ORAL | Status: AC
  Administered 2022-01-18: 1000 mg via ORAL
  Filled 2022-01-18: qty 2

## 2022-01-18 MED ORDER — ROSUVASTATIN CALCIUM 20 MG PO TABS: 40.0000 mg | ORAL_TABLET | Freq: Every day | ORAL | Status: DC

## 2022-01-18 MED ORDER — INSULIN ASPART 100 UNIT/ML IJ SOLN
0.0000 [IU] | Freq: Every day | INTRAMUSCULAR | Status: DC
  Administered 2022-01-19 – 2022-01-20 (×2): 4 [IU] via SUBCUTANEOUS

## 2022-01-18 MED ORDER — ALBUTEROL SULFATE (2.5 MG/3ML) 0.083% IN NEBU: 2.5000 mg | INHALATION_SOLUTION | RESPIRATORY_TRACT | Status: DC | PRN

## 2022-01-18 MED ORDER — ENOXAPARIN SODIUM 40 MG/0.4ML IJ SOSY
40.0000 mg | PREFILLED_SYRINGE | INTRAMUSCULAR | Status: DC
  Administered 2022-01-19 – 2022-01-21 (×3): 40 mg via SUBCUTANEOUS
  Filled 2022-01-18 (×3): qty 0.4

## 2022-01-18 MED ORDER — CARVEDILOL 12.5 MG PO TABS
12.5000 mg | ORAL_TABLET | Freq: Two times a day (BID) | ORAL | Status: DC
  Administered 2022-01-19 – 2022-01-21 (×5): 12.5 mg via ORAL
  Filled 2022-01-18 (×5): qty 1

## 2022-01-18 MED ORDER — INSULIN ASPART 100 UNIT/ML IJ SOLN
0.0000 [IU] | Freq: Three times a day (TID) | INTRAMUSCULAR | Status: DC
  Administered 2022-01-19: 2 [IU] via SUBCUTANEOUS
  Administered 2022-01-19: 5 [IU] via SUBCUTANEOUS
  Administered 2022-01-20: 7 [IU] via SUBCUTANEOUS
  Administered 2022-01-20: 2 [IU] via SUBCUTANEOUS

## 2022-01-18 MED ORDER — SODIUM CHLORIDE 0.9 % IV SOLN
1.0000 g | Freq: Once | INTRAVENOUS | Status: AC
  Administered 2022-01-18: 1 g via INTRAVENOUS
  Filled 2022-01-18: qty 10

## 2022-01-18 MED ORDER — SODIUM CHLORIDE 0.9 % IV SOLN
500.0000 mg | INTRAVENOUS | Status: DC
  Administered 2022-01-19 – 2022-01-20 (×2): 500 mg via INTRAVENOUS
  Filled 2022-01-18 (×3): qty 5

## 2022-01-18 MED ORDER — IOHEXOL 350 MG/ML SOLN
80.0000 mL | Freq: Once | INTRAVENOUS | Status: AC | PRN
  Administered 2022-01-18: 80 mL via INTRAVENOUS

## 2022-01-18 MED ORDER — ONDANSETRON HCL 4 MG PO TABS: 4.0000 mg | ORAL_TABLET | Freq: Four times a day (QID) | ORAL | Status: DC | PRN

## 2022-01-18 MED ORDER — LACTATED RINGERS IV BOLUS (SEPSIS)
1000.0000 mL | Freq: Once | INTRAVENOUS | Status: AC
  Administered 2022-01-18: 1000 mL via INTRAVENOUS

## 2022-01-18 MED ORDER — GUAIFENESIN ER 600 MG PO TB12
600.0000 mg | ORAL_TABLET | Freq: Two times a day (BID) | ORAL | Status: DC
  Administered 2022-01-18 – 2022-01-21 (×6): 600 mg via ORAL
  Filled 2022-01-18 (×6): qty 1

## 2022-01-18 MED ORDER — LACTATED RINGERS IV SOLN: INTRAVENOUS | Status: AC

## 2022-01-18 MED ORDER — SENNOSIDES-DOCUSATE SODIUM 8.6-50 MG PO TABS: 1.0000 | ORAL_TABLET | Freq: Every evening | ORAL | Status: DC | PRN

## 2022-01-18 MED ORDER — ASPIRIN 81 MG PO TBEC
81.0000 mg | DELAYED_RELEASE_TABLET | Freq: Every day | ORAL | Status: DC
  Administered 2022-01-19 – 2022-01-21 (×3): 81 mg via ORAL
  Filled 2022-01-18 (×3): qty 1

## 2022-01-18 MED ORDER — LISINOPRIL 10 MG PO TABS
10.0000 mg | ORAL_TABLET | Freq: Every day | ORAL | Status: DC
  Administered 2022-01-19 – 2022-01-21 (×3): 10 mg via ORAL
  Filled 2022-01-18 (×3): qty 1

## 2022-01-18 MED ORDER — ONDANSETRON HCL 4 MG/2ML IJ SOLN: 4.0000 mg | Freq: Four times a day (QID) | INTRAMUSCULAR | Status: DC | PRN

## 2022-01-18 MED ORDER — ACETAMINOPHEN 325 MG PO TABS: 650.0000 mg | ORAL_TABLET | Freq: Four times a day (QID) | ORAL | Status: DC | PRN

## 2022-01-18 MED ORDER — EZETIMIBE 10 MG PO TABS: 10.0000 mg | ORAL_TABLET | Freq: Every day | ORAL | Status: DC

## 2022-01-18 NOTE — Assessment & Plan Note (Addendum)
Mild AST and ALT elevation likely hypoperfusion from sepsis with hypovolemia.  CT negative for gallstones, gallbladder wall thickening, pericholecystic fluid, or biliary dilatation.  Couple.  Centimeter hypodense lesions within the right hepatic lobe were noted and felt to represent simple hepatic cyst. -Continue IV fluid hydration and repeat labs in a.m. -Hold statin and Zetia for now

## 2022-01-18 NOTE — ED Triage Notes (Signed)
Pt BIBA from home. Pt c/o increasing fatigue/weakness, cough since xmas eve. Pt responds to voice, but sleepy. Pt O2 88 upon EMS arrival, placed on 4L [place], not on O2 at baseline.  Temp 101.43f Ox4  BP: 130/62 HR: 82 RR: 30 CBG: 234

## 2022-01-18 NOTE — Assessment & Plan Note (Signed)
Continue BiPAP nightly. 

## 2022-01-18 NOTE — Assessment & Plan Note (Signed)
Resume home amlodipine, Coreg, lisinopril tomorrow.

## 2022-01-18 NOTE — Progress Notes (Signed)
Elink following for sepsis protocol. 

## 2022-01-18 NOTE — ED Notes (Signed)
Called to 4W to inform nurse that patient has been waiting to be transferred to unit

## 2022-01-18 NOTE — Assessment & Plan Note (Signed)
A 7 mm left upper lobe subpleural nodule was seen on CT imaging.  Noncontrast chest CT at 6-12 months is recommended.

## 2022-01-18 NOTE — Assessment & Plan Note (Signed)
Renal function relatively stable.  Continue to monitor.

## 2022-01-18 NOTE — Assessment & Plan Note (Signed)
Placed on SSI. 

## 2022-01-18 NOTE — Assessment & Plan Note (Addendum)
Denies chest pain.  Continue aspirin, Coreg.  Statin and Zetia on hold for now with elevated LFTs.

## 2022-01-18 NOTE — Assessment & Plan Note (Signed)
Hold statin and Zetia for now with elevated LFTs.

## 2022-01-18 NOTE — Assessment & Plan Note (Signed)
Secondary to pneumonia.  SpO2 88% on room air on arrival.  Has been requiring up to 4 L O2 via [place] to maintain SpO2 >90%. -Continue supplemental oxygen and wean as able

## 2022-01-18 NOTE — Hospital Course (Signed)
Holly Acosta is a 66 y.o. female with medical history significant for CAD s/p DES LCx 2019, history of CVA, T2DM, HTN, HLD, CKD stage IIIa, OSA on BiPAP who is admitted with sepsis due to left lower lobe pneumonia.

## 2022-01-18 NOTE — Assessment & Plan Note (Signed)
Presenting with fever, leukocytosis, tachypnea and CT findings of left lower lobe pneumonia. -Continue IV ceftriaxone and azithromycin -Follow-up blood cultures, strep pneumonia urinary antigen -COVID, flu, RSV negative -Continue IV fluid hydration overnight -Continue supplemental O2 and wean as able

## 2022-01-18 NOTE — ED Provider Notes (Signed)
[facility] DEPT Provider Note   CSN: [id number] Arrival date & time: 01/18/22  1459     History  Chief Complaint  Patient presents with   Fatigue   Fever    Holly Acosta is a 66 y.o. female.  HPI   66 year old female with medical history significant for prior cerebellar CVA, CAD, NSTEMI, HTN, HLD OSA on CPAP who presents to the emergency department with increasing fatigue and weakness and a mildly productive cough since this Christmas eve.  She states that she is not on oxygen at baseline.  EMS placed her on 4 L O2 due to hypoxia to 88%.  She was found to be febrile to 101.3, heart rates in the high 90s, tachypneic.  She denies any lower extremity swelling.  She denies any chest pain.  She denies any neck pain or stiffness.  She arrives AOx4.  Home Medications Prior to Admission medications   Medication Sig Start Date End Date Taking? Authorizing Provider  Accu-Chek FastClix Lancets MISC 1 each by Does not apply route daily. Check sugar daily as needed 09/09/21   Ganta, Anupa, DO  acetaminophen (TYLENOL) 500 MG tablet Take 1,000 mg by mouth every 12 (twelve) hours as needed for mild pain.    [provider]  amLODipine (NORVASC) 5 MG tablet TAKE 1 TABLET BY MOUTH EVERY DAY 07/17/21   Martinique, Peter M, MD  aspirin EC 81 MG tablet Take 1 tablet (81 mg total) by mouth daily. 03/30/14   Olam Idler, MD  B-D UF III MINI PEN NEEDLES 31G X 5 MM MISC USE AS DIRECTED 3 TIMES A DAY 12/23/21   Ganta, Anupa, DO  Blood Glucose Monitoring Suppl (ACCU-CHEK GUIDE ME) w/Device KIT PLEASE USE TO CHECK BLOOD SUGAR UP TO THREE TIMES DAILY. 11/14/21   Ganta, Anupa, DO  carvedilol (COREG) 12.5 MG tablet TAKE 1 TABLET BY MOUTH 2 TIMES DAILY WITH A MEAL. 09/13/21   Ganta, Anupa, DO  dapagliflozin propanediol (FARXIGA) 10 MG TABS tablet Take 1 tablet (10 mg total) by mouth daily. 03/25/21   Ganta, Anupa, DO  ezetimibe (ZETIA) 10 MG tablet TAKE 1 TABLET BY MOUTH EVERY DAY  05/06/21   Ganta, Anupa, DO  glucose blood (ACCU-CHEK GUIDE) test strip Please use to check blood sugar up to three times daily. 09/09/21   Ganta, Anupa, DO  HUMALOG KWIKPEN 100 UNIT/ML KwikPen INJECT 14 UNITS INTO THE SKIN 3 (THREE) TIMES DAILY. 07/05/21   Ganta, Anupa, DO  insulin glargine (LANTUS SOLOSTAR) 100 UNIT/ML Solostar Pen INJECT 32 UNITS INTO SKIN DAILY 05/22/21   Ganta, Anupa, DO  lisinopril (ZESTRIL) 10 MG tablet Take 1 tablet (10 mg total) by mouth daily. 05/06/21   Donney Dice, DO  metFORMIN (GLUCOPHAGE) 1000 MG tablet Take 1 tablet (1,000 mg total) by mouth 2 (two) times daily with a meal. 03/19/21   Ganta, Anupa, DO  Omega-3 Fatty Acids (FISH OIL) 1000 MG CAPS Take 1 mg by mouth in the morning and at bedtime.    [provider]  rosuvastatin (CRESTOR) 40 MG tablet TAKE 1 TABLET BY MOUTH EVERY DAY 08/13/21   Larae Grooms, Anupa, DO      Allergies    Lipitor [atorvastatin], Other, Diclofenac, and Trulicity [dulaglutide]    Review of Systems   Review of Systems  All other systems reviewed and are negative.   Physical Exam Updated Vital Signs BP 105/61   Pulse 70   Temp 98.6 F (37 C) (Oral)   Resp  20   Ht _0  (1.651 m)   Wt 77.1 kg   SpO2 97%   BMI 28.29 kg/m  Physical Exam Vitals and nursing note reviewed.  Constitutional:      General: She is not in acute distress.    Appearance: She is well-developed.  HENT:     Head: Normocephalic and atraumatic.  Eyes:     Conjunctiva/sclera: Conjunctivae normal.  Neck:     Comments: No meningismus Cardiovascular:     Rate and Rhythm: Regular rhythm. Tachycardia present.  Pulmonary:     Effort: Pulmonary effort is normal. No respiratory distress.     Breath sounds: Rhonchi and rales present.  Abdominal:     Palpations: Abdomen is soft.     Tenderness: There is no abdominal tenderness.  Musculoskeletal:        General: No swelling.     Cervical back: Neck supple.  Skin:    General: Skin is warm and dry.      Capillary Refill: Capillary refill takes less than 2 seconds.  Neurological:     Mental Status: She is alert.  Psychiatric:        Mood and Affect: Mood normal.     ED Results / Procedures / Treatments   Labs (all labs ordered are listed, but only abnormal results are displayed) Labs Reviewed  COMPREHENSIVE METABOLIC PANEL - Abnormal; Notable for the following components:      Result Value   Sodium 132 (*)    CO2 20 (*)    Glucose, Bld 164 (*)    Creatinine, Ser 1.24 (*)    Calcium 8.7 (*)    Albumin 2.8 (*)    AST 273 (*)    ALT 207 (*)    GFR, Estimated 48 (*)    All other components within normal limits  CBC WITH DIFFERENTIAL/PLATELET - Abnormal; Notable for the following components:   WBC 18.9 (*)    Hemoglobin 11.9 (*)    Neutro Abs 15.5 (*)    Monocytes Absolute 1.4 (*)    Abs Immature Granulocytes 0.28 (*)    All other components within normal limits  PROTIME-INR - Abnormal; Notable for the following components:   Prothrombin Time 15.8 (*)    INR 1.3 (*)    All other components within normal limits  URINALYSIS, ROUTINE W REFLEX MICROSCOPIC - Abnormal; Notable for the following components:   Glucose, UA >=500 (*)    Hgb urine dipstick SMALL (*)    Protein, ur 30 (*)    Bacteria, UA RARE (*)    All other components within normal limits  CULTURE, BLOOD (ROUTINE X 2)  RESP PANEL BY RT-PCR (RSV, FLU A&B, COVID)  [id number]  CULTURE, BLOOD (ROUTINE X 2)  URINE CULTURE  LACTIC ACID, PLASMA  APTT  BRAIN NATRIURETIC PEPTIDE  TROPONIN I (HIGH SENSITIVITY)  TROPONIN I (HIGH SENSITIVITY)    EKG EKG Interpretation  Date/Time:  Saturday January 18 2022 15:32:57 EST Ventricular Rate:  99 PR Interval:  167 QRS Duration: 101 QT Interval:  349 QTC Calculation: 448 R Axis:   -34 Text Interpretation: Sinus tachycardia Ventricular premature complex LVH with secondary repolarization abnormality Confirmed by Regan Lemming ([id number]) on 01/18/2022 3:37:49 PM  Radiology CT  Angio Chest PE W and/or Wo Contrast  Result Date: 01/18/2022 CLINICAL DATA:  Increasing fatigue/weakness, cough since Christmas Eve. Pulmonary embolism (PE) suspected, high prob; Abdominal pain, acute, nonlocalized RUQ pain EXAM: CT ANGIOGRAPHY CHEST CT ABDOMEN AND PELVIS WITH CONTRAST TECHNIQUE: Multidetector  CT imaging of the chest was performed using the standard protocol during bolus administration of intravenous contrast. Multiplanar CT image reconstructions and MIPs were obtained to evaluate the vascular anatomy. Multidetector CT imaging of the abdomen and pelvis was performed using the standard protocol during bolus administration of intravenous contrast. RADIATION DOSE REDUCTION: This exam was performed according to the departmental dose-optimization program which includes automated exposure control, adjustment of the mA and/or kV according to patient size and/or use of iterative reconstruction technique. CONTRAST:  38m OMNIPAQUE IOHEXOL 350 MG/ML SOLN COMPARISON:  None Available. FINDINGS: CTA CHEST FINDINGS Cardiovascular: No pulmonary embolus. Main pulmonary artery is normal in caliber. No evidence of thoracic aortic aneurysm or dissection. Normal heart size. Trace pericardial effusion. Moderate atherosclerotic plaque of the thoracic aorta. At least 3 vessel coronary artery calcifications. Mediastinum/Nodes: Borderline enlarged right hilar lymph node measuring up to 1 cm (4:21). No enlarged mediastinal, left hilar, or axillary lymph nodes. Right thyroid gland subcentimeter hypodensity. Not clinically significant; no follow-up imaging recommended (ref: J Am Coll Radiol. 2015 Feb;12(2): 143-50). Trachea and esophagus demonstrate no significant findings. Tiny hiatal hernia. Lungs/Pleura: Left lower lobe base peribronchovascular consolidation with air bronchograms. 6 mm left upper lobe subpleural nodule (4:24). No pulmonary mass. No pleural effusion. No pneumothorax. Musculoskeletal: No chest wall  abnormality. No suspicious lytic or blastic osseous lesions. No acute displaced fracture. Degenerative changes of the left shoulder. Review of the MIP images confirms the above findings. CT ABDOMEN and PELVIS FINDINGS Hepatobiliary: Couple pericentimeter hypodense lesions within the right hepatic lobe likely represent simple hepatic cysts. No gallstones, gallbladder wall thickening, or pericholecystic fluid. No biliary dilatation. Pancreas: No focal lesion. Normal pancreatic contour. No surrounding inflammatory changes. No main pancreatic ductal dilatation. Spleen: Normal in size without focal abnormality.  Exophytic right Adrenals/Urinary Tract: No adrenal nodule bilaterally. Bilateral kidneys enhance symmetrically. Exophytic superior as well as inferior right renal pole fluid dense lesions likely represent simple renal cysts. Similar finding on the left (4:70). Simple renal cysts, in the absence of clinically indicated signs/symptoms, require no independent follow-up. No hydronephrosis. No hydroureter. The urinary bladder is unremarkable. On delayed imaging, there is no urothelial wall thickening and there are no filling defects in the opacified portions of the bilateral collecting systems or ureters. Stomach/Bowel: Stomach is within normal limits. No evidence of bowel wall thickening or dilatation. Appendix appears normal. Vascular/Lymphatic: No abdominal aorta or iliac aneurysm. Severe atherosclerotic plaque of the aorta and its branches. No abdominal, pelvic, or inguinal lymphadenopathy. Reproductive: Lobulated uterus with a right intramural 4.9 cm hyperdense lesion likely representing a fibroid. Other coarsely calcified lesions within the uterus likely represent degenerative uterine fibroids. Endometrium not visualized. Bilateral adnexa are unremarkable. Pessary device noted. Other: No intraperitoneal free fluid. No intraperitoneal free gas. No organized fluid collection. Musculoskeletal: Large supraumbilical  fat containing ventral wall hernia with an abdominal defect of 1.8 cm. Small fat containing umbilical hernia with an abdominal defect of 1 cm. No suspicious lytic or blastic osseous lesions. No acute displaced fracture. Severe degenerative changes and deformity of the left hip. Review of the MIP images confirms the above findings. IMPRESSION: 1. No pulmonary embolus. 2. Left lower lobe base peribronchovascular consolidation with air bronchograms suggestive of infection/inflammation. Recommend follow-up CT in 3 months to evaluate for resolution and exclude underlying mass lesion. 3. A 7 mm left upper lobe subpleural nodule. Non-contrast chest CT at 6-12 months is recommended. If the nodule is stable at time of repeat CT, then future CT at 18-24 months (  from today's scan) is considered optional for low-risk patients, but is recommended for high-risk patients. This recommendation follows the consensus statement: Guidelines for Management of Incidental Pulmonary Nodules Detected on CT Images: From the Fleischner Society 2017; Radiology 2017; 284:228-243. 4. Indeterminate borderline enlarged right hilar lymph node. Recommend attention on follow-up. 5. Tiny hiatal hernia. 6. Uterine fibroids. 7. Colonic diverticulosis with no acute diverticulitis. 8. Severe degenerative changes and deformity of the left hip. 9. Aortic Atherosclerosis (ICD10-I70.0) including at least 3 vessel coronary calcification. 10. Supraumbilical and umbilical fat contained ventral wall hernias with no findings to suggest incarceration. Electronically Signed   By: Iven Finn M.D.   On: 01/18/2022 19:53   CT ABDOMEN PELVIS W CONTRAST  Result Date: 01/18/2022 CLINICAL DATA:  Increasing fatigue/weakness, cough since Christmas Eve. Pulmonary embolism (PE) suspected, high prob; Abdominal pain, acute, nonlocalized RUQ pain EXAM: CT ANGIOGRAPHY CHEST CT ABDOMEN AND PELVIS WITH CONTRAST TECHNIQUE: Multidetector CT imaging of the chest was performed  using the standard protocol during bolus administration of intravenous contrast. Multiplanar CT image reconstructions and MIPs were obtained to evaluate the vascular anatomy. Multidetector CT imaging of the abdomen and pelvis was performed using the standard protocol during bolus administration of intravenous contrast. RADIATION DOSE REDUCTION: This exam was performed according to the departmental dose-optimization program which includes automated exposure control, adjustment of the mA and/or kV according to patient size and/or use of iterative reconstruction technique. CONTRAST:  63m OMNIPAQUE IOHEXOL 350 MG/ML SOLN COMPARISON:  None Available. FINDINGS: CTA CHEST FINDINGS Cardiovascular: No pulmonary embolus. Main pulmonary artery is normal in caliber. No evidence of thoracic aortic aneurysm or dissection. Normal heart size. Trace pericardial effusion. Moderate atherosclerotic plaque of the thoracic aorta. At least 3 vessel coronary artery calcifications. Mediastinum/Nodes: Borderline enlarged right hilar lymph node measuring up to 1 cm (4:21). No enlarged mediastinal, left hilar, or axillary lymph nodes. Right thyroid gland subcentimeter hypodensity. Not clinically significant; no follow-up imaging recommended (ref: J Am Coll Radiol. 2015 Feb;12(2): 143-50). Trachea and esophagus demonstrate no significant findings. Tiny hiatal hernia. Lungs/Pleura: Left lower lobe base peribronchovascular consolidation with air bronchograms. 6 mm left upper lobe subpleural nodule (4:24). No pulmonary mass. No pleural effusion. No pneumothorax. Musculoskeletal: No chest wall abnormality. No suspicious lytic or blastic osseous lesions. No acute displaced fracture. Degenerative changes of the left shoulder. Review of the MIP images confirms the above findings. CT ABDOMEN and PELVIS FINDINGS Hepatobiliary: Couple pericentimeter hypodense lesions within the right hepatic lobe likely represent simple hepatic cysts. No gallstones,  gallbladder wall thickening, or pericholecystic fluid. No biliary dilatation. Pancreas: No focal lesion. Normal pancreatic contour. No surrounding inflammatory changes. No main pancreatic ductal dilatation. Spleen: Normal in size without focal abnormality.  Exophytic right Adrenals/Urinary Tract: No adrenal nodule bilaterally. Bilateral kidneys enhance symmetrically. Exophytic superior as well as inferior right renal pole fluid dense lesions likely represent simple renal cysts. Similar finding on the left (4:70). Simple renal cysts, in the absence of clinically indicated signs/symptoms, require no independent follow-up. No hydronephrosis. No hydroureter. The urinary bladder is unremarkable. On delayed imaging, there is no urothelial wall thickening and there are no filling defects in the opacified portions of the bilateral collecting systems or ureters. Stomach/Bowel: Stomach is within normal limits. No evidence of bowel wall thickening or dilatation. Appendix appears normal. Vascular/Lymphatic: No abdominal aorta or iliac aneurysm. Severe atherosclerotic plaque of the aorta and its branches. No abdominal, pelvic, or inguinal lymphadenopathy. Reproductive: Lobulated uterus with a right intramural 4.9 cm hyperdense lesion likely  representing a fibroid. Other coarsely calcified lesions within the uterus likely represent degenerative uterine fibroids. Endometrium not visualized. Bilateral adnexa are unremarkable. Pessary device noted. Other: No intraperitoneal free fluid. No intraperitoneal free gas. No organized fluid collection. Musculoskeletal: Large supraumbilical fat containing ventral wall hernia with an abdominal defect of 1.8 cm. Small fat containing umbilical hernia with an abdominal defect of 1 cm. No suspicious lytic or blastic osseous lesions. No acute displaced fracture. Severe degenerative changes and deformity of the left hip. Review of the MIP images confirms the above findings. IMPRESSION: 1. No  pulmonary embolus. 2. Left lower lobe base peribronchovascular consolidation with air bronchograms suggestive of infection/inflammation. Recommend follow-up CT in 3 months to evaluate for resolution and exclude underlying mass lesion. 3. A 7 mm left upper lobe subpleural nodule. Non-contrast chest CT at 6-12 months is recommended. If the nodule is stable at time of repeat CT, then future CT at 18-24 months (from today's scan) is considered optional for low-risk patients, but is recommended for high-risk patients. This recommendation follows the consensus statement: Guidelines for Management of Incidental Pulmonary Nodules Detected on CT Images: From the Fleischner Society 2017; Radiology 2017; 284:228-243. 4. Indeterminate borderline enlarged right hilar lymph node. Recommend attention on follow-up. 5. Tiny hiatal hernia. 6. Uterine fibroids. 7. Colonic diverticulosis with no acute diverticulitis. 8. Severe degenerative changes and deformity of the left hip. 9. Aortic Atherosclerosis (ICD10-I70.0) including at least 3 vessel coronary calcification. 10. Supraumbilical and umbilical fat contained ventral wall hernias with no findings to suggest incarceration. Electronically Signed   By: Iven Finn M.D.   On: 01/18/2022 19:53   DG Chest Port 1 View  Result Date: 01/18/2022 CLINICAL DATA:  Weakness, cough EXAM: PORTABLE CHEST 1 VIEW COMPARISON:  02/16/2009 FINDINGS: Heart size within normal limits. Aortic atherosclerosis. Low lung volumes with streaky bibasilar opacities. No pleural effusion or pneumothorax. Advanced degenerative changes of the left glenohumeral joint. IMPRESSION: Low lung volumes with streaky bibasilar opacities, likely atelectasis. Electronically Signed   By: Davina Poke D.O.   On: 01/18/2022 15:55    Procedures .Critical Care  Performed by: Regan Lemming, MD Authorized by: Regan Lemming, MD   Critical care provider statement:    Critical care time (minutes):  30   Critical  care was necessary to treat or prevent imminent or life-threatening deterioration of the following conditions:  Sepsis   Critical care was time spent personally by me on the following activities:  Development of treatment plan with patient or surrogate, discussions with consultants, evaluation of patient's response to treatment, examination of patient, ordering and review of laboratory studies, ordering and review of radiographic studies, ordering and performing treatments and interventions, pulse oximetry, re-evaluation of patient's condition and review of old charts   Care discussed with: admitting provider       Medications Ordered in ED Medications  lactated ringers infusion ( Intravenous New Bag/Given 01/18/22 1721)  acetaminophen (TYLENOL) tablet 1,000 mg (1,000 mg Oral Given 01/18/22 1623)  cefTRIAXone (ROCEPHIN) 1 g in sodium chloride 0.9 % 100 mL IVPB (0 g Intravenous Stopped 01/18/22 1714)  azithromycin (ZITHROMAX) 500 mg in sodium chloride 0.9 % 250 mL IVPB (0 mg Intravenous Stopped 01/18/22 1931)  lactated ringers bolus 1,000 mL (0 mLs Intravenous Stopped 01/18/22 1714)  iohexol (OMNIPAQUE) 350 MG/ML injection 80 mL (80 mLs Intravenous Contrast Given 01/18/22 1859)    ED Course/ Medical Decision Making/ A&P Clinical Course as of 01/18/22 2059  Sat Jan 18, 2022  1611 WBC(!): 18.9 [JL]  1611 Temp(!): 101.3 F (38.5 C) [JL]  1611 Pulse Rate: 99 [JL]    Clinical Course User Index [JL] Regan Lemming, MD                           Medical Decision Making Amount and/or Complexity of Data Reviewed Labs: ordered. Decision-making details documented in ED Course. Radiology: ordered. ECG/medicine tests: ordered.  Risk OTC drugs. Prescription drug management. Decision regarding hospitalization.    65 year old female with medical history significant for prior cerebellar CVA, CAD, NSTEMI, HTN, HLD OSA on CPAP who presents to the emergency department with increasing fatigue and  weakness and a mildly productive cough since this Christmas eve.  She states that she is not on oxygen at baseline.  EMS placed her on 4 L O2 due to hypoxia to 88%.  She was found to be febrile to 101.3, heart rates in the high 90s, tachypneic.  She denies any lower extremity swelling.  She denies any chest pain.  She denies any neck pain or stiffness.  She arrives AOx4.   Rival, the patient was found to one 1.3, borderline tachycardic with sinus tachycardia noted on cardiac telemetry on my evaluation, initial heart rate 99, tachypneic RR 33, BP 139/65, hypoxic, placed on 4 L O2 via nasal cannula.  Her exam is most notable for rhonchi and rales noted bilaterally.  My immediate concern is for a life-threatening infection in this patient. Evaluation and treatment for sepsis began on my initial evaluation.  The most likely infectious source is respiratory.  The patient denies IVDU. Thorough skin exam revealed no significant open wounds.  Differential diagnosis includes pneumonia, viral URI, COVID-19, influenza, PE, ascending cholangitis, intra-abdominal infection, UTI.  Labs: Lactic acidosis to 2.1, downtrending to 1.7, leukocytosis to 18.9, anemia to 11.9, CMP with elevated serum creatinine 1.24, elevated LFTs with an AST of 273, ALT of 207, urinalysis without evidence of UTI, COVID-19, influenza, RSV PCR testing collected and pending.  Blood cultures and urine cultures collected and pending.  BNP was normal and initial troponin was normal.  Imaging: X-ray without clear focal consolidation to suggest pneumonia.   CT Abdomen Pelvis and CTA PE study: IMPRESSION:  1. No pulmonary embolus.  2. Left lower lobe base peribronchovascular consolidation with air  bronchograms suggestive of infection/inflammation. Recommend  follow-up CT in 3 months to evaluate for resolution and exclude  underlying mass lesion.  3. A 7 mm left upper lobe subpleural nodule. Non-contrast chest CT  at 6-12 months is  recommended. If the nodule is stable at time of  repeat CT, then future CT at 18-24 months (from today's scan) is  considered optional for low-risk patients, but is recommended for  high-risk patients. This recommendation follows the consensus  statement: Guidelines for Management of Incidental Pulmonary Nodules  Detected on CT Images: From the Fleischner Society 2017; Radiology  2017; 284:228-243.  4. Indeterminate borderline enlarged right hilar lymph node.  Recommend attention on follow-up.  5. Tiny hiatal hernia.  6. Uterine fibroids.  7. Colonic diverticulosis with no acute diverticulitis.  8. Severe degenerative changes and deformity of the left hip.  9. Aortic Atherosclerosis (ICD10-I70.0) including at least 3 vessel  coronary calcification.  10. Supraumbilical and umbilical fat contained ventral wall hernias  with no findings to suggest incarceration.    For treatment, the patient was given 1L LR for IVF and Rocephin and azithromycin for antimicrobials.  Upon reassessment, the patient appears clinically improved. The patient  is not currently hypotensive and has a MAP of >65. The patient's volume status appears to be improved after fluid resuscitation.  Based off the presentation and lab findings, the patient meets criteria for severe sepsis.  [SEVERE SEPSIS] HYPOTENSION (Nursing BPA for hypotension = Is this sepsis?) CREATININE > 2.0, or urine output < 0.5 mL/kg/hour for 2 hours  BILIRUBIN > 2 mg/dL (34.2 mmol/L)  PLATELET COUNT < 100,000  INR > 1.5 or aPTT > 60 sec  LACTATE > 2 mmol/L   [SEPTIC SHOCK] HYPOTENSION REFRACTORY TO 30 cc/kg OF FLUIDS LACTATE > 4 mmol/L  While in the ED, the patient was given: Medications  lactated ringers infusion ( Intravenous New Bag/Given 01/18/22 1721)  acetaminophen (TYLENOL) tablet 1,000 mg (1,000 mg Oral Given 01/18/22 1623)  cefTRIAXone (ROCEPHIN) 1 g in sodium chloride 0.9 % 100 mL IVPB (0 g Intravenous Stopped 01/18/22 1714)   azithromycin (ZITHROMAX) 500 mg in sodium chloride 0.9 % 250 mL IVPB (0 mg Intravenous Stopped 01/18/22 1931)  lactated ringers bolus 1,000 mL (0 mLs Intravenous Stopped 01/18/22 1714)  iohexol (OMNIPAQUE) 350 MG/ML injection 80 mL (80 mLs Intravenous Contrast Given 01/18/22 1859)     Chauncey Cruel Simerson was admitted to the hospital for ongoing treatment and monitoring.  Dr. Posey Pronto of hospitalist medicine excepted the patient in admission.  Final Clinical Impression(s) / ED Diagnoses Final diagnoses:  Sepsis, due to unspecified organism, unspecified whether acute organ dysfunction present [facility])  Pneumonia due to infectious organism, unspecified laterality, unspecified part of lung    Rx / DC Orders ED Discharge Orders     None         Regan Lemming, MD 01/18/22 2059

## 2022-01-18 NOTE — H&P (Signed)
History and Physical    Holly Acosta:[id number] DOB: 09/21/1955 DOA: 01/18/2022  PCP: Donney Dice, DO  Patient coming from: Home  I have personally briefly reviewed patient's old medical records in [place]  Chief Complaint: Cough, fatigue  HPI: Holly Acosta is a 66 y.o. female with medical history significant for CAD s/p DES LCx 2019, history of CVA, T2DM, HTN, HLD, CKD stage IIIa, OSA on BiPAP who presented to the ED for evaluation of cough and fatigue.  Patient reports developing fatigue, generalized weakness, chills and diaphoresis beginning Christmas Eve.  She has had cough occasionally productive of yellow sputum.  She denies shortness of breath however her granddaughter did note that she appeared to have increased work of breathing.  She has had poor oral intake and has felt dehydrated.  She denies chest pain, abdominal pain, nausea, vomiting.  ED Course  Labs/Imaging on admission: I have personally reviewed following labs and imaging studies.  Initial vitals showed BP 130/63, pulse 100, RR 30, temp 101.3 F, SpO2 initially 88% on room air subsequently placed on 4 L O2 via [place].  Labs show WBC 18.9, hemoglobin 11.9, platelets 282,000, sodium 132, potassium 4.2, bicarb 20, BUN 12, creatinine 1.24, serum glucose 164, AST 273, ALT 207, alk phos 71, total bilirubin 0.5, lactic acid 1.7, troponin 11.  Blood cultures in process.  Urinalysis negative for UTI.  COVID, influenza, RSV PCR is negative.  Portable chest x-ray showed low lung volumes with streaky bibasilar opacities.  CTA chest and CT abdomen/pelvis were negative for evidence of PE.  Left lower lobe base peribronchovascular consolidation with air bronchograms seen.  A 7 mm left upper lobe subpleural nodule noted.  Indeterminate borderline enlarged right hilar lymph node also noted.  Hiatal hernia, uterine fibroids, diverticulosis without diverticulitis and severe degenerative changes and deformity of the left hip  seen.  Patient was given IV ceftriaxone and azithromycin, 1 L LR.  The hospitalist service was consulted to admit for further evaluation and management.  Review of Systems: All systems reviewed and are negative except as documented in history of present illness above.   Past Medical History:  Diagnosis Date   Acute cerebrovascular accident (CVA) of cerebellum ([place]) 04/29/2017   Acute cerebrovascular accident (CVA) of cerebellum ([place]) 04/29/2017   Acute respiratory failure with hypoxia (HCC)    AKI (acute kidney injury) ([place])    Anemia    Arthritis of knee    "both knees" (04/24/2017)   Cerebrovascular accident (CVA) due to occlusion of left posterior cerebral artery ([place]) 12/03/2017   Chest pain 04/24/2017   COLONIC POLYPS, HYPERPLASTIC 09/13/2009   Qualifier: Diagnosis of  By: Amil Amen MD, Elizabeth     Constipation 06/02/2017   Coronary artery disease involving native coronary artery of native heart with unstable angina pectoris ([place]) 04/30/2017   Dehydration    Diabetes mellitus, type 2 ([place])    History of blood transfusion    "related to prolapsed uterus"   HLD (hyperlipidemia)    HTN (hypertension)    Non-ST elevation (NSTEMI) myocardial infarction (HCC)    Non-ST elevation (NSTEMI) myocardial infarction ([place])    Pneumonia due to COVID-19 virus 02/18/2019   Prolapsed uterus    Severe obstructive sleep apnea-hypopnea syndrome 01/01/2018   Sleep apnea    "have mask; haven't worn it for a long time" (04/24/2017)   Status post coronary artery stent placement     Past Surgical History:  Procedure Laterality Date   CARDIAC CATHETERIZATION  2011  CORONARY STENT INTERVENTION N/A 04/27/2017   Procedure: CORONARY STENT INTERVENTION;  Surgeon: Jettie Booze, MD;  Location: [facility];  Service: Cardiovascular;  Laterality: N/A;   LEFT HEART CATH AND CORONARY ANGIOGRAPHY N/A 04/27/2017   Procedure: LEFT HEART CATH AND CORONARY ANGIOGRAPHY;  Surgeon: Jettie Booze, MD;   Location: [facility];  Service: Cardiovascular;  Laterality: N/A;   TUBAL LIGATION      Social History:  reports that she quit smoking about 15 years ago. Her smoking use included cigarettes. She started smoking about 49 years ago. She has a 33.00 pack-year smoking history. She has never used smokeless tobacco. She reports that she does not drink alcohol and does not use drugs.  Allergies  Allergen Reactions   Lipitor [Atorvastatin] Other (See Comments)    Cramping in feet that resolved after stopping med   Other Nausea And Vomiting and Other (See Comments)    GLP-1: Trulicity, Ozempic, Victoza - vomiting and cramping    Diclofenac Nausea Only and Other (See Comments)    Caused a lot of stomach pain    Trulicity [Dulaglutide] Nausea And Vomiting and Other (See Comments)    GLP-1: Trulicity, Ozempic, Victoza - vomiting and cramping     Family History  Problem Relation Age of Onset   Diabetes Mother    Hypertension Mother    Hypertension Father      Prior to Admission medications   Medication Sig Start Date End Date Taking? Authorizing Provider  Accu-Chek FastClix Lancets MISC 1 each by Does not apply route daily. Check sugar daily as needed 09/09/21   Ganta, Anupa, DO  acetaminophen (TYLENOL) 500 MG tablet Take 1,000 mg by mouth every 12 (twelve) hours as needed for mild pain.    [provider]  amLODipine (NORVASC) 5 MG tablet TAKE 1 TABLET BY MOUTH EVERY DAY 07/17/21   [place], Peter M, MD  aspirin EC 81 MG tablet Take 1 tablet (81 mg total) by mouth daily. 03/30/14   Olam Idler, MD  B-D UF III MINI PEN NEEDLES 31G X 5 MM MISC USE AS DIRECTED 3 TIMES A DAY 12/23/21   Ganta, Anupa, DO  Blood Glucose Monitoring Suppl (ACCU-CHEK GUIDE ME) w/Device KIT PLEASE USE TO CHECK BLOOD SUGAR UP TO THREE TIMES DAILY. 11/14/21   Ganta, Anupa, DO  carvedilol (COREG) 12.5 MG tablet TAKE 1 TABLET BY MOUTH 2 TIMES DAILY WITH A MEAL. 09/13/21   Ganta, Anupa, DO  dapagliflozin  propanediol (FARXIGA) 10 MG TABS tablet Take 1 tablet (10 mg total) by mouth daily. 03/25/21   Ganta, Anupa, DO  ezetimibe (ZETIA) 10 MG tablet TAKE 1 TABLET BY MOUTH EVERY DAY 05/06/21   Ganta, Anupa, DO  glucose blood (ACCU-CHEK GUIDE) test strip Please use to check blood sugar up to three times daily. 09/09/21   Ganta, Anupa, DO  HUMALOG KWIKPEN 100 UNIT/ML KwikPen INJECT 14 UNITS INTO THE SKIN 3 (THREE) TIMES DAILY. 07/05/21   Ganta, Anupa, DO  insulin glargine (LANTUS SOLOSTAR) 100 UNIT/ML Solostar Pen INJECT 32 UNITS INTO SKIN DAILY 05/22/21   Ganta, Anupa, DO  lisinopril (ZESTRIL) 10 MG tablet Take 1 tablet (10 mg total) by mouth daily. 05/06/21   Donney Dice, DO  metFORMIN (GLUCOPHAGE) 1000 MG tablet Take 1 tablet (1,000 mg total) by mouth 2 (two) times daily with a meal. 03/19/21   Ganta, Anupa, DO  Omega-3 Fatty Acids (FISH OIL) 1000 MG CAPS Take 1 mg by mouth in the morning  and at bedtime.    [provider]  rosuvastatin (CRESTOR) 40 MG tablet TAKE 1 TABLET BY MOUTH EVERY DAY 08/13/21   Donney Dice, DO    Physical Exam: Vitals:   01/18/22 2000 01/18/22 2030 01/18/22 2131 01/18/22 2200  BP: 105/63 105/61  126/69  Pulse: 72 70  73  Resp: _0 Temp:   97.7 F (36.5 C)   TempSrc:   Oral   SpO2: 96% 97%  94%  Weight:      Height:       Constitutional: Resting in bed, NAD, calm, comfortable Eyes: EOMI, lids and conjunctivae normal ENMT: Mucous membranes are moist. Posterior pharynx clear of any exudate or lesions.Normal dentition.  Neck: normal, supple, no masses. Respiratory: clear to auscultation bilaterally, no wheezing, no crackles. Normal respiratory effort. No accessory muscle use.  Cardiovascular: Regular rate and rhythm, no murmurs / rubs / gallops. Abdomen: no tenderness.  Soft nontender hernia above the umbilicus noted Musculoskeletal: no clubbing / cyanosis. No joint deformity upper and lower extremities. Good ROM, no contractures. Normal muscle tone.  Skin:  Diaphoretic.  No rashes, lesions, ulcers. No induration Neurologic: Sensation intact. Strength 5/5 in all 4.  Psychiatric:  Alert and oriented x 3. Normal mood.   EKG: Personally reviewed. Sinus tachycardia, rate 99, PVC present.  Rate is faster and PVCs new when compared to previous.  Assessment/Plan Principal Problem:   Sepsis due to left lower lobe pneumonia (HCC) Active Problems:   Coronary artery disease involving native coronary artery of native heart with unstable angina pectoris (HCC)   Acute respiratory failure with hypoxia (HCC)   Elevated LFTs   Insulin dependent type 2 diabetes mellitus ([place])   Hyperlipidemia associated with type 2 diabetes mellitus (HCC)   OSA (obstructive sleep apnea)   Hypertension associated with diabetes ([place])   Chronic kidney disease, stage 3a (HCC)   Lung nodule seen on imaging study   Holly Acosta is a 66 y.o. female with medical history significant for CAD s/p DES LCx 2019, history of CVA, T2DM, HTN, HLD, CKD stage IIIa, OSA on BiPAP who is admitted with sepsis due to left lower lobe pneumonia.  Assessment and Plan: * Sepsis due to left lower lobe pneumonia ([place]) Presenting with fever, leukocytosis, tachypnea and CT findings of left lower lobe pneumonia. -Continue IV ceftriaxone and azithromycin -Follow-up blood cultures, strep pneumonia urinary antigen -COVID, flu, RSV negative -Continue IV fluid hydration overnight -Continue supplemental O2 and wean as able  Coronary artery disease involving native coronary artery of native heart with unstable angina pectoris ([place]) Denies chest pain.  Continue aspirin, Coreg.  Statin and Zetia on hold for now with elevated LFTs.  Acute respiratory failure with hypoxia (HCC) Secondary to pneumonia.  SpO2 88% on room air on arrival.  Has been requiring up to 4 L O2 via [place] to maintain SpO2 >90%. -Continue supplemental oxygen and wean as able  Insulin dependent type 2 diabetes mellitus ([place]) Placed on  SSI.  Elevated LFTs Mild AST and ALT elevation likely hypoperfusion from sepsis with hypovolemia.  CT negative for gallstones, gallbladder wall thickening, pericholecystic fluid, or biliary dilatation.  Couple.  Centimeter hypodense lesions within the right hepatic lobe were noted and felt to represent simple hepatic cyst. -Continue IV fluid hydration and repeat labs in a.m. -Hold statin and Zetia for now  Lung nodule seen on imaging study A 7 mm left upper lobe subpleural nodule was seen on CT imaging.  Noncontrast chest  CT at 6-12 months is recommended.  Chronic kidney disease, stage 3a ([place]) Renal function relatively stable.  Continue to monitor.  Hypertension associated with diabetes ([place]) Resume home amlodipine, Coreg, lisinopril tomorrow.  OSA (obstructive sleep apnea) Continue BiPAP nightly.  Hyperlipidemia associated with type 2 diabetes mellitus ([place]) Hold statin and Zetia for now with elevated LFTs.  DVT prophylaxis: enoxaparin (LOVENOX) injection 40 mg Start: 01/18/22 2230 Code Status: Full code, confirmed with patient on admission Family Communication: Granddaughter at bedside Disposition Plan: From home and likely discharge to home pending clinical progress Consults called: None Severity of Illness: The appropriate patient status for this patient is INPATIENT. Inpatient status is judged to be reasonable and necessary in order to provide the required intensity of service to ensure the patient's safety. The patient's presenting symptoms, physical exam findings, and initial radiographic and laboratory data in the context of their chronic comorbidities is felt to place them at high risk for further clinical deterioration. Furthermore, it is not anticipated that the patient will be medically stable for discharge from the hospital within 2 midnights of admission.   * I certify that at the point of admission it is my clinical judgment that the patient will require inpatient  hospital care spanning beyond 2 midnights from the point of admission due to high intensity of service, high risk for further deterioration and high frequency of surveillance required.Zada Finders MD [facility]  If 7PM-7AM, please contact night-coverage [email or web address]  01/18/2022, 10:41 PM

## 2022-01-19 ENCOUNTER — Encounter (HOSPITAL_COMMUNITY): Payer: Self-pay | Admitting: Internal Medicine

## 2022-01-19 DIAGNOSIS — E1159 Type 2 diabetes mellitus with other circulatory complications: Secondary | ICD-10-CM | POA: Diagnosis not present

## 2022-01-19 DIAGNOSIS — N1831 Chronic kidney disease, stage 3a: Secondary | ICD-10-CM

## 2022-01-19 DIAGNOSIS — I152 Hypertension secondary to endocrine disorders: Secondary | ICD-10-CM

## 2022-01-19 DIAGNOSIS — I25110 Atherosclerotic heart disease of native coronary artery with unstable angina pectoris: Secondary | ICD-10-CM

## 2022-01-19 DIAGNOSIS — R7989 Other specified abnormal findings of blood chemistry: Secondary | ICD-10-CM

## 2022-01-19 DIAGNOSIS — Z794 Long term (current) use of insulin: Secondary | ICD-10-CM

## 2022-01-19 DIAGNOSIS — E785 Hyperlipidemia, unspecified: Secondary | ICD-10-CM

## 2022-01-19 DIAGNOSIS — J9601 Acute respiratory failure with hypoxia: Secondary | ICD-10-CM

## 2022-01-19 DIAGNOSIS — E119 Type 2 diabetes mellitus without complications: Secondary | ICD-10-CM

## 2022-01-19 DIAGNOSIS — E1169 Type 2 diabetes mellitus with other specified complication: Secondary | ICD-10-CM

## 2022-01-19 DIAGNOSIS — J189 Pneumonia, unspecified organism: Secondary | ICD-10-CM | POA: Diagnosis not present

## 2022-01-19 DIAGNOSIS — A419 Sepsis, unspecified organism: Secondary | ICD-10-CM | POA: Diagnosis not present

## 2022-01-19 DIAGNOSIS — R911 Solitary pulmonary nodule: Secondary | ICD-10-CM

## 2022-01-19 DIAGNOSIS — G4733 Obstructive sleep apnea (adult) (pediatric): Secondary | ICD-10-CM

## 2022-01-19 LAB — URINE CULTURE

## 2022-01-19 LAB — COMPREHENSIVE METABOLIC PANEL
ALT: 135 U/L — ABNORMAL HIGH (ref 0–44)
AST: 107 U/L — ABNORMAL HIGH (ref 15–41)
Albumin: 2.4 g/dL — ABNORMAL LOW (ref 3.5–5.0)
Alkaline Phosphatase: 58 U/L (ref 38–126)
Anion gap: 6 (ref 5–15)
BUN: 14 mg/dL (ref 8–23)
CO2: 24 mmol/L (ref 22–32)
Calcium: 8.6 mg/dL — ABNORMAL LOW (ref 8.9–10.3)
Chloride: 107 mmol/L (ref 98–111)
Creatinine, Ser: 1.13 mg/dL — ABNORMAL HIGH (ref 0.44–1.00)
GFR, Estimated: 54 mL/min — ABNORMAL LOW (ref >60)
Glucose, Bld: 118 mg/dL — ABNORMAL HIGH (ref 70–99)
Potassium: 4.3 mmol/L (ref 3.5–5.1)
Sodium: 137 mmol/L (ref 135–145)
Total Bilirubin: 0.4 mg/dL (ref 0.3–1.2)
Total Protein: 5.8 g/dL — ABNORMAL LOW (ref 6.5–8.1)

## 2022-01-19 LAB — FERRITIN: Ferritin: 185 ng/mL (ref 11–307)

## 2022-01-19 LAB — CBC
HCT: 31 % — ABNORMAL LOW (ref 36.0–46.0)
Hemoglobin: 9.5 g/dL — ABNORMAL LOW (ref 12.0–15.0)
MCH: 26.3 pg (ref 26.0–34.0)
MCHC: 30.6 g/dL (ref 30.0–36.0)
MCV: 85.9 fL (ref 80.0–100.0)
Platelets: 252 10*3/uL (ref 150–400)
RBC: 3.61 MIL/uL — ABNORMAL LOW (ref 3.87–5.11)
RDW: 14.6 % (ref 11.5–15.5)
WBC: 18 10*3/uL — ABNORMAL HIGH (ref 4.0–10.5)
nRBC: 0 % (ref 0.0–0.2)

## 2022-01-19 LAB — IRON AND TIBC
Iron: 16 ug/dL — ABNORMAL LOW (ref 28–170)
Saturation Ratios: 7 % — ABNORMAL LOW (ref 10.4–31.8)
TIBC: 227 ug/dL — ABNORMAL LOW (ref 250–450)
UIBC: 211 ug/dL

## 2022-01-19 LAB — STREP PNEUMONIAE URINARY ANTIGEN: Strep Pneumo Urinary Antigen: NEGATIVE

## 2022-01-19 LAB — RETICULOCYTES
Immature Retic Fract: 13.3 % (ref 2.3–15.9)
RBC.: 3.77 MIL/uL — ABNORMAL LOW (ref 3.87–5.11)
Retic Count, Absolute: 30.5 10*3/uL (ref 19.0–186.0)
Retic Ct Pct: 0.8 % (ref 0.4–3.1)

## 2022-01-19 LAB — EXPECTORATED SPUTUM ASSESSMENT W GRAM STAIN, RFLX TO RESP C

## 2022-01-19 LAB — VITAMIN B12: Vitamin B-12: 131 pg/mL — ABNORMAL LOW (ref 180–914)

## 2022-01-19 LAB — FOLATE: Folate: 11.9 ng/mL (ref >5.9)

## 2022-01-19 LAB — GLUCOSE, CAPILLARY
Glucose-Capillary: 103 mg/dL — ABNORMAL HIGH (ref 70–99)
Glucose-Capillary: 264 mg/dL — ABNORMAL HIGH (ref 70–99)
Glucose-Capillary: 183 mg/dL — ABNORMAL HIGH (ref 70–99)
Glucose-Capillary: 318 mg/dL — ABNORMAL HIGH (ref 70–99)

## 2022-01-19 LAB — HIV ANTIBODY (ROUTINE TESTING W REFLEX): HIV Screen 4th Generation wRfx: NONREACTIVE

## 2022-01-19 LAB — PROCALCITONIN: Procalcitonin: 0.59 ng/mL

## 2022-01-19 MED ORDER — INSULIN GLARGINE-YFGN 100 UNIT/ML ~~LOC~~ SOLN
30.0000 [IU] | Freq: Every day | SUBCUTANEOUS | Status: DC
  Administered 2022-01-19 – 2022-01-20 (×2): 30 [IU] via SUBCUTANEOUS
  Filled 2022-01-19 (×2): qty 0.3

## 2022-01-19 MED ORDER — HYDROCORTISONE 1 % EX CREA
TOPICAL_CREAM | Freq: Four times a day (QID) | CUTANEOUS | Status: DC
  Filled 2022-01-19: qty 28

## 2022-01-19 MED ORDER — GUAIFENESIN-DM 100-10 MG/5ML PO SYRP
5.0000 mL | ORAL_SOLUTION | ORAL | Status: DC | PRN
  Administered 2022-01-19: 5 mL via ORAL
  Filled 2022-01-19: qty 10

## 2022-01-19 NOTE — Progress Notes (Signed)
[facility]                                                                               Allexa Acoff, is a 66 y.o. female, DOB - Jul 08, 1955, GEZ:[id number] Admit date - 01/18/2022    Outpatient Primary MD for the patient is Ganta, Anupa, DO  LOS - 1  days    Brief summary   Holly Acosta is a 66 y.o. female with medical history significant for CAD s/p DES LCx 2019, history of CVA, T2DM, HTN, HLD, CKD stage IIIa, OSA on BiPAP who is admitted with sepsis due to left lower lobe pneumonia.   Assessment & Plan    Assessment and Plan: * Sepsis due to left lower lobe pneumonia ([place]) Presenting with fever, leukocytosis, tachypnea and CT findings of left lower lobe pneumonia. Started on IV antibiotics.  Continue the same, pro calcitonin is 0.59 Check urine for strep and legionella antigen.  COVID, FLU, RSV pcr negative.  Continue with IV fluids.  Wean her off oxygen. Sputum cultures ordered , pending.  Coronary artery disease involving native coronary artery of native heart with unstable angina pectoris ([place])  CAD s/p DES LCx 2019,  Denies chest pain.  Continue aspirin, Coreg.   Statin and Zetia on hold for now with elevated LFTs.  Acute respiratory failure with hypoxia (HCC) Secondary to pneumonia.  SpO2 88% on room air on arrival.  Has been requiring up to 4 L O2 via [place] to maintain SpO2 >90%. Wean oxygen as tolerated.   Insulin dependent type 2 diabetes mellitus (HCC) CBG (last 3)  Recent Labs    01/18/22 2256 01/19/22 0759 01/19/22 1238  GLUCAP 85 103* 264*   Resume SSI.  Continue with semglee and SSI. Get A1c.   Elevated LFTs Mild AST and ALT elevation likely hypoperfusion from sepsis with hypovolemia.   CT negative for gallstones, gallbladder wall thickening, pericholecystic fluid, or biliary dilatation.   Centimeter hypodense lesions within the right hepatic lobe were noted and felt to represent simple hepatic cyst. Holding statins for elevated liver  enzymes.  They are improving.    Lung nodule seen on imaging study A 7 mm left upper lobe subpleural nodule was seen on CT imaging.   Non contrast chest CT at 6-12 months is recommended.   Chronic kidney disease, stage 3a (HCC) Creatinine is 1.2 on admission, slightly improved to 1.1  Hypertension associated with diabetes ([place]) Well controlled. No change in meds.    OSA (obstructive sleep apnea) Continue BiPAP nightly.  Hyperlipidemia associated with type 2 diabetes mellitus ([place]) Hold statin and Zetia for now with elevated LFTs.   Anemia of chronic disease - hemoglobin of 9.5% - anemia panel. Ordered.      Estimated body mass index is 27.18 kg/m as calculated from the following:   Height as of this encounter: '5\' 5"'$  (1.651 m).   Weight as of this encounter: 74.1 kg.  Code Status: full code.  DVT Prophylaxis:  enoxaparin (LOVENOX) injection 40 mg Start: 01/19/22 1000   Level of Care: Level of care: Progressive Family Communication: none at bedside  Disposition Plan:     Remains inpatient appropriate:  iv antibiotics.   Procedures:  None.   Consultants:   None.   Antimicrobials:   Anti-infectives (From admission, onward)    Start     Dose/Rate Route Frequency Ordered Stop   01/19/22 1200  azithromycin (ZITHROMAX) 500 mg in sodium chloride 0.9 % 250 mL IVPB        500 mg 250 mL/hr over 60 Minutes Intravenous Every 24 hours 01/18/22 2227     01/19/22 1200  cefTRIAXone (ROCEPHIN) 1 g in sodium chloride 0.9 % 100 mL IVPB        1 g 200 mL/hr over 30 Minutes Intravenous Every 24 hours 01/18/22 2227     01/18/22 1545  cefTRIAXone (ROCEPHIN) 1 g in sodium chloride 0.9 % 100 mL IVPB        1 g 200 mL/hr over 30 Minutes Intravenous  Once 01/18/22 1538 01/18/22 1714   01/18/22 1545  azithromycin (ZITHROMAX) 500 mg in sodium chloride 0.9 % 250 mL IVPB        500 mg 250 mL/hr over 60 Minutes Intravenous  Once 01/18/22 1538 01/18/22 1931         Medications  Scheduled Meds:  amLODipine  5 mg Oral Daily   aspirin EC  81 mg Oral Daily   carvedilol  12.5 mg Oral BID WC   enoxaparin (LOVENOX) injection  40 mg Subcutaneous Q24H   guaiFENesin  600 mg Oral BID   hydrocortisone cream   Topical QID   insulin aspart  0-5 Units Subcutaneous QHS   insulin aspart  0-9 Units Subcutaneous TID WC   lisinopril  10 mg Oral Daily   Continuous Infusions:  azithromycin 500 mg (01/19/22 1344)   cefTRIAXone (ROCEPHIN)  IV 1 g (01/19/22 1307)   PRN Meds:.acetaminophen **OR** acetaminophen, albuterol, guaiFENesin-dextromethorphan, ondansetron **OR** ondansetron (ZOFRAN) IV, senna-docusate    Subjective:   Satara Virella was seen and examined today.   Breathing, and cough improving.    Objective:   Vitals:   01/18/22 2248 01/18/22 2248 01/19/22 0318 01/19/22 0808  BP:  129/68 112/60 138/74  Pulse:  77 63 77  Resp:  20 18   Temp:  98.2 F (36.8 C) 97.6 F (36.4 C)   TempSrc:  Oral Oral   SpO2:  91% 98%   Weight: 74.1 kg     Height: '5\' 5"'$  (1.651 m)       Intake/Output Summary (Last 24 hours) at 01/19/2022 1535 Last data filed at 01/19/2022 1300 Gross per 24 hour  Intake 1100 ml  Output 600 ml  Net 500 ml   Filed Weights   01/18/22 1529 01/18/22 2248  Weight: 77.1 kg 74.1 kg     Exam General exam: Appears calm and comfortable  Respiratory system: Clear to auscultation. Respiratory effort normal. Cardiovascular system: S1 & S2 heard, RRR. No JVD,  No pedal edema. Gastrointestinal system: Abdomen is nondistended, soft and nontender.  Central nervous system: Alert and oriented. No focal neurological deficits. Extremities: Symmetric 5 x 5 power. Skin: No rashes,  Psychiatry:Mood & affect appropriate.    Data Reviewed:  I have personally reviewed following labs and imaging studies   CBC Lab Results  Component Value Date   WBC 18.0 (H) 01/19/2022   RBC 3.61 (L) 01/19/2022   HGB 9.5 (L) 01/19/2022   HCT 31.0 (L)  01/19/2022   MCV 85.9 01/19/2022   MCH 26.3 01/19/2022   PLT 252 01/19/2022   MCHC 30.6 01/19/2022   RDW 14.6 01/19/2022   LYMPHSABS 1.6  01/18/2022   MONOABS 1.4 (H) 01/18/2022   EOSABS 0.0 01/18/2022   BASOSABS 0.1 58/85/0277     Last metabolic panel Lab Results  Component Value Date   NA 137 01/19/2022   K 4.3 01/19/2022   CL 107 01/19/2022   CO2 24 01/19/2022   BUN 14 01/19/2022   CREATININE 1.13 (H) 01/19/2022   GLUCOSE 118 (H) 01/19/2022   GFRNONAA 54 (L) 01/19/2022   GFRAA 61 03/06/2020   CALCIUM 8.6 (L) 01/19/2022   PROT 5.8 (L) 01/19/2022   ALBUMIN 2.4 (L) 01/19/2022   LABGLOB 2.1 03/01/2019   AGRATIO 1.5 03/01/2019   BILITOT 0.4 01/19/2022   ALKPHOS 58 01/19/2022   AST 107 (H) 01/19/2022   ALT 135 (H) 01/19/2022   ANIONGAP 6 01/19/2022    CBG (last 3)  Recent Labs    01/18/22 2256 01/19/22 0759 01/19/22 1238  GLUCAP 85 103* 264*      Coagulation Profile: Recent Labs  Lab 01/18/22 1511  INR 1.3*     Radiology Studies: CT Angio Chest PE W and/or Wo Contrast  Result Date: 01/18/2022 CLINICAL DATA:  Increasing fatigue/weakness, cough since Christmas Eve. Pulmonary embolism (PE) suspected, high prob; Abdominal pain, acute, nonlocalized RUQ pain EXAM: CT ANGIOGRAPHY CHEST CT ABDOMEN AND PELVIS WITH CONTRAST TECHNIQUE: Multidetector CT imaging of the chest was performed using the standard protocol during bolus administration of intravenous contrast. Multiplanar CT image reconstructions and MIPs were obtained to evaluate the vascular anatomy. Multidetector CT imaging of the abdomen and pelvis was performed using the standard protocol during bolus administration of intravenous contrast. RADIATION DOSE REDUCTION: This exam was performed according to the departmental dose-optimization program which includes automated exposure control, adjustment of the mA and/or kV according to patient size and/or use of iterative reconstruction technique. CONTRAST:  70m  OMNIPAQUE IOHEXOL 350 MG/ML SOLN COMPARISON:  None Available. FINDINGS: CTA CHEST FINDINGS Cardiovascular: No pulmonary embolus. Main pulmonary artery is normal in caliber. No evidence of thoracic aortic aneurysm or dissection. Normal heart size. Trace pericardial effusion. Moderate atherosclerotic plaque of the thoracic aorta. At least 3 vessel coronary artery calcifications. Mediastinum/Nodes: Borderline enlarged right hilar lymph node measuring up to 1 cm (4:21). No enlarged mediastinal, left hilar, or axillary lymph nodes. Right thyroid gland subcentimeter hypodensity. Not clinically significant; no follow-up imaging recommended (ref: J Am Coll Radiol. 2015 Feb;12(2): 143-50). Trachea and esophagus demonstrate no significant findings. Tiny hiatal hernia. Lungs/Pleura: Left lower lobe base peribronchovascular consolidation with air bronchograms. 6 mm left upper lobe subpleural nodule (4:24). No pulmonary mass. No pleural effusion. No pneumothorax. Musculoskeletal: No chest wall abnormality. No suspicious lytic or blastic osseous lesions. No acute displaced fracture. Degenerative changes of the left shoulder. Review of the MIP images confirms the above findings. CT ABDOMEN and PELVIS FINDINGS Hepatobiliary: Couple pericentimeter hypodense lesions within the right hepatic lobe likely represent simple hepatic cysts. No gallstones, gallbladder wall thickening, or pericholecystic fluid. No biliary dilatation. Pancreas: No focal lesion. Normal pancreatic contour. No surrounding inflammatory changes. No main pancreatic ductal dilatation. Spleen: Normal in size without focal abnormality.  Exophytic right Adrenals/Urinary Tract: No adrenal nodule bilaterally. Bilateral kidneys enhance symmetrically. Exophytic superior as well as inferior right renal pole fluid dense lesions likely represent simple renal cysts. Similar finding on the left (4:70). Simple renal cysts, in the absence of clinically indicated signs/symptoms,  require no independent follow-up. No hydronephrosis. No hydroureter. The urinary bladder is unremarkable. On delayed imaging, there is no urothelial wall thickening and there are no filling  defects in the opacified portions of the bilateral collecting systems or ureters. Stomach/Bowel: Stomach is within normal limits. No evidence of bowel wall thickening or dilatation. Appendix appears normal. Vascular/Lymphatic: No abdominal aorta or iliac aneurysm. Severe atherosclerotic plaque of the aorta and its branches. No abdominal, pelvic, or inguinal lymphadenopathy. Reproductive: Lobulated uterus with a right intramural 4.9 cm hyperdense lesion likely representing a fibroid. Other coarsely calcified lesions within the uterus likely represent degenerative uterine fibroids. Endometrium not visualized. Bilateral adnexa are unremarkable. Pessary device noted. Other: No intraperitoneal free fluid. No intraperitoneal free gas. No organized fluid collection. Musculoskeletal: Large supraumbilical fat containing ventral wall hernia with an abdominal defect of 1.8 cm. Small fat containing umbilical hernia with an abdominal defect of 1 cm. No suspicious lytic or blastic osseous lesions. No acute displaced fracture. Severe degenerative changes and deformity of the left hip. Review of the MIP images confirms the above findings. IMPRESSION: 1. No pulmonary embolus. 2. Left lower lobe base peribronchovascular consolidation with air bronchograms suggestive of infection/inflammation. Recommend follow-up CT in 3 months to evaluate for resolution and exclude underlying mass lesion. 3. A 7 mm left upper lobe subpleural nodule. Non-contrast chest CT at 6-12 months is recommended. If the nodule is stable at time of repeat CT, then future CT at 18-24 months (from today's scan) is considered optional for low-risk patients, but is recommended for high-risk patients. This recommendation follows the consensus statement: Guidelines for Management of  Incidental Pulmonary Nodules Detected on CT Images: From the Fleischner Society 2017; Radiology 2017; 284:228-243. 4. Indeterminate borderline enlarged right hilar lymph node. Recommend attention on follow-up. 5. Tiny hiatal hernia. 6. Uterine fibroids. 7. Colonic diverticulosis with no acute diverticulitis. 8. Severe degenerative changes and deformity of the left hip. 9. Aortic Atherosclerosis (ICD10-I70.0) including at least 3 vessel coronary calcification. 10. Supraumbilical and umbilical fat contained ventral wall hernias with no findings to suggest incarceration. Electronically Signed   By: Iven Finn M.D.   On: 01/18/2022 19:53   CT ABDOMEN PELVIS W CONTRAST  Result Date: 01/18/2022 CLINICAL DATA:  Increasing fatigue/weakness, cough since Christmas Eve. Pulmonary embolism (PE) suspected, high prob; Abdominal pain, acute, nonlocalized RUQ pain EXAM: CT ANGIOGRAPHY CHEST CT ABDOMEN AND PELVIS WITH CONTRAST TECHNIQUE: Multidetector CT imaging of the chest was performed using the standard protocol during bolus administration of intravenous contrast. Multiplanar CT image reconstructions and MIPs were obtained to evaluate the vascular anatomy. Multidetector CT imaging of the abdomen and pelvis was performed using the standard protocol during bolus administration of intravenous contrast. RADIATION DOSE REDUCTION: This exam was performed according to the departmental dose-optimization program which includes automated exposure control, adjustment of the mA and/or kV according to patient size and/or use of iterative reconstruction technique. CONTRAST:  50m OMNIPAQUE IOHEXOL 350 MG/ML SOLN COMPARISON:  None Available. FINDINGS: CTA CHEST FINDINGS Cardiovascular: No pulmonary embolus. Main pulmonary artery is normal in caliber. No evidence of thoracic aortic aneurysm or dissection. Normal heart size. Trace pericardial effusion. Moderate atherosclerotic plaque of the thoracic aorta. At least 3 vessel coronary  artery calcifications. Mediastinum/Nodes: Borderline enlarged right hilar lymph node measuring up to 1 cm (4:21). No enlarged mediastinal, left hilar, or axillary lymph nodes. Right thyroid gland subcentimeter hypodensity. Not clinically significant; no follow-up imaging recommended (ref: J Am Coll Radiol. 2015 Feb;12(2): 143-50). Trachea and esophagus demonstrate no significant findings. Tiny hiatal hernia. Lungs/Pleura: Left lower lobe base peribronchovascular consolidation with air bronchograms. 6 mm left upper lobe subpleural nodule (4:24). No pulmonary mass. No pleural effusion.  No pneumothorax. Musculoskeletal: No chest wall abnormality. No suspicious lytic or blastic osseous lesions. No acute displaced fracture. Degenerative changes of the left shoulder. Review of the MIP images confirms the above findings. CT ABDOMEN and PELVIS FINDINGS Hepatobiliary: Couple pericentimeter hypodense lesions within the right hepatic lobe likely represent simple hepatic cysts. No gallstones, gallbladder wall thickening, or pericholecystic fluid. No biliary dilatation. Pancreas: No focal lesion. Normal pancreatic contour. No surrounding inflammatory changes. No main pancreatic ductal dilatation. Spleen: Normal in size without focal abnormality.  Exophytic right Adrenals/Urinary Tract: No adrenal nodule bilaterally. Bilateral kidneys enhance symmetrically. Exophytic superior as well as inferior right renal pole fluid dense lesions likely represent simple renal cysts. Similar finding on the left (4:70). Simple renal cysts, in the absence of clinically indicated signs/symptoms, require no independent follow-up. No hydronephrosis. No hydroureter. The urinary bladder is unremarkable. On delayed imaging, there is no urothelial wall thickening and there are no filling defects in the opacified portions of the bilateral collecting systems or ureters. Stomach/Bowel: Stomach is within normal limits. No evidence of bowel wall thickening or  dilatation. Appendix appears normal. Vascular/Lymphatic: No abdominal aorta or iliac aneurysm. Severe atherosclerotic plaque of the aorta and its branches. No abdominal, pelvic, or inguinal lymphadenopathy. Reproductive: Lobulated uterus with a right intramural 4.9 cm hyperdense lesion likely representing a fibroid. Other coarsely calcified lesions within the uterus likely represent degenerative uterine fibroids. Endometrium not visualized. Bilateral adnexa are unremarkable. Pessary device noted. Other: No intraperitoneal free fluid. No intraperitoneal free gas. No organized fluid collection. Musculoskeletal: Large supraumbilical fat containing ventral wall hernia with an abdominal defect of 1.8 cm. Small fat containing umbilical hernia with an abdominal defect of 1 cm. No suspicious lytic or blastic osseous lesions. No acute displaced fracture. Severe degenerative changes and deformity of the left hip. Review of the MIP images confirms the above findings. IMPRESSION: 1. No pulmonary embolus. 2. Left lower lobe base peribronchovascular consolidation with air bronchograms suggestive of infection/inflammation. Recommend follow-up CT in 3 months to evaluate for resolution and exclude underlying mass lesion. 3. A 7 mm left upper lobe subpleural nodule. Non-contrast chest CT at 6-12 months is recommended. If the nodule is stable at time of repeat CT, then future CT at 18-24 months (from today's scan) is considered optional for low-risk patients, but is recommended for high-risk patients. This recommendation follows the consensus statement: Guidelines for Management of Incidental Pulmonary Nodules Detected on CT Images: From the Fleischner Society 2017; Radiology 2017; 284:228-243. 4. Indeterminate borderline enlarged right hilar lymph node. Recommend attention on follow-up. 5. Tiny hiatal hernia. 6. Uterine fibroids. 7. Colonic diverticulosis with no acute diverticulitis. 8. Severe degenerative changes and deformity of  the left hip. 9. Aortic Atherosclerosis (ICD10-I70.0) including at least 3 vessel coronary calcification. 10. Supraumbilical and umbilical fat contained ventral wall hernias with no findings to suggest incarceration. Electronically Signed   By: Iven Finn M.D.   On: 01/18/2022 19:53   DG Chest Port 1 View  Result Date: 01/18/2022 CLINICAL DATA:  Weakness, cough EXAM: PORTABLE CHEST 1 VIEW COMPARISON:  02/16/2009 FINDINGS: Heart size within normal limits. Aortic atherosclerosis. Low lung volumes with streaky bibasilar opacities. No pleural effusion or pneumothorax. Advanced degenerative changes of the left glenohumeral joint. IMPRESSION: Low lung volumes with streaky bibasilar opacities, likely atelectasis. Electronically Signed   By: Davina Poke D.O.   On: 01/18/2022 15:55       Hosie Poisson M.D. [facility] 01/19/2022, 3:35 PM  Available via Epic secure chat 7am-7pm After 7  pm, please refer to night coverage provider listed on amion.

## 2022-01-19 NOTE — Progress Notes (Signed)
Mobility Specialist - Progress Note   01/19/22 1439  Mobility  Activity Ambulated with assistance to bathroom  Level of Assistance Modified independent, requires aide device or extra time  Assistive Device Front wheel walker  Distance Ambulated (ft) 10 ft  Range of Motion/Exercises Active  Activity Response Tolerated well  Mobility Referral Yes  $Mobility charge 1 Mobility   Pt was found sitting EOB and wanting assistance to ambulate to bathroom. At EOS returned to bed with necessities in reach.  Ferd Hibbs Mobility Specialist

## 2022-01-19 NOTE — Progress Notes (Signed)
Flutter valve given to pt. Pt knows and understands how to use. 

## 2022-01-19 NOTE — Progress Notes (Signed)
Mobility Specialist - Progress Note   01/19/22 1056  Mobility  Activity Ambulated with assistance in hallway  Level of Assistance Modified independent, requires aide device or extra time  Assistive Device Front wheel walker  Distance Ambulated (ft) 200 ft  Range of Motion/Exercises Active  Activity Response Tolerated well  Mobility Referral Yes  $Mobility charge 1 Mobility   Pt was found sitting EOB and agreeable to ambulate. Stated having some R leg soreness due to arthritis during ambulation. At EOS return to sit EOB with all necessities in reach and NT notified.  Ferd Hibbs Mobility Specialist

## 2022-01-19 NOTE — Plan of Care (Signed)
  Problem: Clinical Measurements: Goal: Ability to maintain clinical measurements within normal limits will improve Outcome: Progressing Goal: Will remain free from infection Outcome: Progressing   Problem: Activity: Goal: Risk for activity intolerance will decrease Outcome: Progressing   Problem: Safety: Goal: Ability to remain free from injury will improve Outcome: Progressing

## 2022-01-20 DIAGNOSIS — J9601 Acute respiratory failure with hypoxia: Secondary | ICD-10-CM | POA: Diagnosis not present

## 2022-01-20 DIAGNOSIS — J189 Pneumonia, unspecified organism: Secondary | ICD-10-CM | POA: Diagnosis not present

## 2022-01-20 DIAGNOSIS — E1159 Type 2 diabetes mellitus with other circulatory complications: Secondary | ICD-10-CM | POA: Diagnosis not present

## 2022-01-20 DIAGNOSIS — A419 Sepsis, unspecified organism: Secondary | ICD-10-CM | POA: Diagnosis not present

## 2022-01-20 LAB — CBC WITH DIFFERENTIAL/PLATELET
Abs Immature Granulocytes: 0.42 10*3/uL — ABNORMAL HIGH (ref 0.00–0.07)
Basophils Absolute: 0.1 10*3/uL (ref 0.0–0.1)
Basophils Relative: 1 %
Eosinophils Absolute: 0.3 10*3/uL (ref 0.0–0.5)
Eosinophils Relative: 3 %
HCT: 33 % — ABNORMAL LOW (ref 36.0–46.0)
Hemoglobin: 10.2 g/dL — ABNORMAL LOW (ref 12.0–15.0)
Immature Granulocytes: 4 %
Lymphocytes Relative: 22 %
Lymphs Abs: 2.5 10*3/uL (ref 0.7–4.0)
MCH: 26.5 pg (ref 26.0–34.0)
MCHC: 30.9 g/dL (ref 30.0–36.0)
MCV: 85.7 fL (ref 80.0–100.0)
Monocytes Absolute: 1 10*3/uL (ref 0.1–1.0)
Monocytes Relative: 9 %
Neutro Abs: 7 10*3/uL (ref 1.7–7.7)
Neutrophils Relative %: 61 %
Platelets: 302 10*3/uL (ref 150–400)
RBC: 3.85 MIL/uL — ABNORMAL LOW (ref 3.87–5.11)
RDW: 14.8 % (ref 11.5–15.5)
WBC: 11.4 10*3/uL — ABNORMAL HIGH (ref 4.0–10.5)
nRBC: 0 % (ref 0.0–0.2)

## 2022-01-20 LAB — BASIC METABOLIC PANEL
Anion gap: 9 (ref 5–15)
BUN: 17 mg/dL (ref 8–23)
CO2: 22 mmol/L (ref 22–32)
Calcium: 8.7 mg/dL — ABNORMAL LOW (ref 8.9–10.3)
Chloride: 106 mmol/L (ref 98–111)
Creatinine, Ser: 1.05 mg/dL — ABNORMAL HIGH (ref 0.44–1.00)
GFR, Estimated: 59 mL/min — ABNORMAL LOW (ref >60)
Glucose, Bld: 172 mg/dL — ABNORMAL HIGH (ref 70–99)
Potassium: 4.1 mmol/L (ref 3.5–5.1)
Sodium: 137 mmol/L (ref 135–145)

## 2022-01-20 LAB — GLUCOSE, CAPILLARY
Glucose-Capillary: 168 mg/dL — ABNORMAL HIGH (ref 70–99)
Glucose-Capillary: 306 mg/dL — ABNORMAL HIGH (ref 70–99)
Glucose-Capillary: 323 mg/dL — ABNORMAL HIGH (ref 70–99)
Glucose-Capillary: 301 mg/dL — ABNORMAL HIGH (ref 70–99)

## 2022-01-20 LAB — LEGIONELLA PNEUMOPHILA SEROGP 1 UR AG: L. pneumophila Serogp 1 Ur Ag: NEGATIVE

## 2022-01-20 LAB — MAGNESIUM: Magnesium: 2.1 mg/dL (ref 1.7–2.4)

## 2022-01-20 MED ORDER — INSULIN GLARGINE-YFGN 100 UNIT/ML ~~LOC~~ SOLN
40.0000 [IU] | Freq: Every day | SUBCUTANEOUS | Status: DC
  Administered 2022-01-21: 40 [IU] via SUBCUTANEOUS
  Filled 2022-01-20: qty 0.4

## 2022-01-20 MED ORDER — INSULIN ASPART 100 UNIT/ML IJ SOLN
3.0000 [IU] | Freq: Three times a day (TID) | INTRAMUSCULAR | Status: DC
  Administered 2022-01-20 – 2022-01-21 (×2): 3 [IU] via SUBCUTANEOUS

## 2022-01-20 MED ORDER — CYANOCOBALAMIN 1000 MCG/ML IJ SOLN
1000.0000 ug | Freq: Every day | INTRAMUSCULAR | Status: DC
  Administered 2022-01-20 – 2022-01-21 (×2): 1000 ug via INTRAMUSCULAR
  Filled 2022-01-20 (×2): qty 1

## 2022-01-20 MED ORDER — FERROUS SULFATE 325 (65 FE) MG PO TABS
325.0000 mg | ORAL_TABLET | Freq: Two times a day (BID) | ORAL | Status: DC
  Administered 2022-01-20 – 2022-01-21 (×2): 325 mg via ORAL
  Filled 2022-01-20 (×2): qty 1

## 2022-01-20 MED ORDER — INSULIN ASPART 100 UNIT/ML IJ SOLN
0.0000 [IU] | Freq: Three times a day (TID) | INTRAMUSCULAR | Status: DC
  Administered 2022-01-20 – 2022-01-21 (×2): 11 [IU] via SUBCUTANEOUS
  Administered 2022-01-21: 2 [IU] via SUBCUTANEOUS

## 2022-01-20 NOTE — Evaluation (Signed)
Occupational Therapy Evaluation and Discharge Patient Details Name: Holly Acosta MRN: [id number] DOB: 1955/08/14 Today's Date: 01/20/2022   History of Present Illness 67 y.o. female with medical history significant for CAD s/p DES LCx 2019, history of CVA, T2DM, HTN, HLD, CKD stage IIIa, OSA on BiPAP who is admitted with sepsis due to left lower lobe pneumonia.   Clinical Impression   This 67 yo female admitted for above presents to acute OT at Mod I level from RW level for basic ADLs. She has two grandchildren at home 62 and 84 yo than can help prn when home from school/work. No further OT needs, we will sign off.      Recommendations for follow up therapy are one component of a multi-disciplinary discharge planning process, led by the attending physician.  Recommendations may be updated based on patient status, additional functional criteria and insurance authorization.   Follow Up Recommendations  No OT follow up     Assistance Recommended at Discharge None     Functional Status Assessment  Patient has not had a recent decline in their functional status  Equipment Recommendations  None recommended by OT       Precautions / Restrictions Precautions Precautions: Fall Restrictions Weight Bearing Restrictions: No      Mobility Bed Mobility Overal bed mobility: Modified Independent             General bed mobility comments: HOB up    Transfers Overall transfer level: Modified independent Equipment used: Rolling walker (2 wheels)                      Balance Overall balance assessment: Mild deficits observed, not formally tested                                         ADL either performed or assessed with clinical judgement   ADL Overall ADL's : Modified independent                                             Vision Patient Visual Report: No change from baseline              Pertinent Vitals/Pain Pain  Assessment Pain Assessment: Faces Faces Pain Scale: Hurts a little bit Pain Location: bil knees-chronic/OA Pain Descriptors / Indicators: Aching Pain Intervention(s): Limited activity within patient's tolerance, Monitored during session, Repositioned     Hand Dominance Right   Extremity/Trunk Assessment Upper Extremity Assessment Upper Extremity Assessment: Overall WFL for tasks assessed      Communication Communication Communication: No difficulties   Cognition Arousal/Alertness: Awake/alert Behavior During Therapy: WFL for tasks assessed/performed Overall Cognitive Status: Within Functional Limits for tasks assessed                                                  Home Living Family/patient expects to be discharged to:: Private residence Living Arrangements: Other relatives (grandchildren) Available Help at Discharge: Family;Available PRN/intermittently (16 and 81 yo grandchildren) Type of Home: House Home Access: Ramped entrance     Home Layout: One level     Bathroom Shower/Tub: Walk-in shower  Bathroom Toilet: Standard     Home Equipment: Conservation officer, nature (2 wheels);Rollator (4 wheels);Shower seat          Prior Functioning/Environment Prior Level of Function : Independent/Modified Independent                                 OT Goals(Current goals can be found in the care plan section) Acute Rehab OT Goals Patient Stated Goal: to go home soon         AM-PAC OT "6 Clicks" Daily Activity     Outcome Measure Help from another person eating meals?: None Help from another person taking care of personal grooming?: None Help from another person toileting, which includes using toliet, bedpan, or urinal?: None Help from another person bathing (including washing, rinsing, drying)?: None Help from another person to put on and taking off regular upper body clothing?: None Help from another person to put on and taking off regular  lower body clothing?: None 6 Click Score: 24   End of Session Equipment Utilized During Treatment: Rolling walker (2 wheels) Nurse Communication: Mobility status (if off IV would be safe to get up and about to bathroom by hersefl)  Activity Tolerance: Patient tolerated treatment well Patient left: in bed;with call bell/phone within reach;with bed alarm set                   Time: 9977-4142 OT Time Calculation (min): 16 min Charges:  OT General Charges $OT Visit: 1 Visit OT Evaluation $OT Eval Low Complexity: 1 Low  [place] [address], OTR/L [facility] [phone] Office [phone]    Almon Register 01/20/2022, 3:32 PM

## 2022-01-20 NOTE — Progress Notes (Signed)
Patient's CBGs have been over 300 x2 this shift. 306 before lunch and 323 before dinner. MD Karleen Hampshire paged to be made aware. See new orders.

## 2022-01-20 NOTE — Progress Notes (Signed)
[facility]                                                                               Holly Acosta, is a 67 y.o. female, DOB - 1955/09/11, CXK:[id number] Admit date - 01/18/2022    Outpatient Primary MD for the patient is Ganta, Anupa, DO  LOS - 2  days    Brief summary   Holly Acosta is a 67 y.o. female with medical history significant for CAD s/p DES LCx 2019, history of CVA, T2DM, HTN, HLD, CKD stage IIIa, OSA on BiPAP who is admitted with sepsis due to left lower lobe pneumonia.   Assessment & Plan    Assessment and Plan: * Sepsis due to left lower lobe pneumonia ([place]) Presenting with fever, leukocytosis, tachypnea and CT findings of left lower lobe pneumonia. Started on IV antibiotics.  Continue the same, pro calcitonin is 0.59 Urine strep is negative.  COVID, FLU, RSV pcr negative.  Continue with IV fluids.  Wean her off oxygen.  Sputum cultures ordered , pending.  Coronary artery disease involving native coronary artery of native heart with unstable angina pectoris ([place])  CAD s/p DES LCx 2019,  Denies chest pain.  Continue aspirin, Coreg.   Statin and Zetia on hold for now with elevated LFTs.  Acute respiratory failure with hypoxia (HCC) Secondary to pneumonia.  SpO2 88% on room air on arrival.  Has been requiring up to 4 L O2 via [place] to maintain SpO2 >90%. Wean oxygen as tolerated.   Insulin dependent type 2 diabetes mellitus (HCC) CBG (last 3)  Recent Labs    01/19/22 2116 01/20/22 0732 01/20/22 1117  GLUCAP 318* 168* 306*    Resume SSI.  Continue with semglee and SSI. Get A1c.   Elevated LFTs Mild AST and ALT elevation likely hypoperfusion from sepsis with hypovolemia.   CT negative for gallstones, gallbladder wall thickening, pericholecystic fluid, or biliary dilatation.   Centimeter hypodense lesions within the right hepatic lobe were noted and felt to represent simple hepatic cyst. Holding statins for elevated liver enzymes.  They  are improving. Repeat levels in am.    Lung nodule seen on imaging study A 7 mm left upper lobe subpleural nodule was seen on CT imaging.   Non contrast chest CT at 6-12 months is recommended.   Chronic kidney disease, stage 3a (HCC) Creatinine is 1.2 on admission, slightly improved to 1.1 to 1.05.   Hypertension associated with diabetes ([place]) Better controlled.    OSA (obstructive sleep apnea) Continue BiPAP nightly.  Hyperlipidemia associated with type 2 diabetes mellitus ([place]) Hold statin and Zetia for now with elevated LFTs.   Anemia of chronic disease Iron and VIT b12 deficiency on anemia panel.  - hemoglobin of 9.5 - vitamin B12 supplementation, iron supplementation added.       Estimated body mass index is 27.18 kg/m as calculated from the following:   Height as of this encounter: '5\' 5"'$  (1.651 m).   Weight as of this encounter: 74.1 kg.  Code Status: full code.  DVT Prophylaxis:  enoxaparin (LOVENOX) injection 40 mg Start: 01/19/22 1000   Level of Care: Level of care: Progressive Family  Communication: none at bedside  Disposition Plan:     Remains inpatient appropriate:  iv antibiotics.   Procedures:  None.   Consultants:   None.   Antimicrobials:   Anti-infectives (From admission, onward)    Start     Dose/Rate Route Frequency Ordered Stop   01/19/22 1200  azithromycin (ZITHROMAX) 500 mg in sodium chloride 0.9 % 250 mL IVPB        500 mg 250 mL/hr over 60 Minutes Intravenous Every 24 hours 01/18/22 2227     01/19/22 1200  cefTRIAXone (ROCEPHIN) 1 g in sodium chloride 0.9 % 100 mL IVPB        1 g 200 mL/hr over 30 Minutes Intravenous Every 24 hours 01/18/22 2227     01/18/22 1545  cefTRIAXone (ROCEPHIN) 1 g in sodium chloride 0.9 % 100 mL IVPB        1 g 200 mL/hr over 30 Minutes Intravenous  Once 01/18/22 1538 01/18/22 1714   01/18/22 1545  azithromycin (ZITHROMAX) 500 mg in sodium chloride 0.9 % 250 mL IVPB        500 mg 250 mL/hr over 60  Minutes Intravenous  Once 01/18/22 1538 01/18/22 1931        Medications  Scheduled Meds:  amLODipine  5 mg Oral Daily   aspirin EC  81 mg Oral Daily   carvedilol  12.5 mg Oral BID WC   cyanocobalamin  1,000 mcg Intramuscular Daily   enoxaparin (LOVENOX) injection  40 mg Subcutaneous Q24H   ferrous sulfate  325 mg Oral BID WC   guaiFENesin  600 mg Oral BID   hydrocortisone cream   Topical QID   insulin aspart  0-5 Units Subcutaneous QHS   insulin aspart  0-9 Units Subcutaneous TID WC   insulin glargine-yfgn  30 Units Subcutaneous Daily   lisinopril  10 mg Oral Daily   Continuous Infusions:  azithromycin 500 mg (01/19/22 1344)   cefTRIAXone (ROCEPHIN)  IV 1 g (01/19/22 1307)   PRN Meds:.acetaminophen **OR** acetaminophen, albuterol, guaiFENesin-dextromethorphan, ondansetron **OR** ondansetron (ZOFRAN) IV, senna-docusate    Subjective:   Holly Acosta was seen and examined today.   Breathing, and cough improving.    Objective:   Vitals:   01/19/22 1808 01/19/22 2118 01/19/22 2313 01/20/22 0444  BP: 123/82 122/64  134/63  Pulse:  83 74 72  Resp:  (!) 22 20 (!) 21  Temp:  98.1 F (36.7 C)  97.9 F (36.6 C)  TempSrc:  Oral  Oral  SpO2:  92% 94% 95%  Weight:      Height:        Intake/Output Summary (Last 24 hours) at 01/20/2022 1126 Last data filed at 01/20/2022 0900 Gross per 24 hour  Intake 480 ml  Output --  Net 480 ml    Filed Weights   01/18/22 1529 01/18/22 2248  Weight: 77.1 kg 74.1 kg     Exam General exam: Appears calm and comfortable  Respiratory system: Clear to auscultation. Respiratory effort normal. Cardiovascular system: S1 & S2 heard, RRR. No JVD,  No pedal edema. Gastrointestinal system: Abdomen is nondistended, soft and nontender.  Normal bowel sounds heard. Central nervous system: Alert and oriented. No focal neurological deficits. Extremities: Symmetric 5 x 5 power. Skin: No rashes, lesions or ulcers Psychiatry:  Mood & affect  appropriate.     Data Reviewed:  I have personally reviewed following labs and imaging studies   CBC Lab Results  Component Value Date   WBC 11.4 (  H) 01/20/2022   RBC 3.85 (L) 01/20/2022   HGB 10.2 (L) 01/20/2022   HCT 33.0 (L) 01/20/2022   MCV 85.7 01/20/2022   MCH 26.5 01/20/2022   PLT 302 01/20/2022   MCHC 30.9 01/20/2022   RDW 14.8 01/20/2022   LYMPHSABS 2.5 01/20/2022   MONOABS 1.0 01/20/2022   EOSABS 0.3 01/20/2022   BASOSABS 0.1 20/94/7096     Last metabolic panel Lab Results  Component Value Date   NA 137 01/20/2022   K 4.1 01/20/2022   CL 106 01/20/2022   CO2 22 01/20/2022   BUN 17 01/20/2022   CREATININE 1.05 (H) 01/20/2022   GLUCOSE 172 (H) 01/20/2022   GFRNONAA 59 (L) 01/20/2022   GFRAA 61 03/06/2020   CALCIUM 8.7 (L) 01/20/2022   PROT 5.8 (L) 01/19/2022   ALBUMIN 2.4 (L) 01/19/2022   LABGLOB 2.1 03/01/2019   AGRATIO 1.5 03/01/2019   BILITOT 0.4 01/19/2022   ALKPHOS 58 01/19/2022   AST 107 (H) 01/19/2022   ALT 135 (H) 01/19/2022   ANIONGAP 9 01/20/2022    CBG (last 3)  Recent Labs    01/19/22 2116 01/20/22 0732 01/20/22 1117  GLUCAP 318* 168* 306*       Coagulation Profile: Recent Labs  Lab 01/18/22 1511  INR 1.3*      Radiology Studies: CT Angio Chest PE W and/or Wo Contrast  Result Date: 01/18/2022 CLINICAL DATA:  Increasing fatigue/weakness, cough since Christmas Eve. Pulmonary embolism (PE) suspected, high prob; Abdominal pain, acute, nonlocalized RUQ pain EXAM: CT ANGIOGRAPHY CHEST CT ABDOMEN AND PELVIS WITH CONTRAST TECHNIQUE: Multidetector CT imaging of the chest was performed using the standard protocol during bolus administration of intravenous contrast. Multiplanar CT image reconstructions and MIPs were obtained to evaluate the vascular anatomy. Multidetector CT imaging of the abdomen and pelvis was performed using the standard protocol during bolus administration of intravenous contrast. RADIATION DOSE REDUCTION: This  exam was performed according to the departmental dose-optimization program which includes automated exposure control, adjustment of the mA and/or kV according to patient size and/or use of iterative reconstruction technique. CONTRAST:  72m OMNIPAQUE IOHEXOL 350 MG/ML SOLN COMPARISON:  None Available. FINDINGS: CTA CHEST FINDINGS Cardiovascular: No pulmonary embolus. Main pulmonary artery is normal in caliber. No evidence of thoracic aortic aneurysm or dissection. Normal heart size. Trace pericardial effusion. Moderate atherosclerotic plaque of the thoracic aorta. At least 3 vessel coronary artery calcifications. Mediastinum/Nodes: Borderline enlarged right hilar lymph node measuring up to 1 cm (4:21). No enlarged mediastinal, left hilar, or axillary lymph nodes. Right thyroid gland subcentimeter hypodensity. Not clinically significant; no follow-up imaging recommended (ref: J Am Coll Radiol. 2015 Feb;12(2): 143-50). Trachea and esophagus demonstrate no significant findings. Tiny hiatal hernia. Lungs/Pleura: Left lower lobe base peribronchovascular consolidation with air bronchograms. 6 mm left upper lobe subpleural nodule (4:24). No pulmonary mass. No pleural effusion. No pneumothorax. Musculoskeletal: No chest wall abnormality. No suspicious lytic or blastic osseous lesions. No acute displaced fracture. Degenerative changes of the left shoulder. Review of the MIP images confirms the above findings. CT ABDOMEN and PELVIS FINDINGS Hepatobiliary: Couple pericentimeter hypodense lesions within the right hepatic lobe likely represent simple hepatic cysts. No gallstones, gallbladder wall thickening, or pericholecystic fluid. No biliary dilatation. Pancreas: No focal lesion. Normal pancreatic contour. No surrounding inflammatory changes. No main pancreatic ductal dilatation. Spleen: Normal in size without focal abnormality.  Exophytic right Adrenals/Urinary Tract: No adrenal nodule bilaterally. Bilateral kidneys enhance  symmetrically. Exophytic superior as well as inferior right renal pole fluid  dense lesions likely represent simple renal cysts. Similar finding on the left (4:70). Simple renal cysts, in the absence of clinically indicated signs/symptoms, require no independent follow-up. No hydronephrosis. No hydroureter. The urinary bladder is unremarkable. On delayed imaging, there is no urothelial wall thickening and there are no filling defects in the opacified portions of the bilateral collecting systems or ureters. Stomach/Bowel: Stomach is within normal limits. No evidence of bowel wall thickening or dilatation. Appendix appears normal. Vascular/Lymphatic: No abdominal aorta or iliac aneurysm. Severe atherosclerotic plaque of the aorta and its branches. No abdominal, pelvic, or inguinal lymphadenopathy. Reproductive: Lobulated uterus with a right intramural 4.9 cm hyperdense lesion likely representing a fibroid. Other coarsely calcified lesions within the uterus likely represent degenerative uterine fibroids. Endometrium not visualized. Bilateral adnexa are unremarkable. Pessary device noted. Other: No intraperitoneal free fluid. No intraperitoneal free gas. No organized fluid collection. Musculoskeletal: Large supraumbilical fat containing ventral wall hernia with an abdominal defect of 1.8 cm. Small fat containing umbilical hernia with an abdominal defect of 1 cm. No suspicious lytic or blastic osseous lesions. No acute displaced fracture. Severe degenerative changes and deformity of the left hip. Review of the MIP images confirms the above findings. IMPRESSION: 1. No pulmonary embolus. 2. Left lower lobe base peribronchovascular consolidation with air bronchograms suggestive of infection/inflammation. Recommend follow-up CT in 3 months to evaluate for resolution and exclude underlying mass lesion. 3. A 7 mm left upper lobe subpleural nodule. Non-contrast chest CT at 6-12 months is recommended. If the nodule is stable at  time of repeat CT, then future CT at 18-24 months (from today's scan) is considered optional for low-risk patients, but is recommended for high-risk patients. This recommendation follows the consensus statement: Guidelines for Management of Incidental Pulmonary Nodules Detected on CT Images: From the Fleischner Society 2017; Radiology 2017; 284:228-243. 4. Indeterminate borderline enlarged right hilar lymph node. Recommend attention on follow-up. 5. Tiny hiatal hernia. 6. Uterine fibroids. 7. Colonic diverticulosis with no acute diverticulitis. 8. Severe degenerative changes and deformity of the left hip. 9. Aortic Atherosclerosis (ICD10-I70.0) including at least 3 vessel coronary calcification. 10. Supraumbilical and umbilical fat contained ventral wall hernias with no findings to suggest incarceration. Electronically Signed   By: Iven Finn M.D.   On: 01/18/2022 19:53   CT ABDOMEN PELVIS W CONTRAST  Result Date: 01/18/2022 CLINICAL DATA:  Increasing fatigue/weakness, cough since Christmas Eve. Pulmonary embolism (PE) suspected, high prob; Abdominal pain, acute, nonlocalized RUQ pain EXAM: CT ANGIOGRAPHY CHEST CT ABDOMEN AND PELVIS WITH CONTRAST TECHNIQUE: Multidetector CT imaging of the chest was performed using the standard protocol during bolus administration of intravenous contrast. Multiplanar CT image reconstructions and MIPs were obtained to evaluate the vascular anatomy. Multidetector CT imaging of the abdomen and pelvis was performed using the standard protocol during bolus administration of intravenous contrast. RADIATION DOSE REDUCTION: This exam was performed according to the departmental dose-optimization program which includes automated exposure control, adjustment of the mA and/or kV according to patient size and/or use of iterative reconstruction technique. CONTRAST:  16m OMNIPAQUE IOHEXOL 350 MG/ML SOLN COMPARISON:  None Available. FINDINGS: CTA CHEST FINDINGS Cardiovascular: No  pulmonary embolus. Main pulmonary artery is normal in caliber. No evidence of thoracic aortic aneurysm or dissection. Normal heart size. Trace pericardial effusion. Moderate atherosclerotic plaque of the thoracic aorta. At least 3 vessel coronary artery calcifications. Mediastinum/Nodes: Borderline enlarged right hilar lymph node measuring up to 1 cm (4:21). No enlarged mediastinal, left hilar, or axillary lymph nodes. Right thyroid gland subcentimeter  hypodensity. Not clinically significant; no follow-up imaging recommended (ref: J Am Coll Radiol. 2015 Feb;12(2): 143-50). Trachea and esophagus demonstrate no significant findings. Tiny hiatal hernia. Lungs/Pleura: Left lower lobe base peribronchovascular consolidation with air bronchograms. 6 mm left upper lobe subpleural nodule (4:24). No pulmonary mass. No pleural effusion. No pneumothorax. Musculoskeletal: No chest wall abnormality. No suspicious lytic or blastic osseous lesions. No acute displaced fracture. Degenerative changes of the left shoulder. Review of the MIP images confirms the above findings. CT ABDOMEN and PELVIS FINDINGS Hepatobiliary: Couple pericentimeter hypodense lesions within the right hepatic lobe likely represent simple hepatic cysts. No gallstones, gallbladder wall thickening, or pericholecystic fluid. No biliary dilatation. Pancreas: No focal lesion. Normal pancreatic contour. No surrounding inflammatory changes. No main pancreatic ductal dilatation. Spleen: Normal in size without focal abnormality.  Exophytic right Adrenals/Urinary Tract: No adrenal nodule bilaterally. Bilateral kidneys enhance symmetrically. Exophytic superior as well as inferior right renal pole fluid dense lesions likely represent simple renal cysts. Similar finding on the left (4:70). Simple renal cysts, in the absence of clinically indicated signs/symptoms, require no independent follow-up. No hydronephrosis. No hydroureter. The urinary bladder is unremarkable. On  delayed imaging, there is no urothelial wall thickening and there are no filling defects in the opacified portions of the bilateral collecting systems or ureters. Stomach/Bowel: Stomach is within normal limits. No evidence of bowel wall thickening or dilatation. Appendix appears normal. Vascular/Lymphatic: No abdominal aorta or iliac aneurysm. Severe atherosclerotic plaque of the aorta and its branches. No abdominal, pelvic, or inguinal lymphadenopathy. Reproductive: Lobulated uterus with a right intramural 4.9 cm hyperdense lesion likely representing a fibroid. Other coarsely calcified lesions within the uterus likely represent degenerative uterine fibroids. Endometrium not visualized. Bilateral adnexa are unremarkable. Pessary device noted. Other: No intraperitoneal free fluid. No intraperitoneal free gas. No organized fluid collection. Musculoskeletal: Large supraumbilical fat containing ventral wall hernia with an abdominal defect of 1.8 cm. Small fat containing umbilical hernia with an abdominal defect of 1 cm. No suspicious lytic or blastic osseous lesions. No acute displaced fracture. Severe degenerative changes and deformity of the left hip. Review of the MIP images confirms the above findings. IMPRESSION: 1. No pulmonary embolus. 2. Left lower lobe base peribronchovascular consolidation with air bronchograms suggestive of infection/inflammation. Recommend follow-up CT in 3 months to evaluate for resolution and exclude underlying mass lesion. 3. A 7 mm left upper lobe subpleural nodule. Non-contrast chest CT at 6-12 months is recommended. If the nodule is stable at time of repeat CT, then future CT at 18-24 months (from today's scan) is considered optional for low-risk patients, but is recommended for high-risk patients. This recommendation follows the consensus statement: Guidelines for Management of Incidental Pulmonary Nodules Detected on CT Images: From the Fleischner Society 2017; Radiology 2017;  284:228-243. 4. Indeterminate borderline enlarged right hilar lymph node. Recommend attention on follow-up. 5. Tiny hiatal hernia. 6. Uterine fibroids. 7. Colonic diverticulosis with no acute diverticulitis. 8. Severe degenerative changes and deformity of the left hip. 9. Aortic Atherosclerosis (ICD10-I70.0) including at least 3 vessel coronary calcification. 10. Supraumbilical and umbilical fat contained ventral wall hernias with no findings to suggest incarceration. Electronically Signed   By: Iven Finn M.D.   On: 01/18/2022 19:53   DG Chest Port 1 View  Result Date: 01/18/2022 CLINICAL DATA:  Weakness, cough EXAM: PORTABLE CHEST 1 VIEW COMPARISON:  02/16/2009 FINDINGS: Heart size within normal limits. Aortic atherosclerosis. Low lung volumes with streaky bibasilar opacities. No pleural effusion or pneumothorax. Advanced degenerative changes of the  left glenohumeral joint. IMPRESSION: Low lung volumes with streaky bibasilar opacities, likely atelectasis. Electronically Signed   By: Davina Poke D.O.   On: 01/18/2022 15:55       Hosie Poisson M.D. [facility] 01/20/2022, 11:26 AM  Available via Epic secure chat 7am-7pm After 7 pm, please refer to night coverage provider listed on amion.

## 2022-01-20 NOTE — Evaluation (Signed)
Physical Therapy Evaluation Patient Details Name: Holly Acosta MRN: [id number] DOB: 03/30/1955 Today's Date: 01/20/2022  History of Present Illness  67 y.o. female with medical history significant for CAD s/p DES LCx 2019, history of CVA, T2DM, HTN, HLD, CKD stage IIIa, OSA on BiPAP who is admitted with sepsis due to left lower lobe pneumonia.  Clinical Impression  Patient evaluated by Physical Therapy with no further acute PT needs identified. All education has been completed and the patient has no further questions.  Pt is very pleasant and motivated to mobilize, she is mod I at baseline, uses a rollator d/t bil knee OA/pain. Pt reports her grandchildren will assist with household tasks if needed.  Amb ~ 52' with RW and supervision for safety with SpO2=99% on RA, HR 79; no dyspnea noted.  no f/u recommended; pt should continue to mobilize with staff/mobility techs.   See below for any follow-up Physical Therapy or equipment needs. PT is signing off. Thank you for this referral.        Recommendations for follow up therapy are one component of a multi-disciplinary discharge planning process, led by the attending physician.  Recommendations may be updated based on patient status, additional functional criteria and insurance authorization.  Follow Up Recommendations No PT follow up      Assistance Recommended at Discharge PRN  Patient can return home with the following  Help with stairs or ramp for entrance    Equipment Recommendations None recommended by PT  Recommendations for Other Services       Functional Status Assessment Patient has not had a recent decline in their functional status     Precautions / Restrictions Precautions Precautions: Fall Restrictions Weight Bearing Restrictions: No      Mobility  Bed Mobility Overal bed mobility: Modified Independent                  Transfers Overall transfer level: Modified independent Equipment used: Rolling walker  (2 wheels)                    Ambulation/Gait Ambulation/Gait assistance: Supervision Gait Distance (Feet): 220 Feet Assistive device: Rolling walker (2 wheels) Gait Pattern/deviations: Step-through pattern       General Gait Details: no overt LOB, supervision for safety  Stairs            Wheelchair Mobility    Modified Rankin (Stroke Patients Only)       Balance Overall balance assessment: Mild deficits observed, not formally tested                                           Pertinent Vitals/Pain Pain Assessment Pain Assessment: Faces Faces Pain Scale: Hurts a little bit Pain Location: bil knees-chronic/OA Pain Descriptors / Indicators: Aching Pain Intervention(s): Limited activity within patient's tolerance, Monitored during session, Repositioned    Home Living Family/patient expects to be discharged to:: Private residence Living Arrangements: Spouse/significant other Available Help at Discharge: Family;Available PRN/intermittently (67 yo and 58 yo grandchildren) Type of Home: House Home Access: Ramped entrance       Home Layout: One level Home Equipment: Conservation officer, nature (2 wheels);Rollator (4 wheels);Shower seat      Prior Function Prior Level of Function : Independent/Modified Independent                     Hand Dominance  Extremity/Trunk Assessment   Upper Extremity Assessment Upper Extremity Assessment: Defer to OT evaluation    Lower Extremity Assessment Lower Extremity Assessment: Overall WFL for tasks assessed (bil varus deformities d/t OA bil knees)       Communication   Communication: No difficulties  Cognition Arousal/Alertness: Awake/alert Behavior During Therapy: WFL for tasks assessed/performed Overall Cognitive Status: Within Functional Limits for tasks assessed                                          General Comments      Exercises     Assessment/Plan    PT  Assessment Patient does not need any further PT services  PT Problem List         PT Treatment Interventions      PT Goals (Current goals can be found in the Care Plan section)  Acute Rehab PT Goals PT Goal Formulation: All assessment and education complete, DC therapy    Frequency       Co-evaluation               AM-PAC PT "6 Clicks" Mobility  Outcome Measure Help needed turning from your back to your side while in a flat bed without using bedrails?: None Help needed moving from lying on your back to sitting on the side of a flat bed without using bedrails?: None Help needed moving to and from a bed to a chair (including a wheelchair)?: None Help needed standing up from a chair using your arms (e.g., wheelchair or bedside chair)?: None Help needed to walk in hospital room?: None Help needed climbing 3-5 steps with a railing? : A Little 6 Click Score: 23    End of Session   Activity Tolerance: Patient tolerated treatment well Patient left: in bed;with call bell/phone within reach;with bed alarm set   PT Visit Diagnosis: Difficulty in walking, not elsewhere classified (R26.2)    Time: 5056-9794 PT Time Calculation (min) (ACUTE ONLY): 12 min   Charges:   PT Evaluation $PT Eval Low Complexity: [place], PT  Acute Rehab Dept [facility]) [phone]  [facility] Weekend Pager (Saturday/Sunday only)  [phone]  01/20/2022   [facility] 01/20/2022, 12:08 PM

## 2022-01-21 DIAGNOSIS — J9601 Acute respiratory failure with hypoxia: Secondary | ICD-10-CM | POA: Diagnosis not present

## 2022-01-21 DIAGNOSIS — J189 Pneumonia, unspecified organism: Secondary | ICD-10-CM | POA: Diagnosis not present

## 2022-01-21 DIAGNOSIS — A419 Sepsis, unspecified organism: Secondary | ICD-10-CM | POA: Diagnosis not present

## 2022-01-21 DIAGNOSIS — E1159 Type 2 diabetes mellitus with other circulatory complications: Secondary | ICD-10-CM | POA: Diagnosis not present

## 2022-01-21 LAB — BASIC METABOLIC PANEL
Anion gap: 8 (ref 5–15)
BUN: 12 mg/dL (ref 8–23)
CO2: 22 mmol/L (ref 22–32)
Calcium: 8.5 mg/dL — ABNORMAL LOW (ref 8.9–10.3)
Chloride: 108 mmol/L (ref 98–111)
Creatinine, Ser: 0.98 mg/dL (ref 0.44–1.00)
GFR, Estimated: >60 mL/min (ref >60)
Glucose, Bld: 129 mg/dL — ABNORMAL HIGH (ref 70–99)
Potassium: 3.9 mmol/L (ref 3.5–5.1)
Sodium: 138 mmol/L (ref 135–145)

## 2022-01-21 LAB — CBC WITH DIFFERENTIAL/PLATELET
Abs Immature Granulocytes: 0.44 10*3/uL — ABNORMAL HIGH (ref 0.00–0.07)
Basophils Absolute: 0.1 10*3/uL (ref 0.0–0.1)
Basophils Relative: 1 %
Eosinophils Absolute: 0.3 10*3/uL (ref 0.0–0.5)
Eosinophils Relative: 3 %
HCT: 34.2 % — ABNORMAL LOW (ref 36.0–46.0)
Hemoglobin: 10.5 g/dL — ABNORMAL LOW (ref 12.0–15.0)
Immature Granulocytes: 5 %
Lymphocytes Relative: 31 %
Lymphs Abs: 2.6 10*3/uL (ref 0.7–4.0)
MCH: 26.1 pg (ref 26.0–34.0)
MCHC: 30.7 g/dL (ref 30.0–36.0)
MCV: 84.9 fL (ref 80.0–100.0)
Monocytes Absolute: 0.8 10*3/uL (ref 0.1–1.0)
Monocytes Relative: 9 %
Neutro Abs: 4.2 10*3/uL (ref 1.7–7.7)
Neutrophils Relative %: 51 %
Platelets: 335 10*3/uL (ref 150–400)
RBC: 4.03 MIL/uL (ref 3.87–5.11)
RDW: 14.8 % (ref 11.5–15.5)
WBC: 8.4 10*3/uL (ref 4.0–10.5)
nRBC: 0 % (ref 0.0–0.2)

## 2022-01-21 LAB — HEMOGLOBIN A1C
Hgb A1c MFr Bld: 14.1 % — ABNORMAL HIGH (ref 4.8–5.6)
Mean Plasma Glucose: 358 mg/dL

## 2022-01-21 LAB — HEPATIC FUNCTION PANEL
ALT: 103 U/L — ABNORMAL HIGH (ref 0–44)
AST: 48 U/L — ABNORMAL HIGH (ref 15–41)
Albumin: 2.6 g/dL — ABNORMAL LOW (ref 3.5–5.0)
Alkaline Phosphatase: 57 U/L (ref 38–126)
Bilirubin, Direct: 0.1 mg/dL (ref 0.0–0.2)
Indirect Bilirubin: 0.3 mg/dL (ref 0.3–0.9)
Total Bilirubin: 0.4 mg/dL (ref 0.3–1.2)
Total Protein: 6 g/dL — ABNORMAL LOW (ref 6.5–8.1)

## 2022-01-21 LAB — GLUCOSE, CAPILLARY
Glucose-Capillary: 144 mg/dL — ABNORMAL HIGH (ref 70–99)
Glucose-Capillary: 322 mg/dL — ABNORMAL HIGH (ref 70–99)

## 2022-01-21 MED ORDER — ROSUVASTATIN CALCIUM 40 MG PO TABS: 40.0000 mg | ORAL_TABLET | Freq: Every day | ORAL | 3 refills | Status: DC

## 2022-01-21 MED ORDER — SENNOSIDES-DOCUSATE SODIUM 8.6-50 MG PO TABS: 1.0000 | ORAL_TABLET | Freq: Every evening | ORAL | Status: DC | PRN

## 2022-01-21 MED ORDER — VITAMIN B-12 1000 MCG PO TABS: 1000.0000 ug | ORAL_TABLET | Freq: Every day | ORAL | 3 refills | Status: DC

## 2022-01-21 MED ORDER — INSULIN ASPART 100 UNIT/ML IJ SOLN
5.0000 [IU] | Freq: Three times a day (TID) | INTRAMUSCULAR | Status: DC
  Administered 2022-01-21: 5 [IU] via SUBCUTANEOUS

## 2022-01-21 MED ORDER — FERROUS SULFATE 325 (65 FE) MG PO TABS: 325.0000 mg | ORAL_TABLET | Freq: Two times a day (BID) | ORAL | 3 refills | Status: DC

## 2022-01-21 MED ORDER — GUAIFENESIN-DM 100-10 MG/5ML PO SYRP: 5.0000 mL | ORAL_SOLUTION | ORAL | 0 refills | Status: DC | PRN

## 2022-01-21 MED ORDER — AMOXICILLIN-POT CLAVULANATE 875-125 MG PO TABS: 1.0000 | ORAL_TABLET | Freq: Two times a day (BID) | ORAL | 0 refills | Status: AC

## 2022-01-21 NOTE — Care Management Important Message (Signed)
Important Message  Patient Details IM Letter given. Name: Holly Acosta MRN: [id number] Date of Birth: 1955-03-27   Medicare Important Message Given:  Yes     Kerin Salen 01/21/2022, 10:50 AM

## 2022-01-21 NOTE — Progress Notes (Signed)
.   Transition of Care [facility]) Screening Note   Patient Details  Name: Holly Acosta Date of Birth: 1955/05/16   Transition of Care [facility]) CM/SW Contact:    Illene Regulus, LCSW Phone Number: 01/21/2022, 12:41 PM    Transition of Care Department [facility]) has reviewed patient and no TOC needs have been identified at this time. We will continue to monitor patient advancement through interdisciplinary progression rounds. If new patient transition needs arise, please place a TOC consult.

## 2022-01-21 NOTE — Inpatient Diabetes Management (Signed)
Inpatient Diabetes Program Recommendations  AACE/ADA: New Consensus Statement on Inpatient Glycemic Control (2015)  Target Ranges:  Prepandial:   less than 140 mg/dL      Peak postprandial:   less than 180 mg/dL (1-2 hours)      Critically ill patients:  140 - 180 mg/dL   Lab Results  Component Value Date   GLUCAP 144 (H) 01/21/2022   HGBA1C 14.1 (H) 01/19/2022    Review of Glycemic Control  Latest Reference Range & Units 01/20/22 07:32 01/20/22 11:17 01/20/22 17:03 01/20/22 21:24 01/21/22 07:38  Glucose-Capillary 70 - 99 mg/dL 168 (H) 306 (H) 323 (H) 301 (H) 144 (H)  (H): Data is abnormally high  Diabetes history: DM2 Outpatient Diabetes medications: Lantus 30 units QD, Humalog 14 units TID, Farxiga 10 mg QD, Metformin 1000 mg BID Current orders for Inpatient glycemic control: Semglee 40 units QD (increased today from 30 units), Novolog 0-15 units TID and 0-5 units QHS, Novolog 3 units TID  Inpatient Diabetes Program Recommendations:    Fasting was 144 mg/dL this morning.  Please consider:  -Semglee 30 units QD -Increase meal coverage to Novolog 6 units TID  Will continue to follow while inpatient.  Thank you, Reche Dixon, MSN, [facility] Coordinator Inpatient Diabetes Program [phone] (team pager from 8a-5p)

## 2022-01-21 NOTE — Progress Notes (Deleted)
PHARMACIST - PHYSICIAN COMMUNICATION  CONCERNING: Antibiotic IV to Oral Route Change Policy  RECOMMENDATION: This patient is receiving azithromycin by the intravenous route.  Based on criteria approved by the Pharmacy and Therapeutics Committee, the antibiotic(s) is/are being converted to the equivalent oral dose form(s).   DESCRIPTION: These criteria include: Patient being treated for a respiratory tract infection, urinary tract infection, cellulitis or clostridium difficile associated diarrhea if on metronidazole The patient is not neutropenic and does not exhibit a GI malabsorption state The patient is eating (either orally or via tube) and/or has been taking other orally administered medications for a least 24 hours The patient is improving clinically and has a Tmax < 100.5  If you have questions about this conversion, please contact the [place], PharmD, BCPS Clinical Pharmacist 01/21/2022 12:10 PM

## 2022-01-22 ENCOUNTER — Telehealth: Payer: Self-pay

## 2022-01-22 LAB — CULTURE, RESPIRATORY W GRAM STAIN: Culture: NORMAL

## 2022-01-22 NOTE — Patient Outreach (Signed)
  Care Coordination TOC Note Transition Care Management Follow-up Telephone Call Date of discharge and from where: [facility] 01/18/22-01/21/22 How have you been since you were released from the hospital? "I am doing a lot better" Any questions or concerns? No  Items Reviewed: Did the pt receive and understand the discharge instructions provided? Yes  Medications obtained and verified? Yes  Other? No  Any new allergies since your discharge? No  Dietary orders reviewed? Yes Do you have support at home? Yes   Home Care and Equipment/Supplies: Were home health services ordered? no If so, what is the name of the agency? N/A  Has the agency set up a time to come to the patient's home? not applicable Were any new equipment or medical supplies ordered?  No What is the name of the medical supply agency? N/A Were you able to get the supplies/equipment? not applicable Do you have any questions related to the use of the equipment or supplies? No  Functional Questionnaire: (I = Independent and D = Dependent) ADLs: I  Bathing/Dressing- I  Meal Prep- I  Eating- I  Maintaining continence- I  Transferring/Ambulation- I  Managing Meds- I  Follow up appointments reviewed:  PCP Hospital f/u appt confirmed? No   Specialist Hospital f/u appt confirmed? No   Are transportation arrangements needed? No  If their condition worsens, is the pt aware to call PCP or go to the Emergency Dept.? Yes Was the patient provided with contact information for the PCP's office or ED? Yes Was to pt encouraged to call back with questions or concerns? Yes  SDOH assessments and interventions completed:   Yes SDOH Interventions Today    Flowsheet Row Most Recent Value  SDOH Interventions   Housing Interventions Intervention Not Indicated  Transportation Interventions Intervention Not Indicated       Care Coordination Interventions:  PCP follow up appointment requested   Encounter Outcome:  Pt. Visit  Completed

## 2022-01-23 LAB — CULTURE, BLOOD (ROUTINE X 2)
Culture: NO GROWTH
Culture: NO GROWTH
Special Requests: ADEQUATE

## 2022-01-27 NOTE — Discharge Summary (Signed)
Physician Discharge Summary   Patient: Holly Acosta MRN: [id number] DOB: [birth year]  Admit date:     01/18/2022  Discharge date: 01/21/2022  Discharge Physician: Hosie Poisson   PCP: Donney Dice, DO   Recommendations at discharge:  Please follow up with PCP in one week.   Discharge Diagnoses: Principal Problem:   Sepsis due to left lower lobe pneumonia (HCC) Active Problems:   Coronary artery disease involving native coronary artery of native heart with unstable angina pectoris (HCC)   Acute respiratory failure with hypoxia (HCC)   Elevated LFTs   Insulin dependent type 2 diabetes mellitus ([place])   Hyperlipidemia associated with type 2 diabetes mellitus (HCC)   OSA (obstructive sleep apnea)   Hypertension associated with diabetes ([place])   Chronic kidney disease, stage 3a (HCC)   Lung nodule seen on imaging study  Resolved Problems:   * No resolved hospital problems. [facility] Course: Holly Acosta is a 67 y.o. female with medical history significant for CAD s/p DES LCx 2019, history of CVA, T2DM, HTN, HLD, CKD stage IIIa, OSA on BiPAP who is admitted with sepsis due to left lower lobe pneumonia.    Assessment and Plan: * Sepsis due to left lower lobe pneumonia ([place]) Presenting with fever, leukocytosis, tachypnea and CT findings of left lower lobe pneumonia. Started on IV antibiotics, transitioned to oral antibiotics on discharge.  Urine strep is negative.  COVID, FLU, RSV pcr negative.  Wean her off oxygen.  Sputum cultures ordered , pending. Recommend follow up with PCP.    Coronary artery disease involving native coronary artery of native heart with unstable angina pectoris ([place])  CAD s/p DES LCx 2019,  Denies chest pain.  Continue aspirin, Coreg.   Statin and Zetia on hold for now with elevated LFTs.   Acute respiratory failure with hypoxia (HCC) Secondary to pneumonia.  SpO2 88% on room air on arrival.  Has been requiring up to 4 L O2  Weaned oxygen.    Insulin  dependent type 2 diabetes mellitus ([place]) Resume home meds on discharge.    Elevated LFTs Mild AST and ALT elevation likely hypoperfusion from sepsis with hypovolemia.   CT negative for gallstones, gallbladder wall thickening, pericholecystic fluid, or biliary dilatation.   Centimeter hypodense lesions within the right hepatic lobe were noted and felt to represent simple hepatic cyst. Holding statins for elevated liver enzymes.  Recommend checking them in 2 weeks.      Lung nodule seen on imaging study A 7 mm left upper lobe subpleural nodule was seen on CT imaging.   Non contrast chest CT at 6-12 months is recommended.    Chronic kidney disease, stage 3a (HCC) Creatinine is 1.2 on admission, slightly improved to 1.1 to 1.05.    Hypertension associated with diabetes ([place]) Better controlled.      OSA (obstructive sleep apnea) Continue CPAP at night   Hyperlipidemia associated with type 2 diabetes mellitus ([place]) Recommend restarting  statin and Zetia in one month due to with elevated LFTs. Recheck liver panel in 2 weeks and if wnl, recommend restarting statins      Anemia of chronic disease Iron and VIT b12 deficiency on anemia panel.  - hemoglobin of 9.5 - vitamin B12 supplementation, iron supplementation added.          Estimated body mass index is 27.18 kg/m as calculated from the following:   Height as of this encounter: '5\' 5"'$  (1.651 m).   Weight as of this encounter:  74.1 kg.        Consultants: none.   Procedures performed: none.   Disposition: Home Diet recommendation:  Discharge Diet Orders (From admission, onward)     Start     Ordered   01/21/22 0000  Diet - low sodium heart healthy        01/21/22 1113           Carb modified diet DISCHARGE MEDICATION: Allergies as of 01/21/2022       Reactions   Lipitor [atorvastatin] Other (See Comments)   Cramping in feet that resolved after stopping med   Other Nausea And Vomiting, Other (See Comments)    GLP-1: Trulicity, Ozempic, Victoza - vomiting and cramping    Diclofenac Nausea Only, Other (See Comments)   Caused a lot of stomach pain    Trulicity [dulaglutide] Nausea And Vomiting, Other (See Comments)   GLP-1: Trulicity, Ozempic, Victoza - vomiting and cramping         Medication List     TAKE these medications    Accu-Chek FastClix Lancets Misc 1 each by Does not apply route daily. Check sugar daily as needed   Accu-Chek Guide Me w/Device Kit PLEASE USE TO CHECK BLOOD SUGAR UP TO THREE TIMES DAILY.   Accu-Chek Guide test strip Generic drug: glucose blood Please use to check blood sugar up to three times daily.   acetaminophen 500 MG tablet Commonly known as: TYLENOL Take 1,000 mg by mouth daily as needed for mild pain.   amLODipine 5 MG tablet Commonly known as: NORVASC TAKE 1 TABLET BY MOUTH EVERY DAY   aspirin EC 81 MG tablet Take 1 tablet (81 mg total) by mouth daily.   B-D UF III MINI PEN NEEDLES 31G X 5 MM Misc Generic drug: Insulin Pen Needle USE AS DIRECTED 3 TIMES A DAY   carvedilol 12.5 MG tablet Commonly known as: COREG TAKE 1 TABLET BY MOUTH 2 TIMES DAILY WITH A MEAL.   cyanocobalamin 1000 MCG tablet Commonly known as: VITAMIN B12 Take 1 tablet (1,000 mcg total) by mouth daily.   dapagliflozin propanediol 10 MG Tabs tablet Commonly known as: FARXIGA Take 1 tablet (10 mg total) by mouth daily.   ezetimibe 10 MG tablet Commonly known as: ZETIA TAKE 1 TABLET BY MOUTH EVERY DAY   ferrous sulfate 325 (65 FE) MG tablet Take 1 tablet (325 mg total) by mouth 2 (two) times daily with a meal.   Fish Oil 1000 MG Caps Take 1,000 mg by mouth daily.   guaiFENesin-dextromethorphan 100-10 MG/5ML syrup Commonly known as: ROBITUSSIN DM Take 5 mLs by mouth every 4 (four) hours as needed for cough.   HumaLOG KwikPen 100 UNIT/ML KwikPen Generic drug: insulin lispro INJECT 14 UNITS INTO THE SKIN 3 (THREE) TIMES DAILY. What changed: See the new  instructions.   Lantus SoloStar 100 UNIT/ML Solostar Pen Generic drug: insulin glargine INJECT 32 UNITS INTO SKIN DAILY What changed:  how much to take how to take this when to take this additional instructions   lisinopril 10 MG tablet Commonly known as: ZESTRIL Take 1 tablet (10 mg total) by mouth daily.   metFORMIN 1000 MG tablet Commonly known as: GLUCOPHAGE Take 1 tablet (1,000 mg total) by mouth 2 (two) times daily with a meal.   rosuvastatin 40 MG tablet Commonly known as: CRESTOR Take 1 tablet (40 mg total) by mouth daily. Start taking on: February 20, 2022 What changed: These instructions start on February 20, 2022. If you are unsure  what to do until then, ask your doctor or other care provider.   senna-docusate 8.6-50 MG tablet Commonly known as: Senokot-S Take 1 tablet by mouth at bedtime as needed for mild constipation.       ASK your doctor about these medications    amoxicillin-clavulanate 875-125 MG tablet Commonly known as: AUGMENTIN Take 1 tablet by mouth 2 (two) times daily for 5 days. Ask about: Should I take this medication?        Discharge Exam: Filed Weights   01/18/22 1529 01/18/22 2248  Weight: 77.1 kg 74.1 kg   General exam: Appears calm and comfortable  Respiratory system: Clear to auscultation. Respiratory effort normal. Cardiovascular system: S1 & S2 heard, RRR. No JVD, murmurs, rubs, gallops or clicks. No pedal edema. Gastrointestinal system: Abdomen is nondistended, soft and nontender. No organomegaly or masses felt. Normal bowel sounds heard. Central nervous system: Alert and oriented. No focal neurological deficits. Extremities: Symmetric 5 x 5 power. Skin: No rashes, lesions or ulcers Psychiatry: Judgement and insight appear normal. Mood & affect appropriate.    Condition at discharge: fair  The results of significant diagnostics from this hospitalization (including imaging, microbiology, ancillary and laboratory) are listed  below for reference.   Imaging Studies: CT Angio Chest PE W and/or Wo Contrast  Result Date: 01/18/2022 CLINICAL DATA:  Increasing fatigue/weakness, cough since Christmas Eve. Pulmonary embolism (PE) suspected, high prob; Abdominal pain, acute, nonlocalized RUQ pain EXAM: CT ANGIOGRAPHY CHEST CT ABDOMEN AND PELVIS WITH CONTRAST TECHNIQUE: Multidetector CT imaging of the chest was performed using the standard protocol during bolus administration of intravenous contrast. Multiplanar CT image reconstructions and MIPs were obtained to evaluate the vascular anatomy. Multidetector CT imaging of the abdomen and pelvis was performed using the standard protocol during bolus administration of intravenous contrast. RADIATION DOSE REDUCTION: This exam was performed according to the departmental dose-optimization program which includes automated exposure control, adjustment of the mA and/or kV according to patient size and/or use of iterative reconstruction technique. CONTRAST:  75m OMNIPAQUE IOHEXOL 350 MG/ML SOLN COMPARISON:  None Available. FINDINGS: CTA CHEST FINDINGS Cardiovascular: No pulmonary embolus. Main pulmonary artery is normal in caliber. No evidence of thoracic aortic aneurysm or dissection. Normal heart size. Trace pericardial effusion. Moderate atherosclerotic plaque of the thoracic aorta. At least 3 vessel coronary artery calcifications. Mediastinum/Nodes: Borderline enlarged right hilar lymph node measuring up to 1 cm (4:21). No enlarged mediastinal, left hilar, or axillary lymph nodes. Right thyroid gland subcentimeter hypodensity. Not clinically significant; no follow-up imaging recommended (ref: J Am Coll Radiol. 2015 Feb;12(2): 143-50). Trachea and esophagus demonstrate no significant findings. Tiny hiatal hernia. Lungs/Pleura: Left lower lobe base peribronchovascular consolidation with air bronchograms. 6 mm left upper lobe subpleural nodule (4:24). No pulmonary mass. No pleural effusion. No  pneumothorax. Musculoskeletal: No chest wall abnormality. No suspicious lytic or blastic osseous lesions. No acute displaced fracture. Degenerative changes of the left shoulder. Review of the MIP images confirms the above findings. CT ABDOMEN and PELVIS FINDINGS Hepatobiliary: Couple pericentimeter hypodense lesions within the right hepatic lobe likely represent simple hepatic cysts. No gallstones, gallbladder wall thickening, or pericholecystic fluid. No biliary dilatation. Pancreas: No focal lesion. Normal pancreatic contour. No surrounding inflammatory changes. No main pancreatic ductal dilatation. Spleen: Normal in size without focal abnormality.  Exophytic right Adrenals/Urinary Tract: No adrenal nodule bilaterally. Bilateral kidneys enhance symmetrically. Exophytic superior as well as inferior right renal pole fluid dense lesions likely represent simple renal cysts. Similar finding on the left (4:70). Simple renal  cysts, in the absence of clinically indicated signs/symptoms, require no independent follow-up. No hydronephrosis. No hydroureter. The urinary bladder is unremarkable. On delayed imaging, there is no urothelial wall thickening and there are no filling defects in the opacified portions of the bilateral collecting systems or ureters. Stomach/Bowel: Stomach is within normal limits. No evidence of bowel wall thickening or dilatation. Appendix appears normal. Vascular/Lymphatic: No abdominal aorta or iliac aneurysm. Severe atherosclerotic plaque of the aorta and its branches. No abdominal, pelvic, or inguinal lymphadenopathy. Reproductive: Lobulated uterus with a right intramural 4.9 cm hyperdense lesion likely representing a fibroid. Other coarsely calcified lesions within the uterus likely represent degenerative uterine fibroids. Endometrium not visualized. Bilateral adnexa are unremarkable. Pessary device noted. Other: No intraperitoneal free fluid. No intraperitoneal free gas. No organized fluid  collection. Musculoskeletal: Large supraumbilical fat containing ventral wall hernia with an abdominal defect of 1.8 cm. Small fat containing umbilical hernia with an abdominal defect of 1 cm. No suspicious lytic or blastic osseous lesions. No acute displaced fracture. Severe degenerative changes and deformity of the left hip. Review of the MIP images confirms the above findings. IMPRESSION: 1. No pulmonary embolus. 2. Left lower lobe base peribronchovascular consolidation with air bronchograms suggestive of infection/inflammation. Recommend follow-up CT in 3 months to evaluate for resolution and exclude underlying mass lesion. 3. A 7 mm left upper lobe subpleural nodule. Non-contrast chest CT at 6-12 months is recommended. If the nodule is stable at time of repeat CT, then future CT at 18-24 months (from today's scan) is considered optional for low-risk patients, but is recommended for high-risk patients. This recommendation follows the consensus statement: Guidelines for Management of Incidental Pulmonary Nodules Detected on CT Images: From the Fleischner Society 2017; Radiology 2017; 284:228-243. 4. Indeterminate borderline enlarged right hilar lymph node. Recommend attention on follow-up. 5. Tiny hiatal hernia. 6. Uterine fibroids. 7. Colonic diverticulosis with no acute diverticulitis. 8. Severe degenerative changes and deformity of the left hip. 9. Aortic Atherosclerosis (ICD10-I70.0) including at least 3 vessel coronary calcification. 10. Supraumbilical and umbilical fat contained ventral wall hernias with no findings to suggest incarceration. Electronically Signed   By: Iven Finn M.D.   On: 01/18/2022 19:53   CT ABDOMEN PELVIS W CONTRAST  Result Date: 01/18/2022 CLINICAL DATA:  Increasing fatigue/weakness, cough since Christmas Eve. Pulmonary embolism (PE) suspected, high prob; Abdominal pain, acute, nonlocalized RUQ pain EXAM: CT ANGIOGRAPHY CHEST CT ABDOMEN AND PELVIS WITH CONTRAST TECHNIQUE:  Multidetector CT imaging of the chest was performed using the standard protocol during bolus administration of intravenous contrast. Multiplanar CT image reconstructions and MIPs were obtained to evaluate the vascular anatomy. Multidetector CT imaging of the abdomen and pelvis was performed using the standard protocol during bolus administration of intravenous contrast. RADIATION DOSE REDUCTION: This exam was performed according to the departmental dose-optimization program which includes automated exposure control, adjustment of the mA and/or kV according to patient size and/or use of iterative reconstruction technique. CONTRAST:  36m OMNIPAQUE IOHEXOL 350 MG/ML SOLN COMPARISON:  None Available. FINDINGS: CTA CHEST FINDINGS Cardiovascular: No pulmonary embolus. Main pulmonary artery is normal in caliber. No evidence of thoracic aortic aneurysm or dissection. Normal heart size. Trace pericardial effusion. Moderate atherosclerotic plaque of the thoracic aorta. At least 3 vessel coronary artery calcifications. Mediastinum/Nodes: Borderline enlarged right hilar lymph node measuring up to 1 cm (4:21). No enlarged mediastinal, left hilar, or axillary lymph nodes. Right thyroid gland subcentimeter hypodensity. Not clinically significant; no follow-up imaging recommended (ref: J Am Coll Radiol. 2015 Feb;12(2):  143-50). Trachea and esophagus demonstrate no significant findings. Tiny hiatal hernia. Lungs/Pleura: Left lower lobe base peribronchovascular consolidation with air bronchograms. 6 mm left upper lobe subpleural nodule (4:24). No pulmonary mass. No pleural effusion. No pneumothorax. Musculoskeletal: No chest wall abnormality. No suspicious lytic or blastic osseous lesions. No acute displaced fracture. Degenerative changes of the left shoulder. Review of the MIP images confirms the above findings. CT ABDOMEN and PELVIS FINDINGS Hepatobiliary: Couple pericentimeter hypodense lesions within the right hepatic lobe  likely represent simple hepatic cysts. No gallstones, gallbladder wall thickening, or pericholecystic fluid. No biliary dilatation. Pancreas: No focal lesion. Normal pancreatic contour. No surrounding inflammatory changes. No main pancreatic ductal dilatation. Spleen: Normal in size without focal abnormality.  Exophytic right Adrenals/Urinary Tract: No adrenal nodule bilaterally. Bilateral kidneys enhance symmetrically. Exophytic superior as well as inferior right renal pole fluid dense lesions likely represent simple renal cysts. Similar finding on the left (4:70). Simple renal cysts, in the absence of clinically indicated signs/symptoms, require no independent follow-up. No hydronephrosis. No hydroureter. The urinary bladder is unremarkable. On delayed imaging, there is no urothelial wall thickening and there are no filling defects in the opacified portions of the bilateral collecting systems or ureters. Stomach/Bowel: Stomach is within normal limits. No evidence of bowel wall thickening or dilatation. Appendix appears normal. Vascular/Lymphatic: No abdominal aorta or iliac aneurysm. Severe atherosclerotic plaque of the aorta and its branches. No abdominal, pelvic, or inguinal lymphadenopathy. Reproductive: Lobulated uterus with a right intramural 4.9 cm hyperdense lesion likely representing a fibroid. Other coarsely calcified lesions within the uterus likely represent degenerative uterine fibroids. Endometrium not visualized. Bilateral adnexa are unremarkable. Pessary device noted. Other: No intraperitoneal free fluid. No intraperitoneal free gas. No organized fluid collection. Musculoskeletal: Large supraumbilical fat containing ventral wall hernia with an abdominal defect of 1.8 cm. Small fat containing umbilical hernia with an abdominal defect of 1 cm. No suspicious lytic or blastic osseous lesions. No acute displaced fracture. Severe degenerative changes and deformity of the left hip. Review of the MIP images  confirms the above findings. IMPRESSION: 1. No pulmonary embolus. 2. Left lower lobe base peribronchovascular consolidation with air bronchograms suggestive of infection/inflammation. Recommend follow-up CT in 3 months to evaluate for resolution and exclude underlying mass lesion. 3. A 7 mm left upper lobe subpleural nodule. Non-contrast chest CT at 6-12 months is recommended. If the nodule is stable at time of repeat CT, then future CT at 18-24 months (from today's scan) is considered optional for low-risk patients, but is recommended for high-risk patients. This recommendation follows the consensus statement: Guidelines for Management of Incidental Pulmonary Nodules Detected on CT Images: From the Fleischner Society 2017; Radiology 2017; 284:228-243. 4. Indeterminate borderline enlarged right hilar lymph node. Recommend attention on follow-up. 5. Tiny hiatal hernia. 6. Uterine fibroids. 7. Colonic diverticulosis with no acute diverticulitis. 8. Severe degenerative changes and deformity of the left hip. 9. Aortic Atherosclerosis (ICD10-I70.0) including at least 3 vessel coronary calcification. 10. Supraumbilical and umbilical fat contained ventral wall hernias with no findings to suggest incarceration. Electronically Signed   By: Iven Finn M.D.   On: 01/18/2022 19:53   DG Chest Port 1 View  Result Date: 01/18/2022 CLINICAL DATA:  Weakness, cough EXAM: PORTABLE CHEST 1 VIEW COMPARISON:  02/16/2009 FINDINGS: Heart size within normal limits. Aortic atherosclerosis. Low lung volumes with streaky bibasilar opacities. No pleural effusion or pneumothorax. Advanced degenerative changes of the left glenohumeral joint. IMPRESSION: Low lung volumes with streaky bibasilar opacities, likely atelectasis. Electronically Signed  By: Davina Poke D.O.   On: 01/18/2022 15:55    Microbiology: Results for orders placed or performed during the hospital encounter of 01/18/22  Urine Culture     Status: Abnormal    Collection Time: 01/18/22  3:11 PM   Specimen: In/Out Cath Urine  Result Value Ref Range Status   Specimen Description   Final    IN/OUT CATH URINE Performed at [facility], [place] [address], [place], [place] [address]    Special Requests   Final    NONE Performed at [facility], [place] [address], [place], [place] [address]    Culture MULTIPLE SPECIES PRESENT, SUGGEST RECOLLECTION (A)  Final   Report Status 01/19/2022 FINAL  Final  Blood Culture (routine x 2)     Status: None   Collection Time: 01/18/22  3:30 PM   Specimen: BLOOD LEFT WRIST  Result Value Ref Range Status   Specimen Description   Final    BLOOD LEFT WRIST Performed at [facility], [place] [address] [place], [place] [address]    Special Requests   Final    BOTTLES DRAWN AEROBIC AND ANAEROBIC Blood Culture results may not be optimal due to an excessive volume of blood received in culture bottles Performed at [place] [address] [place], [place] [address]    Culture   Final    NO GROWTH 5 DAYS Performed at [facility], [place] [address], [place], [place] [address]    Report Status 01/23/2022 FINAL  Final  Blood Culture (routine x 2)     Status: None   Collection Time: 01/18/22  3:47 PM   Specimen: BLOOD  Result Value Ref Range Status   Specimen Description   Final    BLOOD RIGHT ANTECUBITAL Performed at [place] [address], [place], [place] [address]    Special Requests   Final    BOTTLES DRAWN AEROBIC AND ANAEROBIC Blood Culture adequate volume Performed at [place] [address] [place], [place] [address]    Culture   Final    NO GROWTH 5 DAYS Performed at [facility], [place] [address] [place],  [address]    Report Status 01/23/2022 FINAL  Final  Resp panel by RT-PCR (RSV, Flu A&B, Covid) Urine, Clean Catch     Status: None   Collection Time: 01/18/22  5:35 PM    Specimen: Urine, Clean Catch; Nasal Swab  Result Value Ref Range Status   SARS Coronavirus 2 by RT PCR NEGATIVE NEGATIVE Final    Comment: (NOTE) SARS-CoV-2 target nucleic acids are NOT DETECTED.  The SARS-CoV-2 RNA is generally detectable in upper respiratory specimens during the acute phase of infection. The lowest concentration of SARS-CoV-2 viral copies this assay can detect is 138 copies/mL. A negative result does not preclude SARS-Cov-2 infection and should not be used as the sole basis for treatment or other patient management decisions. A negative result may occur with  improper specimen collection/handling, submission of specimen other than nasopharyngeal swab, presence of viral mutation(s) within the areas targeted by this assay, and inadequate number of viral copies(<138 copies/mL). A negative result must be combined with clinical observations, patient history, and epidemiological information. The expected result is Negative.  Fact Sheet for Patients:  EntrepreneurPulse.com.au  Fact Sheet for Healthcare Providers:  IncredibleEmployment.be  This test is no t yet approved or cleared by the [place] FDA and  has been  authorized for detection and/or diagnosis of SARS-CoV-2 by FDA under an Emergency Use Authorization (EUA). This EUA will remain  in effect (meaning this test can be used) for the duration of the COVID-19 declaration under Section 564(b)(1) of the Act, 21 U.S.C.section 360bbb-3(b)(1), unless the authorization is terminated  or revoked sooner.       Influenza A by PCR NEGATIVE NEGATIVE Final   Influenza B by PCR NEGATIVE NEGATIVE Final    Comment: (NOTE) The Xpert Xpress SARS-CoV-2/FLU/RSV plus assay is intended as an aid in the diagnosis of influenza from Nasopharyngeal swab specimens and should not be used as a sole basis for treatment. Nasal washings and aspirates are unacceptable for Xpert Xpress  SARS-CoV-2/FLU/RSV testing.  Fact Sheet for Patients: EntrepreneurPulse.com.au  Fact Sheet for Healthcare Providers: IncredibleEmployment.be  This test is not yet approved or cleared by the [place] FDA and has been authorized for detection and/or diagnosis of SARS-CoV-2 by FDA under an Emergency Use Authorization (EUA). This EUA will remain in effect (meaning this test can be used) for the duration of the COVID-19 declaration under Section 564(b)(1) of the Act, 21 U.S.C. section 360bbb-3(b)(1), unless the authorization is terminated or revoked.     Resp Syncytial Virus by PCR NEGATIVE NEGATIVE Final    Comment: (NOTE) Fact Sheet for Patients: EntrepreneurPulse.com.au  Fact Sheet for Healthcare Providers: IncredibleEmployment.be  This test is not yet approved or cleared by the [place] FDA and has been authorized for detection and/or diagnosis of SARS-CoV-2 by FDA under an Emergency Use Authorization (EUA). This EUA will remain in effect (meaning this test can be used) for the duration of the COVID-19 declaration under Section 564(b)(1) of the Act, 21 U.S.C. section 360bbb-3(b)(1), unless the authorization is terminated or revoked.  Performed at [facility], [place] [address], [place], [place] [address]   Expectorated Sputum Assessment w Gram Stain, Rflx to Resp Cult     Status: None   Collection Time: 01/19/22 11:37 AM   Specimen: Expectorated Sputum  Result Value Ref Range Status   Specimen Description EXPECTORATED SPUTUM  Final   Special Requests NONE  Final   Sputum evaluation   Final    THIS SPECIMEN IS ACCEPTABLE FOR SPUTUM CULTURE Performed at [facility], [place] [address], [place], [place] [address]    Report Status 01/19/2022 FINAL  Final  Culture, Respiratory w Gram Stain     Status: None   Collection Time: 01/19/22 11:37 AM  Result Value Ref  Range Status   Specimen Description   Final    EXPECTORATED SPUTUM Performed at [facility], [place] [address] [place], [place] [address]    Special Requests   Final    NONE Reflexed from [id number] Performed at [facility], [place] [address] [place], [place] [address]    Gram Stain   Final    FEW GRAM NEGATIVE RODS RARE GRAM POSITIVE COCCI IN PAIRS RARE WBC PRESENT, PREDOMINANTLY PMN RARE SQUAMOUS EPITHELIAL CELLS PRESENT    Culture   Final    Normal respiratory flora-no Staph aureus or Pseudomonas seen Performed at [facility], [address], [place],  [address]    Report Status 01/22/2022 FINAL  Final    Labs: CBC: Recent Labs  Lab 01/21/22 0356  WBC 8.4  NEUTROABS 4.2  HGB 10.5*  HCT 34.2*  MCV 84.9  PLT 024   Basic Metabolic Panel: Recent Labs  Lab 01/21/22 0356  NA 138  K  3.9  CL 108  CO2 22  GLUCOSE 129*  BUN 12  CREATININE 0.98  CALCIUM 8.5*   Liver Function Tests: Recent Labs  Lab 01/21/22 0356  AST 48*  ALT 103*  ALKPHOS 57  BILITOT 0.4  PROT 6.0*  ALBUMIN 2.6*   CBG: Recent Labs  Lab 01/20/22 1117 01/20/22 1703 01/20/22 2124 01/21/22 0738 01/21/22 1100  GLUCAP 306* 323* 301* 144* 322*    Discharge time spent: 38 minutes.   Signed: Hosie Poisson, MD [facility]

## 2022-02-04 ENCOUNTER — Ambulatory Visit: Payer: Self-pay

## 2022-02-04 NOTE — Patient Outreach (Signed)
  Care Coordination   Follow Up Visit Note   02/04/2022 Name: Holly Acosta MRN: [id number] DOB: [birth year]  Holly Acosta is a 67 y.o. year old female who sees [place], Anupa, DO for primary care. I spoke with  Holly Acosta by phone today.  What matters to the patients health and wellness today?  Staying healthy    Goals Addressed             This Visit's Progress    Diabetes Management       Care Coordination Interventions: Provided education to patient about basic DM disease process Reviewed medications with patient and discussed importance of medication adherence Discussed plans with patient for ongoing care management follow up and provided patient with direct contact information for care management team Patient with recent hospital discharge for sepsis/pneumonia.  Discussed signs of respiratory infection and when to contact physician.  She verbalized understanding and is feeling better.   Blood sugars are much better with highest sugar being 163.   Encouraged patient to continue to adhere to diabetes diet and medications.  She verbalized understanding.           SDOH assessments and interventions completed:  Yes  SDOH Interventions Today    Flowsheet Row Most Recent Value  SDOH Interventions   Utilities Interventions Intervention Not Indicated        Care Coordination Interventions:  Yes, provided   Follow up plan: Follow up call scheduled for February    Encounter Outcome:  Pt. Visit Completed   Jone Baseman, RN, MSN [facility] Care Management Coordinator Direct Line [phone]

## 2022-02-04 NOTE — Patient Instructions (Signed)
Visit Information  Thank you for taking time to visit with me today. Please don't hesitate to contact me if I can be of assistance to you.   Following are the goals we discussed today:   Goals Addressed             This Visit's Progress    Diabetes Management       Care Coordination Interventions: Provided education to patient about basic DM disease process Reviewed medications with patient and discussed importance of medication adherence Discussed plans with patient for ongoing care management follow up and provided patient with direct contact information for care management team Patient with recent hospital discharge for sepsis/pneumonia.  Discussed signs of respiratory infection and when to contact physician.  She verbalized understanding and is feeling better.   Blood sugars are much better with highest sugar being 163.   Encouraged patient to continue to adhere to diabetes diet and medications.  She verbalized understanding.           Our next appointment is by telephone on 02/25/22 at 915 am  Please call the care guide team at [phone] if you need to cancel or reschedule your appointment.   If you are experiencing a Mental Health or [place] or need someone to talk to, please call the Suicide and Crisis Lifeline: 988   The patient verbalized understanding of instructions, educational materials, and care plan provided today and agreed to receive a mailed copy of patient instructions, educational materials, and care plan.   Telephone follow up appointment with care management team member scheduled for: No further follow up required: February  Andreana Klingerman J Pablo Stauffer, RN, MSN [facility] Care Management Coordinator Direct Line [phone]

## 2022-02-06 ENCOUNTER — Ambulatory Visit: Payer: Medicare Other | Admitting: Family Medicine

## 2022-02-06 DIAGNOSIS — E113393 Type 2 diabetes mellitus with moderate nonproliferative diabetic retinopathy without macular edema, bilateral: Secondary | ICD-10-CM | POA: Diagnosis not present

## 2022-02-13 ENCOUNTER — Encounter: Payer: Self-pay | Admitting: Family Medicine

## 2022-02-13 ENCOUNTER — Ambulatory Visit (INDEPENDENT_AMBULATORY_CARE_PROVIDER_SITE_OTHER): Payer: 59 | Admitting: Family Medicine

## 2022-02-13 VITALS — BP 133/83 | HR 74 | Ht 65.0 in | Wt 165.4 lb

## 2022-02-13 DIAGNOSIS — R7989 Other specified abnormal findings of blood chemistry: Secondary | ICD-10-CM | POA: Diagnosis not present

## 2022-02-13 DIAGNOSIS — E1169 Type 2 diabetes mellitus with other specified complication: Secondary | ICD-10-CM | POA: Diagnosis not present

## 2022-02-13 DIAGNOSIS — E785 Hyperlipidemia, unspecified: Secondary | ICD-10-CM | POA: Diagnosis not present

## 2022-02-13 DIAGNOSIS — E1165 Type 2 diabetes mellitus with hyperglycemia: Secondary | ICD-10-CM

## 2022-02-13 NOTE — Patient Instructions (Signed)
We get a check on your liver function today.  Please make sure to call your cardiologist office regarding your recent hospitalization and follow-up for your cholesterol.  When you go to the front, please make sure to schedule an appointment with Dr. Valentina Lucks for your sugars, hopefully we can get you set up with something like a continuous glucose monitor so that you want to keep sticking her finger and we can keep a close eye on your sugars and titrate your insulin as we need it.

## 2022-02-13 NOTE — Progress Notes (Signed)
    SUBJECTIVE:   CHIEF COMPLAINT / HPI:   Hospital follow-up - admitted 12/30-1/2 for sepsis d/t PNA with respiratory failure requiring oxygen (weaned prior to discharge) - Noted to have elevated LFTs (likely shock liver) and statin was held upon discharge  HLD  CAD  - s/pp DES Lcx 2019 - Off Rosuvastatin due to elevated LFTs in hospitalization, patient does not want to resume - Patient has not yet followed-up with cardiology   T2DM - A1c 14.1 during hospitalization  - 14 Units with meals, Lantus 32 Units - CBG this morning was 133 (has had fasting sugars <100 but asymptomatic)  PERTINENT  PMH / PSH: Reviewed  OBJECTIVE:   BP 133/83   Pulse 74   Ht '5\' 5"'$  (1.651 m)   Wt 165 lb 6.4 oz (75 kg)   SpO2 99%   BMI 27.52 kg/m   Gen: well-appearing, NAD CV: RRR, no m/r/g appreciated, no peripheral edema Pulm: CTAB, no wheezes/crackles GI: soft, non-tender, non-distended  ASSESSMENT/PLAN:   Elevated LFTs Noted during hospitalization, likely from shock liver. Statin held upon discharge. - repeat CMP today  Hyperlipidemia associated with type 2 diabetes mellitus ([place]) Not taking statin due to elevated LFTs during hospitalization. Patient not wanting to resume at this time, per the last cardiology note there was consideration to have patient follow-up with cardiology pharmacist.  - Encouraged patient to call and make appointment with cardiology   Uncontrolled type 2 diabetes mellitus with hyperglycemia ([place]) A1c in hospital 14.1. Fasting sugars typically in the 130s recently.  - continue Lantus 32 units daily - Continue Humalog 14 Units with meals  - Recommend follow-up with Dr. Valentina Lucks in clinic for closer monitoring - Consider CGM for close monitoring given the multiple injections per day     Rise Patience, [place]

## 2022-02-13 NOTE — Assessment & Plan Note (Signed)
>>  ASSESSMENT AND PLAN FOR UNCONTROLLED TYPE 2 DIABETES MELLITUS WITH HYPERGLYCEMIA (HCC) WRITTEN ON 02/13/2022  2:54 PM BY LILLAND, ALANA, DO  A1c in hospital 14.1. Fasting sugars typically in the 130s recently.  - continue Lantus 32 units daily - Continue Humalog 14 Units with meals  - Recommend follow-up with Dr. Raymondo Band in clinic for closer monitoring - Consider CGM for close monitoring given the multiple injections per day

## 2022-02-13 NOTE — Assessment & Plan Note (Signed)
A1c in hospital 14.1. Fasting sugars typically in the 130s recently.  - continue Lantus 32 units daily - Continue Humalog 14 Units with meals  - Recommend follow-up with Dr. Valentina Lucks in clinic for closer monitoring - Consider CGM for close monitoring given the multiple injections per day

## 2022-02-13 NOTE — Assessment & Plan Note (Signed)
Noted during hospitalization, likely from shock liver. Statin held upon discharge. - repeat CMP today

## 2022-02-13 NOTE — Assessment & Plan Note (Signed)
Not taking statin due to elevated LFTs during hospitalization. Patient not wanting to resume at this time, per the last cardiology note there was consideration to have patient follow-up with cardiology pharmacist.  - Encouraged patient to call and make appointment with cardiology

## 2022-02-14 LAB — COMPREHENSIVE METABOLIC PANEL
ALT: 11 IU/L (ref 0–32)
AST: 16 IU/L (ref 0–40)
Albumin/Globulin Ratio: 1.4 (ref 1.2–2.2)
Albumin: 4.2 g/dL (ref 3.9–4.9)
Alkaline Phosphatase: 79 IU/L (ref 44–121)
BUN/Creatinine Ratio: 15 (ref 12–28)
BUN: 16 mg/dL (ref 8–27)
Bilirubin Total: <0.2 mg/dL (ref 0.0–1.2)
CO2: 19 mmol/L — ABNORMAL LOW (ref 20–29)
Calcium: 10.1 mg/dL (ref 8.7–10.3)
Chloride: 102 mmol/L (ref 96–106)
Creatinine, Ser: 1.09 mg/dL — ABNORMAL HIGH (ref 0.57–1.00)
Globulin, Total: 2.9 g/dL (ref 1.5–4.5)
Glucose: 97 mg/dL (ref 70–99)
Potassium: 4.7 mmol/L (ref 3.5–5.2)
Sodium: 142 mmol/L (ref 134–144)
Total Protein: 7.1 g/dL (ref 6.0–8.5)
eGFR: 56 mL/min/{1.73_m2} — ABNORMAL LOW (ref >59)

## 2022-02-17 ENCOUNTER — Telehealth: Payer: Self-pay | Admitting: Family Medicine

## 2022-02-17 NOTE — Telephone Encounter (Signed)
Called patient regarding results, which show improved/normal liver function. Patient has cardiology appointment coming up on 1/31 and reminded patient to discuss the cholesterol medication (statin) as she was not wanting to restart it at this time.    Holly Gaughran, DO

## 2022-02-17 NOTE — Progress Notes (Signed)
Cardiology Clinic Note   Patient Name: Holly Acosta Date of Encounter: 02/19/2022  Primary Care Provider:  Reece Leader, DO Primary Cardiologist:  Peter Swaziland, MD  Patient Profile    Holly Acosta 67 year old female presents the clinic today for follow-up evaluation of her coronary artery disease and hypertension.  Past Medical History    Past Medical History:  Diagnosis Date   Acute cerebrovascular accident (CVA) of cerebellum (HCC) 04/29/2017   Acute cerebrovascular accident (CVA) of cerebellum (HCC) 04/29/2017   Acute respiratory failure with hypoxia (HCC)    AKI (acute kidney injury) (HCC)    Anemia    Arthritis of knee    "both knees" (04/24/2017)   Cerebrovascular accident (CVA) due to occlusion of left posterior cerebral artery (HCC) 12/03/2017   Chest pain 04/24/2017   COLONIC POLYPS, HYPERPLASTIC 09/13/2009   Qualifier: Diagnosis of  By: Delrae Alfred MD, Elizabeth     Constipation 06/02/2017   Coronary artery disease involving native coronary artery of native heart with unstable angina pectoris (HCC) 04/30/2017   Dehydration    Diabetes mellitus, type 2 (HCC)    History of blood transfusion    "related to prolapsed uterus"   HLD (hyperlipidemia)    HTN (hypertension)    Non-ST elevation (NSTEMI) myocardial infarction (HCC)    Non-ST elevation (NSTEMI) myocardial infarction (HCC)    Pneumonia due to COVID-19 virus 02/18/2019   Prolapsed uterus    Severe obstructive sleep apnea-hypopnea syndrome 01/01/2018   Sleep apnea    "have mask; haven't worn it for a long time" (04/24/2017)   Status post coronary artery stent placement    Past Surgical History:  Procedure Laterality Date   CARDIAC CATHETERIZATION  2011   CORONARY STENT INTERVENTION N/A 04/27/2017   Procedure: CORONARY STENT INTERVENTION;  Surgeon: Corky Crafts, MD;  Location: [facility] INVASIVE CV LAB;  Service: Cardiovascular;  Laterality: N/A;   LEFT HEART CATH AND CORONARY ANGIOGRAPHY N/A 04/27/2017   Procedure:  LEFT HEART CATH AND CORONARY ANGIOGRAPHY;  Surgeon: Corky Crafts, MD;  Location: [facility] INVASIVE CV LAB;  Service: Cardiovascular;  Laterality: N/A;   TUBAL LIGATION      Allergies  Allergies  Allergen Reactions   Lipitor [Atorvastatin] Other (See Comments)    Cramping in feet that resolved after stopping med   Other Nausea And Vomiting and Other (See Comments)    GLP-1: Trulicity, Ozempic, Victoza - vomiting and cramping    Diclofenac Nausea Only and Other (See Comments)    Caused a lot of stomach pain    Trulicity [Dulaglutide] Nausea And Vomiting and Other (See Comments)    GLP-1: Trulicity, Ozempic, Victoza - vomiting and cramping     History of Present Illness    Holly Acosta has a PMH of hypertension, coronary artery disease, OSA, HLD, type 2 diabetes, CKD stage IIIa, depression, obesity, and elevated LFTs.  Cardiac catheter cessation in 2019 with PCI and DES to her circumflex.  She was admitted to the hospital on 01/18/2022 and discharged on 01/21/22.  She was admitted with sepsis due to left lower lobe pneumonia.  She received IV antibiotics and was transitioned to oral antibiotics at discharge.  Her respiratory virus panel was negative.  She was noted to have elevated LFTs and her statin therapy was placed on hold.  It was recommended that her statin therapy and ezetimibe be restarted in 1 month after repeat LFTs.  She initially required supplemental oxygen up to 4 L which was weaned  as tolerated.  A noncontrast chest CT in 6-12 months was recommended for a lung nodule that was noted on imaging.  She presents to the clinic today for follow-up evaluation and states she feels fairly well today.  She reports that she does not want to resume statin therapy.  We reviewed her previous cardiac catheterization and need for lipid management.  She and her daughter expressed understanding.  We shared decision making to discuss strategies for high-fiber diet and lowering cholesterol.  She  will resume ezetimibe.  We will repeat her fasting lipids and LFTs in 8 weeks and at that time decide if she we will need to go to pharmacy lipid clinic for consideration of PCSK9 inhibitor.  Will plan follow-up in 4 months.  Today she denies chest pain, shortness of breath, lower extremity edema, fatigue, palpitations, melena, hematuria, hemoptysis, diaphoresis, weakness, presyncope, syncope, orthopnea, and PND.   Home Medications    Prior to Admission medications   Medication Sig Start Date End Date Taking? Authorizing Provider  Accu-Chek FastClix Lancets MISC 1 each by Does not apply route daily. Check sugar daily as needed 09/09/21   Ganta, Anupa, DO  acetaminophen (TYLENOL) 500 MG tablet Take 1,000 mg by mouth daily as needed for mild pain.    [provider]  amLODipine (NORVASC) 5 MG tablet TAKE 1 TABLET BY MOUTH EVERY DAY Patient taking differently: Take 5 mg by mouth daily. 07/17/21   Swaziland, Peter M, MD  aspirin EC 81 MG tablet Take 1 tablet (81 mg total) by mouth daily. 03/30/14   Jamal Collin, MD  B-D UF III MINI PEN NEEDLES 31G X 5 MM MISC USE AS DIRECTED 3 TIMES A DAY 12/23/21   Ganta, Anupa, DO  Blood Glucose Monitoring Suppl (ACCU-CHEK GUIDE ME) w/Device KIT PLEASE USE TO CHECK BLOOD SUGAR UP TO THREE TIMES DAILY. 11/14/21   Ganta, Anupa, DO  carvedilol (COREG) 12.5 MG tablet TAKE 1 TABLET BY MOUTH 2 TIMES DAILY WITH A MEAL. Patient taking differently: Take 12.5 mg by mouth 2 (two) times daily with a meal. 09/13/21   Ganta, Anupa, DO  cyanocobalamin (VITAMIN B12) 1000 MCG tablet Take 1 tablet (1,000 mcg total) by mouth daily. 01/21/22 05/21/22  Kathlen Mody, MD  dapagliflozin propanediol (FARXIGA) 10 MG TABS tablet Take 1 tablet (10 mg total) by mouth daily. 03/25/21   Ganta, Anupa, DO  ezetimibe (ZETIA) 10 MG tablet TAKE 1 TABLET BY MOUTH EVERY DAY Patient taking differently: Take 10 mg by mouth daily. 05/06/21   Ganta, Anupa, DO  ferrous sulfate 325 (65 FE) MG tablet Take 1  tablet (325 mg total) by mouth 2 (two) times daily with a meal. 01/21/22   Kathlen Mody, MD  glucose blood (ACCU-CHEK GUIDE) test strip Please use to check blood sugar up to three times daily. 09/09/21   Ganta, Anupa, DO  guaiFENesin-dextromethorphan (ROBITUSSIN DM) 100-10 MG/5ML syrup Take 5 mLs by mouth every 4 (four) hours as needed for cough. Patient not taking: Reported on 02/13/2022 01/21/22   Kathlen Mody, MD  HUMALOG KWIKPEN 100 UNIT/ML KwikPen INJECT 14 UNITS INTO THE SKIN 3 (THREE) TIMES DAILY. Patient taking differently: Inject 14-15 Units into the skin See admin instructions. Inject 14-15 units 2-3 times a day 07/05/21   Ganta, Anupa, DO  insulin glargine (LANTUS SOLOSTAR) 100 UNIT/ML Solostar Pen INJECT 32 UNITS INTO SKIN DAILY Patient taking differently: Inject 32 Units into the skin daily. 05/22/21   Ganta, Anupa, DO  lisinopril (ZESTRIL) 10 MG tablet  Take 1 tablet (10 mg total) by mouth daily. 05/06/21   Reece Leader, DO  metFORMIN (GLUCOPHAGE) 1000 MG tablet Take 1 tablet (1,000 mg total) by mouth 2 (two) times daily with a meal. 03/19/21   Ganta, Anupa, DO  Omega-3 Fatty Acids (FISH OIL) 1000 MG CAPS Take 1,000 mg by mouth daily.    [provider]  rosuvastatin (CRESTOR) 40 MG tablet Take 1 tablet (40 mg total) by mouth daily. Patient not taking: Reported on 02/13/2022 02/20/22   Kathlen Mody, MD  senna-docusate (SENOKOT-S) 8.6-50 MG tablet Take 1 tablet by mouth at bedtime as needed for mild constipation. 01/21/22   Kathlen Mody, MD    Family History    Family History  Problem Relation Age of Onset   Diabetes Mother    Hypertension Mother    Hypertension Father    She indicated that the status of her mother is unknown. She indicated that the status of her father is unknown.  Social History    Social History   Socioeconomic History   Marital status: Divorced    Spouse name: Not on file   Number of children: Not on file   Years of education: Not on file   Highest  education level: Not on file  Occupational History   Not on file  Tobacco Use   Smoking status: Former    Packs/day: 1.00    Years: 33.00    Total pack years: 33.00    Types: Cigarettes    Start date: 01/20/1973    Quit date: 11/04/2006    Years since quitting: 15.3   Smokeless tobacco: Never  Vaping Use   Vaping Use: Never used  Substance and Sexual Activity   Alcohol use: Never    Alcohol/week: 0.0 standard drinks of alcohol   Drug use: Never   Sexual activity: Not Currently  Other Topics Concern   Not on file  Social History Narrative   Lives with daughter Toniann Fail age 57) and 4 grandchildren.   Disability - Arthritis. Does get disability check.   Does not have a living will yet   Social Determinants of Health   Financial Resource Strain: Not on file  Food Insecurity: No Food Insecurity (01/19/2022)   Hunger Vital Sign    Worried About Running Out of Food in the Last Year: Never true    Ran Out of Food in the Last Year: Never true  Transportation Needs: No Transportation Needs (01/22/2022)   PRAPARE - Administrator, Civil Service (Medical): No    Lack of Transportation (Non-Medical): No  Physical Activity: Not on file  Stress: Not on file  Social Connections: Not on file  Intimate Partner Violence: Not At Risk (01/19/2022)   Humiliation, Afraid, Rape, and Kick questionnaire    Fear of Current or Ex-Partner: No    Emotionally Abused: No    Physically Abused: No    Sexually Abused: No     Review of Systems    General:  No chills, fever, night sweats or weight changes.  Cardiovascular:  No chest pain, dyspnea on exertion, edema, orthopnea, palpitations, paroxysmal nocturnal dyspnea. Dermatological: No rash, lesions/masses Respiratory: No cough, dyspnea Urologic: No hematuria, dysuria Abdominal:   No nausea, vomiting, diarrhea, bright red blood per rectum, melena, or hematemesis Neurologic:  No visual changes, wkns, changes in mental status. All other  systems reviewed and are otherwise negative except as noted above.  Physical Exam    VS:  BP 128/64   Pulse  75   Ht 5' 5.5" (1.664 m)   Wt 164 lb 12.8 oz (74.8 kg)   SpO2 99%   BMI 27.01 kg/m  , BMI Body mass index is 27.01 kg/m. GEN: Well nourished, well developed, in no acute distress. HEENT: normal. Neck: Supple, no JVD, carotid bruits, or masses. Cardiac: RRR, no murmurs, rubs, or gallops. No clubbing, cyanosis, edema.  Radials/DP/PT 2+ and equal bilaterally.  Respiratory:  Respirations regular and unlabored, clear to auscultation bilaterally. GI: Soft, nontender, nondistended, BS + x 4. MS: no deformity or atrophy. Skin: warm and dry, no rash. Neuro:  Strength and sensation are intact. Psych: Normal affect.  Accessory Clinical Findings    Recent Labs: 01/18/2022: B Natriuretic Peptide 69.0 01/20/2022: Magnesium 2.1 01/21/2022: Hemoglobin 10.5; Platelets 335 02/13/2022: ALT 11; BUN 16; Creatinine, Ser 1.09; Potassium 4.7; Sodium 142   Recent Lipid Panel    Component Value Date/Time   CHOL 266 (H) 03/06/2020 0915   TRIG 403 (H) 03/06/2020 0915   HDL 35 (L) 03/06/2020 0915   CHOLHDL 7.6 (H) 03/06/2020 0915   CHOLHDL 5.1 04/29/2017 0557   VLDL 32 04/29/2017 0557   LDLCALC 155 (H) 03/06/2020 0915   LDLDIRECT 135 (H) 12/31/2012 0949         ECG personally reviewed by me today-none today.  Echocardiogram 04/29/2017  Study Conclusions   - Left ventricle: The cavity size was normal. There was moderate    concentric hypertrophy. Systolic function was normal. The    estimated ejection fraction was in the range of 55% to 60%.    Doppler parameters are consistent with abnormal left ventricular    relaxation (grade 1 diastolic dysfunction). There was no evidence    of elevated ventricular filling pressure by Doppler parameters.  - Mitral valve: There was no regurgitation. Valve area by pressure    half-time: 2.08 cm^2.  - Left atrium: The atrium was normal in size.  -  Right ventricle: Systolic function was normal.  - Right atrium: The atrium was normal in size.  - Tricuspid valve: There was no regurgitation.  - Pericardium, extracardiac: The pericardium was normal in    appearance.   Impressions:   - There is hypokinesis of the basal inferior, inferolateral and mid    inferolateral walls, overall LVEF is preserved at 55-60%.   -------------------------------------------------------------------  Study data:  Comparison was made to the study of 04/25/2017.  Study  status:  Routine.  Procedure:  The patient reported no pain pre or  post test. Transthoracic echocardiography. Image quality was  adequate.  Study completion:  There were no complications.  Transthoracic echocardiography.  M-mode, limited 2D, limited  spectral Doppler, and color Doppler.  Birthdate:  Patient  birthdate: 1956-01-20.  Age:  Patient is 67 yr old.  Sex:  Gender:  female.    BMI: 31.2 kg/m^2.  Blood pressure:     150/71  Patient  status:  Inpatient.  Study date:  Study date: 04/29/2017. Study  time: 11:59 AM.   -------------------------------------------------------------------   -------------------------------------------------------------------  Left ventricle:  The cavity size was normal. There was moderate  concentric hypertrophy. Systolic function was normal. The estimated  ejection fraction was in the range of 55% to 60%. Doppler  parameters are consistent with abnormal left ventricular relaxation  (grade 1 diastolic dysfunction). There was no evidence of elevated  ventricular filling pressure by Doppler parameters.   -------------------------------------------------------------------  Aortic valve:   Structurally normal valve.   Cusp separation was  normal.  Doppler:  Transvalvular velocity was within the normal  range. There was no stenosis. There was no regurgitation.    VTI  ratio of LVOT to aortic valve: 0.74. Peak velocity ratio of LVOT to  aortic valve: 0.84.  Mean velocity ratio of LVOT to aortic valve:  0.86.    Mean gradient (S): 3 mm Hg. Peak gradient (S): 6 mm Hg.    -------------------------------------------------------------------  Mitral valve:   Structurally normal valve.   Leaflet separation was  normal.  Doppler:  Transvalvular velocity was within the normal  range. There was no evidence for stenosis. There was no  regurgitation.    Valve area by pressure half-time: 2.08 cm^2.  Indexed valve area by pressure half-time: 1.04 cm^2/m^2.   -------------------------------------------------------------------  Left atrium:  The atrium was normal in size.   -------------------------------------------------------------------  Right ventricle:  The cavity size was normal. Wall thickness was  normal. Systolic function was normal.   -------------------------------------------------------------------  Pulmonic valve:    Structurally normal valve.   Cusp separation was  normal.  Doppler:  Transvalvular velocity was within the normal  range. There was no regurgitation.   -------------------------------------------------------------------  Tricuspid valve:   Structurally normal valve.   Leaflet separation  was normal.  Doppler:  Transvalvular velocity was within the normal  range. There was no regurgitation.   -------------------------------------------------------------------  Right atrium:  The atrium was normal in size.   -------------------------------------------------------------------  Pericardium: The pericardium was normal in appearance.   Assessment & Plan   1.  Essential hypertension-BP today 128/64 Continue carvedilol, amlodipine Heart healthy low-sodium diet-salty 6 given Increase physical activity as tolerated  Coronary artery disease-no chest pain today.  Underwent cardiac catheterization with PCI and DES to her circumflex in 2019. Continue carvedilol, aspirin Heart healthy low-sodium diet-salty 6 given Increase  physical activity as tolerated  Hyperlipidemia-statin therapy held in the setting of elevated LFTs.  Does not wish to resume statin therapy at this time.  We used shared decision making to decide to repeat labs after resuming ezetimibe.  If lipids are not well-controlled I will send to pharmacy lipid clinic for consideration of PCSK9 inhibitor. Resume ezetimibe Repeat LFTs 8weeks  Sepsis-no longer requiring oxygen.  Has completed course of oral by antibiotics.  Feels well recovered. Increase physical activity as tolerated Follow-up with PCP  CKD stage IIIa-creatinine 1.05 at baseline.  Recovered to 1.09 with follow-up labs Follows with PCP  Disposition: Follow-up with Dr. Swaziland in 3-4 months.   Thomasene Ripple. Katelyne Galster NP-C     02/19/2022, 2:24 PM [facility] [address] Office [phone] Fax [phone]    I spent 14 minutes examining this patient, reviewing medications, and using patient centered shared decision making involving her cardiac care.  Prior to her visit I spent greater than 20 minutes reviewing her past medical history,  medications, and prior cardiac tests.

## 2022-02-19 ENCOUNTER — Ambulatory Visit: Payer: 59 | Attending: General Practice | Admitting: General Practice

## 2022-02-19 ENCOUNTER — Encounter: Payer: Self-pay | Admitting: General Practice

## 2022-02-19 VITALS — BP 128/64 | HR 75 | Ht 65.5 in | Wt 164.8 lb

## 2022-02-19 DIAGNOSIS — N179 Acute kidney failure, unspecified: Secondary | ICD-10-CM

## 2022-02-19 DIAGNOSIS — E785 Hyperlipidemia, unspecified: Secondary | ICD-10-CM | POA: Diagnosis not present

## 2022-02-19 DIAGNOSIS — E1169 Type 2 diabetes mellitus with other specified complication: Secondary | ICD-10-CM | POA: Diagnosis not present

## 2022-02-19 DIAGNOSIS — I10 Essential (primary) hypertension: Secondary | ICD-10-CM

## 2022-02-19 DIAGNOSIS — N1831 Chronic kidney disease, stage 3a: Secondary | ICD-10-CM | POA: Diagnosis not present

## 2022-02-19 DIAGNOSIS — I2510 Atherosclerotic heart disease of native coronary artery without angina pectoris: Secondary | ICD-10-CM | POA: Diagnosis not present

## 2022-02-19 MED ORDER — EZETIMIBE 10 MG PO TABS: 10.0000 mg | ORAL_TABLET | Freq: Every day | ORAL | 3 refills | Status: DC

## 2022-02-19 NOTE — Patient Instructions (Addendum)
Medication Instructions:  Start Zetia 10 mg ( Take 1 Tablet Daily). *If you need a refill on your cardiac medications before your next appointment, please call your pharmacy*   Lab Work: Lipid Panel, Hepatic panel. To Be done in 8 weeks. If you have labs (blood work) drawn today and your tests are completely normal, you will receive your results only by: [place] (if you have MyChart) OR A paper copy in the mail If you have any lab test that is abnormal or we need to change your treatment, we will call you to review the results.   Testing/Procedures: No Testing   Follow-Up: At [facility], you and your health needs are our priority.  As part of our continuing mission to provide you with exceptional heart care, we have created designated Provider Care Teams.  These Care Teams include your primary Cardiologist (physician) and Advanced Practice Providers (APPs -  Physician Assistants and Nurse Practitioners) who all work together to provide you with the care you need, when you need it.  We recommend signing up for the patient portal called "MyChart".  Sign up information is provided on this After Visit Summary.  MyChart is used to connect with patients for Virtual Visits (Telemedicine).  Patients are able to view lab/test results, encounter notes, upcoming appointments, etc.  Non-urgent messages can be sent to your provider as well.   To learn more about what you can do with MyChart, go to [email or web address].    Your next appointment:   3-4 month(s)  Provider:   Coletta Memos, FNP    or , Peter Martinique, MD    Other Instructions Increase Physical Activity As Tolerated. Recommend high Fiber Diet.

## 2022-02-25 ENCOUNTER — Ambulatory Visit: Payer: Self-pay

## 2022-02-25 NOTE — Patient Outreach (Signed)
  Care Coordination   Follow Up Visit Note   02/25/2022 Name: KARRIS DEANGELO MRN: [id number] DOB: 07-Aug-1955  [name] is a 67 y.o. year old female who sees Niue, Anupa, DO for primary care. I spoke with  [name] by phone today.  What matters to the patients health and wellness today?  Getting sugars down    Goals Addressed             This Visit's Progress    Diabetes Management       Care Coordination Interventions: Provided education to patient about basic DM disease process Reviewed medications with patient and discussed importance of medication adherence Discussed plans with patient for ongoing care management follow up and provided patient with direct contact information for care management team Patient sugars now is the 130-140 range as she has changed her diet.   Eye appointment 02-06-22-[facility] Today    Flowsheet Row Most Recent Value  Chronic Disease Discussed/Reviewed   Chronic disease discussed/reviewed during today's visit Diabetes  General Interventions   General Interventions Discussed/Reviewed General Interventions Discussed, Annual Eye Exam, Doctor Visits  Doctor Visits Discussed/Reviewed Doctor Visits Reviewed, Doctor Visits Discussed  Exercise Interventions   Exercise Discussed/Reviewed Exercise Discussed  Education Interventions   Education Provided Provided Verbal Education  Provided Verbal Education On Eye Care, Foot Care, Blood Sugar Monitoring  Nutrition Interventions   Nutrition Discussed/Reviewed Carbohydrate meal planning, Decreasing sugar intake              SDOH assessments and interventions completed:  Yes  SDOH Interventions Today    Flowsheet Row Most Recent Value  SDOH Interventions   Alcohol Usage Interventions Intervention Not Indicated (Score <7)        Care Coordination Interventions:  Yes, provided   Follow up plan: Follow up call scheduled for March    Encounter Outcome:  Pt. Visit  Completed   Jone Baseman, RN, MSN [facility] Care Management Coordinator Direct Line [phone]

## 2022-02-25 NOTE — Patient Instructions (Signed)
Visit Information  Thank you for taking time to visit with me today. Please don't hesitate to contact me if I can be of assistance to you.   Following are the goals we discussed today:   Goals Addressed             This Visit's Progress    Diabetes Management       Care Coordination Interventions: Provided education to patient about basic DM disease process Reviewed medications with patient and discussed importance of medication adherence Discussed plans with patient for ongoing care management follow up and provided patient with direct contact information for care management team Patient sugars now is the 130-140 range as she has changed her diet.   Eye appointment 02-06-22-[facility] Today    Flowsheet Row Most Recent Value  Chronic Disease Discussed/Reviewed   Chronic disease discussed/reviewed during today's visit Diabetes  General Interventions   General Interventions Discussed/Reviewed General Interventions Discussed, Annual Eye Exam, Doctor Visits  Doctor Visits Discussed/Reviewed Doctor Visits Reviewed, Doctor Visits Discussed  Exercise Interventions   Exercise Discussed/Reviewed Exercise Discussed  Education Interventions   Education Provided Provided Verbal Education  Provided Verbal Education On Eye Care, Foot Care, Blood Sugar Monitoring  Nutrition Interventions   Nutrition Discussed/Reviewed Carbohydrate meal planning, Decreasing sugar intake              Our next appointment is by telephone on 03/25/22 at 915 am  Please call the care guide team at [phone] if you need to cancel or reschedule your appointment.   If you are experiencing a Mental Health or [place] or need someone to talk to, please call the Suicide and Crisis Lifeline: 988   The patient verbalized understanding of instructions, educational materials, and care plan provided today and agreed to receive a mailed copy of patient instructions, educational  materials, and care plan.   The patient has been provided with contact information for the care management team and has been advised to call with any health related questions or concerns.    Jone Baseman, RN, MSN [facility] Care Management Coordinator Direct Line [phone]

## 2022-02-27 ENCOUNTER — Encounter: Payer: Self-pay | Admitting: Pharmacist

## 2022-02-27 ENCOUNTER — Ambulatory Visit (INDEPENDENT_AMBULATORY_CARE_PROVIDER_SITE_OTHER): Payer: 59 | Admitting: Pharmacist

## 2022-02-27 VITALS — BP 136/56 | HR 77 | Wt 167.4 lb

## 2022-02-27 DIAGNOSIS — E1165 Type 2 diabetes mellitus with hyperglycemia: Secondary | ICD-10-CM | POA: Diagnosis not present

## 2022-02-27 DIAGNOSIS — E119 Type 2 diabetes mellitus without complications: Secondary | ICD-10-CM

## 2022-02-27 MED ORDER — FREESTYLE LIBRE 3 SENSOR MISC: 11 refills | Status: DC

## 2022-02-27 NOTE — Progress Notes (Signed)
S:     Chief Complaint  Patient presents with  . Medication Management    Diabetes    67 y.o. female who presents for diabetes evaluation, education, and management.  PMH is significant for T2DM, HTN, HLD, CAD.  Patient was referred by Dr. Oleh Genin on 02/13/2022. Patient was last seen by Primary Care Provider, Dr. Larae Grooms, on 09/06/2021.  At last visit, A1c was noted to be 14.1 in the hospital. No medication changes were made to diabetes regimen and she was referred to pharmacy clinic.    Today, patient arrives in good spirits and presents with assistance of a walker. Patient reports blood sugar has been well controlled since her last visit. Reports that elevated A1c was in the setting of her not being adherent to her insulin regimen. She has since reported full adherence.   Diabetes diagnosed greater than 10 years.   Current diabetes medications include: Lantus (glargine) 32 units daily, Humalog (lispro) 14 units TID with meals, metformin 1000 mg BID, dapagliflozin (Farxiga) 10 mg daily  Current hypertension medications include: amlodipine (Norvasc) 5 mg daily, lisinopril (Zestril) 10 mg daily, carvedilol (Coreg) 12.5 mg BID  Current hyperlipidemia medications include: Omega-3 fatty acids 1000 mg daily, ezetimide (Zetia) 10 mg daily  Patient reports adherence to taking all medications as prescribed.   Insurance coverage: [facility] Medicare  Patient denies hypoglycemic events.  Reported home fasting blood sugars: All readings <130, mostly low 100s  Reported 2 hour post-meal/random blood sugars: majority 150s, none >180.  Patient denies nocturia (nighttime urination).  Patient reports neuropathy (nerve pain). Patient denies visual changes. Patient reports self foot exams.   Patient-reported exercise habits: limited by knee pain.   O:   Review of Systems  All other systems reviewed and are negative.   Physical Exam Constitutional:      Appearance: Normal appearance.  Pulmonary:      Effort: Pulmonary effort is normal.  Neurological:     Mental Status: She is alert.  Psychiatric:        Mood and Affect: Mood normal.        Behavior: Behavior normal.        Thought Content: Thought content normal.   Observed patterns:  Lab Results  Component Value Date   HGBA1C 14.1 (H) 01/19/2022   Vitals:   02/27/22 1518 02/27/22 1526  BP: (!) 143/67 (!) 136/56  Pulse: 77   SpO2: 98%     Lipid Panel     Component Value Date/Time   CHOL 266 (H) 03/06/2020 0915   TRIG 403 (H) 03/06/2020 0915   HDL 35 (L) 03/06/2020 0915   CHOLHDL 7.6 (H) 03/06/2020 0915   CHOLHDL 5.1 04/29/2017 0557   VLDL 32 04/29/2017 0557   LDLCALC 155 (H) 03/06/2020 0915   LDLDIRECT 135 (H) 12/31/2012 0949    Clinical Atherosclerotic Cardiovascular Disease (ASCVD): Yes  The 10-year ASCVD risk score (Arnett DK, et al., 2019) is: 22.8%   Values used to calculate the score:     Age: 28 years     Sex: Female     Is Non-Hispanic African American: No     Diabetic: Yes     Tobacco smoker: No     Systolic Blood Pressure: [id number] mmHg     Is BP treated: Yes     HDL Cholesterol: 35 mg/dL     Total Cholesterol: 266 mg/dL   A/P: Diabetes longstanding currently uncontrolled based on A1c of 14.1. However, at goal based on patient reported  blood sugars. Patient is able to verbalize appropriate hypoglycemia management plan. Medication adherence appears appropriate. -Continued basal insulin Lantus (insulin glargine) 32 units daily.  -Continued rapid insulin Humalog (insulin lispro) 14 units TID with meals.  -Documented intolerance (N/V) to GLP-1 (Trulicity). However, patient denies this intolerance and states that she was taken off of Trulicity due to insurance coverage. Can consider GLP-1 at follow-up.  -Defer SGLT2-I initiation until A1c closer to goal. -Continued metformin 1000 mg BID with meals.   -Patient educated on purpose, proper use, and potential adverse effects of insulin, metformin, and CGM  (Libre 3).   -Extensively discussed pathophysiology of diabetes, recommended lifestyle interventions, dietary effects on blood sugar control.  -Counseled on s/sx of and management of hypoglycemia.  -Next A1c anticipated 03/2022.   ASCVD risk - secondary prevention in patient with diabetes. Last LDL is 155 not at goal of <70 mg/dL. moderate intensity statin indicated. However, statin intolerance reported. Using shared clinical decision making, Cardiology discontinued rosuvastatin and initiated Zetia (ezetimide).    -Continued Zetia (ezetimide) 10 mg daily.   Hypertension longstanding currently close to goal. Blood pressure goal of <130/80 mmHg. Medication adherence reported. -Continue current blood pressure regimen.   Written patient instructions provided. Patient verbalized understanding of treatment plan.  Total time in face to face counseling 40 minutes.    Follow-up:  Pharmacist 5 weeks. Patient seen with Dixon Boos,  PharmD Candidate, Francena Hanly, PharmD PGY-1 Pharmacy Resident and Joseph Art, PharmD, PGY2 Pharmacy Resident.

## 2022-02-27 NOTE — Patient Instructions (Signed)
We placed a Freestyle Libre Continuous Blood Glucose Monitor today.   It was nice to see you today!  Your goal blood sugar is 80-130 before eating and less than 180 after eating.  No Medication Changes today. Call us with any issues with your new FreeStyle Libre 3 Continuous Glucose Monitor.  Keep up the good work with diet and exercise. Aim for a diet full of vegetables, fruit and lean meats (chicken, [place], fish). Try to limit salt intake by eating fresh or frozen vegetables (instead of canned), rinse canned vegetables prior to cooking and do not add any additional salt to meals.   Sensor Application If using the App, you can tap Help in the Main Menu to access an in-app tutorial on applying a Sensor. See below for instructions on how to download the app. Apply Sensors only on the back of your upper arm. If placed in other areas, the Sensor may not function properly and could give you inaccurate readings. Avoid areas with scars, moles, stretch marks, or lumps.   Select an area of skin that generally stays flat during your normal daily activities (no bending or folding). Choose a site that is at least 1 inch (2.5 cm) away from any injection sites. To prevent discomfort or skin irritation, you should select a different site other than the one most recently used. Wash application site using a plain soap, dry, and then clean with an alcohol wipe. This will help remove any oily residue that may prevent the sensor from sticking properly. Allow site to air dry before proceeding. Note: The area MUST be clean and dry, or the Sensor may not stay on for the full wear duration specified by your Sensor insert. 4. Unscrew the cap from the Sensor Applicator and set the cap aside.  5. Place the Sensor Applicator over the prepared site and push down firmly to apply the Sensor to your body. 6. Gently pull the Sensor Applicator away from your body. The Sensor should now be attached to your skin. 7. Make sure the  Sensor is secure after application. Put the cap back on the Sensor Applicator. Discard the used Engineer, civil (consulting) according to local regulations.  What If My Sensor Falls Off or What If My Sensor Isn't Working? Call [facility] Team at [phone] Available 7 days a week from 8AM-8PM EST, excluding holidays   The App Download the FreeStyle [place] 3 App in your phone's app store   Load the app and select get started now Create an account  Tap scan new sensor Follow the prompts on the screen. If your sensor does not sync, try moving your phone slowly around the sensor. Phone cases may affect scanning. This will be the only time you have to scan the sensor until you apply a new sensor in 14 days.  There will be a 60 minute start up period until the app will display your glucose reading    How To Share Your Readings With [place] Once in the app, go to settings -> connected apps -> LibreView -> Enter Practice ID -> [id number]

## 2022-02-28 NOTE — Assessment & Plan Note (Signed)
Diabetes longstanding currently uncontrolled based on A1c of 14.1. However, at goal based on patient reported blood sugars. Patient is able to verbalize appropriate hypoglycemia management plan. Medication adherence appears appropriate. -Continued basal insulin Lantus (insulin glargine) 32 units daily.  -Continued rapid insulin Humalog (insulin lispro) 14 units TID with meals.  -Documented intolerance (N/V) to GLP-1 (Trulicity). However, patient denies this intolerance and states that she was taken off of Trulicity due to insurance coverage. Can consider GLP-1 at follow-up.  -Defer SGLT2-I initiation until A1c closer to goal. -Continued metformin 1000 mg BID with meals.   -Patient educated on purpose, proper use, and potential adverse effects of insulin, metformin, and CGM (Libre 3).   -Extensively discussed pathophysiology of diabetes, recommended lifestyle interventions, dietary effects on blood sugar control.  -Counseled on s/sx of and management of hypoglycemia.  -Next A1c anticipated 03/2022.

## 2022-03-03 NOTE — Progress Notes (Signed)
Reviewed: I agree with Dr. Koval's documentation and management. 

## 2022-03-04 ENCOUNTER — Telehealth: Payer: Self-pay

## 2022-03-04 ENCOUNTER — Other Ambulatory Visit: Payer: Self-pay | Admitting: Family Medicine

## 2022-03-04 DIAGNOSIS — D508 Other iron deficiency anemias: Secondary | ICD-10-CM

## 2022-03-04 MED ORDER — FERROUS SULFATE 325 (65 FE) MG PO TABS: 325.0000 mg | ORAL_TABLET | Freq: Two times a day (BID) | ORAL | 0 refills | Status: DC

## 2022-03-04 NOTE — Telephone Encounter (Signed)
Patient calls nurse line in regards to iron tabs.   She is requesting a 90 day supply vs picking up monthly.   Will forward to for advisement.

## 2022-03-05 ENCOUNTER — Telehealth: Payer: Self-pay | Admitting: Family Medicine

## 2022-03-05 NOTE — Telephone Encounter (Signed)
Can you guys please contact Holly Acosta and inquire about BiPAP usage. I reviewed most recent report but there is no data beyond 12/30/2021. I see she was admitted to the hospital for sepsis 01/18/2022. Encourage her to resume BiPAP if she hasn't already. I will check another download in 6-8 weeks to see if the leak has improved. TY!

## 2022-03-05 NOTE — Telephone Encounter (Signed)
FYI-Called and spoke with pt. She confirmed she stopped using BIPAP d/t recent having pneumonia. She plans to restart using BIPAP this week. Aware Amy Lomax,NP will review data in another 6-8 wk to see if leak improved.

## 2022-03-16 ENCOUNTER — Other Ambulatory Visit: Payer: Self-pay | Admitting: Family Medicine

## 2022-03-24 ENCOUNTER — Ambulatory Visit: Payer: Self-pay

## 2022-03-24 NOTE — Patient Instructions (Signed)
Visit Information  Thank you for taking time to visit with me today. Please don't hesitate to contact me if I can be of assistance to you.   Following are the goals we discussed today:   Goals Addressed             This Visit's Progress    Diabetes Management       Care Coordination Interventions: Provided education to patient about basic DM disease process Reviewed medications with patient and discussed importance of medication adherence Discussed plans with patient for ongoing care management follow up and provided patient with direct contact information for care management team Patient now has Freestyle libre 3 and managing well.  Sugars in the low 100's.  Reiterated diabetes management.   Interventions Today    Flowsheet Row Most Recent Value  Chronic Disease   Chronic disease during today's visit Diabetes  General Interventions   General Interventions Discussed/Reviewed General Interventions Reviewed  Doctor Visits Discussed/Reviewed Doctor Visits Reviewed  Education Interventions   Education Provided Provided Education  Provided Verbal Education On Nutrition  Nutrition Interventions   Nutrition Discussed/Reviewed Carbohydrate meal planning, Decreasing sugar intake, Nutrition Reviewed              Our next appointment is by telephone on 04/21/22 at 0930   Please call the care guide team at [phone] if you need to cancel or reschedule your appointment.   If you are experiencing a Mental Health or [place] or need someone to talk to, please call the Suicide and Crisis Lifeline: 988   The patient verbalized understanding of instructions, educational materials, and care plan provided today and DECLINED offer to receive copy of patient instructions, educational materials, and care plan.   The patient has been provided with contact information for the care management team and has been advised to call with any health related questions or concerns.    Jone Baseman, RN, MSN [place] [facility] Care Management Coordinator Direct Line [phone]

## 2022-03-24 NOTE — Patient Outreach (Signed)
  Care Coordination   Follow Up Visit Note   03/24/2022 Name: Holly Acosta MRN: [id number] DOB: 09-16-1955  Holly Acosta is a 67 y.o. year old female who sees Niue, Anupa, DO for primary care. I spoke with  Holly Acosta by phone today.  What matters to the patients health and wellness today?  Ongoing Diabetes management    Goals Addressed             This Visit's Progress    Diabetes Management       Care Coordination Interventions: Provided education to patient about basic DM disease process Reviewed medications with patient and discussed importance of medication adherence Discussed plans with patient for ongoing care management follow up and provided patient with direct contact information for care management team Patient now has Freestyle libre 3 and managing well.  Sugars in the low 100's.  Reiterated diabetes management.   Interventions Today    Flowsheet Row Most Recent Value  Chronic Disease   Chronic disease during today's visit Diabetes  General Interventions   General Interventions Discussed/Reviewed General Interventions Reviewed  Doctor Visits Discussed/Reviewed Doctor Visits Reviewed  Education Interventions   Education Provided Provided Education  Provided Verbal Education On Nutrition  Nutrition Interventions   Nutrition Discussed/Reviewed Carbohydrate meal planning, Decreasing sugar intake, Nutrition Reviewed              SDOH assessments and interventions completed:  Yes     Care Coordination Interventions:  Yes, provided   Follow up plan: Follow up call scheduled for April    Encounter Outcome:  Pt. Visit Completed   Jone Baseman, RN, MSN [place] [facility] Care Management Coordinator Direct Line [phone]

## 2022-03-29 ENCOUNTER — Other Ambulatory Visit: Payer: Self-pay | Admitting: Family Medicine

## 2022-03-29 DIAGNOSIS — E1165 Type 2 diabetes mellitus with hyperglycemia: Secondary | ICD-10-CM

## 2022-04-03 ENCOUNTER — Ambulatory Visit: Payer: 59 | Admitting: Pharmacist

## 2022-04-07 ENCOUNTER — Other Ambulatory Visit: Payer: Self-pay | Admitting: Family Medicine

## 2022-04-10 ENCOUNTER — Encounter: Payer: Self-pay | Admitting: Pharmacist

## 2022-04-10 ENCOUNTER — Other Ambulatory Visit: Payer: Self-pay | Admitting: Family Medicine

## 2022-04-10 ENCOUNTER — Ambulatory Visit (INDEPENDENT_AMBULATORY_CARE_PROVIDER_SITE_OTHER): Payer: 59 | Admitting: Pharmacist

## 2022-04-10 VITALS — BP 138/73 | HR 76 | Wt 169.8 lb

## 2022-04-10 DIAGNOSIS — E119 Type 2 diabetes mellitus without complications: Secondary | ICD-10-CM

## 2022-04-10 DIAGNOSIS — Z794 Long term (current) use of insulin: Secondary | ICD-10-CM

## 2022-04-10 NOTE — Assessment & Plan Note (Signed)
Diabetes longstanding currently uncontrolled based on A1c, but improved control per CGM report. Patient is able to verbalize appropriate hypoglycemia management plan. Medication adherence appears appropriate. -Decreased basal insulin Lantus (insulin glargine) from 32 to 28 units daily.  -Increased rapid insulin Humalog (insulin lispro) from 14-15 to 15-16 units TID with meals.  -Continue Farxiga (dapagliflozin) 10 mg daily.  -Continued metformin 1000 mg BID with meals.   -Patient educated on purpose, proper use, and potential adverse effects of insulin, metformin, and CGM (Libre 3).

## 2022-04-10 NOTE — Patient Instructions (Addendum)
It was nice to see you today!  Your goal blood sugar is 80-130 before eating and less than 180 after eating.  Medication Changes: -Decreased Lantus (insulin glargine) to 28 units daily.  -Increased Humalog (insulin lispro) to 15-16 units with meals.   Monitor blood sugars at home and keep a log (glucometer or piece of paper) to bring with you to your next visit.  Keep up the good work with diet and exercise. Aim for a diet full of vegetables, fruit and lean meats (chicken, [place], fish). Try to limit salt intake by eating fresh or frozen vegetables (instead of canned), rinse canned vegetables prior to cooking and do not add any additional salt to meals.

## 2022-04-10 NOTE — Progress Notes (Signed)
S:    Chief Complaint  Patient presents with   Medication Management    DM f/up   67 y.o. female who presents for diabetes evaluation, education, and management.  PMH is significant for T2DM, HTN, HLD, CAD.  Patient was referred by Dr. Oleh Genin on 02/13/2022. Patient was last seen by Primary Care Provider, Dr. Larae Grooms, on 09/06/2021.  Last seen by pharmacy clinic on 02/27/2022 where FreeStyle Libre 3 CGM was started.   Today, patient arrives in good spirits and presents with assistance of a walker.  Diabetes diagnosed greater than 10 years.   Current diabetes medications include: Lantus (glargine) 32 units daily, Humalog (lispro) 14-15 units TID with meals, metformin 1000 mg BID, Farxiga (dapagliflozin) 10 mg daily  Current hypertension medications include: amlodipine 5 mg daily, lisinopril 10 mg daily, carvedilol 12.5 mg BID  Current hyperlipidemia medications include: Omega-3 fatty acids 1000 mg daily, Zetia (ezetimide)  10 mg daily  Patient reports adherence to taking all medications as prescribed.   Insurance coverage: [facility] Medicare  Patient denies hypoglycemic events.  Patient denies nocturia (nighttime urination).  Patient reports neuropathy (nerve pain). Patient denies visual changes. Patient reports self foot exams.   Patient-reported dietary habits- has noticed that biscuit   Patient-reported exercise habits: limited by knee pain.   O:   Date of Download: 04/10/2022 % Time CGM is active: 97% Average Glucose: 171 mg/dL Glucose Management Indicator: 7.4%  Glucose Variability: 34.0% (goal <36%) Time in Goal:  - Time in range 70-180: 63% - Time above range: 37% - Time below range: 0% Observed patterns: Postprandial excursions around 12 PM and 9 PM  Review of Systems  All other systems reviewed and are negative.   Physical Exam Constitutional:      Appearance: Normal appearance.  Pulmonary:     Effort: Pulmonary effort is normal.  Neurological:     Mental  Status: She is alert.  Psychiatric:        Mood and Affect: Mood normal.        Behavior: Behavior normal.        Thought Content: Thought content normal.    Observed patterns:  Lab Results  Component Value Date   HGBA1C 14.1 (H) 01/19/2022   There were no vitals filed for this visit.   Lipid Panel     Component Value Date/Time   CHOL 266 (H) 03/06/2020 0915   TRIG 403 (H) 03/06/2020 0915   HDL 35 (L) 03/06/2020 0915   CHOLHDL 7.6 (H) 03/06/2020 0915   CHOLHDL 5.1 04/29/2017 0557   VLDL 32 04/29/2017 0557   LDLCALC 155 (H) 03/06/2020 0915   LDLDIRECT 135 (H) 12/31/2012 0949    Clinical Atherosclerotic Cardiovascular Disease (ASCVD): Yes  The ASCVD Risk score (Arnett DK, et al., 2019) failed to calculate for the following reasons:   The patient has a prior MI or stroke diagnosis   A/P: Diabetes longstanding currently uncontrolled based on A1c, but improved control per CGM report. Patient is able to verbalize appropriate hypoglycemia management plan. Medication adherence appears appropriate. -Decreased basal insulin Lantus (insulin glargine) from 32 to 28 units daily.  -Increased rapid insulin Humalog (insulin lispro) from 14-15 to 15-16 units TID with meals.  -Continue Farxiga (dapagliflozin) 10 mg daily.  -Continued metformin 1000 mg BID with meals.   -Patient educated on purpose, proper use, and potential adverse effects of insulin, metformin, and CGM (Libre 3).   -Extensively discussed pathophysiology of diabetes, recommended lifestyle interventions, dietary effects on blood sugar  control.  -Counseled on s/sx of and management of hypoglycemia.  -Next A1c anticipated 03/2022.   ASCVD risk - secondary prevention in patient with diabetes. Last LDL is 155 not at goal of <70 mg/dL. moderate intensity statin indicated. However, statin intolerance reported. Using shared clinical decision making, Cardiology discontinued rosuvastatin and initiated Zetia (ezetimide).    -Continued  Zetia (ezetimide) 10 mg daily.   Hypertension longstanding currently close to goal. Blood pressure goal of <130/80 mmHg. Medication adherence reported. -Continue current blood pressure regimen.   Written patient instructions provided. Patient verbalized understanding of treatment plan.  Total time in face to face counseling 30 minutes.    Follow-up:  Pharmacist: PRN PCP: May 15, 2022 Patient seen with Joseph Art, PharmD, PGY2 Pharmacy Resident.

## 2022-04-11 NOTE — Progress Notes (Signed)
Reviewed and agree with Dr Koval's plan.   

## 2022-04-16 ENCOUNTER — Other Ambulatory Visit: Payer: Self-pay | Admitting: Family Medicine

## 2022-04-16 DIAGNOSIS — D508 Other iron deficiency anemias: Secondary | ICD-10-CM

## 2022-04-17 ENCOUNTER — Other Ambulatory Visit: Payer: Self-pay | Admitting: Family Medicine

## 2022-04-17 DIAGNOSIS — E1165 Type 2 diabetes mellitus with hyperglycemia: Secondary | ICD-10-CM

## 2022-04-21 ENCOUNTER — Ambulatory Visit: Payer: Self-pay

## 2022-04-21 NOTE — Patient Outreach (Signed)
  Care Coordination   Follow Up Visit Note   04/21/2022 Name: EMERYN STRYJEWSKI MRN: [id number] DOB: 1955/12/15  [name] is a 67 y.o. year old female who sees Niue, Anupa, DO for primary care. I spoke with  [name] by phone today.  What matters to the patients health and wellness today?  Continued diabetes managment    Goals Addressed             This Visit's Progress    Diabetes Management       Care Coordination Interventions: Provided education to patient about basic DM disease process Reviewed medications with patient and discussed importance of medication adherence Discussed plans with patient for ongoing care management follow up and provided patient with direct contact information for care management team Patient now has Freestyle libre 3 and managing well.  Sugars highest around 158.  Reenforced diabetes management.   Interventions Today    Flowsheet Row Most Recent Value  Chronic Disease   Chronic disease during today's visit Diabetes  General Interventions   General Interventions Discussed/Reviewed General Interventions Reviewed  Doctor Visits Discussed/Reviewed Doctor Visits Discussed  Exercise Interventions   Exercise Discussed/Reviewed Exercise Reviewed  Education Interventions   Education Provided Provided Education  Provided Verbal Education On Nutrition, Blood Sugar Monitoring  Mental Health Interventions   Mental Health Discussed/Reviewed Mental Health Discussed, Depression  Nutrition Interventions   Nutrition Discussed/Reviewed Nutrition Reviewed, Carbohydrate meal planning, Decreasing sugar intake, Adding fruits and vegetables, Portion sizes  Safety Interventions   Safety Discussed/Reviewed Safety Discussed              SDOH assessments and interventions completed:  Yes  SDOH Interventions Today    Flowsheet Row Most Recent Value  SDOH Interventions   Food Insecurity Interventions Intervention Not Indicated  Housing Interventions  Intervention Not Indicated        Care Coordination Interventions:  Yes, provided   Follow up plan: Follow up call scheduled for June    Encounter Outcome:  Pt. Visit Completed   Jone Baseman, RN, MSN [place] [facility] Care Management Coordinator Direct Line [phone]

## 2022-04-21 NOTE — Patient Instructions (Signed)
Visit Information  Thank you for taking time to visit with me today. Please don't hesitate to contact me if I can be of assistance to you.   Following are the goals we discussed today:   Goals Addressed             This Visit's Progress    Diabetes Management       Care Coordination Interventions: Provided education to patient about basic DM disease process Reviewed medications with patient and discussed importance of medication adherence Discussed plans with patient for ongoing care management follow up and provided patient with direct contact information for care management team Patient now has Freestyle libre 3 and managing well.  Sugars highest around 158.  Reenforced diabetes management.   Interventions Today    Flowsheet Row Most Recent Value  Chronic Disease   Chronic disease during today's visit Diabetes  General Interventions   General Interventions Discussed/Reviewed General Interventions Reviewed  Doctor Visits Discussed/Reviewed Doctor Visits Discussed  Exercise Interventions   Exercise Discussed/Reviewed Exercise Reviewed  Education Interventions   Education Provided Provided Education  Provided Verbal Education On Nutrition, Blood Sugar Monitoring  Mental Health Interventions   Mental Health Discussed/Reviewed Mental Health Discussed, Depression  Nutrition Interventions   Nutrition Discussed/Reviewed Nutrition Reviewed, Carbohydrate meal planning, Decreasing sugar intake, Adding fruits and vegetables, Portion sizes  Safety Interventions   Safety Discussed/Reviewed Safety Discussed              Our next appointment is by telephone on 06/23/22 at 0930  Please call the care guide team at [phone] if you need to cancel or reschedule your appointment.   If you are experiencing a Mental Health or [place] or need someone to talk to, please call the Suicide and Crisis Lifeline: 988   The patient verbalized understanding of instructions,  educational materials, and care plan provided today and DECLINED offer to receive copy of patient instructions, educational materials, and care plan.   The patient has been provided with contact information for the care management team and has been advised to call with any health related questions or concerns.    Jone Baseman, RN, MSN [place] [facility] Care Management Coordinator Direct Line [phone]

## 2022-05-01 ENCOUNTER — Telehealth: Payer: Self-pay

## 2022-05-01 NOTE — Telephone Encounter (Signed)
A Prior Authorization was initiated for this patients FREESTYLE LIBRE 3 SENSOR through CoverMyMeds.   Key: [id number]

## 2022-05-02 NOTE — Telephone Encounter (Signed)
Prior Auth for patients medication FREESTYLE LIBRE 3 SENSOR approved by [employer] from 01/20/22 to 01/20/23.  CoverMyMeds Key: [id number] PA Case ID #: [id number]

## 2022-05-08 ENCOUNTER — Telehealth: Payer: Self-pay

## 2022-05-08 NOTE — Telephone Encounter (Signed)
Called pt to ask her about her CPAP and pt stated that she hasn't been using her CPAP Machine because she had gotten Pneumonia. Pt states that she doesn't have Pneumonia anymore but she is still very congested and that's why she hasn't been using her CPAP Machine. Pt stated that she will start back using her CPAP Machine today 05/08/2022.

## 2022-05-15 ENCOUNTER — Ambulatory Visit: Payer: 59 | Admitting: Family Medicine

## 2022-05-26 NOTE — Progress Notes (Deleted)
Cardiology Clinic Note   Patient Name: Holly Acosta Date of Encounter: 05/26/2022  Primary Care Provider:  Reece Leader, DO Primary Cardiologist:  Peter Swaziland, MD  Patient Profile    Holly Acosta 67 year old female presents the clinic today for follow-up evaluation of her coronary artery disease and hypertension.  Past Medical History    Past Medical History:  Diagnosis Date   Acute cerebrovascular accident (CVA) of cerebellum (HCC) 04/29/2017   Acute cerebrovascular accident (CVA) of cerebellum (HCC) 04/29/2017   Acute respiratory failure with hypoxia (HCC)    AKI (acute kidney injury) (HCC)    Anemia    Arthritis of knee    "both knees" (04/24/2017)   Cerebrovascular accident (CVA) due to occlusion of left posterior cerebral artery (HCC) 12/03/2017   Chest pain 04/24/2017   COLONIC POLYPS, HYPERPLASTIC 09/13/2009   Qualifier: Diagnosis of  By: Delrae Alfred MD, Elizabeth     Constipation 06/02/2017   Coronary artery disease involving native coronary artery of native heart with unstable angina pectoris (HCC) 04/30/2017   Dehydration    Diabetes mellitus, type 2 (HCC)    History of blood transfusion    "related to prolapsed uterus"   HLD (hyperlipidemia)    HTN (hypertension)    Non-ST elevation (NSTEMI) myocardial infarction (HCC)    Non-ST elevation (NSTEMI) myocardial infarction (HCC)    Pneumonia due to COVID-19 virus 02/18/2019   Prolapsed uterus    Severe obstructive sleep apnea-hypopnea syndrome 01/01/2018   Sleep apnea    "have mask; haven't worn it for a long time" (04/24/2017)   Status post coronary artery stent placement    Past Surgical History:  Procedure Laterality Date   CARDIAC CATHETERIZATION  2011   CORONARY STENT INTERVENTION N/A 04/27/2017   Procedure: CORONARY STENT INTERVENTION;  Surgeon: Corky Crafts, MD;  Location: [facility] INVASIVE CV LAB;  Service: Cardiovascular;  Laterality: N/A;   LEFT HEART CATH AND CORONARY ANGIOGRAPHY N/A 04/27/2017   Procedure:  LEFT HEART CATH AND CORONARY ANGIOGRAPHY;  Surgeon: Corky Crafts, MD;  Location: [facility] INVASIVE CV LAB;  Service: Cardiovascular;  Laterality: N/A;   TUBAL LIGATION      Allergies  Allergies  Allergen Reactions   Lipitor [Atorvastatin] Other (See Comments)    Cramping in feet that resolved after stopping med   Diclofenac Nausea Only and Other (See Comments)    Caused a lot of stomach pain     History of Present Illness    Holly Acosta has a PMH of hypertension, coronary artery disease, OSA, HLD, type 2 diabetes, CKD stage IIIa, depression, obesity, and elevated LFTs.  Cardiac catheter cessation in 2019 with PCI and DES to her circumflex.  She was admitted to the hospital on 01/18/2022 and discharged on 01/21/22.  She was admitted with sepsis due to left lower lobe pneumonia.  She received IV antibiotics and was transitioned to oral antibiotics at discharge.  Her respiratory virus panel was negative.  She was noted to have elevated LFTs and her statin therapy was placed on hold.  It was recommended that her statin therapy and ezetimibe be restarted in 1 month after repeat LFTs.  She initially required supplemental oxygen up to 4 L which was weaned as tolerated.  A noncontrast chest CT in 6-12 months was recommended for a lung nodule that was noted on imaging.  She presents to the clinic today for follow-up evaluation and states she feels fairly well today.  She reports that she does not  want to resume statin therapy.  We reviewed her previous cardiac catheterization and need for lipid management.  She and her daughter expressed understanding.  We shared decision making to discuss strategies for high-fiber diet and lowering cholesterol.  She will resume ezetimibe.  We will repeat her fasting lipids and LFTs in 8 weeks and at that time decide if she we will need to go to pharmacy lipid clinic for consideration of PCSK9 inhibitor.  Will plan follow-up in 4 months.  Today she denies chest pain,  shortness of breath, lower extremity edema, fatigue, palpitations, melena, hematuria, hemoptysis, diaphoresis, weakness, presyncope, syncope, orthopnea, and PND.   Home Medications    Prior to Admission medications   Medication Sig Start Date End Date Taking? Authorizing Provider  Accu-Chek FastClix Lancets MISC 1 each by Does not apply route daily. Check sugar daily as needed 09/09/21   Ganta, Anupa, DO  acetaminophen (TYLENOL) 500 MG tablet Take 1,000 mg by mouth daily as needed for mild pain.    [provider]  amLODipine (NORVASC) 5 MG tablet TAKE 1 TABLET BY MOUTH EVERY DAY Patient taking differently: Take 5 mg by mouth daily. 07/17/21   [place], Peter M, MD  aspirin EC 81 MG tablet Take 1 tablet (81 mg total) by mouth daily. 03/30/14   Jamal Collin, MD  B-D UF III MINI PEN NEEDLES 31G X 5 MM MISC USE AS DIRECTED 3 TIMES A DAY 12/23/21   Ganta, Anupa, DO  Blood Glucose Monitoring Suppl (ACCU-CHEK GUIDE ME) w/Device KIT PLEASE USE TO CHECK BLOOD SUGAR UP TO THREE TIMES DAILY. 11/14/21   Ganta, Anupa, DO  carvedilol (COREG) 12.5 MG tablet TAKE 1 TABLET BY MOUTH 2 TIMES DAILY WITH A MEAL. Patient taking differently: Take 12.5 mg by mouth 2 (two) times daily with a meal. 09/13/21   Ganta, Anupa, DO  cyanocobalamin (VITAMIN B12) 1000 MCG tablet Take 1 tablet (1,000 mcg total) by mouth daily. 01/21/22 05/21/22  Kathlen Mody, MD  dapagliflozin propanediol (FARXIGA) 10 MG TABS tablet Take 1 tablet (10 mg total) by mouth daily. 03/25/21   Ganta, Anupa, DO  ezetimibe (ZETIA) 10 MG tablet TAKE 1 TABLET BY MOUTH EVERY DAY Patient taking differently: Take 10 mg by mouth daily. 05/06/21   Ganta, Anupa, DO  ferrous sulfate 325 (65 FE) MG tablet Take 1 tablet (325 mg total) by mouth 2 (two) times daily with a meal. 01/21/22   Kathlen Mody, MD  glucose blood (ACCU-CHEK GUIDE) test strip Please use to check blood sugar up to three times daily. 09/09/21   Ganta, Anupa, DO  guaiFENesin-dextromethorphan  (ROBITUSSIN DM) 100-10 MG/5ML syrup Take 5 mLs by mouth every 4 (four) hours as needed for cough. Patient not taking: Reported on 02/13/2022 01/21/22   Kathlen Mody, MD  HUMALOG KWIKPEN 100 UNIT/ML KwikPen INJECT 14 UNITS INTO THE SKIN 3 (THREE) TIMES DAILY. Patient taking differently: Inject 14-15 Units into the skin See admin instructions. Inject 14-15 units 2-3 times a day 07/05/21   Ganta, Anupa, DO  insulin glargine (LANTUS SOLOSTAR) 100 UNIT/ML Solostar Pen INJECT 32 UNITS INTO SKIN DAILY Patient taking differently: Inject 32 Units into the skin daily. 05/22/21   Ganta, Anupa, DO  lisinopril (ZESTRIL) 10 MG tablet Take 1 tablet (10 mg total) by mouth daily. 05/06/21   Reece Leader, DO  metFORMIN (GLUCOPHAGE) 1000 MG tablet Take 1 tablet (1,000 mg total) by mouth 2 (two) times daily with a meal. 03/19/21   Ganta, Anupa, DO  Omega-3 Fatty  Acids (FISH OIL) 1000 MG CAPS Take 1,000 mg by mouth daily.    [provider]  rosuvastatin (CRESTOR) 40 MG tablet Take 1 tablet (40 mg total) by mouth daily. Patient not taking: Reported on 02/13/2022 02/20/22   Kathlen Mody, MD  senna-docusate (SENOKOT-S) 8.6-50 MG tablet Take 1 tablet by mouth at bedtime as needed for mild constipation. 01/21/22   Kathlen Mody, MD    Family History    Family History  Problem Relation Age of Onset   Diabetes Mother    Hypertension Mother    Hypertension Father    She indicated that the status of her mother is unknown. She indicated that the status of her father is unknown.  Social History    Social History   Socioeconomic History   Marital status: Divorced    Spouse name: Not on file   Number of children: Not on file   Years of education: Not on file   Highest education level: Not on file  Occupational History   Not on file  Tobacco Use   Smoking status: Former    Packs/day: 1.00    Years: 33.00    Additional pack years: 0.00    Total pack years: 33.00    Types: Cigarettes    Start date: 01/20/1973     Quit date: 11/04/2006    Years since quitting: 15.5   Smokeless tobacco: Never  Vaping Use   Vaping Use: Never used  Substance and Sexual Activity   Alcohol use: Never    Alcohol/week: 0.0 standard drinks of alcohol   Drug use: Never   Sexual activity: Not Currently  Other Topics Concern   Not on file  Social History Narrative   Lives with daughter Toniann Fail age 44) and 4 grandchildren.   Disability - Arthritis. Does get disability check.   Does not have a living will yet   Social Determinants of Health   Financial Resource Strain: Not on file  Food Insecurity: No Food Insecurity (04/21/2022)   Hunger Vital Sign    Worried About Running Out of Food in the Last Year: Never true    Ran Out of Food in the Last Year: Never true  Transportation Needs: No Transportation Needs (01/22/2022)   PRAPARE - Administrator, Civil Service (Medical): No    Lack of Transportation (Non-Medical): No  Physical Activity: Not on file  Stress: Not on file  Social Connections: Not on file  Intimate Partner Violence: Not At Risk (01/19/2022)   Humiliation, Afraid, Rape, and Kick questionnaire    Fear of Current or Ex-Partner: No    Emotionally Abused: No    Physically Abused: No    Sexually Abused: No     Review of Systems    General:  No chills, fever, night sweats or weight changes.  Cardiovascular:  No chest pain, dyspnea on exertion, edema, orthopnea, palpitations, paroxysmal nocturnal dyspnea. Dermatological: No rash, lesions/masses Respiratory: No cough, dyspnea Urologic: No hematuria, dysuria Abdominal:   No nausea, vomiting, diarrhea, bright red blood per rectum, melena, or hematemesis Neurologic:  No visual changes, wkns, changes in mental status. All other systems reviewed and are otherwise negative except as noted above.  Physical Exam    VS:  There were no vitals taken for this visit. , BMI There is no height or weight on file to calculate BMI. GEN: Well nourished, well  developed, in no acute distress. HEENT: normal. Neck: Supple, no JVD, carotid bruits, or masses. Cardiac: RRR, no  murmurs, rubs, or gallops. No clubbing, cyanosis, edema.  Radials/DP/PT 2+ and equal bilaterally.  Respiratory:  Respirations regular and unlabored, clear to auscultation bilaterally. GI: Soft, nontender, nondistended, BS + x 4. MS: no deformity or atrophy. Skin: warm and dry, no rash. Neuro:  Strength and sensation are intact. Psych: Normal affect.  Accessory Clinical Findings    Recent Labs: 01/18/2022: B Natriuretic Peptide 69.0 01/20/2022: Magnesium 2.1 01/21/2022: Hemoglobin 10.5; Platelets 335 02/13/2022: ALT 11; BUN 16; Creatinine, Ser 1.09; Potassium 4.7; Sodium 142   Recent Lipid Panel    Component Value Date/Time   CHOL 266 (H) 03/06/2020 0915   TRIG 403 (H) 03/06/2020 0915   HDL 35 (L) 03/06/2020 0915   CHOLHDL 7.6 (H) 03/06/2020 0915   CHOLHDL 5.1 04/29/2017 0557   VLDL 32 04/29/2017 0557   LDLCALC 155 (H) 03/06/2020 0915   LDLDIRECT 135 (H) 12/31/2012 0949    No BP recorded.  {Refresh Note OR Click here to enter BP  :1}***    ECG personally reviewed by me today-none today.  Echocardiogram 04/29/2017  Study Conclusions   - Left ventricle: The cavity size was normal. There was moderate    concentric hypertrophy. Systolic function was normal. The    estimated ejection fraction was in the range of 55% to 60%.    Doppler parameters are consistent with abnormal left ventricular    relaxation (grade 1 diastolic dysfunction). There was no evidence    of elevated ventricular filling pressure by Doppler parameters.  - Mitral valve: There was no regurgitation. Valve area by pressure    half-time: 2.08 cm^2.  - Left atrium: The atrium was normal in size.  - Right ventricle: Systolic function was normal.  - Right atrium: The atrium was normal in size.  - Tricuspid valve: There was no regurgitation.  - Pericardium, extracardiac: The pericardium was normal in     appearance.   Impressions:   - There is hypokinesis of the basal inferior, inferolateral and mid    inferolateral walls, overall LVEF is preserved at 55-60%.   -------------------------------------------------------------------  Study data:  Comparison was made to the study of 04/25/2017.  Study  status:  Routine.  Procedure:  The patient reported no pain pre or  post test. Transthoracic echocardiography. Image quality was  adequate.  Study completion:  There were no complications.  Transthoracic echocardiography.  M-mode, limited 2D, limited  spectral Doppler, and color Doppler.  Birthdate:  Patient  birthdate: [birth year].  Age:  Patient is 67 yr old.  Sex:  Gender:  female.    BMI: 31.2 kg/m^2.  Blood pressure:     150/71  Patient  status:  Inpatient.  Study date:  Study date: 04/29/2017. Study  time: 11:59 AM.   -------------------------------------------------------------------   -------------------------------------------------------------------  Left ventricle:  The cavity size was normal. There was moderate  concentric hypertrophy. Systolic function was normal. The estimated  ejection fraction was in the range of 55% to 60%. Doppler  parameters are consistent with abnormal left ventricular relaxation  (grade 1 diastolic dysfunction). There was no evidence of elevated  ventricular filling pressure by Doppler parameters.   -------------------------------------------------------------------  Aortic valve:   Structurally normal valve.   Cusp separation was  normal.  Doppler:  Transvalvular velocity was within the normal  range. There was no stenosis. There was no regurgitation.    VTI  ratio of LVOT to aortic valve: 0.74. Peak velocity ratio of LVOT to  aortic valve: 0.84. Mean velocity ratio of LVOT to aortic  valve:  0.86.    Mean gradient (S): 3 mm Hg. Peak gradient (S): 6 mm Hg.    -------------------------------------------------------------------  Mitral valve:    Structurally normal valve.   Leaflet separation was  normal.  Doppler:  Transvalvular velocity was within the normal  range. There was no evidence for stenosis. There was no  regurgitation.    Valve area by pressure half-time: 2.08 cm^2.  Indexed valve area by pressure half-time: 1.04 cm^2/m^2.   -------------------------------------------------------------------  Left atrium:  The atrium was normal in size.   -------------------------------------------------------------------  Right ventricle:  The cavity size was normal. Wall thickness was  normal. Systolic function was normal.   -------------------------------------------------------------------  Pulmonic valve:    Structurally normal valve.   Cusp separation was  normal.  Doppler:  Transvalvular velocity was within the normal  range. There was no regurgitation.   -------------------------------------------------------------------  Tricuspid valve:   Structurally normal valve.   Leaflet separation  was normal.  Doppler:  Transvalvular velocity was within the normal  range. There was no regurgitation.   -------------------------------------------------------------------  Right atrium:  The atrium was normal in size.   -------------------------------------------------------------------  Pericardium: The pericardium was normal in appearance.   Assessment & Plan   1.  Essential hypertension-BP today 128/64 Continue carvedilol, amlodipine Heart healthy low-sodium diet-salty 6 given Increase physical activity as tolerated  Coronary artery disease-no chest pain today.  Underwent cardiac catheterization with PCI and DES to her circumflex in 2019. Continue carvedilol, aspirin Heart healthy low-sodium diet-salty 6 given Increase physical activity as tolerated  Hyperlipidemia-statin therapy held in the setting of elevated LFTs.  Does not wish to resume statin therapy at this time.  We used shared decision making to decide to repeat  labs after resuming ezetimibe.  If lipids are not well-controlled I will send to pharmacy lipid clinic for consideration of PCSK9 inhibitor. Resume ezetimibe Repeat LFTs 8weeks  Sepsis-no longer requiring oxygen.  Has completed course of oral by antibiotics.  Feels well recovered. Increase physical activity as tolerated Follow-up with PCP  CKD stage IIIa-creatinine 1.05 at baseline.  Recovered to 1.09 with follow-up labs Follows with PCP  Disposition: Follow-up with Dr. Swaziland in 3-4 months.   Thomasene Ripple. Hjalmer Iovino NP-C     05/26/2022, 2:46 PM [facility] [address] Office [phone] Fax [phone]    I spent 14 minutes examining this patient, reviewing medications, and using patient centered shared decision making involving her cardiac care.  Prior to her visit I spent greater than 20 minutes reviewing her past medical history,  medications, and prior cardiac tests.

## 2022-05-27 ENCOUNTER — Encounter: Payer: Self-pay | Admitting: Pharmacist

## 2022-05-27 NOTE — Progress Notes (Signed)
[facility])     [facility] Quality Pharmacy Team Statin Quality Measure Assessment  05/27/2022  TUANA MATTEI 1955/09/01 [id number]  Per review of chart and payor information, Ms. Fincannon has a diagnosis of cardiovascular disease but is not currently filling a statin prescription. This places patient into the [facility] (Statin Use In Patients with Cardiovascular Disease) measure for CMS.    Patient has documented trials of statins with reported muscle cramping, but no corresponding CPT codes that would exclude patient from [facility] measure.  If deemed clinically appropriate, please consider coding for Ms. Coakley's statin intolerance at tomorrow's office visit to remover her from the measure for 2024.  Code for past statin intolerance  (required annually)  Provider Requirements: Must associate code during an office visit or telehealth encounter   Drug Induced Myopathy G72.0   Myalgia (SPC ONLY) M79.1   Myositis, unspecified M60.9   Myopathy, unspecified G72.9   Rhabdomyolysis M62.82   Thank you for allowing [facility] to be a part of this patient's care.  Dellie Burns, PharmD [facility]  [facility] Clinical Pharmacist Office: [phone]

## 2022-05-28 ENCOUNTER — Ambulatory Visit: Payer: 59 | Admitting: General Practice

## 2022-05-30 ENCOUNTER — Encounter: Payer: Self-pay | Admitting: Family Medicine

## 2022-05-30 ENCOUNTER — Ambulatory Visit (INDEPENDENT_AMBULATORY_CARE_PROVIDER_SITE_OTHER): Payer: 59 | Admitting: Family Medicine

## 2022-05-30 VITALS — BP 136/75 | HR 71 | Ht 65.5 in | Wt 171.8 lb

## 2022-05-30 DIAGNOSIS — Z1231 Encounter for screening mammogram for malignant neoplasm of breast: Secondary | ICD-10-CM

## 2022-05-30 DIAGNOSIS — Z794 Long term (current) use of insulin: Secondary | ICD-10-CM | POA: Diagnosis not present

## 2022-05-30 DIAGNOSIS — Z1211 Encounter for screening for malignant neoplasm of colon: Secondary | ICD-10-CM

## 2022-05-30 DIAGNOSIS — E119 Type 2 diabetes mellitus without complications: Secondary | ICD-10-CM

## 2022-05-30 DIAGNOSIS — I10 Essential (primary) hypertension: Secondary | ICD-10-CM

## 2022-05-30 LAB — POCT GLYCOSYLATED HEMOGLOBIN (HGB A1C): HbA1c, POC (controlled diabetic range): 9.7 % — AB (ref 0.0–7.0)

## 2022-05-30 NOTE — Progress Notes (Signed)
    SUBJECTIVE:   CHIEF COMPLAINT / HPI:   Patient presents for DM follow up. She does not have a record of her glucose levels today but has CGM currently in place. Checks her blood glucose levels 4 times a day, says that they average around the 250s with the highest values that she has noticed being after meals. Denies any hypoglycemic episodes, dizziness or weakness. Last saw the eye doctor earlier in the year. Compliant on her DM regimen of farxiga 10 mg daily, 28 units of lantus daily, 14-15 units of humalog and metformin 1000 mg bid.   History of hypertension. Denies chest pain or shortness of breath. Compliant on her current antihypertensive regimen of amlodipine 5 mg daily, coreg 12.5 mg bid and lisinopril 10 mg daily which she continues to tolerate well.   OBJECTIVE:   BP 136/75   Pulse 71   Ht 5' 5.5" (1.664 m)   Wt 171 lb 12.8 oz (77.9 kg)   SpO2 99%   BMI 28.15 kg/m   General: Patient well-appearing, in no acute distress. CV: RRR, no murmurs or gallops auscultated Resp: CTAB, no wheezing, rales or rhonchi noted Ext: normal foot exam without wounds or ulcers, sensation intact, no LE edema noted bilaterally  Neuro: ambulates with assistance of walker  ASSESSMENT/PLAN:   HYPERTENSION, BENIGN ESSENTIAL -BP 136/75, at goal of <140/90 -continue current antihypertensive regimen   Insulin dependent type 2 diabetes mellitus (HCC) -Due for A1c, improved from 14.1 to 9.7 over 6 month period. No worsening neuropathy noted -foot exam performed today and normal -increased humalog mealtime insulin to 17-18 units -continue remainder of DM regimen -discussed the importance of routine ophthalmology follow up  -follow up with Dr. Raymondo Band scheduled 5/30 -instructed to follow up in 3 months with PCP for repeat A1c   Health maintenance -Due for mammogram, order placed and contact information given so that patient can schedule this. Patient agreeable.  -GI referral placed for colonoscopy  as patient agreeable to colonoscopy today. -Med rec reviewed and updated appropriately  -PHQ-9 score of 4 with negative question 9 reviewed.     Reece Leader, DO [facility]

## 2022-05-30 NOTE — Assessment & Plan Note (Signed)
-  Due for A1c, improved from 14.1 to 9.7 over 6 month period. No worsening neuropathy noted -foot exam performed today and normal -increased humalog mealtime insulin to 17-18 units -continue remainder of DM regimen -discussed the importance of routine ophthalmology follow up  -follow up with Dr. Raymondo Band scheduled 5/30 -instructed to follow up in 3 months with PCP for repeat A1c

## 2022-05-30 NOTE — Patient Instructions (Addendum)
It was great seeing you today!  Today we discussed many things. Your blood pressure looks great, keep taking all your medication.   Your A1c is 9.7! Great job, this is significantly better than the last check about 6 months ago. Please increase your mealtime insulin to 17-18 units with meals. Otherwise, keep taking all your medications as prescribed.  It is important to make sure to see the eye doctor at least once a year or as often as they recommend since diabetes can also lead to blindness as we discussed.  I have placed a referral to the GI specialist for a colonoscopy, if you do not hear from them within 2 weeks then please contact us so we can try to assist with scheduling  Please call the  number provided to schedule your mammogram, I have already placed the order!  Please follow up with Dr. Raymondo Band on 5/30 at 1:30 pm.   Please follow up at your next scheduled appointment in 3 months, if anything arises between now and then, please don't hesitate to contact our office.   Thank you for allowing [place] to be a part of your medical care!  Thank you, Dr. Robyne Peers

## 2022-05-30 NOTE — Assessment & Plan Note (Signed)
>>  ASSESSMENT AND PLAN FOR HYPERTENSION, BENIGN ESSENTIAL WRITTEN ON 05/30/2022 10:52 PM BY GANTA, ANUPA, DO  -BP 136/75, at goal of <140/90 -continue current antihypertensive regimen

## 2022-05-30 NOTE — Assessment & Plan Note (Addendum)
-  BP 136/75, at goal of <140/90 -continue current antihypertensive regimen

## 2022-06-02 ENCOUNTER — Telehealth: Payer: Self-pay

## 2022-06-02 NOTE — Telephone Encounter (Signed)
Patient calls nurse line requesting refill on vitamin b12 tablets.   She states that she discussed this medication with provider at visit on 5/10. She is requesting that this be sent into [facility] on [address].   Please advise.   Veronda Prude, RN

## 2022-06-03 ENCOUNTER — Other Ambulatory Visit: Payer: Self-pay | Admitting: Family Medicine

## 2022-06-03 DIAGNOSIS — E538 Deficiency of other specified B group vitamins: Secondary | ICD-10-CM

## 2022-06-03 MED ORDER — VITAMIN B-12 1000 MCG PO TABS: 1000.0000 ug | ORAL_TABLET | Freq: Every day | ORAL | 1 refills | Status: DC

## 2022-06-03 NOTE — Telephone Encounter (Signed)
Returned call to patient and informed that provider has sent in prescription.   Veronda Prude, RN

## 2022-06-19 ENCOUNTER — Ambulatory Visit: Payer: 59 | Admitting: Pharmacist

## 2022-06-23 ENCOUNTER — Ambulatory Visit: Payer: Self-pay

## 2022-06-23 NOTE — Patient Outreach (Addendum)
  Care Coordination   Follow Up Visit Note   06/23/2022 Name: Holly Acosta MRN: [id number] DOB: June 28, 1955  Holly Acosta is a 67 y.o. year old female who sees [place], Anupa, DO for primary care. I spoke with  Holly Acosta by phone today.  What matters to the patients health and wellness today?  Diabetes management    Goals Addressed             This Visit's Progress    Diabetes Management       Care Coordination Interventions: Provided education to patient about basic DM disease process Reviewed medications with patient and discussed importance of medication adherence Discussed plans with patient for ongoing care management follow up and provided patient with direct contact information for care management team Patient now has Freestyle libre 3 and managing well.  She reports her sugar this morning at 225. However she reports she did not eat yesterday until after 900 pm.   Encouraged diabetes management and eating meals prior to 700pm.  She verbalized understanding.               SDOH assessments and interventions completed:  Yes     Care Coordination Interventions:  Yes, provided   Follow up plan: Follow up call scheduled for July    Encounter Outcome:  Pt. Visit Completed   Bary Leriche, RN, MSN [facility] Care Management Care Management Coordinator Direct Line [phone]

## 2022-06-23 NOTE — Patient Instructions (Signed)
Visit Information  Thank you for taking time to visit with me today. Please don't hesitate to contact me if I can be of assistance to you.   Following are the goals we discussed today:   Goals Addressed             This Visit's Progress    Diabetes Management       Care Coordination Interventions: Provided education to patient about basic DM disease process Reviewed medications with patient and discussed importance of medication adherence Discussed plans with patient for ongoing care management follow up and provided patient with direct contact information for care management team Patient now has Freestyle libre 3 and managing well.  She reports her sugar this morning at 225. However she reports she did not eat yesterday until after 900 pm.   Encouraged diabetes management and eating meals prior to 700pm.  She verbalized understanding.               Our next appointment is by telephone on 07/21/22 at 0930  Please call the care guide team at [phone] if you need to cancel or reschedule your appointment.   If you are experiencing a Mental Health or Behavioral Health Crisis or need someone to talk to, please call the Suicide and Crisis Lifeline: 988   The patient verbalized understanding of instructions, educational materials, and care plan provided today and DECLINED offer to receive copy of patient instructions, educational materials, and care plan.   The patient has been provided with contact information for the care management team and has been advised to call with any health related questions or concerns.   Bary Leriche, RN, MSN [facility] Care Management Care Management Coordinator Direct Line [phone]

## 2022-06-26 ENCOUNTER — Other Ambulatory Visit: Payer: Self-pay | Admitting: Family Medicine

## 2022-06-26 ENCOUNTER — Ambulatory Visit: Payer: 59

## 2022-06-26 DIAGNOSIS — E538 Deficiency of other specified B group vitamins: Secondary | ICD-10-CM

## 2022-07-08 ENCOUNTER — Other Ambulatory Visit: Payer: Self-pay | Admitting: Family Medicine

## 2022-07-08 DIAGNOSIS — E1165 Type 2 diabetes mellitus with hyperglycemia: Secondary | ICD-10-CM

## 2022-07-08 NOTE — Progress Notes (Unsigned)
Cardiology Clinic Note   Patient Name: Holly Acosta Date of Encounter: 07/17/2022  Primary Care Provider:  Reece Leader, DO Primary Cardiologist:  Peter Swazil[place], MD  Patient Profile    Holly Acosta 67 year old female presents the clinic today for follow-up evaluation of her coronary artery disease and hypertension.  Past Medical History    Past Medical History:  Diagnosis Date   Acute cerebrovascular accident (CVA) of cerebellum (HCC) 04/29/2017   Acute cerebrovascular accident (CVA) of cerebellum (HCC) 04/29/2017   Acute respiratory failure with hypoxia (HCC)    AKI (acute kidney injury) (HCC)    Anemia    Arthritis of knee    "both knees" (04/24/2017)   Cerebrovascular accident (CVA) due to occlusion of left posterior cerebral artery (HCC) 12/03/2017   Chest pain 04/24/2017   COLONIC POLYPS, HYPERPLASTIC 09/13/2009   Qualifier: Diagnosis of  By: Delrae Alfred MD, Elizabeth     Constipation 06/02/2017   Coronary artery disease involving native coronary artery of native heart with unstable angina pectoris (HCC) 04/30/2017   Dehydration    Diabetes mellitus, type 2 (HCC)    History of blood transfusion    "related to prolapsed uterus"   HLD (hyperlipidemia)    HTN (hypertension)    Non-ST elevation (NSTEMI) myocardial infarction (HCC)    Non-ST elevation (NSTEMI) myocardial infarction (HCC)    Pneumonia due to COVID-19 virus 02/18/2019   Prolapsed uterus    Severe obstructive sleep apnea-hypopnea syndrome 01/01/2018   Sleep apnea    "have mask; haven't worn it for a long time" (04/24/2017)   Status post coronary artery stent placement    Past Surgical History:  Procedure Laterality Date   CARDIAC CATHETERIZATION  2011   CORONARY STENT INTERVENTION N/A 04/27/2017   Procedure: CORONARY STENT INTERVENTION;  Surgeon: Corky Crafts, MD;  Location: [facility] INVASIVE CV LAB;  Service: Cardiovascular;  Laterality: N/A;   LEFT HEART CATH AND CORONARY ANGIOGRAPHY N/A 04/27/2017   Procedure:  LEFT HEART CATH AND CORONARY ANGIOGRAPHY;  Surgeon: Corky Crafts, MD;  Location: [facility] INVASIVE CV LAB;  Service: Cardiovascular;  Laterality: N/A;   TUBAL LIGATION      Allergies  Allergies  Allergen Reactions   Lipitor [Atorvastatin] Other (See Comments)    Cramping in feet that resolved after stopping med   Diclofenac Nausea Only and Other (See Comments)    Caused a lot of stomach pain     History of Present Illness    Holly Acosta has a PMH of hypertension, coronary artery disease, OSA, HLD, type 2 diabetes, CKD stage IIIa, depression, obesity, and elevated LFTs.  Cardiac catheter cessation in 2019 with PCI and DES to her circumflex.  She was admitted to the hospital on 01/18/2022 and discharged on 01/21/22.  She was admitted with sepsis due to left lower lobe pneumonia.  She received IV antibiotics and was transitioned to oral antibiotics at discharge.  Her respiratory virus panel was negative.  She was noted to have elevated LFTs and her statin therapy was placed on hold.  It was recommended that her statin therapy and ezetimibe be restarted in 1 month after repeat LFTs.  She initially required supplemental oxygen up to 4 L which was weaned as tolerated.  A noncontrast chest CT in 6-12 months was recommended for a lung nodule that was noted on imaging.  She presented to the clinic 02/19/22 for follow-up evaluation and stated she felt fairly well today.  She reported that she did not  want to resume statin therapy.  We reviewed her previous cardiac catheterization and need for lipid management.  She and her daughter expressed understanding.  We shared decision making to discuss strategies for high-fiber diet and lowering cholesterol.  She planned to resume ezetimibe.  I planned to repeat her fasting lipids and LFTs in 8 weeks and at that time decide if she we will need to go to pharmacy lipid clinic for consideration of PCSK9 inhibitor.   follow-up in 4 months was planned.  Repeat liver  panel was not completed.  She presents to the clinic today for follow-up evaluation and states she had difficulties with transportation and was not able to complete her repeat LFTs.  We reviewed her previous clinic visit and she expressed understanding.  I reviewed PCSK9 inhibitors.  She denies episodes of chest discomfort palpitations and shortness of breath.  She continues to walk with a rollator.  She is planning a trip to the beach with her granddaughter and daughter.  I will refer her to the lipid clinic for evaluation for PCSK9 inhibitor.  We will repeat her liver panel today and plan follow-up in 6 to 9 months.  Today she denies chest pain, shortness of breath, lower extremity edema, fatigue, palpitations, melena, hematuria, hemoptysis, diaphoresis, weakness, presyncope, syncope, orthopnea, and PND.   Home Medications    Prior to Admission medications   Medication Sig Start Date End Date Taking? Authorizing Provider  Accu-Chek FastClix Lancets MISC 1 each by Does not apply route daily. Check sugar daily as needed 09/09/21   Ganta, Anupa, DO  acetaminophen (TYLENOL) 500 MG tablet Take 1,000 mg by mouth daily as needed for mild pain.    [provider]  amLODipine (NORVASC) 5 MG tablet TAKE 1 TABLET BY MOUTH EVERY DAY Patient taking differently: Take 5 mg by mouth daily. 07/17/21   [place], Peter M, MD  aspirin EC 81 MG tablet Take 1 tablet (81 mg total) by mouth daily. 03/30/14   Jamal Collin, MD  B-D UF III MINI PEN NEEDLES 31G X 5 MM MISC USE AS DIRECTED 3 TIMES A DAY 12/23/21   Ganta, Anupa, DO  Blood Glucose Monitoring Suppl (ACCU-CHEK GUIDE ME) w/Device KIT PLEASE USE TO CHECK BLOOD SUGAR UP TO THREE TIMES DAILY. 11/14/21   Ganta, Anupa, DO  carvedilol (COREG) 12.5 MG tablet TAKE 1 TABLET BY MOUTH 2 TIMES DAILY WITH A MEAL. Patient taking differently: Take 12.5 mg by mouth 2 (two) times daily with a meal. 09/13/21   Ganta, Anupa, DO  cyanocobalamin (VITAMIN B12) 1000 MCG tablet  Take 1 tablet (1,000 mcg total) by mouth daily. 01/21/22 05/21/22  Kathlen Mody, MD  dapagliflozin propanediol (FARXIGA) 10 MG TABS tablet Take 1 tablet (10 mg total) by mouth daily. 03/25/21   Ganta, Anupa, DO  ezetimibe (ZETIA) 10 MG tablet TAKE 1 TABLET BY MOUTH EVERY DAY Patient taking differently: Take 10 mg by mouth daily. 05/06/21   Ganta, Anupa, DO  ferrous sulfate 325 (65 FE) MG tablet Take 1 tablet (325 mg total) by mouth 2 (two) times daily with a meal. 01/21/22   Kathlen Mody, MD  glucose blood (ACCU-CHEK GUIDE) test strip Please use to check blood sugar up to three times daily. 09/09/21   Ganta, Anupa, DO  guaiFENesin-dextromethorphan (ROBITUSSIN DM) 100-10 MG/5ML syrup Take 5 mLs by mouth every 4 (four) hours as needed for cough. Patient not taking: Reported on 02/13/2022 01/21/22   Kathlen Mody, MD  HUMALOG KWIKPEN 100 UNIT/ML KwikPen INJECT  14 UNITS INTO THE SKIN 3 (THREE) TIMES DAILY. Patient taking differently: Inject 14-15 Units into the skin See admin instructions. Inject 14-15 units 2-3 times a day 07/05/21   Ganta, Anupa, DO  insulin glargine (LANTUS SOLOSTAR) 100 UNIT/ML Solostar Pen INJECT 32 UNITS INTO SKIN DAILY Patient taking differently: Inject 32 Units into the skin daily. 05/22/21   Ganta, Anupa, DO  lisinopril (ZESTRIL) 10 MG tablet Take 1 tablet (10 mg total) by mouth daily. 05/06/21   Reece Leader, DO  metFORMIN (GLUCOPHAGE) 1000 MG tablet Take 1 tablet (1,000 mg total) by mouth 2 (two) times daily with a meal. 03/19/21   Ganta, Anupa, DO  Omega-3 Fatty Acids (FISH OIL) 1000 MG CAPS Take 1,000 mg by mouth daily.    [provider]  rosuvastatin (CRESTOR) 40 MG tablet Take 1 tablet (40 mg total) by mouth daily. Patient not taking: Reported on 02/13/2022 02/20/22   Kathlen Mody, MD  senna-docusate (SENOKOT-S) 8.6-50 MG tablet Take 1 tablet by mouth at bedtime as needed for mild constipation. 01/21/22   Kathlen Mody, MD    Family History    Family History  Problem Relation  Age of Onset   Diabetes Mother    Hypertension Mother    Hypertension Father    She indicated that the status of her mother is unknown. She indicated that the status of her father is unknown.  Social History    Social History   Socioeconomic History   Marital status: Divorced    Spouse name: Not on file   Number of children: Not on file   Years of education: Not on file   Highest education level: Not on file  Occupational History   Not on file  Tobacco Use   Smoking status: Former    Packs/day: 1.00    Years: 33.00    Additional pack years: 0.00    Total pack years: 33.00    Types: Cigarettes    Start date: 01/20/1973    Quit date: 11/04/2006    Years since quitting: 15.7   Smokeless tobacco: Never  Vaping Use   Vaping Use: Never used  Substance and Sexual Activity   Alcohol use: Never    Alcohol/week: 0.0 standard drinks of alcohol   Drug use: Never   Sexual activity: Not Currently  Other Topics Concern   Not on file  Social History Narrative   Lives with daughter Holly Acosta age 74) and 4 grandchildren.   Disability - Arthritis. Does get disability check.   Does not have a living will yet   Social Determinants of Health   Financial Resource Strain: Not on file  Food Insecurity: No Food Insecurity (04/21/2022)   Hunger Vital Sign    Worried About Running Out of Food in the Last Year: Never true    Ran Out of Food in the Last Year: Never true  Transportation Needs: No Transportation Needs (01/22/2022)   PRAPARE - Administrator, Civil Service (Medical): No    Lack of Transportation (Non-Medical): No  Physical Activity: Not on file  Stress: Not on file  Social Connections: Not on file  Intimate Partner Violence: Not At Risk (01/19/2022)   Humiliation, Afraid, Rape, and Kick questionnaire    Fear of Current or Ex-Partner: No    Emotionally Abused: No    Physically Abused: No    Sexually Abused: No     Review of Systems    General:  No chills, fever,  night sweats or weight  changes.  Cardiovascular:  No chest pain, dyspnea on exertion, edema, orthopnea, palpitations, paroxysmal nocturnal dyspnea. Dermatological: No rash, lesions/masses Respiratory: No cough, dyspnea Urologic: No hematuria, dysuria Abdominal:   No nausea, vomiting, diarrhea, bright red blood per rectum, melena, or hematemesis Neurologic:  No visual changes, wkns, changes in mental status. All other systems reviewed and are otherwise negative except as noted above.  Physical Exam    VS:  BP 130/76   Pulse 73   Ht 5\' 4"  (1.626 m)   Wt 171 lb 6.4 oz (77.7 kg)   SpO2 97%   BMI 29.42 kg/m  , BMI Body mass index is 29.42 kg/m. GEN: Well nourished, well developed, in no acute distress. HEENT: normal. Neck: Supple, no JVD, carotid bruits, or masses. Cardiac: RRR, no murmurs, rubs, or gallops. No clubbing, cyanosis, edema.  Radials/DP/PT 2+ and equal bilaterally.  Respiratory:  Respirations regular and unlabored, clear to auscultation bilaterally. GI: Soft, nontender, nondistended, BS + x 4. MS: no deformity or atrophy. Skin: warm and dry, no rash. Neuro:  Strength and sensation are intact. Psych: Normal affect.  Accessory Clinical Findings    Recent Labs: 01/18/2022: B Natriuretic Peptide 69.0 01/20/2022: Magnesium 2.1 01/21/2022: Hemoglobin 10.5; Platelets 335 02/13/2022: ALT 11; BUN 16; Creatinine, Ser 1.09; Potassium 4.7; Sodium 142   Recent Lipid Panel    Component Value Date/Time   CHOL 266 (H) 03/06/2020 0915   TRIG 403 (H) 03/06/2020 0915   HDL 35 (L) 03/06/2020 0915   CHOLHDL 7.6 (H) 03/06/2020 0915   CHOLHDL 5.1 04/29/2017 0557   VLDL 32 04/29/2017 0557   LDLCALC 155 (H) 03/06/2020 0915   LDLDIRECT 135 (H) 12/31/2012 0949         ECG personally reviewed by me today-none today.  Echocardiogram 04/29/2017  Study Conclusions   - Left ventricle: The cavity size was normal. There was moderate    concentric hypertrophy. Systolic function was  normal. The    estimated ejection fraction was in the range of 55% to 60%.    Doppler parameters are consistent with abnormal left ventricular    relaxation (grade 1 diastolic dysfunction). There was no evidence    of elevated ventricular filling pressure by Doppler parameters.  - Mitral valve: There was no regurgitation. Valve area by pressure    half-time: 2.08 cm^2.  - Left atrium: The atrium was normal in size.  - Right ventricle: Systolic function was normal.  - Right atrium: The atrium was normal in size.  - Tricuspid valve: There was no regurgitation.  - Pericardium, extracardiac: The pericardium was normal in    appearance.   Impressions:   - There is hypokinesis of the basal inferior, inferolateral and mid    inferolateral walls, overall LVEF is preserved at 55-60%.   -------------------------------------------------------------------  Study data:  Comparison was made to the study of 04/25/2017.  Study  status:  Routine.  Procedure:  The patient reported no pain pre or  post test. Transthoracic echocardiography. Image quality was  adequate.  Study completion:  There were no complications.  Transthoracic echocardiography.  M-mode, limited 2D, limited  spectral Doppler, and color Doppler.  Birthdate:  Patient  birthdate: [birth year].  Age:  Patient is 67 yr old.  Sex:  Gender:  female.    BMI: 31.2 kg/m^2.  Blood pressure:     150/71  Patient  status:  Inpatient.  Study date:  Study date: 04/29/2017. Study  time: 11:59 AM.   -------------------------------------------------------------------   -------------------------------------------------------------------  Left ventricle:  The cavity size was normal. There was moderate  concentric hypertrophy. Systolic function was normal. The estimated  ejection fraction was in the range of 55% to 60%. Doppler  parameters are consistent with abnormal left ventricular relaxation  (grade 1 diastolic dysfunction). There was no evidence  of elevated  ventricular filling pressure by Doppler parameters.   -------------------------------------------------------------------  Aortic valve:   Structurally normal valve.   Cusp separation was  normal.  Doppler:  Transvalvular velocity was within the normal  range. There was no stenosis. There was no regurgitation.    VTI  ratio of LVOT to aortic valve: 0.74. Peak velocity ratio of LVOT to  aortic valve: 0.84. Mean velocity ratio of LVOT to aortic valve:  0.86.    Mean gradient (S): 3 mm Hg. Peak gradient (S): 6 mm Hg.    -------------------------------------------------------------------  Mitral valve:   Structurally normal valve.   Leaflet separation was  normal.  Doppler:  Transvalvular velocity was within the normal  range. There was no evidence for stenosis. There was no  regurgitation.    Valve area by pressure half-time: 2.08 cm^2.  Indexed valve area by pressure half-time: 1.04 cm^2/m^2.   -------------------------------------------------------------------  Left atrium:  The atrium was normal in size.   -------------------------------------------------------------------  Right ventricle:  The cavity size was normal. Wall thickness was  normal. Systolic function was normal.   -------------------------------------------------------------------  Pulmonic valve:    Structurally normal valve.   Cusp separation was  normal.  Doppler:  Transvalvular velocity was within the normal  range. There was no regurgitation.   -------------------------------------------------------------------  Tricuspid valve:   Structurally normal valve.   Leaflet separation  was normal.  Doppler:  Transvalvular velocity was within the normal  range. There was no regurgitation.   -------------------------------------------------------------------  Right atrium:  The atrium was normal in size.   -------------------------------------------------------------------  Pericardium: The pericardium  was normal in appearance.   Assessment & Plan   1. Coronary artery disease-denies chest pain.  With cardiac catheterization with PCI and DES to her circumflex in 2019. Continue carvedilol, aspirin Heart healthy low-sodium diet-salty 6 reviewed Increase physical activity as tolerated  Hyperlipidemia, statin intolerant-statin therapy previously held in the setting of elevated LFTs.  Again, does not wish to resume statin therapy at this time.   Continue ezetimibe Repeat LFTs  Refer to pharmacy lipid clinic  Essential hypertension-BP today 130/76 Maintain blood pressure log Continue carvedilol, amlodipine Heart healthy low-sodium diet Increase physical activity as tolerated  CKD stage IIIa-creatinine 1.09 on 02/13/2022. Follows with PCP  Disposition: Follow-up with Dr. Swaziland or me in 6-9 months.   Thomasene Ripple. Jalisa Sacco NP-C     07/17/2022, 11:41 AM [facility] [address] Office [phone] Fax [phone]    I spent 13 minutes examining this patient, reviewing medications, and using patient centered shared decision making involving her cardiac care.  Prior to her visit I spent greater than 20 minutes reviewing her past medical history,  medications, and prior cardiac tests.

## 2022-07-13 ENCOUNTER — Other Ambulatory Visit: Payer: Self-pay | Admitting: Cardiology

## 2022-07-14 ENCOUNTER — Other Ambulatory Visit: Payer: Self-pay | Admitting: Family Medicine

## 2022-07-14 DIAGNOSIS — D508 Other iron deficiency anemias: Secondary | ICD-10-CM

## 2022-07-16 DIAGNOSIS — G4730 Sleep apnea, unspecified: Secondary | ICD-10-CM | POA: Diagnosis not present

## 2022-07-16 DIAGNOSIS — G4733 Obstructive sleep apnea (adult) (pediatric): Secondary | ICD-10-CM | POA: Diagnosis not present

## 2022-07-17 ENCOUNTER — Ambulatory Visit: Payer: 59 | Attending: General Practice | Admitting: General Practice

## 2022-07-17 ENCOUNTER — Encounter: Payer: Self-pay | Admitting: General Practice

## 2022-07-17 VITALS — BP 130/76 | HR 73 | Ht 64.0 in | Wt 171.4 lb

## 2022-07-17 DIAGNOSIS — Z789 Other specified health status: Secondary | ICD-10-CM | POA: Diagnosis not present

## 2022-07-17 DIAGNOSIS — E785 Hyperlipidemia, unspecified: Secondary | ICD-10-CM | POA: Diagnosis not present

## 2022-07-17 DIAGNOSIS — I2510 Atherosclerotic heart disease of native coronary artery without angina pectoris: Secondary | ICD-10-CM | POA: Diagnosis not present

## 2022-07-17 DIAGNOSIS — Z794 Long term (current) use of insulin: Secondary | ICD-10-CM

## 2022-07-17 DIAGNOSIS — E1169 Type 2 diabetes mellitus with other specified complication: Secondary | ICD-10-CM | POA: Diagnosis not present

## 2022-07-17 DIAGNOSIS — I10 Essential (primary) hypertension: Secondary | ICD-10-CM | POA: Diagnosis not present

## 2022-07-17 NOTE — Patient Instructions (Signed)
Medication Instructions:  The current medical regimen is effective;  continue present plan and medications as directed. Please refer to the Current Medication list given to you today.  *If you need a refill on your cardiac medications before your next appointment, please call your pharmacy*  Lab Work: LFT TODAY If you have labs (blood work) drawn today and your tests are completely normal, you will receive your results only by:  MyChart   Message (if you have MyChart) OR  A paper copy in the mail If you have any lab test that is abnormal or we need to change your treatment, we will call you to review the results.  Testing/Procedures: REFERRAL TO LIPID CLINIC PHARMACIST  Other Instructions MAINTAIN PHYSICAL ACTIVITY AS TOLERATED PLEASE READ AND FOLLOW ATTACHED  SALTY 6  Follow-Up: At [facility], you and your health needs are our priority.  As part of our continuing mission to provide you with exceptional heart care, we have created designated Provider Care Teams.  These Care Teams include your primary Cardiologist (physician) and Advanced Practice Providers (APPs -  Physician Assistants and Nurse Practitioners) who all work together to provide you with the care you need, when you need it.  We recommend signing up for the patient portal called "MyChart".  Sign up information is provided on this After Visit Summary.  MyChart is used to connect with patients for Virtual Visits (Telemedicine).  Patients are able to view lab/test results, encounter notes, upcoming appointments, etc.  Non-urgent messages can be sent to your provider as well.   To learn more about what you can do with MyChart, go to [email or web address].    Your next appointment:   6-9 month(s)  Provider:   Peter Swaziland, MD  or Edd Fabian, FNP

## 2022-07-18 LAB — HEPATIC FUNCTION PANEL
ALT: 12 IU/L (ref 0–32)
AST: 16 IU/L (ref 0–40)
Albumin: 4.4 g/dL (ref 3.9–4.9)
Alkaline Phosphatase: 88 IU/L (ref 44–121)
Bilirubin Total: 0.2 mg/dL (ref 0.0–1.2)
Bilirubin, Direct: <0.1 mg/dL (ref 0.00–0.40)
Total Protein: 7.3 g/dL (ref 6.0–8.5)

## 2022-07-21 ENCOUNTER — Ambulatory Visit: Payer: Self-pay

## 2022-07-21 NOTE — Patient Outreach (Signed)
  Care Coordination   Follow Up Visit Note   07/21/2022 Name: Holly Acosta MRN: [id number] DOB: Jul 14, 1955  Holly Acosta is a 67 y.o. year old female who sees Everhart, Kirstie, DO for primary care. I spoke with  Holly Acosta by phone today.  What matters to the patients health and wellness today?  Diabetes management    Goals Addressed             This Visit's Progress    Diabetes Management       Care Coordination Interventions: Provided education to patient about basic DM disease process Reviewed medications with patient and discussed importance of medication adherence Discussed plans with patient for ongoing care management follow up and provided patient with direct contact information for care management team Patient now has Freestyle libre 3 and managing well.  She reports her sugar this morning at 174. Encouraged diabetes management.  She verbalized understanding.               SDOH assessments and interventions completed:  Yes  SDOH Interventions Today    Flowsheet Row Most Recent Value  SDOH Interventions   Housing Interventions Intervention Not Indicated  Transportation Interventions Intervention Not Indicated        Care Coordination Interventions:  Yes, provided   Follow up plan: Follow up call scheduled for September    Encounter Outcome:  Pt. Visit Completed   Bary Leriche, RN, MSN [facility] Care Management Care Management Coordinator Direct Line [phone]

## 2022-07-21 NOTE — Patient Instructions (Addendum)
Visit Information  Thank you for taking time to visit with me today. Please don't hesitate to contact me if I can be of assistance to you.   Following are the goals we discussed today:   Goals Addressed             This Visit's Progress    Diabetes Management       Care Coordination Interventions: Provided education to patient about basic DM disease process Reviewed medications with patient and discussed importance of medication adherence Discussed plans with patient for ongoing care management follow up and provided patient with direct contact information for care management team Patient now has Freestyle libre 3 and managing well.  She reports her sugar this morning at 174. Encouraged diabetes management.  She verbalized understanding.               Our next appointment is by telephone on 09/23/22 at 0930  Please call the care guide team at [phone] if you need to cancel or reschedule your appointment.   If you are experiencing a Mental Health or Behavioral Health Crisis or need someone to talk to, please call the Suicide and Crisis Lifeline: 988   The patient verbalized understanding of instructions, educational materials, and care plan provided today and DECLINED offer to receive copy of patient instructions, educational materials, and care plan.   The patient has been provided with contact information for the care management team and has been advised to call with any health related questions or concerns.   Bary Leriche, RN, MSN [facility] Care Management Care Management Coordinator Direct Line [phone]

## 2022-07-23 ENCOUNTER — Telehealth: Payer: Self-pay | Admitting: Pharmacist

## 2022-07-23 NOTE — Telephone Encounter (Signed)
Pharmacy Quality Measure Review  This patient is appearing on a report for being at risk of failing the Glycemic Status Assessment in Diabetes measure this calendar year.    Last documented GMI 10.3 on 07/17/2022   Called to assess reasons for CGM being OFF for 1 week in patient with poor control.   Patients states her arm was getting sore with last sensor and removed it early.  She plans to replace sensor later today.  We discussed her glucose control and how she is improved over the last few days of the most recent 14 day CGM period of assessment (ending 6/27).   She states she is doing well.  When offered a follow-up  option, she was willing to schedule with her New PCP, Dr. Rexene Alberts.  Appointment for PCP visit scheduled 09/04/2022.    Patient appreciated the follow-up.  A1C anticipated at next PCP visit.

## 2022-07-24 NOTE — Patient Instructions (Addendum)
Holly Acosta , Thank you for taking time to come for your Medicare Wellness Visit. I appreciate your ongoing commitment to your health goals. Please review the following plan we discussed and let me know if I can assist you in the future.   These are the goals we discussed:  Goals      Diabetes Management     Care Coordination Interventions: Provided education to patient about basic DM disease process Reviewed medications with patient and discussed importance of medication adherence Discussed plans with patient for ongoing care management follow up and provided patient with direct contact information for care management team Patient now has Freestyle libre 3 and managing well.  She reports her sugar this morning at 174. Encouraged diabetes management.  She verbalized understanding.            HEMOGLOBIN A1C < 8     Remain active and independent        This is a list of the screening recommended for you and due dates:  Health Maintenance  Topic Date Due   COVID-19 Vaccine (1) Never done   Yearly kidney health urinalysis for diabetes  Never done   Zoster (Shingles) Vaccine (1 of 2) 10/25/2022*   Pneumonia Vaccine (4 of 4 - PPSV23 or PCV20) 07/25/2023*   Mammogram  07/25/2023*   DEXA scan (bone density measurement)  07/25/2023*   Colon Cancer Screening  07/25/2023*   Flu Shot  08/21/2022   Hemoglobin A1C  11/30/2022   Eye exam for diabetics  02/07/2023   Yearly kidney function blood test for diabetes  02/14/2023   Complete foot exam   05/30/2023   Medicare Annual Wellness Visit  07/25/2023   DTaP/Tdap/Td vaccine (3 - Td or Tdap) 03/17/2029   Hepatitis C Screening  Completed   HPV Vaccine  Aged Out  *Topic was postponed. The date shown is not the original due date.    Advanced directives: Information on Advanced Care Planning can be found at [facility] Advance Health Care Directives Advance Health Care Directives ([email or web address] Please bring a copy of your  health care power of attorney and living will to the office to be added to your chart at your convenience.   Conditions/risks identified: Aim for 30 minutes of exercise or brisk walking, 6-8 glasses of water, and 5 servings of fruits and vegetables each day.  Next appointment: Follow up in one year for your annual wellness visit    Preventive Care 65 Years and Older, Female Preventive care refers to lifestyle choices and visits with your health care provider that can promote health and wellness. What does preventive care include? A yearly physical exam. This is also called an annual well check. Dental exams once or twice a year. Routine eye exams. Ask your health care provider how often you should have your eyes checked. Personal lifestyle choices, including: Daily care of your teeth and gums. Regular physical activity. Eating a healthy diet. Avoiding tobacco and drug use. Limiting alcohol use. Practicing safe sex. Taking low-dose aspirin every day. Taking vitamin and mineral supplements as recommended by your health care provider. What happens during an annual well check? The services and screenings done by your health care provider during your annual well check will depend on your age, overall health, lifestyle risk factors, and family history of disease. Counseling  Your health care provider may ask you questions about your: Alcohol use. Tobacco use. Drug use. Emotional well-being. Home and relationship well-being. Sexual activity. Eating habits.  History of falls. Memory and ability to understand (cognition). Work and work Astronomer. Reproductive health. Screening  You may have the following tests or measurements: Height, weight, and BMI. Blood pressure. Lipid and cholesterol levels. These may be checked every 5 years, or more frequently if you are over 4 years old. Skin check. Lung cancer screening. You may have this screening every year starting at age 42 if you  have a 30-pack-year history of smoking and currently smoke or have quit within the past 15 years. Fecal occult blood test (FOBT) of the stool. You may have this test every year starting at age 86. Flexible sigmoidoscopy or colonoscopy. You may have a sigmoidoscopy every 5 years or a colonoscopy every 10 years starting at age 3. Hepatitis C blood test. Hepatitis B blood test. Sexually transmitted disease (STD) testing. Diabetes screening. This is done by checking your blood sugar (glucose) after you have not eaten for a while (fasting). You may have this done every 1-3 years. Bone density scan. This is done to screen for osteoporosis. You may have this done starting at age 26. Mammogram. This may be done every 1-2 years. Talk to your health care provider about how often you should have regular mammograms. Talk with your health care provider about your test results, treatment options, and if necessary, the need for more tests. Vaccines  Your health care provider may recommend certain vaccines, such as: Influenza vaccine. This is recommended every year. Tetanus, diphtheria, and acellular pertussis (Tdap, Td) vaccine. You may need a Td booster every 10 years. Zoster vaccine. You may need this after age 57. Pneumococcal 13-valent conjugate (PCV13) vaccine. One dose is recommended after age 82. Pneumococcal polysaccharide (PPSV23) vaccine. One dose is recommended after age 87. Talk to your health care provider about which screenings and vaccines you need and how often you need them. This information is not intended to replace advice given to you by your health care provider. Make sure you discuss any questions you have with your health care provider. Document Released: 02/02/2015 Document Revised: 09/26/2015 Document Reviewed: 11/07/2014 Elsevier Interactive Patient Education  2017 [employer].  Fall Prevention in the Home Falls can cause injuries. They can happen to people of all ages. There are  many things you can do to make your home safe and to help prevent falls. What can I do on the outside of my home? Regularly fix the edges of walkways and driveways and fix any cracks. Remove anything that might make you trip as you walk through a door, such as a raised step or threshold. Trim any bushes or trees on the path to your home. Use bright outdoor lighting. Clear any walking paths of anything that might make someone trip, such as rocks or tools. Regularly check to see if handrails are loose or broken. Make sure that both sides of any steps have handrails. Any raised decks and porches should have guardrails on the edges. Have any leaves, snow, or ice cleared regularly. Use sand or salt on walking paths during winter. Clean up any spills in your garage right away. This includes oil or grease spills. What can I do in the bathroom? Use night lights. Install grab bars by the toilet and in the tub and shower. Do not use towel bars as grab bars. Use non-skid mats or decals in the tub or shower. If you need to sit down in the shower, use a plastic, non-slip stool. Keep the floor dry. Clean up any water that spills  on the floor as soon as it happens. Remove soap buildup in the tub or shower regularly. Attach bath mats securely with double-sided non-slip rug tape. Do not have throw rugs and other things on the floor that can make you trip. What can I do in the bedroom? Use night lights. Make sure that you have a light by your bed that is easy to reach. Do not use any sheets or blankets that are too big for your bed. They should not hang down onto the floor. Have a firm chair that has side arms. You can use this for support while you get dressed. Do not have throw rugs and other things on the floor that can make you trip. What can I do in the kitchen? Clean up any spills right away. Avoid walking on wet floors. Keep items that you use a lot in easy-to-reach places. If you need to reach  something above you, use a strong step stool that has a grab bar. Keep electrical cords out of the way. Do not use floor polish or wax that makes floors slippery. If you must use wax, use non-skid floor wax. Do not have throw rugs and other things on the floor that can make you trip. What can I do with my stairs? Do not leave any items on the stairs. Make sure that there are handrails on both sides of the stairs and use them. Fix handrails that are broken or loose. Make sure that handrails are as long as the stairways. Check any carpeting to make sure that it is firmly attached to the stairs. Fix any carpet that is loose or worn. Avoid having throw rugs at the top or bottom of the stairs. If you do have throw rugs, attach them to the floor with carpet tape. Make sure that you have a light switch at the top of the stairs and the bottom of the stairs. If you do not have them, ask someone to add them for you. What else can I do to help prevent falls? Wear shoes that: Do not have high heels. Have rubber bottoms. Are comfortable and fit you well. Are closed at the toe. Do not wear sandals. If you use a stepladder: Make sure that it is fully opened. Do not climb a closed stepladder. Make sure that both sides of the stepladder are locked into place. Ask someone to hold it for you, if possible. Clearly mark and make sure that you can see: Any grab bars or handrails. First and last steps. Where the edge of each step is. Use tools that help you move around (mobility aids) if they are needed. These include: Canes. Walkers. Scooters. Crutches. Turn on the lights when you go into a dark area. Replace any light bulbs as soon as they burn out. Set up your furniture so you have a clear path. Avoid moving your furniture around. If any of your floors are uneven, fix them. If there are any pets around you, be aware of where they are. Review your medicines with your doctor. Some medicines can make you  feel dizzy. This can increase your chance of falling. Ask your doctor what other things that you can do to help prevent falls. This information is not intended to replace advice given to you by your health care provider. Make sure you discuss any questions you have with your health care provider. Document Released: 11/02/2008 Document Revised: 06/14/2015 Document Reviewed: 02/10/2014 Elsevier Interactive Patient Education  2017 [employer].

## 2022-07-24 NOTE — Progress Notes (Signed)
Subjective:   Holly Acosta is a 67 y.o. female who presents for an Initial Medicare Annual Wellness Visit.  Visit Complete: Virtual  I connected with  [name] on 07/25/22 by a audio enabled telemedicine application and verified that I am speaking with the correct person using two identifiers.  Patient Location: Home  Provider Location: Home Office  I discussed the limitations of evaluation and management by telemedicine. The patient expressed understanding and agreed to proceed.  Review of Systems     Cardiac Risk Factors include: advanced age (>38men, >71 women);dyslipidemia;diabetes mellitus;hypertension;sedentary lifestyle     Objective:    Today's Vitals   07/25/22 1139  Weight: 171 lb (77.6 kg)  Height: 5\' 4"  (1.626 m)   Body mass index is 29.35 kg/m.     07/25/2022   11:46 AM 05/30/2022   11:05 AM 02/13/2022    1:49 PM 01/19/2022    4:28 AM 01/18/2022    3:31 PM 09/06/2021   10:46 AM 10/09/2020    2:07 PM  Advanced Directives  Does Patient Have a Medical Advance Directive? No No No  No No No  Would patient like information on creating a medical advance directive? Yes (MAU/Ambulatory/Procedural Areas - Information given)  No - Patient declined No - Patient declined  No - Patient declined No - Patient declined    Current Medications (verified) Outpatient Encounter Medications as of 07/25/2022  Medication Sig   Accu-Chek FastClix Lancets MISC 1 each by Does not apply route daily. Check sugar daily as needed   acetaminophen (TYLENOL) 500 MG tablet Take 1,000 mg by mouth daily as needed for mild pain.   amLODipine (NORVASC) 5 MG tablet TAKE 1 TABLET BY MOUTH EVERY DAY   aspirin EC 81 MG tablet Take 1 tablet (81 mg total) by mouth daily.   B-D UF III MINI PEN NEEDLES 31G X 5 MM MISC USE AS DIRECTED 3 TIMES A DAY   Blood Glucose Monitoring Suppl (ACCU-CHEK GUIDE ME) w/Device KIT PLEASE USE TO CHECK BLOOD SUGAR UP TO THREE TIMES DAILY.   carvedilol (COREG) 12.5 MG  tablet TAKE 1 TABLET BY MOUTH 2 TIMES DAILY WITH A MEAL. (Patient taking differently: Take 12.5 mg by mouth 2 (two) times daily with a meal.)   Continuous Blood Gluc Sensor (FREESTYLE LIBRE 3 SENSOR) MISC Place 1 sensor on the skin every 14 days. Use to check glucose continuously   CVS VITAMIN B12 1000 MCG tablet TAKE 1 TABLET BY MOUTH EVERY DAY   dapagliflozin propanediol (FARXIGA) 10 MG TABS tablet TAKE 1 TABLET BY MOUTH EVERY DAY   ezetimibe (ZETIA) 10 MG tablet Take 1 tablet (10 mg total) by mouth daily.   ferrous sulfate 325 (65 FE) MG tablet TAKE 1 TABLET BY MOUTH TWICE A DAY WITH FOOD   glucose blood (ACCU-CHEK GUIDE) test strip Please use to check blood sugar up to three times daily.   guaiFENesin-dextromethorphan (ROBITUSSIN DM) 100-10 MG/5ML syrup Take 5 mLs by mouth every 4 (four) hours as needed for cough.   insulin glargine (LANTUS SOLOSTAR) 100 UNIT/ML Solostar Pen INJECT 32 UNITS INTO SKIN DAILY   insulin lispro (HUMALOG KWIKPEN) 100 UNIT/ML KwikPen Inject 14-15 Units into the skin See admin instructions. Inject 14-15 units 2-3 times a day   lisinopril (ZESTRIL) 10 MG tablet TAKE 1 TABLET BY MOUTH EVERY DAY   metFORMIN (GLUCOPHAGE) 1000 MG tablet TAKE 1 TABLET (1,000 MG TOTAL) BY MOUTH TWICE A DAY WITH FOOD   Omega-3 Fatty Acids (  FISH OIL) 1000 MG CAPS Take 1,000 mg by mouth daily.   SEMGLEE, YFGN, 100 UNIT/ML Pen Inject into the skin daily.   senna-docusate (SENOKOT-S) 8.6-50 MG tablet Take 1 tablet by mouth at bedtime as needed for mild constipation.   No facility-administered encounter medications on file as of 07/25/2022.    Allergies (verified) Lipitor [atorvastatin] and Diclofenac   History: Past Medical History:  Diagnosis Date   Acute cerebrovascular accident (CVA) of cerebellum (HCC) 04/29/2017   Acute cerebrovascular accident (CVA) of cerebellum (HCC) 04/29/2017   Acute respiratory failure with hypoxia (HCC)    AKI (acute kidney injury) (HCC)    Anemia    Arthritis  of knee    "both knees" (04/24/2017)   Cerebrovascular accident (CVA) due to occlusion of left posterior cerebral artery (HCC) 12/03/2017   Chest pain 04/24/2017   COLONIC POLYPS, HYPERPLASTIC 09/13/2009   Qualifier: Diagnosis of  By: Delrae Alfred MD, Elizabeth     Constipation 06/02/2017   Coronary artery disease involving native coronary artery of native heart with unstable angina pectoris (HCC) 04/30/2017   Dehydration    Diabetes mellitus, type 2 (HCC)    History of blood transfusion    "related to prolapsed uterus"   HLD (hyperlipidemia)    HTN (hypertension)    Non-ST elevation (NSTEMI) myocardial infarction (HCC)    Non-ST elevation (NSTEMI) myocardial infarction (HCC)    Pneumonia due to COVID-19 virus 02/18/2019   Prolapsed uterus    Severe obstructive sleep apnea-hypopnea syndrome 01/01/2018   Sleep apnea    "have mask; haven't worn it for a long time" (04/24/2017)   Status post coronary artery stent placement    Past Surgical History:  Procedure Laterality Date   CARDIAC CATHETERIZATION  2011   CORONARY STENT INTERVENTION N/A 04/27/2017   Procedure: CORONARY STENT INTERVENTION;  Surgeon: Corky Crafts, MD;  Location: [facility] INVASIVE CV LAB;  Service: Cardiovascular;  Laterality: N/A;   LEFT HEART CATH AND CORONARY ANGIOGRAPHY N/A 04/27/2017   Procedure: LEFT HEART CATH AND CORONARY ANGIOGRAPHY;  Surgeon: Corky Crafts, MD;  Location: [facility] INVASIVE CV LAB;  Service: Cardiovascular;  Laterality: N/A;   TUBAL LIGATION     Family History  Problem Relation Age of Onset   Diabetes Mother    Hypertension Mother    Hypertension Father    Social History   Socioeconomic History   Marital status: Divorced    Spouse name: Not on file   Number of children: Not on file   Years of education: Not on file   Highest education level: Not on file  Occupational History   Not on file  Tobacco Use   Smoking status: Former    Packs/day: 1.00    Years: 33.00    Additional pack years: 0.00     Total pack years: 33.00    Types: Cigarettes    Start date: 01/20/1973    Quit date: 11/04/2006    Years since quitting: 15.7   Smokeless tobacco: Never  Vaping Use   Vaping Use: Never used  Substance and Sexual Activity   Alcohol use: Never    Alcohol/week: 0.0 standard drinks of alcohol   Drug use: Never   Sexual activity: Not Currently  Other Topics Concern   Not on file  Social History Narrative   Lives with daughter Toniann Fail age 19) and 4 grandchildren.   Disability - Arthritis. Does get disability check.   Does not have a living will yet   Social Determinants of Health  Financial Resource Strain: Low Risk  (07/25/2022)   Overall Financial Resource Strain (CARDIA)    Difficulty of Paying Living Expenses: Not hard at all  Food Insecurity: No Food Insecurity (07/25/2022)   Hunger Vital Sign    Worried About Running Out of Food in the Last Year: Never true    Ran Out of Food in the Last Year: Never true  Transportation Needs: No Transportation Needs (07/25/2022)   PRAPARE - Administrator, Civil Service (Medical): No    Lack of Transportation (Non-Medical): No  Physical Activity: Inactive (07/25/2022)   Exercise Vital Sign    Days of Exercise per Week: 0 days    Minutes of Exercise per Session: 0 min  Stress: No Stress Concern Present (07/25/2022)   [employer] of Occupational Health - Occupational Stress Questionnaire    Feeling of Stress : Not at all  Social Connections: Moderately Isolated (07/25/2022)   Social Connection and Isolation Panel [NHANES]    Frequency of Communication with Friends and Family: More than three times a week    Frequency of Social Gatherings with Friends and Family: Three times a week    Attends Religious Services: More than 4 times per year    Active Member of Clubs or Organizations: No    Attends Banker Meetings: Never    Marital Status: Divorced    Tobacco Counseling Counseling given: Not Answered   Clinical  Intake:  Pre-visit preparation completed: Yes  Pain : No/denies pain     Diabetes: Yes CBG done?: No Did pt. bring in CBG monitor from home?: No  How often do you need to have someone help you when you read instructions, pamphlets, or other written materials from your doctor or pharmacy?: 1 - Never  Interpreter Needed?: No  Information entered by :: Kandis Fantasia LPN   Activities of Daily Living    07/25/2022   11:45 AM 01/19/2022    8:40 AM  In your present state of health, do you have any difficulty performing the following activities:  Hearing? 0   Vision? 0   Difficulty concentrating or making decisions? 0   Walking or climbing stairs? 1   Dressing or bathing? 0   Doing errands, shopping? 1 0  Preparing Food and eating ? N   Using the Toilet? N   In the past six months, have you accidently leaked urine? N   Do you have problems with loss of bowel control? N   Managing your Medications? N   Managing your Finances? N   Housekeeping or managing your Housekeeping? N     Patient Care Team: Para March, DO as PCP - General (Family Medicine) [place], Peter M, MD as PCP - Cardiology (Cardiology) Fleeta Emmer, RN as [facility] Care Management Sherryll Burger, Chyrel Masson, MD as Consulting Physician (Ophthalmology) Shawnie Dapper, NP as Nurse Practitioner (Family Medicine)  Indicate any recent Medical Services you may have received from other than [facility] providers in the past year (date may be approximate).     Assessment:   This is a routine wellness examination for [facility].  Hearing/Vision screen Hearing Screening - Comments:: Denies hearing difficulties   Vision Screening - Comments:: Wears rx glasses - up to date with routine eye exams with Dr.Shah    Dietary issues and exercise activities discussed:     Goals Addressed             This Visit's Progress    Remain active and independent  Depression Screen    07/25/2022   11:42 AM  07/21/2022    9:45 AM 05/30/2022   11:04 AM 04/21/2022    9:28 AM 02/25/2022    9:26 AM 02/13/2022    1:49 PM 02/04/2022    9:38 AM  PHQ 2/9 Scores  PHQ - 2 Score 0 0 1 0 0 2 0  PHQ- 9 Score 3  4   4      Fall Risk    07/25/2022   11:45 AM 07/21/2022    9:46 AM 05/30/2022   11:04 AM 04/21/2022    9:29 AM 02/13/2022    1:49 PM  Fall Risk   Falls in the past year? 0 0 0 0 0  Number falls in past yr: 0  0  0  Injury with Fall? 0  0  0  Risk for fall due to : Impaired balance/gait;Impaired mobility      Follow up Falls prevention discussed;Education provided;Falls evaluation completed        MEDICARE RISK AT HOME:  Medicare Risk at Home - 07/25/22 1145     Any stairs in or around the home? No    If so, are there any without handrails? No    Home free of loose throw rugs in walkways, pet beds, electrical cords, etc? Yes    Adequate lighting in your home to reduce risk of falls? Yes    Life alert? No    Use of a cane, walker or w/c? Yes    Grab bars in the bathroom? Yes    Shower chair or bench in shower? No    Elevated toilet seat or a handicapped toilet? No             TIMED UP AND GO:  Was the test performed? No    Cognitive Function:        07/25/2022   11:46 AM  6CIT Screen  What Year? 0 points  What month? 0 points  What time? 0 points  Count back from 20 0 points  Months in reverse 0 points  Repeat phrase 0 points  Total Score 0 points    Immunizations Immunization History  Administered Date(s) Administered   Influenza Split 11/04/2011   Influenza Whole 01/26/2009, 10/26/2009   Influenza,inj,Quad PF,6+ Mos 10/06/2013, 10/26/2014, 11/10/2017, 03/18/2019, 03/06/2020, 10/09/2020   Pneumococcal Conjugate-13 10/06/2013   Pneumococcal Polysaccharide-23 08/19/2007, 05/07/2017   Td 08/19/2007   Tdap 03/18/2019    TDAP status: Up to date  Pneumococcal vaccine status: Due, Education has been provided regarding the importance of this vaccine. Advised may receive this  vaccine at local pharmacy or Health Dept. Aware to provide a copy of the vaccination record if obtained from local pharmacy or Health Dept. Verbalized acceptance and understanding.  Covid-19 vaccine status: Information provided on how to obtain vaccines.   Qualifies for Shingles Vaccine? Yes   Zostavax completed No   Shingrix Completed?: No.    Education has been provided regarding the importance of this vaccine. Patient has been advised to call insurance company to determine out of pocket expense if they have not yet received this vaccine. Advised may also receive vaccine at local pharmacy or Health Dept. Verbalized acceptance and understanding.  Screening Tests Health Maintenance  Topic Date Due   COVID-19 Vaccine (1) Never done   Diabetic kidney evaluation - Urine ACR  Never done   Zoster Vaccines- Shingrix (1 of 2) 10/25/2022 (Originally 08/26/2005)   Pneumonia Vaccine 49+ Years old (4 of 4 -  PPSV23 or PCV20) 07/25/2023 (Originally 05/08/2022)   MAMMOGRAM  07/25/2023 (Originally 04/26/2022)   DEXA SCAN  07/25/2023 (Originally 08/26/2020)   Colonoscopy  07/25/2023 (Originally 01/21/2020)   INFLUENZA VACCINE  08/21/2022   HEMOGLOBIN A1C  11/30/2022   OPHTHALMOLOGY EXAM  02/07/2023   Diabetic kidney evaluation - eGFR measurement  02/14/2023   FOOT EXAM  05/30/2023   Medicare Annual Wellness (AWV)  07/25/2023   DTaP/Tdap/Td (3 - Td or Tdap) 03/17/2029   Hepatitis C Screening  Completed   HPV VACCINES  Aged Out    Health Maintenance  Health Maintenance Due  Topic Date Due   COVID-19 Vaccine (1) Never done   Diabetic kidney evaluation - Urine ACR  Never done    Colorectal cancer status:  Patient declines at this time   Mammogram status:  Patient declines at this time  Bone density status:  Patient declines at this time  Lung Cancer Screening: (Low Dose CT Chest recommended if Age 76-80 years, 20 pack-year currently smoking OR have quit w/in 15years.) does not qualify.   Lung Cancer  Screening Referral: n/a  Additional Screening:  Hepatitis C Screening: does qualify; Completed 07/31/16  Vision Screening: Recommended annual ophthalmology exams for early detection of glaucoma and other disorders of the eye. Is the patient up to date with their annual eye exam?  Yes  Who is the provider or what is the name of the office in which the patient attends annual eye exams? Dr. Sherryll Burger If pt is not established with a provider, would they like to be referred to a provider to establish care? No .   Dental Screening: Recommended annual dental exams for proper oral hygiene  Diabetic Foot Exam: Diabetic Foot Exam: Completed 05/30/22  Community Resource Referral / Chronic Care Management: CRR required this visit?  No   CCM required this visit?  No     Plan:     I have personally reviewed and noted the following in the patient's chart:   Medical and social history Use of alcohol, tobacco or illicit drugs  Current medications and supplements including opioid prescriptions. Patient is not currently taking opioid prescriptions. Functional ability and status Nutritional status Physical activity Advanced directives List of other physicians Hospitalizations, surgeries, and ER visits in previous 12 months Vitals Screenings to include cognitive, depression, and falls Referrals and appointments  In addition, I have reviewed and discussed with patient certain preventive protocols, quality metrics, and best practice recommendations. A written personalized care plan for preventive services as well as general preventive health recommendations were provided to patient.     Kandis Fantasia [place], [place]   01/25/1094   After Visit Summary: (Mail) Due to this being a telephonic visit, the after visit summary with patients personalized plan was offered to patient via mail   Nurse Notes: Patient is requesting an rx for a transport wheelchair be sent to [facility].

## 2022-07-25 ENCOUNTER — Ambulatory Visit (INDEPENDENT_AMBULATORY_CARE_PROVIDER_SITE_OTHER): Payer: 59

## 2022-07-25 VITALS — Ht 64.0 in | Wt 171.0 lb

## 2022-07-25 DIAGNOSIS — Z0000 Encounter for general adult medical examination without abnormal findings: Secondary | ICD-10-CM

## 2022-07-25 NOTE — Telephone Encounter (Signed)
Reviewed and agree with Dr Koval's plan.   

## 2022-08-01 ENCOUNTER — Other Ambulatory Visit: Payer: Self-pay

## 2022-08-01 MED ORDER — BD PEN NEEDLE MINI U/F 31G X 5 MM MISC: 2 refills | Status: DC

## 2022-08-08 DIAGNOSIS — E113293 Type 2 diabetes mellitus with mild nonproliferative diabetic retinopathy without macular edema, bilateral: Secondary | ICD-10-CM | POA: Diagnosis not present

## 2022-08-12 ENCOUNTER — Ambulatory Visit: Payer: 59 | Admitting: Pharmacist

## 2022-08-12 ENCOUNTER — Encounter: Payer: Self-pay | Admitting: Pharmacist

## 2022-08-12 DIAGNOSIS — T466X5A Adverse effect of antihyperlipidemic and antiarteriosclerotic drugs, initial encounter: Secondary | ICD-10-CM

## 2022-08-12 DIAGNOSIS — Z5309 Procedure and treatment not carried out because of other contraindication: Secondary | ICD-10-CM

## 2022-08-12 DIAGNOSIS — E785 Hyperlipidemia, unspecified: Secondary | ICD-10-CM | POA: Diagnosis not present

## 2022-08-12 DIAGNOSIS — E1169 Type 2 diabetes mellitus with other specified complication: Secondary | ICD-10-CM

## 2022-08-12 DIAGNOSIS — I25110 Atherosclerotic heart disease of native coronary artery with unstable angina pectoris: Secondary | ICD-10-CM | POA: Diagnosis not present

## 2022-08-12 DIAGNOSIS — G720 Drug-induced myopathy: Secondary | ICD-10-CM | POA: Diagnosis not present

## 2022-08-12 NOTE — Progress Notes (Unsigned)
Patient ID: Holly Acosta                 DOB: 1955/08/19                    MRN: [id number]     HPI: Holly Acosta is a 67 y.o. female patient referred to lipid clinic by Edd Fabian. PMH is significant for T2DM, CAD, HTN, CVA, CKD, and statin intolerance.  Patient presents today to discuss lipid management. Has had myalgias on atorvastatin, rosuvastatin and pravastatin. Currently managed on Zetia once daily.  Denies chest pain and SOB.   Reports diet is improving. A1c has decreased from 14.1 to 9.7 in last 6 months.  Has history of elevated LFTs, possibly statin induced.  Last lipid panel drawn in 2022.  Current Medications: Zetia 10mg  daily  Intolerances:  Atorvastatin 40mg  Rosuvastatin 40mg  Pravastatin 40mg   Risk Factors:  CAD CVA T2DM   LDL goal: <55  Past Medical History:  Diagnosis Date   Acute cerebrovascular accident (CVA) of cerebellum (HCC) 04/29/2017   Acute cerebrovascular accident (CVA) of cerebellum (HCC) 04/29/2017   Acute respiratory failure with hypoxia (HCC)    AKI (acute kidney injury) (HCC)    Anemia    Arthritis of knee    "both knees" (04/24/2017)   Cerebrovascular accident (CVA) due to occlusion of left posterior cerebral artery (HCC) 12/03/2017   Chest pain 04/24/2017   COLONIC POLYPS, HYPERPLASTIC 09/13/2009   Qualifier: Diagnosis of  By: Delrae Alfred MD, Elizabeth     Constipation 06/02/2017   Coronary artery disease involving native coronary artery of native heart with unstable angina pectoris (HCC) 04/30/2017   Dehydration    Diabetes mellitus, type 2 (HCC)    History of blood transfusion    "related to prolapsed uterus"   HLD (hyperlipidemia)    HTN (hypertension)    Non-ST elevation (NSTEMI) myocardial infarction (HCC)    Non-ST elevation (NSTEMI) myocardial infarction (HCC)    Pneumonia due to COVID-19 virus 02/18/2019   Prolapsed uterus    Severe obstructive sleep apnea-hypopnea syndrome 01/01/2018   Sleep apnea    "have mask; haven't worn  it for a long time" (04/24/2017)   Status post coronary artery stent placement     Current Outpatient Medications on File Prior to Visit  Medication Sig Dispense Refill   Accu-Chek FastClix Lancets MISC 1 each by Does not apply route daily. Check sugar daily as needed 102 each 3   acetaminophen (TYLENOL) 500 MG tablet Take 1,000 mg by mouth daily as needed for mild pain.     amLODipine (NORVASC) 5 MG tablet TAKE 1 TABLET BY MOUTH EVERY DAY 90 tablet 3   aspirin EC 81 MG tablet Take 1 tablet (81 mg total) by mouth daily. 90 tablet 3   Blood Glucose Monitoring Suppl (ACCU-CHEK GUIDE ME) w/Device KIT PLEASE USE TO CHECK BLOOD SUGAR UP TO THREE TIMES DAILY. 1 kit 1   carvedilol (COREG) 12.5 MG tablet TAKE 1 TABLET BY MOUTH 2 TIMES DAILY WITH A MEAL. (Patient taking differently: Take 12.5 mg by mouth 2 (two) times daily with a meal.) 180 tablet 3   Continuous Blood Gluc Sensor (FREESTYLE LIBRE 3 SENSOR) MISC Place 1 sensor on the skin every 14 days. Use to check glucose continuously 2 each 11   CVS VITAMIN B12 1000 MCG tablet TAKE 1 TABLET BY MOUTH EVERY DAY 90 tablet 1   dapagliflozin propanediol (FARXIGA) 10 MG TABS tablet TAKE 1 TABLET BY MOUTH  EVERY DAY 90 tablet 1   ezetimibe (ZETIA) 10 MG tablet Take 1 tablet (10 mg total) by mouth daily. 90 tablet 3   ferrous sulfate 325 (65 FE) MG tablet TAKE 1 TABLET BY MOUTH TWICE A DAY WITH FOOD 180 tablet 0   glucose blood (ACCU-CHEK GUIDE) test strip Please use to check blood sugar up to three times daily. 300 strip 11   guaiFENesin-dextromethorphan (ROBITUSSIN DM) 100-10 MG/5ML syrup Take 5 mLs by mouth every 4 (four) hours as needed for cough. 118 mL 0   insulin glargine (LANTUS SOLOSTAR) 100 UNIT/ML Solostar Pen INJECT 32 UNITS INTO SKIN DAILY 15 mL 11   insulin lispro (HUMALOG KWIKPEN) 100 UNIT/ML KwikPen Inject 14-15 Units into the skin See admin instructions. Inject 14-15 units 2-3 times a day 3 mL 3   Insulin Pen Needle (B-D UF III MINI PEN NEEDLES)  31G X 5 MM MISC USE AS DIRECTED 3 TIMES A DAY 100 each 2   lisinopril (ZESTRIL) 10 MG tablet TAKE 1 TABLET BY MOUTH EVERY DAY 90 tablet 3   metFORMIN (GLUCOPHAGE) 1000 MG tablet TAKE 1 TABLET (1,000 MG TOTAL) BY MOUTH TWICE A DAY WITH FOOD 180 tablet 3   Omega-3 Fatty Acids (FISH OIL) 1000 MG CAPS Take 1,000 mg by mouth daily.     SEMGLEE, YFGN, 100 UNIT/ML Pen Inject into the skin daily.     senna-docusate (SENOKOT-S) 8.6-50 MG tablet Take 1 tablet by mouth at bedtime as needed for mild constipation.     No current facility-administered medications on file prior to visit.    Allergies  Allergen Reactions   Lipitor [Atorvastatin] Other (See Comments)    Cramping in feet that resolved after stopping med   Diclofenac Nausea Only and Other (See Comments)    Caused a lot of stomach pain     Assessment/Plan:  1. Hyperlipidemia - Patient last LDL was 155 however this was on 03/06/20. Currently only managed on Zetia. Needs updated lipid panel and then PCSK9i.  Using demo pen, educated patient on mechanism of action, storage, site selection, administration, and possible adverse effects. Patient able to demonstrate in room. Will complete PA after patient updates lipid panel    Update lipid panel Start Repatha/Praluent q 2 weeks  Laural Golden, PharmD, BCACP, CDCES, CPP [address] [place], [place], [address] Phone: [phone], Fax: [phone]

## 2022-08-12 NOTE — Patient Instructions (Signed)
It was nice meeting you today  We would like your LDL (bad cholesterol) to be less than 55  Please continue your ezetimibe 10mg  once a day  The medication we discussed today is called Repatha which you would inject once every 2 weeks  Once you update your lab work, I will complete the prior authorization and contact you when it is approved  Once you start the medication we will recheck your cholesterol in about 3 months  Please call with any questions  Laural Golden, PharmD, BCACP, CDCES, CPP [address] [place], [place], [address] Phone: [phone], Fax: [phone]

## 2022-08-14 ENCOUNTER — Other Ambulatory Visit: Payer: Self-pay

## 2022-08-14 DIAGNOSIS — E785 Hyperlipidemia, unspecified: Secondary | ICD-10-CM | POA: Diagnosis not present

## 2022-08-14 DIAGNOSIS — N179 Acute kidney failure, unspecified: Secondary | ICD-10-CM | POA: Diagnosis not present

## 2022-08-14 DIAGNOSIS — I10 Essential (primary) hypertension: Secondary | ICD-10-CM | POA: Diagnosis not present

## 2022-08-14 DIAGNOSIS — I2510 Atherosclerotic heart disease of native coronary artery without angina pectoris: Secondary | ICD-10-CM | POA: Diagnosis not present

## 2022-08-14 DIAGNOSIS — E1169 Type 2 diabetes mellitus with other specified complication: Secondary | ICD-10-CM

## 2022-08-14 DIAGNOSIS — I25110 Atherosclerotic heart disease of native coronary artery with unstable angina pectoris: Secondary | ICD-10-CM

## 2022-08-14 DIAGNOSIS — N1831 Chronic kidney disease, stage 3a: Secondary | ICD-10-CM | POA: Diagnosis not present

## 2022-08-14 DIAGNOSIS — Z5309 Procedure and treatment not carried out because of other contraindication: Secondary | ICD-10-CM

## 2022-08-15 ENCOUNTER — Telehealth: Payer: Self-pay | Admitting: Pharmacist

## 2022-08-15 DIAGNOSIS — I25110 Atherosclerotic heart disease of native coronary artery with unstable angina pectoris: Secondary | ICD-10-CM

## 2022-08-15 DIAGNOSIS — E1169 Type 2 diabetes mellitus with other specified complication: Secondary | ICD-10-CM

## 2022-08-15 NOTE — Telephone Encounter (Signed)
Please complete PA for Repatha or Praluent 

## 2022-08-18 ENCOUNTER — Telehealth: Payer: Self-pay

## 2022-08-18 ENCOUNTER — Other Ambulatory Visit (HOSPITAL_COMMUNITY): Payer: Self-pay

## 2022-08-18 MED ORDER — REPATHA SURECLICK 140 MG/ML ~~LOC~~ SOAJ
1.0000 mL | SUBCUTANEOUS | 1 refills | Status: DC
Start: 2022-08-18 — End: 2022-12-03

## 2022-08-18 NOTE — Telephone Encounter (Signed)
Contact patient and went over lipid panel results and told her that Repatha was approved. Patient voiced understanding.

## 2022-08-18 NOTE — Telephone Encounter (Signed)
   Per Test Claim: P/a for repatha is on file until 12.31.24

## 2022-08-18 NOTE — Addendum Note (Signed)
Addended by: Cheree Ditto on: 08/18/2022 04:23 PM   Modules accepted: Orders

## 2022-08-21 DIAGNOSIS — G4733 Obstructive sleep apnea (adult) (pediatric): Secondary | ICD-10-CM | POA: Diagnosis not present

## 2022-08-21 DIAGNOSIS — G4730 Sleep apnea, unspecified: Secondary | ICD-10-CM | POA: Diagnosis not present

## 2022-09-03 ENCOUNTER — Telehealth: Payer: Self-pay | Admitting: Family Medicine

## 2022-09-03 NOTE — Telephone Encounter (Signed)
Pt was called, message was relayed, she will keep her upcoming appointment in Nov.

## 2022-09-03 NOTE — Progress Notes (Unsigned)
    SUBJECTIVE:   CHIEF COMPLAINT / HPI:   Insulin-dependent T2DM: -Patient has not been wearing CGM monitor because it has been hurting her arm.  She is not really sure what her BGL's at home have been. -Patient has been taking 14 to 15 units of Humalog prior to meals.  Patient states that she just takes the same amount before every meal without checking the BGL -Patient last saw eye doctor in February -Patient is currently taking Farxiga 10 mg, 32 units Lantus, 14 to 15 units Humalog, 1000 mg metformin twice daily -Denies any hypoglycemic episodes, denies dizziness, denies weakness  Patient also requests referral for hearing test.  States that her family has had feels like she has not been hearing them over the last few months.  Denies any trauma. Will send referral to audiology   PERTINENT  PMH / PSH: CAD, HTN, HLD, CKD3a  OBJECTIVE:   BP 135/88   Pulse 98   Ht 5\' 4"  (1.626 m)   Wt 176 lb 6.4 oz (80 kg)   SpO2 97%   BMI 30.28 kg/m   Gen: Well appearing, no acute distress Ambulating with walker  ASSESSMENT/PLAN:   Insulin dependent type 2 diabetes mellitus (HCC) - A1c Today 10.2, increase lantus to 36 units - Try to wear CGM in a different spot (recommended chest) that is not a skin fold or area of high pressure. Pt is going to try this and if still having issues then switch to doing manual checks with a monitor - urine microalbumin/creatinine collected today - continue rest of diabetic regimen - farxiga 10mg , 14-15u humalog, 1000mg  bid metformin - Dr. Raymondo Band also spoke with pt today, encouraged use of CGM - f/u in 3 months  Difficulty hearing Will send referral to audiology for hearing test     Para March, DO [facility]

## 2022-09-03 NOTE — Telephone Encounter (Signed)
Pt states her DME told her to reach out to provider to request a new machine since she has had her current one for over 5 years, please call pt to discuss.

## 2022-09-03 NOTE — Telephone Encounter (Signed)
Below noted.  

## 2022-09-04 ENCOUNTER — Encounter: Payer: Self-pay | Admitting: Family Medicine

## 2022-09-04 ENCOUNTER — Ambulatory Visit: Payer: 59 | Admitting: Family Medicine

## 2022-09-04 ENCOUNTER — Other Ambulatory Visit: Payer: Self-pay

## 2022-09-04 VITALS — BP 135/88 | HR 98 | Ht 64.0 in | Wt 176.4 lb

## 2022-09-04 DIAGNOSIS — Z794 Long term (current) use of insulin: Secondary | ICD-10-CM | POA: Diagnosis not present

## 2022-09-04 DIAGNOSIS — H9190 Unspecified hearing loss, unspecified ear: Secondary | ICD-10-CM

## 2022-09-04 DIAGNOSIS — E119 Type 2 diabetes mellitus without complications: Secondary | ICD-10-CM | POA: Diagnosis not present

## 2022-09-04 DIAGNOSIS — E1165 Type 2 diabetes mellitus with hyperglycemia: Secondary | ICD-10-CM

## 2022-09-04 LAB — POCT GLYCOSYLATED HEMOGLOBIN (HGB A1C): HbA1c, POC (controlled diabetic range): 10.2 % — AB (ref 0.0–7.0)

## 2022-09-04 MED ORDER — LANTUS SOLOSTAR 100 UNIT/ML ~~LOC~~ SOPN
PEN_INJECTOR | SUBCUTANEOUS | 11 refills | Status: DC
Start: 2022-09-04 — End: 2023-03-20

## 2022-09-04 NOTE — Patient Instructions (Signed)
Good to see you today - Thank you for coming in  Things we discussed today: Diabetes - Your A1C is 10.2 That is elevated. Please try moving your monitor to another site with no skin folds or pressure, like your chest. We will follow up with myself and Dr. Raymondo Band in a few weeks to readdress this.   Please increase your lantus to 36 units.   Please always bring your medication bottles

## 2022-09-04 NOTE — Assessment & Plan Note (Signed)
Will send referral to audiology for hearing test

## 2022-09-04 NOTE — Assessment & Plan Note (Addendum)
-   A1c Today 10.2, increase lantus to 36 units - Try to wear CGM in a different spot (recommended chest) that is not a skin fold or area of high pressure. Pt is going to try this and if still having issues then switch to doing manual checks with a monitor - urine microalbumin/creatinine collected today - continue rest of diabetic regimen - farxiga 10mg , 14-15u humalog, 1000mg  bid metformin - Dr. Raymondo Band also spoke with pt today, encouraged use of CGM - f/u in 3 months

## 2022-09-05 LAB — MICROALBUMIN / CREATININE URINE RATIO
Creatinine, Urine: 28.8 mg/dL
Microalb/Creat Ratio: 125 mg/g{creat} — ABNORMAL HIGH (ref 0–29)
Microalbumin, Urine: 36 ug/mL

## 2022-09-16 ENCOUNTER — Other Ambulatory Visit: Payer: Self-pay

## 2022-09-16 DIAGNOSIS — E1165 Type 2 diabetes mellitus with hyperglycemia: Secondary | ICD-10-CM

## 2022-09-17 NOTE — Telephone Encounter (Signed)
Received refill request for diabetic testing strips. Called pt to discuss how her CGM monitor was doing and if she needed testing strips. She said that it was an automatic refill request from the pharmacy because she has been tolerating her CGM monitor on her arm without issue. She will keep [place] updated if anything changes. Appreciative of the call.

## 2022-09-23 ENCOUNTER — Ambulatory Visit: Payer: Self-pay

## 2022-09-23 ENCOUNTER — Ambulatory Visit (AMBULATORY_SURGERY_CENTER): Payer: 59 | Admitting: *Deleted

## 2022-09-23 ENCOUNTER — Other Ambulatory Visit: Payer: Self-pay

## 2022-09-23 VITALS — Ht 64.5 in | Wt 174.0 lb

## 2022-09-23 DIAGNOSIS — Z1211 Encounter for screening for malignant neoplasm of colon: Secondary | ICD-10-CM

## 2022-09-23 MED ORDER — PEG 3350-KCL-NA BICARB-NACL 420 G PO SOLR
4000.0000 mL | Freq: Once | ORAL | 0 refills | Status: AC
Start: 2022-09-23 — End: 2022-09-23

## 2022-09-23 MED ORDER — CARVEDILOL 12.5 MG PO TABS: 12.5000 mg | ORAL_TABLET | Freq: Two times a day (BID) | ORAL | 3 refills | Status: DC

## 2022-09-23 NOTE — Progress Notes (Signed)
Pt's name and DOB verified at the beginning of the pre-visit.  Pt denies any difficulty with ambulating,sitting, laying down or rolling side to side Gave both [facility] main # and MD on call # prior to instructions.  No egg or soy allergy known to patient  No issues known to pt with past sedation with any surgeries or procedures Pt denies having issues being intubated Pt has no issues moving head neck or swallowing No FH of Malignant Hyperthermia Pt is not on diet pills Pt is not on home 02  Pt is not on blood thinners  Pt has frequent issues with constipation RN instructed pt to use Miralax per bottles instructions a week before prep days. Pt states they will Pt is not on dialysis Pt denise any abnormal heart rhythms  Pt denies any upcoming cardiac testing Pt encouraged to use to use Singlecare or Goodrx to reduce cost  Patient's chart reviewed by Cathlyn Parsons CNRA prior to pre-visit and patient appropriate for the [facility].  Pre-visit completed and red dot placed by patient's name on their procedure day (on provider's schedule).  . Visit by phone Pt states weight is 174 lvb Instructed pt why it is important to and  to call if they have any changes in health or new medications. Directed them to the # given and on instructions.   Pt states they will.  Instructions reviewed with pt and pt states understanding. Instructed to review again prior to procedure. Pt states they will.  Instructions sent by mail with coupon and by my chart

## 2022-09-23 NOTE — Patient Outreach (Signed)
  Care Coordination   Follow Up Visit Note   09/23/2022 Name: MANESHA SIMISON MRN: [id number] DOB: 18-Jun-1955  [name] is a 67 y.o. year old female who sees Everhart, Kirstie, DO for primary care. I spoke with  [name] by phone today.  What matters to the patients health and wellness today?  Managing diabetes    Goals Addressed             This Visit's Progress    Diabetes Management       Care Coordination Interventions: Provided education to patient about basic DM disease process Reviewed medications with patient and discussed importance of medication adherence Discussed plans with patient for ongoing care management follow up and provided patient with direct contact information for care management team Patient now has Freestyle libre 3 and managing well.  She reports her sugar last check was 185.  A1c 10.2.  Stressed to patient the importance of continuing to limit carbohydrates and sweets.  Patient reports that iron and b12 no longer covered by insurance.  Advised patient that she could pay out of pocket for the prescription or use her u card benefit to purchase OTC. She verbalized understanding.               SDOH assessments and interventions completed:  Yes  SDOH Interventions Today    Flowsheet Row Most Recent Value  SDOH Interventions   Food Insecurity Interventions Intervention Not Indicated  Housing Interventions Intervention Not Indicated  Transportation Interventions Intervention Not Indicated        Care Coordination Interventions:  Yes, provided   Follow up plan: Follow up call scheduled for November    Encounter Outcome:  Pt. Visit Completed   Bary Leriche, RN, MSN [place]  [facility], [facility] Management Community Coordinator Direct Dial: [phone]  Fax: [phone] Website: [email or web address]

## 2022-09-23 NOTE — Patient Instructions (Signed)
Visit Information  Thank you for taking time to visit with me today. Please don't hesitate to contact me if I can be of assistance to you.   Following are the goals we discussed today:   Goals Addressed             This Visit's Progress    Diabetes Management       Care Coordination Interventions: Provided education to patient about basic DM disease process Reviewed medications with patient and discussed importance of medication adherence Discussed plans with patient for ongoing care management follow up and provided patient with direct contact information for care management team Patient now has Freestyle libre 3 and managing well.  She reports her sugar last check was 185.  A1c 10.2.  Stressed to patient the importance of continuing to limit carbohydrates and sweets.  Patient reports that iron and b12 no longer covered by insurance.  Advised patient that she could pay out of pocket for the prescription or use her u card benefit to purchase OTC. She verbalized understanding.               Our next appointment is by telephone on 11/25/22 at 930 am  Please call the care guide team at [phone] if you need to cancel or reschedule your appointment.   If you are experiencing a Mental Health or Behavioral Health Crisis or need someone to talk to, please call the Suicide and Crisis Lifeline: 988   The patient verbalized understanding of instructions, educational materials, and care plan provided today and DECLINED offer to receive copy of patient instructions, educational materials, and care plan.   The patient has been provided with contact information for the care management team and has been advised to call with any health related questions or concerns.   Bary Leriche, RN, MSN [facility], [facility] Management Community Coordinator Direct Dial: [phone]  Fax: [phone] Website: [email or web address]

## 2022-10-02 ENCOUNTER — Ambulatory Visit: Payer: 59 | Attending: Family Medicine | Admitting: Audiologist

## 2022-10-02 DIAGNOSIS — H903 Sensorineural hearing loss, bilateral: Secondary | ICD-10-CM | POA: Insufficient documentation

## 2022-10-08 ENCOUNTER — Other Ambulatory Visit: Payer: Self-pay

## 2022-10-08 ENCOUNTER — Encounter: Payer: 59 | Admitting: Gastroenterology

## 2022-10-08 DIAGNOSIS — E1165 Type 2 diabetes mellitus with hyperglycemia: Secondary | ICD-10-CM

## 2022-10-08 MED ORDER — INSULIN LISPRO (1 UNIT DIAL) 100 UNIT/ML (KWIKPEN): 14.0000 [IU] | PEN_INJECTOR | SUBCUTANEOUS | 3 refills | Status: DC

## 2022-10-08 MED ORDER — FREESTYLE LIBRE 3 SENSOR MISC
11 refills | Status: DC
Start: 2022-10-08 — End: 2022-11-17

## 2022-10-09 ENCOUNTER — Other Ambulatory Visit: Payer: Self-pay

## 2022-10-14 DIAGNOSIS — G4730 Sleep apnea, unspecified: Secondary | ICD-10-CM | POA: Diagnosis not present

## 2022-10-14 DIAGNOSIS — G4733 Obstructive sleep apnea (adult) (pediatric): Secondary | ICD-10-CM | POA: Diagnosis not present

## 2022-10-16 DIAGNOSIS — G4733 Obstructive sleep apnea (adult) (pediatric): Secondary | ICD-10-CM | POA: Diagnosis not present

## 2022-10-16 DIAGNOSIS — G4730 Sleep apnea, unspecified: Secondary | ICD-10-CM | POA: Diagnosis not present

## 2022-10-20 ENCOUNTER — Ambulatory Visit: Payer: 59 | Admitting: Audiologist

## 2022-10-20 DIAGNOSIS — H903 Sensorineural hearing loss, bilateral: Secondary | ICD-10-CM

## 2022-10-20 NOTE — Procedures (Signed)
Outpatient Audiology and [facility] [address] [place], [place]  [address] [phone]  AUDIOLOGICAL  EVALUATION  NAME: Holly Acosta     DOB:   06-25-1955      MRN: [id number]                                                                                     DATE: 10/20/2022     REFERENT: Para March, DO STATUS: Outpatient DIAGNOSIS: Sensorineural hearing loss bilateral  History: [name] was seen for an audiological evaluation due to difficulty hearing her grandchildren clearly.  Her grandchildren are teenagers and adults and she has trouble understanding them.  They encouraged her to go get a hearing test.  She denies any pain pressure or ringing in either ear.  She has never had a hearing test before.  She does have medical history of diabetes which is a risk factor for hearing loss.  Evaluation:  Otoscopy showed a clear view of the tympanic membranes, bilaterally Tympanometry results were consistent with normal middle ear function bilaterally Audiometric testing was completed using Conventional Audiometry techniques with insert earphones. Test results are consistent with normal sloping to moderate sensorineural hearing loss bilaterally. Speech Recognition Thresholds were obtained at 40 dB HL in the right ear and at 45 dB HL in the left ear. Word Recognition Testing was completed at 40 dB SL and [name] scored 80% in the right ear and 88% in the left ear.  Results:  The test results were reviewed with [name].  She has bilateral sensorineural sloping hearing loss.  This is an age-related hearing loss.  She needs hearing aids for both ears.  She does not feel she is ready for hearing aids.  [name] was counseled that she definitely need them within the next few years and to have a hearing test annually until then.  Recommendations: 1.   Hearing aids recommended for both years.  List of local providers and a copy of the hearing test provided the patient.  Have hearing tested  annually.  28 minutes spent testing and counseling on results.   If you have any questions please feel free to contact me at [phone].  Ammie Ferrier Audiologist, Au.D., CCC-A 10/20/2022  2:29 PM  Cc: Everhart, Kirstie, DO

## 2022-10-23 ENCOUNTER — Other Ambulatory Visit: Payer: Self-pay | Admitting: Family Medicine

## 2022-11-04 ENCOUNTER — Telehealth: Payer: Self-pay | Admitting: Gastroenterology

## 2022-11-04 NOTE — Telephone Encounter (Signed)
Patient called stated she is feeling under the weather and rescheduled for tomorrow.

## 2022-11-05 ENCOUNTER — Encounter: Payer: 59 | Admitting: Gastroenterology

## 2022-11-13 DIAGNOSIS — G4730 Sleep apnea, unspecified: Secondary | ICD-10-CM | POA: Diagnosis not present

## 2022-11-13 DIAGNOSIS — G4733 Obstructive sleep apnea (adult) (pediatric): Secondary | ICD-10-CM | POA: Diagnosis not present

## 2022-11-17 ENCOUNTER — Other Ambulatory Visit: Payer: Self-pay

## 2022-11-17 DIAGNOSIS — E1165 Type 2 diabetes mellitus with hyperglycemia: Secondary | ICD-10-CM

## 2022-11-17 MED ORDER — FREESTYLE LIBRE 3 SENSOR MISC
11 refills | Status: DC
Start: 2022-11-17 — End: 2023-09-10

## 2022-11-25 ENCOUNTER — Ambulatory Visit: Payer: Self-pay

## 2022-11-25 NOTE — Patient Instructions (Signed)
Visit Information  Thank you for taking time to visit with me today. Please don't hesitate to contact me if I can be of assistance to you.   Following are the goals we discussed today:   Goals Addressed             This Visit's Progress    Diabetes Management       Care Coordination Interventions: Provided education to patient about basic DM disease process Reviewed medications with patient and discussed importance of medication adherence Discussed plans with patient for ongoing care management follow up and provided patient with direct contact information for care management team  Spoke with patient. She reports she is about the same.  Blood sugar management continues to be a challenge.  Reiterated importance of low carb diet and exercise being key to management.  She verbalized understanding.  She reports she has her colonoscopy scheduled for this month.  Encouraged getting the flu vaccine. Patient plans to get it later this month.  Patient asked about resources for hot water heater as hers went out.  She states she has tried resources given to no avail.  Discussed [employer] with her.  Information given to patient to call to inquire.          Our next appointment is by telephone on 01/26/22 at 0930  Please call the care guide team at [phone] if you need to cancel or reschedule your appointment.   If you are experiencing a Mental Health or Behavioral Health Crisis or need someone to talk to, please call the Suicide and Crisis Lifeline: 988   Patient verbalizes understanding of instructions and care plan provided today and agrees to view in MyChart. Active MyChart status and patient understanding of how to access instructions and care plan via MyChart confirmed with patient.     The patient has been provided with contact information for the care management team and has been advised to call with any health related questions or concerns.   Bary Leriche, RN,  MSN [facility], [facility] Management Community Coordinator Direct Dial: [phone]  Fax: [phone] Website: [email or web address]

## 2022-11-25 NOTE — Patient Outreach (Signed)
  Care Coordination   Follow Up Visit Note   11/25/2022 Name: Holly Acosta MRN: [id number] DOB: Jul 30, 1955  Holly Acosta is a 66 y.o. year old female who sees Everhart, Kirstie, DO for primary care. I spoke with  Holly Acosta by phone today.  What matters to the patients health and wellness today?  Diabetes management    Goals Addressed             This Visit's Progress    Diabetes Management       Care Coordination Interventions: Provided education to patient about basic DM disease process Reviewed medications with patient and discussed importance of medication adherence Discussed plans with patient for ongoing care management follow up and provided patient with direct contact information for care management team  Spoke with patient. She reports she is about the same.  Blood sugar management continues to be a challenge.  Reiterated importance of low carb diet and exercise being key to management.  She verbalized understanding.  She reports she has her colonoscopy scheduled for this month.  Encouraged getting the flu vaccine. Patient plans to get it later this month.  Patient asked about resources for hot water heater as hers went out.  She states she has tried resources given to no avail.  Discussed [employer] with her.  Information given to patient to call to inquire.          SDOH assessments and interventions completed:  Yes     Care Coordination Interventions:  Yes, provided   Follow up plan: Follow up call scheduled for January    Encounter Outcome:  Patient Visit Completed   Bary Leriche, RN, MSN [place]  [facility], [facility] Management Community Coordinator Direct Dial: [phone]  Fax: [phone] Website: [email or web address]

## 2022-11-27 NOTE — Progress Notes (Signed)
PATIENT: Holly Acosta DOB: [birth year]  REASON FOR VISIT: follow up HISTORY FROM: patient  Chief Complaint  Patient presents with   Room 2    Pt is here Alone. Pt states that her machine says "error" and then shut it off. Pt states that she took her machine to DME and they stated that she needed to see her doctor for a [employer].      HISTORY OF PRESENT ILLNESS:  12/03/22 Holly Acosta returns for follow up for OSA on BiPAP. She was last seen 11/2021. Compliance had improved but she was having trouble with increased air leak. Since, she reports doing well. She has an older machine and is receiving an error message on her machine. She has not used her machine for the past week due to this message. She reports that her machine will turn off spontaneously. She is eligible for a new machine in 2020.     12/04/2021 Holly Acosta returns for follow up for OSA on BiPAP. She was last seen 05/2021. Compliance was suboptimal and she was having difficulty with an air leak with FFM. Since, she reports doing much better with compliance. She is using BiPAP most every night for about 5 hours. She continues to struggle with an air leak. She is using FFM and feels it is the best fit for her. She feels that she can tighten her mask up at home.      05/23/2021 ALL:  Holly Acosta returns for follow up for OSA on BiPAP. We last saw her 01/2020 and she had resumed therapy. Four hour compliance was sub optimal. Repeat download in 04/2020 showed improvement and she was encouraged to continue working toward compliance goals. She has been lost to follow up. She reports that she has continued to use CPAP regularly. She did have a break in usage for a couple of weeks in March following cataract surgery but has resumed therapy and doing well. She has used every night for the past 3.5 weeks. She does note a leak in her FFM. She feels if she tightens mask, it hurts bridge of nose.     02/01/2020 ALL:  She is doing well. She has  resumed BiPAP therapy and is doing well. She denies difficulty with her machine or supplies. She has not heard anything regarding recall. She does note some air leaking from her mask but may need a replacement.   Compliance report dated 01/01/2020 through 01/30/2020 reveals that she used CPAP 29 of the past 30 days for compliance of 96.7%.  She used CPAP greater than 4 hours 66.7% of the time.  Average usage on days used was 4 hours and 21 minutes.  Residual AHI was 5.7 with a EPAP of 18 and IPAP of 21.  Average time in large leak per day was 1 hour and 39 minutes.  History (copied from previous notes)  10/26/2019 Holly Acosta is a 67 y.o. female here today for follow up for OSA on BiPAP. She was admitted to the hospital 01/2019 for PNA in setting of Covid.  She reports that she is recovered nicely.  She continues to follow-up very closely with primary care.  Diabetes has not been well controlled recently but she is working closely with her providers.  She has not used BiPAP consistently.  She has been concerned about the recall.  She thinks that someone told her her machine was not effective.  She does not use an external cleanser.  She does not expose  BiPAP machine to excessive heat.  She is motivated to continue using BiPAP as she feels much less tired when she is using it consistently.  Compliance report dated 09/12/2019 through 10/11/2019 reveals that she used BiPAP therapy six of the past 30 days for compliance of 20%.  She used CPAP greater than 4 hours three of the past 30 days for compliance of 10%.  Residual AHI was 2.9 with a EPAP of 18 cm of water and IPAP of 21 cm of water.  There was no leak noted.  HISTORY: (copied from my note on 02/10/2019)  Holly negativeMary H Acosta is a 67 y.o. female here today for follow up for OSA on BiPAP.  Four hour usage remains sub optimal. She continues to work on meeting 4 hour goal. She admits that sometimes she falls asleep without placing BiPAP and sometimes she  pulls her mask off during the night. Otherwise, she is doing very well. She notes benefit of BiPAP therapy.    Compliance report dated 01/10/2019 through 02/08/2019 reveals that she used BiPAP 28 of the last 30 days compliance of 93%.  She used BiPAP greater than 4 hours 57% of the time.  Average usage was 3 hours and 59 minutes.  Residual AHI was 5.4 with IPAP of 21 cm of water and EPAP of 18 cm of water. No significant leak noted.    HISTORY: (copied from my note on 08/09/2018)   Holly Acosta is a 67 y.o. female here today for follow up of OSA on BiPAP.  She is doing well with therapy.  She is using her machine every night.  She reports that she continues to struggle with using it for more than 4 hours each night.  She does feel that this is improving with time.  She denies any concerns with mask.  Compliance download dated 07/06/2018 through 08/04/2018 reveals that she is using her machine 29 of the last 30 days.  On average she used her machine 3 hours and 34 minutes.  AHI was 6.1 with EPAP of 18 cm of water and IPAP of 21 cm of water.  There was no significant leak.  She does note significant improvement in her fatigue.   History (copied from my note on 04/29/2018)   Holly Acosta is a 67 y.o. female for follow up of OSA on BiPAP. She she reports that she is doing well with her BiPAP machine.  She definitely feels that it helps with sleep and her energy the next day.  Download report reveals that she is used her machine 27 out of the last 30 days for compliance of 90%.  63% compliance with use greater than 4 hours.  She averages about 4 hours and 53 minutes.  AHI is 8.1 with EPAP at 18 cm of water and IPAP at 21 cm of water.   History (copied from Dr Dohmeier's note on 12/03/2017)   HPI:  Holly Acosta is a 67 y.o. female patient and seen  on 12-03-2017 upon referral for a sleep evaluation.  Holly Acosta is a [facility] patient with the resident clinic, she was admitted to [facility] on 27 April 2017 for a cardiac infarct status post stent placement and she has been using aspirin and Brilinta.  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  Half function at the time was an ejection fraction of 55 to 60% with multi regional hypokinesis.  The patient has known diabetes which has been poorly controlled at the time her HbA1c was 13.3.  Additional risk factors for stroke and heart disease are hyperlipidemia, obesity and untreated obstructive sleep apnea.  The patient was seen by Dr. Cheryl Flash and then by Dr. Pearlean Brownie on 06 October 2017 HbA1c was now 9.3, still very elevated but improved.  She has a little bit of bruising and bleeding from Brilinta in combination with aspirin.  She is using a walker rehab had been delayed due to transportation problems.  She takes Crestor and her blood pressure has been controlled.    Her previous sleep study 9/20/ 2011 was ordered through Drs. Mulberry and MacPherson, it revealed severe OSA at an AHI of 48/h. CPAP therapy was recommnended. and sleep evaluations were obtained through the [facility] lab. The machine is 67 years old and now  broken. She can't remember who followed her in the [facility] sleep medicine clinic. The study was read by Dr. Jetty Duhamel.      Chief complaint according to patient :" I may have to go back on CPAP - but I sleep fairly good" and " I easily fall asleep when I am inactive and not stimulated".   Sleep habits are as follows:Donner time is around 7 Pm, and she goes to bed most nights between 10 and 11 PM. Has a seperate bedroom from her living room, and retreats at that time. The bedroom is kept cool, quiet and dark. She sleeps on her back , some times on her left  side. She needs 2 pillows and likes the head of bed elevated, sleeps with a fan in the background. She is not dreaming much, she stated.  Reports only one bathroom break around 4-5 AM. Wakes sponatanously between 6 and 7 AM , and she estimates 7-8 hours of sleep each night.  She wakes up refreshed. No headaches in AM- but sometimes with a dry mouth.    She naps in daytime, unscheduled. She lives with 2 granddaughters, who reported her snoring loudly and witnessed her having sleep apnea.      Medical history : already in record, reviewed.    Family history : Sister is only family member with sleep apnea.     Social history: her 2 granddaughters live at her house , she is divorced. Disability due to knee osteoarthritis, she became disabled 2015 and has yet not received hip or knee surgery . She quit smoking over 20 years ago, but one granddaughter smokes in the house. ETOH- none, caffeine - she drinks coffee in AM , iced tea for lunch and dinner    REVIEW OF SYSTEMS: Out of a complete 14 system review of symptoms, the patient complains only of the following symptoms, fatigue, imbalance, and all other reviewed systems are negative.  ESS: 13/24, previously 12 FSS: 23/63   ALLERGIES: Allergies  Allergen Reactions   Lipitor [Atorvastatin] Other (See Comments)    Cramping in feet that resolved after stopping med   Diclofenac Nausea Only and Other (See Comments)    Caused a lot of stomach pain     HOME MEDICATIONS: Outpatient Medications Prior to Visit  Medication Sig Dispense Refill   acetaminophen (TYLENOL) 500 MG tablet Take 1,000 mg by mouth daily as needed for mild pain (  pain score 1-3).     amLODipine (NORVASC) 5 MG tablet TAKE 1 TABLET BY MOUTH EVERY DAY 90 tablet 3   aspirin EC 81 MG tablet Take 1 tablet (81 mg total) by mouth daily. 90 tablet 3   carvedilol (COREG) 12.5 MG tablet Take 1 tablet (12.5 mg total) by mouth 2 (two) times daily with a meal. 180 tablet 3   Continuous  Glucose Sensor (FREESTYLE LIBRE 3 SENSOR) MISC Place 1 sensor on the skin every 14 days. Use to check glucose continuously 2 each 11   [employer] VITAMIN B12 1000 MCG tablet TAKE 1 TABLET BY MOUTH EVERY DAY 90 tablet 1   dapagliflozin propanediol (FARXIGA) 10 MG TABS tablet TAKE 1 TABLET BY MOUTH EVERY DAY 90 tablet 1   Evolocumab (REPATHA SURECLICK) 140 MG/ML SOAJ INJECT 140 MG INTO THE SKIN EVERY 14 (FOURTEEN) DAYS. 6 mL 1   ezetimibe (ZETIA) 10 MG tablet Take 1 tablet (10 mg total) by mouth daily. 90 tablet 3   ferrous sulfate 325 (65 FE) MG tablet TAKE 1 TABLET BY MOUTH TWICE A DAY WITH FOOD 180 tablet 0   insulin glargine (LANTUS SOLOSTAR) 100 UNIT/ML Solostar Pen INJECT 36 UNITS INTO SKIN DAILY 15 mL 11   insulin lispro (HUMALOG KWIKPEN) 100 UNIT/ML KwikPen Inject 14-15 Units into the skin See admin instructions. Inject 14-15 units 2-3 times a day 3 mL 3   lisinopril (ZESTRIL) 10 MG tablet TAKE 1 TABLET BY MOUTH EVERY DAY 90 tablet 3   metFORMIN (GLUCOPHAGE) 1000 MG tablet TAKE 1 TABLET (1,000 MG TOTAL) BY MOUTH TWICE A DAY WITH FOOD 180 tablet 3   Omega-3 Fatty Acids (FISH OIL) 1000 MG CAPS Take 1,000 mg by mouth daily.     SEMGLEE, YFGN, 100 UNIT/ML Pen Inject into the skin daily.     senna-docusate (SENOKOT-S) 8.6-50 MG tablet Take 1 tablet by mouth at bedtime as needed for mild constipation.     Accu-Chek FastClix Lancets MISC 1 each by Does not apply route daily. Check sugar daily as needed (Patient not taking: Reported on 12/03/2022) 102 each 3   B-D UF III MINI PEN NEEDLES 31G X 5 MM MISC USE AS DIRECTED 3 TIMES A DAY (Patient not taking: Reported on 12/03/2022) 100 each 2   Blood Glucose Monitoring Suppl (ACCU-CHEK GUIDE ME) w/Device KIT PLEASE USE TO CHECK BLOOD SUGAR UP TO THREE TIMES DAILY. (Patient not taking: Reported on 12/03/2022) 1 kit 1   glucose blood (ACCU-CHEK GUIDE) test strip Please use to check blood sugar up to three times daily. (Patient not taking: Reported on 12/03/2022) 300  strip 11   guaiFENesin-dextromethorphan (ROBITUSSIN DM) 100-10 MG/5ML syrup Take 5 mLs by mouth every 4 (four) hours as needed for cough. (Patient not taking: Reported on 09/23/2022) 118 mL 0   No facility-administered medications prior to visit.    PAST MEDICAL HISTORY: Past Medical History:  Diagnosis Date   Acute cerebrovascular accident (CVA) of cerebellum (HCC) 04/29/2017   Acute cerebrovascular accident (CVA) of cerebellum (HCC) 04/29/2017   Acute respiratory failure with hypoxia (HCC)    AKI (acute kidney injury) (HCC)    Anemia    Arthritis of knee    "both knees" (04/24/2017)   Cataract    Cerebrovascular accident (CVA) due to occlusion of left posterior cerebral artery (HCC) 12/03/2017   Chest pain 04/24/2017   COLONIC POLYPS, HYPERPLASTIC 09/13/2009   Qualifier: Diagnosis of  By: Delrae Alfred MD, Elizabeth     Constipation 06/02/2017  Coronary artery disease involving native coronary artery of native heart with unstable angina pectoris (HCC) 04/30/2017   Dehydration    Diabetes mellitus, type 2 (HCC)    History of blood transfusion    "related to prolapsed uterus"   HLD (hyperlipidemia)    HTN (hypertension)    Non-ST elevation (NSTEMI) myocardial infarction [facility])    Non-ST elevation (NSTEMI) myocardial infarction (HCC)    Pneumonia due to COVID-19 virus 02/18/2019   Prolapsed uterus    Severe obstructive sleep apnea-hypopnea syndrome 01/01/2018   Sleep apnea    "have mask; haven't worn it for a long time" (04/24/2017)   Status post coronary artery stent placement    Umbilical hernia     PAST SURGICAL HISTORY: Past Surgical History:  Procedure Laterality Date   CARDIAC CATHETERIZATION  2011   COLONOSCOPY     CORONARY STENT INTERVENTION N/A 04/27/2017   Procedure: CORONARY STENT INTERVENTION;  Surgeon: Corky Crafts, MD;  Location: [facility] INVASIVE CV LAB;  Service: Cardiovascular;  Laterality: N/A;   LEFT HEART CATH AND CORONARY ANGIOGRAPHY N/A 04/27/2017    Procedure: LEFT HEART CATH AND CORONARY ANGIOGRAPHY;  Surgeon: Corky Crafts, MD;  Location: [facility] INVASIVE CV LAB;  Service: Cardiovascular;  Laterality: N/A;   TUBAL LIGATION      FAMILY HISTORY: Family History  Problem Relation Age of Onset   Diabetes Mother    Hypertension Mother    Hypertension Father    Colon cancer Neg Hx    Colon polyps Neg Hx    Esophageal cancer Neg Hx    Stomach cancer Neg Hx    Rectal cancer Neg Hx     SOCIAL HISTORY: Social History   Socioeconomic History   Marital status: Divorced    Spouse name: Not on file   Number of children: Not on file   Years of education: Not on file   Highest education level: Not on file  Occupational History   Not on file  Tobacco Use   Smoking status: Former    Current packs/day: 0.00    Average packs/day: 1 pack/day for 33.8 years (33.8 ttl pk-yrs)    Types: Cigarettes    Start date: 01/20/1973    Quit date: 11/04/2006    Years since quitting: 16.0   Smokeless tobacco: Never  Vaping Use   Vaping status: Never Used  Substance and Sexual Activity   Alcohol use: Never    Alcohol/week: 0.0 standard drinks of alcohol   Drug use: Never   Sexual activity: Not Currently  Other Topics Concern   Not on file  Social History Narrative   Lives with daughter Toniann Fail age 31) and 4 grandchildren.   Disability - Arthritis. Does get disability check.   Does not have a living will yet   Social Determinants of Health   Financial Resource Strain: Low Risk  (07/25/2022)   Overall Financial Resource Strain (CARDIA)    Difficulty of Paying Living Expenses: Not hard at all  Food Insecurity: No Food Insecurity (09/23/2022)   Hunger Vital Sign    Worried About Running Out of Food in the Last Year: Never true    Ran Out of Food in the Last Year: Never true  Transportation Needs: No Transportation Needs (09/23/2022)   PRAPARE - Administrator, Civil Service (Medical): No    Lack of Transportation (Non-Medical): No   Physical Activity: Inactive (07/25/2022)   Exercise Vital Sign    Days of Exercise per Week: 0 days  Minutes of Exercise per Session: 0 min  Stress: No Stress Concern Present (07/25/2022)   Holly Acosta    Feeling of Stress : Not at all  Social Connections: Moderately Isolated (07/25/2022)   Social Connection and Isolation Panel [NHANES]    Frequency of Communication with Friends and Family: More than three times a week    Frequency of Social Gatherings with Friends and Family: Three times a week    Attends Religious Services: More than 4 times per year    Active Member of Clubs or Organizations: No    Attends Banker Meetings: Never    Marital Status: Divorced  Catering manager Violence: Not At Risk (07/25/2022)   Humiliation, Afraid, Rape, and Kick Acosta    Fear of Current or Ex-Partner: No    Emotionally Abused: No    Physically Abused: No    Sexually Abused: No      PHYSICAL EXAM  Vitals:   12/03/22 1024  BP: (!) 160/86  Pulse: 64  Weight: 171 lb (77.6 kg)  Height: 5\' 4"  (1.626 m)   Neck circ: 14" Mallampati 3, dentures on top   Body mass index is 29.35 kg/m.  Generalized: Well developed, in no acute distress  Cardiology: normal rate and rhythm, no murmur noted Respiratory: clear to auscultation bilaterally  Neurological examination  Mentation: Alert oriented to time, place, history taking. Follows all commands speech and language fluent Cranial nerve II-XII: Pupils were equal round reactive to light. Extraocular movements were full, visual field were full  Motor: The motor testing reveals 5 over 5 strength of all 4 extremities. Good symmetric motor tone is noted throughout.  Gait and station: Gait is stable with Rolator   DIAGNOSTIC DATA (LABS, IMAGING, TESTING) - I reviewed patient records, labs, notes, testing and imaging myself where available.      No data to display            Lab Results  Component Value Date   WBC 8.4 01/21/2022   HGB 10.5 (L) 01/21/2022   HCT 34.2 (L) 01/21/2022   MCV 84.9 01/21/2022   PLT 335 01/21/2022      Component Value Date/Time   NA 142 02/13/2022 1445   K 4.7 02/13/2022 1445   CL 102 02/13/2022 1445   CO2 19 (L) 02/13/2022 1445   GLUCOSE 97 02/13/2022 1445   GLUCOSE 129 (H) 01/21/2022 0356   BUN 16 02/13/2022 1445   CREATININE 1.09 (H) 02/13/2022 1445   CREATININE 0.93 02/01/2015 1640   CALCIUM 10.1 02/13/2022 1445   PROT 6.9 08/14/2022 1141   ALBUMIN 4.1 08/14/2022 1141   AST 17 08/14/2022 1141   ALT 15 08/14/2022 1141   ALKPHOS 86 08/14/2022 1141   BILITOT <0.2 08/14/2022 1141   GFRNONAA >60 01/21/2022 0356   GFRNONAA 67 02/01/2015 1640   GFRAA 61 03/06/2020 0915   GFRAA 78 02/01/2015 1640   Lab Results  Component Value Date   CHOL 206 (H) 08/14/2022   HDL 33 (L) 08/14/2022   LDLCALC 104 (H) 08/14/2022   LDLDIRECT 135 (H) 12/31/2012   TRIG 404 (H) 08/14/2022   CHOLHDL 6.2 (H) 08/14/2022   Lab Results  Component Value Date   HGBA1C 10.2 (A) 09/04/2022   Lab Results  Component Value Date   VITAMINB12 131 (L) 01/19/2022   Lab Results  Component Value Date   TSH 0.422 04/28/2017       ASSESSMENT AND PLAN 67 y.o. year  old female  has a past medical history of Acute cerebrovascular accident (CVA) of cerebellum (HCC) (04/29/2017), Acute cerebrovascular accident (CVA) of cerebellum (HCC) (04/29/2017), Acute respiratory failure with hypoxia (HCC), AKI (acute kidney injury) (HCC), Anemia, Arthritis of knee, Cataract, Cerebrovascular accident (CVA) due to occlusion of left posterior cerebral artery (HCC) (12/03/2017), Chest pain (04/24/2017), COLONIC POLYPS, HYPERPLASTIC (09/13/2009), Constipation (06/02/2017), Coronary artery disease involving native coronary artery of native heart with unstable angina pectoris (HCC) (04/30/2017), Dehydration, Diabetes mellitus, type 2 (HCC), History of blood  transfusion, HLD (hyperlipidemia), HTN (hypertension), Non-ST elevation (NSTEMI) myocardial infarction [facility]), Non-ST elevation (NSTEMI) myocardial infarction (HCC), Pneumonia due to COVID-19 virus (02/18/2019), Prolapsed uterus, Severe obstructive sleep apnea-hypopnea syndrome (01/01/2018), Sleep apnea, Status post coronary artery stent placement, and Umbilical hernia. here with     ICD-10-CM   1. OSA treated with BiPAP  G47.33 Home sleep test      Hartlyn reports that she is doing well today.  She continues BiPAP therapy and reports that she is feeling better.  Unfortunately, her machine is giving her an error message. We will order HST and plan to order new machine pending results. There is a large air leak. I have encouraged her to focus on correct mask seal at home. She may reach out to DME for mask refit if needed.  She was encouraged to use BiPAP nightly and for greater than 4 hours each night. She will continue follow-up with primary care as directed.  Healthy lifestyle habits encouraged.  I will have her follow-up in 61-90 days following set up of new BiPAP.  She verbalizes understanding and agreement with this plan.   Orders Placed This Encounter  Procedures   Home sleep test    Currently on BiPAP, needs mew machine, current machine not working    Standing Status:   Future    Standing Expiration Date:   12/03/2023    Order Specific Question:   Where should this test be performed:    Answer:   [facility]     No orders of the defined types were placed in this encounter.       Shawnie Dapper, FNP-C 12/03/2022, 10:56 AM [facility] [address] [place], [place] [address] [phone]

## 2022-11-27 NOTE — Patient Instructions (Addendum)
Please continue using your BiPAP regularly. While your insurance requires that you use BiPAP at least 4 hours each night on 70% of the nights, I recommend, that you not skip any nights and use it throughout the night if you can. Getting used to BiPAP and staying with the treatment long term does take time and patience and discipline. Untreated obstructive sleep apnea when it is moderate to severe can have an adverse impact on cardiovascular health and raise her risk for heart disease, arrhythmias, hypertension, congestive heart failure, stroke and diabetes. Untreated obstructive sleep apnea causes sleep disruption, nonrestorative sleep, and sleep deprivation. This can have an impact on your day to day functioning and cause daytime sleepiness and impairment of cognitive function, memory loss, mood disturbance, and problems focussing. Using BiPAP regularly can improve these symptoms.  We will update supply orders, today. You are eligible for a new machine. I will order a repeat sleep study that you will do at home. Please listen out for a call from our sleep lab staff to schedule. Once you complete study, our sleep doctors with evaluate the data and we will use that to order a new machine as indicated. This process could take a few weeks. Once new machine is ordered, you will hear back from your DME company to schedule set up of your new machine.   We will need to see you back within 61-90 days following set up of your new BiPAP to document compliance as required by your insurance company. Please call the office to schedule follow up when you receive your new machine. Please feel free to reach out with any questions or concerns.

## 2022-12-02 ENCOUNTER — Other Ambulatory Visit: Payer: Self-pay | Admitting: General Practice

## 2022-12-02 ENCOUNTER — Encounter: Payer: Self-pay | Admitting: Certified Registered Nurse Anesthetist

## 2022-12-02 DIAGNOSIS — E1169 Type 2 diabetes mellitus with other specified complication: Secondary | ICD-10-CM

## 2022-12-02 DIAGNOSIS — I25110 Atherosclerotic heart disease of native coronary artery with unstable angina pectoris: Secondary | ICD-10-CM

## 2022-12-03 ENCOUNTER — Other Ambulatory Visit: Payer: Self-pay

## 2022-12-03 ENCOUNTER — Ambulatory Visit: Payer: 59 | Admitting: Family Medicine

## 2022-12-03 ENCOUNTER — Encounter: Payer: Self-pay | Admitting: Family Medicine

## 2022-12-03 ENCOUNTER — Encounter: Payer: 59 | Admitting: Gastroenterology

## 2022-12-03 ENCOUNTER — Telehealth: Payer: Self-pay | Admitting: Gastroenterology

## 2022-12-03 VITALS — BP 160/86 | HR 64 | Ht 64.0 in | Wt 171.0 lb

## 2022-12-03 DIAGNOSIS — G4733 Obstructive sleep apnea (adult) (pediatric): Secondary | ICD-10-CM | POA: Diagnosis not present

## 2022-12-03 DIAGNOSIS — E1165 Type 2 diabetes mellitus with hyperglycemia: Secondary | ICD-10-CM

## 2022-12-03 MED ORDER — ACCU-CHEK GUIDE VI STRP
ORAL_STRIP | 11 refills | Status: DC
Start: 2022-12-03 — End: 2023-03-09

## 2022-12-03 NOTE — Telephone Encounter (Signed)
Patient called to follow up on message below, requesting a call to discuss. Patient is currently scheduled for procedure this afternoon at 4:00 with Dr. Lavon Paganini. Please advise.

## 2022-12-03 NOTE — Telephone Encounter (Signed)
Spoke with pt who states she only drank 4 cups of the Golytely prep yesterday and only 5 cups today. States she has only had a few BMs and that they are still brown with consistency, not clear/yellow water that is needed for colonoscopy. Pt reports that she does have problems with constipation r/t medications she takes. She did not take the daily Miralax for the 5 days before the procedure that she was instructed to do. Pt states she got confused with the instructions. She had a previsit back in August and had to reschedule the colonoscopy. Rescheduled pt with a previsit on 12/24/22 and colonoscopy on 01/02/23.

## 2022-12-03 NOTE — Telephone Encounter (Signed)
Patient called regarding her prep drink and stated she has drank 5 cups already and if she should continue until 8 or can she stop. Please advise.

## 2022-12-03 NOTE — Telephone Encounter (Signed)
Ok, thanks.

## 2022-12-14 DIAGNOSIS — G4733 Obstructive sleep apnea (adult) (pediatric): Secondary | ICD-10-CM | POA: Diagnosis not present

## 2022-12-14 DIAGNOSIS — G4730 Sleep apnea, unspecified: Secondary | ICD-10-CM | POA: Diagnosis not present

## 2022-12-22 ENCOUNTER — Other Ambulatory Visit: Payer: Self-pay

## 2022-12-22 DIAGNOSIS — E538 Deficiency of other specified B group vitamins: Secondary | ICD-10-CM

## 2022-12-22 MED ORDER — CYANOCOBALAMIN 1000 MCG PO TABS
1000.0000 ug | ORAL_TABLET | Freq: Every day | ORAL | 1 refills | Status: DC
Start: 2022-12-22 — End: 2023-08-04

## 2022-12-24 ENCOUNTER — Telehealth: Payer: Self-pay | Admitting: *Deleted

## 2022-12-24 NOTE — Telephone Encounter (Signed)
Pt stated she could not talk at this time. Gave Call back #  and instructed her to reschedule PV with RN by 5 pm today (12/24/22) or her procedure will be canceled. Pt stated she understood.

## 2022-12-25 ENCOUNTER — Encounter: Payer: Self-pay | Admitting: Gastroenterology

## 2023-01-02 ENCOUNTER — Encounter: Payer: 59 | Admitting: Gastroenterology

## 2023-01-12 ENCOUNTER — Other Ambulatory Visit (HOSPITAL_COMMUNITY): Payer: Self-pay

## 2023-01-12 DIAGNOSIS — G4733 Obstructive sleep apnea (adult) (pediatric): Secondary | ICD-10-CM | POA: Diagnosis not present

## 2023-01-27 ENCOUNTER — Ambulatory Visit: Payer: Self-pay

## 2023-01-27 NOTE — Patient Outreach (Signed)
  Care Coordination   01/27/2023 Name: MATRICE HERRO MRN: [id number] DOB: August 09, 1955   Care Coordination Outreach Attempts:  An unsuccessful outreach was attempted for an appointment today.  Follow Up Plan:  Additional outreach attempts will be made to offer the patient complex care management information and services.   Encounter Outcome:  No Answer   Care Coordination Interventions:  No, not indicated    Lavaun JINNY Seeds, RN, MSN RN Care Manager [facility], Population Health Direct Dial : [phone]  Fax: [phone] Website: [email or web address]

## 2023-02-02 ENCOUNTER — Other Ambulatory Visit: Payer: Self-pay

## 2023-02-02 DIAGNOSIS — E1165 Type 2 diabetes mellitus with hyperglycemia: Secondary | ICD-10-CM

## 2023-02-02 MED ORDER — DAPAGLIFLOZIN PROPANEDIOL 10 MG PO TABS
10.0000 mg | ORAL_TABLET | Freq: Every day | ORAL | 1 refills | Status: DC
Start: 2023-02-02 — End: 2023-03-20

## 2023-02-04 ENCOUNTER — Telehealth: Payer: Self-pay

## 2023-02-04 NOTE — Patient Outreach (Signed)
  Care Coordination   Follow Up Visit Note   02/04/2023 Name: Holly Acosta MRN: [id number] DOB: August 10, 1955  Holly Acosta is a 68 y.o. year old female who sees Everhart, Kirstie, DO for primary care. I spoke with  Holly Acosta by phone today.  What matters to the patients health and wellness today?  Maintaining diabetes    Goals Addressed             This Visit's Progress    Diabetes Management       Care Coordination Interventions: Provided education to patient about basic DM disease process Reviewed medications with patient and discussed importance of medication adherence Discussed plans with patient for ongoing care management follow up and provided patient with direct contact information for care management team  Patient reports she is doing okay. No specific health complaints.  She continues to try to manage diabetes.  She reports however that her sugars are running 175-225.  Discussed her diet and how and what she eats.  Stressed importance of better sugar control. She verbalized understanding.  Advised diet and exercise as methods to help control diabetes.  Patient noted to have not seen PCP since August, discussed making an appointment.  Patient states she will call today.  Declines needing assistance.          SDOH assessments and interventions completed:  Yes  SDOH Interventions Today    Flowsheet Row Most Recent Value  SDOH Interventions   Food Insecurity Interventions Intervention Not Indicated  Housing Interventions Intervention Not Indicated  Transportation Interventions Intervention Not Indicated  Utilities Interventions Intervention Not Indicated  Health Literacy Interventions Intervention Not Indicated        Care Coordination Interventions:  Yes, provided   Follow up plan: Follow up call scheduled for February    Encounter Outcome:  Patient Visit Completed   Severino Paolo J Maryum Batterson, RN, MSN [place]  [facility], [facility] RN  Care Manager Direct Dial : [phone]  Fax: [phone] Website: [email or web address]

## 2023-02-04 NOTE — Patient Instructions (Signed)
 Visit Information  Thank you for taking time to visit with me today. Please don't hesitate to contact me if I can be of assistance to you.   Following are the goals we discussed today:   Goals Addressed             This Visit's Progress    Diabetes Management       Care Coordination Interventions: Provided education to patient about basic DM disease process Reviewed medications with patient and discussed importance of medication adherence Discussed plans with patient for ongoing care management follow up and provided patient with direct contact information for care management team  Patient reports she is doing okay. No specific health complaints.  She continues to try to manage diabetes.  She reports however that her sugars are running 175-225.  Discussed her diet and how and what she eats.  Stressed importance of better sugar control. She verbalized understanding.  Advised diet and exercise as methods to help control diabetes.  Patient noted to have not seen PCP since August, discussed making an appointment.  Patient states she will call today.  Declines needing assistance.          Our next appointment is by telephone on 03/10/23 at 9:30 am  Please call the care guide team at [phone] if you need to cancel or reschedule your appointment.   If you are experiencing a Mental Health or Behavioral Health Crisis or need someone to talk to, please call the Suicide and Crisis Lifeline: 988   The patient verbalized understanding of instructions, educational materials, and care plan provided today and DECLINED offer to receive copy of patient instructions, educational materials, and care plan.   The patient has been provided with contact information for the care management team and has been advised to call with any health related questions or concerns.   Anupama Piehl J Elizabth Palka, RN, MSN [facility], [facility] RN Care Manager Direct Dial : [phone]  Fax:  [phone] Website: [email or web address]

## 2023-02-11 ENCOUNTER — Ambulatory Visit: Payer: 59

## 2023-02-11 VITALS — Ht 64.0 in | Wt 171.0 lb

## 2023-02-11 DIAGNOSIS — Z1211 Encounter for screening for malignant neoplasm of colon: Secondary | ICD-10-CM

## 2023-02-11 MED ORDER — SUFLAVE 178.7 G PO SOLR
1.0000 | ORAL | 0 refills | Status: DC
Start: 2023-02-11 — End: 2023-04-13

## 2023-02-11 NOTE — Progress Notes (Deleted)
Cardiology Office Note   Date:  02/11/2023   ID:  Holly, Acosta November 25, 1955, MRN [id number]  PCP:  Para March, DO   Cardiologist: Dr. [place]  No chief complaint on file.    History of Present Illness: Holly Acosta is a 68 y.o. female who presents for ongoing assessment and management of coronary artery disease. She has a history of DM, HTN, HLD, and OSA. Cardiac cath in 2008 showed no significant CAD. She was admitted with a NSTEMI in April 2019.  Troponin > 65. Cardiac catheterization showed severe stenosis of the left circumflex requiring drug-eluting stent, currently on dual antiplatelet therapy with Brilinta and aspirin.  Her hospital course was complication by an acute ischemic left cerebral infarct without frank hemorrhagic transformation and acute ischemic nonhemorrhagic right cerebellar infarct. The patient was transferred to skilled nursing facility for rehab.    She was admitted in January 2021 with Covid 19 PNA with hypoxic respiratory failure. Treated with decadron and remdesivir. As a result of this sugars have been poorly controlled with A1c 13.6%. She was tried on Victoza but couldn't tolerate it due to N/V.   She has been seen by our Pharm D for lipid management. History of statin intolerance. Recommended starting Repatha in July  On follow up today she reports she is doing well. Denies any chest pain or SOB. No palpitations. No TIA or CVA symptoms. Walks with a walker. Notes sugars have been better since insulin adjusted. Now on high dose Crestor, Zetia and fish oil for hyperlipidemia.   Past Medical History:  Diagnosis Date   Acute cerebrovascular accident (CVA) of cerebellum (HCC) 04/29/2017   Acute cerebrovascular accident (CVA) of cerebellum (HCC) 04/29/2017   Acute respiratory failure with hypoxia (HCC)    AKI (acute kidney injury) (HCC)    Anemia    Arthritis of knee    "both knees" (04/24/2017)   Cataract    Cerebrovascular accident (CVA) due to  occlusion of left posterior cerebral artery (HCC) 12/03/2017   Chest pain 04/24/2017   COLONIC POLYPS, HYPERPLASTIC 09/13/2009   Qualifier: Diagnosis of  By: Delrae Alfred MD, Elizabeth     Constipation 06/02/2017   Coronary artery disease involving native coronary artery of native heart with unstable angina pectoris (HCC) 04/30/2017   Dehydration    Diabetes mellitus, type 2 (HCC)    History of blood transfusion    "related to prolapsed uterus"   HLD (hyperlipidemia)    HTN (hypertension)    Non-ST elevation (NSTEMI) myocardial infarction (HCC)    Non-ST elevation (NSTEMI) myocardial infarction (HCC)    Pneumonia due to COVID-19 virus 02/18/2019   Prolapsed uterus    Severe obstructive sleep apnea-hypopnea syndrome 01/01/2018   Sleep apnea    "have mask; haven't worn it for a long time" (04/24/2017)   Status post coronary artery stent placement    Umbilical hernia     Past Surgical History:  Procedure Laterality Date   CARDIAC CATHETERIZATION  2011   COLONOSCOPY     CORONARY STENT INTERVENTION N/A 04/27/2017   Procedure: CORONARY STENT INTERVENTION;  Surgeon: Corky Crafts, MD;  Location: [facility] INVASIVE CV LAB;  Service: Cardiovascular;  Laterality: N/A;   LEFT HEART CATH AND CORONARY ANGIOGRAPHY N/A 04/27/2017   Procedure: LEFT HEART CATH AND CORONARY ANGIOGRAPHY;  Surgeon: Corky Crafts, MD;  Location: [facility] INVASIVE CV LAB;  Service: Cardiovascular;  Laterality: N/A;   TUBAL LIGATION       Current Outpatient Medications  Medication Sig Dispense  Refill   Accu-Chek FastClix Lancets MISC 1 each by Does not apply route daily. Check sugar daily as needed (Patient not taking: Reported on 12/03/2022) 102 each 3   acetaminophen (TYLENOL) 500 MG tablet Take 1,000 mg by mouth daily as needed for mild pain (pain score 1-3).     amLODipine (NORVASC) 5 MG tablet TAKE 1 TABLET BY MOUTH EVERY DAY 90 tablet 3   aspirin EC 81 MG tablet Take 1 tablet (81 mg total) by mouth daily. 90 tablet  3   B-D UF III MINI PEN NEEDLES 31G X 5 MM MISC USE AS DIRECTED 3 TIMES A DAY (Patient not taking: Reported on 12/03/2022) 100 each 2   Blood Glucose Monitoring Suppl (ACCU-CHEK GUIDE ME) w/Device KIT PLEASE USE TO CHECK BLOOD SUGAR UP TO THREE TIMES DAILY. (Patient not taking: Reported on 12/03/2022) 1 kit 1   carvedilol (COREG) 12.5 MG tablet Take 1 tablet (12.5 mg total) by mouth 2 (two) times daily with a meal. 180 tablet 3   Continuous Glucose Sensor (FREESTYLE LIBRE 3 SENSOR) MISC Place 1 sensor on the skin every 14 days. Use to check glucose continuously 2 each 11   cyanocobalamin ([facility] VITAMIN B12) 1000 MCG tablet Take 1 tablet (1,000 mcg total) by mouth daily. 90 tablet 1   dapagliflozin propanediol (FARXIGA) 10 MG TABS tablet Take 1 tablet (10 mg total) by mouth daily. 90 tablet 1   Evolocumab (REPATHA SURECLICK) 140 MG/ML SOAJ INJECT 140 MG INTO THE SKIN EVERY 14 (FOURTEEN) DAYS. 6 mL 1   ezetimibe (ZETIA) 10 MG tablet Take 1 tablet (10 mg total) by mouth daily. 90 tablet 3   ferrous sulfate 325 (65 FE) MG tablet TAKE 1 TABLET BY MOUTH TWICE A DAY WITH FOOD 180 tablet 0   glucose blood (ACCU-CHEK GUIDE) test strip Please use to check blood sugar up to three times daily. 300 strip 11   guaiFENesin-dextromethorphan (ROBITUSSIN DM) 100-10 MG/5ML syrup Take 5 mLs by mouth every 4 (four) hours as needed for cough. (Patient not taking: Reported on 02/04/2023) 118 mL 0   insulin glargine (LANTUS SOLOSTAR) 100 UNIT/ML Solostar Pen INJECT 36 UNITS INTO SKIN DAILY 15 mL 11   insulin lispro (HUMALOG KWIKPEN) 100 UNIT/ML KwikPen Inject 14-15 Units into the skin See admin instructions. Inject 14-15 units 2-3 times a day 3 mL 3   lisinopril (ZESTRIL) 10 MG tablet TAKE 1 TABLET BY MOUTH EVERY DAY 90 tablet 3   metFORMIN (GLUCOPHAGE) 1000 MG tablet TAKE 1 TABLET (1,000 MG TOTAL) BY MOUTH TWICE A DAY WITH FOOD 180 tablet 3   Omega-3 Fatty Acids (FISH OIL) 1000 MG CAPS Take 1,000 mg by mouth daily.      SEMGLEE, YFGN, 100 UNIT/ML Pen Inject into the skin daily.     senna-docusate (SENOKOT-S) 8.6-50 MG tablet Take 1 tablet by mouth at bedtime as needed for mild constipation.     No current facility-administered medications for this visit.    Allergies:   Lipitor [atorvastatin] and Diclofenac    Social History:  The patient  reports that she quit smoking about 16 years ago. Her smoking use included cigarettes. She started smoking about 50 years ago. She has a 33.8 pack-year smoking history. She has never used smokeless tobacco. She reports that she does not drink alcohol and does not use drugs.   Family History:  The patient's family history includes Diabetes in her mother; Hypertension in her father and mother.    ROS: All other systems  are reviewed and negative. Unless otherwise mentioned in H&P    PHYSICAL EXAM: VS:  There were no vitals taken for this visit. , BMI There is no height or weight on file to calculate BMI. GEN: Well nourished, well developed, in no acute distress  HEENT: normal  Neck: no JVD, carotid bruits, or masses Cardiac: RRR; no murmurs, rubs, or gallops,no edema  Respiratory:  clear to auscultation bilaterally, normal work of breathing GI: soft, nontender, nondistended, + BS MS: no deformity or atrophy  Skin: warm and dry, no rash Neuro:  Strength and sensation are intact Psych: euthymic mood, full affect   EKG:  Today shows NSR rate 65. LAD, LVH by voltage. I have personally reviewed and interpreted this study.   Recent Labs: 02/13/2022: BUN 16; Creatinine, Ser 1.09; Potassium 4.7; Sodium 142 08/14/2022: ALT 15  02/18/19: A1c 13.6%  Lipid Panel    Component Value Date/Time   CHOL 206 (H) 08/14/2022 1141   TRIG 404 (H) 08/14/2022 1141   HDL 33 (L) 08/14/2022 1141   CHOLHDL 6.2 (H) 08/14/2022 1141   CHOLHDL 5.1 04/29/2017 0557   VLDL 32 04/29/2017 0557   LDLCALC 104 (H) 08/14/2022 1141   LDLDIRECT 135 (H) 12/31/2012 0949      Wt Readings from  Last 3 Encounters:  12/03/22 171 lb (77.6 kg)  09/23/22 174 lb (78.9 kg)  09/04/22 176 lb 6.4 oz (80 kg)      Other studies Reviewed:  Cardiac Cath 04/27/2017     Prox RCA lesion is 25% stenosed. Mid RCA to Dist RCA lesion is 25% stenosed. Prox LAD lesion is 25% stenosed. The left ventricular systolic function is normal. LV end diastolic pressure is mildly elevated. LVEDP 18 mm Hg. The left ventricular ejection fraction is 55-65% by visual estimate. There is no aortic valve stenosis. Prox Cx lesion is 99% stenosed. This was the culprit lesion. A drug-eluting stent was successfully placed using a STENT SIERRA 3.50 X 18 MM. Post intervention, there is a 0% residual stenosis.   Continue dual antiplatelet therapy for at least one year with aggressive secondary prevention.        Echocardiogram 04/29/2017  Left ventricle: The cavity size was normal. There was moderate   concentric hypertrophy. Systolic function was normal. The   estimated ejection fraction was in the range of 55% to 60%.   Doppler parameters are consistent with abnormal left ventricular   relaxation (grade 1 diastolic dysfunction). There was no evidence   of elevated ventricular filling pressure by Doppler parameters. - Mitral valve: There was no regurgitation. Valve area by pressure   half-time: 2.08 cm^2. - Left atrium: The atrium was normal in size. - Right ventricle: Systolic function was normal. - Right atrium: The atrium was normal in size. - Tricuspid valve: There was no regurgitation. - Pericardium, extracardiac: The pericardium was normal in   appearance.   Impressions:   - There is hypokinesis of the basal inferior, inferolateral and mid   inferolateral walls, overall LVEF is preserved at 55-60%.    ASSESSMENT AND PLAN:  1.  CAD: S/P NSTEMI April 2019 - s/p DES of LCx.  Continue ASA 81 mg daily.   Continue Coreg and ACEi. She is asymptomatic.   2. CVA: Bilateral cerebral non-hemmorhagic  infarcts- embolic following cardiac cath/PCI. Improved.   3. Hypercholesterolemia- combined: on high dose Crestor, Zetia, fish oil. Plan follow up labs with PCP. If LDL still not at goal < 70 would consider addition of PCSK 9  inhibitor like Repatha.   4. Diabetes: poorly controlled. On metformin, Farxiga and lantus and Novolog insulin. Per primary care.   5. OSA. Reports she is using CPAP.  Follow up in one year  Amiel Mccaffrey [place] MD, [facility]     02/11/2023 10:21 AM    [facility]

## 2023-02-11 NOTE — Progress Notes (Signed)
No egg or soy allergy known to patient  No issues known to pt with past sedation with any surgeries or procedures Patient denies ever being told they had issues or difficulty with intubation  No FH of Malignant Hyperthermia Pt is not on diet pills Pt is not on  home 02  Pt is not on blood thinners  Pt denies issues with constipation  No A fib or A flutter  Have any cardiac testing pending--Yes, cardio 6-9 month f/u next week (1/27), Patient instructed if any testing is done to call back and let [facility] know, she verbalized understanding.  Pt can ambulate w/ a walker Pt denies use of chewing tobacco Discussed diabetic I weight loss medication holds Discussed NSAID holds Checked BMI Pt instructed to use [employer] or GoodRx for a price reduction on prep  Patient's chart reviewed by Holly Acosta CNRA prior to previsit and patient appropriate for the [facility].  Pre visit completed and red dot placed by patient's name on their procedure day (on provider's schedule).

## 2023-02-12 DIAGNOSIS — G4733 Obstructive sleep apnea (adult) (pediatric): Secondary | ICD-10-CM | POA: Diagnosis not present

## 2023-02-13 ENCOUNTER — Telehealth: Payer: Self-pay | Admitting: Gastroenterology

## 2023-02-13 NOTE — Telephone Encounter (Signed)
Called and spoke with patient concerning [facility] pharmacy and the process of getting the prep to her house instead of having to go to the pharmacy to pick it up;  patient verbalized understanding of information and reports she will call [facility] back and continue with the process of getting the prep mailed to her; Patient verbalized understanding of information/instructions;    Patient advised to call back to the office at [phone] should questions/concerns arise;

## 2023-02-13 NOTE — Telephone Encounter (Signed)
Inbound call from patient stating she received a message from [facility] pharmacy stating that her prep medication is ready to be ordered. Stated she is unsure why prescription was sent there and requesting for it to be [facility] to [facility] [address]. Please advise, thank you.

## 2023-02-16 ENCOUNTER — Ambulatory Visit: Payer: 59 | Admitting: Cardiology

## 2023-02-17 ENCOUNTER — Telehealth: Payer: Self-pay

## 2023-02-17 NOTE — Telephone Encounter (Signed)
Ok to proceed if patient has cardiac symptoms and she rescheduled her routine follow up appointment with cardiology

## 2023-02-17 NOTE — Telephone Encounter (Signed)
Called and spoke with patient- patient repots she is not having any cardiac symptoms, such as chest pain, shortness of breath, arm or jaw pain, no other illness symptoms other than running nose, headache, and cough (suspected flu by patient but patient reports she did not go to the doctor-she is just "staying away from people and hydrating";  Patient agreed to be rescheduled for colon until after cardiac follow up-scheduled on 03/02/2023; Patient rescheduled for 03/11/2023 and reports she was told by [facility] her prep would arrive "in a few more days"; Patient advised to call back to the office at [phone] should questions/concerns arise; Patient verbalized understanding of information/instructions;  Instructions with new date/times sent to patient via mail;

## 2023-02-17 NOTE — Telephone Encounter (Signed)
Updated instructions with rescheduled colonoscopy on 03/11/23, mailed to home address.

## 2023-02-17 NOTE — Telephone Encounter (Signed)
Dr. Lavon Paganini,  Patient's PV appt has already been completed, however, she had a cardiology follow up scheduled on 02/16/2023 but cancelled that appt but rescheduled it to after her procedure appt on 03/02/2023.  Please review patient's chart and advise if patient needs to have cardiology follow up prior to having her [facility] procedure on 02/25/2023.  Please/thank you Bre, PV RN

## 2023-02-24 NOTE — Progress Notes (Deleted)
 Cardiology Office Note   Date:  02/24/2023   ID:  Holly Acosta, Holly Acosta [birth year], MRN [id number]  PCP:  Para March, DO   Cardiologist: Dr. [place]  No chief complaint on file.    History of Present Illness: Holly Acosta is a 68 y.o. female who presents for ongoing assessment and management of coronary artery disease. She has a history of DM, HTN, HLD, and OSA. Cardiac cath in 2008 showed no significant CAD. She was admitted with a NSTEMI in April 2019.  Troponin > 65. Cardiac catheterization showed severe stenosis of the left circumflex requiring drug-eluting stent, currently on dual antiplatelet therapy with Brilinta and aspirin.  Her hospital course was complication by an acute ischemic left cerebral infarct without frank hemorrhagic transformation and acute ischemic nonhemorrhagic right cerebellar infarct. The patient was transferred to skilled nursing facility for rehab.    She was admitted in January 2021 with Covid 19 PNA with hypoxic respiratory failure. Treated with decadron and remdesivir. As a result of this sugars have been poorly controlled with A1c 13.6%. She was tried on Victoza but couldn't tolerate it due to N/V.   She has been seen by our Pharm D for lipid management. History of statin intolerance. Recommended starting Repatha in July  On follow up today she reports she is doing well. Denies any chest pain or SOB. No palpitations. No TIA or CVA symptoms. Walks with a walker. Notes sugars have been better since insulin adjusted. Now on high dose Crestor, Zetia and fish oil for hyperlipidemia.   Past Medical History:  Diagnosis Date   Acute cerebrovascular accident (CVA) of cerebellum (HCC) 04/29/2017   Acute cerebrovascular accident (CVA) of cerebellum (HCC) 04/29/2017   Acute respiratory failure with hypoxia (HCC)    AKI (acute kidney injury) (HCC)    Anemia    Arthritis of knee    "both knees" (04/24/2017)   Cataract    Cerebrovascular accident (CVA) due to occlusion  of left posterior cerebral artery (HCC) 12/03/2017   Chest pain 04/24/2017   COLONIC POLYPS, HYPERPLASTIC 09/13/2009   Qualifier: Diagnosis of  By: Delrae Alfred MD, Elizabeth     Constipation 06/02/2017   Coronary artery disease involving native coronary artery of native heart with unstable angina pectoris (HCC) 04/30/2017   Dehydration    Diabetes mellitus, type 2 (HCC)    History of blood transfusion    "related to prolapsed uterus"   HLD (hyperlipidemia)    HTN (hypertension)    Non-ST elevation (NSTEMI) myocardial infarction (HCC)    Non-ST elevation (NSTEMI) myocardial infarction (HCC)    Pneumonia due to COVID-19 virus 02/18/2019   Prolapsed uterus    Severe obstructive sleep apnea-hypopnea syndrome 01/01/2018   Sleep apnea    "have mask; haven't worn it for a long time" (04/24/2017)   Status post coronary artery stent placement    Umbilical hernia     Past Surgical History:  Procedure Laterality Date   CARDIAC CATHETERIZATION  2011   COLONOSCOPY     CORONARY STENT INTERVENTION N/A 04/27/2017   Procedure: CORONARY STENT INTERVENTION;  Surgeon: Corky Crafts, MD;  Location: [facility] INVASIVE CV LAB;  Service: Cardiovascular;  Laterality: N/A;   LEFT HEART CATH AND CORONARY ANGIOGRAPHY N/A 04/27/2017   Procedure: LEFT HEART CATH AND CORONARY ANGIOGRAPHY;  Surgeon: Corky Crafts, MD;  Location: [facility] INVASIVE CV LAB;  Service: Cardiovascular;  Laterality: N/A;   TUBAL LIGATION       Current Outpatient Medications  Medication Sig Dispense  Refill   Accu-Chek FastClix Lancets MISC 1 each by Does not apply route daily. Check sugar daily as needed 102 each 3   acetaminophen (TYLENOL) 500 MG tablet Take 1,000 mg by mouth daily as needed for mild pain (pain score 1-3).     amLODipine (NORVASC) 5 MG tablet TAKE 1 TABLET BY MOUTH EVERY DAY 90 tablet 3   aspirin EC 81 MG tablet Take 1 tablet (81 mg total) by mouth daily. 90 tablet 3   B-D UF III MINI PEN NEEDLES 31G X 5 MM MISC USE  AS DIRECTED 3 TIMES A DAY 100 each 2   Blood Glucose Monitoring Suppl (ACCU-CHEK GUIDE ME) w/Device KIT PLEASE USE TO CHECK BLOOD SUGAR UP TO THREE TIMES DAILY. 1 kit 1   carvedilol (COREG) 12.5 MG tablet Take 1 tablet (12.5 mg total) by mouth 2 (two) times daily with a meal. 180 tablet 3   Continuous Glucose Sensor (FREESTYLE LIBRE 3 SENSOR) MISC Place 1 sensor on the skin every 14 days. Use to check glucose continuously 2 each 11   cyanocobalamin ([employer] VITAMIN B12) 1000 MCG tablet Take 1 tablet (1,000 mcg total) by mouth daily. 90 tablet 1   dapagliflozin propanediol (FARXIGA) 10 MG TABS tablet Take 1 tablet (10 mg total) by mouth daily. 90 tablet 1   Evolocumab (REPATHA SURECLICK) 140 MG/ML SOAJ INJECT 140 MG INTO THE SKIN EVERY 14 (FOURTEEN) DAYS. 6 mL 1   ezetimibe (ZETIA) 10 MG tablet Take 1 tablet (10 mg total) by mouth daily. 90 tablet 3   ferrous sulfate 325 (65 FE) MG tablet TAKE 1 TABLET BY MOUTH TWICE A DAY WITH FOOD 180 tablet 0   glucose blood (ACCU-CHEK GUIDE) test strip Please use to check blood sugar up to three times daily. 300 strip 11   guaiFENesin-dextromethorphan (ROBITUSSIN DM) 100-10 MG/5ML syrup Take 5 mLs by mouth every 4 (four) hours as needed for cough. 118 mL 0   insulin glargine (LANTUS SOLOSTAR) 100 UNIT/ML Solostar Pen INJECT 36 UNITS INTO SKIN DAILY 15 mL 11   insulin lispro (HUMALOG KWIKPEN) 100 UNIT/ML KwikPen Inject 14-15 Units into the skin See admin instructions. Inject 14-15 units 2-3 times a day 3 mL 3   lisinopril (ZESTRIL) 10 MG tablet TAKE 1 TABLET BY MOUTH EVERY DAY 90 tablet 3   metFORMIN (GLUCOPHAGE) 1000 MG tablet TAKE 1 TABLET (1,000 MG TOTAL) BY MOUTH TWICE A DAY WITH FOOD 180 tablet 3   Omega-3 Fatty Acids (FISH OIL) 1000 MG CAPS Take 1,000 mg by mouth daily.     PEG 3350-KCl-NaCl-NaSulf-MgSul (SUFLAVE) 178.7 g SOLR Take 1 kit by mouth as directed. 1 each 0   SEMGLEE, YFGN, 100 UNIT/ML Pen Inject into the skin daily. (Patient not taking: Reported on  02/11/2023)     senna-docusate (SENOKOT-S) 8.6-50 MG tablet Take 1 tablet by mouth at bedtime as needed for mild constipation. (Patient not taking: Reported on 02/11/2023)     No current facility-administered medications for this visit.    Allergies:   Lipitor [atorvastatin] and Diclofenac    Social History:  The patient  reports that she quit smoking about 16 years ago. Her smoking use included cigarettes. She started smoking about 50 years ago. She has a 33.8 pack-year smoking history. She has never used smokeless tobacco. She reports that she does not drink alcohol and does not use drugs.   Family History:  The patient's family history includes Diabetes in her mother; Hypertension in her father and mother.  ROS: All other systems are reviewed and negative. Unless otherwise mentioned in H&P    PHYSICAL EXAM: VS:  There were no vitals taken for this visit. , BMI There is no height or weight on file to calculate BMI. GEN: Well nourished, well developed, in no acute distress  HEENT: normal  Neck: no JVD, carotid bruits, or masses Cardiac: RRR; no murmurs, rubs, or gallops,no edema  Respiratory:  clear to auscultation bilaterally, normal work of breathing GI: soft, nontender, nondistended, + BS MS: no deformity or atrophy  Skin: warm and dry, no rash Neuro:  Strength and sensation are intact Psych: euthymic mood, full affect   EKG:  Today shows NSR rate 65. LAD, LVH by voltage. I have personally reviewed and interpreted this study.   Recent Labs: 08/14/2022: ALT 15  02/18/19: A1c 13.6%  Lipid Panel    Component Value Date/Time   CHOL 206 (H) 08/14/2022 1141   TRIG 404 (H) 08/14/2022 1141   HDL 33 (L) 08/14/2022 1141   CHOLHDL 6.2 (H) 08/14/2022 1141   CHOLHDL 5.1 04/29/2017 0557   VLDL 32 04/29/2017 0557   LDLCALC 104 (H) 08/14/2022 1141   LDLDIRECT 135 (H) 12/31/2012 0949      Wt Readings from Last 3 Encounters:  02/11/23 171 lb (77.6 kg)  12/03/22 171 lb (77.6 kg)   09/23/22 174 lb (78.9 kg)      Other studies Reviewed:  Cardiac Cath 04/27/2017     Prox RCA lesion is 25% stenosed. Mid RCA to Dist RCA lesion is 25% stenosed. Prox LAD lesion is 25% stenosed. The left ventricular systolic function is normal. LV end diastolic pressure is mildly elevated. LVEDP 18 mm Hg. The left ventricular ejection fraction is 55-65% by visual estimate. There is no aortic valve stenosis. Prox Cx lesion is 99% stenosed. This was the culprit lesion. A drug-eluting stent was successfully placed using a STENT SIERRA 3.50 X 18 MM. Post intervention, there is a 0% residual stenosis.   Continue dual antiplatelet therapy for at least one year with aggressive secondary prevention.        Echocardiogram 04/29/2017  Left ventricle: The cavity size was normal. There was moderate   concentric hypertrophy. Systolic function was normal. The   estimated ejection fraction was in the range of 55% to 60%.   Doppler parameters are consistent with abnormal left ventricular   relaxation (grade 1 diastolic dysfunction). There was no evidence   of elevated ventricular filling pressure by Doppler parameters. - Mitral valve: There was no regurgitation. Valve area by pressure   half-time: 2.08 cm^2. - Left atrium: The atrium was normal in size. - Right ventricle: Systolic function was normal. - Right atrium: The atrium was normal in size. - Tricuspid valve: There was no regurgitation. - Pericardium, extracardiac: The pericardium was normal in   appearance.   Impressions:   - There is hypokinesis of the basal inferior, inferolateral and mid   inferolateral walls, overall LVEF is preserved at 55-60%.    ASSESSMENT AND PLAN:  1.  CAD: S/P NSTEMI April 2019 - s/p DES of LCx.  Continue ASA 81 mg daily.   Continue Coreg and ACEi. She is asymptomatic.   2. CVA: Bilateral cerebral non-hemmorhagic infarcts- embolic following cardiac cath/PCI. Improved.   3. Hypercholesterolemia-  combined: on high dose Crestor, Zetia, fish oil. Plan follow up labs with PCP. If LDL still not at goal < 70 would consider addition of PCSK 9 inhibitor like Repatha.   4. Diabetes: poorly  controlled. On metformin, Farxiga and lantus and Novolog insulin. Per primary care.   5. OSA. Reports she is using CPAP.  Follow up in one year  Darya Bigler [place] MD, [facility]     02/24/2023 9:45 AM    [facility]

## 2023-02-25 ENCOUNTER — Encounter: Payer: 59 | Admitting: Gastroenterology

## 2023-03-02 ENCOUNTER — Ambulatory Visit: Payer: 59 | Admitting: Cardiology

## 2023-03-09 ENCOUNTER — Ambulatory Visit (INDEPENDENT_AMBULATORY_CARE_PROVIDER_SITE_OTHER): Payer: 59 | Admitting: Family Medicine

## 2023-03-09 ENCOUNTER — Encounter: Payer: Self-pay | Admitting: Family Medicine

## 2023-03-09 VITALS — BP 157/86 | HR 81 | Ht 64.0 in | Wt 170.2 lb

## 2023-03-09 DIAGNOSIS — I152 Hypertension secondary to endocrine disorders: Secondary | ICD-10-CM | POA: Diagnosis not present

## 2023-03-09 DIAGNOSIS — Z794 Long term (current) use of insulin: Secondary | ICD-10-CM | POA: Diagnosis not present

## 2023-03-09 DIAGNOSIS — Z1231 Encounter for screening mammogram for malignant neoplasm of breast: Secondary | ICD-10-CM | POA: Diagnosis not present

## 2023-03-09 DIAGNOSIS — E119 Type 2 diabetes mellitus without complications: Secondary | ICD-10-CM | POA: Diagnosis not present

## 2023-03-09 DIAGNOSIS — Z1239 Encounter for other screening for malignant neoplasm of breast: Secondary | ICD-10-CM | POA: Insufficient documentation

## 2023-03-09 DIAGNOSIS — E1159 Type 2 diabetes mellitus with other circulatory complications: Secondary | ICD-10-CM

## 2023-03-09 LAB — POCT GLYCOSYLATED HEMOGLOBIN (HGB A1C): HbA1c, POC (controlled diabetic range): 12.3 % — AB (ref 0.0–7.0)

## 2023-03-09 MED ORDER — AMLODIPINE BESYLATE 10 MG PO TABS: 10.0000 mg | ORAL_TABLET | Freq: Every day | ORAL | 3 refills | Status: DC

## 2023-03-09 NOTE — Assessment & Plan Note (Signed)
Due for mammogram, placed order today and provided phone number to schedule

## 2023-03-09 NOTE — Assessment & Plan Note (Addendum)
Uncontrolled, BGL's on CGM sensor elevated 200s.  Do not want to increase insulin at this time because she has not been taking it properly.  Advised her to begin taking her insulin as prescribed again and then we will follow-up in 1 to 2 weeks to reassess. - BMP, A1c, urine microalbumin creatinine ratio collected today

## 2023-03-09 NOTE — Progress Notes (Unsigned)
    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  Do not want to increase insulin at this time because she has not been taking it properly.  Advised her to begin taking her insulin as prescribed again and then we will follow-up in 1 to 2 weeks to reassess. - BMP, A1c, urine microalbumin creatinine ratio collected today     Hypertension associated with diabetes (HCC) Uncontrolled, elevated 177/92 today, repeat 157/86.  Has been taking blood pressure medications as prescribed -Increase amlodipine to 10 mg daily, continue lisinopril 10mg  daily and coreg 12.5mg  daily - Will reassess at next appointment 1 to 2 weeks  Screening for breast cancer Due for mammogram, placed order today and provided phone number to schedule   Para March, DO [facility]

## 2023-03-09 NOTE — Assessment & Plan Note (Addendum)
Uncontrolled, elevated 177/92 today, repeat 157/86.  Has been taking blood pressure medications as prescribed -Increase amlodipine to 10 mg daily, continue lisinopril 10mg  daily and coreg 12.5mg  daily - Will reassess at next appointment 1 to 2 weeks

## 2023-03-09 NOTE — Patient Instructions (Signed)
It was great to see you today! Thank you for choosing [facility] for your primary care. Holly Acosta was seen for routine check up.  Today we addressed: I have increased your amlodipine to 10 mg for your blood pressure Please start taking your insulin again as prescribed.  I want you to come back in 1 to 2 weeks to recheck your blood sugars Please call to schedule your mammogram when you are able Call the audiologist to get your hearing aids   We are checking some labs today. If they are abnormal, I will call you. If they are normal, I will send you a MyChart message (if it is active) or a letter in the mail. If you do not hear about your labs in the next 2 weeks, please call the office.  You should return to our clinic Return in about 1 week (around 03/16/2023) for DM F/U. Please arrive 15 minutes before your appointment to ensure smooth check in process.  We appreciate your efforts in making this happen.  Thank you for allowing me to participate in your care, Para March, DO 03/09/2023, 4:31 PM

## 2023-03-10 ENCOUNTER — Ambulatory Visit: Payer: Self-pay

## 2023-03-10 LAB — BASIC METABOLIC PANEL
BUN/Creatinine Ratio: 13 (ref 12–28)
BUN: 13 mg/dL (ref 8–27)
CO2: 24 mmol/L (ref 20–29)
Calcium: 10 mg/dL (ref 8.7–10.3)
Chloride: 103 mmol/L (ref 96–106)
Creatinine, Ser: 0.97 mg/dL (ref 0.57–1.00)
Glucose: 150 mg/dL — ABNORMAL HIGH (ref 70–99)
Potassium: 3.9 mmol/L (ref 3.5–5.2)
Sodium: 142 mmol/L (ref 134–144)
eGFR: 64 mL/min/{1.73_m2} (ref >59)

## 2023-03-10 LAB — MICROALBUMIN / CREATININE URINE RATIO
Creatinine, Urine: 20.7 mg/dL
Microalb/Creat Ratio: 875 mg/g{creat} — ABNORMAL HIGH (ref 0–29)
Microalbumin, Urine: 181.2 ug/mL

## 2023-03-10 NOTE — Patient Instructions (Signed)
Visit Information  Thank you for taking time to visit with me today. Please don't hesitate to contact me if I can be of assistance to you.   Following are the goals we discussed today:   Goals Addressed             This Visit's Progress    Diabetes Management       Care Coordination Interventions: Provided education to patient about basic DM disease process Reviewed medications with patient and discussed importance of medication adherence Discussed plans with patient for ongoing care management follow up and provided patient with direct contact information for care management team  Patient reports she is doing okay. She saw her PCP yesterday and admitted to not taking her insulin regularly.  She declines any financial problems paying for insulin she just does not take it regularly.  Discussed how diabetes is a constant work and it is easy to get weary in managing diabetes from day to day.  A1c noted to be 12.3.  Patient reports blood sugars in the 200's right now. Discussed her A1c, diabetes and complications of uncontrolled diabetes. Patient PHQ-9=5 .  Discussed depression and patient declined treatment at this time.  She states she will follow up with PCP on 03-20-23.  Encouraged follow up and medication adherence.  She verbalized understanding.           Our next appointment is by telephone on 04/07/23 at 0930   Please call the care guide team at [phone] if you need to cancel or reschedule your appointment.   If you are experiencing a Mental Health or Behavioral Health Crisis or need someone to talk to, please call the Suicide and Crisis Lifeline: 988   The patient verbalized understanding of instructions, educational materials, and care plan provided today and DECLINED offer to receive copy of patient instructions, educational materials, and care plan.   The patient has been provided with contact information for the care management team and has been advised to call with any  health related questions or concerns.   Bary Leriche RN, MSN [facility], [facility] RN Care Manager Direct Dial: [phone]  Fax: [phone] Website: [email or web address]

## 2023-03-10 NOTE — Progress Notes (Signed)
A1C elevated at 12.3, as to be expected with pt not taking insulin as prescribed. Coming back in 2 weeks for re-eval of diabetes

## 2023-03-10 NOTE — Patient Outreach (Signed)
  Care Coordination   Follow Up Visit Note   03/10/2023 Name: Holly Acosta MRN: [id number] DOB: [birth year]  Holly Acosta is a 68 y.o. year old female who sees Everhart, Kirstie, DO for primary care. I spoke with  Holly Acosta by phone today.  What matters to the patients health and wellness today?  Diabetes management    Goals Addressed             This Visit's Progress    Diabetes Management       Care Coordination Interventions: Provided education to patient about basic DM disease process Reviewed medications with patient and discussed importance of medication adherence Discussed plans with patient for ongoing care management follow up and provided patient with direct contact information for care management team  Patient reports she is doing okay. She saw her PCP yesterday and admitted to not taking her insulin regularly.  She declines any financial problems paying for insulin she just does not take it regularly.  Discussed how diabetes is a constant work and it is easy to get weary in managing diabetes from day to day.  A1c noted to be 12.3.  Patient reports blood sugars in the 200's right now. Discussed her A1c, diabetes and complications of uncontrolled diabetes. Patient PHQ-9=5 .  Discussed depression and patient declined treatment at this time.  She states she will follow up with PCP on 03-20-23.  Encouraged follow up and medication adherence.  She verbalized understanding.           SDOH assessments and interventions completed:  Yes  SDOH Interventions Today    Flowsheet Row Most Recent Value  SDOH Interventions   Depression Interventions/Treatment  Patient refuses Treatment        Care Coordination Interventions:  Yes, provided   Follow up plan: Follow up call scheduled for March    Encounter Outcome:  Patient Visit Completed   Bary Leriche RN, MSN [facility], [facility] RN Care Manager Direct Dial: [phone]  Fax:  [phone] Website: [email or web address]

## 2023-03-10 NOTE — Progress Notes (Signed)
Patient called.  Patient aware of results

## 2023-03-11 ENCOUNTER — Encounter: Payer: 59 | Admitting: Gastroenterology

## 2023-03-12 ENCOUNTER — Other Ambulatory Visit: Payer: Self-pay | Admitting: Family Medicine

## 2023-03-13 NOTE — Progress Notes (Signed)
Severely increased microalbumin/creatinine. Likely due to uncontrolled diabetes. F/u appointment in 1 week. Already on ACEI. Caution starting SGLT2 with elevated A1C, consider GLP1

## 2023-03-13 NOTE — Progress Notes (Deleted)
 Cardiology Office Note   Date:  03/13/2023   ID:  Holly, Acosta December 07, 1955, MRN [id number]  PCP:  Para March, DO   Cardiologist: Dr. Swaziland  No chief complaint on file.    History of Present Illness: Holly Acosta is a 68 y.o. female who presents for ongoing assessment and management of coronary artery disease. She has a history of DM, HTN, HLD, and OSA. Cardiac cath in 2008 showed no significant CAD. She was admitted with a NSTEMI in April 2019.  Troponin > 65. Cardiac catheterization showed severe stenosis of the left circumflex requiring drug-eluting stent, currently on dual antiplatelet therapy with Brilinta and aspirin.  Her hospital course was complication by an acute ischemic left cerebral infarct without frank hemorrhagic transformation and acute ischemic nonhemorrhagic right cerebellar infarct. The patient was transferred to skilled nursing facility for rehab.    She was admitted in January 2021 with Covid 19 PNA with hypoxic respiratory failure. Treated with decadron and remdesivir. As a result of this sugars have been poorly controlled with A1c 13.6%. She was tried on Victoza but couldn't tolerate it due to N/V.   She has been seen by our Pharm D for lipid management. History of statin intolerance. Recommended starting Repatha in July  On follow up today she reports she is doing well. Denies any chest pain or SOB. No palpitations. No TIA or CVA symptoms. Walks with a walker. Notes sugars have been better since insulin adjusted. Now on high dose Crestor, Zetia and fish oil for hyperlipidemia.   Past Medical History:  Diagnosis Date   Acute cerebrovascular accident (CVA) of cerebellum (HCC) 04/29/2017   Acute cerebrovascular accident (CVA) of cerebellum (HCC) 04/29/2017   Acute respiratory failure with hypoxia (HCC)    AKI (acute kidney injury) (HCC)    Anemia    Arthritis of knee    "both knees" (04/24/2017)   Cataract    Cerebrovascular accident (CVA) due to  occlusion of left posterior cerebral artery (HCC) 12/03/2017   Chest pain 04/24/2017   COLONIC POLYPS, HYPERPLASTIC 09/13/2009   Qualifier: Diagnosis of  By: Delrae Alfred MD, Elizabeth     Constipation 06/02/2017   Coronary artery disease involving native coronary artery of native heart with unstable angina pectoris (HCC) 04/30/2017   Dehydration    Diabetes mellitus, type 2 (HCC)    Excessive daytime sleepiness 12/03/2017   History of blood transfusion    "related to prolapsed uterus"   HLD (hyperlipidemia)    HTN (hypertension)    Non-ST elevation (NSTEMI) myocardial infarction (HCC)    Non-ST elevation (NSTEMI) myocardial infarction (HCC)    Pneumonia due to COVID-19 virus 02/18/2019   Prolapsed uterus    Severe obstructive sleep apnea-hypopnea syndrome 01/01/2018   Sleep apnea    "have mask; haven't worn it for a long time" (04/24/2017)   Status post coronary artery stent placement    Umbilical hernia    Uterine prolapse 01/20/2006   Qualifier: Diagnosis of   By: Delrae Alfred MD, Elmer Sow hernia 01/26/2009   Qualifier: Diagnosis of   By: Delrae Alfred MD, Lanora Manis      Replacing diagnoses that were inactivated after the 04/21/22 regulatory import      Past Surgical History:  Procedure Laterality Date   CARDIAC CATHETERIZATION  2011   COLONOSCOPY     CORONARY STENT INTERVENTION N/A 04/27/2017   Procedure: CORONARY STENT INTERVENTION;  Surgeon: Corky Crafts, MD;  Location: [facility] INVASIVE CV  LAB;  Service: Cardiovascular;  Laterality: N/A;   LEFT HEART CATH AND CORONARY ANGIOGRAPHY N/A 04/27/2017   Procedure: LEFT HEART CATH AND CORONARY ANGIOGRAPHY;  Surgeon: Corky Crafts, MD;  Location: [facility] INVASIVE CV LAB;  Service: Cardiovascular;  Laterality: N/A;   TUBAL LIGATION       Current Outpatient Medications  Medication Sig Dispense Refill   amLODipine (NORVASC) 10 MG tablet Take 1 tablet (10 mg total) by mouth at bedtime. 90 tablet 3   aspirin EC 81 MG tablet  Take 1 tablet (81 mg total) by mouth daily. 90 tablet 3   carvedilol (COREG) 12.5 MG tablet Take 1 tablet (12.5 mg total) by mouth 2 (two) times daily with a meal. 180 tablet 3   Continuous Glucose Sensor (FREESTYLE LIBRE 3 SENSOR) MISC Place 1 sensor on the skin every 14 days. Use to check glucose continuously 2 each 11   cyanocobalamin ([employer] VITAMIN B12) 1000 MCG tablet Take 1 tablet (1,000 mcg total) by mouth daily. 90 tablet 1   dapagliflozin propanediol (FARXIGA) 10 MG TABS tablet Take 1 tablet (10 mg total) by mouth daily. 90 tablet 1   Evolocumab (REPATHA SURECLICK) 140 MG/ML SOAJ INJECT 140 MG INTO THE SKIN EVERY 14 (FOURTEEN) DAYS. 6 mL 1   ezetimibe (ZETIA) 10 MG tablet Take 1 tablet (10 mg total) by mouth daily. 90 tablet 3   ferrous sulfate 325 (65 FE) MG tablet TAKE 1 TABLET BY MOUTH TWICE A DAY WITH FOOD 180 tablet 0   insulin glargine (LANTUS SOLOSTAR) 100 UNIT/ML Solostar Pen INJECT 36 UNITS INTO SKIN DAILY 15 mL 11   insulin lispro (HUMALOG KWIKPEN) 100 UNIT/ML KwikPen Inject 14-15 Units into the skin See admin instructions. Inject 14-15 units 2-3 times a day 3 mL 3   lisinopril (ZESTRIL) 10 MG tablet TAKE 1 TABLET BY MOUTH EVERY DAY 90 tablet 3   metFORMIN (GLUCOPHAGE) 1000 MG tablet TAKE 1 TABLET (1,000 MG TOTAL) BY MOUTH TWICE A DAY WITH FOOD 180 tablet 3   Omega-3 Fatty Acids (FISH OIL) 1000 MG CAPS Take 1,000 mg by mouth daily.     PEG 3350-KCl-NaCl-NaSulf-MgSul (SUFLAVE) 178.7 g SOLR Take 1 kit by mouth as directed. 1 each 0   No current facility-administered medications for this visit.    Allergies:   Lipitor [atorvastatin] and Diclofenac    Social History:  The patient  reports that she quit smoking about 16 years ago. Her smoking use included cigarettes. She started smoking about 50 years ago. She has a 33.8 pack-year smoking history. She has never used smokeless tobacco. She reports that she does not drink alcohol and does not use drugs.   Family History:  The  patient's family history includes Diabetes in her mother; Hypertension in her father and mother.    ROS: All other systems are reviewed and negative. Unless otherwise mentioned in H&P    PHYSICAL EXAM: VS:  There were no vitals taken for this visit. , BMI There is no height or weight on file to calculate BMI. GEN: Well nourished, well developed, in no acute distress  HEENT: normal  Neck: no JVD, carotid bruits, or masses Cardiac: RRR; no murmurs, rubs, or gallops,no edema  Respiratory:  clear to auscultation bilaterally, normal work of breathing GI: soft, nontender, nondistended, + BS MS: no deformity or atrophy  Skin: warm and dry, no rash Neuro:  Strength and sensation are intact Psych: euthymic mood, full affect   EKG:  Today shows NSR rate 65. LAD,  LVH by voltage. I have personally reviewed and interpreted this study.   Recent Labs: 08/14/2022: ALT 15 03/09/2023: BUN 13; Creatinine, Ser 0.97; Potassium 3.9; Sodium 142  02/18/19: A1c 13.6%  Lipid Panel    Component Value Date/Time   CHOL 206 (H) 08/14/2022 1141   TRIG 404 (H) 08/14/2022 1141   HDL 33 (L) 08/14/2022 1141   CHOLHDL 6.2 (H) 08/14/2022 1141   CHOLHDL 5.1 04/29/2017 0557   VLDL 32 04/29/2017 0557   LDLCALC 104 (H) 08/14/2022 1141   LDLDIRECT 135 (H) 12/31/2012 0949      Wt Readings from Last 3 Encounters:  03/09/23 170 lb 3.2 oz (77.2 kg)  02/11/23 171 lb (77.6 kg)  12/03/22 171 lb (77.6 kg)      Other studies Reviewed:  Cardiac Cath 04/27/2017     Prox RCA lesion is 25% stenosed. Mid RCA to Dist RCA lesion is 25% stenosed. Prox LAD lesion is 25% stenosed. The left ventricular systolic function is normal. LV end diastolic pressure is mildly elevated. LVEDP 18 mm Hg. The left ventricular ejection fraction is 55-65% by visual estimate. There is no aortic valve stenosis. Prox Cx lesion is 99% stenosed. This was the culprit lesion. A drug-eluting stent was successfully placed using a STENT SIERRA  3.50 X 18 MM. Post intervention, there is a 0% residual stenosis.   Continue dual antiplatelet therapy for at least one year with aggressive secondary prevention.        Echocardiogram 04/29/2017  Left ventricle: The cavity size was normal. There was moderate   concentric hypertrophy. Systolic function was normal. The   estimated ejection fraction was in the range of 55% to 60%.   Doppler parameters are consistent with abnormal left ventricular   relaxation (grade 1 diastolic dysfunction). There was no evidence   of elevated ventricular filling pressure by Doppler parameters. - Mitral valve: There was no regurgitation. Valve area by pressure   half-time: 2.08 cm^2. - Left atrium: The atrium was normal in size. - Right ventricle: Systolic function was normal. - Right atrium: The atrium was normal in size. - Tricuspid valve: There was no regurgitation. - Pericardium, extracardiac: The pericardium was normal in   appearance.   Impressions:   - There is hypokinesis of the basal inferior, inferolateral and mid   inferolateral walls, overall LVEF is preserved at 55-60%.    ASSESSMENT AND PLAN:  1.  CAD: S/P NSTEMI April 2019 - s/p DES of LCx.  Continue ASA 81 mg daily.   Continue Coreg and ACEi. She is asymptomatic.   2. CVA: Bilateral cerebral non-hemmorhagic infarcts- embolic following cardiac cath/PCI. Improved.   3. Hypercholesterolemia- combined: on high dose Crestor, Zetia, fish oil. Plan follow up labs with PCP. If LDL still not at goal < 70 would consider addition of PCSK 9 inhibitor like Repatha.   4. Diabetes: poorly controlled. On metformin, Farxiga and lantus and Novolog insulin. Per primary care.   5. OSA. Reports she is using CPAP.  Follow up in one year  Adyen Bifulco [place] MD, [facility]     03/13/2023 4:06 PM    [facility]

## 2023-03-15 DIAGNOSIS — G4733 Obstructive sleep apnea (adult) (pediatric): Secondary | ICD-10-CM | POA: Diagnosis not present

## 2023-03-17 ENCOUNTER — Ambulatory Visit: Payer: 59 | Admitting: Neurology

## 2023-03-17 ENCOUNTER — Ambulatory Visit: Payer: 59 | Admitting: Cardiology

## 2023-03-17 DIAGNOSIS — G4733 Obstructive sleep apnea (adult) (pediatric): Secondary | ICD-10-CM

## 2023-03-17 DIAGNOSIS — G4734 Idiopathic sleep related nonobstructive alveolar hypoventilation: Secondary | ICD-10-CM

## 2023-03-18 NOTE — Progress Notes (Signed)
 [facility]   MRN:  [id number]    Holly Acosta. Sarin Female, 68 y.o., 07/29/1955   HOME SLEEP TEST REPORT ( by Watch PAT)   STUDY DATE:  03-18-2023  ORDERING CLINICIAN:  Shawnie Dapper, NP  REFERRING CLINICIAN: Everhart, Kristie, DO    CLINICAL INFORMATION/HISTORY:  Patient is a current BIPAP user, dx with OSA,  there is a history of DM, HTN, Left PICA infarct. Myocardial infarction . Pneumonia COVID 19 in January 2021. Pt is here Alone. Pt states that her machine says "error" and then shut it off. Pt states that she took her machine to DME and they stated that she needed to see her doctor for a [employer].       HISTORY OF PRESENT ILLNESS:   12/03/22 ALL: Holly Acosta returns for follow up for OSA on BiPAP. She was last seen 11/2021. Compliance had improved but she was having trouble with increased air leak. Since, she reports doing well. She has an older machine and is receiving an error message on her machine. She has not used her machine for the past week due to this message. She reports that her machine will turn off spontaneously. She is eligible for a new machine in 2020.   PS :  AHI was 4.2/h and uses FFM with high air-leakage. EPAP was 18 and IPAP 21 cm water pressure.      Epworth sleepiness score: ESS: 13/24, previously 12 FSS: 23/63   BMI:  29.4 kg/m   Neck Circumference: NA    FINDINGS:   Sleep Summary:   Total Recording Time (hours, min): 7 hours 40 minutes      Total Sleep Time (hours, min):       6 hours 46 minutes          Percent REM (%):     27%                                   Respiratory Indices:   Calculated pAHI (per hour):    Following CMS criteria, this patient has a total AHI of 32.2/h                         REM pAHI:      49.2/h following CMS criteria.                                           NREM pAHI:    25.5/h following CMS criteria.                          Positional AHI:    The patient slept almost exclusively in  supine position for 393 minutes with an AHI of 31.7/h there were only about 14 minutes of sleep recorded while in left lateral position and here the AHI was higher 35.6/h.   Snoring reached a mean volume of 40 dB and this is the threshold of detection for this home sleep test device.  Snoring was present for 12.7% of total sleep time less than 1 hour total.  Oxygen Saturation Statistics:   Oxygen Saturation (%) Mean:       90%        Minimum oxygen saturation (%):     58%!       O2 Saturation Range (%):     Between 58 and 97%                                  O2 Saturation (minutes) <89%:    60 minutes,  the equivalent of 15% of total sleep time! O2 saturation in minutes under 90% was 94 minutes and 23% of total sleep time.      Pulse Rate Statistics:   Pulse Mean (bpm):     73 bpm            Pulse Range:    Between 56 and 98 bpm             IMPRESSION:  This HST confirms the presence of all obstructive severe sleep apnea and associated with a REM sleep dependent hypoxia. This pattern can be seen in obesity hypoventilation, and COPD, and also in patients with diaphragmatic weakness.   RECOMMENDATION:   The associated hypoxia for this BiPAP dependent patient is so severe that I would like for her to return to the sleep lab to be titrated to BiPAP and possibly additional oxygen.  This titration to oxygen cannot take place in an ambulatory setting and has to be done while under positive airway pressure therapy.  I will order this follow-up study for the patient today.   Should her current device no longer work, I will be happy to replace the BiPAP with an at BiPAP at similar settings but still want the patient to be evaluated in the sleep lab for possible hypoxemia and oxygen titration.     INTERPRETING PHYSICIAN:    Melvyn Novas, MD    CC Shawnie Dapper, NP

## 2023-03-20 ENCOUNTER — Encounter: Payer: Self-pay | Admitting: Family Medicine

## 2023-03-20 ENCOUNTER — Ambulatory Visit: Payer: 59 | Admitting: Family Medicine

## 2023-03-20 VITALS — BP 135/75 | HR 92 | Ht 64.0 in | Wt 174.1 lb

## 2023-03-20 DIAGNOSIS — I152 Hypertension secondary to endocrine disorders: Secondary | ICD-10-CM

## 2023-03-20 DIAGNOSIS — E1159 Type 2 diabetes mellitus with other circulatory complications: Secondary | ICD-10-CM

## 2023-03-20 DIAGNOSIS — E119 Type 2 diabetes mellitus without complications: Secondary | ICD-10-CM | POA: Diagnosis not present

## 2023-03-20 DIAGNOSIS — E1165 Type 2 diabetes mellitus with hyperglycemia: Secondary | ICD-10-CM | POA: Diagnosis not present

## 2023-03-20 DIAGNOSIS — Z794 Long term (current) use of insulin: Secondary | ICD-10-CM | POA: Diagnosis not present

## 2023-03-20 MED ORDER — OLMESARTAN MEDOXOMIL-HCTZ 20-12.5 MG PO TABS: 1.0000 | ORAL_TABLET | Freq: Every day | ORAL | 1 refills | Status: DC

## 2023-03-20 MED ORDER — LANTUS SOLOSTAR 100 UNIT/ML ~~LOC~~ SOPN
PEN_INJECTOR | SUBCUTANEOUS | 11 refills | Status: DC
Start: 2023-03-20 — End: 2023-03-27

## 2023-03-20 NOTE — Assessment & Plan Note (Signed)
 Uncontrolled, elevated in clinic today but improved from last visit.  - Stop lisinopril, start Benicar 20-12.5mg   - Lab visit scheduled for BMP in 1 week, BMP ordered for future

## 2023-03-20 NOTE — Assessment & Plan Note (Addendum)
 Uncontrolled, but CGM readings seem to have improved some since her last visit. She did have 3 low readings over the last 2 weeks. Pt has difficulty explaining her insulin regimen. Previously followed with Dr. Raymondo Band regularly for assistance with this.  - Will decrease LAI to 28U daily - Continue SAI TID with meals, encouraged to take it with her midnight snack if it contains carbs - Hold Farxiga for now due to A1C>12 - Will need to discuss GLP1 with her, did not want to add that onto already lengthy discussion/education today - Appt with Dr. Raymondo Band next week to review regimen and with me in 2 weeks - Instructions written, highlighted, and reviewed with pt and she was able to tell me her new insulin regimen prior to leaving clinic

## 2023-03-20 NOTE — Progress Notes (Signed)
    SUBJECTIVE:   CHIEF COMPLAINT / HPI:   Diabetes F/u - has restarted her insulin and farxiga - taking 32U LAI in mornings, then takes SAI 14-18U usually TID. States she will take it around 12pm, 4pm, and before bed but is not consistent. States she tries to take it with meals - occasionally has midnight snack before bed of banana or cereal - pt has difficulty recalling her exact diabetes regimen  HTN F/u - increased amlodipine to 10mg  at last visit, still taking coreg 12.5mg  daily and lisinopril 10mg  daily - unable to check BP at home - tolerating med adjustment well  PERTINENT  PMH / PSH: CAD, HTN, OSA, HLD, IDT2DM, OA, CKD3a, difficulty hearing   OBJECTIVE:   BP 135/75   Pulse 92   Ht 5\' 4"  (1.626 m)   Wt 174 lb 2 oz (79 kg)   SpO2 96%   BMI 29.89 kg/m   Gen: well appearing, NAD, ambulates with walker. Hard of hearing CV: RRR, no m/r/g Resp: normal WOB on RA, CTAB  ASSESSMENT/PLAN:   Insulin dependent type 2 diabetes mellitus (HCC) Uncontrolled, but CGM readings seem to have improved some since her last visit. She did have 3 low readings over the last 2 weeks. Pt has difficulty explaining her insulin regimen. Previously followed with Dr. Raymondo Band regularly for assistance with this.  - Will decrease LAI to 28U daily - Continue SAI TID with meals, encouraged to take it with her midnight snack if it contains carbs - Hold Farxiga for now due to A1C>12 - Will need to discuss GLP1 with her, did not want to add that onto already lengthy discussion/education today - Appt with Dr. Raymondo Band next week to review regimen and with me in 2 weeks - Instructions written, highlighted, and reviewed with pt and she was able to tell me her new insulin regimen prior to leaving clinic        Hypertension associated with diabetes (HCC) Uncontrolled, elevated in clinic today but improved from last visit.  - Stop lisinopril, start Benicar 20-12.5mg   - Lab visit scheduled for BMP in 1 week,  BMP ordered for future    Para March, DO [facility]

## 2023-03-20 NOTE — Patient Instructions (Signed)
 Start taking 34 units of your long acting insulin in the morning. Take your short acting insulin WITH MEALS, including midnight snack that has carbs. STOP your Marcelline Deist until we get your A1C under control.  START your new blood pressure medicine on Monday. STOP your Lisinopril. PLEASE SEE APPOINTMENTS SCHEDULED BELOW.   If you haven't already, sign up for My Chart to have easy access to your labs results, and communication with your primary care physician.  Call the clinic at [phone] if your symptoms worsen or you have any concerns.  Please arrive 15 minutes before your appointment to ensure smooth check in process.  We appreciate your efforts in making this happen.  Thank you for allowing me to participate in your care, Para March, DO 03/20/2023, 3:43 PM PGY-1, [facility]

## 2023-03-23 ENCOUNTER — Other Ambulatory Visit: Payer: Self-pay | Admitting: Neurology

## 2023-03-23 ENCOUNTER — Telehealth: Payer: Self-pay | Admitting: Neurology

## 2023-03-23 DIAGNOSIS — I25110 Atherosclerotic heart disease of native coronary artery with unstable angina pectoris: Secondary | ICD-10-CM

## 2023-03-23 DIAGNOSIS — E119 Type 2 diabetes mellitus without complications: Secondary | ICD-10-CM

## 2023-03-23 DIAGNOSIS — G4733 Obstructive sleep apnea (adult) (pediatric): Secondary | ICD-10-CM

## 2023-03-23 DIAGNOSIS — E1159 Type 2 diabetes mellitus with other circulatory complications: Secondary | ICD-10-CM

## 2023-03-23 DIAGNOSIS — G4734 Idiopathic sleep related nonobstructive alveolar hypoventilation: Secondary | ICD-10-CM | POA: Insufficient documentation

## 2023-03-23 NOTE — Procedures (Signed)
 This order was not released- as of 03-23-2023.    [facility]  MRN:  [id number]   Ivette Castronova. Ferencz Female, 68 y.o., [birth year]   HOME SLEEP TEST REPORT ( by Watch PAT)   STUDY DATE:  03-18-2023  ORDERING CLINICIAN:  Shawnie Dapper, NP  REFERRING CLINICIAN: Everhart, Kristie, DO    CLINICAL INFORMATION/HISTORY:  Patient is a current BIPAP user, dx with OSA,  there is a history of DM, HTN, Left PICA infarct. Myocardial infarction . Pneumonia COVID 19 in January 2021. Pt is here Alone. Pt states that her machine says "error" and then shut it off. Pt states that she took her machine to DME and they stated that she needed to see her doctor for a [employer].      HISTORY OF PRESENT ILLNESS:   12/03/22 ALL: Marlynn returns for follow up for OSA on BiPAP. She was last seen 11/2021. Compliance had improved but she was having trouble with increased air leak. Since, she reports doing well. She has an older machine and is receiving an error message on her machine. She has not used her machine for the past week due to this message. She reports that her machine will turn off spontaneously. She is eligible for a new machine in 2020.  PS :  AHI was 4.2/h and uses FFM with high air-leakage. EPAP was 18 and IPAP 21 cm water pressure.     Epworth sleepiness score: ESS: 13/24, previously 12 FSS: 23/63   BMI:  29.4 kg/m   Neck Circumference: NA    FINDINGS:   Sleep Summary:   Total Recording Time (hours, min): 7 hours 40 minutes      Total Sleep Time (hours, min):       6 hours 46 minutes          Percent REM (%):     27%                                   Respiratory Indices:   Calculated pAHI (per hour):    Following CMS criteria, this patient has a total AHI of 32.2/h                         REM pAHI:      49.2/h following CMS criteria.                                           NREM pAHI:    25.5/h following CMS criteria.                          Positional AHI:    The patient  slept almost exclusively in supine position for 393 minutes with an AHI of 31.7/h there were only about 14 minutes of sleep recorded while in left lateral position and here the AHI was higher 35.6/h.  Snoring reached a mean volume of 40 dB and this is the threshold of detection for this home sleep test device.  Snoring was present for 12.7% of total sleep time less than 1 hour total.  Oxygen Saturation Statistics:   Oxygen Saturation (%) Mean:       90%        Minimum oxygen saturation (%):     58%!       O2 Saturation Range (%):     Between 58 and 97%                                  O2 Saturation (minutes) <89%:    60 minutes,  the equivalent of 15% of total sleep time! O2 saturation in minutes under 90% was 94 minutes and 23% of total sleep time.      Pulse Rate Statistics:   Pulse Mean (bpm):     73 bpm            Pulse Range:    Between 56 and 98 bpm             IMPRESSION:  This HST confirms the presence of all obstructive severe sleep apnea and associated with a REM sleep dependent hypoxia. This pattern can be seen in obesity hypoventilation, and COPD, and also in patients with diaphragmatic weakness.   RECOMMENDATION:  The associated hypoxia for this BiPAP dependent patient is so severe that I would like for her to return to the sleep lab to be titrated to BiPAP and possibly additional oxygen.  This titration to oxygen cannot take place in an ambulatory setting and has to be done while under positive airway pressure therapy.  I will order this follow-up study for the patient today.  Should her current device no longer work, I would be happy for Shawnie Dapper, NP,  to replace the BiPAP with a new BiPAP at similar settings but still want the patient to be evaluated in the sleep lab for possible hypoxemia and oxygen titration.    INTERPRETING PHYSICIAN:   Melvyn Novas, MD   CC Amy Lomax, NP   .ccHST     Graphic attached under  MEDIA.  In lab sleep study was ordered.

## 2023-03-23 NOTE — Telephone Encounter (Signed)
 [facility]   MRN:  [id number]    Holly Acosta Female, 68 y.o., 07/29/1955   HOME SLEEP TEST REPORT ( by Watch PAT)   STUDY DATE:  03-18-2023  ORDERING CLINICIAN:  Shawnie Dapper, NP  REFERRING CLINICIAN: Everhart, Kristie, DO    CLINICAL INFORMATION/HISTORY:  Patient is a current BIPAP user, dx with OSA,  there is a history of DM, HTN, Left PICA infarct. Myocardial infarction . Pneumonia COVID 19 in January 2021. Pt is here Alone. Pt states that her machine says "error" and then shut it off. Pt states that she took her machine to DME and they stated that she needed to see her doctor for a [employer].       HISTORY OF PRESENT ILLNESS:   12/03/22 ALL: Holly Acosta returns for follow up for OSA on BiPAP. She was last seen 11/2021. Compliance had improved but she was having trouble with increased air leak. Since, she reports doing well. She has an older machine and is receiving an error message on her machine. She has not used her machine for the past week due to this message. She reports that her machine will turn off spontaneously. She is eligible for a new machine in 2020.   PS :  AHI was 4.2/h and uses FFM with high air-leakage. EPAP was 18 and IPAP 21 cm water pressure.      Epworth sleepiness score: ESS: 13/24, previously 12 FSS: 23/63   BMI:  29.4 kg/m   Neck Circumference: NA    FINDINGS:   Sleep Summary:   Total Recording Time (hours, min): 7 hours 40 minutes      Total Sleep Time (hours, min):       6 hours 46 minutes          Percent REM (%):     27%                                   Respiratory Indices:   Calculated pAHI (per hour):    Following CMS criteria, this patient has a total AHI of 32.2/h                         REM pAHI:      49.2/h following CMS criteria.                                           NREM pAHI:    25.5/h following CMS criteria.                          Positional AHI:    The patient slept almost exclusively in  supine position for 393 minutes with an AHI of 31.7/h there were only about 14 minutes of sleep recorded while in left lateral position and here the AHI was higher 35.6/h.   Snoring reached a mean volume of 40 dB and this is the threshold of detection for this home sleep test device.  Snoring was present for 12.7% of total sleep time less than 1 hour total.  Oxygen Saturation Statistics:   Oxygen Saturation (%) Mean:       90%        Minimum oxygen saturation (%):     58%!       O2 Saturation Range (%):     Between 58 and 97%                                  O2 Saturation (minutes) <89%:    60 minutes,  the equivalent of 15% of total sleep time! O2 saturation in minutes under 90% was 94 minutes and 23% of total sleep time.      Pulse Rate Statistics:   Pulse Mean (bpm):     73 bpm            Pulse Range:    Between 56 and 98 bpm             IMPRESSION:  This HST confirms the presence of all obstructive severe sleep apnea and associated with a REM sleep dependent hypoxia. This pattern can be seen in obesity hypoventilation, and COPD, and also in patients with diaphragmatic weakness.   RECOMMENDATION:   The associated hypoxia for this BiPAP dependent patient is so severe that I would like for her to return to the sleep lab to be titrated to BiPAP and possibly additional oxygen.  This titration to oxygen cannot take place in an ambulatory setting and has to be done while under positive airway pressure therapy.  I will order this follow-up study for the patient today.   Should her current device no longer work, I will be happy to replace the BiPAP with an at BiPAP at similar settings but still want the patient to be evaluated in the sleep lab for possible hypoxemia and oxygen titration.     INTERPRETING PHYSICIAN:    Melvyn Novas, MD    CC Shawnie Dapper, NP

## 2023-03-24 ENCOUNTER — Telehealth: Payer: Self-pay

## 2023-03-24 DIAGNOSIS — G4733 Obstructive sleep apnea (adult) (pediatric): Secondary | ICD-10-CM

## 2023-03-24 NOTE — Telephone Encounter (Signed)
-----   Message from Amy Lomax sent at 03/24/2023  8:09 AM EST ----- Please let Mrs Vallely know that Dr Vickey Huger was concerned about her oxygen levels with most recent sleep study and wants her to come in for a titration study to evaluate BiPAP pressures and possible need for O2. She placed orders and the Mrs Hepburn should hear back soon to schedule. She should continue use of BiPAP for now and let us know if she needs anything.

## 2023-03-24 NOTE — Telephone Encounter (Signed)
 Patient called.  Patient aware.pt stated that she hasn't been using machine since its been stopping on her and she got frustrated with it. She would like a new machine

## 2023-03-24 NOTE — Telephone Encounter (Signed)
 Confirmed dme got the order:

## 2023-03-24 NOTE — Telephone Encounter (Signed)
 I called and spoke to pt that orders for machine placed.:  Community msg sent to [employer] to inform them of new orders and issues. Routing this to sleep lab to get scheduled

## 2023-03-27 ENCOUNTER — Encounter: Payer: Self-pay | Admitting: Pharmacist

## 2023-03-27 ENCOUNTER — Ambulatory Visit: Payer: 59 | Admitting: Pharmacist

## 2023-03-27 ENCOUNTER — Other Ambulatory Visit: Payer: Self-pay

## 2023-03-27 VITALS — BP 128/67 | HR 80 | Wt 173.6 lb

## 2023-03-27 DIAGNOSIS — E1159 Type 2 diabetes mellitus with other circulatory complications: Secondary | ICD-10-CM | POA: Diagnosis not present

## 2023-03-27 DIAGNOSIS — Z794 Long term (current) use of insulin: Secondary | ICD-10-CM

## 2023-03-27 DIAGNOSIS — I152 Hypertension secondary to endocrine disorders: Secondary | ICD-10-CM | POA: Diagnosis not present

## 2023-03-27 DIAGNOSIS — E119 Type 2 diabetes mellitus without complications: Secondary | ICD-10-CM

## 2023-03-27 DIAGNOSIS — E1165 Type 2 diabetes mellitus with hyperglycemia: Secondary | ICD-10-CM

## 2023-03-27 DIAGNOSIS — E785 Hyperlipidemia, unspecified: Secondary | ICD-10-CM | POA: Diagnosis not present

## 2023-03-27 DIAGNOSIS — E1169 Type 2 diabetes mellitus with other specified complication: Secondary | ICD-10-CM

## 2023-03-27 MED ORDER — INSULIN LISPRO (1 UNIT DIAL) 100 UNIT/ML (KWIKPEN)
14.0000 [IU] | PEN_INJECTOR | SUBCUTANEOUS | 3 refills | Status: DC
Start: 2023-03-27 — End: 2023-09-10

## 2023-03-27 MED ORDER — LANTUS SOLOSTAR 100 UNIT/ML ~~LOC~~ SOPN: 28.0000 [IU] | PEN_INJECTOR | Freq: Every day | SUBCUTANEOUS | Status: DC

## 2023-03-27 NOTE — Assessment & Plan Note (Signed)
 Hypertension currently controlled. Blood pressure goal of <130/80 mmHg. Reports adherence to HTN regimen.  -Continue amlodipine 10 mg daily -Continue olmesartan/hydrochlorothiazide 20/12.5 mg daily -Consider switch to combination triple therapy at next appointment. Patient picked up olmesartan/hydrochlorothiazide prior to today's visit.

## 2023-03-27 NOTE — Progress Notes (Signed)
 S:     Chief Complaint  Patient presents with   Medication Management    Diabetes Hypertension   68 y.o. female who presents for diabetes evaluation, education, and management. Patient arrives in good spirits and presents with assistance rolling walker/seat.   Patient was referred and last seen by Primary Care Provider, Dr. Dalene Seltzer, on 03/27/2023.   PMH is significant for T2DM, HTN, HLD, CKD, difficulty hearing.   At last visit, olmesartan/hydrochlorothiazide 20/12.5 daily mg was substituted for lisinopril which patient has discontinued.  Lantus (insulin glargine) was changed from 34 to 28 units daily, Farxiga (dapagliflozin) 10 mg daily was HELD.  Patient reports Diabetes was diagnosed 5+ years ago.   Current diabetes medications include: Humalog (insulin lispro) 14-15 units bid-tid, Lantus (insulin glargine) 34 units, metformin 1000 mg bid Current hypertension medications include: olmesartan/hydrochlorothiazide 20/12.5 mg daily, amlodipine 10 mg daily, carvedilol 12.5 mg bid Current hyperlipidemia medications include: Repatha (evolocumab) 140 mg every 14 days, ezetimibe 10 mg daily  Patient reports adherence to taking all medications as prescribed.   Insurance coverage: UHC dual eligible  Patient denies hypoglycemic events.  Patient reported dietary habits: Eats 3 meals/day  O:   Review of Systems  HENT:  Positive for hearing loss.   Musculoskeletal:  Positive for joint pain.  All other systems reviewed and are negative.   Physical Exam Vitals reviewed.  Pulmonary:     Effort: Pulmonary effort is normal.  Neurological:     Mental Status: She is alert.  Psychiatric:        Mood and Affect: Mood normal.        Behavior: Behavior normal.    Libre3 CGM Download today 03/10/23-03/23/23 % Time CGM is active: 98% Average Glucose: 211 mg/dL Glucose Management Indicator: 8.4  Glucose Variability: 34.7% (goal <36%) Time in Goal:  - Time in range 70-180: 38% -  Time above range: 60% - Time below range: 2% Observed patterns: Spikes late at night possibly due to taking lower dose of mealtime insulin in the evening   Lab Results  Component Value Date   HGBA1C 12.3 (A) 03/09/2023   Vitals:   03/27/23 0900  BP: 128/67  Pulse: 80  SpO2: 99%    Lipid Panel     Component Value Date/Time   CHOL 206 (H) 08/14/2022 1141   TRIG 404 (H) 08/14/2022 1141   HDL 33 (L) 08/14/2022 1141   CHOLHDL 6.2 (H) 08/14/2022 1141   CHOLHDL 5.1 04/29/2017 0557   VLDL 32 04/29/2017 0557   LDLCALC 104 (H) 08/14/2022 1141   LDLDIRECT 135 (H) 12/31/2012 0949    Clinical Atherosclerotic Cardiovascular Disease (ASCVD): Yes   Patient is participating in a Managed Medicaid Plan:  No  A/P: Diabetes longstanding 5+ years. currently better controlled. Patient is  able to verbalize appropriate hypoglycemia management plan. Medication adherence appears adherent. Patient reported concern about nighttime lows which could explain why her BG was higher towards her last meal of the day (eaten sometime between (9-11 PM) - Continue Lantus(insulin glargine) 28 units daily.  - Adjusted Humalog (insulin lispro) to 18 units with first meal, 18 units with second meal and 14 units with last meal - Continue metformin 1000 mg bid.  - Patient educated on need to reduce late day prandial insulin to avoid low reading over night.  - Counseled on s/sx of and management of hypoglycemia.  - Next A1c anticipated 05/2023.   ASCVD risk - secondary prevention in patient with diabetes with intolerance to  atorvastatin. In July 2024, Last LDL = 104 and not at goal of <70 mg/dL.  - Continue Repatha (evolocumab) 140 mg every 2 weeks  - Continue ezetimibe 10 mg daily - Consider repeat Direct LDL with next lab draw or new Panel in 07/2023.  Hypertension currently controlled. Blood pressure goal of <130/80 mmHg. Reports adherence to HTN regimen.  -Continue amlodipine 10 mg daily -Continue  olmesartan/hydrochlorothiazide 20/12.5 mg daily -Consider switch to combination triple therapy at next appointment. Patient picked up olmesartan/hydrochlorothiazide prior to today's visit.   Written patient instructions provided. Patient verbalized understanding of treatment plan.  Total time in face to face counseling 26 minutes.    Follow-up:  PCP clinic visit 04/03/2023 Patient seen with Threasa Heads, PharmD Candidate and Mack Guise, PharmD Candidate.

## 2023-03-27 NOTE — Patient Instructions (Signed)
 It was nice to see you today! Keep up the good work with your blood sugar  Your goal blood sugar is 80-130 before eating and less than 180 after eating.  Medication Changes:  Change Humalog (insulin lispro) to 18 units with first meal, 18 units with second meal and 14 units with last meal of the day  Continue all other medication the same.    Keep up the good work with diet and exercise. Aim for a diet full of vegetables, fruit and lean meats (chicken, [place], fish). Try to limit salt intake by eating fresh or frozen vegetables (instead of canned), rinse canned vegetables prior to cooking and do not add any additional salt to meals.

## 2023-03-27 NOTE — Assessment & Plan Note (Signed)
 Diabetes longstanding 5+ years. currently better controlled. Patient is  able to verbalize appropriate hypoglycemia management plan. Medication adherence appears adherent. Patient reported concern about nighttime lows which could explain why her BG was higher towards her last meal of the day (eaten sometime between (9-11 PM) - Continue Lantus(insulin glargine) 28 units daily.  - Adjusted Humalog (insulin lispro) to 18 units with first meal, 18 units with second meal and 14 units with last meal - Continue metformin 1000 mg bid.  - Patient educated on need to reduce late day prandial insulin to avoid low reading over night.  - Counseled on s/sx of and management of hypoglycemia.  - Next A1c anticipated 05/2023.

## 2023-03-27 NOTE — Assessment & Plan Note (Signed)
 ASCVD risk - secondary prevention in patient with diabetes with intolerance to atorvastatin. In July 2024, Last LDL = 104 and not at goal of <70 mg/dL.  - Continue Repatha (evolocumab) 140 mg every 2 weeks  - Continue ezetimibe 10 mg daily - Consider repeat Direct LDL with next lab draw or new Panel in 07/2023.

## 2023-03-28 LAB — BASIC METABOLIC PANEL
BUN/Creatinine Ratio: 14 (ref 12–28)
BUN: 16 mg/dL (ref 8–27)
CO2: 23 mmol/L (ref 20–29)
Calcium: 9.7 mg/dL (ref 8.7–10.3)
Chloride: 97 mmol/L (ref 96–106)
Creatinine, Ser: 1.13 mg/dL — ABNORMAL HIGH (ref 0.57–1.00)
Glucose: 155 mg/dL — ABNORMAL HIGH (ref 70–99)
Potassium: 3.9 mmol/L (ref 3.5–5.2)
Sodium: 137 mmol/L (ref 134–144)
eGFR: 53 mL/min/{1.73_m2} — ABNORMAL LOW (ref >59)

## 2023-03-29 NOTE — Progress Notes (Signed)
 Slight bump in Cr after arb initiation Repeat bmp at next appt 3/14 with Dr. Hyacinth Meeker

## 2023-03-30 NOTE — Telephone Encounter (Signed)
 BIPAP UHC medicare/medicaid no auth req   Patient is scheduled at [facility] for 04/21/23 at 8 pm.  Mailed packet to the patient.

## 2023-03-30 NOTE — Progress Notes (Signed)
 Reviewed and agree with Dr Macky Lower plan.

## 2023-04-03 ENCOUNTER — Encounter: Payer: Self-pay | Admitting: Family Medicine

## 2023-04-03 ENCOUNTER — Ambulatory Visit (INDEPENDENT_AMBULATORY_CARE_PROVIDER_SITE_OTHER): Payer: Self-pay | Admitting: Family Medicine

## 2023-04-03 VITALS — BP 128/68 | HR 73 | Ht 64.0 in | Wt 177.4 lb

## 2023-04-03 DIAGNOSIS — E1159 Type 2 diabetes mellitus with other circulatory complications: Secondary | ICD-10-CM | POA: Diagnosis not present

## 2023-04-03 DIAGNOSIS — I152 Hypertension secondary to endocrine disorders: Secondary | ICD-10-CM | POA: Diagnosis not present

## 2023-04-03 NOTE — Assessment & Plan Note (Addendum)
 Recheck BMP today. Blood pressure appropriately controlled, no changes to dosing at this time. Recommended vitamin K2 supplement for cramping, and will review results of BMP and magnesium for any further necessary supplementation. Swelling is likely fluid related, low suspicion for clot at this time given equal leg circumference and lack of erythema, pain or palpable cord.

## 2023-04-03 NOTE — Patient Instructions (Signed)
 It was wonderful to see you today!  Today we did a check on your blood pressure and followed up to see how you were doing. Because you are having some leg cramps, I did not change your medications at this visit. One of your medications could be causing your cramps, so we will check your electrolytes today and if they are low we can give you a supplement.  In the meantime there is a vitamin called vitamin K2 that you can get at most drug stores that can sometimes help with nighttime leg cramps.  You will take 180 mcg nightly if you choose to try this.  You should follow-up with Dr. Rexene Alberts in 4 weeks to recheck your blood pressure and make sure there are no other problems with your medication.  Please call [phone] with any questions about today's appointment.   If you need any additional refills, please call your pharmacy before calling the office.  Gerrit Heck, DO Family Medicine

## 2023-04-03 NOTE — Progress Notes (Signed)
    SUBJECTIVE:   CHIEF COMPLAINT / HPI:   Blood pressure follow up. Had a slight Cr bump after starting an ARB, will recheck today per PCP request.   Patient is doing well, but has been having some intermittent swelling and leg cramps after starting the new medicine. The cramps happen primarily at night and resolve on their own. The swelling is painless and intermittent, and mostly effects her L leg.   PERTINENT  PMH / PSH: Hypertension  OBJECTIVE:   BP 128/68   Pulse 73   Ht 5\' 4"  (1.626 m)   Wt 177 lb 6.4 oz (80.5 kg)   SpO2 98%   BMI 30.45 kg/m   General: A&O, NAD HEENT: No sign of trauma, EOM grossly intact Cardiac: RRR, no m/r/g Respiratory: CTAB, normal WOB, no w/c/r  Extremities: NTTP, trace bilateral pitting edema, calf circumference 37 cm on the L, 36.9 cm on the R. No palpable cord.  ASSESSMENT/PLAN:   Hypertension associated with diabetes (HCC) Recheck BMP today. Blood pressure appropriately controlled, no changes to dosing at this time. Recommended vitamin K2 supplement for cramping, and will review results of BMP and magnesium for any further necessary supplementation. Swelling is likely fluid related, low suspicion for clot at this time given equal leg circumference and lack of erythema, pain or palpable cord.    Gerrit Heck, DO [facility]

## 2023-04-04 LAB — BASIC METABOLIC PANEL
BUN/Creatinine Ratio: 17 (ref 12–28)
BUN: 20 mg/dL (ref 8–27)
CO2: 27 mmol/L (ref 20–29)
Calcium: 9.7 mg/dL (ref 8.7–10.3)
Chloride: 96 mmol/L (ref 96–106)
Creatinine, Ser: 1.21 mg/dL — ABNORMAL HIGH (ref 0.57–1.00)
Glucose: 312 mg/dL — ABNORMAL HIGH (ref 70–99)
Potassium: 4.9 mmol/L (ref 3.5–5.2)
Sodium: 137 mmol/L (ref 134–144)
eGFR: 49 mL/min/{1.73_m2} — ABNORMAL LOW (ref >59)

## 2023-04-04 LAB — MAGNESIUM: Magnesium: 1.9 mg/dL (ref 1.6–2.3)

## 2023-04-07 ENCOUNTER — Other Ambulatory Visit: Payer: Self-pay | Admitting: General Practice

## 2023-04-07 ENCOUNTER — Ambulatory Visit: Payer: Self-pay

## 2023-04-07 DIAGNOSIS — G4733 Obstructive sleep apnea (adult) (pediatric): Secondary | ICD-10-CM | POA: Diagnosis not present

## 2023-04-07 DIAGNOSIS — E1169 Type 2 diabetes mellitus with other specified complication: Secondary | ICD-10-CM

## 2023-04-07 NOTE — Patient Instructions (Signed)
 Visit Information  Thank you for taking time to visit with me today. Please don't hesitate to contact me if I can be of assistance to you.   Following are the goals we discussed today:   Goals Addressed             This Visit's Progress    Diabetes Management       Care Coordination Interventions: Provided education to patient about basic DM disease process Reviewed medications with patient and discussed importance of medication adherence Discussed plans with patient for ongoing care management follow up and provided patient with direct contact information for care management team  Patient reports she is doing okay. Patient state she is taking her insulin regularly and MD recently changed medication.   She states her sugar this morning was 191.  Discussed with patient importance of eating regularly and taking insulin regularly to regulate sugars.  Discussed consequences of uncontrolled diabetes.  She verbalized understanding.  She is to see PCP again next month to follow up on diabetes management.  Encouraged patient on better diabetes management.  No concerns.          Your next appointment is by telephone on 04/28/23 at 1030 am  Please call the care guide team at [phone] if you need to cancel or reschedule your appointment.   If you are experiencing a Mental Health or Behavioral Health Crisis or need someone to talk to, please call the Suicide and Crisis Lifeline: 988   The patient verbalized understanding of instructions, educational materials, and care plan provided today and DECLINED offer to receive copy of patient instructions, educational materials, and care plan.   The patient has been provided with contact information for the care management team and has been advised to call with any health related questions or concerns.   Bary Leriche RN, MSN [facility], [facility] RN Care Manager Direct Dial: [phone]  Fax:  [phone] Website: [email or web address]

## 2023-04-07 NOTE — Patient Outreach (Signed)
 Care Coordination   Follow Up Visit Note   04/07/2023 Name: Holly Acosta MRN: [id number] DOB: Sep 29, 1955  Holly Acosta is a 68 y.o. year old female who sees Everhart, Kirstie, DO for primary care. I spoke with  Holly Acosta by phone today.  What matters to the patients health and wellness today?  Better diabetes management    Goals Addressed             This Visit's Progress    Diabetes Management       Care Coordination Interventions: Provided education to patient about basic DM disease process Reviewed medications with patient and discussed importance of medication adherence Discussed plans with patient for ongoing care management follow up and provided patient with direct contact information for care management team  Patient reports she is doing okay. Patient state she is taking her insulin regularly and MD recently changed medication.   She states her sugar this morning was 191.  Discussed with patient importance of eating regularly and taking insulin regularly to regulate sugars.  Discussed consequences of uncontrolled diabetes.  She verbalized understanding.  She is to see PCP again next month to follow up on diabetes management.  Encouraged patient on better diabetes management.  No concerns.          SDOH assessments and interventions completed:  Yes     Care Coordination Interventions:  Yes, provided   Follow up plan: Follow up call scheduled for April    Encounter Outcome:  Patient Visit Completed   Bary Leriche RN, MSN [facility], [facility] RN Care Manager Direct Dial: [phone]  Fax: [phone] Website: [email or web address]

## 2023-04-12 ENCOUNTER — Other Ambulatory Visit: Payer: Self-pay | Admitting: Family Medicine

## 2023-04-13 ENCOUNTER — Ambulatory Visit (AMBULATORY_SURGERY_CENTER): Payer: 59 | Admitting: Gastroenterology

## 2023-04-13 ENCOUNTER — Encounter: Payer: Self-pay | Admitting: Gastroenterology

## 2023-04-13 VITALS — BP 158/77 | HR 69 | Temp 97.7°F | Resp 15 | Ht 64.0 in | Wt 171.0 lb

## 2023-04-13 DIAGNOSIS — K644 Residual hemorrhoidal skin tags: Secondary | ICD-10-CM

## 2023-04-13 DIAGNOSIS — Z1211 Encounter for screening for malignant neoplasm of colon: Secondary | ICD-10-CM | POA: Diagnosis not present

## 2023-04-13 DIAGNOSIS — K648 Other hemorrhoids: Secondary | ICD-10-CM

## 2023-04-13 DIAGNOSIS — G4730 Sleep apnea, unspecified: Secondary | ICD-10-CM | POA: Diagnosis not present

## 2023-04-13 MED ORDER — SODIUM CHLORIDE 0.9 % IV SOLN: 500.0000 mL | Freq: Once | INTRAVENOUS | Status: DC

## 2023-04-13 NOTE — Progress Notes (Signed)
 Pt's states no medical or surgical changes since previsit or office visit.

## 2023-04-13 NOTE — Op Note (Signed)
 [facility] Patient Name: Holly Acosta Procedure Date: 04/13/2023 4:05 PM MRN: [id number] Endoscopist: Napoleon Form , MD, [id number] Age: 68 Referring MD:  Date of Birth: 30-Oct-1955 Gender: Female Account #: [id number] Procedure:                Colonoscopy Indications:              Screening for colorectal malignant neoplasm Medicines:                Monitored Anesthesia Care Procedure:                Pre-Anesthesia Assessment:                           - Prior to the procedure, a History and Physical                            was performed, and patient medications and                            allergies were reviewed. The patient's tolerance of                            previous anesthesia was also reviewed. The risks                            and benefits of the procedure and the sedation                            options and risks were discussed with the patient.                            All questions were answered, and informed consent                            was obtained. Prior Anticoagulants: The patient has                            taken no anticoagulant or antiplatelet agents. ASA                            Grade Assessment: III - A patient with severe                            systemic disease. After reviewing the risks and                            benefits, the patient was deemed in satisfactory                            condition to undergo the procedure.                           After obtaining informed consent, the colonoscope  was passed under direct vision. Throughout the                            procedure, the patient's blood pressure, pulse, and                            oxygen saturations were monitored continuously. The                            Olympus Scope SN: [id number] was introduced through                            the anus and advanced to the the cecum, identified                            by  appendiceal orifice and ileocecal valve. The                            colonoscopy was somewhat difficult due to the                            patient's body habitus. The patient tolerated the                            procedure well. The quality of the bowel                            preparation was fair. The ileocecal valve,                            appendiceal orifice, and rectum were photographed. Scope In: 4:11:45 PM Scope Out: 4:28:58 PM Scope Withdrawal Time: 0 hours 9 minutes 47 seconds  Total Procedure Duration: 0 hours 17 minutes 13 seconds  Findings:                 The perianal and digital rectal examinations were                            normal.                           Non-bleeding external and internal hemorrhoids were                            found during retroflexion. The hemorrhoids were                            medium-sized.                           The exam was otherwise without abnormality in the                            visualized colon, no large mass lesions but may  have missed small polyps or flat lesions. Complications:            No immediate complications. Estimated Blood Loss:     Estimated blood loss was minimal. Impression:               - Preparation of the colon was fair.                           - Non-bleeding external and internal hemorrhoids.                           - The examination was otherwise normal.                           - No specimens collected. Recommendation:           - Patient has a contact number available for                            emergencies. The signs and symptoms of potential                            delayed complications were discussed with the                            patient. Return to normal activities tomorrow.                            Written discharge instructions were provided to the                            patient.                           - Resume previous  diet.                           - Continue present medications.                           - No repeat colonoscopy due to current age (52                            years or older) and co-morbid conditions. Napoleon Form, MD 04/13/2023 4:34:44 PM This report has been signed electronically.

## 2023-04-13 NOTE — Progress Notes (Unsigned)
 To pacu, VSS. Report to [email or web address]

## 2023-04-13 NOTE — Progress Notes (Unsigned)
 [facility] History and Physical   Primary Care Physician:  Para March, DO   Reason for Procedure:  Colorectal cancer screening  Plan:    Screening colonoscopy with possible interventions as needed     HPI: Holly Acosta is a very pleasant 68 y.o. female here for screening colonoscopy. Denies any nausea, vomiting, abdominal pain, melena or bright red blood per rectum  The risks and benefits as well as alternatives of endoscopic procedure(s) have been discussed and reviewed. All questions answered. The patient agrees to proceed.    Past Medical History:  Diagnosis Date   Acute cerebrovascular accident (CVA) of cerebellum (HCC) 04/29/2017   Acute cerebrovascular accident (CVA) of cerebellum (HCC) 04/29/2017   Acute respiratory failure with hypoxia (HCC)    AKI (acute kidney injury) (HCC)    Anemia    Arthritis of knee    "both knees" (04/24/2017)   Cataract    Cerebrovascular accident (CVA) due to occlusion of left posterior cerebral artery (HCC) 12/03/2017   Chest pain 04/24/2017   COLONIC POLYPS, HYPERPLASTIC 09/13/2009   Qualifier: Diagnosis of  By: Delrae Alfred MD, Elizabeth     Constipation 06/02/2017   Coronary artery disease involving native coronary artery of native heart with unstable angina pectoris (HCC) 04/30/2017   Dehydration    Diabetes mellitus, type 2 (HCC)    Excessive daytime sleepiness 12/03/2017   History of blood transfusion    "related to prolapsed uterus"   HLD (hyperlipidemia)    HTN (hypertension)    Non-ST elevation (NSTEMI) myocardial infarction (HCC)    Non-ST elevation (NSTEMI) myocardial infarction (HCC)    Pneumonia due to COVID-19 virus 02/18/2019   Prolapsed uterus    Severe obstructive sleep apnea-hypopnea syndrome 01/01/2018   Sleep apnea    "have mask; haven't worn it for a long time" (04/24/2017)   Status post coronary artery stent placement    Umbilical hernia    Uterine prolapse 01/20/2006   Qualifier: Diagnosis of    By: Delrae Alfred MD, Elmer Sow hernia 01/26/2009   Qualifier: Diagnosis of   By: Delrae Alfred MD, Lanora Manis      Replacing diagnoses that were inactivated after the 04/21/22 regulatory import      Past Surgical History:  Procedure Laterality Date   CARDIAC CATHETERIZATION  2011   COLONOSCOPY     CORONARY STENT INTERVENTION N/A 04/27/2017   Procedure: CORONARY STENT INTERVENTION;  Surgeon: Corky Crafts, MD;  Location: [facility] INVASIVE CV LAB;  Service: Cardiovascular;  Laterality: N/A;   LEFT HEART CATH AND CORONARY ANGIOGRAPHY N/A 04/27/2017   Procedure: LEFT HEART CATH AND CORONARY ANGIOGRAPHY;  Surgeon: Corky Crafts, MD;  Location: [facility] INVASIVE CV LAB;  Service: Cardiovascular;  Laterality: N/A;   TUBAL LIGATION      Prior to Admission medications   Medication Sig Start Date End Date Taking? Authorizing Provider  amLODipine (NORVASC) 10 MG tablet Take 1 tablet (10 mg total) by mouth at bedtime. 03/09/23   Everhart, Kirstie, DO  aspirin EC 81 MG tablet Take 1 tablet (81 mg total) by mouth daily. 03/30/14   Jamal Collin, MD  carvedilol (COREG) 12.5 MG tablet Take 1 tablet (12.5 mg total) by mouth 2 (two) times daily with a meal. 09/23/22   Everhart, Kirstie, DO  Continuous Glucose Sensor (FREESTYLE LIBRE 3 SENSOR) MISC Place 1 sensor on the skin every 14 days. Use to check glucose continuously 11/17/22   Everhart, Kirstie, DO  cyanocobalamin ([facility]  VITAMIN B12) 1000 MCG tablet Take 1 tablet (1,000 mcg total) by mouth daily. 12/22/22   Everhart, Kirstie, DO  Evolocumab (REPATHA SURECLICK) 140 MG/ML SOAJ INJECT 140 MG INTO THE SKIN EVERY 14 (FOURTEEN) DAYS. 12/03/22   [place], Peter M, MD  ezetimibe (ZETIA) 10 MG tablet TAKE 1 TABLET BY MOUTH EVERY DAY 04/09/23   Ronney Asters, NP  ferrous sulfate 325 (65 FE) MG tablet TAKE 1 TABLET BY MOUTH TWICE A DAY WITH FOOD 07/14/22   Ganta, Anupa, DO  insulin glargine (LANTUS SOLOSTAR) 100 UNIT/ML Solostar Pen Inject 28 Units into the  skin daily. INJECT 34 UNITS INTO SKIN DAILY 03/27/23   McDiarmid, Leighton Roach, MD  insulin lispro (HUMALOG KWIKPEN) 100 UNIT/ML KwikPen Inject 14-18 Units into the skin See admin instructions. Inject 18 units with first two meals of the day and inject 14 units with the last meal of the day. 03/27/23   McDiarmid, Leighton Roach, MD  metFORMIN (GLUCOPHAGE) 1000 MG tablet TAKE 1 TABLET (1,000 MG TOTAL) BY MOUTH TWICE A DAY WITH FOOD 03/17/22   Robyne Peers, Anupa, DO  olmesartan-hydrochlorothiazide (BENICAR HCT) 20-12.5 MG tablet TAKE 1 TABLET BY MOUTH EVERY DAY 04/13/23   Everhart, Kirstie, DO  Omega-3 Fatty Acids (FISH OIL) 1000 MG CAPS Take 1,000 mg by mouth daily.    [provider]  PEG 3350-KCl-NaCl-NaSulf-MgSul (SUFLAVE) 178.7 g SOLR Take 1 kit by mouth as directed. Patient not taking: Reported on 03/27/2023 02/11/23   Napoleon Form, MD    Current Outpatient Medications  Medication Sig Dispense Refill   amLODipine (NORVASC) 10 MG tablet Take 1 tablet (10 mg total) by mouth at bedtime. 90 tablet 3   aspirin EC 81 MG tablet Take 1 tablet (81 mg total) by mouth daily. 90 tablet 3   carvedilol (COREG) 12.5 MG tablet Take 1 tablet (12.5 mg total) by mouth 2 (two) times daily with a meal. 180 tablet 3   Continuous Glucose Sensor (FREESTYLE LIBRE 3 SENSOR) MISC Place 1 sensor on the skin every 14 days. Use to check glucose continuously 2 each 11   cyanocobalamin (CVS VITAMIN B12) 1000 MCG tablet Take 1 tablet (1,000 mcg total) by mouth daily. 90 tablet 1   Evolocumab (REPATHA SURECLICK) 140 MG/ML SOAJ INJECT 140 MG INTO THE SKIN EVERY 14 (FOURTEEN) DAYS. 6 mL 1   ezetimibe (ZETIA) 10 MG tablet TAKE 1 TABLET BY MOUTH EVERY DAY 90 tablet 3   ferrous sulfate 325 (65 FE) MG tablet TAKE 1 TABLET BY MOUTH TWICE A DAY WITH FOOD 180 tablet 0   insulin glargine (LANTUS SOLOSTAR) 100 UNIT/ML Solostar Pen Inject 28 Units into the skin daily. INJECT 34 UNITS INTO SKIN DAILY     insulin lispro (HUMALOG KWIKPEN) 100 UNIT/ML  KwikPen Inject 14-18 Units into the skin See admin instructions. Inject 18 units with first two meals of the day and inject 14 units with the last meal of the day. 15 mL 3   metFORMIN (GLUCOPHAGE) 1000 MG tablet TAKE 1 TABLET (1,000 MG TOTAL) BY MOUTH TWICE A DAY WITH FOOD 180 tablet 3   olmesartan-hydrochlorothiazide (BENICAR HCT) 20-12.5 MG tablet TAKE 1 TABLET BY MOUTH EVERY DAY 90 tablet 3   Omega-3 Fatty Acids (FISH OIL) 1000 MG CAPS Take 1,000 mg by mouth daily.     PEG 3350-KCl-NaCl-NaSulf-MgSul (SUFLAVE) 178.7 g SOLR Take 1 kit by mouth as directed. (Patient not taking: Reported on 03/27/2023) 1 each 0   Current Facility-Administered Medications  Medication Dose Route Frequency  Provider Last Rate Last Admin   0.9 %  sodium chloride infusion  500 mL Intravenous Once Napoleon Form, MD        Allergies as of 04/13/2023 - Review Complete 04/03/2023  Allergen Reaction Noted   Lipitor [atorvastatin] Other (See Comments) 09/12/2013   Diclofenac Nausea Only and Other (See Comments) 12/11/2011    Family History  Problem Relation Age of Onset   Diabetes Mother    Hypertension Mother    Hypertension Father    Colon cancer Neg Hx    Colon polyps Neg Hx    Esophageal cancer Neg Hx    Stomach cancer Neg Hx    Rectal cancer Neg Hx     Social History   Socioeconomic History   Marital status: Divorced    Spouse name: Not on file   Number of children: Not on file   Years of education: Not on file   Highest education level: Not on file  Occupational History   Not on file  Tobacco Use   Smoking status: Former    Current packs/day: 0.00    Average packs/day: 1 pack/day for 33.8 years (33.8 ttl pk-yrs)    Types: Cigarettes    Start date: 01/20/1973    Quit date: 11/04/2006    Years since quitting: 16.4   Smokeless tobacco: Never  Vaping Use   Vaping status: Never Used  Substance and Sexual Activity   Alcohol use: Never    Alcohol/week: 0.0 standard drinks of alcohol   Drug  use: Never   Sexual activity: Not Currently  Other Topics Concern   Not on file  Social History Narrative   Lives with daughter Toniann Fail age 63) and 4 grandchildren.   Disability - Arthritis. Does get disability check.   Does not have a living will yet   Social Drivers of Corporate investment banker Strain: Low Risk  (07/25/2022)   Overall Financial Resource Strain (CARDIA)    Difficulty of Paying Living Expenses: Not hard at all  Food Insecurity: No Food Insecurity (02/04/2023)   Hunger Vital Sign    Worried About Running Out of Food in the Last Year: Never true    Ran Out of Food in the Last Year: Never true  Transportation Needs: No Transportation Needs (02/04/2023)   PRAPARE - Administrator, Civil Service (Medical): No    Lack of Transportation (Non-Medical): No  Physical Activity: Inactive (07/25/2022)   Exercise Vital Sign    Days of Exercise per Week: 0 days    Minutes of Exercise per Session: 0 min  Stress: No Stress Concern Present (07/25/2022)   [employer] of Occupational Health - Occupational Stress Questionnaire    Feeling of Stress : Not at all  Social Connections: Moderately Isolated (07/25/2022)   Social Connection and Isolation Panel [NHANES]    Frequency of Communication with Friends and Family: More than three times a week    Frequency of Social Gatherings with Friends and Family: Three times a week    Attends Religious Services: More than 4 times per year    Active Member of Clubs or Organizations: No    Attends Banker Meetings: Never    Marital Status: Divorced  Catering manager Violence: Not At Risk (07/25/2022)   Humiliation, Afraid, Rape, and Kick questionnaire    Fear of Current or Ex-Partner: No    Emotionally Abused: No    Physically Abused: No    Sexually Abused: No  Review of Systems:  All other review of systems negative except as mentioned in the HPI.  Physical Exam: Vital signs in last 24 hours: BP 139/77   Pulse  67   Temp 97.7 F (36.5 C)   Ht 5\' 4"  (1.626 m)   Wt 171 lb (77.6 kg)   SpO2 96%   BMI 29.35 kg/m  General:   Alert, NAD Lungs:  Clear .   Heart:  Regular rate and rhythm Abdomen:  Soft, nontender and nondistended. Neuro/Psych:  Alert and cooperative. Normal mood and affect. A and O x 3  Reviewed labs, radiology imaging, old records and pertinent past GI work up  Patient is appropriate for planned procedure(s) and anesthesia in an ambulatory setting   K. Scherry Ran , MD [phone]

## 2023-04-13 NOTE — Patient Instructions (Addendum)
 Resume previous diet.                           - Continue present medications.                           - No repeat colonoscopy due to current age (14                            years or older) and co-morbid conditions.     YOU HAD AN ENDOSCOPIC PROCEDURE TODAY AT THE [facility]:   Refer to the procedure report that was given to you for any specific questions about what was found during the examination.  If the procedure report does not answer your questions, please call your gastroenterologist to clarify.  If you requested that your care partner not be given the details of your procedure findings, then the procedure report has been included in a sealed envelope for you to review at your convenience later.  YOU SHOULD EXPECT: Some feelings of bloating in the abdomen. Passage of more gas than usual.  Walking can help get rid of the air that was put into your GI tract during the procedure and reduce the bloating. If you had a lower endoscopy (such as a colonoscopy or flexible sigmoidoscopy) you may notice spotting of blood in your stool or on the toilet paper. If you underwent a bowel prep for your procedure, you may not have a normal bowel movement for a few days.  Please Note:  You might notice some irritation and congestion in your nose or some drainage.  This is from the oxygen used during your procedure.  There is no need for concern and it should clear up in a day or so.  SYMPTOMS TO REPORT IMMEDIATELY:  Following lower endoscopy (colonoscopy or flexible sigmoidoscopy):  Excessive amounts of blood in the stool  Significant tenderness or worsening of abdominal pains  Swelling of the abdomen that is new, acute  Fever of 100F or higher   For urgent or emergent issues, a gastroenterologist can be reached at any hour by calling [phone]. Do not use MyChart messaging for urgent concerns.    DIET:  We do recommend a small meal at first, but then you may proceed to your  regular diet.  Drink plenty of fluids but you should avoid alcoholic beverages for 24 hours.  ACTIVITY:  You should plan to take it easy for the rest of today and you should NOT DRIVE or use heavy machinery until tomorrow (because of the sedation medicines used during the test).    FOLLOW UP: Our staff will call the number listed on your records the next business day following your procedure.  We will call around 7:15- 8:00 am to check on you and address any questions or concerns that you may have regarding the information given to you following your procedure. If we do not reach you, we will leave a message.     If any biopsies were taken you will be contacted by phone or by letter within the next 1-3 weeks.  Please call us at [phone] if you have not heard about the biopsies in 3 weeks.    SIGNATURES/CONFIDENTIALITY: You and/or your care partner have signed paperwork which will be entered into your electronic medical record.  These signatures attest to the fact that  that the information above on your After Visit Summary has been reviewed and is understood.  Full responsibility of the confidentiality of this discharge information lies with you and/or your care-partner.

## 2023-04-14 ENCOUNTER — Telehealth: Payer: Self-pay

## 2023-04-14 NOTE — Telephone Encounter (Signed)
  Follow up Call-     04/13/2023    3:41 PM  Call back number  Post procedure Call Back phone  # [phone]  Permission to leave phone message Yes     Patient questions:  Do you have a fever, pain , or abdominal swelling? No. Pain Score  0 *  Have you tolerated food without any problems? Yes.    Have you been able to return to your normal activities? Yes.    Do you have any questions about your discharge instructions: Diet   No. Medications  No. Follow up visit  No.  Do you have questions or concerns about your Care? No.  Actions: * If pain score is 4 or above: No action needed, pain <4.

## 2023-04-15 ENCOUNTER — Encounter

## 2023-04-15 ENCOUNTER — Telehealth: Payer: Self-pay | Admitting: Family Medicine

## 2023-04-15 NOTE — Telephone Encounter (Signed)
-----   Message from [facility] sent at 04/15/2023  1:56 PM EDT ----- Regarding: CPAP/BIPAP Hi Holly Acosta,  I am this patient's PCP. I know Holly Acosta sees you all for her BIPAP titration. I have received many many forms regarding her CPAP titrations - I have sent them back as well. Are you all also completing these [employer] forms too? I just want to make sure she is getting her Bipap with the correct settings - I saw your last note.  Thanks so much! Holly Acosta

## 2023-04-15 NOTE — Telephone Encounter (Signed)
 Hey guys! Can you check that all is well with new orders for BiPAP and Holly Acosta has what she needs? I received a note from PCP that they are receiving many documents from DME and they wanted to make sure we knew. Let me know if there are any issues I need to address. She is scheduled for follow up next week.

## 2023-04-16 NOTE — Telephone Encounter (Signed)
 Simone sent community message to DME to get update on this. Waiting on response.

## 2023-04-16 NOTE — Progress Notes (Signed)
 Cardiology Clinic Note   Patient Name: Holly Acosta Date of Encounter: 04/20/2023  Primary Care Provider:  Para March, DO Primary Cardiologist:  Peter [place]az[place], MD  Patient Profile    Holly Acosta 68 year old female presents the clinic today for follow-up evaluation of her coronary artery disease and hypertension.  Past Medical History    Past Medical History:  Diagnosis Date   Acute cerebrovascular accident (CVA) of cerebellum (HCC) 04/29/2017   Acute cerebrovascular accident (CVA) of cerebellum (HCC) 04/29/2017   Acute respiratory failure with hypoxia (HCC)    AKI (acute kidney injury) (HCC)    Anemia    Arthritis of knee    "both knees" (04/24/2017)   Cataract    Cerebrovascular accident (CVA) due to occlusion of left posterior cerebral artery (HCC) 12/03/2017   Chest pain 04/24/2017   COLONIC POLYPS, HYPERPLASTIC 09/13/2009   Qualifier: Diagnosis of  By: Delrae Alfred MD, Elizabeth     Constipation 06/02/2017   Coronary artery disease involving native coronary artery of native heart with unstable angina pectoris (HCC) 04/30/2017   Dehydration    Diabetes mellitus, type 2 (HCC)    Excessive daytime sleepiness 12/03/2017   History of blood transfusion    "related to prolapsed uterus"   HLD (hyperlipidemia)    HTN (hypertension)    Non-ST elevation (NSTEMI) myocardial infarction (HCC)    Non-ST elevation (NSTEMI) myocardial infarction (HCC)    Pneumonia due to COVID-19 virus 02/18/2019   Prolapsed uterus    Severe obstructive sleep apnea-hypopnea syndrome 01/01/2018   Sleep apnea    "have mask; haven't worn it for a long time" (04/24/2017)   Status post coronary artery stent placement    Umbilical hernia    Uterine prolapse 01/20/2006   Qualifier: Diagnosis of   By: Delrae Alfred MD, Elmer Sow hernia 01/26/2009   Qualifier: Diagnosis of   By: Delrae Alfred MD, Lanora Manis      Replacing diagnoses that were inactivated after the 04/21/22 regulatory import      Past Surgical History:  Procedure Laterality Date   CARDIAC CATHETERIZATION  2011   COLONOSCOPY     CORONARY STENT INTERVENTION N/A 04/27/2017   Procedure: CORONARY STENT INTERVENTION;  Surgeon: Corky Crafts, MD;  Location: [facility] INVASIVE CV [facility];  Service: Cardiovascular;  Laterality: N/A;   LEFT HEART CATH AND CORONARY ANGIOGRAPHY N/A 04/27/2017   Procedure: LEFT HEART CATH AND CORONARY ANGIOGRAPHY;  Surgeon: Corky Crafts, MD;  Location: [facility] INVASIVE CV LAB;  Service: Cardiovascular;  Laterality: N/A;   TUBAL LIGATION      Allergies  Allergies  Allergen Reactions   Lipitor [Atorvastatin] Other (See Comments)    Cramping in feet that resolved after stopping med   Diclofenac Nausea Only and Other (See Comments)    Caused a lot of stomach pain     History of Present Illness    Holly Acosta has a PMH of hypertension, coronary artery disease, OSA, HLD, type 2 diabetes, CKD stage IIIa, depression, obesity, and elevated LFTs.  Cardiac catheter cessation in 2019 with PCI and DES to her circumflex.  She was admitted to the hospital on 01/18/2022 and discharged on 01/21/22.  She was admitted with sepsis due to left lower lobe pneumonia.  She received IV antibiotics and was transitioned to oral antibiotics at discharge.  Her respiratory virus panel was negative.  She was noted to have elevated LFTs and her statin therapy was placed on hold.  It was recommended that her statin therapy and ezetimibe be restarted in 1 month after repeat LFTs.  She initially required supplemental oxygen up to 4 L which was weaned as tolerated.  A noncontrast chest CT in 6-12 months was recommended for a lung nodule that was noted on imaging.  She presented to the clinic 02/19/22 for follow-up evaluation and stated she felt fairly well today.  She reported that she did not want to resume statin therapy.  We reviewed her previous cardiac catheterization and need for lipid management.  She and her daughter  expressed understanding.  We shared decision making to discuss strategies for high-fiber diet and lowering cholesterol.  She planned to resume ezetimibe.  I planned to repeat her fasting lipids and LFTs in 8 weeks and at that time decide if she we will need to go to pharmacy lipid clinic for consideration of PCSK9 inhibitor.   follow-up in 4 months was planned.  Repeat liver panel was not completed.  She presented to the clinic 07/17/22 for follow-up evaluation and stated she had difficulties with transportation and was not able to complete her repeat LFTs.  We reviewed her previous clinic visit and she expressed understanding.  I reviewed PCSK9 inhibitors.  She denied episodes of chest discomfort palpitations and shortness of breath.  She continued to walk with a rollator.  She was planning a trip to the beach with her granddaughter and daughter.  I  refered her to the lipid clinic for evaluation for PCSK9 inhibitor.  I planned repeat liver panel and planned follow-up in 6 to 9 months.  She was seen and evaluated by pharmacy lipid clinic.  She was approved for Repatha.  08/15/2022.  She presents to the clinic today for follow-up evaluation and states she has noticed some lower extremity swelling recently.  She reports that she was seen by her PCP who increased her blood pressure medication.  We reviewed her medications.  It appears that her lower extremity swelling may be related to increased amlodipine dose.  She is somewhat physically active but is limited in her activity due to knee pain.  She continues to remain stable from a cardiac standpoint.  I will reduce her amlodipine to 5 mg.  Increase her carvedilol to 18.75 mg twice daily, order fasting lipids and LFTs, give [place] support stocking sheet and plan follow-up in 9 to 12 months.  Today she denies chest pain, shortness of breath, fatigue, palpitations, melena, hematuria, hemoptysis, diaphoresis, weakness, presyncope, syncope, orthopnea, and  PND.     Home Medications    Prior to Admission medications   Medication Sig Start Date End Date Taking? Authorizing Provider  Accu-Chek FastClix Lancets MISC 1 each by Does not apply route daily. Check sugar daily as needed 09/09/21   Ganta, Anupa, DO  acetaminophen (TYLENOL) 500 MG tablet Take 1,000 mg by mouth daily as needed for mild pain.    [provider]  amLODipine (NORVASC) 5 MG tablet TAKE 1 TABLET BY MOUTH EVERY DAY Patient taking differently: Take 5 mg by mouth daily. 07/17/21   [place], Peter M, MD  aspirin EC 81 MG tablet Take 1 tablet (81 mg total) by mouth daily. 03/30/14   Jamal Collin, MD  B-D UF III MINI PEN NEEDLES 31G X 5 MM MISC USE AS DIRECTED 3 TIMES A DAY 12/23/21   Ganta, Anupa, DO  Blood Glucose Monitoring Suppl (ACCU-CHEK GUIDE ME) w/Device KIT PLEASE USE TO CHECK BLOOD SUGAR UP TO THREE TIMES DAILY. 11/14/21  Ganta, Anupa, DO  carvedilol (COREG) 12.5 MG tablet TAKE 1 TABLET BY MOUTH 2 TIMES DAILY WITH A MEAL. Patient taking differently: Take 12.5 mg by mouth 2 (two) times daily with a meal. 09/13/21   Ganta, Anupa, DO  cyanocobalamin (VITAMIN B12) 1000 MCG tablet Take 1 tablet (1,000 mcg total) by mouth daily. 01/21/22 05/21/22  Kathlen Mody, MD  dapagliflozin propanediol (FARXIGA) 10 MG TABS tablet Take 1 tablet (10 mg total) by mouth daily. 03/25/21   Ganta, Anupa, DO  ezetimibe (ZETIA) 10 MG tablet TAKE 1 TABLET BY MOUTH EVERY DAY Patient taking differently: Take 10 mg by mouth daily. 05/06/21   Ganta, Anupa, DO  ferrous sulfate 325 (65 FE) MG tablet Take 1 tablet (325 mg total) by mouth 2 (two) times daily with a meal. 01/21/22   Kathlen Mody, MD  glucose blood (ACCU-CHEK GUIDE) test strip Please use to check blood sugar up to three times daily. 09/09/21   Ganta, Anupa, DO  guaiFENesin-dextromethorphan (ROBITUSSIN DM) 100-10 MG/5ML syrup Take 5 mLs by mouth every 4 (four) hours as needed for cough. Patient not taking: Reported on 02/13/2022 01/21/22   Kathlen Mody, MD  HUMALOG KWIKPEN 100 UNIT/ML KwikPen INJECT 14 UNITS INTO THE SKIN 3 (THREE) TIMES DAILY. Patient taking differently: Inject 14-15 Units into the skin See admin instructions. Inject 14-15 units 2-3 times a day 07/05/21   Ganta, Anupa, DO  insulin glargine (LANTUS SOLOSTAR) 100 UNIT/ML Solostar Pen INJECT 32 UNITS INTO SKIN DAILY Patient taking differently: Inject 32 Units into the skin daily. 05/22/21   Ganta, Anupa, DO  lisinopril (ZESTRIL) 10 MG tablet Take 1 tablet (10 mg total) by mouth daily. 05/06/21   Reece Leader, DO  metFORMIN (GLUCOPHAGE) 1000 MG tablet Take 1 tablet (1,000 mg total) by mouth 2 (two) times daily with a meal. 03/19/21   Ganta, Anupa, DO  Omega-3 Fatty Acids (FISH OIL) 1000 MG CAPS Take 1,000 mg by mouth daily.    [provider]  rosuvastatin (CRESTOR) 40 MG tablet Take 1 tablet (40 mg total) by mouth daily. Patient not taking: Reported on 02/13/2022 02/20/22   Kathlen Mody, MD  senna-docusate (SENOKOT-S) 8.6-50 MG tablet Take 1 tablet by mouth at bedtime as needed for mild constipation. 01/21/22   Kathlen Mody, MD    Family History    Family History  Problem Relation Age of Onset   Diabetes Mother    Hypertension Mother    Hypertension Father    Colon cancer Neg Hx    Colon polyps Neg Hx    Esophageal cancer Neg Hx    Stomach cancer Neg Hx    Rectal cancer Neg Hx    She indicated that the status of her mother is unknown. She indicated that the status of her father is unknown. She indicated that the status of her neg hx is unknown.  Social History    Social History   Socioeconomic History   Marital status: Divorced    Spouse name: Not on file   Number of children: Not on file   Years of education: Not on file   Highest education level: Not on file  Occupational History   Not on file  Tobacco Use   Smoking status: Former    Current packs/day: 0.00    Average packs/day: 1 pack/day for 33.8 years (33.8 ttl pk-yrs)    Types: Cigarettes     Start date: 01/20/1973    Quit date: 11/04/2006    Years since quitting: 16.4  Smokeless tobacco: Never  Vaping Use   Vaping status: Never Used  Substance and Sexual Activity   Alcohol use: Never    Alcohol/week: 0.0 standard drinks of alcohol   Drug use: Never   Sexual activity: Not Currently  Other Topics Concern   Not on file  Social History Narrative   Lives with daughter Toniann Fail age 75) and 4 grandchildren.   Disability - Arthritis. Does get disability check.   Does not have a living will yet   Social Drivers of Corporate investment banker Strain: Low Risk  (07/25/2022)   Overall Financial Resource Strain (CARDIA)    Difficulty of Paying Living Expenses: Not hard at all  Food Insecurity: No Food Insecurity (02/04/2023)   Hunger Vital Sign    Worried About Running Out of Food in the Last Year: Never true    Ran Out of Food in the Last Year: Never true  Transportation Needs: No Transportation Needs (02/04/2023)   PRAPARE - Administrator, Civil Service (Medical): No    Lack of Transportation (Non-Medical): No  Physical Activity: Inactive (07/25/2022)   Exercise Vital Sign    Days of Exercise per Week: 0 days    Minutes of Exercise per Session: 0 min  Stress: No Stress Concern Present (07/25/2022)   [employer] of Occupational Health - Occupational Stress Questionnaire    Feeling of Stress : Not at all  Social Connections: Moderately Isolated (07/25/2022)   Social Connection and Isolation Panel [NHANES]    Frequency of Communication with Friends and Family: More than three times a week    Frequency of Social Gatherings with Friends and Family: Three times a week    Attends Religious Services: More than 4 times per year    Active Member of Clubs or Organizations: No    Attends Banker Meetings: Never    Marital Status: Divorced  Catering manager Violence: Not At Risk (07/25/2022)   Humiliation, Afraid, Rape, and Kick questionnaire    Fear of Current or  Ex-Partner: No    Emotionally Abused: No    Physically Abused: No    Sexually Abused: No     Review of Systems    General:  No chills, fever, night sweats or weight changes.  Cardiovascular:  No chest pain, dyspnea on exertion, edema, orthopnea, palpitations, paroxysmal nocturnal dyspnea. Dermatological: No rash, lesions/masses Respiratory: No cough, dyspnea Urologic: No hematuria, dysuria Abdominal:   No nausea, vomiting, diarrhea, bright red blood per rectum, melena, or hematemesis Neurologic:  No visual changes, wkns, changes in mental status. All other systems reviewed and are otherwise negative except as noted above.  Physical Exam    VS:  BP 124/78   Pulse 71   Ht 5\' 4"  (1.626 m)   Wt 177 lb 3.2 oz (80.4 kg)   SpO2 94%   BMI 30.42 kg/m  , BMI Body mass index is 30.42 kg/m. GEN: Well nourished, well developed, in no acute distress. HEENT: normal. Neck: Supple, no JVD, carotid bruits, or masses. Cardiac: RRR, no murmurs, rubs, or gallops. No clubbing, cyanosis, bilateral lower extremity generalized edema to midshin.  Radials/DP/PT 2+ and equal bilaterally.  Respiratory:  Respirations regular and unlabored, clear to auscultation bilaterally. GI: Soft, nontender, nondistended, BS + x 4. MS: no deformity or atrophy. Skin: warm and dry, no rash. Neuro:  Strength and sensation are intact. Psych: Normal affect.  Accessory Clinical Findings    Recent Labs: 08/14/2022: ALT 15 04/03/2023: BUN 20; Creatinine,  Ser 1.21; Magnesium 1.9; Potassium 4.9; Sodium 137   Recent Lipid Panel    Component Value Date/Time   CHOL 206 (H) 08/14/2022 1141   TRIG 404 (H) 08/14/2022 1141   HDL 33 (L) 08/14/2022 1141   CHOLHDL 6.2 (H) 08/14/2022 1141   CHOLHDL 5.1 04/29/2017 0557   VLDL 32 04/29/2017 0557   LDLCALC 104 (H) 08/14/2022 1141   LDLDIRECT 135 (H) 12/31/2012 0949         ECG personally reviewed by me today-none today.  Echocardiogram 04/29/2017  Study Conclusions   -  Left ventricle: The cavity size was normal. There was moderate    concentric hypertrophy. Systolic function was normal. The    estimated ejection fraction was in the range of 55% to 60%.    Doppler parameters are consistent with abnormal left ventricular    relaxation (grade 1 diastolic dysfunction). There was no evidence    of elevated ventricular filling pressure by Doppler parameters.  - Mitral valve: There was no regurgitation. Valve area by pressure    half-time: 2.08 cm^2.  - Left atrium: The atrium was normal in size.  - Right ventricle: Systolic function was normal.  - Right atrium: The atrium was normal in size.  - Tricuspid valve: There was no regurgitation.  - Pericardium, extracardiac: The pericardium was normal in    appearance.   Impressions:   - There is hypokinesis of the basal inferior, inferolateral and mid    inferolateral walls, overall LVEF is preserved at 55-60%.   -------------------------------------------------------------------  Study data:  Comparison was made to the study of 04/25/2017.  Study  status:  Routine.  Procedure:  The patient reported no pain pre or  post test. Transthoracic echocardiography. Image quality was  adequate.  Study completion:  There were no complications.  Transthoracic echocardiography.  M-mode, limited 2D, limited  spectral Doppler, and color Doppler.  Birthdate:  Patient  birthdate: 11/23/1955.  Age:  Patient is 68 yr old.  Sex:  Gender:  female.    BMI: 31.2 kg/m^2.  Blood pressure:     150/71  Patient  status:  Inpatient.  Study date:  Study date: 04/29/2017. Study  time: 11:59 AM.   -------------------------------------------------------------------   -------------------------------------------------------------------  Left ventricle:  The cavity size was normal. There was moderate  concentric hypertrophy. Systolic function was normal. The estimated  ejection fraction was in the range of 55% to 60%. Doppler  parameters  are consistent with abnormal left ventricular relaxation  (grade 1 diastolic dysfunction). There was no evidence of elevated  ventricular filling pressure by Doppler parameters.   -------------------------------------------------------------------  Aortic valve:   Structurally normal valve.   Cusp separation was  normal.  Doppler:  Transvalvular velocity was within the normal  range. There was no stenosis. There was no regurgitation.    VTI  ratio of LVOT to aortic valve: 0.74. Peak velocity ratio of LVOT to  aortic valve: 0.84. Mean velocity ratio of LVOT to aortic valve:  0.86.    Mean gradient (S): 3 mm Hg. Peak gradient (S): 6 mm Hg.    -------------------------------------------------------------------  Mitral valve:   Structurally normal valve.   Leaflet separation was  normal.  Doppler:  Transvalvular velocity was within the normal  range. There was no evidence for stenosis. There was no  regurgitation.    Valve area by pressure half-time: 2.08 cm^2.  Indexed valve area by pressure half-time: 1.04 cm^2/m^2.   -------------------------------------------------------------------  Left atrium:  The atrium was normal in size.   -------------------------------------------------------------------  Right ventricle:  The cavity size was normal. Wall thickness was  normal. Systolic function was normal.   -------------------------------------------------------------------  Pulmonic valve:    Structurally normal valve.   Cusp separation was  normal.  Doppler:  Transvalvular velocity was within the normal  range. There was no regurgitation.   -------------------------------------------------------------------  Tricuspid valve:   Structurally normal valve.   Leaflet separation  was normal.  Doppler:  Transvalvular velocity was within the normal  range. There was no regurgitation.   -------------------------------------------------------------------  Right atrium:  The atrium was normal  in size.   -------------------------------------------------------------------  Pericardium: The pericardium was normal in appearance.   Assessment & Plan   1.Coronary artery disease-denies anginal type symptoms.  With cardiac catheterization with PCI and DES to her circumflex in 2019.  Maintaining baseline physical activity. Continue carvedilol, aspirin Continue heart healthy diet Maintain and increase physical activity  Hyperlipidemia, statin intolerant-statin therapy previously held in the setting of elevated LFTs.  Again, does not wish to resume statin therapy at this time.  Approved for Repatha 7/24 with the help of the pharmacy lipid clinic. Continue ezetimibe Repeat fasting lipids and LFTs  Essential hypertension-BP today 124/78.  Noticing some bilateral lower extremity swelling with amlodipine.  Will decrease amlodipine to 5 mg daily Maintain blood pressure log Continue  olmesartan, hydrochlorothiazide Decrease amlodipine to 5 mg daily Increase carvedilol to 18.75 mg twice daily Heart healthy low-sodium diet-reviewed Increase physical activity as tolerated  CKD stage IIIa-creatinine 1.21 on 04/04/2023 Follows with PCP  Disposition: Follow-up with Dr. Sw[place]il[place] or me in 9-12 months.  Thomasene Ripple. Alonte Wulff NP-C     04/20/2023, 12:08 PM [facility] [address] Office [phone] Fax [phone]    I spent 13 minutes examining this patient, reviewing medications, and using patient centered shared decision making involving her cardiac care.  Prior to her visit I spent greater than 20 minutes reviewing her past medical history,  medications, and prior cardiac tests.

## 2023-04-20 ENCOUNTER — Ambulatory Visit: Payer: 59 | Attending: General Practice | Admitting: General Practice

## 2023-04-20 ENCOUNTER — Encounter: Payer: Self-pay | Admitting: General Practice

## 2023-04-20 VITALS — BP 124/78 | HR 71 | Ht 64.0 in | Wt 177.2 lb

## 2023-04-20 DIAGNOSIS — E785 Hyperlipidemia, unspecified: Secondary | ICD-10-CM | POA: Diagnosis not present

## 2023-04-20 DIAGNOSIS — Z5309 Procedure and treatment not carried out because of other contraindication: Secondary | ICD-10-CM

## 2023-04-20 DIAGNOSIS — I25110 Atherosclerotic heart disease of native coronary artery with unstable angina pectoris: Secondary | ICD-10-CM

## 2023-04-20 DIAGNOSIS — E1169 Type 2 diabetes mellitus with other specified complication: Secondary | ICD-10-CM

## 2023-04-20 DIAGNOSIS — I10 Essential (primary) hypertension: Secondary | ICD-10-CM

## 2023-04-20 MED ORDER — AMLODIPINE BESYLATE 5 MG PO TABS: 5.0000 mg | ORAL_TABLET | Freq: Every day | ORAL | 12 refills | Status: DC

## 2023-04-20 MED ORDER — CARVEDILOL 12.5 MG PO TABS: 18.7500 mg | ORAL_TABLET | Freq: Two times a day (BID) | ORAL | 12 refills | Status: DC

## 2023-04-20 NOTE — Telephone Encounter (Signed)
 New, Holly Acosta, Holly Acosta, CMA; Holly Acosta; Holly Acosta; Holly Acosta; [name], Holly Acosta; 1 other Hello Holly Acosta,  Looks like patient was setup on 04-07-23 and may have had some issues. Looks like out RT team reached out to patient 04-10-23 and discussed her issues using the pap unit.  Looks like the issues was resovled on the call. See note:  Pt's granddaughter Holly Acosta called in got verbal consent from pt hippa verified she stated pt has not been able to use it due to high leak detected aware to remove chamber open & close it reinsert it they went ahead and did that machine started working an did not shut off aware if they have anymore issues to give [place] Acosta call back pt understood EOC  Thank you,  Holly Acosta

## 2023-04-20 NOTE — Patient Instructions (Signed)
 Medication Instructions:  DECREASE AMLODIPINE 5MG  DAILY INCREASE CARVEDILOL 18.75MG  TWICE DAILY   *If you need a refill on your cardiac medications before your next appointment, please call your pharmacy*  Lab Work: FASTING LIPID AND LFT WHEN YOU ARE FASTING ABOUT 1 MONTH OR LESS If you have labs (blood work) drawn today and your tests are completely normal, you will receive your results only by:  MyChart Message (if you have MyChart) OR A paper copy in the mail  If you have any lab test that is abnormal or we need to change your treatment, we will call you to review the results.  Testing/Procedures: NONE  Follow-Up: At [facility], you and your health needs are our priority.  As part of our continuing mission to provide you with exceptional heart care, our providers are all part of one team.  This team includes your primary Cardiologist (physician) and Advanced Practice Providers or APPs (Physician Assistants and Nurse Practitioners) who all work together to provide you with the care you need, when you need it.  Your next appointment:   9-12 month(s)  Provider:   Peter Swaziland, MD     We recommend signing up for the patient portal called "MyChart".  Sign up information is provided on this After Visit Summary.  MyChart is used to connect with patients for Virtual Visits (Telemedicine).  Patients are able to view lab/test results, encounter notes, upcoming appointments, etc.  Non-urgent messages can be sent to your provider as well.   To learn more about what you can do with MyChart, go to [email or web address].   Other Instructions  [employer]  Outlet Store  Training and development officer for [employer] Mailing Address:  [address];   [address]  [place], [place] [address]  Tel [phone] Fx [phone]     High Quality Legwear for Today's Active Lifestyles Maximum Compression at the ankle. Compression lessens gradually up the leg.   We manufacture a  wide range of compression hosiery for men and women in  different styles, constructions and levels of support.  How Compression Hosiery Works Regulatory affairs officer, [employer]. compression hosiery works by applying graduated pressure to the  muscles and veins in the legs.  When the calf muscle contracts such as during walking  the compression hosiery will "give" and then return to its original position. By doing so  the hosiery is assists your body's circulatory wellness.  The result is increased leg health and vitality.   Maximum Compression at the ankle Compression lessens gradually up the leg  We Offer: Sheer & Opaque Stockings       COLORS:  Nude, black, white and misc. prints Below Knee Thigh High Pantyhose  High Quality Legwear for Today's Active Lifestyles We manufacture a wide range of compression hosiery for men and women in different styles, construction sand levels of support.  Socks:                     Sheer & Opaque   Compression Levels Include:                                  Stockings Men's               Below Knee                8-15 mmHg   Women's  Thigh High                 15-20 mmHg  Unisex             Pantyhose                 20-30 mmHg                                                                      30-40 mmHg  4 Simple Ways to Order   Email  [email or web address] Mail/Email orders are subject to processing and handling charges. Allow 7-10 days for receipt.  Phone [phone]  Please allow 24 hours for return call.   In Person  We recommend calling prior to your visit to confirm store hours as they may change due to holiday, weather, and maintenance.   By Mail When placing an order, please have the following information available. Our representatives are available to assist.     Measurements    THIGH      in.   CALF        in.   ANKLE     in.    Compression  8-15 mmHg >>15-20 mmHg<<   20-30 mmHg 30-40 mmHg   WOMEN'S MEN'S  Shoe Size Sock Size  Shoe Size Sock Size  4 - 5 Small 7.5 and Under Small  5.5 - 7.5 Medium 8 - 10 Medium  8 - 10 Large 10.5 - 12 Large  10.5 and Over X-Large 12.5 and Over X-Large   Knee High Size Chart  Length from CALF MEASUREMENT  floor to bend   in knee. 11" 12" 13" 14" 15" 16" 17" 18" 19" 20" 21" 22"  14" S S S S M M L L L XL XL XL  15" S S S M M L L L XL XL XXL XXL  16" S S M M M L L L XL XXL XXL XXL  17" S M M M M L L XL XL Randa Lynn XXL  18" M M M M L L L XL XL XXL XXL XXL  19" M M M M L L XL XL XL XXL XXL XXL   Thigh High Circumference Sizing Chart                 S M L XL XXL  ANKLE 6.5" - 8" 8" - 9.5" 9.5" - 11" 11" - 12.5" 12.5" - 14"  CALF 10.5" - 14.5" 11.5" - 15.5" 12.5" - 17" 13.5" - 17.5" 14.5" - 19.5"  THIGH 15.5" - 22" 17.5" - 24" 19.5" - 26" 22" - 28" 26" - 32"  HIP UP TO 40" UP TO 44" UP TO 48' UP TO 52" UP TO 56"   Pantyhose Size Chart  Height Petite Medium Tall X-Tall [place] +   Weight Weight Weight Weight Weight Weight  4'11" 95-130 135      5'0 95-125 130-145   170-185   5'1" 90-120 125-155 160-165  170-195   5'2" 90-115 120-145 150-165  170-195   5'3" 90-110 115-140 145-165  170-200 200-225  5'4" 100-105 110-135 140-160 165 170-200 200-225  5'5" 100 105-130 135-160 165 170-200 200-225  5'6"  110-125  130-155 160-165 170-200 200-225  5'7"  110-120 125-150 155-165 170-200 195-225  5'8"   120-145 150-165 170-200 190-225  5'9"   125-140 145-170 175-190 185-220  5'10"   125-135 140-185  185-215  5'11"   130-135 140-185  190-210         1st Floor: - Lobby - Registration  - Pharmacy  - Lab - Cafe  2nd Floor: - PV Lab - Diagnostic Testing (echo, CT, nuclear med)  3rd Floor: - Vacant  4th Floor: - TCTS (cardiothoracic surgery) - AFib Clinic - Structural Heart Clinic - Vascular Surgery  - Vascular Ultrasound  5th Floor: - [facility] (general and EP) - Clinical Pharmacy for coumadin, hypertension, lipid, weight-loss medications, and med  management appointments    Valet parking services will be available as well.

## 2023-04-21 ENCOUNTER — Encounter

## 2023-04-21 NOTE — Telephone Encounter (Signed)
 Patient called stating that she is not feeling well and wants to rs.  She has been r/s for 06/01/23 at 8 pm  Mailed new packet to the patient.

## 2023-05-01 ENCOUNTER — Ambulatory Visit: Payer: Self-pay | Admitting: Family Medicine

## 2023-05-01 NOTE — Progress Notes (Deleted)
    SUBJECTIVE:   CHIEF COMPLAINT / HPI:   *** Saw cardiology 3/31 - had BLE swelling with amlodipine, decreased to 5mg  daily. Increased carvedilol to 18.75mg  BID, continued olmesartan and hydrochlorothiazide. Had lipids/hepatic panel drawn  A1c 02/2023 12.3  PERTINENT  PMH / PSH: CAD, HTN, OSA, HLD, T2DM, OA, CKD3a  OBJECTIVE:   There were no vitals taken for this visit.  ***  ASSESSMENT/PLAN:   Assessment & Plan      Para March, DO [facility]

## 2023-05-03 ENCOUNTER — Other Ambulatory Visit: Payer: Self-pay | Admitting: Family Medicine

## 2023-05-06 DIAGNOSIS — G4733 Obstructive sleep apnea (adult) (pediatric): Secondary | ICD-10-CM | POA: Diagnosis not present

## 2023-05-11 ENCOUNTER — Other Ambulatory Visit: Payer: Self-pay

## 2023-05-11 DIAGNOSIS — D508 Other iron deficiency anemias: Secondary | ICD-10-CM

## 2023-05-14 ENCOUNTER — Other Ambulatory Visit: Payer: Self-pay | Admitting: Family Medicine

## 2023-05-14 DIAGNOSIS — D508 Other iron deficiency anemias: Secondary | ICD-10-CM

## 2023-05-14 NOTE — Telephone Encounter (Signed)
 So I contacted the patient and scheduled her for 05/29 if you would like for her to come in sooner just let me know and I will call and reschedule her.

## 2023-05-15 NOTE — Telephone Encounter (Signed)
 Spoke with patient. Has appt on 5/29. Linnie Riches, CMA

## 2023-05-15 NOTE — Telephone Encounter (Signed)
 Okay and you're welcome.

## 2023-05-21 DIAGNOSIS — G4733 Obstructive sleep apnea (adult) (pediatric): Secondary | ICD-10-CM | POA: Diagnosis not present

## 2023-05-27 ENCOUNTER — Ambulatory Visit: Payer: Self-pay

## 2023-05-27 NOTE — Patient Outreach (Signed)
 Complex Care Management   Visit Note  05/27/2023  Name:  Holly Acosta MRN: [id number] DOB: [birth year]  Situation: Referral received for Complex Care Management related to Diabetes with Complications I obtained verbal consent from Patient.  Visit completed with patient  on the phone  Background:   Past Medical History:  Diagnosis Date   Acute cerebrovascular accident (CVA) of cerebellum (HCC) 04/29/2017   Acute cerebrovascular accident (CVA) of cerebellum (HCC) 04/29/2017   Acute respiratory failure with hypoxia (HCC)    AKI (acute kidney injury) (HCC)    Anemia    Arthritis of knee    "both knees" (04/24/2017)   Cataract    Cerebrovascular accident (CVA) due to occlusion of left posterior cerebral artery (HCC) 12/03/2017   Chest pain 04/24/2017   COLONIC POLYPS, HYPERPLASTIC 09/13/2009   Qualifier: Diagnosis of  By: Jayne Mews MD, Elizabeth     Constipation 06/02/2017   Coronary artery disease involving native coronary artery of native heart with unstable angina pectoris (HCC) 04/30/2017   Dehydration    Diabetes mellitus, type 2 (HCC)    Excessive daytime sleepiness 12/03/2017   History of blood transfusion    "related to prolapsed uterus"   HLD (hyperlipidemia)    HTN (hypertension)    Non-ST elevation (NSTEMI) myocardial infarction (HCC)    Non-ST elevation (NSTEMI) myocardial infarction (HCC)    Pneumonia due to COVID-19 virus 02/18/2019   Prolapsed uterus    Severe obstructive sleep apnea-hypopnea syndrome 01/01/2018   Sleep apnea    "have mask; haven't worn it for a long time" (04/24/2017)   Status post coronary artery stent placement    Umbilical hernia    Uterine prolapse 01/20/2006   Qualifier: Diagnosis of   By: Jayne Mews MD, Collin Deal hernia 01/26/2009   Qualifier: Diagnosis of   By: Jayne Mews MD, Nellie Banas      Replacing diagnoses that were inactivated after the 04/21/22 regulatory import      Assessment:  I engaged in a conversation with [name];  however, she did not have one of her sensors activated and was therefore unable to provide any readings. I requested the replacement of her sensor and monitoring device, as well as the documentation of her values. Furthermore, I asked for the monitoring of her food and fluid intake in preparation for our next visit. Patient Reported Symptoms:  Cognitive Cognitive Status: Able to follow simple commands, Alert and oriented to person, place, and time, Insightful and able to interpret abstract concepts      Neurological Neurological Review of Symptoms: No symptoms reported    HEENT HEENT Symptoms Reported: Change or loss of hearing HEENT Conditions: Ear problem(s) HEENT Comment: Mild to moderate heearing loss needs to get a hearing aid that sshe plans to get Ear problem(s)  Cardiovascular Cardiovascular Symptoms Reported: Swelling in legs or feet Does patient have uncontrolled Hypertension?: No Cardiovascular Conditions: Hypertension Cardiovascular Management Strategies:  (elevate her legs)  Respiratory Respiratory Symptoms Reported: No symptoms reported    Endocrine Patient reports the following symptoms related to hypoglycemia or hyperglycemia : Increased urination Is patient diabetic?: Yes Is patient checking blood sugars at home?: Yes Endocrine Conditions: Diabetes Endocrine Management Strategies: Medication therapy, Medical device Endocrine Comment: uses libre 3  Gastrointestinal Gastrointestinal Symptoms Reported: No symptoms reported      Genitourinary Genitourinary Symptoms Reported: No symptoms reported    Integumentary Integumentary Symptoms Reported: No symptoms reported    Musculoskeletal Musculoskelatal Symptoms Reviewed: Difficulty walking,  Muscle pain, Unsteady gait Musculoskeletal Conditions: Osteoarthritis Musculoskeletal Management Strategies: Medical device, Medication therapy Musculoskeletal Self-Management Outcome: 3 (uncertain) Falls in the past year?: No     Psychosocial Psychosocial Symptoms Reported: No symptoms reported     Quality of Family Relationships: supportive Do you feel physically threatened by others?: No      05/27/2023    3:27 PM  Depression screen PHQ 2/9  Decreased Interest 1  Down, Depressed, Hopeless 1  PHQ - 2 Score 2  Altered sleeping 0  Tired, decreased energy 1  Change in appetite 1  Feeling bad or failure about yourself  0  Trouble concentrating 1  Moving slowly or fidgety/restless 0  Suicidal thoughts 0  PHQ-9 Score 5  Difficult doing work/chores Not difficult at all    There were no vitals filed for this visit.  Medications Reviewed Today     Reviewed by Augustin Leber, RN (Registered Nurse) on 05/27/23 at 1512  Med List Status: <None>   Medication Order Taking? Sig Documenting Provider Last Dose Status Informant  amLODipine  (NORVASC ) 5 MG tablet [id number] Yes Take 1 tablet (5 mg total) by mouth at bedtime. Carie Charity, NP Taking Active   aspirin  EC 81 MG tablet [id number] Yes Take 1 tablet (81 mg total) by mouth daily. Mimi Alt, MD Taking Active Self, Pharmacy Records  B-D UF III MINI PEN NEEDLES 31G X 5 MM MISC [id number] Yes USE AS DIRECTED 3 TIMES A DAY Everhart, Kirstie, DO Taking Active   carvedilol  (COREG ) 12.5 MG tablet [id number] Yes Take 1.5 tablets (18.75 mg total) by mouth 2 (two) times daily with a meal. Eduard Grad, Chet Cota, NP Taking Active   Continuous Glucose Sensor (FREESTYLE LIBRE 3 SENSOR) [place] [id number] Yes Place 1 sensor on the skin every 14 days. Use to check glucose continuously Everhart, Kirstie, DO Taking Active   cyanocobalamin  ([employer] VITAMIN B12) 1000 MCG tablet [id number] Yes Take 1 tablet (1,000 mcg total) by mouth daily. Everhart, Kirstie, DO Taking Active   Evolocumab  (REPATHA  SURECLICK) 140 MG/ML SOAJ [id number] Yes INJECT 140 MG INTO THE SKIN EVERY 14 (FOURTEEN) DAYS. Swaziland, Peter M, MD Taking Active   ezetimibe  (ZETIA ) 10 MG tablet [id number] Yes TAKE 1 TABLET BY MOUTH  EVERY DAY Carie Charity, NP Taking Active   ferrous sulfate  325 (65 FE) MG tablet [id number] Yes TAKE 1 TABLET BY MOUTH TWICE A DAY WITH FOOD Clyda Dark, DO Taking Active   insulin  glargine (LANTUS  SOLOSTAR) 100 UNIT/ML Solostar Pen [id number] Yes Inject 28 Units into the skin daily. INJECT 34 UNITS INTO SKIN DAILY McDiarmid, Demetra Filter, MD Taking Active   insulin  lispro (HUMALOG  KWIKPEN) 100 UNIT/ML KwikPen [id number] Yes Inject 14-18 Units into the skin See admin instructions. Inject 18 units with first two meals of the day and inject 14 units with the last meal of the day. McDiarmid, Demetra Filter, MD Taking Active   metFORMIN  (GLUCOPHAGE ) 1000 MG tablet [id number] Yes TAKE 1 TABLET (1,000 MG TOTAL) BY MOUTH TWICE A DAY WITH FOOD Ganta, Anupa, DO Taking Active   olmesartan -hydrochlorothiazide  (BENICAR  HCT) 20-12.5 MG tablet [id number] Yes TAKE 1 TABLET BY MOUTH EVERY DAY Everhart, Kirstie, DO Taking Active   Omega-3 Fatty Acids (FISH OIL) 1000 MG CAPS [id number] Yes Take 1,000 mg by mouth daily. [provider] Taking Active Self, Pharmacy Records            Recommendation:   PCP Follow-up  Follow Up Plan:   Telephone follow up appointment with care management  team member scheduled for:  06/30/23  130 pm    Augustin Leber RN, BSN, [facility] [place]  [facility], [facility]  Care Coordinator Phone: [phone]

## 2023-05-27 NOTE — Patient Instructions (Signed)
 Visit Information  Thank you for taking time to visit with me today. Please don't hesitate to contact me if I can be of assistance to you before our next scheduled appointment.  Your next care management appointment is Telephone follow up appointment with care management team member scheduled for:  06/30/23  130 pm   Please call the care guide team at [phone] if you need to cancel, schedule, or reschedule an appointment.   Please call [phone]-TALK (toll free, 24 hour hotline) if you are experiencing a Mental Health or Behavioral Health Crisis or need someone to talk to.   Augustin Leber RN, BSN, [facility] [place]  [facility], [facility] Health  Care Coordinator Phone: [phone]

## 2023-06-01 ENCOUNTER — Ambulatory Visit (INDEPENDENT_AMBULATORY_CARE_PROVIDER_SITE_OTHER): Admitting: Neurology

## 2023-06-01 DIAGNOSIS — E119 Type 2 diabetes mellitus without complications: Secondary | ICD-10-CM

## 2023-06-01 DIAGNOSIS — E1159 Type 2 diabetes mellitus with other circulatory complications: Secondary | ICD-10-CM

## 2023-06-01 DIAGNOSIS — I25110 Atherosclerotic heart disease of native coronary artery with unstable angina pectoris: Secondary | ICD-10-CM

## 2023-06-01 DIAGNOSIS — G4734 Idiopathic sleep related nonobstructive alveolar hypoventilation: Secondary | ICD-10-CM

## 2023-06-01 DIAGNOSIS — I152 Hypertension secondary to endocrine disorders: Secondary | ICD-10-CM

## 2023-06-01 DIAGNOSIS — Z794 Long term (current) use of insulin: Secondary | ICD-10-CM

## 2023-06-01 DIAGNOSIS — G4733 Obstructive sleep apnea (adult) (pediatric): Secondary | ICD-10-CM

## 2023-06-05 ENCOUNTER — Ambulatory Visit: Payer: Self-pay | Admitting: Neurology

## 2023-06-05 DIAGNOSIS — G4733 Obstructive sleep apnea (adult) (pediatric): Secondary | ICD-10-CM | POA: Diagnosis not present

## 2023-06-05 NOTE — Procedures (Signed)
 [facility] PAP TITRATION INTERPRETATION REPORT   STUDY DATE: 06/01/2023      PATIENT NAME:  Holly Acosta         DATE OF BIRTH:  22-Oct-1955  PATIENT ID:  [id number]    TYPE OF STUDY:  BiPAP  PHYSICIAN: Neomia Banner, MD Referred by Terrilyn Fick, NP   SCORING TECHNICIAN: Octavia Belton, RPSGT   HISTORY: This 68 year-old Female was seen on 12-03-2022 by Terrilyn Fick, NP.  The patient had recently noted a default message on her BIPAP machine and needs a new device. Last used settings of  21/ 18 cm water pressure.   A new mask had taken care of  large air leaks .   The Epworth Sleepiness Scale was endorsed at  0 out of 24 (scores above or equal to 10 are suggestive of hypersomnolence).  DESCRIPTION: A sleep technologist was in attendance for the duration of the recording.  Data collection, scoring, video monitoring, and reporting were performed in compliance with the AASM Manual for the Scoring of Sleep and Associated Events. A physician certified by the American Board of Sleep Medicine reviewed each epoch of the study.  ADDITIONAL INFORMATION:  Height: 64.0 in Weight: 171 lbs (BMI 29) Neck Size: 0.0     MEDICATIONS: Tylenol , Norvasc , Aspirin , Coreg , Vitamin B12, Farxiga , Repatha , Iron, Lantus  Solostar, Zestril , Glucophage , fish oil, Semglee , Senokot S   SLEEP CONTINUITY AND SLEEP ARCHITECTURE: BiPAP titration started at 10/5 cm water under a Vitera FFM  in small size. Pressure as advanced to a final pressure of 18/14 cm water  with no residual snoring being noted- but was not able to continue to sleep after 2 AM.  In retrospect, the BiPAP setting of  18/13 cm water did work better for the patient, the AHI was  8.2/h and the patient slept one hour uninterrupted.     Lights off was at 21:36: and lights on 04:46: (7.2 hours in bed). Total sleep time was 247.5 minutes (100.0% supine;  0.0% lateral;  0.0% prone), with a decreased sleep efficiency at 57.6%.  Sleep  latency was brief at 4.0 minutes.   Of the total sleep time, the percentage of stage N1 sleep was 2.6%, stage N2 sleep was 59.6%, stage N3 sleep was 30.3%, and REM sleep was 7.5%.  There were 4 Stage R periods observed on this study night, 4 awakenings (i.e. transitions to Stage W from any sleep stage), and 29.0 total stage transitions. Wake after sleep onset (WASO) time accounted for 178 minutes.  AROUSAL: There were 32 arousals in total, for an arousal index of 7.8 arousals/hour.  Of these, 7 were identified as respiratory-related arousals (1.7 /h), 0 were PLM-related arousals (0.0 /h), and 27 were non-specific arousals (6.5 /h)  RESPIRATORY MONITORING:  Based on CMS criteria (using a 4% oxygen desaturation rule for scoring hypopneas), there were 14 apneas (11 obstructive; 3 central; 0 mixed), and 31 hypopneas.   Apnea index was 3.4.  Hypopnea index was 7.5.  The apnea-hypopnea index was 10.9 /h overall (10.9 supine, 0.0 non-supine; 6.5/h in REM, 6.5 supine REM). There were 0 respiratory effort-related arousals (RERAs).  The RERA index was 0.0 events/h.   OXIMETRY: Total sleep time spent at, or below 88% was 58.8 minutes, or 23.8% of total sleep time. Respiratory events were associated with oxyhemoglobin desaturations (nadir during sleep 82%) from a mean of 91%). The EKG recorded a regular rhythm, NSR, with some PVCs and some PACs .  The  average heart rate during sleep was 64 bpm.  The maximum heart rate during sleep was 91 bpm. The maximum heart rate during recording was 91.   BODY POSITION: Duration of total sleep and percent of total sleep in their respective position is as follows: supine 247 minutes (100.0%). Total supine REM sleep time was 18 minutes (100.0% of total REM sleep). LIMB MOVEMENTS: There were 0 periodic limb movements of sleep (0.0/h), of which 0 (0.0/h) were associated with an arousal. The EKG recorded a regular rhythm, NSR .  The average heart rate during sleep was 64 bpm.  The  maximum heart rate during sleep was 91 bpm. The maximum heart rate during recording was 91.    BiPAP titration started at 10/5 cm water under a Vitera FFM in small size. Pressure as advanced to a final pressure of 18/14 cm water with no residual snoring being noted- but was not able to continue to sleep after 2 AM.  In retrospect, the BiPAP setting of 18/13 cm water did work better for the patient, the AHI was 8.2/h and the patient slept one hour uninterrupted.   IMPRESSION:   There was a significant reduction of sleep apnea / sleep hypopnea under a BIPAP setting of 18/ 13 cm water , and with the use of a FFM Vitera, size small.   No PLMs were noted.  Sleep ended at 2 AM for unknown reasons, the patient reported she had spontaneously woken up. RECOMMENDATIONS: ordering a BIPAP machine at a setting of 18/ 13 cm water.  I order the interface: a FFM Vitera, size small.   Heated humidification,   Compliance RV with NP or me after 60 or more days of BIPAP therapy.  Neomia Banner, MD          [facility] CPAP Summary    General Information  Name: Holly Acosta, Holly Acosta BMI: 29.35 Physician: Neomia Banner, MD  ID: [id number] Height: 64.0 in Technician: Octavia Belton, RPSGT  Sex: Female Weight: 171.0 lb Record: [id number]  Age: 27 [1955-10-06] Date: 06/01/2023     Medical & Medication History    Holly Acosta, a 68 y/o female, returns for follow up for OSA on BiPAP. Compliance had improved but she was having trouble with increased air leak. Since, she reports doing well. She has an older machine and is receiving an error message on her machine. She has not used her machine for the past week due to this message. She reports that her machine will turn off spontaneously. She is eligible for a new machine in 2020.  Tylenol , Norvasc , Aspirin , Coreg , Vitamin B12, Farxiga , Repatha  surclick, Iron, Lantus  Solostar, Zestril , Glucophage , fish oil, Semglee , Senokot S    Sleep Disorder      Comments       CPAP start time: 09:36:21 PM CPAP end time: 04:46:08 AM   Time Total Supine Side Prone Upright  Recording (TRT) 7h 9.33m 7h 9.24m 0h 0.51m 0h 0.1m 0h 0.53m  Sleep (TST) 4h 7.10m 4h 7.38m 0h 0.41m 0h 0.57m 0h 0.51m   Latency N1 N2 N3 REM Onset Per. Slp. Eff.  Actual 0h 3.62m 0h 4.43m 0h 14.5m 1h 17.104m 0h 3.5m 0h 3.63m 57.63%   Stg Dur Wake N1 N2 N3 REM  Total 11.5 6.5 147.5 75.0 18.5  Supine 11.5 6.5 147.5 75.0 18.5  Side 0.0 0.0 0.0 0.0 0.0  Prone 0.0 0.0 0.0 0.0 0.0  Upright 0.0 0.0 0.0 0.0 0.0   Stg % Wake N1 N2  N3 REM  Total 4.4 2.6 59.6 30.3 7.5  Supine 4.4 2.6 59.6 30.3 7.5  Side 0.0 0.0 0.0 0.0 0.0  Prone 0.0 0.0 0.0 0.0 0.0  Upright 0.0 0.0 0.0 0.0 0.0     Apnea Summary Sub Supine Side Prone Upright  Total 14 Total 14 14 0 0 0    REM 0 0 0 0 0    NREM 14 14 0 0 0  Obs 11 REM 0 0 0 0 0    NREM 11 11 0 0 0  Mix 0 REM 0 0 0 0 0    NREM 0 0 0 0 0  Cen 3 REM 0 0 0 0 0    NREM 3 3 0 0 0   Rera Summary Sub Supine Side Prone Upright  Total 0 Total 0 0 0 0 0    REM 0 0 0 0 0    NREM 0 0 0 0 0   Hypopnea Summary Sub Supine Side Prone Upright  Total 33 Total 33 33 0 0 0    REM 2 2 0 0 0    NREM 31 31 0 0 0   4% Hypopnea Summary Sub Supine Side Prone Upright  Total (4%) 31 Total 31 31 0 0 0    REM 2 2 0 0 0    NREM 29 29 0 0 0     AHI Total Obs Mix Cen  11.39 Apnea 3.39 2.67 0.00 0.73   Hypopnea 8.00 -- -- --  10.91 Hypopnea (4%) 7.52 -- -- --    Total Supine Side Prone Upright  Position AHI 11.39 11.39 0.00 0.00 0.00  REM AHI 6.49   NREM AHI 11.79   Position RDI 11.39 11.39 0.00 0.00 0.00  REM RDI 6.49   NREM RDI 11.79    4% Hypopnea Total Supine Side Prone Upright  Position AHI (4%) 10.91 10.91 0.00 0.00 0.00  REM AHI (4%) 6.49   NREM AHI (4%) 11.27   Position RDI (4%) 10.91 10.91 0.00 0.00 0.00  REM RDI (4%) 6.49   NREM RDI (4%) 11.27    Desaturation Information  <100% <90% <80% <70% <60% <50% <40%  Supine 58 49 0 0 0  0 0  Side 0 0 0 0 0 0 0  Prone 0 0 0 0 0 0 0  Upright 0 0 0 0 0 0 0  Total 58 49 0 0 0 0 0  Desaturation threshold setting: 4% Minimum desaturation setting: 10 seconds SaO2 nadir: 82% The longest event was a 27 sec obstructive Hypopnea with a minimum SaO2 of 86%. The lowest SaO2 was 82% associated with a 16 sec obstructive Hypopnea. EKG Rates EKG Avg Max Min  Awake 71 84 58  Asleep 64 91 55  EKG Events:  Awakening/Arousal Information # of Awakenings 4  Wake after sleep onset 178.69m  Wake after persistent sleep 178.55m   Arousal Assoc. Arousals Index  Apneas 1 0.2  Hypopneas 6 1.5  Leg Movements 0 0.0  Snore 0.0 0.0  PTT Arousals 0 0.0  Spontaneous 27 6.5  Total 34 8.2  Myoclonus Information PLMS LMs Index  Total LMs during PLMS 0 0.0  LMs w/ Microarousals 0 0.0   LM LMs Index  w/ Microarousal 0 0.0  w/ Awakening 0 0.0  w/ Resp Event 0 0.0  Spontaneous 0 0.0  Total 0 0.0     [facility] CPAP/Bilevel Report  General Information  Name: Holly Acosta, Holly Acosta BMI: 29 Physician: ,   ID: [id number] Height: 64 in Technician: Octavia Belton  Sex: Female Weight: 171 lb Record: [id number]  Age: 5 [26-Mar-1955] Date: 06/01/2023 Scorer: Octavia Belton   Recommended Settings IPAP: N/A cmH20 EPAP: N/A cmH2O AHI: N/A AHI (4%): N/A   Pressure IPAP/EPAP 00 10 / 05 12 / 07 14 / 09 16 / 11 18 / 13 18 / 14   O2 Vol 0.0 0.0 0.0 0.0 0.0 0.0 0.0  Time TRT 0.21m 26.29m 54.40m 21.64m 53.31m 66.37m 209.18m   TST 0.15m 22.62m 49.4m 20.39m 51.23m 65.82m 38.18m  Sleep Stage % Wake 0.0 0.0 10.1 2.4 2.8 0.8 0.0   % REM 0.0 0.0 0.0 36.6 0.0 16.8 0.0   % N1 0.0 4.4 3.1 9.8 2.9 0.8 0.0   % N2 0.0 44.4 16.3 53.7 64.1 71.8 100.0   % N3 0.0 51.1 80.6 0.0 33.0 10.7 0.0  Respiratory Total Events 0 7 5 7 12 9 7    Obs. Apn. 0 0 0 0 1 7 3    Mixed Apn. 0 0 0 0 0 0 0   Cen. Apn. 0 0 0 0 1 1 1    Hypopneas 0 7 5 7 10 1 3    AHI 0.00 18.67 6.12 20.49 13.98 8.24 10.91   Supine  AHI 0.00 18.67 6.12 20.49 13.98 8.24 10.91   Prone AHI 0.00 0.00 0.00 0.00 0.00 0.00 0.00   Side AHI 0.00 0.00 0.00 0.00 0.00 0.00 0.00  Respiratory (4%) Hypopneas (4%) 0.00 7.00 5.00 7.00 9.00 1.00 2.00   AHI (4%) 0.00 18.67 6.12 20.49 12.82 8.24 9.35   Supine AHI (4%) 0.00 18.67 6.12 20.49 12.82 8.24 9.35   Prone AHI (4%) 0.00 0.00 0.00 0.00 0.00 0.00 0.00   Side AHI (4%) 0.00 0.00 0.00 0.00 0.00 0.00 0.00  Desat Profile <= 90% 0.69m 21.72m 49.48m 16.69m 6.33m 1.63m 0.83m   <= 80% 0.21m 0.64m 0.41m 0.33m 0.57m 0.47m 0.41m   <= 70% 0.13m 0.39m 0.15m 0.97m 0.58m 0.44m 0.75m   <= 60% 0.43m 0.43m 0.58m 0.25m 0.39m 0.21m 0.62m  Arousal Index Apnea 0.0 0.0 0.0 0.0 0.0 0.9 0.0   Hypopnea 0.0 0.0 1.2 2.9 2.3 0.9 1.6   LM 0.0 0.0 0.0 0.0 0.0 0.0 0.0   Spontaneous 0.0 2.7 0.0 23.4 3.5 11.9 3.1

## 2023-06-09 ENCOUNTER — Other Ambulatory Visit: Payer: Self-pay | Admitting: Family Medicine

## 2023-06-09 DIAGNOSIS — D508 Other iron deficiency anemias: Secondary | ICD-10-CM

## 2023-06-18 ENCOUNTER — Ambulatory Visit: Admitting: Family Medicine

## 2023-06-18 NOTE — Progress Notes (Deleted)
    SUBJECTIVE:   CHIEF COMPLAINT / HPI:   Iron level recheck  PERTINENT  PMH / PSH: ***  OBJECTIVE:   There were no vitals taken for this visit.  ***  ASSESSMENT/PLAN:   Assessment & Plan Insulin  dependent type 2 diabetes mellitus (HCC)      Sarahann Cumins, DO [place] [facility]

## 2023-06-23 ENCOUNTER — Encounter (HOSPITAL_COMMUNITY): Payer: Self-pay

## 2023-06-23 ENCOUNTER — Ambulatory Visit (INDEPENDENT_AMBULATORY_CARE_PROVIDER_SITE_OTHER): Admitting: Family Medicine

## 2023-06-23 ENCOUNTER — Emergency Department (HOSPITAL_COMMUNITY)
Admission: EM | Admit: 2023-06-23 | Discharge: 2023-06-23 | Disposition: A | Discharge status: Home / Self Care | Attending: Emergency Medicine | Admitting: Emergency Medicine

## 2023-06-23 ENCOUNTER — Encounter: Payer: Self-pay | Admitting: Family Medicine

## 2023-06-23 VITALS — BP 162/80 | HR 80 | Ht 64.0 in | Wt 174.6 lb

## 2023-06-23 DIAGNOSIS — E119 Type 2 diabetes mellitus without complications: Secondary | ICD-10-CM

## 2023-06-23 DIAGNOSIS — E1065 Type 1 diabetes mellitus with hyperglycemia: Secondary | ICD-10-CM | POA: Diagnosis not present

## 2023-06-23 DIAGNOSIS — Z743 Need for continuous supervision: Secondary | ICD-10-CM | POA: Diagnosis not present

## 2023-06-23 DIAGNOSIS — Z794 Long term (current) use of insulin: Secondary | ICD-10-CM

## 2023-06-23 DIAGNOSIS — R739 Hyperglycemia, unspecified: Secondary | ICD-10-CM

## 2023-06-23 DIAGNOSIS — Z7982 Long term (current) use of aspirin: Secondary | ICD-10-CM | POA: Insufficient documentation

## 2023-06-23 DIAGNOSIS — Z7984 Long term (current) use of oral hypoglycemic drugs: Secondary | ICD-10-CM | POA: Insufficient documentation

## 2023-06-23 DIAGNOSIS — R531 Weakness: Secondary | ICD-10-CM

## 2023-06-23 DIAGNOSIS — D509 Iron deficiency anemia, unspecified: Secondary | ICD-10-CM | POA: Diagnosis not present

## 2023-06-23 DIAGNOSIS — E1165 Type 2 diabetes mellitus with hyperglycemia: Secondary | ICD-10-CM | POA: Diagnosis not present

## 2023-06-23 DIAGNOSIS — R5383 Other fatigue: Secondary | ICD-10-CM | POA: Diagnosis present

## 2023-06-23 DIAGNOSIS — R6889 Other general symptoms and signs: Secondary | ICD-10-CM | POA: Diagnosis not present

## 2023-06-23 LAB — CBC
HCT: 43.5 % (ref 36.0–46.0)
Hemoglobin: 13.6 g/dL (ref 12.0–15.0)
MCH: 28.2 pg (ref 26.0–34.0)
MCHC: 31.3 g/dL (ref 30.0–36.0)
MCV: 90.2 fL (ref 80.0–100.0)
Platelets: 234 10*3/uL (ref 150–400)
RBC: 4.82 MIL/uL (ref 3.87–5.11)
RDW: 13.6 % (ref 11.5–15.5)
WBC: 6.9 10*3/uL (ref 4.0–10.5)
nRBC: 0 % (ref 0.0–0.2)

## 2023-06-23 LAB — URINALYSIS, ROUTINE W REFLEX MICROSCOPIC
Bacteria, UA: NONE SEEN
Bilirubin Urine: NEGATIVE
Glucose, UA: >=500 mg/dL — AB
Hgb urine dipstick: NEGATIVE
Ketones, ur: NEGATIVE mg/dL
Leukocytes,Ua: NEGATIVE
Nitrite: NEGATIVE
Protein, ur: NEGATIVE mg/dL
Specific Gravity, Urine: 1.011 (ref 1.005–1.030)
pH: 7 (ref 5.0–8.0)

## 2023-06-23 LAB — COMPREHENSIVE METABOLIC PANEL WITH GFR
ALT: 28 U/L (ref 0–44)
AST: 27 U/L (ref 15–41)
Albumin: 3.6 g/dL (ref 3.5–5.0)
Alkaline Phosphatase: 130 U/L — ABNORMAL HIGH (ref 38–126)
Anion gap: 7 (ref 5–15)
BUN: 18 mg/dL (ref 8–23)
CO2: 27 mmol/L (ref 22–32)
Calcium: 9.1 mg/dL (ref 8.9–10.3)
Chloride: 98 mmol/L (ref 98–111)
Creatinine, Ser: 1.08 mg/dL — ABNORMAL HIGH (ref 0.44–1.00)
GFR, Estimated: 56 mL/min — ABNORMAL LOW (ref >60)
Glucose, Bld: 421 mg/dL — ABNORMAL HIGH (ref 70–99)
Potassium: 4 mmol/L (ref 3.5–5.1)
Sodium: 132 mmol/L — ABNORMAL LOW (ref 135–145)
Total Bilirubin: 0.6 mg/dL (ref 0.0–1.2)
Total Protein: 7.4 g/dL (ref 6.5–8.1)

## 2023-06-23 LAB — GLUCOSE, POCT (MANUAL RESULT ENTRY): POC Glucose: 466 mg/dL — AB (ref 70–99)

## 2023-06-23 LAB — POCT GLYCOSYLATED HEMOGLOBIN (HGB A1C): HbA1c POC (<> result, manual entry): >15 % (ref 4.0–5.6)

## 2023-06-23 LAB — CBG MONITORING, ED
Glucose-Capillary: 435 mg/dL — ABNORMAL HIGH (ref 70–99)
Glucose-Capillary: 268 mg/dL — ABNORMAL HIGH (ref 70–99)

## 2023-06-23 MED ORDER — LACTATED RINGERS IV BOLUS
1000.0000 mL | Freq: Once | INTRAVENOUS | Status: AC
  Administered 2023-06-23: 1000 mL via INTRAVENOUS

## 2023-06-23 MED ORDER — INSULIN ASPART 100 UNIT/ML IJ SOLN
10.0000 [IU] | Freq: Once | INTRAMUSCULAR | Status: AC
  Administered 2023-06-23: 10 [IU] via INTRAVENOUS
  Filled 2023-06-23: qty 0.1

## 2023-06-23 MED ORDER — SODIUM CHLORIDE 0.9 % IV BOLUS
1000.0000 mL | Freq: Once | INTRAVENOUS | Status: AC
  Administered 2023-06-23: 1000 mL via INTRAVENOUS

## 2023-06-23 NOTE — ED Provider Triage Note (Signed)
 Emergency Medicine Provider Triage Evaluation Note  Holly Acosta , a 68 y.o. female  was evaluated in triage.  Pt complains of hyperglycemia.  Review of Systems  Positive: Noncompliance with insulin  Negative: vomiting  Physical Exam  BP (!) 170/91 (BP Location: Right Arm)   Pulse 76   Temp 98.5 F (36.9 C) (Oral)   Resp 18   Ht 5\' 4"  (1.626 m)   Wt 81.6 kg   SpO2 95%   BMI 30.90 kg/m  Gen:   Awake, no distress   Resp:  Normal effort  MSK:   Moves extremities without difficulty  Other:    Medical Decision Making  Medically screening exam initiated at 4:08 PM.  Appropriate orders placed.  Holly Acosta was informed that the remainder of the evaluation will be completed by another provider, this initial triage assessment does not replace that evaluation, and the importance of remaining in the ED until their evaluation is complete.  Stopped insulin  2 weeks ago because it made her feel tired. No pain, no vomiting. Sent from PCP where her A1c >15.    Holly Second, PA-C 06/23/23 1609

## 2023-06-23 NOTE — ED Provider Notes (Signed)
 [place] EMERGENCY DEPARTMENT AT [facility] Provider Note   CSN: [id number] Arrival date & time: 06/23/23  1536     History  Chief Complaint  Patient presents with   Hyperglycemia    Holly Acosta is a 68 y.o. female.  HPI Patient presents with her granddaughter who assists with the history.  Patient has been under increased stress has not been taking her medication for approximately 2 weeks presents with fatigue.  Today she went to her physician's office was found to be hyperglycemic, and with concern for polydipsia, fatigue she was seen here for evaluation.  EMS provided 500 mL normal saline en route.  Per EMS CBG was 500, A1c was greater than 15 at the physician's office.    Home Medications Prior to Admission medications   Medication Sig Start Date End Date Taking? Authorizing Provider  amLODipine  (NORVASC ) 5 MG tablet Take 1 tablet (5 mg total) by mouth at bedtime. 04/20/23   Carie Charity, NP  aspirin  EC 81 MG tablet Take 1 tablet (81 mg total) by mouth daily. 03/30/14   Mimi Alt, MD  B-D UF III MINI PEN NEEDLES 31G X 5 MM MISC USE AS DIRECTED 3 TIMES A DAY 05/04/23   Everhart, Kirstie, DO  carvedilol  (COREG ) 12.5 MG tablet Take 1.5 tablets (18.75 mg total) by mouth 2 (two) times daily with a meal. 04/20/23   Cleaver, Chet Cota, NP  Continuous Glucose Sensor (FREESTYLE LIBRE 3 SENSOR) MISC Place 1 sensor on the skin every 14 days. Use to check glucose continuously 11/17/22   Everhart, Kirstie, DO  cyanocobalamin  ([employer] VITAMIN B12) 1000 MCG tablet Take 1 tablet (1,000 mcg total) by mouth daily. 12/22/22   Everhart, Kirstie, DO  Evolocumab  (REPATHA  SURECLICK) 140 MG/ML SOAJ INJECT 140 MG INTO THE SKIN EVERY 14 (FOURTEEN) DAYS. 12/03/22   [place], Peter M, MD  ezetimibe  (ZETIA ) 10 MG tablet TAKE 1 TABLET BY MOUTH EVERY DAY 04/09/23   Carie Charity, NP  ferrous sulfate  325 (65 FE) MG tablet Take 1 tablet (325 mg total) by mouth daily with breakfast. 06/09/23    Everhart, Kirstie, DO  insulin  glargine (LANTUS  SOLOSTAR) 100 UNIT/ML Solostar Pen Inject 28 Units into the skin daily. INJECT 34 UNITS INTO SKIN DAILY 03/27/23   McDiarmid, Demetra Filter, MD  insulin  lispro (HUMALOG  KWIKPEN) 100 UNIT/ML KwikPen Inject 14-18 Units into the skin See admin instructions. Inject 18 units with first two meals of the day and inject 14 units with the last meal of the day. 03/27/23   McDiarmid, Demetra Filter, MD  metFORMIN  (GLUCOPHAGE ) 1000 MG tablet TAKE 1 TABLET (1,000 MG TOTAL) BY MOUTH TWICE A DAY WITH FOOD 03/17/22   Ganta, Anupa, DO  olmesartan -hydrochlorothiazide  (BENICAR  HCT) 20-12.5 MG tablet TAKE 1 TABLET BY MOUTH EVERY DAY 04/13/23   Everhart, Kirstie, DO  Omega-3 Fatty Acids (FISH OIL) 1000 MG CAPS Take 1,000 mg by mouth daily.    [provider]      Allergies    Lipitor [atorvastatin ] and Diclofenac     Review of Systems   Review of Systems  Physical Exam Updated Vital Signs BP (!) 144/107 (BP Location: Right Arm)   Pulse 73   Temp 98.1 F (36.7 C) (Oral)   Resp 16   Ht 5\' 4"  (1.626 m)   Wt 81.6 kg   SpO2 95%   BMI 30.90 kg/m  Physical Exam Vitals and nursing note reviewed.  Constitutional:      General: She is  not in acute distress.    Appearance: She is well-developed.  HENT:     Head: Normocephalic and atraumatic.  Eyes:     Conjunctiva/sclera: Conjunctivae normal.  Cardiovascular:     Rate and Rhythm: Normal rate and regular rhythm.  Pulmonary:     Effort: Pulmonary effort is normal. No respiratory distress.     Breath sounds: Normal breath sounds. No stridor.  Abdominal:     General: There is no distension.  Skin:    General: Skin is warm and dry.  Neurological:     Mental Status: She is alert and oriented to person, place, and time.     Cranial Nerves: No cranial nerve deficit.  Psychiatric:        Mood and Affect: Mood normal.     ED Results / Procedures / Treatments   Labs (all labs ordered are listed, but only abnormal results  are displayed) Labs Reviewed  URINALYSIS, ROUTINE W REFLEX MICROSCOPIC - Abnormal; Notable for the following components:      Result Value   Color, Urine COLORLESS (*)    Glucose, UA >=500 (*)    All other components within normal limits  COMPREHENSIVE METABOLIC PANEL WITH GFR - Abnormal; Notable for the following components:   Sodium 132 (*)    Glucose, Bld 421 (*)    Creatinine, Ser 1.08 (*)    Alkaline Phosphatase 130 (*)    GFR, Estimated 56 (*)    All other components within normal limits  CBG MONITORING, ED - Abnormal; Notable for the following components:   Glucose-Capillary 435 (*)    All other components within normal limits  CBG MONITORING, ED - Abnormal; Notable for the following components:   Glucose-Capillary 268 (*)    All other components within normal limits  CBC  CBG MONITORING, ED  CBG MONITORING, ED    EKG None  Radiology No results found.  Procedures Procedures    Medications Ordered in ED Medications  sodium chloride  0.9 % bolus 1,000 mL (0 mLs Intravenous Stopped 06/23/23 1810)  lactated ringers  bolus 1,000 mL (1,000 mLs Intravenous New Bag/Given 06/23/23 1810)  insulin  aspart (novoLOG ) injection 10 Units (10 Units Intravenous Given 06/23/23 1806)  sodium chloride  0.9 % bolus 1,000 mL (1,000 mLs Intravenous Bolus 06/23/23 2024)    ED Course/ Medical Decision Making/ A&P                                 Medical Decision Making Adult female with insulin -dependent diabetes, self-reported medication noncompliance presents with fatigue, weakness, primary care visit concerning for hyperglycemia.  Differential including DKA, nonketotic hyperosmolar state, hyperglycemia, dehydration, electrolyte abnormalities considered. Cardiac 75 sinus normal pulse ox 95% room air normal  Amount and/or Complexity of Data Reviewed Independent Historian:     Details: Granddaughter and EMS notes External Data Reviewed: notes. Labs: ordered. Decision-making details documented  in ED Course.  Risk Prescription drug management. Decision regarding hospitalization. Diagnosis or treatment significantly limited by social determinants of health.   9:40 PM Glucose now 268 following insulin , multiple boluses of fluid, no evidence for DKA, nonketotic hyperosmolar state, no fever, no confusion, patient and I had a lengthy conversation about the need to take her medication as prescribed, family members will assist with efforts to do so, primary care follow-up, given these reassuring findings, patient discharged in stable condition.        Final Clinical Impression(s) / ED Diagnoses Final  diagnoses:  Hyperglycemia    Rx / DC Orders ED Discharge Orders     None         Dorenda Gandy, MD 06/23/23 2140

## 2023-06-23 NOTE — Progress Notes (Signed)
    SUBJECTIVE:   CHIEF COMPLAINT / HPI:   Presented initially to have her iron levels checked Notes that for the last 2 weeks she has felt generalized fatigue, occasionally off balance, and "out of it". She has a hard time describing what she means by out of it. Jordynne notes that she did fall while getting out of the shower 2 weeks ago and hit her head on the toilet. No LOC, she was not medically evaluated. States all of these symptoms started around the same time. She also notes intermittent abdominal discomfort over the last two weeks. No vomiting or diarrhea.   States that she has not checked her sugars at home in over a week.  She got tired of pricking her finger and was too tired to put her new CGM on.  States that she occasionally takes Lantus  28 units and Humalog  14 units in the morning.  States that she gets too tired to take her insulin . She is supposed to take Lantus  34U daily, and Humalog  18U with first two meals of the day and 14U with the last meal of the day. She states she has still been eating and drinking.   She notes that her cardiologist started her on a new medication in March 2025 but she is unsure of the name.  On chart review it looks like they decreased her amlodipine  and increased her Coreg  but did not add any additional medications.  Lives with her 2 grandchildren who work.  PERTINENT  PMH / PSH: CAD, HTN, OSA, HLD, insulin  dependent T2DM, thyromegaly, OA, CKD3a, hearing difficulty  OBJECTIVE:   BP (!) 162/80   Pulse 80   Ht 5\' 4"  (1.626 m)   Wt 174 lb 9.6 oz (79.2 kg)   SpO2 98%   BMI 29.97 kg/m   Gen: no acute distress, non-toxic appearing CV: RRR, no mrg Resp: breathing comfortably on RA, CTAB Neuro: Alert and oriented to person and place. Able to state the year but not the month.   ASSESSMENT/PLAN:   Assessment & Plan Insulin  dependent type 2 diabetes mellitus (HCC) A1C >15, BGL 466 in office.  She has always been somewhat of a poor historian however I  am concerned for some developing memory loss.  She has not been taking her insulin  regularly and has not checked her sugar in over a week.  Given her vague symptoms of fatigue, abdominal pain, and a fall over the last 2 weeks I am sending her to the emergency room for further evaluation.  Ambulance was called at the clinic.  Patient comfortable with plan. Iron deficiency anemia, unspecified iron deficiency anemia type Plan to check CBC and iron panel today, will defer as she is going to the ED at this time   Scheduled f/u visit next week for DM follow up, and to obtain iron panel. Scheduled f/u with Dr. Koval in 2 weeks for med rec. Strongly encouraged pt to bring all of her medications to her next two appointments.   Sarahann Cumins, DO [facility]

## 2023-06-23 NOTE — ED Triage Notes (Addendum)
 Per EMS, Pt, from PCP office, c/o hyperglycemia, polydipsia, and fatigue.  Pt reports she stopped taking her insulin  x2 weeks ago.  Pt is unsure which came first the stopping her insulin  or the fatigue.  Pt had changes to HTN medication in March.     Pt given 500mL NS en route.    Pt's CBG was 501 and A1C>15 at PCP.

## 2023-06-23 NOTE — Patient Instructions (Signed)
 Good to see you today - Thank you for coming in  I am sending you to the emergency room because you are not feeling well and your blood sugar is really high.

## 2023-06-23 NOTE — Discharge Instructions (Signed)
 As discussed, your evaluation today has been largely reassuring.  But, it is important that you monitor your condition carefully, and do not hesitate to return to the ED if you develop new, or concerning changes in your condition.  Otherwise, please follow-up with your physician for appropriate ongoing care.  Be sure to take all of your medication as previously prescribed.

## 2023-06-23 NOTE — Assessment & Plan Note (Signed)
 A1C >15, BGL 466 in office.  She has always been somewhat of a poor historian however I am concerned for some developing memory loss.  She has not been taking her insulin  regularly and has not checked her sugar in over a week.  Given her vague symptoms of fatigue, abdominal pain, and a fall over the last 2 weeks I am sending her to the emergency room for further evaluation.  Ambulance was called at the clinic.  Patient comfortable with plan.

## 2023-06-24 ENCOUNTER — Other Ambulatory Visit: Payer: Self-pay | Admitting: Family Medicine

## 2023-06-30 ENCOUNTER — Telehealth: Payer: Self-pay

## 2023-06-30 ENCOUNTER — Ambulatory Visit: Payer: Self-pay

## 2023-06-30 ENCOUNTER — Other Ambulatory Visit: Payer: Self-pay

## 2023-06-30 NOTE — Patient Instructions (Signed)
 Visit Information  Thank you for taking time to visit with me today. Please don't hesitate to contact me if I can be of assistance to you before our next scheduled appointment.  Your next care management appointment is by telephone on 07/30/2023 at 1:30 pm  Telephone follow-up in 1 month  Please call the care guide team at [phone] if you need to cancel, schedule, or reschedule an appointment.   If you are experiencing a Mental Health or Behavioral Health Crisis or need someone to talk to:  Please call the Suicide and Crisis Lifeline: 988 call the [place]  National Suicide Prevention Lifeline: 4804376813 or TTY: 450 274 2876 TTY (581)540-3530) to talk to a trained counselor go to [facility] [address] [place] [phone]) call 911   Gilberto Labella, MSN, RN [facility]  [facility], [facility] RN Care Manager Direct Dial : [phone] Fax: [phone]

## 2023-06-30 NOTE — Patient Outreach (Signed)
 Complex Care Management   Visit Note  06/30/2023  Name:  Holly Acosta MRN: [id number] DOB: [birth year]  Situation: Referral received for Complex Care Management related to Diabetes with Complications I obtained verbal consent from Patient.  Visit completed with patient  on the phone  Background:   Past Medical History:  Diagnosis Date   Acute cerebrovascular accident (CVA) of cerebellum (HCC) 04/29/2017   Acute cerebrovascular accident (CVA) of cerebellum (HCC) 04/29/2017   Acute respiratory failure with hypoxia (HCC)    AKI (acute kidney injury) (HCC)    Anemia    Arthritis of knee    "both knees" (04/24/2017)   Cataract    Cerebrovascular accident (CVA) due to occlusion of left posterior cerebral artery (HCC) 12/03/2017   Chest pain 04/24/2017   COLONIC POLYPS, HYPERPLASTIC 09/13/2009   Qualifier: Diagnosis of  By: Jayne Mews MD, Elizabeth     Constipation 06/02/2017   Coronary artery disease involving native coronary artery of native heart with unstable angina pectoris (HCC) 04/30/2017   Dehydration    Diabetes mellitus, type 2 (HCC)    Excessive daytime sleepiness 12/03/2017   History of blood transfusion    "related to prolapsed uterus"   HLD (hyperlipidemia)    HTN (hypertension)    Non-ST elevation (NSTEMI) myocardial infarction (HCC)    Non-ST elevation (NSTEMI) myocardial infarction (HCC)    Pneumonia due to COVID-19 virus 02/18/2019   Prolapsed uterus    Severe obstructive sleep apnea-hypopnea syndrome 01/01/2018   Sleep apnea    "have mask; haven't worn it for a long time" (04/24/2017)   Status post coronary artery stent placement    Umbilical hernia    Uterine prolapse 01/20/2006   Qualifier: Diagnosis of   By: Jayne Mews MD, Collin Deal hernia 01/26/2009   Qualifier: Diagnosis of   By: Jayne Mews MD, Nellie Banas      Replacing diagnoses that were inactivated after the 04/21/22 regulatory import      Assessment: Patient Reported Symptoms:  Cognitive  Cognitive Status: Alert and oriented to person, place, and time Cognitive/Intellectual Conditions Management [RPT]: None reported or documented in medical history or problem list   Health Maintenance Behaviors: Spiritual practice(s), Sleep adequate, Stress management, Healthy diet Healing Pattern: Slow Health Facilitated by: Healthy diet, Prayer/meditation, Rest  Neurological Neurological Review of Symptoms: No symptoms reported    HEENT HEENT Symptoms Reported: No symptoms reported HEENT Conditions: Ear problem(s) (Hearing difficulty - mild to mod, qualifies for hearing aides, will explore) Ear problem(s) (Hearing difficulty - mild to mod, qualifies for hearing aides, will explore)  Cardiovascular Cardiovascular Symptoms Reported: No symptoms reported Does patient have uncontrolled Hypertension?: Yes Is patient checking Blood Pressure at home?: No (Advised patient to call insurance for BP cuff and scale) Weight: 173 lb (78.5 kg) (Patient report for appointment)  Respiratory Respiratory Symptoms Reported: No symptoms reported    Endocrine Patient reports the following symptoms related to hypoglycemia or hyperglycemia : No symptoms reported Is patient diabetic?: Yes Is patient checking blood sugars at home?: Yes Endocrine Conditions: Diabetes Endocrine Management Strategies: Medication therapy, Adequate rest, Diet modification Endocrine Self-Management Outcome: 3 (uncertain) Endocrine Comment: CGM Libre 3  Gastrointestinal Gastrointestinal Symptoms Reported: Constipation Additional Gastrointestinal Details: occasional constipation - uses prunes prn Gastrointestinal Conditions: Constipation Gastrointestinal Management Strategies: Diet modification, Medication therapy Nutrition Risk Screen (CP): No indicators present  Genitourinary Genitourinary Symptoms Reported: No symptoms reported    Integumentary Integumentary Symptoms Reported: No symptoms reported    Musculoskeletal  Musculoskeletal Conditions: Osteoarthritis Musculoskeletal Management Strategies: Adequate rest Musculoskeletal Comment: with OA at times knee pain and worsening gait, uses walker Falls in the past year?: Yes Number of falls in past year: 1 or less Was there an injury with Fall?: Yes Fall Risk Category Calculator: 2 Patient Fall Risk Level: Moderate Fall Risk Patient at Risk for Falls Due to: History of fall(s), Impaired balance/gait, Impaired mobility Fall risk Follow up: Falls evaluation completed  Psychosocial Psychosocial Symptoms Reported: No symptoms reported   Major Change/Loss/Stressor/Fears (CP): Denies Techniques to Cope with Loss/Stress/Change: Spiritual practice(s) Quality of Family Relationships: helpful, involved, supportive Do you feel physically threatened by others?: No      06/30/2023    1:55 PM  Depression screen PHQ 2/9  Decreased Interest 1  Down, Depressed, Hopeless 1  PHQ - 2 Score 2  Altered sleeping 0  Tired, decreased energy 1  Change in appetite 1  Feeling bad or failure about yourself  0  Trouble concentrating 0  Moving slowly or fidgety/restless 0  Suicidal thoughts 0  PHQ-9 Score 4  Difficult doing work/chores Somewhat difficult    There were no vitals filed for this visit.  Medications Reviewed Today     Reviewed by Gilberto Labella, RN (Registered Nurse) on 06/30/23 at 1333  Med List Status: <None>   Medication Order Taking? Sig Documenting Provider Last Dose Status Informant  amLODipine  (NORVASC ) 5 MG tablet [id number] Yes Take 1 tablet (5 mg total) by mouth at bedtime. Carie Charity, NP Taking Active   aspirin  EC 81 MG tablet [id number] Yes Take 1 tablet (81 mg total) by mouth daily. Mimi Alt, MD Taking Active Self, Pharmacy Records  B-D UF III MINI PEN NEEDLES 31G X 5 MM MISC [id number] Yes USE AS DIRECTED 3 TIMES A DAY Everhart, Kirstie, DO Taking Active   carvedilol  (COREG ) 12.5 MG tablet [id number] Yes Take 1.5 tablets (18.75  mg total) by mouth 2 (two) times daily with a meal. Eduard Grad, Chet Cota, NP Taking Active   Continuous Glucose Sensor (FREESTYLE LIBRE 3 SENSOR) [place] [id number] Yes Place 1 sensor on the skin every 14 days. Use to check glucose continuously Everhart, Kirstie, DO Taking Active   cyanocobalamin  ([employer]S VITAMIN B12) 1000 MCG tablet [id number] Yes Take 1 tablet (1,000 mcg total) by mouth daily. Everhart, Kirstie, DO Taking Active   Evolocumab  (REPATHA  SURECLICK) 140 MG/ML SOAJ [id number] Yes INJECT 140 MG INTO THE SKIN EVERY 14 (FOURTEEN) DAYS. [place], Peter M, MD Taking Active   ezetimibe  (ZETIA ) 10 MG tablet [id number] Yes TAKE 1 TABLET BY MOUTH EVERY DAY Cleaver, Chet Cota, NP Taking Active   ferrous sulfate  325 (65 FE) MG tablet [id number] Yes Take 1 tablet (325 mg total) by mouth daily with breakfast. Everhart, Kirstie, DO Taking Active   insulin  glargine (LANTUS  SOLOSTAR) 100 UNIT/ML Solostar Pen [id number] Yes Inject 28 Units into the skin daily. INJECT 34 UNITS INTO SKIN DAILY  Patient taking differently: Inject 28 Units into the skin daily. INJECT 34 UNITS INTO SKIN DAILY - CHANGED to 28   McDiarmid, Demetra Filter, MD Taking Active   insulin  lispro (HUMALOG  KWIKPEN) 100 UNIT/ML KwikPen [id number] Yes Inject 14-18 Units into the skin See admin instructions. Inject 18 units with first two meals of the day and inject 14 units with the last meal of the day. McDiarmid, Demetra Filter, MD Taking Active   metFORMIN  (GLUCOPHAGE ) 1000 MG tablet [id number] Yes TAKE 1 TABLET (1,000 MG TOTAL) BY MOUTH TWICE A DAY WITH FOOD Everhart,  Kirstie, DO Taking Active   olmesartan -hydrochlorothiazide  (BENICAR  HCT) 20-12.5 MG tablet [id number] Yes TAKE 1 TABLET BY MOUTH EVERY DAY Everhart, Kirstie, DO Taking Active   Omega-3 Fatty Acids (FISH OIL) 1000 MG CAPS [id number] Yes Take 1,000 mg by mouth daily. [provider] Taking Active Self, Pharmacy Records            Recommendation:   PCP Follow-up Continue Current Plan of  Care  Follow Up Plan:   Telephone follow-up in 1 month  Gilberto Labella, MSN, RN [facility]  [facility], [facility] RN Care Manager Direct Dial : [phone] Fax: [phone]

## 2023-07-01 ENCOUNTER — Other Ambulatory Visit: Payer: Self-pay | Admitting: Family Medicine

## 2023-07-06 DIAGNOSIS — G4733 Obstructive sleep apnea (adult) (pediatric): Secondary | ICD-10-CM | POA: Diagnosis not present

## 2023-07-08 DIAGNOSIS — G4733 Obstructive sleep apnea (adult) (pediatric): Secondary | ICD-10-CM | POA: Diagnosis not present

## 2023-07-09 ENCOUNTER — Ambulatory Visit: Admitting: Pharmacist

## 2023-07-22 ENCOUNTER — Other Ambulatory Visit: Payer: Self-pay | Admitting: Cardiology

## 2023-07-22 DIAGNOSIS — I25110 Atherosclerotic heart disease of native coronary artery with unstable angina pectoris: Secondary | ICD-10-CM

## 2023-07-22 DIAGNOSIS — E1169 Type 2 diabetes mellitus with other specified complication: Secondary | ICD-10-CM

## 2023-07-23 ENCOUNTER — Ambulatory Visit: Payer: 59

## 2023-07-23 VITALS — Ht 64.0 in | Wt 170.0 lb

## 2023-07-23 DIAGNOSIS — Z0000 Encounter for general adult medical examination without abnormal findings: Secondary | ICD-10-CM | POA: Diagnosis not present

## 2023-07-23 NOTE — Progress Notes (Signed)
 Because this visit was a virtual/telehealth visit,  certain criteria was not obtained, such a blood pressure, CBG if applicable, and timed get up and go. Any medications not marked as taking were not mentioned during the medication reconciliation part of the visit. Any vitals not documented were not able to be obtained due to this being a telehealth visit or patient was unable to self-report a recent blood pressure reading due to a lack of equipment at home via telehealth. Vitals that have been documented are verbally provided by the patient.   Subjective:   Holly Acosta is a 68 y.o. who presents for a Medicare Wellness preventive visit.  As a reminder, Annual Wellness Visits don't include a physical exam, and some assessments may be limited, especially if this visit is performed virtually. We may recommend an in-person follow-up visit with your provider if needed.  Visit Complete: Virtual I connected with  Ronal VEAR Bihari on 07/23/23 by a audio enabled telemedicine application and verified that I am speaking with the correct person using two identifiers.  Patient Location: Home  Provider Location: Office/Clinic  I discussed the limitations of evaluation and management by telemedicine. The patient expressed understanding and agreed to proceed.  Vital Signs: Because this visit was a virtual/telehealth visit, some criteria may be missing or patient reported. Any vitals not documented were not able to be obtained and vitals that have been documented are patient reported.  VideoDeclined- This patient declined Librarian, academic. Therefore the visit was completed with audio only.  Persons Participating in Visit: Patient.  AWV Questionnaire: No: Patient Medicare AWV questionnaire was not completed prior to this visit.  Cardiac Risk Factors include: advanced age (>7men, >38 women);diabetes mellitus;dyslipidemia;family history of premature cardiovascular  disease;hypertension;sedentary lifestyle     Objective:    Today's Vitals   07/23/23 1031  Weight: 170 lb (77.1 kg)  Height: 5' 4 (1.626 m)  PainSc: 0-No pain   Body mass index is 29.18 kg/m.     07/23/2023   10:36 AM 06/30/2023    1:59 PM 06/23/2023    3:48 PM 06/23/2023    1:56 PM 03/09/2023    4:10 PM 09/04/2022    2:01 PM 07/25/2022   11:46 AM  Advanced Directives  Does Patient Have a Medical Advance Directive? No No No No No No No  Would patient like information on creating a medical advance directive? No - Patient declined No - Patient declined No - Patient declined No - Patient declined No - Patient declined No - Patient declined Yes (MAU/Ambulatory/Procedural Areas - Information given)    Current Medications (verified) Outpatient Encounter Medications as of 07/23/2023  Medication Sig   amLODipine  (NORVASC ) 5 MG tablet Take 1 tablet (5 mg total) by mouth at bedtime.   aspirin  EC 81 MG tablet Take 1 tablet (81 mg total) by mouth daily.   B-D UF III MINI PEN NEEDLES 31G X 5 MM MISC USE AS DIRECTED 3 TIMES A DAY   carvedilol  (COREG ) 12.5 MG tablet Take 1.5 tablets (18.75 mg total) by mouth 2 (two) times daily with a meal.   Continuous Glucose Sensor (FREESTYLE LIBRE 3 SENSOR) MISC Place 1 sensor on the skin every 14 days. Use to check glucose continuously   cyanocobalamin  (CVS VITAMIN B12) 1000 MCG tablet Take 1 tablet (1,000 mcg total) by mouth daily.   Evolocumab  (REPATHA  SURECLICK) 140 MG/ML SOAJ INJECT 140 MG INTO THE SKIN EVERY 14 (FOURTEEN) DAYS.   ezetimibe  (ZETIA ) 10 MG  tablet TAKE 1 TABLET BY MOUTH EVERY DAY   ferrous sulfate  325 (65 FE) MG tablet Take 1 tablet (325 mg total) by mouth daily with breakfast.   insulin  glargine (LANTUS  SOLOSTAR) 100 UNIT/ML Solostar Pen Inject 28 Units into the skin daily. INJECT 34 UNITS INTO SKIN DAILY (Patient taking differently: Inject 28 Units into the skin daily. INJECT 34 UNITS INTO SKIN DAILY - CHANGED to 28)   insulin  lispro (HUMALOG   KWIKPEN) 100 UNIT/ML KwikPen Inject 14-18 Units into the skin See admin instructions. Inject 18 units with first two meals of the day and inject 14 units with the last meal of the day.   metFORMIN  (GLUCOPHAGE ) 1000 MG tablet TAKE 1 TABLET (1,000 MG TOTAL) BY MOUTH TWICE A DAY WITH FOOD   olmesartan -hydrochlorothiazide  (BENICAR  HCT) 20-12.5 MG tablet TAKE 1 TABLET BY MOUTH EVERY DAY   Omega-3 Fatty Acids (FISH OIL) 1000 MG CAPS Take 1,000 mg by mouth daily.   No facility-administered encounter medications on file as of 07/23/2023.    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) 04/29/2017   Acute respiratory failure with hypoxia (HCC)    AKI (acute kidney injury) (HCC)    Anemia    Arthritis of knee    both knees (04/24/2017)   Cataract    Cerebrovascular accident (CVA) due to occlusion of left posterior cerebral artery (HCC) 12/03/2017   Chest pain 04/24/2017   COLONIC POLYPS, HYPERPLASTIC 09/13/2009   Qualifier: Diagnosis of  By: Adella MD, Elizabeth     Constipation 06/02/2017   Coronary artery disease involving native coronary artery of native heart with unstable angina pectoris (HCC) 04/30/2017   Dehydration    Diabetes mellitus, type 2 (HCC)    Excessive daytime sleepiness 12/03/2017   History of blood transfusion    related to prolapsed uterus   HLD (hyperlipidemia)    HTN (hypertension)    Non-ST elevation (NSTEMI) myocardial infarction (HCC)    Non-ST elevation (NSTEMI) myocardial infarction (HCC)    Pneumonia due to COVID-19 virus 02/18/2019   Prolapsed uterus    Severe obstructive sleep apnea-hypopnea syndrome 01/01/2018   Sleep apnea    have mask; haven't worn it for a long time (04/24/2017)   Status post coronary artery stent placement    Umbilical hernia    Uterine  prolapse 01/20/2006   Qualifier: Diagnosis of   By: Adella MD, Almarie Parker hernia 01/26/2009   Qualifier: Diagnosis of   By: Adella MD, Almarie      Replacing diagnoses that were inactivated after the 04/21/22 regulatory import     Past Surgical History:  Procedure Laterality Date   CARDIAC CATHETERIZATION  2011   COLONOSCOPY     CORONARY STENT INTERVENTION N/A 04/27/2017   Procedure: CORONARY STENT INTERVENTION;  Surgeon: Dann Candyce RAMAN, MD;  Location: [facility] INVASIVE CV LAB;  Service: Cardiovascular;  Laterality: N/A;   LEFT HEART CATH AND CORONARY ANGIOGRAPHY N/A 04/27/2017   Procedure: LEFT HEART CATH AND CORONARY ANGIOGRAPHY;  Surgeon: Dann Candyce RAMAN, MD;  Location: [facility] INVASIVE CV LAB;  Service: Cardiovascular;  Laterality: N/A;   TUBAL LIGATION     Family History  Problem Relation Age of Onset   Diabetes Mother    Hypertension Mother    Hypertension Father    Colon cancer Neg Hx  Colon polyps Neg Hx    Esophageal cancer Neg Hx    Stomach cancer Neg Hx    Rectal cancer Neg Hx    Social History   Socioeconomic History   Marital status: Divorced    Spouse name: Not on file   Number of children: Not on file   Years of education: Not on file   Highest education level: Not on file  Occupational History   Not on file  Tobacco Use   Smoking status: Former    Current packs/day: 0.00    Average packs/day: 1 pack/day for 33.8 years (33.8 ttl pk-yrs)    Types: Cigarettes    Start date: 01/20/1973    Quit date: 11/04/2006    Years since quitting: 16.7   Smokeless tobacco: Never  Vaping Use   Vaping status: Never Used  Substance and Sexual Activity   Alcohol use: Never    Alcohol/week: 0.0 standard drinks of alcohol   Drug use: Never   Sexual activity: Not Currently  Other Topics Concern   Not on file  Social History Narrative   Lives with daughter Marcelle age 72) and 4 grandchildren.   Disability - Arthritis. Does get disability check.   Does  not have a living will yet   Social Drivers of Corporate investment banker Strain: Low Risk  (07/25/2022)   Overall Financial Resource Strain (CARDIA)    Difficulty of Paying Living Expenses: Not hard at all  Food Insecurity: No Food Insecurity (06/30/2023)   Hunger Vital Sign    Worried About Running Out of Food in the Last Year: Never true    Ran Out of Food in the Last Year: Never true  Transportation Needs: No Transportation Needs (06/30/2023)   PRAPARE - Administrator, Civil Service (Medical): No    Lack of Transportation (Non-Medical): No  Physical Activity: Inactive (07/25/2022)   Exercise Vital Sign    Days of Exercise per Week: 0 days    Minutes of Exercise per Session: 0 min  Stress: No Stress Concern Present (07/25/2022)   [employer] of Occupational Health - Occupational Stress Questionnaire    Feeling of Stress : Not at all  Social Connections: Moderately Isolated (07/25/2022)   Social Connection and Isolation Panel    Frequency of Communication with Friends and Family: More than three times a week    Frequency of Social Gatherings with Friends and Family: Three times a week    Attends Religious Services: More than 4 times per year    Active Member of Clubs or Organizations: No    Attends Banker Meetings: Never    Marital Status: Divorced    Tobacco Counseling Counseling given: Not Answered    Clinical Intake:  Pre-visit preparation completed: Yes  Pain : No/denies pain Pain Score: 0-No pain     BMI - recorded: 29.18 Nutritional Status: BMI 25 -29 Overweight Nutritional Risks: None Diabetes: Yes CBG done?: No Did pt. bring in CBG monitor from home?: No  Lab Results  Component Value Date   HGBA1C >15.0 06/23/2023   HGBA1C 12.3 (A) 03/09/2023   HGBA1C 10.2 (A) 09/04/2022     How often do you need to have someone help you when you read instructions, pamphlets, or other written materials from your doctor or pharmacy?: 1 -  Never  Interpreter Needed?: No  Information entered by :: Namiko Pritts N. Tonjia Parillo, LPN.   Activities of Daily Living     07/23/2023   10:36  AM 07/25/2022   11:45 AM  In your present state of health, do you have any difficulty performing the following activities:  Hearing? 1 0  Vision? 0 0  Difficulty concentrating or making decisions? 0 0  Walking or climbing stairs? 1 1  Dressing or bathing? 0 0  Doing errands, shopping? 0 1  Preparing Food and eating ? N N  Using the Toilet? N N  In the past six months, have you accidently leaked urine? N N  Do you have problems with loss of bowel control? N N  Managing your Medications? N N  Managing your Finances? N N  Housekeeping or managing your Housekeeping? N N    Patient Care Team: Stoney Blizzard, DO as PCP - General (Family Medicine) [place], Peter M, MD as PCP - Cardiology (Cardiology) Maree Lonni Inks, MD as Consulting Physician (Ophthalmology) Cary No, NP as Nurse Practitioner (Family Medicine) Weyman Corning, RN as VBCI Care Management  I have updated your Care Teams any recent Medical Services you may have received from other providers in the past year.     Assessment:   This is a routine wellness examination for [facility].  Hearing/Vision screen Hearing Screening - Comments:: Patient has hearing difficulties. Patient does not wear hearing aids. Vision Screening - Comments:: Wears reading glasses - up to date with routine eye exams with Lonni Maree, MD.    Goals Addressed             This Visit's Progress    07/23/2023: My goal is to be in better health.         Depression Screen     07/23/2023   10:37 AM 06/30/2023    1:55 PM 05/27/2023    3:27 PM 03/10/2023    9:50 AM 03/09/2023    4:38 PM 02/04/2023    9:22 AM 09/23/2022    9:38 AM  PHQ 2/9 Scores  PHQ - 2 Score 0 2 2 2 2  0 1  PHQ- 9 Score 0 4 5 5 5       Fall Risk     07/23/2023   10:33 AM 06/30/2023    1:51 PM 06/23/2023    1:56 PM 05/27/2023    3:25 PM  04/03/2023   11:29 AM  Fall Risk   Falls in the past year? 1 1 0 0 0  Number falls in past yr: 0 0 0  0  Injury with Fall? 0 1 0  0  Risk for fall due to : Impaired balance/gait History of fall(s);Impaired balance/gait;Impaired mobility No Fall Risks    Follow up Falls evaluation completed;Education provided Falls evaluation completed Falls evaluation completed      MEDICARE RISK AT HOME:  Medicare Risk at Home Any stairs in or around the home?: No If so, are there any without handrails?: No Home free of loose throw rugs in walkways, pet beds, electrical cords, etc?: Yes Adequate lighting in your home to reduce risk of falls?: Yes Life alert?: No Use of a cane, walker or w/c?: Yes Grab bars in the bathroom?: Yes Shower chair or bench in shower?: Yes Elevated toilet seat or a handicapped toilet?: Yes  TIMED UP AND GO:  Was the test performed?  No  Cognitive Function: 6CIT completed    07/23/2023   10:37 AM  MMSE - Mini Mental State Exam  Not completed: Unable to complete        07/25/2022   11:46 AM  6CIT Screen  What Year? 0 points  What month? 0 points  What time? 0 points  Count back from 20 0 points  Months in reverse 0 points  Repeat phrase 0 points  Total Score 0 points    Immunizations Immunization History  Administered Date(s) Administered   Influenza Split 11/04/2011   Influenza Whole 01/26/2009, 10/26/2009   Influenza,inj,Quad PF,6+ Mos 10/06/2013, 10/26/2014, 11/10/2017, 03/18/2019, 03/06/2020, 10/09/2020   Pneumococcal Conjugate-13 10/06/2013   Pneumococcal Polysaccharide-23 08/19/2007, 05/07/2017   Td 08/19/2007   Tdap 03/18/2019    Screening Tests Health Maintenance  Topic Date Due   COVID-19 Vaccine (1) Never done   Zoster Vaccines- Shingrix (1 of 2) Never done   FOOT EXAM  05/30/2023   Pneumococcal Vaccine: 50+ Years (4 of 4 - PCV20 or PCV21) 07/25/2023 (Originally 05/08/2022)   MAMMOGRAM  07/25/2023 (Originally 04/26/2022)   DEXA SCAN   07/25/2023 (Originally 08/26/2020)   INFLUENZA VACCINE  08/21/2023   OPHTHALMOLOGY EXAM  10/03/2023   HEMOGLOBIN A1C  12/23/2023   Diabetic kidney evaluation - Urine ACR  03/08/2024   Diabetic kidney evaluation - eGFR measurement  06/22/2024   Medicare Annual Wellness (AWV)  07/22/2024   DTaP/Tdap/Td (3 - Td or Tdap) 03/17/2029   Colonoscopy  04/12/2033   Hepatitis C Screening  Completed   Hepatitis B Vaccines  Aged Out   HPV VACCINES  Aged Out   Meningococcal B Vaccine  Aged Out    Health Maintenance  Health Maintenance Due  Topic Date Due   COVID-19 Vaccine (1) Never done   Zoster Vaccines- Shingrix (1 of 2) Never done   FOOT EXAM  05/30/2023   Health Maintenance Items Addressed: Yes Patient aware of current care gaps.  Immunization record was verified by [employer].  Patient is due for Diabetic Foot Exam, Shingrix and Covid Vaccines.  Additional Screening:  Vision Screening: Recommended annual ophthalmology exams for early detection of glaucoma and other disorders of the eye. Would you like a referral to an eye doctor? No    Dental Screening: Recommended annual dental exams for proper oral hygiene  Community Resource Referral / Chronic Care Management: CRR required this visit?  No   CCM required this visit?  No   Plan:    I have personally reviewed and noted the following in the patient's chart:   Medical and social history Use of alcohol, tobacco or illicit drugs  Current medications and supplements including opioid prescriptions. Patient is not currently taking opioid prescriptions. Functional ability and status Nutritional status Physical activity Advanced directives List of other physicians Hospitalizations, surgeries, and ER visits in previous 12 months Vitals Screenings to include cognitive, depression, and falls Referrals and appointments  In addition, I have reviewed and discussed with patient certain preventive protocols, quality metrics, and best practice  recommendations. A written personalized care plan for preventive services as well as general preventive health recommendations were provided to patient.   Roz LOISE Fuller, LPN   02/26/7972   After Visit Summary: (Declined) Due to this being a telephonic visit, with patients personalized plan was offered to patient but patient Declined AVS at this time   Notes: Patient aware of current care gaps.  Immunization record was verified by [employer].  Patient is due for Diabetic Foot Exam, Shingrix and Covid Vaccines.

## 2023-07-23 NOTE — Patient Instructions (Signed)
 Holly Acosta , Thank you for taking time out of your busy schedule to complete your Annual Wellness Visit with me. I enjoyed our conversation and look forward to speaking with you again next year. I, as well as your care team,  appreciate your ongoing commitment to your health goals. Please review the following plan we discussed and let me know if I can assist you in the future. Your Game plan/ To Do List    Referrals: If you haven't heard from the office you've been referred to, please reach out to them at the phone provided.   Follow up Visits: Next Medicare AWV with our clinical staff: 07/25/2024 10:30 a.m. phone visit with Nurse Health Advisor   Have you seen your provider in the last 6 months (3 months if uncontrolled diabetes)? Yes Next Office Visit with your provider: due in August 2025 for 3 month check up  Clinician Recommendations:  Aim for 30 minutes of exercise or brisk walking, 6-8 glasses of water, and 5 servings of fruits and vegetables each day.       This is a list of the screening recommended for you and due dates:  Health Maintenance  Topic Date Due   COVID-19 Vaccine (1) Never done   Zoster (Shingles) Vaccine (1 of 2) Never done   Complete foot exam   05/30/2023   Pneumococcal Vaccine for age over 67 (4 of 4 - PCV20 or PCV21) 07/25/2023*   Mammogram  07/25/2023*   DEXA scan (bone density measurement)  07/25/2023*   Flu Shot  08/21/2023   Eye exam for diabetics  10/03/2023   Hemoglobin A1C  12/23/2023   Yearly kidney health urinalysis for diabetes  03/08/2024   Yearly kidney function blood test for diabetes  06/22/2024   Medicare Annual Wellness Visit  07/22/2024   DTaP/Tdap/Td vaccine (3 - Td or Tdap) 03/17/2029   Colon Cancer Screening  04/12/2033   Hepatitis C Screening  Completed   Hepatitis B Vaccine  Aged Out   HPV Vaccine  Aged Out   Meningitis B Vaccine  Aged Out  *Topic was postponed. The date shown is not the original due date.    Advanced directives:  (Declined) Advance directive discussed with you today. Even though you declined this today, please call our office should you change your mind, and we can give you the proper paperwork for you to fill out. Advance Care Planning is important because it:  [x]  Makes sure you receive the medical care that is consistent with your values, goals, and preferences  [x]  It provides guidance to your family and loved ones and reduces their decisional burden about whether or not they are making the right decisions based on your wishes.  Follow the link provided in your after visit summary or read over the paperwork we have mailed to you to help you started getting your Advance Directives in place. If you need assistance in completing these, please reach out to us  so that we can help you!  See attachments for Preventive Care and Fall Prevention Tips.

## 2023-07-28 ENCOUNTER — Telehealth: Payer: Self-pay | Admitting: Pharmacist

## 2023-07-28 NOTE — Telephone Encounter (Signed)
 Reviewed and agree with Dr Macky Lower plan.

## 2023-07-28 NOTE — Telephone Encounter (Signed)
 Patient contacted for follow-up of Diabetes control and use of medications.   Since last contact patient reports she has been taking her insulin  shots.  She reports her glucose readings are in the 100-200 range sometimes but more often she is seeing glucose readings in the low 200s.   She feels like her glucose readings are doing much better.    Current Medications include: as reported by patient Lantus  (insulin  glargine) 28 units daily Humalog  (insuline lispro) 18 units with two meals per day  Patient denies any significant medication related side effects.  Medication Plan: - Continue current regimen - Reinforced need for adherence with insulin  therapy.  Encouraged not to miss doses.   Total time with patient call and documentation of interaction: 12 minutes.  Scheduled back with PCP, Dr. Stoney,  7/25 at 2:45 PM  F/U Phone call planned: None Note - if control remains suboptimal at next PCP visit, I would be happy to see her in pharmacist clinic 08/2023

## 2023-07-30 ENCOUNTER — Other Ambulatory Visit: Payer: Self-pay

## 2023-07-30 NOTE — Patient Instructions (Signed)
 Visit Information  Thank you for taking time to visit with me today. Please don't hesitate to contact me if I can be of assistance to you before our next scheduled appointment.  Your next care management appointment is a Telephone follow up appointment with care management team member scheduled for:  09/01/23  130 pm  with Rosaline Finlay  Please call the care guide team at [phone] if you need to cancel, schedule, or reschedule an appointment.   Please call the [place]  National Suicide Prevention Lifeline: 2690207399 or TTY: 872-233-9243 TTY 972-284-2745) to talk to a trained counselor call 1-800-273-TALK (toll free, 24 hour hotline) call 911 if you are experiencing a Mental Health or Behavioral Health Crisis or need someone to talk to.  Wilbert Diver RN, BSN, [facility]  [facility], [facility]    Care Coordinator Phone: [phone]

## 2023-07-30 NOTE — Patient Outreach (Signed)
 Complex Care Management   Visit Note  07/30/2023  Name:  Holly Acosta MRN: [id number] DOB: Aug 09, 1955  Situation: Referral received for Complex Care Management related to Diabetes with Complications and HTN I obtained verbal consent from Patient.  Visit completed with patient  on the phone  Background:   Past Medical History:  Diagnosis Date   Acute cerebrovascular accident (CVA) of cerebellum (HCC) 04/29/2017   Acute cerebrovascular accident (CVA) of cerebellum (HCC) 04/29/2017   Acute respiratory failure with hypoxia (HCC)    AKI (acute kidney injury) (HCC)    Anemia    Arthritis of knee    both knees (04/24/2017)   Cataract    Cerebrovascular accident (CVA) due to occlusion of left posterior cerebral artery (HCC) 12/03/2017   Chest pain 04/24/2017   COLONIC POLYPS, HYPERPLASTIC 09/13/2009   Qualifier: Diagnosis of  By: Adella MD, Elizabeth     Constipation 06/02/2017   Coronary artery disease involving native coronary artery of native heart with unstable angina pectoris (HCC) 04/30/2017   Dehydration    Diabetes mellitus, type 2 (HCC)    Excessive daytime sleepiness 12/03/2017   History of blood transfusion    related to prolapsed uterus   HLD (hyperlipidemia)    HTN (hypertension)    Non-ST elevation (NSTEMI) myocardial infarction (HCC)    Non-ST elevation (NSTEMI) myocardial infarction (HCC)    Pneumonia due to COVID-19 virus 02/18/2019   Prolapsed uterus    Severe obstructive sleep apnea-hypopnea syndrome 01/01/2018   Sleep apnea    have mask; haven't worn it for a long time (04/24/2017)   Status post coronary artery stent placement    Umbilical hernia    Uterine prolapse 01/20/2006   Qualifier: Diagnosis of   By: Adella MD, Almarie Parker hernia 01/26/2009   Qualifier: Diagnosis of   By: Adella MD, Almarie      Replacing diagnoses that were inactivated after the 04/21/22 regulatory import      Assessment: Patient Reported  Symptoms:  Cognitive Cognitive Status: Able to follow simple commands, Alert and oriented to person, place, and time, Normal speech and language skills      Neurological Neurological Review of Symptoms: No symptoms reported    HEENT HEENT Symptoms Reported: Change or loss of hearing HEENT Comment: Mild hearing loss will see about hearing aids    Cardiovascular Cardiovascular Symptoms Reported: Swelling in legs or feet Does patient have uncontrolled Hypertension?: Yes Is patient checking Blood Pressure at home?: No Patient's Recent BP reading at home: last bp on 6/3 151/92  went down to 144/107.  She does elevate her feet when she sits    Respiratory Respiratory Symptoms Reported: No symptoms reported    Endocrine Is patient diabetic?: Yes Is patient checking blood sugars at home?: Yes List most recent blood sugar readings, include date and time of day: varies 200 to 300 's Endocrine Comment: uses libre 3  Gastrointestinal Gastrointestinal Symptoms Reported: Diarrhea Additional Gastrointestinal Details: She has diarrhea once a week.  It usually retifies itself      Genitourinary Genitourinary Symptoms Reported: No symptoms reported    Integumentary Integumentary Symptoms Reported: No symptoms reported    Musculoskeletal Musculoskelatal Symptoms Reviewed: No symptoms reported   Falls in the past year?: Yes Number of falls in past year: 1 or less Was there an injury with Fall?: Yes Fall Risk Category Calculator: 2 Patient Fall Risk Level: Moderate Fall Risk Fall risk Follow up: Falls evaluation completed,  Education provided, Falls prevention discussed  Psychosocial       Quality of Family Relationships: involved, supportive Do you feel physically threatened by others?: No      07/30/2023    2:08 PM  Depression screen PHQ 2/9  Decreased Interest 1  Down, Depressed, Hopeless 1  PHQ - 2 Score 2  Altered sleeping 0  Tired, decreased energy 1  Change in appetite 2  Feeling  bad or failure about yourself  0  Trouble concentrating 0  Moving slowly or fidgety/restless 0  Suicidal thoughts 0  PHQ-9 Score 5    There were no vitals filed for this visit.  Medications Reviewed Today     Reviewed by Weyman Corning, RN (Registered Nurse) on 07/30/23 at 1339  Med List Status: <None>   Medication Order Taking? Sig Documenting Provider Last Dose Status Informant  amLODipine  (NORVASC ) 5 MG tablet [id number] Yes Take 1 tablet (5 mg total) by mouth at bedtime. Emelia Josefa HERO, NP  Active   aspirin  EC 81 MG tablet [id number] Yes Take 1 tablet (81 mg total) by mouth daily. Alto Lynwood SAUNDERS, MD  Active Self, Pharmacy Records  B-D UF III MINI PEN NEEDLES 31G X 5 MM MISC [id number] Yes USE AS DIRECTED 3 TIMES A DAY Everhart, Kirstie, DO  Active   carvedilol  (COREG ) 12.5 MG tablet [id number] Yes Take 1.5 tablets (18.75 mg total) by mouth 2 (two) times daily with a meal. Emelia, Josefa HERO, NP  Active   Continuous Glucose Sensor (FREESTYLE LIBRE 3 SENSOR) [place] [id number] Yes Place 1 sensor on the skin every 14 days. Use to check glucose continuously Everhart, Kirstie, DO  Active   cyanocobalamin  ([employer] VITAMIN B12) 1000 MCG tablet [id number] Yes Take 1 tablet (1,000 mcg total) by mouth daily. Everhart, Kirstie, DO  Active   Evolocumab  (REPATHA  SURECLICK) 140 MG/ML SOAJ [id number] Yes INJECT 140 MG INTO THE SKIN EVERY 14 (FOURTEEN) DAYS. [place], Peter M, MD  Active   ezetimibe  (ZETIA ) 10 MG tablet [id number] Yes TAKE 1 TABLET BY MOUTH EVERY DAY Emelia Josefa HERO, NP  Active   ferrous sulfate  325 (65 FE) MG tablet [id number] Yes Take 1 tablet (325 mg total) by mouth daily with breakfast. Everhart, Kirstie, DO  Active   insulin  glargine (LANTUS  SOLOSTAR) 100 UNIT/ML Solostar Pen [id number] Yes Inject 28 Units into the skin daily. INJECT 34 UNITS INTO SKIN DAILY McDiarmid, Krystal BIRCH, MD  Active   insulin  lispro (HUMALOG  KWIKPEN) 100 UNIT/ML KwikPen [id number] Yes Inject 14-18 Units into the skin See admin  instructions. Inject 18 units with first two meals of the day and inject 14 units with the last meal of the day. McDiarmid, Krystal BIRCH, MD  Active   metFORMIN  (GLUCOPHAGE ) 1000 MG tablet [id number] Yes TAKE 1 TABLET (1,000 MG TOTAL) BY MOUTH TWICE A DAY WITH FOOD Everhart, Kirstie, DO  Active   olmesartan -hydrochlorothiazide  (BENICAR  HCT) 20-12.5 MG tablet [id number] Yes TAKE 1 TABLET BY MOUTH EVERY DAY Everhart, Kirstie, DO  Active   Omega-3 Fatty Acids (FISH OIL) 1000 MG CAPS [id number] Yes Take 1,000 mg by mouth daily. [provider]  Active Self, Pharmacy Records            Recommendation:   PCP Follow-up  Follow Up Plan:   Telephone follow up appointment with care management team member scheduled for:  09/01/23  130 pm  with Rosaline Arno Corning Weyman RN, BSN, [facility]  [facility], [facility] Coordinator  Phone: [phone]

## 2023-08-04 ENCOUNTER — Other Ambulatory Visit: Payer: Self-pay | Admitting: Family Medicine

## 2023-08-04 DIAGNOSIS — E538 Deficiency of other specified B group vitamins: Secondary | ICD-10-CM

## 2023-08-04 DIAGNOSIS — G4733 Obstructive sleep apnea (adult) (pediatric): Secondary | ICD-10-CM | POA: Diagnosis not present

## 2023-08-07 DIAGNOSIS — G4733 Obstructive sleep apnea (adult) (pediatric): Secondary | ICD-10-CM | POA: Diagnosis not present

## 2023-08-14 ENCOUNTER — Ambulatory Visit: Admitting: Family Medicine

## 2023-08-14 NOTE — Progress Notes (Deleted)
    SUBJECTIVE:   CHIEF COMPLAINT / HPI:   Here for diabetes F/u Seen in ED 6/3 for hyperglycemia after not taking insulin , improved with insulin  and IVF Last A1C >15 06/2023  Current regimen: Lantus  28U daily Humalog  18U with two meals per day Metformin  1000mg  daily  Metformin  causing GI upset  Healthcare maintenance - Due for DEXA - Talk about going back to audiology for repeat testing +/- aids   PERTINENT  PMH / PSH: CAD, HTN, OSA on bipap, HLD, IDDM, CKD 3a  OBJECTIVE:   There were no vitals taken for this visit.  ***  ASSESSMENT/PLAN:   Assessment & Plan      Holly Prescott, DO [place] [facility]

## 2023-08-31 ENCOUNTER — Telehealth: Payer: Self-pay | Admitting: Pharmacist

## 2023-08-31 ENCOUNTER — Other Ambulatory Visit: Payer: Self-pay | Admitting: Family Medicine

## 2023-08-31 NOTE — Telephone Encounter (Signed)
 Patient contacted to schedule follow-up for diabetes management.  Since last contact patient reports doing well.  Current Medications include: Lantus  (insulin  glargine) 28 units daily Humalog  (insuline lispro) 18 units with two meals per day  Patient denies any significant medication related side effects.  Medication Plan: No changes at this time. Plan to discuss switch from [place] 3 to [place] 3+ at visit next week.  Total time with patient call and documentation of interaction: 6 minutes.  F/U scheduled for Thursday 09/10/23 2:00 pm

## 2023-09-01 ENCOUNTER — Telehealth: Payer: Self-pay

## 2023-09-01 NOTE — Telephone Encounter (Signed)
 Reviewed and agree with Dr Rennis plan.

## 2023-09-01 NOTE — Patient Outreach (Signed)
 Care Coordination   09/01/2023 Name: JERRIE SCHUSSLER MRN: [id number] DOB: 1955/02/15   Care Coordination Outreach Attempts:  An unsuccessful outreach was attempted for an appointment today.  Follow Up Plan:  Additional outreach attempts will be made to complete CCM follow-up visit.   Encounter Outcome:  No Answer   Rosaline Finlay, RN MSN [place]  [facility] RN Care Manager Direct Dial : [phone]  Fax: [phone]

## 2023-09-02 ENCOUNTER — Other Ambulatory Visit: Payer: Self-pay

## 2023-09-02 NOTE — Patient Outreach (Signed)
 Complex Care Management   Visit Note  09/02/2023  Name:  Holly Acosta MRN: [id number] DOB: [birth year]  Situation: Referral received for Complex Care Management related to Diabetes and HTN I obtained verbal consent from Patient.  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) 04/29/2017   Acute respiratory failure with hypoxia (HCC)    AKI (acute kidney injury) (HCC)    Anemia    Arthritis of knee    both knees (04/24/2017)   Cataract    Cerebrovascular accident (CVA) due to occlusion of left posterior cerebral artery (HCC) 12/03/2017   Chest pain 04/24/2017   COLONIC POLYPS, HYPERPLASTIC 09/13/2009   Qualifier: Diagnosis of  By: Adella MD, Elizabeth     Constipation 06/02/2017   Coronary artery disease involving native coronary artery of native heart with unstable angina pectoris (HCC) 04/30/2017   Dehydration    Diabetes mellitus, type 2 (HCC)    Excessive daytime sleepiness 12/03/2017   History of blood transfusion    related to prolapsed uterus   HLD (hyperlipidemia)    HTN (hypertension)    Non-ST elevation (NSTEMI) myocardial infarction (HCC)    Non-ST elevation (NSTEMI) myocardial infarction (HCC)    Pneumonia due to COVID-19 virus 02/18/2019   Prolapsed uterus    Severe obstructive sleep apnea-hypopnea syndrome 01/01/2018   Sleep apnea    have mask; haven't worn it for a long time (04/24/2017)   Status post coronary artery stent placement    Umbilical hernia    Uterine prolapse 01/20/2006   Qualifier: Diagnosis of   By: Adella MD, Almarie Parker hernia 01/26/2009   Qualifier: Diagnosis of   By: Adella MD, Almarie      Replacing diagnoses that were inactivated after the 04/21/22 regulatory import      Assessment: Patient Reported Symptoms:  Cognitive Cognitive  Status: Able to follow simple commands, Alert and oriented to person, place, and time, Normal speech and language skills Cognitive/Intellectual Conditions Management [RPT]: None reported or documented in medical history or problem list      Neurological Neurological Review of Symptoms: No symptoms reported    HEENT HEENT Symptoms Reported: Change or loss of hearing HEENT Comment: Mild hearing loss. Patient reports she has been screened for hearing aids but has not called to get the hearing aids ordered. Ear problem(s)  Cardiovascular Cardiovascular Symptoms Reported: No symptoms reported Does patient have uncontrolled Hypertension?: Yes Is patient checking Blood Pressure at home?: No (Patient does not have BP cuff. She plans to check with insurane to see if she has an OTC benefit) Cardiovascular Management Strategies: Medication therapy  Respiratory Respiratory Symptoms Reported: Not assesed    Endocrine Endocrine Symptoms Reported: No symptoms reported Is patient diabetic?: Yes Is patient checking blood sugars at home?: Yes List most recent blood sugar readings, include date and time of day: FreeStyle Libre. Patient reports her sugars are improved, showing lower 200's fasting    Gastrointestinal Gastrointestinal Symptoms Reported: No symptoms reported      Genitourinary Genitourinary Symptoms Reported: No symptoms reported    Integumentary      Musculoskeletal Musculoskelatal Symptoms Reviewed: No symptoms reported Musculoskeletal Management Strategies: Medical device (Walker and cane) Falls in the past year?: Yes Number of falls in past year: 1 or less (No falls since previous  CMRN visit) Was there an injury with Fall?: Yes Fall Risk Category Calculator: 2 Patient Fall Risk Level: Moderate Fall Risk Patient at Risk for Falls Due to: History of fall(s), Impaired balance/gait Fall risk Follow up: Falls evaluation completed, Education provided, Falls prevention discussed   Psychosocial Psychosocial Symptoms Reported: No symptoms reported            07/30/2023    2:08 PM  Depression screen PHQ 2/9  Decreased Interest 1  Down, Depressed, Hopeless 1  PHQ - 2 Score 2  Altered sleeping 0  Tired, decreased energy 1  Change in appetite 2  Feeling bad or failure about yourself  0  Trouble concentrating 0  Moving slowly or fidgety/restless 0  Suicidal thoughts 0  PHQ-9 Score 5    There were no vitals filed for this visit.  Medications Reviewed Today   Medications were not reviewed in this encounter     Recommendation:   PCP Follow-up. Patient will call to reschedule missed appointment Continue Current Plan of Care Patient will check with insurance to ask about OTC benefit for BP monitor  Follow Up Plan:   Telephone follow up appointment date/time:  09/30/23 at 2 PM  Rosaline Finlay, RN MSN [place]  [facility] RN Care Manager Direct Dial : [phone]  Fax: [phone]

## 2023-09-02 NOTE — Patient Instructions (Signed)
 Visit Information  Thank you for taking time to visit with me today. Please don't hesitate to contact me if I can be of assistance to you before our next scheduled appointment.  Your next care management appointment is by telephone on 09/30/23 at 2 PM  Please remember to call your primary provider's office to reschedule missed visit!  Please call the care guide team at [phone] if you need to cancel, schedule, or reschedule an appointment.   Please call the Suicide and Crisis Lifeline: 988 call 1-800-273-TALK (toll free, 24 hour hotline) if you are experiencing a Mental Health or Behavioral Health Crisis or need someone to talk to.  Rosaline Finlay, RN MSN [place]  [facility] RN Care Manager Direct Dial : [phone]  Fax: [phone]

## 2023-09-07 DIAGNOSIS — G4733 Obstructive sleep apnea (adult) (pediatric): Secondary | ICD-10-CM | POA: Diagnosis not present

## 2023-09-09 DIAGNOSIS — E113211 Type 2 diabetes mellitus with mild nonproliferative diabetic retinopathy with macular edema, right eye: Secondary | ICD-10-CM | POA: Diagnosis not present

## 2023-09-09 LAB — HM DIABETES EYE EXAM

## 2023-09-10 ENCOUNTER — Encounter: Payer: Self-pay | Admitting: Pharmacist

## 2023-09-10 ENCOUNTER — Ambulatory Visit: Payer: Self-pay | Admitting: Family Medicine

## 2023-09-10 ENCOUNTER — Ambulatory Visit (INDEPENDENT_AMBULATORY_CARE_PROVIDER_SITE_OTHER): Admitting: Pharmacist

## 2023-09-10 VITALS — BP 127/71 | HR 82 | Wt 176.4 lb

## 2023-09-10 DIAGNOSIS — E119 Type 2 diabetes mellitus without complications: Secondary | ICD-10-CM | POA: Diagnosis not present

## 2023-09-10 DIAGNOSIS — E1159 Type 2 diabetes mellitus with other circulatory complications: Secondary | ICD-10-CM | POA: Diagnosis not present

## 2023-09-10 DIAGNOSIS — Z794 Long term (current) use of insulin: Secondary | ICD-10-CM

## 2023-09-10 DIAGNOSIS — I152 Hypertension secondary to endocrine disorders: Secondary | ICD-10-CM | POA: Diagnosis not present

## 2023-09-10 DIAGNOSIS — E1169 Type 2 diabetes mellitus with other specified complication: Secondary | ICD-10-CM

## 2023-09-10 DIAGNOSIS — E1165 Type 2 diabetes mellitus with hyperglycemia: Secondary | ICD-10-CM

## 2023-09-10 DIAGNOSIS — E785 Hyperlipidemia, unspecified: Secondary | ICD-10-CM

## 2023-09-10 MED ORDER — FREESTYLE LIBRE 3 PLUS SENSOR MISC: 11 refills | Status: DC

## 2023-09-10 MED ORDER — INSULIN LISPRO (1 UNIT DIAL) 100 UNIT/ML (KWIKPEN): 14.0000 [IU] | PEN_INJECTOR | SUBCUTANEOUS | Status: DC

## 2023-09-10 MED ORDER — LANTUS SOLOSTAR 100 UNIT/ML ~~LOC~~ SOPN: 32.0000 [IU] | PEN_INJECTOR | Freq: Every day | SUBCUTANEOUS | Status: DC

## 2023-09-10 MED ORDER — METFORMIN HCL ER 500 MG PO TB24: 500.0000 mg | ORAL_TABLET | Freq: Every day | ORAL | 1 refills | Status: DC

## 2023-09-10 NOTE — Assessment & Plan Note (Signed)
 ASCVD risk - secondary prevention in patient with diabetes with intolerance to atorvastatin . Last LDL 104 mg/dL on 2/74/75 not at goal of <70 mg/dL. Reports adherence to Repatha  and ezetimibe . Control likely suboptimal due to regimen, will consider stacking nonstatin for additional LDL-lowering and risk reduction of CVE. -Continue Repatha  (evolocumab ) 140 mg every 2 weeks. -Continue ezetimibe  10 mg daily.  -Order lipid panel for next visit 10/01/23 to evaluate need for additional nonstatin therapy.

## 2023-09-10 NOTE — Assessment & Plan Note (Signed)
 Hypertension longstanding currently controlled. Blood Pressure today 127/71 mmHg with a goal of <130/80 mmHg. Medication adherence appears good. Notably, patient has prescriptions for both amlodipine  5 mg and 10 mg. Reports she was on 10 mg in the Spring and at some point was told to take 1/2 tablet (5 mg), but in unsure what dose she takes at home. Amlodipine  10 mg was filled most recently. Since pressure is controlled will plan to continue whichever dose she's been taking at home and dispose of the other strength. -Advised patient to continue to take amlodipine  dose that she has been taking at home. -Advised patient to bring medication bottles to next visit to confirm medication reconciliation.  -Continue carvedilol  18.75 mg BIDac and olmesartan /hydrochlorothiazide  20/12.5 mg daily.

## 2023-09-10 NOTE — Patient Instructions (Addendum)
 It was nice to see you today!   Your goal blood sugar is 80-130 before eating and less than 180 after eating.  Medication Changes: START Tylenol  (acetaminophen ) 8 hour 650 mg 1 tab in morning and 1 tab at bedtime.  STOP metformin  1000 mg tablet twice daily. START metformin  XR 500 mg 1 tab daily.  INCREASE Lantus  (insulin  glargine) to 32 units daily.  CHANGE Humalog  (insuline lispro) to 14-20 units twice a day before meals. If you are eating a larger meal try using 18-20 units. If you are having a smaller meal, try using 14-16 units.  Continue all other medication the same.   Please bring all of your medications with you at next visit on 10/01/23 at 3:30 pm.  Keep up the good work with diet and exercise. Aim for a diet full of vegetables, fruit and lean meats (chicken, [place], fish). Try to limit salt intake by eating fresh or frozen vegetables (instead of canned), rinse canned vegetables prior to cooking and do not add any additional salt to meals.

## 2023-09-10 NOTE — Assessment & Plan Note (Signed)
 Diabetes longstanding 5+ years currently uncontrolled but improving with last A1c >15% on 06/23/23 and today GMI 9.6%. Patient is able to verbalize appropriate hypoglycemia management plan. Medication adherence appears good. Control is suboptimal due to dietary habits and regimen. Patient reports stomach pain and GI distress with metformin  warranting a dose decrease trial to assess tolerability. Last UACR 875 mg/g on 03/09/23, plan to initiate SGLT2i at next visit for renal protective and glucose-lowering benefits. -Increased basal insulin  Lantus  (insulin  glargine) from 28 units to 32 units daily in the morning. -Increased bolus insulin  Humalog  (insulin  lispro) from 18 units BIDac to 14-20 units BIDac. Advised patient to use 14-16 units for smaller meals and 18-20 units for larger meals.  -Stopped metformin  1000 mg BID due to GI upset. Started metformin  XR 500 mg once daily with food. Plan to reevaluate tolerability at next visit. -Sent in new prescription for [facility] 3 plus sensors. -Patient educated on purpose, proper use, and potential adverse effects. -Extensively discussed pathophysiology of diabetes, recommended lifestyle interventions, dietary effects on blood sugar control.  -Counseled on s/sx of and management of hypoglycemia.  -Order A1c for next visit 10/01/23.

## 2023-09-10 NOTE — Progress Notes (Signed)
 S:     Chief Complaint  Patient presents with   Medication Management    DM f/u   68 y.o. female who presents for diabetes evaluation, education, and management. Patient arrives in good spirits and presents with assistance of a walker.   Patient was referred and last seen by Primary Care Provider, Dr. Stoney, on 06/23/23.  At last visit, patient was sent to emergency department for further evaluation of hyperglycemia.   PMH is significant for T2DM, HTN, HLD, CKD, difficulty hearing.  Patient reports Diabetes was diagnosed 5+ years ago.   Current diabetes medications include: Lantus  (insulin  glargine) 28 units daily, Humalog  (insuline lispro) 18 units BIDac, metformin  1000 mg BIDac Current hypertension medications include: amlodipine  5 mg OR 10 mg (fill history of both strengths, unsure which strength she's been taking), carvedilol  18.75 mg BIDac, olmesartan /hydrochlorothiazide  20/12.5 mg daily Current hyperlipidemia medications include: Repatha  (evolocumab ) every 2 weeks (on Mondays), ezetimibe  10 mg daily, Omega-3 fatty acids 1000 mg daily  Patient reports adherence to taking all medications as prescribed.   Do you feel that your medications are working for you? yes Have you been experiencing any side effects to the medications prescribed? Yes. Reports metformin  has been making her sick to her stomach, burning sensation. Insurance coverage: [employer] and Medicaid  Patient denies hypoglycemic events. Reports lowest glucose in 120s.  Patient denies nocturia (nighttime urination).  Patient denies neuropathy (nerve pain). Patient denies visual changes. Patient denies self foot exams.   Patient reported dietary habits: Eats 2-3 meals/day. Dinner is largest meal of day. Breakfast: eggs, bacon, oatmeal Lunch: popcorn Dinner: chicken, rice, potatoes Snacks: sweets (cake) but only once in awhile Drinks: black coffee (has cut out cream), powerade  (sugar-free)  Physical activity: limited due to knee pain  Reports pain 9/10 both knees.  O:   Review of Systems  HENT:  Positive for hearing loss.   Gastrointestinal:  Positive for abdominal pain.  Musculoskeletal:  Positive for joint pain (both knees).  All other systems reviewed and are negative.   Physical Exam Constitutional:      Appearance: Normal appearance.  Pulmonary:     Effort: Pulmonary effort is normal.  Neurological:     Mental Status: She is alert.  Psychiatric:        Mood and Affect: Mood normal.        Behavior: Behavior normal.        Thought Content: Thought content normal.        Judgment: Judgment normal.    Libre3 CGM Download today 09/10/23 % Time CGM is active: 94% Average Glucose: 263 mg/dL Glucose Management Indicator: 9.6%  Glucose Variability: 33.8% (goal <36%) Time in Goal:  - Time in range 70-180: 20% - Time above range: 80% - Time below range: 0% Observed patterns: highs later in the day  Lab Results  Component Value Date   HGBA1C >15.0 06/23/2023   Vitals:   09/10/23 1420  BP: 127/71  Pulse: 82  SpO2: 96%    Lipid Panel     Component Value Date/Time   CHOL 206 (H) 08/14/2022 1141   TRIG 404 (H) 08/14/2022 1141   HDL 33 (L) 08/14/2022 1141   CHOLHDL 6.2 (H) 08/14/2022 1141   CHOLHDL 5.1 04/29/2017 0557   VLDL 32 04/29/2017 0557   LDLCALC 104 (H) 08/14/2022 1141   LDLDIRECT 135 (H) 12/31/2012 0949   Clinical Atherosclerotic Cardiovascular Disease (ASCVD): Yes   Patient is participating in a Managed Medicaid Plan:  Yes   A/P: Diabetes longstanding 5+ years currently uncontrolled but improving with last A1c >15% on 06/23/23 and today GMI 9.6%. Patient is able to verbalize appropriate hypoglycemia management plan. Medication adherence appears good. Control is suboptimal due to dietary habits and regimen. Patient reports stomach pain and GI distress with metformin  warranting a dose decrease trial to assess tolerability. Last  UACR 875 mg/g on 03/09/23, plan to initiate SGLT2i at next visit for renal protective and glucose-lowering benefits. -Increased basal insulin  Lantus  (insulin  glargine) from 28 units to 32 units daily in the morning. -Increased bolus insulin  Humalog  (insulin  lispro) from 18 units BIDac to 14-20 units BIDac. Advised patient to use 14-16 units for smaller meals and 18-20 units for larger meals.  -Stopped metformin  1000 mg BID due to GI upset. Started metformin  XR 500 mg once daily with food. Plan to reevaluate tolerability at next visit. -Sent in new prescription for [facility] 3 plus sensors. -Patient educated on purpose, proper use, and potential adverse effects. -Extensively discussed pathophysiology of diabetes, recommended lifestyle interventions, dietary effects on blood sugar control.  -Counseled on s/sx of and management of hypoglycemia.  -Order A1c for next visit 10/01/23.  ASCVD risk - secondary prevention in patient with diabetes with intolerance to atorvastatin . Last LDL 104 mg/dL on 2/74/75 not at goal of <70 mg/dL. Reports adherence to Repatha  and ezetimibe . Control likely suboptimal due to regimen, will consider stacking nonstatin for additional LDL-lowering and risk reduction of CVE. -Continue Repatha  (evolocumab ) 140 mg every 2 weeks. -Continue ezetimibe  10 mg daily.  -Order lipid panel for next visit 10/01/23 to evaluate need for additional nonstatin therapy.  Hypertension longstanding currently controlled. Blood Pressure today 127/71 mmHg with a goal of <130/80 mmHg. Medication adherence appears good. Notably, patient has prescriptions for both amlodipine  5 mg and 10 mg. Reports she was on 10 mg in the Spring and at some point was told to take 1/2 tablet (5 mg), but in unsure what dose she takes at home. Amlodipine  10 mg was filled most recently. Since pressure is controlled will plan to continue whichever dose she's been taking at home and dispose of the other strength. -Advised  patient to continue to take amlodipine  dose that she has been taking at home. -Advised patient to bring medication bottles to next visit to confirm medication reconciliation.  -Continue carvedilol  18.75 mg BIDac and olmesartan /hydrochlorothiazide  20/12.5 mg daily.  Knee pain uncontrolled on no medication. Recommended patient start taking OTC Tylenol  (acetaminophen ) 8-hr 650 mg 1 tab in the morning and 1 tab at bedtime.  Written patient instructions provided. Patient verbalized understanding of treatment plan.  Total time in face to face counseling 26 minutes.    Follow-up:  Pharmacist 10/01/23 at 3:30 pm PCP clinic visit 09/22/23 Patient seen with Fonda Blase, PharmD Candidate - PY3 student and Calton Nash, PharmD Candidate - PY4 student.

## 2023-09-11 NOTE — Progress Notes (Signed)
 Reviewed and agree with Dr Rennis plan.

## 2023-09-21 DIAGNOSIS — G4733 Obstructive sleep apnea (adult) (pediatric): Secondary | ICD-10-CM | POA: Diagnosis not present

## 2023-09-22 ENCOUNTER — Ambulatory Visit: Admitting: Family Medicine

## 2023-09-22 VITALS — BP 124/68 | HR 68 | Ht 65.5 in | Wt 175.4 lb

## 2023-09-22 DIAGNOSIS — E119 Type 2 diabetes mellitus without complications: Secondary | ICD-10-CM | POA: Diagnosis not present

## 2023-09-22 DIAGNOSIS — E1165 Type 2 diabetes mellitus with hyperglycemia: Secondary | ICD-10-CM

## 2023-09-22 DIAGNOSIS — Z794 Long term (current) use of insulin: Secondary | ICD-10-CM | POA: Diagnosis not present

## 2023-09-22 DIAGNOSIS — Z13820 Encounter for screening for osteoporosis: Secondary | ICD-10-CM

## 2023-09-22 LAB — POCT GLYCOSYLATED HEMOGLOBIN (HGB A1C): HbA1c, POC (controlled diabetic range): 11.8 % — AB (ref 0.0–7.0)

## 2023-09-22 MED ORDER — EMPAGLIFLOZIN 10 MG PO TABS
10.0000 mg | ORAL_TABLET | Freq: Every day | ORAL | 3 refills | Status: DC
Start: 2023-09-22 — End: 2023-12-03

## 2023-09-22 MED ORDER — AMLODIPINE BESYLATE 5 MG PO TABS: 5.0000 mg | ORAL_TABLET | Freq: Every day | ORAL | 12 refills | Status: DC

## 2023-09-22 MED ORDER — METFORMIN HCL ER 500 MG PO TB24: 500.0000 mg | ORAL_TABLET | Freq: Every day | ORAL | 1 refills | Status: DC

## 2023-09-22 NOTE — Progress Notes (Signed)
    SUBJECTIVE:   CHIEF COMPLAINT / HPI:   Saw Dr. Koval 09/10/23 Last A1C >15% on 06/23/23 Increased Lantus  from 28 to 32U daily in the morning Increased Humalog  from 18U BIDac to 20U BIDac Metformin  XR 500mg  once daily with food - tolerating this better with less side effects  GMI 9.0% per libre  Glucose 127 this morning fasting Has been taking 20U novolog  with meals  Pt clarifies she is taking amlodipine  5mg   PERTINENT  PMH / PSH: CAD, HTN, IDDM2, HLD, CKD3a  OBJECTIVE:   BP 124/68   Pulse 68   Ht 5' 5.5 (1.664 m)   Wt 175 lb 6.4 oz (79.6 kg)   SpO2 97%   BMI 28.74 kg/m   General: well appearing, NAD Respiratory: normal work of breathing on RA Ambulates independently with rolling walker  ASSESSMENT/PLAN:   Assessment & Plan Uncontrolled type 2 diabetes mellitus with hyperglycemia (HCC) A1C 11.8 today, slightly improved from 12.3 GMI 9% Will start Jardiance  10mg  today Side effects discussed Consider adjusting mealtime insulin  at next visit F/u 1 month Screening for osteoporosis DEXA ordered today     Elyce Prescott, DO  [facility]

## 2023-09-22 NOTE — Patient Instructions (Signed)
 Good to see you today - Thank you for coming in  Things we discussed today: We have started Jardiance  because your sugars have been better controlled - if you have any negative side effects please call me.    Please always bring your medication bottles

## 2023-09-30 ENCOUNTER — Other Ambulatory Visit: Payer: Self-pay

## 2023-09-30 NOTE — Patient Outreach (Signed)
 Complex Care Management   Visit Note  09/30/2023  Name:  Holly Acosta MRN: [id number] DOB: September 28, 1955  Situation: Referral received for Complex Care Management related to Diabetes with Complications and HTN I obtained verbal consent from Patient.  Visit completed with Patient  on the phone  Background:   Past Medical History:  Diagnosis Date   Acute cerebrovascular accident (CVA) of cerebellum (HCC) 04/29/2017   Acute cerebrovascular accident (CVA) of cerebellum (HCC) 04/29/2017   Acute respiratory failure with hypoxia (HCC)    AKI (acute kidney injury) (HCC)    Anemia    Arthritis of knee    both knees (04/24/2017)   Cataract    Cerebrovascular accident (CVA) due to occlusion of left posterior cerebral artery (HCC) 12/03/2017   Chest pain 04/24/2017   COLONIC POLYPS, HYPERPLASTIC 09/13/2009   Qualifier: Diagnosis of  By: Adella MD, Elizabeth     Constipation 06/02/2017   Coronary artery disease involving native coronary artery of native heart with unstable angina pectoris (HCC) 04/30/2017   Dehydration    Diabetes mellitus, type 2 (HCC)    Excessive daytime sleepiness 12/03/2017   History of blood transfusion    related to prolapsed uterus   HLD (hyperlipidemia)    HTN (hypertension)    Non-ST elevation (NSTEMI) myocardial infarction (HCC)    Non-ST elevation (NSTEMI) myocardial infarction (HCC)    Pneumonia due to COVID-19 virus 02/18/2019   Prolapsed uterus    Severe obstructive sleep apnea-hypopnea syndrome 01/01/2018   Sleep apnea    have mask; haven't worn it for a long time (04/24/2017)   Status post coronary artery stent placement    Umbilical hernia    Uterine prolapse 01/20/2006   Qualifier: Diagnosis of   By: Adella MD, Almarie Parker hernia 01/26/2009   Qualifier: Diagnosis of   By: Adella MD, Almarie      Replacing diagnoses that were inactivated after the 04/21/22 regulatory import      Assessment: Patient Reported  Symptoms:  Cognitive Cognitive Status: Able to follow simple commands, Alert and oriented to person, place, and time, Normal speech and language skills Cognitive/Intellectual Conditions Management [RPT]: None reported or documented in medical history or problem list      Neurological Neurological Review of Symptoms: Not assessed    HEENT HEENT Symptoms Reported: Not assessed      Cardiovascular Cardiovascular Symptoms Reported: No symptoms reported Does patient have uncontrolled Hypertension?: Yes Is patient checking Blood Pressure at home?: No (Patient reports she forgot to contact her insurance to ask about OTC benefit for a BP monitor. Advised to do so.) Cardiovascular Management Strategies: Medication therapy  Respiratory Respiratory Symptoms Reported: No symptoms reported Respiratory Management Strategies: BiPAP (BiPAP at nighttime)  Endocrine Endocrine Symptoms Reported: No symptoms reported Is patient diabetic?: Yes Is patient checking blood sugars at home?: Yes List most recent blood sugar readings, include date and time of day: FreeStyle Libre. 170 something this morning Endocrine Comment: Patient has had visits with pharmacist and PCP since preivous CMRN visit. Patient reports symptoms of stomach upset have resolved since decreasing dose of metformin  as advised by provider.  Gastrointestinal Gastrointestinal Symptoms Reported: Constipation Additional Gastrointestinal Details: Patient reports she has had some issues with constipation, so she has started taking Miralax . She has had one dose so far. Last BM a couple of days ago. Gastrointestinal Management Strategies: Medication therapy    Genitourinary Genitourinary Symptoms Reported: Not assessed    Integumentary Integumentary Symptoms  Reported: Not assessed    Musculoskeletal Musculoskelatal Symptoms Reviewed: No symptoms reported Musculoskeletal Management Strategies: Medical device (Walker and cane) Musculoskeletal  Comment: Note per chart review that DEXA scan was ordered by PCP. Has not been scheduled yet per patient. Falls in the past year?: Yes Number of falls in past year: 1 or less (No falls since previous CMRN visit) Was there an injury with Fall?: No Fall Risk Category Calculator: 1 Patient Fall Risk Level: Low Fall Risk Patient at Risk for Falls Due to: History of fall(s), Impaired balance/gait Fall risk Follow up: Falls evaluation completed  Psychosocial Psychosocial Symptoms Reported: Not assessed          09/30/2023    PHQ2-9 Depression Screening   Little interest or pleasure in doing things    Feeling down, depressed, or hopeless    PHQ-2 - Total Score    Trouble falling or staying asleep, or sleeping too much    Feeling tired or having little energy    Poor appetite or overeating     Feeling bad about yourself - or that you are a failure or have let yourself or your family down    Trouble concentrating on things, such as reading the newspaper or watching television    Moving or speaking so slowly that other people could have noticed.  Or the opposite - being so fidgety or restless that you have been moving around a lot more than usual    Thoughts that you would be better off dead, or hurting yourself in some way    PHQ2-9 Total Score    If you checked off any problems, how difficult have these problems made it for you to do your work, take care of things at home, or get along with other people    Depression Interventions/Treatment      There were no vitals filed for this visit.  Medications Reviewed Today     Reviewed by Arno Rosaline SQUIBB, RN (Registered Nurse) on 09/30/23 at 1402  Med List Status: <None>   Medication Order Taking? Sig Documenting Provider Last Dose Status Informant  amLODipine  (NORVASC ) 5 MG tablet [id number]  Take 1 tablet (5 mg total) by mouth at bedtime. Everhart, Kirstie, DO  Active   aspirin  EC 81 MG tablet [id number]  Take 1 tablet (81 mg total) by  mouth daily. Alto Lynwood SAUNDERS, MD  Active Self, Pharmacy Records  BD PEN NEEDLE MINI ULTRAFINE 31G X 5 MM MISC [id number]  USE AS DIRECTED 3 TIMES A DAY Everhart, Kirstie, DO  Active   carvedilol  (COREG ) 12.5 MG tablet [id number]  Take 1.5 tablets (18.75 mg total) by mouth 2 (two) times daily with a meal. Cleaver, Josefa HERO, NP  Active   Continuous Glucose Sensor (FREESTYLE LIBRE 3 PLUS SENSOR) MISC [id number] Yes Change sensor every 15 days. McDiarmid, Krystal BIRCH, MD  Active   [employer] VITAMIN B12 1000 MCG tablet [id number]  TAKE 1 TABLET BY MOUTH EVERY DAY Everhart, Kirstie, DO  Active   empagliflozin  (JARDIANCE ) 10 MG TABS tablet [id number] Yes Take 1 tablet (10 mg total) by mouth daily. Everhart, Kirstie, DO  Active   Evolocumab  (REPATHA  SURECLICK) 140 MG/ML SOAJ [id number]  INJECT 140 MG INTO THE SKIN EVERY 14 (FOURTEEN) DAYS. [place], Peter M, MD  Active   ezetimibe  (ZETIA ) 10 MG tablet [id number]  TAKE 1 TABLET BY MOUTH EVERY DAY Emelia Josefa HERO, NP  Active   ferrous sulfate  325 (65 FE) MG tablet [id number]  Take 1 tablet (325  mg total) by mouth daily with breakfast. Everhart, Kirstie, DO  Active   insulin  glargine (LANTUS  SOLOSTAR) 100 UNIT/ML Solostar Pen [id number] Yes Inject 32 Units into the skin daily. INJECT 34 UNITS INTO SKIN DAILY McDiarmid, Krystal BIRCH, MD  Active   insulin  lispro (HUMALOG  KWIKPEN) 100 UNIT/ML KwikPen [id number] Yes Inject 14-20 Units into the skin See admin instructions. Inject 14-20 units twice daily before meals. Use 14 units if having a smaller meal, use 20 units if having a larger meal. McDiarmid, Krystal BIRCH, MD  Active   metFORMIN  (GLUCOPHAGE -XR) 500 MG 24 hr tablet [id number] Yes Take 1 tablet (500 mg total) by mouth daily with breakfast. Everhart, Kirstie, DO  Active   olmesartan -hydrochlorothiazide  (BENICAR  HCT) 20-12.5 MG tablet [id number]  TAKE 1 TABLET BY MOUTH EVERY DAY Everhart, Kirstie, DO  Active   Omega-3 Fatty Acids (FISH OIL) 1000 MG CAPS [id number]  Take 1,000 mg by mouth daily.  [provider]  Active Self, Pharmacy Records            Recommendation:   PCP Follow-up Continue Current Plan of Care  Follow Up Plan:   Telephone follow up appointment date/time:  10/28/23 at 2 PM  Rosaline Finlay, RN MSN [place]  [facility] RN Care Manager Direct Dial : [phone]  Fax: [phone]

## 2023-09-30 NOTE — Patient Instructions (Signed)
 Visit Information  Thank you for taking time to visit with me today. Please don't hesitate to contact me if I can be of assistance to you before our next scheduled appointment.  Your next care management appointment is by telephone on 10/28/23 at 2 PM  Please call the care guide team at [phone] if you need to cancel, schedule, or reschedule an appointment.   Please call the Suicide and Crisis Lifeline: 988 call 1-800-273-TALK (toll free, 24 hour hotline) if you are experiencing a Mental Health or Behavioral Health Crisis or need someone to talk to.  Rosaline Finlay, RN MSN [place]  [facility] RN Care Manager Direct Dial : [phone]  Fax: [phone]

## 2023-10-01 ENCOUNTER — Ambulatory Visit (INDEPENDENT_AMBULATORY_CARE_PROVIDER_SITE_OTHER): Admitting: Pharmacist

## 2023-10-01 ENCOUNTER — Encounter: Payer: Self-pay | Admitting: Pharmacist

## 2023-10-01 VITALS — BP 133/68 | HR 67 | Wt 177.4 lb

## 2023-10-01 DIAGNOSIS — E1169 Type 2 diabetes mellitus with other specified complication: Secondary | ICD-10-CM | POA: Diagnosis not present

## 2023-10-01 DIAGNOSIS — Z794 Long term (current) use of insulin: Secondary | ICD-10-CM | POA: Diagnosis not present

## 2023-10-01 DIAGNOSIS — E1165 Type 2 diabetes mellitus with hyperglycemia: Secondary | ICD-10-CM

## 2023-10-01 DIAGNOSIS — E119 Type 2 diabetes mellitus without complications: Secondary | ICD-10-CM

## 2023-10-01 DIAGNOSIS — E785 Hyperlipidemia, unspecified: Secondary | ICD-10-CM

## 2023-10-01 MED ORDER — BD PEN NEEDLE MINI ULTRAFINE 31G X 5 MM MISC: 2 refills | Status: DC

## 2023-10-01 MED ORDER — LANTUS SOLOSTAR 100 UNIT/ML ~~LOC~~ SOPN
32.0000 [IU] | PEN_INJECTOR | Freq: Every day | SUBCUTANEOUS | Status: DC
Start: 2023-10-01 — End: 2023-12-03

## 2023-10-01 MED ORDER — INSULIN LISPRO (1 UNIT DIAL) 100 UNIT/ML (KWIKPEN)
20.0000 [IU] | PEN_INJECTOR | SUBCUTANEOUS | 3 refills | Status: DC
Start: 2023-10-01 — End: 2023-12-03

## 2023-10-01 NOTE — Assessment & Plan Note (Signed)
 Diabetes longstanding currently improving. Patient is able to verbalize appropriate hypoglycemia management plan. Medication adherence appears good. Control is suboptimal due to regimen. -Continued basal insulin  Lantus  (insulin  glargine) 32 units daily in the morning.  -Increased dose of rapid insulin  Humalog  (insulin  lispro) from 16-20 units to 20-22 units twice daily with meals.  -Continued SGLT2-I Jardiance  (empagliflozin ) 10 mg. Counseled on sick day rules. -Continued metformin  XR 500 mg daily with breakfast.  -Patient educated on purpose, proper use, and potential adverse effects.  -Extensively discussed pathophysiology of diabetes, recommended lifestyle interventions, dietary effects on blood sugar control.  -Counseled on s/sx of and management of hypoglycemia.

## 2023-10-01 NOTE — Assessment & Plan Note (Signed)
 ASCVD risk - Secondary prevention in patient with diabetes. Last LDL is 104 (08/14/22) not at goal of <70 mg/dL.   -Continued Ezetimibe  10 mg -Continued Repatha  (evolocumab ) 140 mg/ml every 14 days  -Continued Omega-Fatty acid capsules 1000 mg daily -Ordered Lipid panel

## 2023-10-01 NOTE — Progress Notes (Signed)
 S:     Chief Complaint  Patient presents with   Medication Management    Dm f/u   68 y.o. female who presents for diabetes evaluation, education, and management. Patient arrives in good spirits and presents with assistance of a rolling walker  Patient was referred and last seen by Primary Care Provider, Dr. Stoney, on 09/22/23.  At last visit, A1c was found to be 11.8 but had a GMI of 9.0.   PMH is significant for T2DM, hypertension, hyperlipidemia, CKD.  Patient reports Diabetes was diagnosed 5+ years ago.    Current diabetes medications include: Jardiance  (empagliflozin ) 10 mg daily, Metformin  XR 500 mg daily, Lantus  (insulin  glargine) 32 units, Humalog  (insulin  lispro) 16-20 units twice daily with meals Current hypertension medications include: amlodipine  5 mg daily, olmesartan /hydrochlorothiazide  20-12.5 mg, Carvedilol  12.5 mg 1.5 tablets twice daily Current hyperlipidemia medications include: Ezetimibe  10 mg, Omega-3 Fatty acids 100 mg daily, Repatha  140 mg/ml every 14 days  Patient reports adherence to taking all medications as prescribed.   Do you feel that your medications are working for you? yes Have you been experiencing any side effects to the medications prescribed? no  Patient denies hypoglycemic events.  Patient denies nocturia (nighttime urination).  Patient denies neuropathy (nerve pain). Patient denies visual changes. Patient reports self foot exams.   Patient reported dietary habits: Eats 1-2 meals/day Not eating much recently Vegetable soups and crackers Breakfast: eggs, grits Snack: some sweets (cookies), popcorn, banana Drink: Lemon water, Powerade (zero sugar)   Patient-reported exercise habits: walking to the mail box   O:   Review of Systems  All other systems reviewed and are negative.   Physical Exam Vitals reviewed.  Constitutional:      Appearance: Normal appearance.  Pulmonary:     Effort: Pulmonary effort is normal.   Neurological:     Mental Status: She is alert.  Psychiatric:        Mood and Affect: Mood normal.        Behavior: Behavior normal.        Thought Content: Thought content normal.        Judgment: Judgment normal.    Libre3 CGM Download today  % Time CGM is active: 72% Average Glucose: 254 mg/dL Glucose Management Indicator: 9.4  Glucose Variability: 36.3% (goal <36%) Time in Goal:  - Time in range 70-180: 25% - Time above range: 75% (48%>250) - Time below range: 0%  Lab Results  Component Value Date   HGBA1C 11.8 (A) 09/22/2023   Vitals:   10/01/23 1521  BP: 133/68  Pulse: 67  SpO2: 94%    Lipid Panel     Component Value Date/Time   CHOL 206 (H) 08/14/2022 1141   TRIG 404 (H) 08/14/2022 1141   HDL 33 (L) 08/14/2022 1141   CHOLHDL 6.2 (H) 08/14/2022 1141   CHOLHDL 5.1 04/29/2017 0557   VLDL 32 04/29/2017 0557   LDLCALC 104 (H) 08/14/2022 1141   LDLDIRECT 135 (H) 12/31/2012 0949    Clinical Atherosclerotic Cardiovascular Disease (ASCVD): Yes   Patient is participating in a Managed Medicaid Plan:  Yes   A/P: Diabetes longstanding currently improving. Patient is able to verbalize appropriate hypoglycemia management plan. Medication adherence appears good. Control is suboptimal due to regimen. -Continued basal insulin  Lantus  (insulin  glargine) 32 units daily in the morning.  -Increased dose of rapid insulin  Humalog  (insulin  lispro) from 16-20 units to 20-22 units twice daily with meals.  -Continued SGLT2-I Jardiance  (empagliflozin ) 10 mg. Counseled  on sick day rules. -Continued metformin  XR 500 mg daily with breakfast.  -Patient educated on purpose, proper use, and potential adverse effects.  -Extensively discussed pathophysiology of diabetes, recommended lifestyle interventions, dietary effects on blood sugar control.  -Counseled on s/sx of and management of hypoglycemia.    ASCVD risk - Secondary prevention in patient with diabetes. Last LDL is 104 (08/14/22)  not at goal of <70 mg/dL.   -Continued Ezetimibe  10 mg -Continued Repatha  (evolocumab ) 140 mg/ml every 14 days  -Continued Omega-Fatty acid capsules 1000 mg daily -Ordered Lipid panel     Written patient instructions provided. Patient verbalized understanding of treatment plan.  Total time in face to face counseling 37 minutes.    Follow-up:  Pharmacist 10/20/23 PCP clinic visit in 11/17/23 Patient seen with Fonda Blase, PharmD Candidate - PY3 student and Estefana Blase, PharmD Candidate - PY4 student.

## 2023-10-01 NOTE — Patient Instructions (Addendum)
 It was nice to see you today! Look at sugar numbers before bed. If sugars are less than 150 try to have a sweet.  Your goal blood sugar is 80-130 before eating and less than 180 after eating.  Medication Changes: Increase Humalog  (insulin  lispro) to 20-22 units twice daily with meals  Continue all other medication the same.   Keep up the good work with diet and exercise. Aim for a diet full of vegetables, fruit and lean meats (chicken, [place], fish). Try to limit salt intake by eating fresh or frozen vegetables (instead of canned), rinse canned vegetables prior to cooking and do not add any additional salt to meals.

## 2023-10-02 ENCOUNTER — Telehealth: Payer: Self-pay | Admitting: Pharmacist

## 2023-10-02 LAB — LIPID PANEL
Chol/HDL Ratio: 2.5 ratio (ref 0.0–4.4)
Cholesterol, Total: 95 mg/dL — ABNORMAL LOW (ref 100–199)
HDL: 38 mg/dL — ABNORMAL LOW (ref >39)
LDL Chol Calc (NIH): 21 mg/dL (ref 0–99)
Triglycerides: 239 mg/dL — ABNORMAL HIGH (ref 0–149)
VLDL Cholesterol Cal: 36 mg/dL (ref 5–40)

## 2023-10-02 NOTE — Telephone Encounter (Signed)
 Patient contacted for follow-up of lab results.   Shared that her cholesterol showed good value for LDL (bad cholesterol) and her value of triglyceride was modestly elevated but should improve with improved glucose control we are focusing on treating.   No change in therapy suggested.   Total time with patient call and documentation of interaction: 4 minutes.

## 2023-10-02 NOTE — Progress Notes (Signed)
 Reviewed and agree with Dr Rennis plan.

## 2023-10-02 NOTE — Telephone Encounter (Signed)
 Reviewed and agree with Dr Rennis plan.

## 2023-10-20 ENCOUNTER — Ambulatory Visit: Admitting: Pharmacist

## 2023-10-28 ENCOUNTER — Telehealth: Payer: Self-pay

## 2023-10-29 ENCOUNTER — Telehealth: Payer: Self-pay

## 2023-10-29 NOTE — Patient Instructions (Signed)
 Ronal VEAR Bihari - I am sorry I was unable to reach you today for our scheduled appointment. I work with Sports coach, Kirstie, DO and am calling to support your healthcare needs. Please contact me at [phone] at your earliest convenience. I look forward to speaking with you soon.   Thank you,  Rosaline Finlay, RN MSN [place]  [facility] RN Care Manager Direct Dial : [phone]  Fax: [phone]

## 2023-10-29 NOTE — Patient Outreach (Signed)
 Care Coordination   10/29/2023 Name: Holly Acosta MRN: [id number] DOB: 07/07/1955   Care Coordination Outreach Attempts:  An unsuccessful outreach was attempted for an appointment today.  Follow Up Plan:  Additional outreach attempts will be made to complete CCM follow-up visit.   Encounter Outcome:  No Answer. HIPAA compliant voicemail left requesting return call.   Rosaline Finlay, RN MSN [place]  [facility] RN Care Manager Direct Dial : [phone]  Fax: [phone]

## 2023-10-30 NOTE — Patient Outreach (Signed)
 Care Coordination   10/30/2023 Name: Holly Acosta MRN: [id number] DOB: 19-Dec-1955   Care Coordination Outreach Attempts:  A second unsuccessful outreach was attempted today to complete CCM follow-up visit.  Follow Up Plan:  Additional outreach attempts will be made to complete follow-up visit.   Encounter Outcome:  No Answer. HIPAA compliant voicemail left requesting return call.   Rosaline Finlay, RN MSN [place]  [facility] RN Care Manager Direct Dial : [phone]  Fax: [phone]

## 2023-11-03 ENCOUNTER — Telehealth: Payer: Self-pay

## 2023-11-03 ENCOUNTER — Other Ambulatory Visit

## 2023-11-03 DIAGNOSIS — G4733 Obstructive sleep apnea (adult) (pediatric): Secondary | ICD-10-CM | POA: Diagnosis not present

## 2023-11-03 NOTE — Patient Instructions (Signed)
 Visit Information  Thank you for taking time to visit with me today. Please don't hesitate to contact me if I can be of assistance to you before our next scheduled appointment.  Your next care management appointment is no further scheduled appointments.   Closing From: Complex Care Management.  You have been connected with your Care Navigator through your [facility] plan. Your care navigator is Carly, contact number [phone] ext. [phone]  Please call the care guide team at [phone] if you need to cancel, schedule, or reschedule an appointment.   Please call the Suicide and Crisis Lifeline: 988 call 1-800-273-TALK (toll free, 24 hour hotline) if you are experiencing a Mental Health or Behavioral Health Crisis or need someone to talk to.  Rosaline Finlay, RN MSN [place]  [facility] RN Care Manager Direct Dial : [phone]  Fax: [phone]

## 2023-11-03 NOTE — Telephone Encounter (Signed)
 Patient calls nurse line reporting cold symptoms.   She reports symptoms started over the weekend. She reports red itching eyes, cough and congestion.   She denies any fevers, chills, body aches, nausea, vomiting or diarrhea. No shortness of breath. No drainage.   She reports she has been using eye drops for her eyes and reports improvement.   Conservative measures discussed with patient. OTC medications and warm fluids with honey.   Advised clinic visit if symptoms worsen or fail to improve.

## 2023-11-03 NOTE — Patient Outreach (Signed)
 Complex Care Management   Visit Note  11/03/2023  Name:  Holly Acosta MRN: [id number] DOB: 19-May-1955  Situation: Referral received for Complex Care Management related to Diabetes with Complications I obtained verbal consent from Patient.  Visit completed with Patient  on the phone  Background:   Past Medical History:  Diagnosis Date   Acute cerebrovascular accident (CVA) of cerebellum (HCC) 04/29/2017   Acute cerebrovascular accident (CVA) of cerebellum (HCC) 04/29/2017   Acute respiratory failure with hypoxia (HCC)    AKI (acute kidney injury)    Anemia    Arthritis of knee    both knees (04/24/2017)   Cataract    Cerebrovascular accident (CVA) due to occlusion of left posterior cerebral artery (HCC) 12/03/2017   Chest pain 04/24/2017   COLONIC POLYPS, HYPERPLASTIC 09/13/2009   Qualifier: Diagnosis of  By: Adella MD, Elizabeth     Constipation 06/02/2017   Coronary artery disease involving native coronary artery of native heart with unstable angina pectoris (HCC) 04/30/2017   Dehydration    Diabetes mellitus, type 2 (HCC)    Excessive daytime sleepiness 12/03/2017   History of blood transfusion    related to prolapsed uterus   HLD (hyperlipidemia)    HTN (hypertension)    Non-ST elevation (NSTEMI) myocardial infarction (HCC)    Non-ST elevation (NSTEMI) myocardial infarction (HCC)    Pneumonia due to COVID-19 virus 02/18/2019   Prolapsed uterus    Severe obstructive sleep apnea-hypopnea syndrome 01/01/2018   Sleep apnea    have mask; haven't worn it for a long time (04/24/2017)   Status post coronary artery stent placement    Umbilical hernia    Uterine prolapse 01/20/2006   Qualifier: Diagnosis of   By: Adella MD, Almarie Parker hernia 01/26/2009   Qualifier: Diagnosis of   By: Adella MD, Almarie      Replacing diagnoses that were inactivated after the 04/21/22 regulatory import      Assessment: Patient Reported Symptoms:  Cognitive Cognitive  Status: Able to follow simple commands, Alert and oriented to person, place, and time, Normal speech and language skills Cognitive/Intellectual Conditions Management [RPT]: None reported or documented in medical history or problem list      Neurological Neurological Review of Symptoms: No symptoms reported    HEENT HEENT Symptoms Reported: No symptoms reported      Cardiovascular Cardiovascular Symptoms Reported: No symptoms reported Does patient have uncontrolled Hypertension?: Yes Is patient checking Blood Pressure at home?: No Cardiovascular Management Strategies: Medication therapy  Respiratory Respiratory Symptoms Reported: Productive cough Additional Respiratory Details: Patient reports coughing up green phlegm for the past few days. She denies sick contacts, shortness of breath, or fever Respiratory Management Strategies: BiPAP  Endocrine Endocrine Symptoms Reported: No symptoms reported Is patient diabetic?: Yes Is patient checking blood sugars at home?: Yes List most recent blood sugar readings, include date and time of day: FreeStyle Libre, patient reports it is time for her to change sensor. Last blood sugar a little over 200 Endocrine Comment: Confirmed patient has increased Humalog  as instructed by Dr. Amalia  Gastrointestinal Gastrointestinal Symptoms Reported: No symptoms reported      Genitourinary Genitourinary Symptoms Reported: No symptoms reported    Integumentary Integumentary Symptoms Reported: No symptoms reported    Musculoskeletal Musculoskelatal Symptoms Reviewed: No symptoms reported Musculoskeletal Management Strategies: Medical device (Walker and cane) Musculoskeletal Comment: Patiend denies falls since previous CMRN visit. She reports she has not scheduled DEXA yet. Falls in  the past year?: Yes Number of falls in past year: 1 or less Was there an injury with Fall?: No Fall Risk Category Calculator: 1 Patient Fall Risk Level: Low Fall Risk Patient at  Risk for Falls Due to: History of fall(s), Impaired balance/gait Fall risk Follow up: Falls evaluation completed, Education provided  Psychosocial Psychosocial Symptoms Reported: No symptoms reported          11/03/2023    PHQ2-9 Depression Screening   Little interest or pleasure in doing things    Feeling down, depressed, or hopeless    PHQ-2 - Total Score    Trouble falling or staying asleep, or sleeping too much    Feeling tired or having little energy    Poor appetite or overeating     Feeling bad about yourself - or that you are a failure or have let yourself or your family down    Trouble concentrating on things, such as reading the newspaper or watching television    Moving or speaking so slowly that other people could have noticed.  Or the opposite - being so fidgety or restless that you have been moving around a lot more than usual    Thoughts that you would be better off dead, or hurting yourself in some way    PHQ2-9 Total Score    If you checked off any problems, how difficult have these problems made it for you to do your work, take care of things at home, or get along with other people    Depression Interventions/Treatment      There were no vitals filed for this visit.  Medications Reviewed Today     Reviewed by Arno Rosaline SQUIBB, RN (Registered Nurse) on 11/03/23 at [id number]  Med List Status: <None>   Medication Order Taking? Sig Documenting Provider Last Dose Status Informant  amLODipine  (NORVASC ) 5 MG tablet [id number]  Take 1 tablet (5 mg total) by mouth at bedtime. Everhart, Kirstie, DO  Active   aspirin  EC 81 MG tablet [id number]  Take 1 tablet (81 mg total) by mouth daily. Alto Lynwood SAUNDERS, MD  Active Self, Pharmacy Records  carvedilol  (COREG ) 12.5 MG tablet [id number]  Take 1.5 tablets (18.75 mg total) by mouth 2 (two) times daily with a meal. Emelia, Josefa HERO, NP  Active   Continuous Glucose Sensor (FREESTYLE LIBRE 3 PLUS SENSOR) MISC [id number]  Change sensor  every 15 days. McDiarmid, Krystal BIRCH, MD  Active   [employer] VITAMIN B12 1000 MCG tablet [id number]  TAKE 1 TABLET BY MOUTH EVERY DAY Everhart, Kirstie, DO  Active   empagliflozin  (JARDIANCE ) 10 MG TABS tablet [id number]  Take 1 tablet (10 mg total) by mouth daily. Everhart, Kirstie, DO  Active   Evolocumab  (REPATHA  SURECLICK) 140 MG/ML SOAJ [id number]  INJECT 140 MG INTO THE SKIN EVERY 14 (FOURTEEN) DAYS. [place], Peter M, MD  Active            Med Note MAUDIE, MAUDE KANDICE Schaumann Oct 01, 2023  3:24 PM) mondays  ezetimibe  (ZETIA ) 10 MG tablet [id number]  TAKE 1 TABLET BY MOUTH EVERY DAY Emelia Josefa HERO, NP  Active   ferrous sulfate  325 (65 FE) MG tablet [id number]  Take 1 tablet (325 mg total) by mouth daily with breakfast. Everhart, Kirstie, DO  Active   insulin  glargine (LANTUS  SOLOSTAR) 100 UNIT/ML Solostar Pen [id number]  Inject 32 Units into the skin daily. INJECT 34 UNITS INTO SKIN DAILY McDiarmid, Krystal BIRCH, MD  Active   insulin  lispro (  HUMALOG  KWIKPEN) 100 UNIT/ML KwikPen [id number]  Inject 20-22 Units into the skin See admin instructions. Inject 20-22 units twice daily before meals. Use 20 units if having a smaller meal with less carbs, use 22 units during most meals. McDiarmid, Krystal BIRCH, MD  Active   Insulin  Pen Needle (BD PEN NEEDLE MINI ULTRAFINE) 31G X 5 MM MISC [id number]  USE AS DIRECTED 3 TIMES A DAY McDiarmid, Krystal BIRCH, MD  Active   metFORMIN  (GLUCOPHAGE -XR) 500 MG 24 hr tablet [id number]  Take 1 tablet (500 mg total) by mouth daily with breakfast. Everhart, Kirstie, DO  Active   olmesartan -hydrochlorothiazide  (BENICAR  HCT) 20-12.5 MG tablet [id number]  TAKE 1 TABLET BY MOUTH EVERY DAY Everhart, Kirstie, DO  Active   Omega-3 Fatty Acids (FISH OIL) 1000 MG CAPS [id number]  Take 1,000 mg by mouth daily. [provider]  Active Self, Pharmacy Records            Recommendation:   PCP Follow-up Continue Current Plan of Care Patient will purchase BP monitor using OTC benefit card through  insurance  Follow Up Plan:   Closing From:  Complex Care Management Patient has been connected with care navigator through [facility]. Patient's care navigator is Carly, contact number [phone] ext. [phone]  Rosaline Finlay, RN MSN [place]  [facility] RN Care Manager Direct Dial : [phone]  Fax: [phone]

## 2023-11-04 DIAGNOSIS — G4733 Obstructive sleep apnea (adult) (pediatric): Secondary | ICD-10-CM | POA: Diagnosis not present

## 2023-11-04 NOTE — Progress Notes (Signed)
 DYAMON SOSINSKI                                          MRN: [id number]   11/04/2023   The [facility] Quality Team Specialist reviewed this patient medical record for the purposes of chart review for care gap closure. The following were reviewed: chart review for care gap closure-kidney health evaluation for diabetes:eGFR  and uACR.    [facility]BC[facility] Quality Team

## 2023-11-05 ENCOUNTER — Telehealth: Payer: Self-pay | Admitting: Pharmacist

## 2023-11-05 NOTE — Telephone Encounter (Signed)
 Patient contacted for follow-up of Diabetes management  Since last contact patient reports that she has been taking her insulin  and did not have sensor on. Expressed that she will apply new sensor today.  Current Medications include: Lantus  (insulin  glargine) 32 units daily, short-acting 20 units BID Patient denies any significant medication related side effects.  PCP visit coming up 11/17/23 and offered visit if needed.   Total time with patient call and documentation of interaction: 9 minutes.

## 2023-11-07 DIAGNOSIS — G4733 Obstructive sleep apnea (adult) (pediatric): Secondary | ICD-10-CM | POA: Diagnosis not present

## 2023-11-17 ENCOUNTER — Ambulatory Visit: Payer: Self-pay | Admitting: Family Medicine

## 2023-11-17 NOTE — Progress Notes (Deleted)
    SUBJECTIVE:   CHIEF COMPLAINT / HPI:   Presenting for diabetes follow-up Last visit: Started Jardiance  10 mg, increased Humalog  to 20 to 22 units twice daily with meals  Home medications include: Jardiance  10 mg, Lantus  34 units daily, Humalog  20 to 22 units daily, metformin  500 mg daily. {Blank single:19197::Does,Does not} endorse compliance. Home glucose monitoring {home testing:315145}. Patient is up to date on diabetic eye. Most recent A1Cs:  Lab Results  Component Value Date   HGBA1C 11.8 (A) 09/22/2023   HGBA1C >15.0 06/23/2023   Last Microalbumin, LDL, Creatinine: Lab Results  Component Value Date   MICROALBUR 3.1 (H) 03/30/2014   LDLCALC 21 10/01/2023   CREATININE 1.08 (H) 06/23/2023    PERTINENT  PMH / PSH: CAD, HTN, HLD, IDD2M, CKD3a  OBJECTIVE:   There were no vitals taken for this visit.  ***  ASSESSMENT/PLAN:   Assessment & Plan    **schedule with Koval  Mabry Tift, DO [place] [facility], [facility]

## 2023-11-22 ENCOUNTER — Other Ambulatory Visit: Payer: Self-pay | Admitting: Family Medicine

## 2023-11-22 DIAGNOSIS — D508 Other iron deficiency anemias: Secondary | ICD-10-CM

## 2023-11-27 ENCOUNTER — Ambulatory Visit: Admitting: Pharmacist

## 2023-11-27 ENCOUNTER — Ambulatory Visit: Payer: Self-pay | Admitting: Family Medicine

## 2023-11-27 ENCOUNTER — Other Ambulatory Visit: Payer: Self-pay | Admitting: Family Medicine

## 2023-11-27 DIAGNOSIS — E119 Type 2 diabetes mellitus without complications: Secondary | ICD-10-CM

## 2023-11-27 NOTE — Progress Notes (Deleted)
    SUBJECTIVE:   CHIEF COMPLAINT / HPI:   Type {Blank single:19197::1,2} Diabetes: Home medications include: ***. {Blank single:19197::Does,Does not} endorse compliance. Home glucose monitoring {home testing:315145}. Patient {rwisisnot:24883} up to date on diabetic eye. Most recent A1Cs:  Lab Results  Component Value Date   HGBA1C 11.8 (A) 09/22/2023   HGBA1C >15.0 06/23/2023   Last Microalbumin, LDL, Creatinine: Lab Results  Component Value Date   MICROALBUR 3.1 (H) 03/30/2014   LDLCALC 21 10/01/2023   CREATININE 1.08 (H) 06/23/2023    Increased humalog  to 20-22 units twice daily and started jardiance  10mg  09/2023  PERTINENT  PMH / PSH: ***  OBJECTIVE:   There were no vitals taken for this visit.  ***  ASSESSMENT/PLAN:   Assessment & Plan      Elyce Prescott, DO [place] [facility]

## 2023-12-01 ENCOUNTER — Ambulatory Visit: Admitting: Pharmacist

## 2023-12-02 NOTE — Patient Instructions (Addendum)
 Please continue using your BiPAP regularly. While your insurance requires that you use BiPAP at least 4 hours each night on 70% of the nights, I recommend, that you not skip any nights and use it throughout the night if you can. Getting used to BiPAP and staying with the treatment long term does take time and patience and discipline. Untreated obstructive sleep apnea when it is moderate to severe can have an adverse impact on cardiovascular health and raise her risk for heart disease, arrhythmias, hypertension, congestive heart failure, stroke and diabetes. Untreated obstructive sleep apnea causes sleep disruption, nonrestorative sleep, and sleep deprivation. This can have an impact on your day to day functioning and cause daytime sleepiness and impairment of cognitive function, memory loss, mood disturbance, and problems focussing. Using BiPAP regularly can improve these symptoms.  You still have a really large air leak. I will submit orders for a mask refitting.   Follow up in 6 months

## 2023-12-02 NOTE — Progress Notes (Signed)
 PATIENT: Holly Acosta DOB: [birth year]  REASON FOR VISIT: follow up HISTORY FROM: patient  Chief Complaint  Patient presents with   Follow-up    Pt in room 1. Alone,Here for cpap follow up.     HISTORY OF PRESENT ILLNESS:  12/07/23 ALL: Holly Acosta returns for follow up for OSA on BiPAP. She had BiPAP titration study 05/2023. Best AHI 8/hr on 18/13cmH20. She reports doing well on new machine. She is sleeping better. She denies concerns with machine or supplies. She has not been able to use her machine over the past three weeks due to a virus. She resumed use last night and slept well.     12/03/2022 ALL:  Holly Acosta returns for follow up for OSA on BiPAP. She was last seen 11/2021. Compliance had improved but she was having trouble with increased air leak. Since, she reports doing well. She has an older machine and is receiving an error message on her machine. She has not used her machine for the past week due to this message. She reports that her machine will turn off spontaneously. She is eligible for a new machine in 2020.     12/04/2021 ALL: Holly Acosta returns for follow up for OSA on BiPAP. She was last seen 05/2021. Compliance was suboptimal and she was having difficulty with an air leak with FFM. Since, she reports doing much better with compliance. She is using BiPAP most every night for about 5 hours. She continues to struggle with an air leak. She is using FFM and feels it is the best fit for her. She feels that she can tighten her mask up at home.      05/23/2021 ALL:  Holly Acosta returns for follow up for OSA on BiPAP. We last saw her 01/2020 and she had resumed therapy. Four hour compliance was sub optimal. Repeat download in 04/2020 showed improvement and she was encouraged to continue working toward compliance goals. She has been lost to follow up. She reports that she has continued to use CPAP regularly. She did have a break in usage for a couple of weeks in March following cataract surgery but  has resumed therapy and doing well. She has used every night for the past 3.5 weeks. She does note a leak in her FFM. She feels if she tightens mask, it hurts bridge of nose.     02/01/2020 ALL:  She is doing well. She has resumed BiPAP therapy and is doing well. She denies difficulty with her machine or supplies. She has not heard anything regarding recall. She does note some air leaking from her mask but may need a replacement.   Compliance report dated 01/01/2020 through 01/30/2020 reveals that she used CPAP 29 of the past 30 days for compliance of 96.7%.  She used CPAP greater than 4 hours 66.7% of the time.  Average usage on days used was 4 hours and 21 minutes.  Residual AHI was 5.7 with a EPAP of 18 and IPAP of 21.  Average time in large leak per day was 1 hour and 39 minutes.  History (copied from previous notes)  10/26/2019 Holly Acosta is a 68 y.o. female here today for follow up for OSA on BiPAP. She was admitted to the hospital 01/2019 for PNA in setting of Covid.  She reports that she is recovered nicely.  She continues to follow-up very closely with primary care.  Diabetes has not been well controlled recently but she is working closely with her providers.  She has not used BiPAP consistently.  She has been concerned about the recall.  She thinks that someone told her her machine was not effective.  She does not use an external cleanser.  She does not expose BiPAP machine to excessive heat.  She is motivated to continue using BiPAP as she feels much less tired when she is using it consistently.  Compliance report dated 09/12/2019 through 10/11/2019 reveals that she used BiPAP therapy six of the past 30 days for compliance of 20%.  She used CPAP greater than 4 hours three of the past 30 days for compliance of 10%.  Residual AHI was 2.9 with a EPAP of 18 cm of water and IPAP of 21 cm of water.  There was no leak noted.  HISTORY: (copied from my note on 02/10/2019)  Holly negativeMary H Acosta is  a 68 y.o. female here today for follow up for OSA on BiPAP.  Four hour usage remains sub optimal. She continues to work on meeting 4 hour goal. She admits that sometimes she falls asleep without placing BiPAP and sometimes she pulls her mask off during the night. Otherwise, she is doing very well. She notes benefit of BiPAP therapy.    Compliance report dated 01/10/2019 through 02/08/2019 reveals that she used BiPAP 28 of the last 30 days compliance of 93%.  She used BiPAP greater than 4 hours 57% of the time.  Average usage was 3 hours and 59 minutes.  Residual AHI was 5.4 with IPAP of 21 cm of water and EPAP of 18 cm of water. No significant leak noted.    HISTORY: (copied from my note on 08/09/2018)   Holly Acosta is a 68 y.o. female here today for follow up of OSA on BiPAP.  She is doing well with therapy.  She is using her machine every night.  She reports that she continues to struggle with using it for more than 4 hours each night.  She does feel that this is improving with time.  She denies any concerns with mask.  Compliance download dated 07/06/2018 through 08/04/2018 reveals that she is using her machine 29 of the last 30 days.  On average she used her machine 3 hours and 34 minutes.  AHI was 6.1 with EPAP of 18 cm of water and IPAP of 21 cm of water.  There was no significant leak.  She does note significant improvement in her fatigue.   History (copied from my note on 04/29/2018)   Holly Acosta is a 68 y.o. female for follow up of OSA on BiPAP. She she reports that she is doing well with her BiPAP machine.  She definitely feels that it helps with sleep and her energy the next day.  Download report reveals that she is used her machine 27 out of the last 30 days for compliance of 90%.  63% compliance with use greater than 4 hours.  She averages about 4 hours and 53 minutes.  AHI is 8.1 with EPAP at 18 cm of water and IPAP at 21 cm of water.   History (copied from Dr Dohmeier's note on  12/03/2017)   HPI:  Holly Acosta is a 68 y.o. female patient and seen  on 12-03-2017 upon referral for a sleep evaluation.  Holly Acosta is a [facility] patient with the resident clinic, she was admitted to [facility] on 27 April 2017 for a cardiac infarct status post stent placement and she has been using  aspirin  and Brilinta .  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  Half function at the time was an ejection fraction of 55 to 60% with multi regional hypokinesis.  The patient has known diabetes which has been poorly controlled at the time her HbA1c was 13.3.  Additional risk factors for stroke and heart disease are hyperlipidemia, obesity and untreated obstructive sleep apnea.  The patient was seen by Dr. Cheyenne and then by Dr. Rosemarie on 06 October 2017 HbA1c was now 9.3, still very elevated but improved.  She has a little bit of bruising and bleeding from Brilinta  in combination with aspirin .  She is using a walker rehab had been delayed due to transportation problems.  She takes Crestor  and her blood pressure has been controlled.    Her previous sleep study 9/20/ 2011 was ordered through Drs. Mulberry and MacPherson, it revealed severe OSA at an AHI of 48/h. CPAP therapy was recommnended. and sleep evaluations were obtained through the [facility] lab. The machine is 67 years old and now  broken. She can't remember who followed her in the [facility] sleep medicine clinic. The study was read by Dr. Reggy Salt.      Chief complaint according to patient : I may have to go back on CPAP - but I sleep fairly good and  I easily fall asleep when I am inactive and not stimulated.   Sleep habits are as follows:Donner time is  around 7 Pm, and she goes to bed most nights between 10 and 11 PM. Has a seperate bedroom from her living room, and retreats at that time. The bedroom is kept cool, quiet and dark. She sleeps on her back , some times on her left side. She needs 2 pillows and likes the head of bed elevated, sleeps with a fan in the background. She is not dreaming much, she stated.  Reports only one bathroom break around 4-5 AM. Wakes sponatanously between 6 and 7 AM , and she estimates 7-8 hours of sleep each night.  She wakes up refreshed. No headaches in AM- but sometimes with a dry mouth.    She naps in daytime, unscheduled. She lives with 2 granddaughters, who reported her snoring loudly and witnessed her having sleep apnea.      Medical history : already in record, reviewed.    Family history : Sister is only family member with sleep apnea.     Social history: her 2 granddaughters live at her house , she is divorced. Disability due to knee osteoarthritis, she became disabled 2015 and has yet not received hip or knee surgery . She quit smoking over 20 years ago, but one granddaughter smokes in the house. ETOH- none, caffeine - she drinks coffee in AM , iced tea for lunch and dinner    REVIEW OF SYSTEMS: Out of a complete 14 system review of symptoms, the patient complains only of the following symptoms, fatigue, imbalance, and all other reviewed systems are Acosta.  ESS: 13/24, previously 12 FSS: 23/63   ALLERGIES: Allergies  Allergen Reactions   Lipitor [Atorvastatin ] Other (See Comments)    Cramping in feet that resolved after stopping med   Diclofenac  Nausea Only and Other (See Comments)    Caused  a lot of stomach pain     HOME MEDICATIONS: Outpatient Medications Prior to Visit  Medication Sig Dispense Refill   ACCU-CHEK GUIDE TEST test strip PLEASE USE TO CHECK BLOOD SUGAR UP TO THREE TIMES DAILY. 300 strip 11   amLODipine  (NORVASC ) 5 MG tablet Take 1 tablet (5 mg total) by mouth at bedtime.  30 tablet 12   aspirin  EC 81 MG tablet Take 1 tablet (81 mg total) by mouth daily. 90 tablet 3   carvedilol  (COREG ) 12.5 MG tablet Take 1.5 tablets (18.75 mg total) by mouth 2 (two) times daily with a meal. 45 tablet 12   Continuous Glucose Sensor (FREESTYLE LIBRE 3 PLUS SENSOR) MISC Change sensor every 15 days. 2 each 11   [employer] VITAMIN B12 1000 MCG tablet TAKE 1 TABLET BY MOUTH EVERY DAY 90 tablet 1   Evolocumab  (REPATHA  SURECLICK) 140 MG/ML SOAJ INJECT 140 MG INTO THE SKIN EVERY 14 (FOURTEEN) DAYS. 6 mL 1   ezetimibe  (ZETIA ) 10 MG tablet TAKE 1 TABLET BY MOUTH EVERY DAY 90 tablet 3   ferrous sulfate  325 (65 FE) MG tablet TAKE 1 TABLET BY MOUTH EVERY DAY WITH BREAKFAST 90 tablet 1   insulin  glargine (LANTUS  SOLOSTAR) 100 UNIT/ML Solostar Pen Inject 40 Units into the skin daily. INJECT 40 UNITS INTO SKIN DAILY 15 mL 3   insulin  lispro (HUMALOG  KWIKPEN) 100 UNIT/ML KwikPen Inject 25 Units into the skin See admin instructions. Inject 25 units twice daily before meals. 15 mL 3   Insulin  Pen Needle (BD PEN NEEDLE MINI ULTRAFINE) 31G X 5 MM MISC USE AS DIRECTED 3 TIMES A DAY 100 each 2   metFORMIN  (GLUCOPHAGE -XR) 500 MG 24 hr tablet TAKE 1 TABLET BY MOUTH EVERY DAY WITH BREAKFAST 90 tablet 1   olmesartan -hydrochlorothiazide  (BENICAR  HCT) 20-12.5 MG tablet TAKE 1 TABLET BY MOUTH EVERY DAY 90 tablet 3   Omega-3 Fatty Acids (FISH OIL) 1000 MG CAPS Take 1,000 mg by mouth daily.     No facility-administered medications prior to visit.    PAST MEDICAL HISTORY: Past Medical History:  Diagnosis Date   Acute cerebrovascular accident (CVA) of cerebellum (HCC) 04/29/2017   Acute cerebrovascular accident (CVA) of cerebellum (HCC) 04/29/2017   Acute respiratory failure with hypoxia (HCC)    AKI (acute kidney injury)    Anemia    Arthritis of knee    both knees (04/24/2017)   Cataract    Cerebrovascular accident (CVA) due to occlusion of left posterior cerebral artery (HCC) 12/03/2017   Chest pain  04/24/2017   COLONIC POLYPS, HYPERPLASTIC 09/13/2009   Qualifier: Diagnosis of  By: Adella MD, Elizabeth     Constipation 06/02/2017   Coronary artery disease involving native coronary artery of native heart with unstable angina pectoris (HCC) 04/30/2017   Dehydration    Diabetes mellitus, type 2 (HCC)    Excessive daytime sleepiness 12/03/2017   History of blood transfusion    related to prolapsed uterus   HLD (hyperlipidemia)    HTN (hypertension)    Non-ST elevation (NSTEMI) myocardial infarction (HCC)    Non-ST elevation (NSTEMI) myocardial infarction (HCC)    Pneumonia due to COVID-19 virus 02/18/2019   Prolapsed uterus    Severe obstructive sleep apnea-hypopnea syndrome 01/01/2018   Sleep apnea    have mask; haven't worn it for a long time (04/24/2017)   Status post coronary artery stent placement    Umbilical hernia    Uterine prolapse 01/20/2006   Qualifier: Diagnosis of   By: Adella  MD, Almarie Parker hernia 01/26/2009   Qualifier: Diagnosis of   By: Adella MD, Almarie      Replacing diagnoses that were inactivated after the 04/21/22 regulatory import      PAST SURGICAL HISTORY: Past Surgical History:  Procedure Laterality Date   CARDIAC CATHETERIZATION  2011   COLONOSCOPY     CORONARY STENT INTERVENTION N/A 04/27/2017   Procedure: CORONARY STENT INTERVENTION;  Surgeon: Dann Candyce RAMAN, MD;  Location: [facility] INVASIVE CV LAB;  Service: Cardiovascular;  Laterality: N/A;   LEFT HEART CATH AND CORONARY ANGIOGRAPHY N/A 04/27/2017   Procedure: LEFT HEART CATH AND CORONARY ANGIOGRAPHY;  Surgeon: Dann Candyce RAMAN, MD;  Location: [facility] INVASIVE CV LAB;  Service: Cardiovascular;  Laterality: N/A;   TUBAL LIGATION      FAMILY HISTORY: Family History  Problem Relation Age of Onset   Diabetes Mother    Hypertension Mother    Hypertension Father    Colon cancer Neg Hx    Colon polyps Neg Hx    Esophageal cancer Neg Hx    Stomach cancer Neg Hx    Rectal  cancer Neg Hx     SOCIAL HISTORY: Social History   Socioeconomic History   Marital status: Divorced    Spouse name: Not on file   Number of children: Not on file   Years of education: Not on file   Highest education level: Not on file  Occupational History   Not on file  Tobacco Use   Smoking status: Former    Current packs/day: 0.00    Average packs/day: 1 pack/day for 33.8 years (33.8 ttl pk-yrs)    Types: Cigarettes    Start date: 01/20/1973    Quit date: 11/04/2006    Years since quitting: 17.1   Smokeless tobacco: Never  Vaping Use   Vaping status: Never Used  Substance and Sexual Activity   Alcohol use: Never    Alcohol/week: 0.0 standard drinks of alcohol   Drug use: Never   Sexual activity: Not Currently  Other Topics Concern   Not on file  Social History Narrative   Lives with daughter Marcelle age 75) and 4 grandchildren.   Disability - Arthritis. Does get disability check.   Does not have a living will yet   Social Drivers of Corporate Investment Banker Strain: Low Risk  (07/25/2022)   Overall Financial Resource Strain (CARDIA)    Difficulty of Paying Living Expenses: Not hard at all  Food Insecurity: No Food Insecurity (07/30/2023)   Hunger Vital Sign    Worried About Running Out of Food in the Last Year: Never true    Ran Out of Food in the Last Year: Never true  Transportation Needs: No Transportation Needs (07/30/2023)   PRAPARE - Administrator, Civil Service (Medical): No    Lack of Transportation (Non-Medical): No  Physical Activity: Inactive (07/25/2022)   Exercise Vital Sign    Days of Exercise per Week: 0 days    Minutes of Exercise per Session: 0 min  Stress: No Stress Concern Present (07/25/2022)   [employer] of Occupational Health - Occupational Stress Questionnaire    Feeling of Stress : Not at all  Social Connections: Moderately Isolated (07/25/2022)   Social Connection and Isolation Panel    Frequency of Communication with  Friends and Family: More than three times a week    Frequency of Social Gatherings with Friends and Family: Three times a  week    Attends Religious Services: More than 4 times per year    Active Member of Clubs or Organizations: No    Attends Banker Meetings: Never    Marital Status: Divorced  Catering Manager Violence: Not At Risk (07/30/2023)   Humiliation, Afraid, Rape, and Kick questionnaire    Fear of Current or Ex-Partner: No    Emotionally Abused: No    Physically Abused: No    Sexually Abused: No      PHYSICAL EXAM  Vitals:   12/07/23 1314  BP: 118/82  Pulse: 80  SpO2: 94%  Weight: 177 lb (80.3 kg)  Height: 5' 4 (1.626 m)    Neck circ: 14 Mallampati 3, dentures on top   Body mass index is 30.38 kg/m.  Generalized: Well developed, in no acute distress  Cardiology: normal rate and rhythm, no murmur noted Respiratory: clear to auscultation bilaterally  Neurological examination  Mentation: Alert oriented to time, place, history taking. Follows all commands speech and language fluent Cranial nerve II-XII: Pupils were equal round reactive to light. Extraocular movements were full, visual field were full  Motor: The motor testing reveals 5 over 5 strength of all 4 extremities. Good symmetric motor tone is noted throughout.  Gait and station: Gait is stable with Rolator   DIAGNOSTIC DATA (LABS, IMAGING, TESTING) - I reviewed patient records, labs, notes, testing and imaging myself where available.     07/23/2023   10:37 AM  MMSE - Mini Mental State Exam  Not completed: Unable to complete     Lab Results  Component Value Date   WBC 6.9 06/23/2023   HGB 13.6 06/23/2023   HCT 43.5 06/23/2023   MCV 90.2 06/23/2023   PLT 234 06/23/2023      Component Value Date/Time   NA 132 (L) 06/23/2023 1617   NA 137 04/03/2023 1656   K 4.0 06/23/2023 1617   CL 98 06/23/2023 1617   CO2 27 06/23/2023 1617   GLUCOSE 421 (H) 06/23/2023 1617   BUN 18  06/23/2023 1617   BUN 20 04/03/2023 1656   CREATININE 1.08 (H) 06/23/2023 1617   CREATININE 0.93 02/01/2015 1640   CALCIUM  9.1 06/23/2023 1617   PROT 7.4 06/23/2023 1617   PROT 6.9 08/14/2022 1141   ALBUMIN 3.6 06/23/2023 1617   ALBUMIN 4.1 08/14/2022 1141   AST 27 06/23/2023 1617   ALT 28 06/23/2023 1617   ALKPHOS 130 (H) 06/23/2023 1617   BILITOT 0.6 06/23/2023 1617   BILITOT <0.2 08/14/2022 1141   GFRNONAA 56 (L) 06/23/2023 1617   GFRNONAA 67 02/01/2015 1640   GFRAA 61 03/06/2020 0915   GFRAA 78 02/01/2015 1640   Lab Results  Component Value Date   CHOL 95 (L) 10/01/2023   HDL 38 (L) 10/01/2023   LDLCALC 21 10/01/2023   LDLDIRECT 135 (H) 12/31/2012   TRIG 239 (H) 10/01/2023   CHOLHDL 2.5 10/01/2023   Lab Results  Component Value Date   HGBA1C 11.8 (A) 09/22/2023   Lab Results  Component Value Date   VITAMINB12 131 (L) 01/19/2022   Lab Results  Component Value Date   TSH 0.422 04/28/2017     ASSESSMENT AND PLAN 68 y.o. year old female  has a past medical history of Acute cerebrovascular accident (CVA) of cerebellum (HCC) (04/29/2017), Acute cerebrovascular accident (CVA) of cerebellum (HCC) (04/29/2017), Acute respiratory failure with hypoxia (HCC), AKI (acute kidney injury), Anemia, Arthritis of knee, Cataract, Cerebrovascular accident (CVA) due to occlusion of left posterior  cerebral artery (HCC) (12/03/2017), Chest pain (04/24/2017), COLONIC POLYPS, HYPERPLASTIC (09/13/2009), Constipation (06/02/2017), Coronary artery disease involving native coronary artery of native heart with unstable angina pectoris (HCC) (04/30/2017), Dehydration, Diabetes mellitus, type 2 (HCC), Excessive daytime sleepiness (12/03/2017), History of blood transfusion, HLD (hyperlipidemia), HTN (hypertension), Non-ST elevation (NSTEMI) myocardial infarction [facility]), Non-ST elevation (NSTEMI) myocardial infarction [facility]), Pneumonia due to COVID-19 virus (02/18/2019), Prolapsed uterus, Severe  obstructive sleep apnea-hypopnea syndrome (01/01/2018), Sleep apnea, Status post coronary artery stent placement, Umbilical hernia, Uterine prolapse (01/20/2006), and Ventral hernia (01/26/2009). here with     ICD-10-CM   1. OSA treated with BiPAP  G47.33 For home use only DME Bipap      Zavannah reports that she is doing well today.  She continues BiPAP therapy and reports that she is feeling better. Compliance remains sub optimal. There is a large air leak. I have encouraged her to focus on correct mask seal at home. She may reach out to DME for mask refit if needed. She was encouraged to use BiPAP nightly and for greater than 4 hours each night. She will continue follow-up with primary care as directed.  Healthy lifestyle habits encouraged.  I will have her follow-up in 6 months.  She verbalizes understanding and agreement with this plan.   Orders Placed This Encounter  Procedures   For home use only DME Bipap    Large air leak, Mask refitting    Length of Need:   Lifetime    Inspiratory pressure:   OTHER SEE COMMENTS    Expiratory pressure:   OTHER SEE COMMENTS     No orders of the defined types were placed in this encounter.    I personally spent a total of 30 minutes in the care of the patient today including preparing to see the patient, getting/reviewing separately obtained history, performing a medically appropriate exam/evaluation, counseling and educating, placing orders, documenting clinical information in the EHR, independently interpreting results, and communicating results.    Greig Forbes, FNP-C 12/07/2023, 3:21 PM [facility] [address] [place], [place] [address] [phone]

## 2023-12-03 ENCOUNTER — Encounter: Payer: Self-pay | Admitting: Pharmacist

## 2023-12-03 ENCOUNTER — Ambulatory Visit (INDEPENDENT_AMBULATORY_CARE_PROVIDER_SITE_OTHER): Admitting: Family Medicine

## 2023-12-03 ENCOUNTER — Encounter: Payer: Self-pay | Admitting: Family Medicine

## 2023-12-03 ENCOUNTER — Ambulatory Visit (INDEPENDENT_AMBULATORY_CARE_PROVIDER_SITE_OTHER): Admitting: Pharmacist

## 2023-12-03 VITALS — BP 139/72 | HR 81 | Ht 65.5 in | Wt 175.0 lb

## 2023-12-03 VITALS — BP 139/72 | HR 81 | Wt 175.4 lb

## 2023-12-03 DIAGNOSIS — Z13820 Encounter for screening for osteoporosis: Secondary | ICD-10-CM

## 2023-12-03 DIAGNOSIS — E1169 Type 2 diabetes mellitus with other specified complication: Secondary | ICD-10-CM

## 2023-12-03 DIAGNOSIS — I152 Hypertension secondary to endocrine disorders: Secondary | ICD-10-CM

## 2023-12-03 DIAGNOSIS — E785 Hyperlipidemia, unspecified: Secondary | ICD-10-CM

## 2023-12-03 DIAGNOSIS — Z23 Encounter for immunization: Secondary | ICD-10-CM | POA: Diagnosis not present

## 2023-12-03 DIAGNOSIS — E1165 Type 2 diabetes mellitus with hyperglycemia: Secondary | ICD-10-CM

## 2023-12-03 DIAGNOSIS — E1159 Type 2 diabetes mellitus with other circulatory complications: Secondary | ICD-10-CM

## 2023-12-03 DIAGNOSIS — E119 Type 2 diabetes mellitus without complications: Secondary | ICD-10-CM | POA: Diagnosis not present

## 2023-12-03 DIAGNOSIS — Z794 Long term (current) use of insulin: Secondary | ICD-10-CM

## 2023-12-03 DIAGNOSIS — Z1231 Encounter for screening mammogram for malignant neoplasm of breast: Secondary | ICD-10-CM

## 2023-12-03 MED ORDER — BD PEN NEEDLE MINI ULTRAFINE 31G X 5 MM MISC: 2 refills | Status: AC

## 2023-12-03 MED ORDER — LANTUS SOLOSTAR 100 UNIT/ML ~~LOC~~ SOPN: 40.0000 [IU] | PEN_INJECTOR | Freq: Every day | SUBCUTANEOUS | 3 refills | Status: DC

## 2023-12-03 MED ORDER — INSULIN LISPRO (1 UNIT DIAL) 100 UNIT/ML (KWIKPEN): 25.0000 [IU] | PEN_INJECTOR | SUBCUTANEOUS | 3 refills | Status: DC

## 2023-12-03 MED ORDER — FREESTYLE LIBRE 3 PLUS SENSOR MISC: 11 refills | Status: AC

## 2023-12-03 NOTE — Assessment & Plan Note (Addendum)
 Hypertension longstanding currently controlled. Blood pressure goal of <130/80  mmHg, controlled today in office reading 139/72. Medication adherence fair. Reported Benicar  made her wake up to pee a night.  -Continued amlodipine  5 mg daily.  -Continued olmesartan -hydrochlorothiazide  20-12.5 mg daily, advised to change dosing to the morning to minimize nocturia due to hydrochlorothiazide .

## 2023-12-03 NOTE — Progress Notes (Signed)
    SUBJECTIVE:   CHIEF COMPLAINT / HPI:   Type 2 Diabetes: Does endorse compliance to medications most of the time. Home glucose monitoring is performed regularly. Patient is up to date on diabetic eye. Most recent A1Cs:  Lab Results  Component Value Date   HGBA1C 11.8 (A) 09/22/2023   HGBA1C >15.0 06/23/2023   Last Microalbumin, LDL, Creatinine: Lab Results  Component Value Date   MICROALBUR 3.1 (H) 03/30/2014   LDLCALC 21 10/01/2023   CREATININE 1.08 (H) 06/23/2023    Increased humalog  to 20-22 units twice daily and started jardiance  10mg  09/2023. Stopped jardiance  due to upset stomach Today, increased basal Lantus  32U to 40U daily in the morning,, increased short acting lispro to 25 units twice daily before meals due to GMI of 11% on libre during visit with pharmacist  Patient denies any neuropathy symptoms in her feet.  She is eating a high carb diet.  She has not had a DEXA or mammogram done.  PERTINENT  PMH / PSH: CAD, HTN, OSA, HLD, T2DM, CKD3a  OBJECTIVE:   BP 139/72   Pulse 81   Ht 5' 5.5 (1.664 m)   Wt 175 lb (79.4 kg)   SpO2 96%   BMI 28.68 kg/m   General: well appearing, NAD Cardiovascular: RRR, no m/r/g Respiratory: normal work of breathing on RA, CTAB Abdomen: Normal bowel sounds, soft, non-tender Extremities: No swelling BLE Diabetic Foot Exam - Simple   Simple Foot Form Visual Inspection No deformities, no ulcerations, no other skin breakdown bilaterally: Yes Sensation Testing Intact to touch and monofilament testing bilaterally: Yes Pulse Check Posterior Tibialis and Dorsalis pulse intact bilaterally: Yes Comments Slightly decreased sensation to all toes bilaterally     ASSESSMENT/PLAN:   Assessment & Plan Uncontrolled type 2 diabetes mellitus with hyperglycemia (HCC) Uncontrolled, GMI 11% today.  Due for A1c next month. Stopped SGLT2 due to GI upset. Now on Lantus  40U daily in morning, Humalog  25U BID before meals Will follow up 12/29/23  with myself and pharmacy Screening for osteoporosis Pt is due for DEXA. Has never been done. Order placed. Encounter for screening mammogram for malignant neoplasm of breast Patient given number to schedule mammogram, it was ordered a few months ago and has not been completed. Encounter for immunization Received pneumococcal shot today     Anshul Meddings, DO [facility]

## 2023-12-03 NOTE — Progress Notes (Signed)
 S:    Chief Complaint  Patient presents with   Medication Management    DM Follow-up   68 y.o. female who presents for diabetes evaluation, education, and management. Patient arrives in good spirits and presents with a rolling walker.  Patient was referred and last seen by Primary Care Provider, Dr. Stoney, on 09/22/2023.  She also has appointment with Dr. Stoney later this AM.   PMH is significant for T2DM, Hypertension, obesity, Ostearthritis, hyperlipidemia, depression, CAD.  Family/Social History: living with granddaughter 2 weeks per month. Granddaughter works in home health in a 7 on 7 off schedule.    Current diabetes medications include: Farxiga  (dapagliflozin ) - not taking due to reported upset stomach, Lantus  (insulin  glargine) 32units in the morning, Humalog  (insulin  lispro) 20-22 units before meal time Current hypertension medications include: amlodipine  5 mg, olmesartan -hydrochlorothiazide  (Benicar ) 20-12.5 mg Current hyperlipidemia medications include: Repatha  10 mg every 14d, ezetimibe  10 mg daily  Patient reports adherence to taking all medications as prescribed. Patient denies hypoglycemic events.  Patient reports nocturia (nighttime urination). 2-4 times per night. Reported it got worse when she took Benicar  at night.  Patient denies neuropathy (nerve pain). Patient denies visual changes. Patient reports self foot exams.   Patient reported dietary habits: Eats 2 meals/day. Reported she has been eating the wrong stuff with a lot of carb (bread, rice and potatoes). She likes drinking sweet tea (a couple of drink per day).   Patient-reported exercise habits: not much walking when it is cold. Reported ~15 minutes walking per day with the walker.  O:   Review of Systems  Genitourinary:  Positive for frequency (nocturia - multiple times per night.).  All other systems reviewed and are negative.  Physical Exam Pulmonary:     Effort: Pulmonary effort is  normal.  Neurological:     Mental Status: She is alert.  Psychiatric:        Mood and Affect: Mood normal.        Behavior: Behavior normal.   Libre3 CGM Download today 11/20/23-12/03/23 % Time CGM is active: 74% Average Glucose: 320 mg/dL Glucose Management Indicator: 11%  Glucose Variability: 22.6% (goal <36%) Time in Goal:  - Time in range 70-180: 2% - Time above range: 98% - Time below range: 0%  Lab Results  Component Value Date   HGBA1C 11.8 (A) 09/22/2023   Vitals:   12/03/23 1022  BP: 139/72  Pulse: 81  SpO2: 96%    Lipid Panel     Component Value Date/Time   CHOL 95 (L) 10/01/2023 1556   TRIG 239 (H) 10/01/2023 1556   HDL 38 (L) 10/01/2023 1556   CHOLHDL 2.5 10/01/2023 1556   CHOLHDL 5.1 04/29/2017 0557   VLDL 32 04/29/2017 0557   LDLCALC 21 10/01/2023 1556   LDLDIRECT 135 (H) 12/31/2012 0949    Clinical Atherosclerotic Cardiovascular Disease (ASCVD): Yes  The ASCVD Risk score (Arnett DK, et al., 2019) failed to calculate for the following reasons:   Risk score cannot be calculated because patient has a medical history suggesting prior/existing ASCVD   A/P: Diabetes longstanding since 2009 currently uncontrolled. GMI today 11%.  This is worsened since last appointment.  Medication adherence appears poor-fair. Control is suboptimal due to concern of non-adherence, suboptimal regimen.  -Increased dose of basal insulin  Lantus  (insulin  glargine) from 32 units to 40 units daily in the morning.   -Increased dose of rapid insulin  Humalog  (insulin  lispro) from 20-22 units with meals to 25 units BID before  meals.  -Discontinued SGLT2-I Farxiga  (dapagliflozin ) due to patient-reported not taking because of stomach upset and current level of uncontrolled glucose.  -Counseled patient to bring all of her medications to the next follow-up visit with pharmacist on 12/29/23. -Extensively discussed pathophysiology of diabetes, recommended lifestyle interventions, dietary  effects on blood sugar control.   ASCVD risk - secondary prevention in patient with diabetes. Last LDL is at goal of <70 mg/dL. - -Continued Repatha  140 mg q14d and ezetimibe  10 mg daily   Hypertension longstanding currently controlled. Blood pressure goal of <130/80  mmHg, controlled today in office reading 139/72. Medication adherence fair. Reported Benicar  made her wake up to pee a night.  -Continued amlodipine  5 mg daily.  -Continued olmesartan -hydrochlorothiazide  20-12.5 mg daily, advised to change dosing to the morning to minimize nocturia due to hydrochlorothiazide .   Written patient instructions provided. Patient verbalized understanding of treatment plan.  Total time in face to face counseling 30 minutes.    Follow-up:  Pharmacist 12/29/2023 Patient seen with Lawson Mao, PharmD Candidate - PY3 student and Recardo [employer] PharmD - PY4 Candidate.

## 2023-12-03 NOTE — Patient Instructions (Addendum)
 Good to see you today - Thank you for coming in  Things we discussed today:  Please call [phone] to schedule your mammogram  And Call the following number about your hearing aids: [facility] [address] [place], [place] [address] [phone]   We will see you in 1 month!

## 2023-12-03 NOTE — Assessment & Plan Note (Addendum)
 Diabetes longstanding since 2009 currently uncontrolled. GMI today 11%.  This is worsened since last appointment.  Medication adherence appears poor-fair. Control is suboptimal due to concern of non-adherence, suboptimal regimen.  -Increased dose of basal insulin  Lantus  (insulin  glargine) from 32 units to 40 units daily in the morning.   -Increased dose of rapid insulin  Humalog  (insulin  lispro) from 20-22 units with meals to 25 units BID before meals.  -Discontinued SGLT2-I Farxiga  (dapagliflozin ) due to patient-reported not taking because of stomach upset and current level of uncontrolled glucose.  -Counseled patient to bring all of her medications to the next follow-up visit with pharmacist on 12/29/23. -Extensively discussed pathophysiology of diabetes, recommended lifestyle interventions, dietary effects on blood sugar control.

## 2023-12-03 NOTE — Assessment & Plan Note (Signed)
 Patient given number to schedule mammogram, it was ordered a few months ago and has not been completed.

## 2023-12-03 NOTE — Patient Instructions (Signed)
 It was nice to see you today!  Your goal blood sugar is 80-130 before eating and less than 180 after eating.  Medication Changes: Increase insulin  glargine to 40 Units in the morning once daily in the morning  Increase insulin  lispro to 25 units twice a day before meals.   Continue all other medication the same.   Keep up the good work with diet and exercise. Aim for a diet full of vegetables, fruit and lean meats (chicken, turkey, fish). Try to limit salt intake by eating fresh or frozen vegetables (instead of canned), rinse canned vegetables prior to cooking and do not add any additional salt to meals.

## 2023-12-03 NOTE — Assessment & Plan Note (Signed)
 ASCVD risk - secondary prevention in patient with diabetes. Last LDL is at goal of <70 mg/dL. - -Continued Repatha  140 mg q14d and ezetimibe  10 mg daily

## 2023-12-05 ENCOUNTER — Other Ambulatory Visit: Payer: Self-pay | Admitting: Family Medicine

## 2023-12-05 DIAGNOSIS — E1165 Type 2 diabetes mellitus with hyperglycemia: Secondary | ICD-10-CM

## 2023-12-06 NOTE — Progress Notes (Signed)
 Reviewed and agree with Dr Rennis plan.

## 2023-12-07 ENCOUNTER — Ambulatory Visit: Payer: 59 | Admitting: Family Medicine

## 2023-12-07 ENCOUNTER — Encounter: Payer: Self-pay | Admitting: Family Medicine

## 2023-12-07 VITALS — BP 118/82 | HR 80 | Ht 64.0 in | Wt 177.0 lb

## 2023-12-07 DIAGNOSIS — G4733 Obstructive sleep apnea (adult) (pediatric): Secondary | ICD-10-CM | POA: Diagnosis not present

## 2023-12-08 NOTE — Progress Notes (Signed)
 Holly Acosta                                          MRN: [id number]   12/08/2023   The [facility] Quality Team Specialist reviewed this patient medical record for the purposes of chart review for care gap closure. The following were reviewed: chart review for care gap closure-glycemic status assessment.    [facility]BCI Quality Team

## 2023-12-24 ENCOUNTER — Other Ambulatory Visit: Payer: Self-pay | Admitting: General Practice

## 2023-12-29 ENCOUNTER — Ambulatory Visit: Payer: Self-pay | Admitting: Family Medicine

## 2023-12-29 ENCOUNTER — Ambulatory Visit: Admitting: Pharmacist

## 2023-12-30 NOTE — Progress Notes (Signed)
 BECKHAM BUXBAUM                                          MRN: [id number]   12/30/2023   The [facility]BCI Quality Team Specialist reviewed this patient medical record for the purposes of chart review for care gap closure. The following were reviewed: chart review for care gap closure-glycemic status assessment.    [facility]BCI Quality Team

## 2023-12-31 ENCOUNTER — Other Ambulatory Visit: Payer: Self-pay | Admitting: Cardiology

## 2023-12-31 DIAGNOSIS — E1169 Type 2 diabetes mellitus with other specified complication: Secondary | ICD-10-CM

## 2023-12-31 DIAGNOSIS — I25110 Atherosclerotic heart disease of native coronary artery with unstable angina pectoris: Secondary | ICD-10-CM

## 2024-01-08 ENCOUNTER — Ambulatory Visit: Admitting: Family Medicine

## 2024-01-08 ENCOUNTER — Ambulatory Visit: Admitting: Pharmacist

## 2024-01-12 ENCOUNTER — Encounter: Payer: Self-pay | Admitting: Family Medicine

## 2024-01-12 ENCOUNTER — Ambulatory Visit: Admitting: Family Medicine

## 2024-01-12 VITALS — BP 140/80 | HR 81 | Ht 64.0 in | Wt 176.2 lb

## 2024-01-12 DIAGNOSIS — E1122 Type 2 diabetes mellitus with diabetic chronic kidney disease: Secondary | ICD-10-CM

## 2024-01-12 DIAGNOSIS — Z794 Long term (current) use of insulin: Secondary | ICD-10-CM

## 2024-01-12 DIAGNOSIS — E1165 Type 2 diabetes mellitus with hyperglycemia: Secondary | ICD-10-CM | POA: Diagnosis not present

## 2024-01-12 DIAGNOSIS — E785 Hyperlipidemia, unspecified: Secondary | ICD-10-CM | POA: Diagnosis not present

## 2024-01-12 DIAGNOSIS — E119 Type 2 diabetes mellitus without complications: Secondary | ICD-10-CM | POA: Diagnosis not present

## 2024-01-12 DIAGNOSIS — N1831 Chronic kidney disease, stage 3a: Secondary | ICD-10-CM

## 2024-01-12 DIAGNOSIS — E1169 Type 2 diabetes mellitus with other specified complication: Secondary | ICD-10-CM

## 2024-01-12 LAB — POCT GLYCOSYLATED HEMOGLOBIN (HGB A1C): HbA1c, POC (controlled diabetic range): 14.3 % — AB (ref 0.0–7.0)

## 2024-01-12 MED ORDER — TRULICITY 0.75 MG/0.5ML ~~LOC~~ SOAJ: 0.7500 mg | SUBCUTANEOUS | 1 refills | Status: DC

## 2024-01-12 MED ORDER — INSULIN LISPRO (1 UNIT DIAL) 100 UNIT/ML (KWIKPEN): 30.0000 [IU] | PEN_INJECTOR | SUBCUTANEOUS | 3 refills | Status: AC

## 2024-01-12 MED ORDER — LANTUS SOLOSTAR 100 UNIT/ML ~~LOC~~ SOPN: 40.0000 [IU] | PEN_INJECTOR | Freq: Every day | SUBCUTANEOUS | 3 refills | Status: DC

## 2024-01-12 NOTE — Patient Instructions (Addendum)
 Good to see you today - Thank you for coming in  Things we discussed today:  Take 40 units of your long acting insulin . Take 30 units of your short acting insulin  twice a day with meals. I have sent Trulicity  in to be approved.   [facility] [address] [place], [place]  [address] [phone] - CALL FOR [facility]

## 2024-01-12 NOTE — Assessment & Plan Note (Signed)
 Uncontrolled, A1C increased 11.8 to 14.3 Increase Lantus  to 40 units daily, increase Humalog  to 30 units twice daily with meals Continue metformin  500 mg Start Trulicity  for better DM control.  Patient denies history of intolerance to this and states that it was no longer covered by insurance. She has an appointment with Dr. Koval next week.  I have scheduled her with me in 1 month.

## 2024-01-12 NOTE — Progress Notes (Signed)
" ° ° °  SUBJECTIVE:   CHIEF COMPLAINT / HPI:   Type 2 Diabetes: Home medications include: Lantus  35 units daily, Humalog  25 units twice daily before meals, metformin  500 mg daily. Does not endorse compliance-sometimes misses second short acting insulin  dose. Home glucose monitoring is performed regularly, she has not had any low readings, most of the time is in the high range.  GMI 10%. Patient is up to date on diabetic eye. Most recent A1Cs:  Lab Results  Component Value Date   HGBA1C 14.3 (A) 01/12/2024   HGBA1C 11.8 (A) 09/22/2023   Last Microalbumin, LDL, Creatinine: Lab Results  Component Value Date   MICROALBUR 3.1 (H) 03/30/2014   LDLCALC 21 10/01/2023   CREATININE 1.08 (H) 06/23/2023    PERTINENT  PMH / PSH: CAD, HTN, OSA, HLD, T2DM, CKD  OBJECTIVE:   BP (!) 140/80   Pulse 81   Ht 5' 4 (1.626 m)   Wt 176 lb 4 oz (79.9 kg)   SpO2 98%   BMI 30.25 kg/m   General: well appearing, NAD Cardiovascular: RRR, no m/r/g Respiratory: normal work of breathing on RA, CTAB  ASSESSMENT/PLAN:   Assessment & Plan Type 2 diabetes mellitus with stage 3a chronic kidney disease, with long-term current use of insulin  (HCC) Uncontrolled type 2 diabetes mellitus with hyperglycemia (HCC) Uncontrolled, A1C increased 11.8 to 14.3 Increase Lantus  to 40 units daily, increase Humalog  to 30 units twice daily with meals Continue metformin  500 mg Start Trulicity  for better DM control.  Patient denies history of intolerance to this and states that it was no longer covered by insurance. She has an appointment with Dr. Koval next week.  I have scheduled her with me in 1 month.     Elyce Prescott, DO [facility] "

## 2024-01-19 ENCOUNTER — Telehealth: Payer: Self-pay | Admitting: Pharmacist

## 2024-01-19 ENCOUNTER — Ambulatory Visit: Admitting: Pharmacist

## 2024-01-19 NOTE — Telephone Encounter (Signed)
 Attempted to contact patient for follow-up of missed appointment and request to reschedule.  Patient willing to reschedule 1/8 at 1:30 Plans to start Trulicity  (dulaglutide ) in the next 1-2 days.   Total time with patient call and documentation of interaction: 7 minutes.

## 2024-01-25 NOTE — Telephone Encounter (Signed)
 Reviewed and agree with Dr Rennis plan.

## 2024-01-26 NOTE — Progress Notes (Deleted)
 Cardiology Office Note   Date:  01/26/2024   ID:  Holly, Acosta 08-Nov-1955, MRN [id number]  PCP:  Stoney Blizzard, DO   Cardiologist: Dr. Monda Chastain  No chief complaint on file.    History of Present Illness: Holly Acosta is a 69 y.o. female who presents for ongoing assessment and management of coronary artery disease. She has a history of DM, HTN, HLD, and OSA. Cardiac cath in 2008 showed no significant CAD. She was admitted with a NSTEMI in April 2019.  Troponin > 65. Cardiac catheterization showed severe stenosis of the left circumflex requiring drug-eluting stent, currently on dual antiplatelet therapy with Brilinta  and aspirin .  Her hospital course was complication by an acute ischemic left cerebral infarct without frank hemorrhagic transformation and acute ischemic nonhemorrhagic right cerebellar infarct. The patient was transferred to skilled nursing facility for rehab.    She was admitted in January 2021 with Covid 19 PNA with hypoxic respiratory failure. Treated with decadron  and remdesivir . As a result of this sugars have been poorly controlled with A1c 13.6%. She was tried on Victoza  but couldn't tolerate it due to N/V.   She has had issues with elevated LFTs and statins discontinued. Now on Repatha  and Zetia . Also had some LE swelling and on last visit amlodipine  discontinued.   On follow up today she reports she is doing well. Denies any chest pain or SOB. No palpitations. No TIA or CVA symptoms. Walks with a walker. Notes sugars have been better since insulin  adjusted. Now on high dose Crestor , Zetia  and fish oil for hyperlipidemia.   Past Medical History:  Diagnosis Date   Acute cerebrovascular accident (CVA) of cerebellum (HCC) 04/29/2017   Acute cerebrovascular accident (CVA) of cerebellum (HCC) 04/29/2017   Acute respiratory failure with hypoxia (HCC)    AKI (acute kidney injury)    Anemia    Arthritis of knee    both knees (04/24/2017)   Cataract    Cerebrovascular  accident (CVA) due to occlusion of left posterior cerebral artery (HCC) 12/03/2017   Chest pain 04/24/2017   COLONIC POLYPS, HYPERPLASTIC 09/13/2009   Qualifier: Diagnosis of  By: Adella MD, Elizabeth     Constipation 06/02/2017   Coronary artery disease involving native coronary artery of native heart with unstable angina pectoris (HCC) 04/30/2017   Dehydration    Diabetes mellitus, type 2 (HCC)    Excessive daytime sleepiness 12/03/2017   History of blood transfusion    related to prolapsed uterus   HLD (hyperlipidemia)    HTN (hypertension)    Non-ST elevation (NSTEMI) myocardial infarction (HCC)    Non-ST elevation (NSTEMI) myocardial infarction (HCC)    Pneumonia due to COVID-19 virus 02/18/2019   Prolapsed uterus    Severe obstructive sleep apnea-hypopnea syndrome 01/01/2018   Sleep apnea    have mask; haven't worn it for a long time (04/24/2017)   Status post coronary artery stent placement    Umbilical hernia    Uterine prolapse 01/20/2006   Qualifier: Diagnosis of   By: Adella MD, Almarie Parker hernia 01/26/2009   Qualifier: Diagnosis of   By: Adella MD, Elizabeth      Replacing diagnoses that were inactivated after the 04/21/22 regulatory import      Past Surgical History:  Procedure Laterality Date   CARDIAC CATHETERIZATION  2011   COLONOSCOPY     CORONARY STENT INTERVENTION N/A 04/27/2017   Procedure: CORONARY STENT INTERVENTION;  Surgeon: Dann Candyce RAMAN,  MD;  Location: [facility] INVASIVE CV LAB;  Service: Cardiovascular;  Laterality: N/A;   LEFT HEART CATH AND CORONARY ANGIOGRAPHY N/A 04/27/2017   Procedure: LEFT HEART CATH AND CORONARY ANGIOGRAPHY;  Surgeon: Dann Candyce RAMAN, MD;  [facility] INVASIVE CV LAB;  Service: Cardiovascular;  Laterality: N/A;   TUBAL LIGATION       Current Outpatient Medications  Medication Sig Dispense Refill   ACCU-CHEK GUIDE TEST test strip PLEASE USE TO CHECK BLOOD SUGAR UP TO THREE TIMES DAILY. 300 strip 11    amLODipine  (NORVASC ) 5 MG tablet Take 1 tablet (5 mg total) by mouth at bedtime. 30 tablet 12   aspirin  EC 81 MG tablet Take 1 tablet (81 mg total) by mouth daily. 90 tablet 3   carvedilol  (COREG ) 12.5 MG tablet TAKE 1.5 TABLETS (18.75 MG TOTAL) BY MOUTH 2 (TWO) TIMES DAILY WITH A MEAL. 270 tablet 1   Continuous Glucose Sensor (FREESTYLE LIBRE 3 PLUS SENSOR) MISC Change sensor every 15 days. 2 each 11   [employer] VITAMIN B12 1000 MCG tablet TAKE 1 TABLET BY MOUTH EVERY DAY 90 tablet 1   Dulaglutide  (TRULICITY ) 0.75 MG/0.5ML SOAJ Inject 0.75 mg into the skin once a week. 2 mL 1   Evolocumab  (REPATHA  SURECLICK) 140 MG/ML SOAJ INJECT 140 MG INTO THE SKIN EVERY 14 (FOURTEEN) DAYS. 6 mL 1   ezetimibe  (ZETIA ) 10 MG tablet TAKE 1 TABLET BY MOUTH EVERY DAY 90 tablet 3   ferrous sulfate  325 (65 FE) MG tablet TAKE 1 TABLET BY MOUTH EVERY DAY WITH BREAKFAST 90 tablet 1   insulin  glargine (LANTUS  SOLOSTAR) 100 UNIT/ML Solostar Pen Inject 40 Units into the skin daily. INJECT 40 UNITS INTO SKIN DAILY 15 mL 3   insulin  lispro (HUMALOG  KWIKPEN) 100 UNIT/ML KwikPen Inject 30 Units into the skin See admin instructions. Inject 30 units twice daily before meals. 15 mL 3   Insulin  Pen Needle (BD PEN NEEDLE MINI ULTRAFINE) 31G X 5 MM MISC USE AS DIRECTED 3 TIMES A DAY 100 each 2   metFORMIN  (GLUCOPHAGE -XR) 500 MG 24 hr tablet TAKE 1 TABLET BY MOUTH EVERY DAY WITH BREAKFAST 90 tablet 1   olmesartan -hydrochlorothiazide  (BENICAR  HCT) 20-12.5 MG tablet TAKE 1 TABLET BY MOUTH EVERY DAY 90 tablet 3   Omega-3 Fatty Acids (FISH OIL) 1000 MG CAPS Take 1,000 mg by mouth daily.     No current facility-administered medications for this visit.    Allergies:   Lipitor [atorvastatin ] and Diclofenac     Social History:  The patient  reports that she quit smoking about 17 years ago. Her smoking use included cigarettes. She started smoking about 51 years ago. She has a 33.8 pack-year smoking history. She has never used smokeless tobacco.  She reports that she does not drink alcohol and does not use drugs.   Family History:  The patient's family history includes Diabetes in her mother; Hypertension in her father and mother.    ROS: All other systems are reviewed and negative. Unless otherwise mentioned in H&P    PHYSICAL EXAM: VS:  There were no vitals taken for this visit. , BMI There is no height or weight on file to calculate BMI. GEN: Well nourished, well developed, in no acute distress  HEENT: normal  Neck: no JVD, carotid bruits, or masses Cardiac: RRR; no murmurs, rubs, or gallops,no edema  Respiratory:  clear to auscultation bilaterally, normal work of breathing GI: soft, nontender, nondistended, + BS MS: no deformity or atrophy  Skin: warm and dry,  no rash Neuro:  Strength and sensation are intact Psych: euthymic mood, full affect   EKG:  Today shows NSR rate 65. LAD, LVH by voltage. I have personally reviewed and interpreted this study.   Recent Labs: 04/03/2023: Magnesium  1.9 06/23/2023: ALT 28; BUN 18; Creatinine, Ser 1.08; Hemoglobin 13.6; Platelets 234; Potassium 4.0; Sodium 132  02/18/19: A1c 13.6%  Lipid Panel    Component Value Date/Time   CHOL 95 (L) 10/01/2023 1556   TRIG 239 (H) 10/01/2023 1556   HDL 38 (L) 10/01/2023 1556   CHOLHDL 2.5 10/01/2023 1556   CHOLHDL 5.1 04/29/2017 0557   VLDL 32 04/29/2017 0557   LDLCALC 21 10/01/2023 1556   LDLDIRECT 135 (H) 12/31/2012 0949      Wt Readings from Last 3 Encounters:  01/12/24 176 lb 4 oz (79.9 kg)  12/07/23 177 lb (80.3 kg)  12/03/23 175 lb (79.4 kg)      Other studies Reviewed:  Cardiac Cath 04/27/2017     Prox RCA lesion is 25% stenosed. Mid RCA to Dist RCA lesion is 25% stenosed. Prox LAD lesion is 25% stenosed. The left ventricular systolic function is normal. LV end diastolic pressure is mildly elevated. LVEDP 18 mm Hg. The left ventricular ejection fraction is 55-65% by visual estimate. There is no aortic valve  stenosis. Prox Cx lesion is 99% stenosed. This was the culprit lesion. A drug-eluting stent was successfully placed using a STENT SIERRA 3.50 X 18 MM. Post intervention, there is a 0% residual stenosis.   Continue dual antiplatelet therapy for at least one year with aggressive secondary prevention.        Echocardiogram 04/29/2017  Left ventricle: The cavity size was normal. There was moderate   concentric hypertrophy. Systolic function was normal. The   estimated ejection fraction was in the range of 55% to 60%.   Doppler parameters are consistent with abnormal left ventricular   relaxation (grade 1 diastolic dysfunction). There was no evidence   of elevated ventricular filling pressure by Doppler parameters. - Mitral valve: There was no regurgitation. Valve area by pressure   half-time: 2.08 cm^2. - Left atrium: The atrium was normal in size. - Right ventricle: Systolic function was normal. - Right atrium: The atrium was normal in size. - Tricuspid valve: There was no regurgitation. - Pericardium, extracardiac: The pericardium was normal in   appearance.   Impressions:   - There is hypokinesis of the basal inferior, inferolateral and mid   inferolateral walls, overall LVEF is preserved at 55-60%.    ASSESSMENT AND PLAN:  1.  CAD: S/P NSTEMI April 2019 - s/p DES of LCx.  Continue ASA 81 mg daily.   Continue Coreg  and ACEi. She is asymptomatic.   2. CVA: Bilateral cerebral non-hemmorhagic infarcts- embolic following cardiac cath/PCI. Improved.   3. Hypercholesterolemia- combined: on high dose Crestor , Zetia , fish oil. Plan follow up labs with PCP. If LDL still not at goal < 70 would consider addition of PCSK 9 inhibitor like Repatha .   4. Diabetes: poorly controlled. On metformin , Farxiga  and lantus  and Novolog  insulin . Per primary care.   5. OSA. Reports she is using CPAP.  Follow up in one year  Emett Stapel MD, [facility]     01/26/2024 1:10 PM    [facility]

## 2024-01-27 ENCOUNTER — Ambulatory Visit: Admitting: Cardiology

## 2024-01-28 ENCOUNTER — Ambulatory Visit: Admitting: Pharmacist

## 2024-01-28 ENCOUNTER — Encounter: Payer: Self-pay | Admitting: Pharmacist

## 2024-01-28 VITALS — BP 144/71 | HR 71 | Wt 175.0 lb

## 2024-01-28 DIAGNOSIS — Z794 Long term (current) use of insulin: Secondary | ICD-10-CM | POA: Diagnosis not present

## 2024-01-28 DIAGNOSIS — E785 Hyperlipidemia, unspecified: Secondary | ICD-10-CM | POA: Diagnosis not present

## 2024-01-28 DIAGNOSIS — E1159 Type 2 diabetes mellitus with other circulatory complications: Secondary | ICD-10-CM | POA: Diagnosis not present

## 2024-01-28 DIAGNOSIS — E1169 Type 2 diabetes mellitus with other specified complication: Secondary | ICD-10-CM | POA: Diagnosis not present

## 2024-01-28 DIAGNOSIS — E1165 Type 2 diabetes mellitus with hyperglycemia: Secondary | ICD-10-CM | POA: Diagnosis not present

## 2024-01-28 DIAGNOSIS — N1831 Chronic kidney disease, stage 3a: Secondary | ICD-10-CM | POA: Diagnosis not present

## 2024-01-28 DIAGNOSIS — E119 Type 2 diabetes mellitus without complications: Secondary | ICD-10-CM

## 2024-01-28 DIAGNOSIS — E1122 Type 2 diabetes mellitus with diabetic chronic kidney disease: Secondary | ICD-10-CM | POA: Diagnosis not present

## 2024-01-28 DIAGNOSIS — I152 Hypertension secondary to endocrine disorders: Secondary | ICD-10-CM

## 2024-01-28 MED ORDER — LANTUS SOLOSTAR 100 UNIT/ML ~~LOC~~ SOPN: 50.0000 [IU] | PEN_INJECTOR | Freq: Every day | SUBCUTANEOUS | Status: AC

## 2024-01-28 NOTE — Assessment & Plan Note (Signed)
-   Secondary prevention in patient with diabetes. LDL is at goal of < 70 mg/dL.  - Continued ezetimibe  10 mg daily.  - Possible muskoskeletal complaint due to Repatha . Will defer to cardiology for further evaluation.

## 2024-01-28 NOTE — Assessment & Plan Note (Signed)
 Hypertension longstanding currently controlled. Blood pressure goal of <130/80 mmHg. Medication adherence fair.  - Continued amlodipine  5 mg daily. - Continued olmesartan -hydrochlorothiazide  20-12.5 mg daily.

## 2024-01-28 NOTE — Assessment & Plan Note (Signed)
 Diabetes longstanding since 2009 currently uncontrolled. GMI today 11.5%. Medication adherence appears fair-poor. Control is suboptimal due to non-adherence and diet. - Increased dose of basal insulin  Lantus  (insulin  glargine) from 40 units to 50 units daily in the morning.   - Continued rapid insulin  Humalog  (insulin  lispro) 30 units twice daily before meals.  - Continued GLP-1 Trulicity  (dulaglutide ) 0.75 mg weekly.  - Continued metformin  500 mg daily.  - Patient educated on purpose, proper use, and potential adverse effects.  - Extensively discussed pathophysiology of diabetes, recommended lifestyle interventions, dietary effects on glucose control.  - Counseled on s/sx of and management of hypoglycemia.

## 2024-01-28 NOTE — Patient Instructions (Addendum)
 It was nice to see you today!   Your goal glucose value is 80-130 before eating and less than 180 after eating.  Medication Changes:  Increase Lantus  (insulin  glargine) to 50 units in the morning.   Continue all other medication the same.   Keep up the good work with diet and exercise. Aim for a diet full of vegetables, fruit and lean meats (chicken, turkey, fish). Try to limit salt intake by eating fresh or frozen vegetables (instead of canned), rinse canned vegetables prior to cooking and do not add any additional salt to meals.   Monitor your glucose at home and keep a log (glucometer or piece of paper) to bring with you to your next visit.  Please bring all medications to your clinic visit with Dr. Jacqulyne on 02/12/2024.   Please arrive 10-15 minutes prior to your scheduled visit time.

## 2024-01-28 NOTE — Progress Notes (Signed)
 "   S:     Chief Complaint  Patient presents with   Medication Management    Diabetes    69 y.o. female who presents for diabetes evaluation, education, and management. Patient arrives in good spirits and presents with assistance from roller walker.   Patient was referred and last seen by Primary Care Provider, Dr. Jacqulyne, on 01/12/24. At last visit, the patient's dose of Humalog  (insulin  lispro) and Lantus  (insulin  glargine) were increased and the patient was started on Trulicity  (dulaglutide ).   PMH is significant for T2DM, hypertension, osteoarthritis, hyperlipidemia, depression, and CAD.   Family/Social History: lives with granddaughter that works as patent examiner every other week  Current diabetes medications include: Lantus  (insulin  glargine) 40 units daily, Humalog  (insulin  lispro) 30 units with meals, Trulicity  (dulaglutide ) 0.75 mg weekly  Current hypertension medications include: olmesartan -hydrochlorothiazide  20-12.5 mg daily, carvedilol  18.75 mg BID  Current hyperlipidemia medications include: ezetimibe  10 mg daily, Repatha  140 mg every 14 days   Reports Repatha  is making top of her feet and toes sore.   Patient denies adherence with medications, reports taking Humalog  (insulin  lispro)  once daily.   Patient denies hypoglycemic events.  Patient reported dietary habits: Reports snacking on chips between meals. Reports drinking Powerade and making her own sweet tea that she puts ~1 cup of sugar into.   O:   Review of Systems  All other systems reviewed and are negative.   Physical Exam Constitutional:      Appearance: Normal appearance.  Pulmonary:     Effort: Pulmonary effort is normal.  Neurological:     Mental Status: She is alert.  Psychiatric:        Mood and Affect: Mood normal.        Behavior: Behavior normal.        Thought Content: Thought content normal.        Judgment: Judgment normal.    Libre3 CGM Download today from  01/15/2024-01/28/2024 % Time CGM is active: 73% Average Glucose: 341 mg/dL Glucose Management Indicator: 11.5%  Glucose Variability: 17.8% (goal <36%) Time in Goal:  - Time in range 70-180: 0% - Time above range: 100% - Time below range: 0% Observed patterns:  Lab Results  Component Value Date   HGBA1C 14.3 (A) 01/12/2024   Vitals:   01/28/24 1345 01/28/24 1346  BP: (!) 142/73 (!) 144/71  Pulse: 71   SpO2: 96%     Lipid Panel     Component Value Date/Time   CHOL 95 (L) 10/01/2023 1556   TRIG 239 (H) 10/01/2023 1556   HDL 38 (L) 10/01/2023 1556   CHOLHDL 2.5 10/01/2023 1556   CHOLHDL 5.1 04/29/2017 0557   VLDL 32 04/29/2017 0557   LDLCALC 21 10/01/2023 1556   LDLDIRECT 135 (H) 12/31/2012 0949    Clinical Atherosclerotic Cardiovascular Disease (ASCVD): Yes  The ASCVD Risk score (Arnett DK, et al., 2019) failed to calculate for the following reasons:   Risk score cannot be calculated because patient has a medical history suggesting prior/existing ASCVD   * - Cholesterol units were assumed  Lab Results  Component Value Date   CHOL 95 (L) 10/01/2023   HDL 38 (L) 10/01/2023   LDLCALC 21 10/01/2023   LDLDIRECT 135 (H) 12/31/2012   TRIG 239 (H) 10/01/2023   CHOLHDL 2.5 10/01/2023    Lab Results  Component Value Date   CREATININE 1.08 (H) 06/23/2023   BUN 18 06/23/2023   NA 132 (L) 06/23/2023   K 4.0  06/23/2023   CL 98 06/23/2023   CO2 27 06/23/2023    Medications Reviewed Today     Reviewed by Pablo Mathurin G, RPH-CPP (Pharmacist) on 01/28/24 at 1344  Med List Status: <None>   Medication Order Taking? Sig Documenting Provider Last Dose Status Informant  ACCU-CHEK GUIDE TEST test strip [id number]  PLEASE USE TO CHECK BLOOD SUGAR UP TO THREE TIMES DAILY. Everhart, Kirstie, DO  Active   amLODipine  (NORVASC ) 5 MG tablet [id number] Yes Take 1 tablet (5 mg total) by mouth at bedtime. Everhart, Kirstie, DO  Active   aspirin  EC 81 MG tablet [id number] Yes Take 1 tablet (81  mg total) by mouth daily. Alto Lynwood SAUNDERS, MD  Active Self, Pharmacy Records  carvedilol  (COREG ) 12.5 MG tablet [id number] Yes TAKE 1.5 TABLETS (18.75 MG TOTAL) BY MOUTH 2 (TWO) TIMES DAILY WITH A MEAL. Court Dorn PARAS, MD  Active   Continuous Glucose Sensor (FREESTYLE LIBRE 3 PLUS SENSOR) [place] [id number]  Change sensor every 15 days. McDiarmid, Krystal BIRCH, MD  Active   [facility] VITAMIN B12 1000 MCG tablet [id number] Yes TAKE 1 TABLET BY MOUTH EVERY DAY Everhart, Kirstie, DO  Active   Dulaglutide  (TRULICITY ) 0.75 MG/0.5ML SOAJ [id number] Yes Inject 0.75 mg into the skin once a week. Everhart, Kirstie, DO  Active   Evolocumab  (REPATHA  SURECLICK) 140 MG/ML SOAJ [id number] Yes INJECT 140 MG INTO THE SKIN EVERY 14 (FOURTEEN) DAYS. Jordan, Apollos Tenbrink M, MD  Active   ezetimibe  (ZETIA ) 10 MG tablet [id number] Yes TAKE 1 TABLET BY MOUTH EVERY DAY Emelia Josefa HERO, NP  Active   ferrous sulfate  325 (65 FE) MG tablet [id number] Yes TAKE 1 TABLET BY MOUTH EVERY DAY WITH BREAKFAST Everhart, Kirstie, DO  Active   insulin  glargine (LANTUS  SOLOSTAR) 100 UNIT/ML Solostar Pen [id number] Yes Inject 40 Units into the skin daily. INJECT 40 UNITS INTO SKIN DAILY Everhart, Kirstie, DO  Active   insulin  lispro (HUMALOG  KWIKPEN) 100 UNIT/ML KwikPen [id number] Yes Inject 30 Units into the skin See admin instructions. Inject 30 units twice daily before meals. Everhart, Kirstie, DO  Active   Insulin  Pen Needle (BD PEN NEEDLE MINI ULTRAFINE) 31G X 5 MM MISC [id number]  USE AS DIRECTED 3 TIMES A DAY McDiarmid, Krystal BIRCH, MD  Active   metFORMIN  (GLUCOPHAGE -XR) 500 MG 24 hr tablet [id number] Yes TAKE 1 TABLET BY MOUTH EVERY DAY WITH BREAKFAST Everhart, Kirstie, DO  Active   olmesartan -hydrochlorothiazide  (BENICAR  HCT) 20-12.5 MG tablet [id number] Yes TAKE 1 TABLET BY MOUTH EVERY DAY Everhart, Kirstie, DO  Active   Omega-3 Fatty Acids (FISH OIL) 1000 MG CAPS [id number] Yes Take 1,000 mg by mouth daily. [provider]  Active Self, Pharmacy Records            Patient is participating in a Managed Medicaid Plan:  Yes   A/P: Diabetes longstanding since 2009 currently uncontrolled. GMI today 11.5%. Medication adherence appears fair-poor. Control is suboptimal due to non-adherence and diet. - Increased dose of basal insulin  Lantus  (insulin  glargine) from 40 units to 50 units daily in the morning.   - Continued rapid insulin  Humalog  (insulin  lispro) 30 units twice daily before meals.  - Continued GLP-1 Trulicity  (dulaglutide ) 0.75 mg weekly.  - Continued metformin  500 mg daily.  - Patient educated on purpose, proper use, and potential adverse effects.  - Extensively discussed pathophysiology of diabetes, recommended lifestyle interventions, dietary effects on glucose control.  - Counseled on s/sx of and management of hypoglycemia.   ASCVD risk  - Secondary  prevention in patient with diabetes. LDL is at goal of < 70 mg/dL.  - Continued ezetimibe  10 mg daily.  - Possible muskoskeletal complaint due to Repatha . Will defer to cardiology for further evaluation.   Hypertension longstanding currently controlled. Blood pressure goal of <130/80 mmHg. Medication adherence fair.  - Continued amlodipine  5 mg daily. - Continued olmesartan -hydrochlorothiazide  20-12.5 mg daily.   Written patient instructions provided. Patient verbalized understanding of treatment plan.  Total time in face to face counseling 39 minutes.    Follow-up:  Pharmacist visit TBD PCP clinic visit 02/12/2024 Patient seen with Sabra Schuller, PharmD Candidate - PY2 student and Megan McGill, PharmD Candidate - PY4 student. SABRA  "

## 2024-01-29 NOTE — Progress Notes (Signed)
 Reviewed and agree with Dr Rennis plan.

## 2024-01-31 ENCOUNTER — Other Ambulatory Visit: Payer: Self-pay | Admitting: Family Medicine

## 2024-02-02 ENCOUNTER — Other Ambulatory Visit: Payer: Self-pay | Admitting: Family Medicine

## 2024-02-02 DIAGNOSIS — E538 Deficiency of other specified B group vitamins: Secondary | ICD-10-CM

## 2024-02-12 ENCOUNTER — Ambulatory Visit: Payer: Self-pay | Admitting: Family Medicine

## 2024-02-12 ENCOUNTER — Encounter: Payer: Self-pay | Admitting: Family Medicine

## 2024-02-12 VITALS — BP 142/80 | HR 78 | Ht 64.5 in | Wt 177.8 lb

## 2024-02-12 DIAGNOSIS — N1831 Chronic kidney disease, stage 3a: Secondary | ICD-10-CM

## 2024-02-12 DIAGNOSIS — Z794 Long term (current) use of insulin: Secondary | ICD-10-CM

## 2024-02-12 DIAGNOSIS — E1159 Type 2 diabetes mellitus with other circulatory complications: Secondary | ICD-10-CM

## 2024-02-12 DIAGNOSIS — I152 Hypertension secondary to endocrine disorders: Secondary | ICD-10-CM

## 2024-02-12 DIAGNOSIS — E1122 Type 2 diabetes mellitus with diabetic chronic kidney disease: Secondary | ICD-10-CM | POA: Diagnosis not present

## 2024-02-12 MED ORDER — OLMESARTAN MEDOXOMIL-HCTZ 40-12.5 MG PO TABS: 1.0000 | ORAL_TABLET | Freq: Every day | ORAL | 3 refills | Status: AC

## 2024-02-12 NOTE — Assessment & Plan Note (Signed)
 Diabetes managed with insulin  therapy. Trulicity  self-discontinued due to diarrhea. Blood glucose monitoring challenging due to arm soreness. - Increased long-acting insulin  to 50 units daily by Dr. Koval, encouraged pt to start this  - Continued short-acting insulin  as currently taken (30U BID) - Continued Metformin  500 mg daily. - Encouraged continued use of blood glucose monitoring device. - Ordered BMP - f/u scheduled 1 month

## 2024-02-12 NOTE — Assessment & Plan Note (Signed)
 Management complicated by leg swelling, pt suspect from amlodipine . BP elevated today at 142/80 - Discontinued amlodipine . - Increased olmesartan  hydrochlorothiazide  dosage to 40-12.5mg  - f/u 1 month

## 2024-02-12 NOTE — Patient Instructions (Addendum)
 Increase your long-acting insulin  to 50 units daily in the morning.  Continue your short acting insulin  30 units with meals. Since amlodipine  was causing swelling, we can stop this.  I am increasing your other blood pressure medicine, olmesartan  hydrochlorothiazide .  We will check some lab work today.  I will see you in 1 month with Dr. Koval  [phone] call this number to schedule your mammogram

## 2024-02-12 NOTE — Progress Notes (Signed)
 EDNA REDE                                          MRN: [id number]   02/12/2024   The [facility] Quality Team Specialist reviewed this patient medical record for the purposes of chart review for care gap closure. The following were reviewed: chart review for care gap closure-glycemic status assessment.    [facility] Quality Team

## 2024-02-12 NOTE — Progress Notes (Signed)
" ° ° °  SUBJECTIVE:   CHIEF COMPLAINT / HPI: DM f/u  Discussed the use of AI scribe software for clinical note transcription with the patient, who gave verbal consent to proceed.  History of Present Illness Holly Acosta is a 69 year old female with diabetes and hypertension who presents for medication management.  Glycemic control and diabetes management - Insulin  regimen includes 40 units of long-acting insulin  and 30 units of short-acting insulin , administered at least twice daily, sometimes three times daily with three meals. - Metformin  500 mg daily. - Previously trialed Trulicity , discontinued due to diarrhea. - Not currently using continuous glucose monitor due to arm soreness; considering returning to fingerstick blood glucose monitoring.  Lipid abnormalities - Recent laboratory results show normal LDL cholesterol. - Triglycerides are elevated.  Hypertension and antihypertensive therapy - Currently taking amlodipine  and olmesartan  hydrochlorothiazide . - Experiences leg swelling, particularly unilateral, attributed to amlodipine . - Takes antihypertensive medications at night to avoid daytime diuresis.  Mobility and functional status - Drives independently to appointments. - Requires assistance from granddaughter for locations that are difficult to access.   PERTINENT  PMH / PSH: CAD, hypertension, OSA, hyperlipidemia, type 2 diabetes, CKD 3  OBJECTIVE:   BP (!) 142/80   Pulse 78   Ht 5' 4.5 (1.638 m)   Wt 177 lb 12.8 oz (80.6 kg)   SpO2 100%   BMI 30.05 kg/m    General: well appearing, NAD, hard of hearing Cardiovascular: RRR, no m/r/g Respiratory: normal work of breathing on RA, CTAB Extremities: No swelling BLE. Ambulates with walker  ASSESSMENT/PLAN:   Assessment & Plan Type 2 diabetes mellitus with stage 3a chronic kidney disease, with long-term current use of insulin  (HCC) Chronic kidney disease, stage 3a (HCC) Diabetes managed with insulin  therapy.  Trulicity  self-discontinued due to diarrhea. Blood glucose monitoring challenging due to arm soreness. - Increased long-acting insulin  to 50 units daily by Dr. Koval, encouraged pt to start this  - Continued short-acting insulin  as currently taken (30U BID) - Continued Metformin  500 mg daily. - Encouraged continued use of blood glucose monitoring device. - Ordered BMP - f/u scheduled 1 month Hypertension associated with diabetes (HCC) Management complicated by leg swelling, pt suspect from amlodipine . BP elevated today at 142/80 - Discontinued amlodipine . - Increased olmesartan  hydrochlorothiazide  dosage to 40-12.5mg  - f/u 1 month   Dr. Elyce Prescott, DO [facility]   "

## 2024-02-13 LAB — BASIC METABOLIC PANEL WITH GFR
BUN/Creatinine Ratio: 15 (ref 12–28)
BUN: 18 mg/dL (ref 8–27)
CO2: 21 mmol/L (ref 20–29)
Calcium: 9.7 mg/dL (ref 8.7–10.3)
Chloride: 95 mmol/L — ABNORMAL LOW (ref 96–106)
Creatinine, Ser: 1.2 mg/dL — ABNORMAL HIGH (ref 0.57–1.00)
Glucose: 475 mg/dL — ABNORMAL HIGH (ref 70–99)
Potassium: 4.7 mmol/L (ref 3.5–5.2)
Sodium: 132 mmol/L — ABNORMAL LOW (ref 134–144)
eGFR: 49 mL/min/{1.73_m2} — ABNORMAL LOW

## 2024-02-16 ENCOUNTER — Ambulatory Visit: Payer: Self-pay | Admitting: Family Medicine

## 2024-02-19 ENCOUNTER — Other Ambulatory Visit: Payer: Self-pay | Admitting: Family Medicine

## 2024-02-19 ENCOUNTER — Encounter: Payer: Self-pay | Admitting: *Deleted

## 2024-02-19 DIAGNOSIS — D508 Other iron deficiency anemias: Secondary | ICD-10-CM

## 2024-02-19 NOTE — Progress Notes (Signed)
 Holly Acosta                                          MRN: [id number]   02/19/2024   The [facility] Quality Team Specialist reviewed this patient medical record for the purposes of chart review for care gap closure. The following were reviewed: chart review for care gap closure-glycemic status assessment.    [facility] Quality Team

## 2024-02-22 ENCOUNTER — Encounter: Payer: Self-pay | Admitting: Pharmacist

## 2024-02-22 NOTE — Progress Notes (Addendum)
 Pharmacy Quality Measure Review  This patient is appearing on the insurance-providing list for being at risk of failing the adherence measure for Statin Therapy for Patients with Cardiovascular Disease (SPC) and Statin Use in Persons with Diabetes (SUPD) medications this calendar year.  Per review of chart and payor information, patient has a diagnosis of cardiovascular disease but is not currently filling a statin prescription.   Per review of chart and payor information, patient has filled a medication indicated for diabetes this calendar year but is not currently filling a statin prescription.    Patient has history of foot cramping with atorvastatin  Patient currently taking evolocumab  and ezetimibe .  LDL - 21  on 10/01/2023  Appropriate therapy - not myopathy or myalgia adverse effect - will fail SUPD  [facility]

## 2024-03-07 ENCOUNTER — Ambulatory Visit: Admitting: Cardiology

## 2024-03-08 ENCOUNTER — Ambulatory Visit: Admitting: Pharmacist

## 2024-03-11 ENCOUNTER — Ambulatory Visit: Payer: Self-pay | Admitting: Family Medicine

## 2024-07-25 ENCOUNTER — Encounter

## 2024-12-26 ENCOUNTER — Ambulatory Visit: Admitting: Family Medicine
